# Patient Record
Sex: Male | Born: 1950
Health system: Southern US, Community
[De-identification: ages and names within clinical notes are randomized; demographics above are authoritative.]

## PROBLEM LIST (undated history)

## (undated) DIAGNOSIS — E669 Obesity, unspecified: Secondary | ICD-10-CM

## (undated) DIAGNOSIS — T7840XA Allergy, unspecified, initial encounter: Secondary | ICD-10-CM

## (undated) DIAGNOSIS — G4730 Sleep apnea, unspecified: Secondary | ICD-10-CM

## (undated) DIAGNOSIS — Z8679 Personal history of other diseases of the circulatory system: Secondary | ICD-10-CM

## (undated) DIAGNOSIS — N390 Urinary tract infection, site not specified: Secondary | ICD-10-CM

## (undated) DIAGNOSIS — M797 Fibromyalgia: Secondary | ICD-10-CM

## (undated) DIAGNOSIS — I951 Orthostatic hypotension: Secondary | ICD-10-CM

## (undated) DIAGNOSIS — R55 Syncope and collapse: Secondary | ICD-10-CM

## (undated) DIAGNOSIS — E119 Type 2 diabetes mellitus without complications: Secondary | ICD-10-CM

## (undated) DIAGNOSIS — I10 Essential (primary) hypertension: Secondary | ICD-10-CM

## (undated) DIAGNOSIS — R42 Dizziness and giddiness: Secondary | ICD-10-CM

## (undated) DIAGNOSIS — Z7901 Long term (current) use of anticoagulants: Secondary | ICD-10-CM

## (undated) DIAGNOSIS — Z86718 Personal history of other venous thrombosis and embolism: Secondary | ICD-10-CM

## (undated) DIAGNOSIS — H269 Unspecified cataract: Secondary | ICD-10-CM

## (undated) DIAGNOSIS — K8010 Calculus of gallbladder with chronic cholecystitis without obstruction: Secondary | ICD-10-CM

## (undated) DIAGNOSIS — N183 Chronic kidney disease, stage 3 unspecified: Secondary | ICD-10-CM

## (undated) DIAGNOSIS — I509 Heart failure, unspecified: Secondary | ICD-10-CM

## (undated) DIAGNOSIS — G629 Polyneuropathy, unspecified: Secondary | ICD-10-CM

## (undated) DIAGNOSIS — M069 Rheumatoid arthritis, unspecified: Secondary | ICD-10-CM

## (undated) DIAGNOSIS — Z86010 Personal history of colonic polyps: Secondary | ICD-10-CM

## (undated) DIAGNOSIS — I428 Other cardiomyopathies: Secondary | ICD-10-CM

## (undated) DIAGNOSIS — J449 Chronic obstructive pulmonary disease, unspecified: Secondary | ICD-10-CM

## (undated) DIAGNOSIS — R9431 Abnormal electrocardiogram [ECG] [EKG]: Secondary | ICD-10-CM

## (undated) DIAGNOSIS — M519 Unspecified thoracic, thoracolumbar and lumbosacral intervertebral disc disorder: Secondary | ICD-10-CM

## (undated) DIAGNOSIS — K529 Noninfective gastroenteritis and colitis, unspecified: Secondary | ICD-10-CM

## (undated) DIAGNOSIS — K8020 Calculus of gallbladder without cholecystitis without obstruction: Secondary | ICD-10-CM

## (undated) DIAGNOSIS — E860 Dehydration: Secondary | ICD-10-CM

## (undated) DIAGNOSIS — N200 Calculus of kidney: Secondary | ICD-10-CM

## (undated) HISTORY — DX: Chronic obstructive pulmonary disease, unspecified: J44.9

## (undated) HISTORY — DX: Long term (current) use of anticoagulants: Z79.01

## (undated) HISTORY — DX: Other cardiomyopathies: I42.8

## (undated) HISTORY — DX: Chronic kidney disease, stage 3 unspecified: N18.30

## (undated) HISTORY — DX: Sleep apnea, unspecified: G47.30

## (undated) HISTORY — PX: TOE AMPUTATION: SHX809

## (undated) HISTORY — DX: Unspecified thoracic, thoracolumbar and lumbosacral intervertebral disc disorder: M51.9

## (undated) HISTORY — DX: Essential (primary) hypertension: I10

## (undated) HISTORY — PX: EYE SURGERY: SHX253

## (undated) HISTORY — DX: Obesity, unspecified: E66.9

## (undated) HISTORY — DX: Chronic kidney disease, stage 3 (moderate): N18.3

## (undated) HISTORY — DX: Urinary tract infection, site not specified: N39.0

## (undated) HISTORY — DX: Personal history of other venous thrombosis and embolism: Z86.718

## (undated) HISTORY — DX: Calculus of kidney: N20.0

## (undated) HISTORY — DX: Personal history of colonic polyps: Z86.010

## (undated) HISTORY — PX: OTHER SURGICAL HISTORY: SHX169

## (undated) HISTORY — DX: Calculus of gallbladder without cholecystitis without obstruction: K80.20

## (undated) HISTORY — PX: VARICOSE VEIN SURGERY: SHX832

## (undated) HISTORY — DX: Abnormal electrocardiogram (ECG) (EKG): R94.31

## (undated) HISTORY — DX: Rheumatoid arthritis, unspecified: M06.9

## (undated) HISTORY — DX: Calculus of gallbladder with chronic cholecystitis without obstruction: K80.10

## (undated) HISTORY — DX: Dehydration: E86.0

## (undated) HISTORY — DX: Heart failure, unspecified: I50.9

## (undated) HISTORY — DX: Type 2 diabetes mellitus without complications: E11.9

## (undated) HISTORY — PX: SPINE SURGERY: SHX786

## (undated) HISTORY — PX: HERNIA REPAIR: SHX51

## (undated) HISTORY — DX: Syncope and collapse: R55

## (undated) HISTORY — DX: Polyneuropathy, unspecified: G62.9

## (undated) HISTORY — DX: Allergy, unspecified, initial encounter: T78.40XA

## (undated) HISTORY — DX: Orthostatic hypotension: I95.1

## (undated) HISTORY — DX: Dizziness and giddiness: R42

## (undated) HISTORY — DX: Noninfective gastroenteritis and colitis, unspecified: K52.9

## (undated) HISTORY — PX: BRAIN SURGERY: SHX531

## (undated) HISTORY — DX: Fibromyalgia: M79.7

## (undated) HISTORY — PX: UMBILICAL HERNIA REPAIR: SHX196

## (undated) HISTORY — DX: Unspecified cataract: H26.9

---

## 1995-12-27 HISTORY — PX: PILONIDAL CYST EXCISION: SHX744

## 2001-12-26 DIAGNOSIS — I428 Other cardiomyopathies: Secondary | ICD-10-CM

## 2001-12-26 HISTORY — DX: Other cardiomyopathies: I42.8

## 2002-10-01 ENCOUNTER — Inpatient Hospital Stay (HOSPITAL_COMMUNITY): Admission: EM | Admit: 2002-10-01 | Discharge: 2002-10-06 | Payer: Self-pay | Admitting: Cardiovascular Disease

## 2009-04-28 ENCOUNTER — Encounter: Admission: RE | Admit: 2009-04-28 | Discharge: 2009-04-28 | Payer: Self-pay | Admitting: Family Medicine

## 2010-12-13 ENCOUNTER — Encounter (HOSPITAL_COMMUNITY)
Admission: RE | Admit: 2010-12-13 | Discharge: 2011-01-12 | Payer: Self-pay | Source: Home / Self Care | Attending: Family Medicine | Admitting: Family Medicine

## 2011-01-17 ENCOUNTER — Encounter: Payer: Self-pay | Admitting: Neurological Surgery

## 2011-05-13 NOTE — Discharge Summary (Signed)
NAME:  Mitchell Parker, Mitchell Parker                           ACCOUNT NO.:  [id number]   MEDICAL RECORD NO.:  [id number]                   PATIENT TYPE:  INP   LOCATION:  4711                                 FACILITY:  [facility]   PHYSICIAN:  Joellyn Rued, P.A. [facility]              DATE OF BIRTH:  1951/01/26   DATE OF ADMISSION:  10/01/2002  DATE OF DISCHARGE:  10/06/2002                           DISCHARGE SUMMARY - REFERRING   BRIEF HISTORY:  The patient is a 60 year old white male who recently has  developed dyspnea on exertion.  He was admitted by Dr. Olena Leatherwood to [facility] on 09/30/2002 secndary to congestive heart failure.  He responded  to diuresis.  We were asked to consult in regards to congestive heart  failure.  Dr. Diona Browner saw him on 09/30/2002.  It was noted that an  echocardiogram at [facility] showed significant left ventricular  dysfunction and ventricular enlargement.  It was noted as a very technical  physical study secondary to his body habitus.  He had a heart  catheterization approximately 10 years ago at [facility] and did not  show any coronary artery disease.  He has not had any cardiac testing since  that time.   He has a medical history of obesity, hypertension, possible sleep apnea and  type II diabetes.  He was transferred to [facility] for right and left  heart catheterization.   LABORATORY DATA:  At [facility] fasting lipid showed a total cholesterol  of 161, triglycerides 137, HDL 33, LDL 101.  Sodium 140, potassium 4.0.  BUN  15, creatinine 1.1.  Glucose 181.  BMP on 10/05/2002 was 137, H&H was 16.5  and 49.2, normal indices.  Platelets 192, WBC 6.0.  PT 13.3 with an INR of  1.0 on 10/01/2002.  On 10/05/2002 INR was 0.9.  Chest x-ray at [facility]  showed cardiac enlargement, widened superior mediastinum, pulmonary venous  hypertension, pulmonary vascular congestion with mild interstitial pattern,  probable pleural effusion.  Electrocardiogram  showed normal sinus rhythm,  left atrial enlargement.   LABS FROM [facility]:  H&H of 14.6 and 43.8, normal indices.  Platelets 201, WBC 7.9.  CK and Troponin negative for myocardial infarction.   HOSPITAL COURSE:  This patient was transferred to [facility] on  _____________.  Medications were adjusted for congestive heart failure.  Diabetes coordinator also saw the patient in consultation.  Right and left  heart catheterization was performed on 10/03/2002 by Dr. Geralynn Rile.  RA  pressure was 1915 with a mean of 13, RV 46/10 with a mean of 16, PA 46/20  with a mean of 28, PCW 27 and 26 with a mean of 20, LV 153/25 with a mean of  28.  There was no aortic stenosis on pullback.  Ejection fraction was 17%,  1+ MR and global hypokinesis.  He had a 30% distal right coronary artery and  a 20% distal left anterior descending, 40% diagonal 1.  It was felt that he  had dilated cardiomyopathy and Coumadin was begun.  Case management assisted  the patient and the family with pursuing disability.  Pastoral care was also  involved.  Over the next several days medications were adjusted with  continuing diuresis.  By 10/05/2002, his breathing had improved  significantly.  BMP was repeated and was 137.  It was noted at the time of  discharge on 10/05/2002 that BMP was still pending.  After review Dr.  Diona Browner felt that he could be discharged home.   DISCHARGE DIAGNOSES:  1. Congestive heart failure secondary to dilated cardiomyopathy.  2. Nonobstructive coronary artery disease.  3. Hyperlipidemia with a decreased HDL.  4. Non-insulin dependent diabetes with hypoglycemia while in the hospital.   DISPOSITION:  The patient is discharged home.  He is asked to take Coumadin  5 mg 1 1/2 tablets q.d., coated aspirin 81 mg q.d., Coreg 12.50 mg 1/2  tablet b.i.d., Aldactone 25 mg q.d., Lasix 40 mg q.d., Zocor 10 mg q. h.s.,  Glucovance as previously, Enalapril 20 mg q.d.  He was asked to  maintain a  low salt diet, to weigh daily and record, bring all medications and weights  to the office.  He will have a PT and INR checked later this week or early  next week at our [place] office.  He will also call on Monday to arrange a 2  week follow-up appointment with Dr. Diona Browner and he was seen Dr. Olena Leatherwood in  regards to following his sugars.  Consideration should also be given to  checking the hemoglobin A1C and a TSH as an outpatient.  With the initiation  of Zocor fasting lipids and liver function tests will need to be checked in  approximately 6 weeks.  I anticipate titrating up of his Coreg and other  medications as his blood pressure tolerates.  Dr. Diona Browner is to look into  referrals at [facility] in regards to the family's questions.                                               Joellyn Rued, P.A. [facility]    EW/MEDQ  D:  10/06/2002  T:  10/08/2002  Job:  [id number]   cc:   Ross Marcus, M.D. [facility]

## 2011-05-13 NOTE — Cardiovascular Report (Signed)
NAME:  Mitchell Parker, Mitchell Parker                           ACCOUNT NO.:  [id number]   MEDICAL RECORD NO.:  [id number]                   PATIENT TYPE:  INP   LOCATION:  4711                                 FACILITY:  [facility]   PHYSICIAN:  Salvadore Farber, M.D. [facility]         DATE OF BIRTH:  1951-07-21   DATE OF PROCEDURE:  10/03/2002  DATE OF DISCHARGE:                              CARDIAC CATHETERIZATION   PROCEDURES:  Left and right heart catheterization, left ventriculogram,  coronary angiography.   INDICATIONS:  The patient is a 60 year old gentleman with recently diagnosed  congestive heart failure with profoundly reduced LV systolic function. He is  referred for right heart catheterization to assess his hemodynamic status  and coronary angiography to exclude an ischemic etiology to his heart  failure.   DIAGNOSTIC TECHNIQUE:  Informed consent was obtained. Under 2% lidocaine  local anesthesia, a 6 French sheath was placed in the right femoral artery,  an 8 French sheath in the right femoral vein using the modified Seldinger  technique.  A balloon-tipped catheter was advanced through the right heart  chambers into pulmonary capillary wedge position. Pressures were measured.  The cardiac output was measured by thermodilution. Coronary angiography was  then performed using JL4 and JR4 catheters. Finally, a pigtail catheter was  advanced into the left ventricle. Pressures were measured.  Ventriculography  was performed by power injection.   FINDINGS:  1. Hemodynamics:  RA 19/15/13, RV 46/10/16, PA 46/20/28, PCW 27/26/20, LV     153/15/28, cardiac output 6.8, cardiac index 2.6, SBR 12/12.  2. No aortic stenosis on pullback.  3. Mitral regurgitation 1+.  4. Dilated left ventricle with global hypokinesis, ejection fraction 17%.  5. Left main:  Angiographically normal.  6. LAD:  The LAD is a large vessel giving rise to two large diagonal     branches.  The first diagonal has a 40% stenosis  proximally. The distal     LAD has a 20% stenosis.  7. Circumflex:  Large vessel giving rise to two obtuse marginal branches.     It is angiographically normal.  8. RCA:  Moderate sized vessel.  There is a 30% stenosis of the mid vessel.    IMPRESSION/PLAN:  The patient has a nonischemic cardiomyopathy with severely  impaired LV systolic function.  Left ventricular pressures as well as right  atrial pressures remain markedly elevated with preserved cardiac output.  Blood pressure remains hypertensive. Will increase ACE inhibitor and  continue aggressive diuresis. In addition, we will begin systemic  anticoagulation with Coumadin.                                                 Salvadore Farber, M.D. [facility]    WED/MEDQ  D:  10/03/2002  T:  10/07/2002  Job:  [id number]   cc:   Jonelle Sidle, M.D. [facility]   Xaje Hasanaj

## 2011-10-21 ENCOUNTER — Inpatient Hospital Stay (HOSPITAL_COMMUNITY)
Admission: EM | Admit: 2011-10-21 | Discharge: 2011-10-28 | DRG: 554 | Disposition: A | Discharge status: Skilled Nursing Facility | Payer: Medicare Other | Attending: Internal Medicine | Admitting: Internal Medicine

## 2011-10-21 ENCOUNTER — Emergency Department (HOSPITAL_COMMUNITY): Payer: Medicare Other

## 2011-10-21 DIAGNOSIS — I129 Hypertensive chronic kidney disease with stage 1 through stage 4 chronic kidney disease, or unspecified chronic kidney disease: Secondary | ICD-10-CM | POA: Diagnosis present

## 2011-10-21 DIAGNOSIS — R112 Nausea with vomiting, unspecified: Secondary | ICD-10-CM | POA: Diagnosis present

## 2011-10-21 DIAGNOSIS — Z6841 Body Mass Index (BMI) 40.0 and over, adult: Secondary | ICD-10-CM

## 2011-10-21 DIAGNOSIS — E785 Hyperlipidemia, unspecified: Secondary | ICD-10-CM | POA: Diagnosis present

## 2011-10-21 DIAGNOSIS — R748 Abnormal levels of other serum enzymes: Secondary | ICD-10-CM | POA: Diagnosis present

## 2011-10-21 DIAGNOSIS — N179 Acute kidney failure, unspecified: Secondary | ICD-10-CM | POA: Diagnosis present

## 2011-10-21 DIAGNOSIS — M069 Rheumatoid arthritis, unspecified: Secondary | ICD-10-CM | POA: Diagnosis present

## 2011-10-21 DIAGNOSIS — J4489 Other specified chronic obstructive pulmonary disease: Secondary | ICD-10-CM | POA: Diagnosis present

## 2011-10-21 DIAGNOSIS — E119 Type 2 diabetes mellitus without complications: Secondary | ICD-10-CM | POA: Diagnosis present

## 2011-10-21 DIAGNOSIS — D509 Iron deficiency anemia, unspecified: Secondary | ICD-10-CM | POA: Diagnosis present

## 2011-10-21 DIAGNOSIS — G4733 Obstructive sleep apnea (adult) (pediatric): Secondary | ICD-10-CM | POA: Diagnosis present

## 2011-10-21 DIAGNOSIS — N184 Chronic kidney disease, stage 4 (severe): Secondary | ICD-10-CM | POA: Diagnosis present

## 2011-10-21 DIAGNOSIS — J449 Chronic obstructive pulmonary disease, unspecified: Secondary | ICD-10-CM | POA: Diagnosis present

## 2011-10-21 DIAGNOSIS — D72829 Elevated white blood cell count, unspecified: Secondary | ICD-10-CM | POA: Diagnosis present

## 2011-10-21 DIAGNOSIS — M130 Polyarthritis, unspecified: Principal | ICD-10-CM | POA: Diagnosis present

## 2011-10-21 DIAGNOSIS — M109 Gout, unspecified: Secondary | ICD-10-CM | POA: Diagnosis present

## 2011-10-21 DIAGNOSIS — K8020 Calculus of gallbladder without cholecystitis without obstruction: Secondary | ICD-10-CM | POA: Diagnosis present

## 2011-10-21 LAB — CBC
HCT: 28.7 % — ABNORMAL LOW (ref 39.0–52.0)
Hemoglobin: 9.8 g/dL — ABNORMAL LOW (ref 13.0–17.0)
MCH: 28.7 pg (ref 26.0–34.0)
MCHC: 34.1 g/dL (ref 30.0–36.0)
MCV: 83.9 fL (ref 78.0–100.0)
Platelets: 259 10*3/uL (ref 150–400)
RBC: 3.42 MIL/uL — ABNORMAL LOW (ref 4.22–5.81)
RDW: 13.9 % (ref 11.5–15.5)
WBC: 13.7 10*3/uL — ABNORMAL HIGH (ref 4.0–10.5)

## 2011-10-21 LAB — COMPREHENSIVE METABOLIC PANEL
ALT: 23 U/L (ref 0–53)
AST: 44 U/L — ABNORMAL HIGH (ref 0–37)
Albumin: 2.3 g/dL — ABNORMAL LOW (ref 3.5–5.2)
Alkaline Phosphatase: 80 U/L (ref 39–117)
BUN: 43 mg/dL — ABNORMAL HIGH (ref 6–23)
CO2: 24 mEq/L (ref 19–32)
Calcium: 8.6 mg/dL (ref 8.4–10.5)
Chloride: 90 mEq/L — ABNORMAL LOW (ref 96–112)
Creatinine, Ser: 1.74 mg/dL — ABNORMAL HIGH (ref 0.50–1.35)
GFR calc Af Amer: 48 mL/min — ABNORMAL LOW (ref >90)
GFR calc non Af Amer: 41 mL/min — ABNORMAL LOW (ref >90)
Glucose, Bld: 180 mg/dL — ABNORMAL HIGH (ref 70–99)
Potassium: 3.9 mEq/L (ref 3.5–5.1)
Sodium: 125 mEq/L — ABNORMAL LOW (ref 135–145)
Total Bilirubin: 0.5 mg/dL (ref 0.3–1.2)
Total Protein: 6.5 g/dL (ref 6.0–8.3)

## 2011-10-21 LAB — LIPASE, BLOOD: Lipase: 188 U/L — ABNORMAL HIGH (ref 11–59)

## 2011-10-21 LAB — DIFFERENTIAL
Basophils Absolute: 0 10*3/uL (ref 0.0–0.1)
Basophils Relative: 0 % (ref 0–1)
Eosinophils Absolute: 0.1 10*3/uL (ref 0.0–0.7)
Eosinophils Relative: 1 % (ref 0–5)
Lymphocytes Relative: 5 % — ABNORMAL LOW (ref 12–46)
Lymphs Abs: 0.7 10*3/uL (ref 0.7–4.0)
Monocytes Absolute: 1.5 10*3/uL — ABNORMAL HIGH (ref 0.1–1.0)
Monocytes Relative: 11 % (ref 3–12)
Neutro Abs: 11.4 10*3/uL — ABNORMAL HIGH (ref 1.7–7.7)
Neutrophils Relative %: 83 % — ABNORMAL HIGH (ref 43–77)

## 2011-10-21 LAB — URINE MICROSCOPIC-ADD ON

## 2011-10-21 LAB — URINALYSIS, ROUTINE W REFLEX MICROSCOPIC
Bilirubin Urine: NEGATIVE
Glucose, UA: NEGATIVE mg/dL
Ketones, ur: NEGATIVE mg/dL
Leukocytes, UA: NEGATIVE
Nitrite: NEGATIVE
Protein, ur: NEGATIVE mg/dL
Specific Gravity, Urine: 1.014 (ref 1.005–1.030)
Urobilinogen, UA: 1 mg/dL (ref 0.0–1.0)
pH: 5 (ref 5.0–8.0)

## 2011-10-21 LAB — LACTIC ACID, PLASMA: Lactic Acid, Venous: 1.3 mmol/L (ref 0.5–2.2)

## 2011-10-22 ENCOUNTER — Inpatient Hospital Stay (HOSPITAL_COMMUNITY): Payer: Medicare Other

## 2011-10-22 DIAGNOSIS — K8020 Calculus of gallbladder without cholecystitis without obstruction: Secondary | ICD-10-CM

## 2011-10-22 DIAGNOSIS — R197 Diarrhea, unspecified: Secondary | ICD-10-CM

## 2011-10-22 DIAGNOSIS — R109 Unspecified abdominal pain: Secondary | ICD-10-CM

## 2011-10-22 LAB — URINALYSIS, ROUTINE W REFLEX MICROSCOPIC
Bilirubin Urine: NEGATIVE
Glucose, UA: NEGATIVE mg/dL
Ketones, ur: NEGATIVE mg/dL
Leukocytes, UA: NEGATIVE
Nitrite: NEGATIVE
Protein, ur: NEGATIVE mg/dL
Specific Gravity, Urine: 1.013 (ref 1.005–1.030)
Urobilinogen, UA: 1 mg/dL (ref 0.0–1.0)
pH: 5 (ref 5.0–8.0)

## 2011-10-22 LAB — MAGNESIUM
Magnesium: 2.1 mg/dL (ref 1.5–2.5)
Magnesium: 1.9 mg/dL (ref 1.5–2.5)

## 2011-10-22 LAB — GLUCOSE, CAPILLARY
Glucose-Capillary: 235 mg/dL — ABNORMAL HIGH (ref 70–99)
Glucose-Capillary: 251 mg/dL — ABNORMAL HIGH (ref 70–99)
Glucose-Capillary: 229 mg/dL — ABNORMAL HIGH (ref 70–99)
Glucose-Capillary: 249 mg/dL — ABNORMAL HIGH (ref 70–99)

## 2011-10-22 LAB — BASIC METABOLIC PANEL
BUN: 46 mg/dL — ABNORMAL HIGH (ref 6–23)
CO2: 23 mEq/L (ref 19–32)
Calcium: 8.6 mg/dL (ref 8.4–10.5)
Chloride: 90 mEq/L — ABNORMAL LOW (ref 96–112)
Creatinine, Ser: 1.71 mg/dL — ABNORMAL HIGH (ref 0.50–1.35)
GFR calc Af Amer: 49 mL/min — ABNORMAL LOW (ref >90)
GFR calc non Af Amer: 42 mL/min — ABNORMAL LOW (ref >90)
Glucose, Bld: 248 mg/dL — ABNORMAL HIGH (ref 70–99)
Potassium: 3.8 mEq/L (ref 3.5–5.1)
Sodium: 125 mEq/L — ABNORMAL LOW (ref 135–145)

## 2011-10-22 LAB — HEPATIC FUNCTION PANEL
ALT: 22 U/L (ref 0–53)
AST: 39 U/L — ABNORMAL HIGH (ref 0–37)
Albumin: 2.1 g/dL — ABNORMAL LOW (ref 3.5–5.2)
Alkaline Phosphatase: 84 U/L (ref 39–117)
Bilirubin, Direct: 0.2 mg/dL (ref 0.0–0.3)
Indirect Bilirubin: 0.2 mg/dL — ABNORMAL LOW (ref 0.3–0.9)
Total Bilirubin: 0.4 mg/dL (ref 0.3–1.2)
Total Protein: 6.4 g/dL (ref 6.0–8.3)

## 2011-10-22 LAB — LIPID PANEL
Cholesterol: 117 mg/dL (ref 0–200)
HDL: 10 mg/dL — ABNORMAL LOW (ref >39)
LDL Cholesterol: 68 mg/dL (ref 0–99)
Total CHOL/HDL Ratio: 11.7 RATIO
Triglycerides: 194 mg/dL — ABNORMAL HIGH (ref <150)
VLDL: 39 mg/dL (ref 0–40)

## 2011-10-22 LAB — AMYLASE: Amylase: 108 U/L — ABNORMAL HIGH (ref 0–105)

## 2011-10-22 LAB — URINE MICROSCOPIC-ADD ON

## 2011-10-22 LAB — PRO B NATRIURETIC PEPTIDE: Pro B Natriuretic peptide (BNP): 1210 pg/mL — ABNORMAL HIGH (ref 0–125)

## 2011-10-22 LAB — CBC
HCT: 29.6 % — ABNORMAL LOW (ref 39.0–52.0)
Hemoglobin: 10 g/dL — ABNORMAL LOW (ref 13.0–17.0)
MCH: 28.3 pg (ref 26.0–34.0)
MCHC: 33.8 g/dL (ref 30.0–36.0)
MCV: 83.9 fL (ref 78.0–100.0)
Platelets: 271 10*3/uL (ref 150–400)
RBC: 3.53 MIL/uL — ABNORMAL LOW (ref 4.22–5.81)
RDW: 14.1 % (ref 11.5–15.5)
WBC: 13.3 10*3/uL — ABNORMAL HIGH (ref 4.0–10.5)

## 2011-10-22 LAB — IRON AND TIBC
Iron: 11 ug/dL — ABNORMAL LOW (ref 42–135)
Saturation Ratios: 7 % — ABNORMAL LOW (ref 20–55)
TIBC: 147 ug/dL — ABNORMAL LOW (ref 215–435)
UIBC: 136 ug/dL (ref 125–400)

## 2011-10-22 LAB — FOLATE: Folate: 8.1 ng/mL

## 2011-10-22 LAB — LIPASE, BLOOD: Lipase: 231 U/L — ABNORMAL HIGH (ref 11–59)

## 2011-10-22 LAB — SODIUM, URINE, RANDOM: Sodium, Ur: 32 mEq/L

## 2011-10-22 LAB — TSH: TSH: 1.594 u[IU]/mL (ref 0.350–4.500)

## 2011-10-22 LAB — OSMOLALITY: Osmolality: 282 mOsm/kg (ref 275–300)

## 2011-10-22 LAB — OSMOLALITY, URINE: Osmolality, Ur: 390 mOsm/kg (ref 390–1090)

## 2011-10-22 LAB — FERRITIN: Ferritin: 622 ng/mL — ABNORMAL HIGH (ref 22–322)

## 2011-10-22 LAB — URIC ACID: Uric Acid, Serum: 12.5 mg/dL — ABNORMAL HIGH (ref 4.0–7.8)

## 2011-10-22 LAB — VITAMIN B12: Vitamin B-12: 400 pg/mL (ref 211–911)

## 2011-10-22 LAB — CREATININE, URINE, RANDOM: Creatinine, Urine: 86.3 mg/dL

## 2011-10-22 NOTE — H&P (Signed)
[name], WOMAC NO.:  [id number]  MEDICAL RECORD NO.:  [id number]  LOCATION:  WLED                         FACILITY:  [facility]  PHYSICIAN:  Gery Pray, MD      DATE OF BIRTH:  1951-11-27  DATE OF ADMISSION:  10/21/2011 DATE OF DISCHARGE:                             HISTORY & PHYSICAL   PRIMARY CARE PHYSICIAN:  Dr. Loyal Buba.  CODE STATUS:  Full code.  The patient goes to team V.  CHIEF COMPLAINT:  Nausea and vomiting.  HISTORY OF PRESENT ILLNESS:  This is a 60 year old gentleman, who developed nausea, vomiting, and abdominal pain from 10 days ago.  He states that his nausea was quite severe, emesis was green in color.  No hematemesis.  He went on to develop severe diarrhea and no evidence of melena. He denied abdominal pain.  He had fevers.  No chills.  No burning on urination.  He may or may not have had a cough.  He went to the [facility] where he was admitted by his PCP.  He was admitted kept there for the past 7 days.  He received IV fluid hydration.  While he was there, he started developing aches all over in his body and every joint.  He could hardly move around.  Studies were done.  He was shown to have a cholelithiasis.  He saw surgeon.  Discussion was on the way for having his gallbladder removed; however, there developed a question of the hospital being infected with some flesh eating bacteria and the patient left AMA yesterday.  He states, however, his symptoms have remained.  He still has a fever.  He still has nausea and vomiting.  His diarrhea has improved.  He still has generalized body aches.  He came to ER.  History obtained from the patient and records from [place].  The patient is a fair historian.  Patient states that he does not have chest pains.  He states he never had CHF.  He states he has no palpitation. He states his cardiomyopathy was from a viral infection in the past.  REVIEW OF SYSTEMS:  All 10 point systems  reviewed and negative except as noted in the HPI.  PAST MEDICAL HISTORY:  Includes: 1. Chronic fluid overload. 2. Chronic kidney disease stage 4. 3. Rheumatoid arthritis. 4. COPD. 5. Hypertension. 6. Diabetes mellitus. 7. Cardiomyopathy. 8. Morbid obesity. 9. Obstructive sleep apnea.  PAST SURGICAL HISTORY:  Umbilical hernia.  MEDICATIONS: 1. Coreg 25 mg p.o. b.i.d. 2. Lasix 80 mg p.o. b.i.d. 3. Lisinopril 10 mg p.o. b.i.d. 4. Spironolactone 25 mg p.o. b.i.d. 5. Pravastatin 80 mg daily. 6. Prilosec daily. 7. Gabapentin 300 mg p.o. b.i.d. 8. Novolin N 96 units in the a.m. and 99 units in the night. 9. NovoLog 50 units after each meal. 10.Hydrocodone 7.5 q.6 hours p.r.n.  ALLERGIES:  Patient is allergic to [facility].  SOCIAL HISTORY:  Negative for tobacco, alcohol, or illicit drugs.  No home oxygen.  He uses a walker.  He lives with his wife.  FAMILY HISTORY:  Patient denies.  No diabetes or hypertension.  PHYSICAL EXAMINATION:  VITAL SIGNS:  Blood pressure 109/49, pulse 89,  respirations 18, temperature initially was 97.5, repeat rectal temperature was 101.0,  sat 99% on room air. GENERAL:  Obese, fluid overloaded-appearing male.  Alert and oriented. EYES:  Pink conjunctiva.  No jaundice.  ENT, moist oral mucosa.  Trachea midline. NECK:  Supple. LUNGS:  Clear to auscultation.  No wheeze.  No use of accessory muscles. CARDIOVASCULAR:  Regular rate and rhythm without murmurs, rigors, or gallops.  Unable to assess JVD secondary to body habitus. ABDOMEN:  Obese and soft.  No tenderness elicited.  Unable to assess any organomegaly due to the patient body habitus. NEURO:  Cranial nerves II through XII appear grossly intact. MUSCULOSKELETAL:  Strength 5/5 in all extremities.  Patient appears to have just generalized anasarca mild-to-moderate, fluid retention. SKIN:  No subcutaneous crepitation.  Patient has evidence of old rashes on the legs.  LABS:  UA is negative.  Chest  x-ray shows cardiac enlargement, no active pulmonary disease.  CT abdomen and pelvis, cholelithiasis, no specific acute process demonstrated.  Lactic acid 1.3.  Sodium 125, potassium 3.9, chloride 90, CO2 24, glucose 118, BUN 43, creatinine 1.74.  LFTs are normal with exception of AST, which is 44.  White blood count 13.7, hemoglobin 9.8, platelets 259.  Creatinine from [facility] was 2.53, sodium was 1.33.  Ultrasound shows cholelithiasis with evidence of acute cholecystitis.  Hemoglobin was 12.0.  ASSESSMENT AND PLAN: 1. Persistent gastroenteritis. 2. Fever. 3. Cholelithiasis.  Patient presents to [facility] with fevers,     present after almost a week of hospitalization at outside facility.     For this reason, patient will be admitted.  Blood cultures, urine     cultures, and stool cultures will be ordered.  I will go ahead and     order HIDA scan to evaluate the function of patient's gallbladder.     The ultrasound reports from his prior hospital notes do not show     any evidence of acute cholecystitis.  Patient's abdominal exam is     benign; however, patient reports persistent nausea and vomiting.     I will order Protonix and Phenergan p.r.n.  Clear liquid diet to be     advanced as tolerated.  Defer to a.m. team if they feel like GI     needs to be consulted.  We will start him on empiric     Rocephin , simply because patient     remains febrile after coming from different institution.  (An agrument can be made for no     antibiotics have been started as there is not a clearcut evidence     reason for patient's fever); therefore, will culture, evaluate     gallbladder, and obtain stool cultures. 4. Hyponatremia. 5. Chronic fluid overload.  Patient will be admitted.  He will be     fluid restricted to 1.5 liters per day.  We will check urine     sodium, osmolarity, and TSH.  Patient was on high dose of Lasix,     could this be accounting for patient's hyponatremia.  We will      repeat in the morning and adjust treatment based on lab findings.     We will also check a TSH. 6. Cardiomyopathy.  I am going to go ahead and get a baseline echo if     patient needs cholecystitis with his fluid overload, is prudent to     evaluate patient's obesity and evaluation of the patient's heart     rate.  Patient's fluid overload could be either related to cardiac     reasons or to renal issues. 7. Morbid obesity.  BMI greater than 40. 8. Obstructive sleep apnea.  Bariatric bed will be needed.  CPAP     machine will be ordered.  Nebulizers will be ordered p.r.n. 9. Diabetes mellitus. 10.Hypertension. 11.Dyslipidemia.  Resume patient's home medications including     lisinopril.  Patient's creatinine is improving despite being on the     lisinopril.  Continue to monitor creatinine.  We will place patient     on ADA diet.  Resume home medications and sliding scale insulin. 12.Rheumatoid arthritis.  Patient does complain of a generalized joint     pains; however, on review medications from his previous     hospitalization, there was no evidence of any medication for     rheumatoid arthritis.  Patient states that he is usually on the     medication for his rheumatoid arthritis.  As I have advised patient     to have his wife bring in the medication he is on, and this will be     restarted.  Patient is assured it could be results of not being on     his home medications, p.r.n. pain medication will be ordered.          ______________________________ Gery Pray, MD     DC/MEDQ  D:  10/22/2011  T:  10/22/2011  Job:  [id number]  Electronically Signed by Gery Pray MD on 10/22/2011 06:29:27 AM

## 2011-10-23 DIAGNOSIS — I059 Rheumatic mitral valve disease, unspecified: Secondary | ICD-10-CM

## 2011-10-23 LAB — CBC
HCT: 29.9 % — ABNORMAL LOW (ref 39.0–52.0)
Hemoglobin: 10.1 g/dL — ABNORMAL LOW (ref 13.0–17.0)
MCH: 28.7 pg (ref 26.0–34.0)
MCHC: 33.8 g/dL (ref 30.0–36.0)
MCV: 84.9 fL (ref 78.0–100.0)
Platelets: 322 10*3/uL (ref 150–400)
RBC: 3.52 MIL/uL — ABNORMAL LOW (ref 4.22–5.81)
RDW: 14.2 % (ref 11.5–15.5)
WBC: 16.4 10*3/uL — ABNORMAL HIGH (ref 4.0–10.5)

## 2011-10-23 LAB — DIFFERENTIAL
Basophils Absolute: 0 10*3/uL (ref 0.0–0.1)
Basophils Relative: 0 % (ref 0–1)
Eosinophils Absolute: 0.1 10*3/uL (ref 0.0–0.7)
Eosinophils Relative: 0 % (ref 0–5)
Lymphocytes Relative: 4 % — ABNORMAL LOW (ref 12–46)
Lymphs Abs: 0.7 10*3/uL (ref 0.7–4.0)
Monocytes Absolute: 1.5 10*3/uL — ABNORMAL HIGH (ref 0.1–1.0)
Monocytes Relative: 9 % (ref 3–12)
Neutro Abs: 14.2 10*3/uL — ABNORMAL HIGH (ref 1.7–7.7)
Neutrophils Relative %: 87 % — ABNORMAL HIGH (ref 43–77)

## 2011-10-23 LAB — URINE CULTURE
Colony Count: 6000
Colony Count: NO GROWTH
Culture  Setup Time: 201210270550
Culture  Setup Time: 201210271308
Culture: NO GROWTH
Special Requests: NEGATIVE

## 2011-10-23 LAB — COMPREHENSIVE METABOLIC PANEL
ALT: 22 U/L (ref 0–53)
AST: 32 U/L (ref 0–37)
Albumin: 2.1 g/dL — ABNORMAL LOW (ref 3.5–5.2)
Alkaline Phosphatase: 89 U/L (ref 39–117)
BUN: 43 mg/dL — ABNORMAL HIGH (ref 6–23)
CO2: 26 mEq/L (ref 19–32)
Calcium: 8.7 mg/dL (ref 8.4–10.5)
Chloride: 93 mEq/L — ABNORMAL LOW (ref 96–112)
Creatinine, Ser: 1.52 mg/dL — ABNORMAL HIGH (ref 0.50–1.35)
GFR calc Af Amer: 57 mL/min — ABNORMAL LOW (ref >90)
GFR calc non Af Amer: 49 mL/min — ABNORMAL LOW (ref >90)
Glucose, Bld: 221 mg/dL — ABNORMAL HIGH (ref 70–99)
Potassium: 3.9 mEq/L (ref 3.5–5.1)
Sodium: 127 mEq/L — ABNORMAL LOW (ref 135–145)
Total Bilirubin: 0.4 mg/dL (ref 0.3–1.2)
Total Protein: 6.4 g/dL (ref 6.0–8.3)

## 2011-10-23 LAB — GLUCOSE, CAPILLARY
Glucose-Capillary: 214 mg/dL — ABNORMAL HIGH (ref 70–99)
Glucose-Capillary: 219 mg/dL — ABNORMAL HIGH (ref 70–99)
Glucose-Capillary: 224 mg/dL — ABNORMAL HIGH (ref 70–99)
Glucose-Capillary: 229 mg/dL — ABNORMAL HIGH (ref 70–99)
Glucose-Capillary: 311 mg/dL — ABNORMAL HIGH (ref 70–99)

## 2011-10-23 LAB — MAGNESIUM: Magnesium: 2.4 mg/dL (ref 1.5–2.5)

## 2011-10-23 LAB — LIPASE, BLOOD: Lipase: 273 U/L — ABNORMAL HIGH (ref 11–59)

## 2011-10-23 LAB — RHEUMATOID FACTOR: Rhuematoid fact SerPl-aCnc: 16 IU/mL — ABNORMAL HIGH (ref <=14)

## 2011-10-24 ENCOUNTER — Inpatient Hospital Stay (HOSPITAL_COMMUNITY): Payer: Medicare Other

## 2011-10-24 LAB — DIFFERENTIAL
Basophils Absolute: 0 10*3/uL (ref 0.0–0.1)
Basophils Relative: 0 % (ref 0–1)
Eosinophils Absolute: 0.2 10*3/uL (ref 0.0–0.7)
Eosinophils Relative: 1 % (ref 0–5)
Lymphocytes Relative: 5 % — ABNORMAL LOW (ref 12–46)
Lymphs Abs: 0.9 10*3/uL (ref 0.7–4.0)
Monocytes Absolute: 1.2 10*3/uL — ABNORMAL HIGH (ref 0.1–1.0)
Monocytes Relative: 7 % (ref 3–12)
Neutro Abs: 15.1 10*3/uL — ABNORMAL HIGH (ref 1.7–7.7)
Neutrophils Relative %: 87 % — ABNORMAL HIGH (ref 43–77)

## 2011-10-24 LAB — GLUCOSE, CAPILLARY
Glucose-Capillary: 202 mg/dL — ABNORMAL HIGH (ref 70–99)
Glucose-Capillary: 211 mg/dL — ABNORMAL HIGH (ref 70–99)
Glucose-Capillary: 211 mg/dL — ABNORMAL HIGH (ref 70–99)
Glucose-Capillary: 214 mg/dL — ABNORMAL HIGH (ref 70–99)
Glucose-Capillary: 217 mg/dL — ABNORMAL HIGH (ref 70–99)
Glucose-Capillary: 224 mg/dL — ABNORMAL HIGH (ref 70–99)
Glucose-Capillary: 234 mg/dL — ABNORMAL HIGH (ref 70–99)

## 2011-10-24 LAB — BASIC METABOLIC PANEL
BUN: 38 mg/dL — ABNORMAL HIGH (ref 6–23)
CO2: 25 mEq/L (ref 19–32)
Calcium: 8.7 mg/dL (ref 8.4–10.5)
Chloride: 96 mEq/L (ref 96–112)
Creatinine, Ser: 1.38 mg/dL — ABNORMAL HIGH (ref 0.50–1.35)
GFR calc Af Amer: 64 mL/min — ABNORMAL LOW (ref >90)
GFR calc non Af Amer: 55 mL/min — ABNORMAL LOW (ref >90)
Glucose, Bld: 211 mg/dL — ABNORMAL HIGH (ref 70–99)
Potassium: 4.3 mEq/L (ref 3.5–5.1)
Sodium: 129 mEq/L — ABNORMAL LOW (ref 135–145)

## 2011-10-24 LAB — CBC
HCT: 29.4 % — ABNORMAL LOW (ref 39.0–52.0)
Hemoglobin: 10 g/dL — ABNORMAL LOW (ref 13.0–17.0)
MCH: 28.7 pg (ref 26.0–34.0)
MCHC: 34 g/dL (ref 30.0–36.0)
MCV: 84.5 fL (ref 78.0–100.0)
Platelets: 375 10*3/uL (ref 150–400)
RBC: 3.48 MIL/uL — ABNORMAL LOW (ref 4.22–5.81)
RDW: 14.4 % (ref 11.5–15.5)
WBC: 17.4 10*3/uL — ABNORMAL HIGH (ref 4.0–10.5)

## 2011-10-24 LAB — LIPASE, BLOOD: Lipase: 282 U/L — ABNORMAL HIGH (ref 11–59)

## 2011-10-24 MED ORDER — TECHNETIUM TC 99M MEBROFENIN IV KIT
5.3000 | PACK | Freq: Once | INTRAVENOUS | Status: AC | PRN
  Administered 2011-10-24: 5 via INTRAVENOUS

## 2011-10-25 LAB — GLUCOSE, CAPILLARY
Glucose-Capillary: 181 mg/dL — ABNORMAL HIGH (ref 70–99)
Glucose-Capillary: 168 mg/dL — ABNORMAL HIGH (ref 70–99)
Glucose-Capillary: 138 mg/dL — ABNORMAL HIGH (ref 70–99)
Glucose-Capillary: 198 mg/dL — ABNORMAL HIGH (ref 70–99)

## 2011-10-25 LAB — CBC
HCT: 31.1 % — ABNORMAL LOW (ref 39.0–52.0)
Hemoglobin: 10.2 g/dL — ABNORMAL LOW (ref 13.0–17.0)
MCH: 28.3 pg (ref 26.0–34.0)
MCHC: 32.8 g/dL (ref 30.0–36.0)
MCV: 86.4 fL (ref 78.0–100.0)
Platelets: 416 10*3/uL — ABNORMAL HIGH (ref 150–400)
RBC: 3.6 MIL/uL — ABNORMAL LOW (ref 4.22–5.81)
RDW: 14.5 % (ref 11.5–15.5)
WBC: 16.9 10*3/uL — ABNORMAL HIGH (ref 4.0–10.5)

## 2011-10-25 LAB — DIFFERENTIAL
Basophils Absolute: 0 10*3/uL (ref 0.0–0.1)
Basophils Relative: 0 % (ref 0–1)
Eosinophils Absolute: 0.1 10*3/uL (ref 0.0–0.7)
Eosinophils Relative: 0 % (ref 0–5)
Lymphocytes Relative: 6 % — ABNORMAL LOW (ref 12–46)
Lymphs Abs: 1 10*3/uL (ref 0.7–4.0)
Monocytes Absolute: 1.2 10*3/uL — ABNORMAL HIGH (ref 0.1–1.0)
Monocytes Relative: 7 % (ref 3–12)
Neutro Abs: 14.5 10*3/uL — ABNORMAL HIGH (ref 1.7–7.7)
Neutrophils Relative %: 86 % — ABNORMAL HIGH (ref 43–77)

## 2011-10-25 LAB — BASIC METABOLIC PANEL
BUN: 33 mg/dL — ABNORMAL HIGH (ref 6–23)
CO2: 25 mEq/L (ref 19–32)
Calcium: 8.7 mg/dL (ref 8.4–10.5)
Chloride: 98 mEq/L (ref 96–112)
Creatinine, Ser: 1.21 mg/dL (ref 0.50–1.35)
GFR calc Af Amer: 75 mL/min — ABNORMAL LOW (ref >90)
GFR calc non Af Amer: 64 mL/min — ABNORMAL LOW (ref >90)
Glucose, Bld: 211 mg/dL — ABNORMAL HIGH (ref 70–99)
Potassium: 4.6 mEq/L (ref 3.5–5.1)
Sodium: 131 mEq/L — ABNORMAL LOW (ref 135–145)

## 2011-10-26 LAB — BASIC METABOLIC PANEL
BUN: 28 mg/dL — ABNORMAL HIGH (ref 6–23)
CO2: 23 mEq/L (ref 19–32)
Calcium: 8.7 mg/dL (ref 8.4–10.5)
Chloride: 103 mEq/L (ref 96–112)
Creatinine, Ser: 1.06 mg/dL (ref 0.50–1.35)
GFR calc Af Amer: 88 mL/min — ABNORMAL LOW (ref >90)
GFR calc non Af Amer: 76 mL/min — ABNORMAL LOW (ref >90)
Glucose, Bld: 162 mg/dL — ABNORMAL HIGH (ref 70–99)
Potassium: 4.6 mEq/L (ref 3.5–5.1)
Sodium: 134 mEq/L — ABNORMAL LOW (ref 135–145)

## 2011-10-26 LAB — DIFFERENTIAL
Basophils Absolute: 0 10*3/uL (ref 0.0–0.1)
Basophils Relative: 0 % (ref 0–1)
Eosinophils Absolute: 0.1 10*3/uL (ref 0.0–0.7)
Eosinophils Relative: 0 % (ref 0–5)
Lymphocytes Relative: 5 % — ABNORMAL LOW (ref 12–46)
Lymphs Abs: 0.8 10*3/uL (ref 0.7–4.0)
Monocytes Absolute: 1.3 10*3/uL — ABNORMAL HIGH (ref 0.1–1.0)
Monocytes Relative: 8 % (ref 3–12)
Neutro Abs: 13.3 10*3/uL — ABNORMAL HIGH (ref 1.7–7.7)
Neutrophils Relative %: 86 % — ABNORMAL HIGH (ref 43–77)

## 2011-10-26 LAB — CBC
HCT: 30.7 % — ABNORMAL LOW (ref 39.0–52.0)
Hemoglobin: 9.9 g/dL — ABNORMAL LOW (ref 13.0–17.0)
MCH: 28.3 pg (ref 26.0–34.0)
MCHC: 32.2 g/dL (ref 30.0–36.0)
MCV: 87.7 fL (ref 78.0–100.0)
Platelets: 410 10*3/uL — ABNORMAL HIGH (ref 150–400)
RBC: 3.5 MIL/uL — ABNORMAL LOW (ref 4.22–5.81)
RDW: 14.8 % (ref 11.5–15.5)
WBC: 15.5 10*3/uL — ABNORMAL HIGH (ref 4.0–10.5)

## 2011-10-27 LAB — CULTURE, BLOOD (ROUTINE X 2)
Culture  Setup Time: 201210262324
Culture: NO GROWTH

## 2011-10-27 LAB — CBC
HCT: 29.7 % — ABNORMAL LOW (ref 39.0–52.0)
Hemoglobin: 9.5 g/dL — ABNORMAL LOW (ref 13.0–17.0)
MCH: 28.2 pg (ref 26.0–34.0)
MCHC: 32 g/dL (ref 30.0–36.0)
MCV: 88.1 fL (ref 78.0–100.0)
Platelets: 458 10*3/uL — ABNORMAL HIGH (ref 150–400)
RBC: 3.37 MIL/uL — ABNORMAL LOW (ref 4.22–5.81)
RDW: 14.8 % (ref 11.5–15.5)
WBC: 16.6 10*3/uL — ABNORMAL HIGH (ref 4.0–10.5)

## 2011-10-27 LAB — HEPATIC FUNCTION PANEL
ALT: 19 U/L (ref 0–53)
AST: 32 U/L (ref 0–37)
Albumin: 1.9 g/dL — ABNORMAL LOW (ref 3.5–5.2)
Alkaline Phosphatase: 102 U/L (ref 39–117)
Bilirubin, Direct: <0.1 mg/dL (ref 0.0–0.3)
Total Bilirubin: 0.2 mg/dL — ABNORMAL LOW (ref 0.3–1.2)
Total Protein: 6 g/dL (ref 6.0–8.3)

## 2011-10-27 LAB — BASIC METABOLIC PANEL
BUN: 25 mg/dL — ABNORMAL HIGH (ref 6–23)
CO2: 23 mEq/L (ref 19–32)
Calcium: 8.3 mg/dL — ABNORMAL LOW (ref 8.4–10.5)
Chloride: 101 mEq/L (ref 96–112)
Creatinine, Ser: 1.03 mg/dL (ref 0.50–1.35)
GFR calc Af Amer: >90 mL/min (ref >90)
GFR calc non Af Amer: 78 mL/min — ABNORMAL LOW (ref >90)
Glucose, Bld: 164 mg/dL — ABNORMAL HIGH (ref 70–99)
Potassium: 4.6 mEq/L (ref 3.5–5.1)
Sodium: 131 mEq/L — ABNORMAL LOW (ref 135–145)

## 2011-10-27 LAB — LIPASE, BLOOD: Lipase: 170 U/L — ABNORMAL HIGH (ref 11–59)

## 2011-10-28 LAB — CULTURE, BLOOD (ROUTINE X 2)
Culture  Setup Time: 201210270352
Culture: NO GROWTH

## 2011-10-28 LAB — CBC
HCT: 30 % — ABNORMAL LOW (ref 39.0–52.0)
Hemoglobin: 9.8 g/dL — ABNORMAL LOW (ref 13.0–17.0)
MCH: 28.7 pg (ref 26.0–34.0)
MCHC: 32.7 g/dL (ref 30.0–36.0)
MCV: 87.7 fL (ref 78.0–100.0)
Platelets: 422 10*3/uL — ABNORMAL HIGH (ref 150–400)
RBC: 3.42 MIL/uL — ABNORMAL LOW (ref 4.22–5.81)
RDW: 14.5 % (ref 11.5–15.5)
WBC: 17 10*3/uL — ABNORMAL HIGH (ref 4.0–10.5)

## 2011-10-28 LAB — BASIC METABOLIC PANEL
BUN: 26 mg/dL — ABNORMAL HIGH (ref 6–23)
CO2: 23 mEq/L (ref 19–32)
Calcium: 8.4 mg/dL (ref 8.4–10.5)
Chloride: 102 mEq/L (ref 96–112)
Creatinine, Ser: 0.93 mg/dL (ref 0.50–1.35)
GFR calc Af Amer: >90 mL/min (ref >90)
GFR calc non Af Amer: >90 mL/min (ref >90)
Glucose, Bld: 190 mg/dL — ABNORMAL HIGH (ref 70–99)
Potassium: 5.1 mEq/L (ref 3.5–5.1)
Sodium: 131 mEq/L — ABNORMAL LOW (ref 135–145)

## 2011-10-28 MED ORDER — SODIUM CHLORIDE 0.9 % IV SOLN: INTRAVENOUS | Status: DC

## 2011-10-28 MED ORDER — CARVEDILOL 25 MG PO TABS
25.0000 mg | ORAL_TABLET | Freq: Two times a day (BID) | ORAL | Status: DC
  Filled 2011-10-28 (×5): qty 1

## 2011-10-28 MED ORDER — INSULIN ASPART 100 UNIT/ML ~~LOC~~ SOLN
0.0000 [IU] | Freq: Every day | SUBCUTANEOUS | Status: DC
  Filled 2011-10-28: qty 3

## 2011-10-28 MED ORDER — ACETAMINOPHEN 325 MG PO TABS
650.0000 mg | ORAL_TABLET | ORAL | Status: DC | PRN
Start: 2011-10-28 — End: 2011-10-28

## 2011-10-28 MED ORDER — LISINOPRIL 40 MG PO TABS
40.0000 mg | ORAL_TABLET | Freq: Every day | ORAL | Status: DC
  Filled 2011-10-28 (×3): qty 1

## 2011-10-28 MED ORDER — ENOXAPARIN SODIUM 80 MG/0.8ML ~~LOC~~ SOLN
80.0000 mg | SUBCUTANEOUS | Status: DC
  Filled 2011-10-28 (×2): qty 0.8

## 2011-10-28 MED ORDER — FERROUS SULFATE 325 (65 FE) MG PO TABS
325.0000 mg | ORAL_TABLET | Freq: Two times a day (BID) | ORAL | Status: DC
  Filled 2011-10-28 (×6): qty 1

## 2011-10-28 MED ORDER — HYDROCODONE-ACETAMINOPHEN 5-325 MG PO TABS: 1.0000 | ORAL_TABLET | ORAL | Status: DC | PRN

## 2011-10-28 MED ORDER — COLCHICINE 0.6 MG PO TABS
0.6000 mg | ORAL_TABLET | Freq: Every day | ORAL | Status: DC
  Filled 2011-10-28 (×3): qty 1

## 2011-10-28 MED ORDER — GABAPENTIN 300 MG PO CAPS
300.0000 mg | ORAL_CAPSULE | Freq: Two times a day (BID) | ORAL | Status: DC
  Filled 2011-10-28 (×6): qty 1

## 2011-10-28 MED ORDER — GI COCKTAIL ~~LOC~~
30.0000 mL | Freq: Three times a day (TID) | ORAL | Status: DC | PRN
  Filled 2011-10-28: qty 30

## 2011-10-28 MED ORDER — INSULIN NPH (HUMAN) (ISOPHANE) 100 UNIT/ML ~~LOC~~ SUSP: 60.0000 [IU] | Freq: Every day | SUBCUTANEOUS | Status: DC

## 2011-10-28 MED ORDER — INSULIN ASPART 100 UNIT/ML ~~LOC~~ SOLN
0.0000 [IU] | Freq: Three times a day (TID) | SUBCUTANEOUS | Status: DC
  Filled 2011-10-28: qty 3

## 2011-10-28 MED ORDER — ONDANSETRON HCL 4 MG/2ML IJ SOLN: 4.0000 mg | Freq: Four times a day (QID) | INTRAMUSCULAR | Status: DC | PRN

## 2011-10-28 MED ORDER — BIOTENE DRY MOUTH MT LIQD
15.0000 mL | Freq: Two times a day (BID) | OROMUCOSAL | Status: DC
  Filled 2011-10-28 (×5): qty 15

## 2011-10-28 MED ORDER — PANTOPRAZOLE SODIUM 40 MG PO TBEC: 40.0000 mg | DELAYED_RELEASE_TABLET | Freq: Every day | ORAL | Status: DC

## 2011-10-28 MED ORDER — MORPHINE SULFATE 2 MG/ML IJ SOLN: 2.0000 mg | INTRAMUSCULAR | Status: DC | PRN

## 2011-10-28 MED ORDER — BISACODYL 5 MG PO TBEC: 10.0000 mg | DELAYED_RELEASE_TABLET | Freq: Every day | ORAL | Status: DC

## 2011-10-28 MED ORDER — PRAVASTATIN SODIUM 40 MG PO TABS
80.0000 mg | ORAL_TABLET | Freq: Every day | ORAL | Status: DC
  Filled 2011-10-28 (×3): qty 2

## 2011-10-28 MED ORDER — AMLODIPINE BESYLATE 10 MG PO TABS
10.0000 mg | ORAL_TABLET | Freq: Every day | ORAL | Status: DC
  Filled 2011-10-28 (×3): qty 1

## 2011-10-28 MED ORDER — INSULIN NPH (HUMAN) (ISOPHANE) 100 UNIT/ML ~~LOC~~ SUSP
60.0000 [IU] | Freq: Every day | SUBCUTANEOUS | Status: DC
Start: 2011-10-28 — End: 2011-10-28

## 2011-10-28 MED ORDER — PREDNISONE 50 MG PO TABS
60.0000 mg | ORAL_TABLET | Freq: Every day | ORAL | Status: DC
  Filled 2011-10-28 (×2): qty 1

## 2011-10-28 MED ORDER — ALBUTEROL SULFATE (5 MG/ML) 0.5% IN NEBU
2.5000 mg | INHALATION_SOLUTION | Freq: Four times a day (QID) | RESPIRATORY_TRACT | Status: DC | PRN
Start: 2011-10-28 — End: 2011-10-28
  Filled 2011-10-28: qty 20

## 2011-10-28 MED ORDER — IPRATROPIUM BROMIDE 0.02 % IN SOLN: 0.5000 mg | Freq: Four times a day (QID) | RESPIRATORY_TRACT | Status: DC | PRN

## 2011-10-28 MED ORDER — ONDANSETRON HCL 4 MG PO TABS: 4.0000 mg | ORAL_TABLET | Freq: Four times a day (QID) | ORAL | Status: DC | PRN

## 2011-10-29 NOTE — Discharge Summary (Signed)
NAMEDEVARIOUS, PAVEK NO.:  [id number]  MEDICAL RECORD NO.:  [id number]  LOCATION:  1340                         FACILITY:  [facility]  PHYSICIAN:  Debbora Presto, MD DATE OF BIRTH:  October 20, 1951  DATE OF ADMISSION:  10/21/2011 DATE OF DISCHARGE:  10/28/2011                              DISCHARGE SUMMARY   PRIMARY CARE PHYSICIAN:  Dr. Loyal Buba.  DISCHARGE MEDICATIONS: 1. Amlodipine 10 mg tablet daily. 2. Bisacodyl 5 mg tablet EC, take 10 mg tablet by mouth daily. 3. Colchicine 0.6 mg tablet daily. 4. Iron sulfate 325 mg tablet twice daily. 5. GI cocktail 30 mL every 8 hours as needed for GI discomfort. 6. Insulin sliding scale 2-5 units subcutaneously daily at bedtime per     sliding scale protocol. 7. Zofran 4 mg tablet every 6 hours as needed for nausea. 8. Prednisone take 40 mg tablet for 5 days until November 01, 2011,     continue to taper down to 20 mg tablet daily from November 7 to     November 11, continue to taper down to 10 mg tablet from November     12 to November 16 and then stop. 9. Coreg 25 mg tablet by mouth twice daily. 10.Diphenoxylate and atropine 1 tablets 4 times daily. 11.Gabapentin 300 mg capsule twice daily. 12.Hydrocodone APAP 7.5/325 twice daily by mouth. 13.Lisinopril 10 mg tablet twice daily. 14.Novolin 90 units subcutaneously twice daily. 15.Pravachol 80 mg tablet daily at bedtime. 16.Prilosec 20 mg tablet every morning. 17.Tramadol 50 mg tablet by mouth twice daily. Please stop taking furosemide 80 mg tablet until seen by primary care physician.  DISCHARGE DIAGNOSES: 1. Severe polyarthritis questionably secondary to gouty arthritis     versus rheumatoid arthritis. 2. Rheumatoid arthritis. 3. Diabetes. 4. Hypertension. 5. Anemia. 6. Hyperlipidemia. 7. Cardiomyopathy. 8. Morbid obesity. 9. Chronic obstructive pulmonary disease. 10.Obstructive sleep apnea. 11.Chronic kidney disease stage 4.  DISPOSITION AND FOLLOWUP:   The patient was discharged from the hospital in stable condition.  Please note that Lasix was held during the hospitalization due to initially low blood pressure and acute on chronic kidney injury.  The patient was advised to see primary care physician prior to initiating Lasix.  The patient will need follow up on electrolyte panel as well as CBC to ensure that hemoglobin remains at his baseline and that electrolytes are also within normal limits. Please note that colchicine was added during the hospitalization since his polyarthropathy was also contributed to gouty arthritis.  In addition, please note the above taper dose of prednisone, which the patient is to complete, but he also needs to schedule an appointment with the rheumatologist.  The phone numbers were provided for the patient.  Rheumatology office [phone].  Dr. Biagio Quint with General Surgery, phone [phone].  The patient was advised to call these numbers to schedule an appointment at his convenience.  CONSULTATION:  Dr. Ovidio Kin with Surgery.  DIAGNOSTIC TESTS:  During the hospitalization, CT of the abdomen and pelvis, cholelithiasis no specific infiltration in the right peritoneal fat.  No other acute process demonstrated.  Chest x-ray, October 26, cardiac enlargement, but no active cardiopulmonary disease.  Abdominal ultrasound, October 27, cholelithiasis  without secondary signs of cholecystitis.  Nuclear medicine hepatobiliary imaging, October 24, 2011, patent cystic duct without evidence for acute cholecystitis, delayed filling of the gallbladder following morphine sulfate consistent with chronic cholecystitis.  HISTORY OF PRESENT ILLNESS:  The patient is very pleasant 60 year old gentleman with history outlined below, who presented to [facility] with nausea, vomiting, and abdominal pain of 10 days duration.  Symptoms were slightly improved from when they first started, but the patient initially developed diarrhea,  but no melena.  He denies fevers or chills, or urinary symptoms.  He also denies chest pain, shortness of breath, or cough.  He also reported going to [facility] when he was admitted by his PCP, and was in the hospital for 7 days, and during the hospitalization, he has developed severe aches all over his body and joints.  He was not able to move around due to pain.  PHYSICAL EXAMINATION:  VITAL SIGNS:  On discharge; temperature 98.4, blood pressure 164/84, heart rate 64, oxygen 99% on 2 L. GENERAL:  Not in acute distress. CARDIOVASCULAR:  Regular rate and rhythm.  S1, S2 present.  LUNGS: Clear to auscultation bilaterally. ABDOMEN:  Soft, nontender, nondistended.  Bowel sounds present. EXTREMITIES:  No edema. NEUROLOGIC:  Nonfocal.  Sodium 131, potassium 5.1, chloride 102, bicarb 23, BUN 26, creatinine 0.93, glucose 190.  WBC 17, hemoglobin 9.8, hematocrit 30, and platelets 422.  Lipase 282 and on discharge 170.  HOSPITAL COURSE BY PROBLEM: 1. Severe polyarthritis-with elevated uric acid and rheumatoid factor.     The patient was started on prednisone and colchicine and has shown     significant improvement in pain as well as decrease in     inflammations in joints.  He does not have a primary rheumatology,     but was advised he needs to follow up for adjusting the medication     regimen.  The patient will be discharged from the hospital on taper     of prednisone.  As discussed with rheumatology on call while the     patient was in the hospital.  The patient was recommended to see     Rheumatology within 2-3 weeks of discharge. 2. Hypertension, well controlled during the hospitalization. 3. Diabetes, controlled during the hospitalization. 4. Anemia.  Hemoglobin and hematocrit remained stable during the     hospitalization.  Leukocytosis secondary to steroid and improving. 5. History of rheumatoid arthritis.  The patient will need to follow     up with rheumatologist to  initiate DMARD since he is not on any     active rheumatoid arthritis medication. 6. Acute on chronic renal failure-the patient responded well to     hydration and fluids and his creatinine during the last 4 days of     hospitalization has been normal less than 1. 7. Cholelithiasis.  This was confirmed on CT of the abdomen and pelvis     as well as HIDA scan.  Surgery evaluated the patient and     recommended outpatient followup for further care and management.     The patient was provided the number to call them at his     convenience. Over 30 minutes was spent on discharging the patient.     Debbora Presto, MD     IM/MEDQ  D:  10/28/2011  T:  10/28/2011  Job:  [id number]  cc:   Dr. Loyal Buba  Electronically Signed by Debbora Presto MD on 10/29/2011 10:43:21 PM

## 2011-10-29 NOTE — Consult Note (Signed)
NAMEDELMOS, VELAQUEZ NO.:  [id number]  MEDICAL RECORD NO.:  [id number]  LOCATION:  1341                         FACILITY:  [facility]  PHYSICIAN:  Sandria Bales. Ezzard Standing, M.D.  DATE OF BIRTH:  [birth year]  DATE OF CONSULTATION:  10/22/2011                                CONSULTATION   REASON FOR CONSULTATION:  Gallstones.  CONSULTING PHYSICIAN:  Ramiro Harvest, MD  HISTORY OF ILLNESS:  Mr. Tonne is a 60 year old white male, who is a patient of Dr. Konrad Dolores in [place], presents with an obscure story.  He says he got sick in December 2011, was hospitalized in the [facility] [place], had an abnormal gallbladder with gallstones, was treated medically and got better.    He says he did well to about 10 days ago when he had a similar bout of abdominal pain, got sick with nausea and diarrhea and was readmitted to the [facility] from October 17, 2011, to October 20, 2011 by Dr. Konrad Dolores.  During that hospitalization he  said a surgeon saw him, though he does not remember the name, and talked about doing the gallbladder surgery on him.  The patient however got better, was discharged home on October 25, however, on Friday 26th, one day later he was feeling bad enough to  be brought by ambulance to [facility] and was admitted by the hospitalist and Dr. Gery Pray.  Her impression include persisting gastroenteritis, fever, cholelithiasis, hyponatremia.  I was called because of the gallbladder disease.  PATIENT'S HISTORY:  He has no prior history of stomach ulcers, liver disease, pancreas disease, or colon disease.  He said he had a colonoscopy in May 2011 in [place], [place], 1 polyp was found.  Again, he dates his gallbladder troubled back in December 1 attack, and he thinks he has something kind of going on similar for these last 10 days.  He appeared unusual when I examined, he is having polyarthritis but he says he cannot move a single joint, he cannot bend  his arms, he cannot sit up, he cannot bend his knees.  He cannot even remove the sheets over his body.  I am not sure how this is playing a role in his whole symptomatology.  PAST MEDICAL HISTORY:  His past medical includes his admission medication, this is included: 1. Coreg 25 mg daily b.i.d. 2. Lasix 80 mg b.i.d. 3. Lisinopril 10 mg b.i.d. 4. Spironolactone 25 mg b.i.d. 5. Pravastatin 80 mg daily. 6. Prilosec daily. 7. Gabapentin 300 mg b.i.d. 8. Novolin N 96 units in the morning and 99 units at night. 9. NovoLog 50 units with each meal. 10.Hydrocodone 7.5 mg p.r.n.  ALLERGIES:  He has allergy to [facility].  REVIEW OF SYSTEMS:   NEUROLOGIC:  No seizures or loss of consciousness. CARDIAC.  He sees Dr. Adella Hare, at [facility]. From a cardiac standpoint, he had 2 caths, his last one being in 2008. He states he never had a stent, never had bypass.  He has hypertension for at least about 7 years.   PULMONARY:  He does not smoke cigarettes. He has sleep apnea since 2003 and being on CPAP since  that time. ENDOCRINE:  He has no thyroid issues, except being diabetic since 1999. Insulin dependence since 2006.   GASTROINTESTINAL:  See history of present illness.   RENAL:  He has had kidney stones, most recently in May 2012.   MUSCULOSKELETAL:  He has a lot of issues particularly with lower back and lumbar.  He says he gets shots monthly.  He lives in [place].  His wife is not at the bedside.  He has 2 children, ages 21 and 31.  He actually does work part-time at [employer].  PHYSICAL EXAMINATION:  VITAL SIGNS:  His temperature is 98.2, pulse is 88, blood pressure 133/74. GENERAL:  He is morbidly obese white male, who is just laying supine in the bed, cannot move either his upper arms or lower legs without significant discomfort.  He has no fever.  No rash. LUNGS:  His lungs are clear to auscultation. HEART:  Regular rate and rhythm. ABDOMEN:  Actually soft.   He has no tenderness, no guarding.  He is very obese which really limits the physical exam. EXTREMITIES:  He came with extremities with a lot of pain.  LABORATORY DATA:  His labs that I have, he has a cholesterol of 117. His TSH is 1.59. His last lipase dated October 27 was 231.  His total bilirubin is 0.4.  His  alk phos is 84.  His SGOT is 39.  His albumin is 2.1.  His amylase on the 27th was 108.  His proB natriuretic peptide 1210.  The white blood count is 13,300 with hemoglobin of 10, hematocrit 29, and platelet count 271,000.  His creatinine of 1.71.  CAT scan on October 26.  This shows cholelithiasis, renal parenchymal atrophy, nonspecific infiltrate in his retroperitoneal fat, but no obvious acute process.  IMPRESSION: 1. Cholelithiasis with history of cholecystitis.  In the patient's     current condition, it is really hard to evaluate his gallbladder.     I do not think he has acute cholecystitis.  A HIDA scan may help clarify the extent of his gall bladder disese.  I think the ultrasound sounds very good, maybe even a hepatobiliary scan to     see if he has a patent cystic duct. 2. Morbid obesity.  Weight is 540 or more. 3. His polyarthritis with being bed ridden.  The cause of this is unknown.  The patient can hardly move on my physical exam because of severe joint pain. 4. Hypertension. 5. Chronic coronary artery disease, seen by Dr. Adella Hare, [facility]. 6. Obstructive sleep apnea on CPAP. 7. Diabetic, insulin dependent. 8. History of kidney stones, and renal atrophy on CT scan. 9. Lower lumbar problems for which he sees [place], gets injections     nightly. 10.Chronic renal insufficiency. 11.History of pancreatitis with mild elevated amylase.   Sandria Bales. Ezzard Standing, M.D., FACS   DHN/MEDQ  D:  10/22/2011  T:  10/23/2011  Job:  [id number]  cc:   Ramiro Harvest, MD  Dr. Wyline Mood  Electronically Signed by Ovidio Kin M.D. on 10/29/2011  08:00:43 AM

## 2011-10-30 LAB — GLUCOSE, CAPILLARY
Glucose-Capillary: 166 mg/dL — ABNORMAL HIGH (ref 70–99)
Glucose-Capillary: 181 mg/dL — ABNORMAL HIGH (ref 70–99)
Glucose-Capillary: 160 mg/dL — ABNORMAL HIGH (ref 70–99)
Glucose-Capillary: 151 mg/dL — ABNORMAL HIGH (ref 70–99)
Glucose-Capillary: 172 mg/dL — ABNORMAL HIGH (ref 70–99)
Glucose-Capillary: 203 mg/dL — ABNORMAL HIGH (ref 70–99)
Glucose-Capillary: 211 mg/dL — ABNORMAL HIGH (ref 70–99)
Glucose-Capillary: 184 mg/dL — ABNORMAL HIGH (ref 70–99)
Glucose-Capillary: 263 mg/dL — ABNORMAL HIGH (ref 70–99)

## 2012-01-04 ENCOUNTER — Encounter (INDEPENDENT_AMBULATORY_CARE_PROVIDER_SITE_OTHER): Payer: Self-pay | Admitting: Surgery

## 2012-01-04 ENCOUNTER — Ambulatory Visit (INDEPENDENT_AMBULATORY_CARE_PROVIDER_SITE_OTHER): Payer: Self-pay | Admitting: Surgery

## 2012-01-04 VITALS — BP 168/82 | HR 72 | Temp 97.0°F | Resp 18 | Ht 73.0 in | Wt 360.2 lb

## 2012-01-04 DIAGNOSIS — M109 Gout, unspecified: Secondary | ICD-10-CM | POA: Insufficient documentation

## 2012-01-04 DIAGNOSIS — K8010 Calculus of gallbladder with chronic cholecystitis without obstruction: Secondary | ICD-10-CM | POA: Insufficient documentation

## 2012-01-04 DIAGNOSIS — I10 Essential (primary) hypertension: Secondary | ICD-10-CM | POA: Insufficient documentation

## 2012-01-04 DIAGNOSIS — G5700 Lesion of sciatic nerve, unspecified lower limb: Secondary | ICD-10-CM | POA: Insufficient documentation

## 2012-01-04 HISTORY — DX: Calculus of gallbladder with chronic cholecystitis without obstruction: K80.10

## 2012-01-04 NOTE — Patient Instructions (Signed)
[facility] LAPAROSCOPIC SURGERY: POST OP INSTRUCTIONS  Always review your discharge instruction sheet given to you by the facility where your surgery was performed.  1. A prescription for pain medication may be given to you upon discharge.  Take your pain medication as prescribed, if needed.  If narcotic pain medicine is not needed, then you may take acetaminophen (Tylenol) or ibuprofen (Advil) as needed. 2. Take your usually prescribed medications unless otherwise directed. 3. If you need a refill on your pain medication, please contact your pharmacy.  They will contact our office to request authorization. Prescriptions will not be filled after 5pm or on week-ends. 4. You should follow a light diet the first few days after arrival home, such as soup and crackers, etc.  Be sure to include lots of fluids daily. 5. Most patients will experience some swelling and bruising in the area of the incisions.  Ice packs will help.  Swelling and bruising can take several days to resolve.  6. It is common to experience some constipation if taking pain medication after surgery.  Increasing fluid intake and taking a stool softener (such as Colace) will usually help or prevent this problem from occurring.  A mild laxative (Milk of Magnesia or Miralax) should be taken according to package instructions if there are no bowel movements after 48 hours. 7. Unless discharge instructions indicate otherwise, you may remove your bandages 24-48 hours after surgery, and you may shower at that time.  You may have steri-strips (small skin tapes) in place directly over the incision.  These strips should be left on the skin for 7-10 days.  If your surgeon used skin glue on the incision, you may shower in 24 hours.  The glue will flake off over the next 2-3 weeks.  Any sutures or staples will be removed at the office during your follow-up visit. 8. ACTIVITIES:  You may resume regular (light) daily activities  beginning the next day-such as daily self-care, walking, climbing stairs-gradually increasing activities as tolerated.  You may have sexual intercourse when it is comfortable.  Refrain from any heavy lifting or straining until approved by your doctor. 9. You may drive when you are no longer taking prescription pain medication, you can comfortably wear a seatbelt, and you can safely maneuver your car and apply brakes. 10. You should see your doctor in the office for a follow-up appointment approximately 2-3 weeks after your surgery.  Make sure that you call for this appointment within a day or two after you arrive home to insure a convenient appointment time.  WHEN TO CALL YOUR DOCTOR: 1. Fever over 101.0 2. Inability to urinate 3. Continued bleeding from incision. 4. Increased pain, redness, or drainage from the incision. 5. Increasing abdominal pain  The clinic staff is available to answer your questions during regular business hours.  Please don't hesitate to call and ask to speak to one of the nurses for clinical concerns.  If you have a medical emergency, go to the nearest emergency room or call 911.  A surgeon from [facility] is always on call at the hospital. [phone] ? [phone] ? FAX [phone] Web site: [email or web address]  

## 2012-01-04 NOTE — Progress Notes (Signed)
Chief Complaint  Patient presents with  . Abdominal Pain    new pt - gallstones - referral by Dr. Emeline General   HISTORY: Patient is a 61 year old white male with a 3 month history of intermittent right upper cord and an epigastric abdominal pain. Patient was initially seen at an outside hospital in mid October. He underwent abdominal ultrasound showing gallstones. Patient deferred surgery at that time. The patient developed a significant recurrence of abdominal pain and presented to the [facility] long emergency department in late October 2012. Evaluation included laboratory studies showing a slightly elevated white blood cell count and an elevated lipase level. Patient underwent CT scan abdomen and pelvis showing gallstones. No evidence of infection. No evidence of pancreatitis. Patient now presents for consideration for cholecystectomy.  Patient denies any history of jaundice or acholic stools. He has had nausea and vomiting associated with abdominal pain. Patient denies hepatobiliary or pancreatic disease. There is a family history of gallbladder disease in the patient's mother.  Past Medical History  Diagnosis Date  . CHF (congestive heart failure)   . Diabetes mellitus   . Neuropathy   . Hypertension   . COPD (chronic obstructive pulmonary disease)   . Gallstones   . RA (rheumatoid arthritis)   . Obese   . Kidney stones   . Piriformis syndrome     left hip  . Lumbar disc disease     compression   . Sleep apnea   . Gout      Current Outpatient Prescriptions  Medication Sig Dispense Refill  . amLODipine (NORVASC) 2.5 MG tablet Take 2.5 mg by mouth daily.      [name] aspirin 81 MG tablet Take 160 mg by mouth daily.      . calcitRIOL (ROCALTROL) 0.25 MCG capsule Take 0.25 mcg by mouth daily.      . carvedilol (COREG) 25 MG tablet Take 25 mg by mouth 2 (two) times daily with a meal.        . furosemide (LASIX) 80 MG tablet Take 80 mg by mouth 2 (two) times daily.        [name] gabapentin  (NEURONTIN) 300 MG capsule Take 300 mg by mouth 2 (two) times daily.        [name] HYDROcodone-acetaminophen (NORCO) 7.5-325 MG per tablet Take 1 tablet by mouth 2 (two) times daily.        [name] HYDROXYCHLOROQUINE SULFATE PO Take 300 mg by mouth 2 (two) times daily.      . insulin aspart (NOVOLOG) 100 UNIT/ML injection Inject 50 Units into the skin 3 (three) times daily before meals.      . insulin NPH (HUMULIN N,NOVOLIN N) 100 UNIT/ML injection Inject 90 Units into the skin 2 (two) times daily.        [name] lisinopril (PRINIVIL,ZESTRIL) 10 MG tablet Take 10 mg by mouth 2 (two) times daily.        [name] omeprazole (PRILOSEC) 20 MG capsule Take 20 mg by mouth every morning.        . pravastatin (PRAVACHOL) 80 MG tablet Take 80 mg by mouth at bedtime.        . traMADol (ULTRAM) 50 MG tablet Take 50 mg by mouth 2 (two) times daily. Maximum dose= 8 tablets per day          Allergies  Allergen Reactions  . Ciprofloxacin Hives  . Levofloxacin (Levaquin) Hives     Family History  Problem Relation Age of Onset  . Hypertension Mother   .  Diabetes Father   . Diabetes Brother      History   Social History  . Marital Status: Married    Spouse Name: N/A    Number of Children: N/A  . Years of Education: N/A   Social History Main Topics  . Smoking status: Former Smoker    Quit date: 12/26/1994  . Smokeless tobacco: None  . Alcohol Use: No  . Drug Use: No  . Sexually Active:    Other Topics Concern  . None   Social History Narrative  . None     REVIEW OF SYSTEMS - PERTINENT POSITIVES ONLY: Denies jaundice, denies acholic stools. Positive for fever and chills with attacks.   EXAM: Filed Vitals:   01/04/12 1001  BP: 168/82  Pulse: 72  Temp: 97 F (36.1 C)  Resp: 18    HEENT: normocephalic; pupils equal and reactive; sclerae clear; dentition good; mucous membranes moist NECK:  No nodules; symmetric on extension; no palpable anterior or posterior cervical lymphadenopathy; no  supraclavicular masses; no tenderness CHEST: clear to auscultation bilaterally without rales, rhonchi, or wheezes CARDIAC: regular rate and rhythm without significant murmur; peripheral pulses are full ABDOMEN: Morbidly obese; well healed wound at umbilicus; no recurrence of hernia; no tenderness; no hepatosplenomegaly EXT:  non-tender with mild edema; no deformity NEURO: no gross focal deficits; no sign of tremor   LABORATORY RESULTS: See E-Chart for most recent results   RADIOLOGY RESULTS: See E-Chart or I-Site for most recent results   IMPRESSION: #1 symptomatic cholelithiasis, chronic cholecystitis #2 rule out choledocholithiasis #3 hypertension #4 gout #5 history of congestive heart failure #6 morbid obesity  PLAN: I discussed with the patient and his wife the indications for cholecystectomy. We discussed laparoscopic cholecystectomy. We discussed intraoperative cholangiography to rule out common bile duct stones. We discussed the possible need for ERCP. We discussed the postoperative course to be anticipated. Patient understands and wishes to proceed. Written literature is provided to the patient to review.  The risks and benefits of the procedure have been discussed at length with the patient.  The patient understands the proposed procedure, potential alternative treatments, and the course of recovery to be expected.  All of the patient's questions have been answered at this time.  The patient wishes to proceed with surgery and will schedule a date for their procedure through our office staff.  Velora Heckler, MD, FACS General & Endocrine Surgery [facility]   Visit Diagnoses: 1. Cholelithiasis with cholecystitis     Primary Care Physician: Provider Not In System

## 2012-01-11 ENCOUNTER — Encounter (HOSPITAL_COMMUNITY): Payer: Self-pay | Admitting: Pharmacy Technician

## 2012-01-16 ENCOUNTER — Emergency Department (HOSPITAL_COMMUNITY): Payer: Medicare Other

## 2012-01-16 ENCOUNTER — Encounter (HOSPITAL_COMMUNITY): Payer: Self-pay | Admitting: Family Medicine

## 2012-01-16 ENCOUNTER — Other Ambulatory Visit: Payer: Self-pay

## 2012-01-16 ENCOUNTER — Inpatient Hospital Stay (HOSPITAL_COMMUNITY)
Admission: EM | Admit: 2012-01-16 | Discharge: 2012-01-21 | DRG: 418 | Disposition: A | Discharge status: Home / Self Care | Payer: Medicare Other | Attending: Internal Medicine | Admitting: Internal Medicine

## 2012-01-16 DIAGNOSIS — J449 Chronic obstructive pulmonary disease, unspecified: Secondary | ICD-10-CM | POA: Diagnosis present

## 2012-01-16 DIAGNOSIS — K8042 Calculus of bile duct with acute cholecystitis without obstruction: Principal | ICD-10-CM | POA: Diagnosis present

## 2012-01-16 DIAGNOSIS — E1149 Type 2 diabetes mellitus with other diabetic neurological complication: Secondary | ICD-10-CM | POA: Diagnosis present

## 2012-01-16 DIAGNOSIS — K8010 Calculus of gallbladder with chronic cholecystitis without obstruction: Secondary | ICD-10-CM

## 2012-01-16 DIAGNOSIS — E119 Type 2 diabetes mellitus without complications: Secondary | ICD-10-CM | POA: Diagnosis present

## 2012-01-16 DIAGNOSIS — M109 Gout, unspecified: Secondary | ICD-10-CM | POA: Diagnosis present

## 2012-01-16 DIAGNOSIS — Z87442 Personal history of urinary calculi: Secondary | ICD-10-CM

## 2012-01-16 DIAGNOSIS — N184 Chronic kidney disease, stage 4 (severe): Secondary | ICD-10-CM | POA: Diagnosis present

## 2012-01-16 DIAGNOSIS — Z794 Long term (current) use of insulin: Secondary | ICD-10-CM

## 2012-01-16 DIAGNOSIS — I509 Heart failure, unspecified: Secondary | ICD-10-CM | POA: Diagnosis present

## 2012-01-16 DIAGNOSIS — J4489 Other specified chronic obstructive pulmonary disease: Secondary | ICD-10-CM | POA: Diagnosis present

## 2012-01-16 DIAGNOSIS — M069 Rheumatoid arthritis, unspecified: Secondary | ICD-10-CM | POA: Diagnosis present

## 2012-01-16 DIAGNOSIS — E1142 Type 2 diabetes mellitus with diabetic polyneuropathy: Secondary | ICD-10-CM | POA: Diagnosis present

## 2012-01-16 DIAGNOSIS — Z9989 Dependence on other enabling machines and devices: Secondary | ICD-10-CM | POA: Diagnosis present

## 2012-01-16 DIAGNOSIS — I10 Essential (primary) hypertension: Secondary | ICD-10-CM | POA: Diagnosis present

## 2012-01-16 DIAGNOSIS — I428 Other cardiomyopathies: Secondary | ICD-10-CM | POA: Diagnosis present

## 2012-01-16 DIAGNOSIS — R112 Nausea with vomiting, unspecified: Secondary | ICD-10-CM

## 2012-01-16 DIAGNOSIS — N179 Acute kidney failure, unspecified: Secondary | ICD-10-CM | POA: Diagnosis not present

## 2012-01-16 DIAGNOSIS — G5700 Lesion of sciatic nerve, unspecified lower limb: Secondary | ICD-10-CM

## 2012-01-16 DIAGNOSIS — N1832 Chronic kidney disease, stage 3b: Secondary | ICD-10-CM | POA: Diagnosis present

## 2012-01-16 DIAGNOSIS — R1011 Right upper quadrant pain: Secondary | ICD-10-CM

## 2012-01-16 DIAGNOSIS — G4733 Obstructive sleep apnea (adult) (pediatric): Secondary | ICD-10-CM | POA: Diagnosis present

## 2012-01-16 DIAGNOSIS — K8020 Calculus of gallbladder without cholecystitis without obstruction: Secondary | ICD-10-CM

## 2012-01-16 LAB — COMPREHENSIVE METABOLIC PANEL
ALT: 25 U/L (ref 0–53)
AST: 20 U/L (ref 0–37)
Albumin: 3.8 g/dL (ref 3.5–5.2)
Alkaline Phosphatase: 61 U/L (ref 39–117)
BUN: 21 mg/dL (ref 6–23)
CO2: 28 mEq/L (ref 19–32)
Calcium: 9.6 mg/dL (ref 8.4–10.5)
Chloride: 96 mEq/L (ref 96–112)
Creatinine, Ser: 1.33 mg/dL (ref 0.50–1.35)
GFR calc Af Amer: 67 mL/min — ABNORMAL LOW (ref >90)
GFR calc non Af Amer: 57 mL/min — ABNORMAL LOW (ref >90)
Glucose, Bld: 230 mg/dL — ABNORMAL HIGH (ref 70–99)
Potassium: 3.7 mEq/L (ref 3.5–5.1)
Sodium: 137 mEq/L (ref 135–145)
Total Bilirubin: 0.6 mg/dL (ref 0.3–1.2)
Total Protein: 7.9 g/dL (ref 6.0–8.3)

## 2012-01-16 LAB — CARDIAC PANEL(CRET KIN+CKTOT+MB+TROPI)
CK, MB: 1.3 ng/mL (ref 0.3–4.0)
Relative Index: INVALID (ref 0.0–2.5)
Total CK: 31 U/L (ref 7–232)
Troponin I: <0.3 ng/mL (ref <0.30)

## 2012-01-16 LAB — GLUCOSE, CAPILLARY: Glucose-Capillary: 200 mg/dL — ABNORMAL HIGH (ref 70–99)

## 2012-01-16 LAB — CBC
HCT: 41.5 % (ref 39.0–52.0)
Hemoglobin: 14 g/dL (ref 13.0–17.0)
MCH: 29.2 pg (ref 26.0–34.0)
MCHC: 33.7 g/dL (ref 30.0–36.0)
MCV: 86.5 fL (ref 78.0–100.0)
Platelets: 177 10*3/uL (ref 150–400)
RBC: 4.8 MIL/uL (ref 4.22–5.81)
RDW: 15 % (ref 11.5–15.5)
WBC: 11 10*3/uL — ABNORMAL HIGH (ref 4.0–10.5)

## 2012-01-16 LAB — APTT: aPTT: 26 seconds (ref 24–37)

## 2012-01-16 LAB — PROTIME-INR
INR: 0.99 (ref 0.00–1.49)
Prothrombin Time: 13.3 seconds (ref 11.6–15.2)

## 2012-01-16 LAB — LIPASE, BLOOD: Lipase: 39 U/L (ref 11–59)

## 2012-01-16 MED ORDER — ONDANSETRON HCL 4 MG/2ML IJ SOLN
4.0000 mg | Freq: Once | INTRAMUSCULAR | Status: AC
  Administered 2012-01-16: 4 mg via INTRAVENOUS

## 2012-01-16 MED ORDER — HYDROCODONE-ACETAMINOPHEN 7.5-325 MG PO TABS
1.0000 | ORAL_TABLET | Freq: Three times a day (TID) | ORAL | Status: DC | PRN
  Administered 2012-01-17 (×3): 1 via ORAL
  Filled 2012-01-16 (×3): qty 1

## 2012-01-16 MED ORDER — SODIUM CHLORIDE 0.9 % IV SOLN: Freq: Once | INTRAVENOUS | Status: DC

## 2012-01-16 MED ORDER — ONDANSETRON HCL 4 MG/2ML IJ SOLN
INTRAMUSCULAR | Status: AC
  Filled 2012-01-16: qty 2

## 2012-01-16 MED ORDER — SODIUM CHLORIDE 0.9 % IV BOLUS (SEPSIS)
500.0000 mL | INTRAVENOUS | Status: AC
  Administered 2012-01-16: 500 mL via INTRAVENOUS

## 2012-01-16 MED ORDER — INSULIN NPH (HUMAN) (ISOPHANE) 100 UNIT/ML ~~LOC~~ SUSP
65.0000 [IU] | Freq: Two times a day (BID) | SUBCUTANEOUS | Status: DC
  Filled 2012-01-16: qty 10

## 2012-01-16 MED ORDER — INSULIN ASPART 100 UNIT/ML ~~LOC~~ SOLN
0.0000 [IU] | SUBCUTANEOUS | Status: DC
  Administered 2012-01-17: 5 [IU] via SUBCUTANEOUS
  Administered 2012-01-17: 3 [IU] via SUBCUTANEOUS
  Administered 2012-01-17 – 2012-01-18 (×7): 5 [IU] via SUBCUTANEOUS
  Administered 2012-01-18: 8 [IU] via SUBCUTANEOUS
  Administered 2012-01-18: 11 [IU] via SUBCUTANEOUS
  Administered 2012-01-19: 5 [IU] via SUBCUTANEOUS
  Administered 2012-01-19: 8 [IU] via SUBCUTANEOUS
  Administered 2012-01-19: 5 [IU] via SUBCUTANEOUS
  Administered 2012-01-19 (×3): 8 [IU] via SUBCUTANEOUS
  Administered 2012-01-20 (×2): 5 [IU] via SUBCUTANEOUS
  Administered 2012-01-20 (×2): 8 [IU] via SUBCUTANEOUS
  Administered 2012-01-20 – 2012-01-21 (×3): 5 [IU] via SUBCUTANEOUS
  Administered 2012-01-21 (×3): 3 [IU] via SUBCUTANEOUS
  Filled 2012-01-16: qty 1
  Filled 2012-01-16: qty 3

## 2012-01-16 MED ORDER — HYDROXYCHLOROQUINE SULFATE 200 MG PO TABS
200.0000 mg | ORAL_TABLET | Freq: Two times a day (BID) | ORAL | Status: DC
  Administered 2012-01-16 – 2012-01-21 (×9): 200 mg via ORAL
  Filled 2012-01-16 (×13): qty 1

## 2012-01-16 MED ORDER — CALCITRIOL 0.25 MCG PO CAPS
0.2500 ug | ORAL_CAPSULE | Freq: Every day | ORAL | Status: DC
  Administered 2012-01-17 – 2012-01-21 (×4): 0.25 ug via ORAL
  Filled 2012-01-16 (×6): qty 1

## 2012-01-16 MED ORDER — ASPIRIN EC 81 MG PO TBEC
81.0000 mg | DELAYED_RELEASE_TABLET | Freq: Two times a day (BID) | ORAL | Status: DC
  Filled 2012-01-16 (×2): qty 1

## 2012-01-16 MED ORDER — PANTOPRAZOLE SODIUM 40 MG IV SOLR
40.0000 mg | Freq: Two times a day (BID) | INTRAVENOUS | Status: DC
  Administered 2012-01-16 – 2012-01-19 (×6): 40 mg via INTRAVENOUS
  Filled 2012-01-16 (×9): qty 40

## 2012-01-16 MED ORDER — SODIUM CHLORIDE 0.9 % IV SOLN: INTRAVENOUS | Status: DC

## 2012-01-16 MED ORDER — MORPHINE SULFATE 4 MG/ML IJ SOLN
INTRAMUSCULAR | Status: AC
  Administered 2012-01-16: 4 mg
  Filled 2012-01-16: qty 1

## 2012-01-16 MED ORDER — AMLODIPINE BESYLATE 2.5 MG PO TABS
2.5000 mg | ORAL_TABLET | Freq: Every day | ORAL | Status: DC
  Administered 2012-01-17 – 2012-01-21 (×4): 2.5 mg via ORAL
  Filled 2012-01-16 (×6): qty 1

## 2012-01-16 MED ORDER — IPRATROPIUM-ALBUTEROL 18-103 MCG/ACT IN AERO
2.0000 | INHALATION_SPRAY | Freq: Four times a day (QID) | RESPIRATORY_TRACT | Status: DC | PRN
  Filled 2012-01-16: qty 14.7

## 2012-01-16 MED ORDER — FUROSEMIDE 40 MG PO TABS
60.0000 mg | ORAL_TABLET | Freq: Two times a day (BID) | ORAL | Status: DC
  Administered 2012-01-17 – 2012-01-19 (×5): 60 mg via ORAL
  Filled 2012-01-16 (×8): qty 1

## 2012-01-16 MED ORDER — SODIUM CHLORIDE 0.9 % IV SOLN
1.0000 g | INTRAVENOUS | Status: DC
  Administered 2012-01-16: 19:00:00 via INTRAVENOUS
  Filled 2012-01-16 (×2): qty 1

## 2012-01-16 MED ORDER — MORPHINE SULFATE 2 MG/ML IJ SOLN
1.0000 mg | INTRAMUSCULAR | Status: DC | PRN
  Administered 2012-01-16 – 2012-01-18 (×6): 1 mg via INTRAVENOUS
  Filled 2012-01-16 (×6): qty 1

## 2012-01-16 MED ORDER — SODIUM CHLORIDE 0.9 % IV BOLUS (SEPSIS): 1000.0000 mL | Freq: Once | INTRAVENOUS | Status: DC

## 2012-01-16 MED ORDER — GABAPENTIN 300 MG PO CAPS
300.0000 mg | ORAL_CAPSULE | Freq: Two times a day (BID) | ORAL | Status: DC
  Administered 2012-01-16 – 2012-01-21 (×9): 300 mg via ORAL
  Filled 2012-01-16 (×13): qty 1

## 2012-01-16 MED ORDER — MORPHINE SULFATE 2 MG/ML IJ SOLN: 2.0000 mg | INTRAMUSCULAR | Status: DC | PRN

## 2012-01-16 MED ORDER — HEPARIN SODIUM (PORCINE) 5000 UNIT/ML IJ SOLN
5000.0000 [IU] | Freq: Three times a day (TID) | INTRAMUSCULAR | Status: DC
  Administered 2012-01-16 – 2012-01-17 (×2): 5000 [IU] via SUBCUTANEOUS
  Filled 2012-01-16 (×5): qty 1

## 2012-01-16 MED ORDER — MORPHINE SULFATE 4 MG/ML IJ SOLN
4.0000 mg | Freq: Once | INTRAMUSCULAR | Status: AC
  Administered 2012-01-16: 4 mg via INTRAVENOUS
  Filled 2012-01-16: qty 1

## 2012-01-16 MED ORDER — CARVEDILOL 25 MG PO TABS
25.0000 mg | ORAL_TABLET | Freq: Two times a day (BID) | ORAL | Status: DC
  Administered 2012-01-17 – 2012-01-21 (×9): 25 mg via ORAL
  Filled 2012-01-16 (×12): qty 1

## 2012-01-16 MED ORDER — ONDANSETRON HCL 4 MG/2ML IJ SOLN
4.0000 mg | Freq: Once | INTRAMUSCULAR | Status: AC
  Administered 2012-01-16: 4 mg via INTRAVENOUS
  Filled 2012-01-16: qty 2

## 2012-01-16 MED ORDER — ONDANSETRON HCL 4 MG/2ML IJ SOLN
4.0000 mg | Freq: Four times a day (QID) | INTRAMUSCULAR | Status: DC | PRN
  Administered 2012-01-16 – 2012-01-17 (×3): 4 mg via INTRAVENOUS
  Filled 2012-01-16 (×3): qty 2

## 2012-01-16 NOTE — ED Provider Notes (Signed)
History     CSN: [id number]  Arrival date & time 01/16/12  1309   First MD Initiated Contact with Patient 01/16/12 1418      Chief Complaint  Patient presents with  . Abdominal Pain    (Consider location/radiation/quality/duration/timing/severity/associated sxs/prior treatment) HPI Pt with known gallstones with scheduled chole scheduled for Monday p/w N/V and abdominal pain starting this AM. Pt states it feels similar to previous episodes.  Past Medical History  Diagnosis Date  . CHF (congestive heart failure)   . Diabetes mellitus   . Neuropathy   . Hypertension   . COPD (chronic obstructive pulmonary disease)   . Gallstones   . RA (rheumatoid arthritis)   . Obese   . Kidney stones   . Piriformis syndrome     left hip  . Lumbar disc disease     compression   . Sleep apnea   . Gout     Past Surgical History  Procedure Date  . Umbilical hernia repair   . Pilonidal cyst excision 1997  . Varicose vein surgery     left leg    Family History  Problem Relation Age of Onset  . Hypertension Mother   . Diabetes Father   . Diabetes Brother     History  Substance Use Topics  . Smoking status: Former Smoker    Quit date: 12/26/1994  . Smokeless tobacco: Never Used  . Alcohol Use: No      Review of Systems  Respiratory: Negative for cough, shortness of breath and wheezing.   Cardiovascular: Negative for chest pain.  Gastrointestinal: Positive for nausea, vomiting and abdominal pain. Negative for diarrhea, constipation and abdominal distention.  Skin: Negative for color change, pallor, rash and wound.  Neurological: Negative for weakness, numbness and headaches.    Allergies  Ciprofloxacin and Levofloxacin  Home Medications   No current outpatient prescriptions on file.  BP 148/85  Pulse 111  Temp(Src) 99.8 F (37.7 C) (Oral)  Resp 20  Ht 6\' 1"  (1.854 m)  Wt 345 lb 14.4 oz (156.9 kg)  BMI 45.64 kg/m2  SpO2 93%  Physical Exam  Nursing note and  vitals reviewed. Constitutional: He is oriented to person, place, and time. He appears well-developed and well-nourished. No distress.  HENT:  Head: Normocephalic and atraumatic.  Mouth/Throat: Oropharynx is clear and moist.  Eyes: EOM are normal. Pupils are equal, round, and reactive to light.  Neck: Normal range of motion. Neck supple.  Cardiovascular: Normal rate and regular rhythm.   Pulmonary/Chest: Effort normal and breath sounds normal. No respiratory distress. He has no wheezes. He has no rales.  Abdominal: Soft. Bowel sounds are normal. He exhibits mass. There is tenderness. There is no rebound and no guarding.       ttp over RUQ. No rebound or guarding  Musculoskeletal: Normal range of motion. He exhibits no edema and no tenderness.  Neurological: He is alert and oriented to person, place, and time.  Skin: Skin is warm and dry. No rash noted. No erythema.  Psychiatric: He has a normal mood and affect. His behavior is normal.    ED Course  Procedures (including critical care time)  Labs Reviewed  CBC - Abnormal; Notable for the following:    WBC 11.0 (*)    All other components within normal limits  COMPREHENSIVE METABOLIC PANEL - Abnormal; Notable for the following:    Glucose, Bld 230 (*)    GFR calc non Af Amer 57 (*)  GFR calc Af Amer 67 (*)    All other components within normal limits  CBC - Abnormal; Notable for the following:    WBC 11.4 (*)    Hemoglobin 12.7 (*)    HCT 38.0 (*)    All other components within normal limits  COMPREHENSIVE METABOLIC PANEL - Abnormal; Notable for the following:    Glucose, Bld 225 (*)    Creatinine, Ser 1.39 (*)    Albumin 3.4 (*)    GFR calc non Af Amer 54 (*)    GFR calc Af Amer 63 (*)    All other components within normal limits  GLUCOSE, CAPILLARY - Abnormal; Notable for the following:    Glucose-Capillary 200 (*)    All other components within normal limits  GLUCOSE, CAPILLARY - Abnormal; Notable for the following:     Glucose-Capillary 193 (*)    All other components within normal limits  GLUCOSE, CAPILLARY - Abnormal; Notable for the following:    Glucose-Capillary 207 (*)    All other components within normal limits  LIPASE, BLOOD  CARDIAC PANEL(CRET KIN+CKTOT+MB+TROPI)  APTT  PROTIME-INR  LIPASE, BLOOD  POCT CBG MONITORING   Dg Chest 2 View  01/16/2012  *RADIOLOGY REPORT*  Clinical Data: CHF.  COPD.  Hypertension.  Diabetic.  Ex-smoker.  CHEST - 2 VIEW  Comparison: 10/21/2011  Findings: Mildly motion degraded lateral view.  Lateral view degraded by patient arm position.  Frontal view degraded by AP portable technique and patient body habitus.  Apical lordotic patient positioning.  Moderate cardiomegaly.  Apparent superior mediastinal soft tissue fullness is likely due to technique and patient position. No pleural effusion or pneumothorax.  No congestive failure.  IMPRESSION: Cardiomegaly and hyperinflation.  No congestive failure or acute process.  Both views degraded, as detailed above.  Apparent superior mediastinal soft tissue fullness is likely due to technique/patient positioning.  Original Report Authenticated By: Consuello Bossier, M.D.   US Abdomen Complete  01/16/2012  *RADIOLOGY REPORT*  Clinical Data:  Right upper quadrant abdominal pain, evaluate for acute cholecystitis  COMPLETE ABDOMINAL ULTRASOUND  Comparison:  Abdominal ultrasound - 10/22/2011; abdominal CT - 10/21/2011  Findings:  Examination is minimally degraded secondary to patient body habitus.  Gallbladder:  There is an echogenic, mobile approximately 2.2 cm stone within the gallbladder. There are additional small gallstones as well as a minimal amount of gallbladder sludge.  No gallbladder wall thickening.  No pericholecystic fluid.  Negative sonographic Murphy's sign.  Common bile duct:  Normal in size, measuring 6.3 mm in diameter.  Liver:  There is mild diffuse heterogeneity of the hepatic echotexture.  There is an approximately 1.2 x 1.2  x 1.1 cm area of increased echogenicity within the peripheral aspect of the left lobe of the liver.  No ascites.  IVC:  Appears normal.  Pancreas:  Largely obscured secondary to overlying bowel gas.  Spleen:  Normal in size, measuring 11 cm in length.  Right Kidney:  Normal cortical thickness, echogenicity and size, measuring 13.2 cm in length.  No focal renal lesions.  No echogenic renal stones.  No urinary obstruction.  Left Kidney:  Normal cortical thickness, echogenicity and size, measuring 14.6 cm in length.  No focal renal lesions.  No echogenic renal stones.  No urinary obstruction.  Abdominal aorta:  No aneurysm identified.  IMPRESSION: 1.  Cholelithiasis without evidence of cholecystitis. 2.  Apparent 1.2 cm area of increased echogenicity within the left lobe of the liver is indeterminate but in the absence  of a known primary malignancy likely represents a small hemangioma.  A followup ultrasound is 6 months may be obtained to ensure continued stability.  If a more aggressive workup is desired, further evaluation may be obtained with an abdominal MRI.  These results will be called to the ordering clinician or representative by the Radiologist Assistant, and communication documented in the PACS Dashboard.  Original Report Authenticated By: Waynard Reeds, M.D.     1. Cholelithiases       MDM    Discussed with Dr Derrell Lolling. Will see in ED and determine disposition. Suggest [facility] consult for pre-op eval.    Discussed with [facility]. Will see in consult     Loren Racer, MD 01/17/12 [phone]

## 2012-01-16 NOTE — ED Notes (Signed)
Patient transported to X-ray 

## 2012-01-16 NOTE — ED Notes (Signed)
Pt vomited x's 1. EDP notified and orders given to administer zofran.

## 2012-01-16 NOTE — H&P (Addendum)
PCP: Dr. Loyal Buba  CARDIOLOGIST Dr. Adella Hare in [facility]  Chief Complaint:  Abdominal pain  HPI: Mr. Jutras is a 61 year old male with multiple medical problems most notable for CHF, COPD, OSA, diabetes, morbid obesity and cholelithiasis, he has had intermittent right upper quadrant pain for over a year, saw Dr. Gerrit Friends back in November 2012 and was scheduled to have an elective laparoscopic Cholecystectomy on Monday, patient has had worsening right upper quadrant pain associated with nausea and vomiting since Saturday and hence presented to the emergency room today. He had an ultrasound done in the ER which did not show evidence of acute acute cholecystitis, he was seen by Dr. Derrell Lolling with general surgery who requested [facility] admit the patient due to multiple medical problems. Patient denies chest pain or shortness of breath at this time, reports chronic dyspnea on exertion, denies chest pain with exertion.  Allergies:   Allergies  Allergen Reactions  . Ciprofloxacin Hives  . Levofloxacin (Levaquin) Hives      Past Medical History  Diagnosis Date  . CHF (congestive heart failure)   . Diabetes mellitus   . Neuropathy   . Hypertension   . COPD (chronic obstructive pulmonary disease)   . Gallstones   . RA (rheumatoid arthritis)   . Obese   . Kidney stones   . Piriformis syndrome     left hip  . Lumbar disc disease     compression   . Sleep apnea   . Gout     Past Surgical History  Procedure Date  . Umbilical hernia repair   . Pilonidal cyst excision 1997  . Varicose vein surgery     left leg    Prior to Admission medications   Medication Sig Start Date End Date Taking? Authorizing Provider  acetaminophen (TYLENOL) 500 MG tablet Take 1,000 mg by mouth every 6 (six) hours as needed. Pain   Yes Historical Provider, MD  albuterol-ipratropium (COMBIVENT) 18-103 MCG/ACT inhaler Inhale 2 puffs into the lungs every 6 (six) hours as needed. Wheezing   Yes Historical  Provider, MD  amLODipine (NORVASC) 2.5 MG tablet Take 2.5 mg by mouth daily with breakfast.    Yes Historical Provider, MD  aspirin 81 MG tablet Take 81 mg by mouth 2 (two) times daily.    Yes Historical Provider, MD  calcitRIOL (ROCALTROL) 0.25 MCG capsule Take 0.25 mcg by mouth daily with breakfast.    Yes Historical Provider, MD  carvedilol (COREG) 25 MG tablet Take 25 mg by mouth 2 (two) times daily with a meal.     Yes Historical Provider, MD  furosemide (LASIX) 80 MG tablet Take 80 mg by mouth 2 (two) times daily.     Yes Historical Provider, MD  gabapentin (NEURONTIN) 300 MG capsule Take 300 mg by mouth 2 (two) times daily.     Yes Historical Provider, MD  HYDROcodone-acetaminophen (NORCO) 7.5-325 MG per tablet Take 1 tablet by mouth every 8 (eight) hours as needed. Pain    Yes Historical Provider, MD  HYDROXYCHLOROQUINE SULFATE PO Take 300 mg by mouth 2 (two) times daily.   Yes Historical Provider, MD  insulin aspart (NOVOLOG) 100 UNIT/ML injection Inject 50 Units into the skin 3 (three) times daily before meals.   Yes Historical Provider, MD  insulin NPH (HUMULIN N,NOVOLIN N) 100 UNIT/ML injection Inject 90 Units into the skin 2 (two) times daily.     Yes Historical Provider, MD  lisinopril (PRINIVIL,ZESTRIL) 10 MG tablet Take 10 mg by mouth 2 (two)  times daily.     Yes Historical Provider, MD  omeprazole (PRILOSEC) 20 MG capsule Take 20 mg by mouth every morning.     Yes Historical Provider, MD  pravastatin (PRAVACHOL) 80 MG tablet Take 80 mg by mouth at bedtime.     Yes Historical Provider, MD  traMADol (ULTRAM) 50 MG tablet Take 50 mg by mouth every 8 (eight) hours as needed. Maximum dose= 8 tablets per day. Pain    Yes Historical Provider, MD    Social History: Married lives at home with his wife, former smoker quit in 96, denies alcohol use, ambulates independently, and is independent in all ADLs  Family History  Problem Relation Age of Onset  . Hypertension Mother   . Diabetes  Father   . Diabetes Brother     Review of Systems: Positives bolded Constitutional: Denies fever, chills, diaphoresis, appetite change and fatigue.  HEENT: Denies photophobia, eye pain, redness, hearing loss, ear pain, congestion, sore throat, rhinorrhea, sneezing, mouth sores, trouble swallowing, neck pain, neck stiffness and tinnitus.   Respiratory: Denies SOB, DOE, cough, chest tightness,  and wheezing.   Cardiovascular: Denies chest pain, palpitations and leg swelling.  Gastrointestinal: Denies nausea, vomiting, abdominal pain, diarrhea, constipation, blood in stool and abdominal distention.  Genitourinary: Denies dysuria, urgency, frequency, hematuria, flank pain and difficulty urinating.  Musculoskeletal: Denies myalgias, back pain, joint swelling, arthralgias and gait problem.  Skin: Denies pallor, rash and wound.  Neurological: Denies dizziness, seizures, syncope, weakness, light-headedness, numbness and headaches.  Hematological: Denies adenopathy. Easy bruising, personal or family bleeding history  Psychiatric/Behavioral: Denies suicidal ideation, mood changes, confusion, nervousness, sleep disturbance and agitation   Physical Exam: Blood pressure 176/61, pulse 82, temperature 99.8 F (37.7 C), temperature source Oral, resp. rate 20, height 6\' 1"  (1.854 m), weight 158.759 kg (350 lb), SpO2 99.00%. Morbidly obese male lying on the stretcher in no acute distress HEENT pupils equal reactive to light oral mucosa is dry Neck obese no JVD or organomegaly CVS S1-S2 distant heart sounds Lungs distant breath sounds Abdomen obese soft mild right upper quadrant tenderness no rigidity or rebound positive bowel sounds Extremities with trace lower extremity edema, signs of chronic venous stasis Neuro moves all extremities no localizing signs  Labs on Admission:  Results for orders placed during the hospital encounter of 01/16/12 (from the past 48 hour(s))  CBC     Status: Abnormal    Collection Time   01/16/12  1:50 PM      Component Value Range Comment   WBC 11.0 (*) 4.0 - 10.5 (K/uL)    RBC 4.80  4.22 - 5.81 (MIL/uL)    Hemoglobin 14.0  13.0 - 17.0 (g/dL)    HCT 57.8  46.9 - 62.9 (%)    MCV 86.5  78.0 - 100.0 (fL)    MCH 29.2  26.0 - 34.0 (pg)    MCHC 33.7  30.0 - 36.0 (g/dL)    RDW 52.8  41.3 - 24.4 (%)    Platelets 177  150 - 400 (K/uL)   COMPREHENSIVE METABOLIC PANEL     Status: Abnormal   Collection Time   01/16/12  1:50 PM      Component Value Range Comment   Sodium 137  135 - 145 (mEq/L)    Potassium 3.7  3.5 - 5.1 (mEq/L)    Chloride 96  96 - 112 (mEq/L)    CO2 28  19 - 32 (mEq/L)    Glucose, Bld 230 (*) 70 - 99 (mg/dL)  BUN 21  6 - 23 (mg/dL)    Creatinine, Ser 1.19  0.50 - 1.35 (mg/dL)    Calcium 9.6  8.4 - 10.5 (mg/dL)    Total Protein 7.9  6.0 - 8.3 (g/dL)    Albumin 3.8  3.5 - 5.2 (g/dL)    AST 20  0 - 37 (U/L)    ALT 25  0 - 53 (U/L)    Alkaline Phosphatase 61  39 - 117 (U/L)    Total Bilirubin 0.6  0.3 - 1.2 (mg/dL)    GFR calc non Af Amer 57 (*) >90 (mL/min)    GFR calc Af Amer 67 (*) >90 (mL/min)   LIPASE, BLOOD     Status: Normal   Collection Time   01/16/12  1:50 PM      Component Value Range Comment   Lipase 39  11 - 59 (U/L)   CARDIAC PANEL(CRET KIN+CKTOT+MB+TROPI)     Status: Normal   Collection Time   01/16/12  5:40 PM      Component Value Range Comment   Total CK 31  7 - 232 (U/L)    CK, MB 1.3  0.3 - 4.0 (ng/mL)    Troponin I <0.30  <0.30 (ng/mL)    Relative Index RELATIVE INDEX IS INVALID  0.0 - 2.5    APTT     Status: Normal   Collection Time   01/16/12  5:50 PM      Component Value Range Comment   aPTT 26  24 - 37 (seconds)   PROTIME-INR     Status: Normal   Collection Time   01/16/12  5:50 PM      Component Value Range Comment   Prothrombin Time 13.3  11.6 - 15.2 (seconds)    INR 0.99  0.00 - 1.49      Radiological Exams on Admission: Dg Chest 2 View  01/16/2012  *RADIOLOGY REPORT*  Clinical Data: CHF.  COPD.   Hypertension.  Diabetic.  Ex-smoker.  CHEST - 2 VIEW  Comparison: 10/21/2011  Findings: Mildly motion degraded lateral view.  Lateral view degraded by patient arm position.  Frontal view degraded by AP portable technique and patient body habitus.  Apical lordotic patient positioning.  Moderate cardiomegaly.  Apparent superior mediastinal soft tissue fullness is likely due to technique and patient position. No pleural effusion or pneumothorax.  No congestive failure.  IMPRESSION: Cardiomegaly and hyperinflation.  No congestive failure or acute process.  Both views degraded, as detailed above.  Apparent superior mediastinal soft tissue fullness is likely due to technique/patient positioning.  Original Report Authenticated By: Consuello Bossier, M.D.   US Abdomen Complete  01/16/2012  *RADIOLOGY REPORT*  Clinical Data:  Right upper quadrant abdominal pain, evaluate for acute cholecystitis  COMPLETE ABDOMINAL ULTRASOUND  Comparison:  Abdominal ultrasound - 10/22/2011; abdominal CT - 10/21/2011  Findings:  Examination is minimally degraded secondary to patient body habitus.  Gallbladder:  There is an echogenic, mobile approximately 2.2 cm stone within the gallbladder. There are additional small gallstones as well as a minimal amount of gallbladder sludge.  No gallbladder wall thickening.  No pericholecystic fluid.  Negative sonographic Murphy's sign.  Common bile duct:  Normal in size, measuring 6.3 mm in diameter.  Liver:  There is mild diffuse heterogeneity of the hepatic echotexture.  There is an approximately 1.2 x 1.2 x 1.1 cm area of increased echogenicity within the peripheral aspect of the left lobe of the liver.  No ascites.  IVC:  Appears  normal.  Pancreas:  Largely obscured secondary to overlying bowel gas.  Spleen:  Normal in size, measuring 11 cm in length.  Right Kidney:  Normal cortical thickness, echogenicity and size, measuring 13.2 cm in length.  No focal renal lesions.  No echogenic renal stones.  No  urinary obstruction.  Left Kidney:  Normal cortical thickness, echogenicity and size, measuring 14.6 cm in length.  No focal renal lesions.  No echogenic renal stones.  No urinary obstruction.  Abdominal aorta:  No aneurysm identified.  IMPRESSION: 1.  Cholelithiasis without evidence of cholecystitis. 2.  Apparent 1.2 cm area of increased echogenicity within the left lobe of the liver is indeterminate but in the absence of a known primary malignancy likely represents a small hemangioma.  A followup ultrasound is 6 months may be obtained to ensure continued stability.  If a more aggressive workup is desired, further evaluation may be obtained with an abdominal MRI.  These results will be called to the ordering clinician or representative by the Radiologist Assistant, and communication documented in the PACS Dashboard.  Original Report Authenticated By: Waynard Reeds, M.D.    Assessment/Plan 1. Symptomatic cholelithiasis: Plan per [facility] for laparoscopic cholecystectomy, will decrease his IV fluids, will need to be very careful with excessive narcotic use due to poor pulmonary reserve. 2. Nonischemic cardiomyopathy: EF of 54% based on last echo one year ago, he had a cardiac cath in 2003 which showed normal coronaries but his ejection fraction was less than 20%, and his last cardiac cath was at [facility] in 2008 which per patient report showed minimal nonobstructive CAD(apparently one of the vessels had 30% disease and the others were clean, per pt report)  Will request Cath Report from [facility], EKG with no acute ST-T wave changes. I will change his IV fluids to [facility]. Will continue aspirin Coreg and decrease lasix from 80mg  BID to 60 mg BID while n.p.o. His cardiac risk would be low to intermediate. 3. COPD/OSA/morbid obesity: He would be considered moderate to high risk from a pulmonary standpoint due to these underlying diseases, currently not in acute flare, use Combivent PRN Will need to be very careful  with narcotics and other sedatives that can cause respiratory depression. 4. Diabetes mellitus type 2: Decrease his basal insulin dose by 30% and NovoLog sliding scale. 5. OSA: CPAP each bedtime 6. DVT prophylaxis: Lovenox 7. code status is full   Time Spent on Admission:  Jaquel Glassburn [facility] Pager: [phone] 01/16/2012, 8:21 PM

## 2012-01-16 NOTE — ED Notes (Signed)
Pt reports he is scheduled for a cholecystectomy next Monday. States pain has increased and has had a lot of vomiting this morning. Reports taking a percocet at home at 06:00 this morning with no relief.

## 2012-01-16 NOTE — ED Notes (Signed)
Blood sugar 200.  Insulin held.  Patient has not eaten anything since Sunday and did not want to risk his blood sugar going too low.

## 2012-01-16 NOTE — Consult Note (Signed)
Reason for Consult:   Right upper quadrant pain, nausea and vomiting  Referring Physician: Dr. Ranae Palms, ED physician  Mitchell Parker is an 61 y.o. male. I was asked to see him by Dr. Ranae Palms in the emergency department.  HPI: this is a 61 year old Caucasian male with numerous medical problems including insulin-dependent diabetes, morbid obesity, congestive heart failure, hypertension, COPD, rheumatoid arthritis, and gout.  He has had intermittent problems with right upper quadrant pain over the past several months. He has gallstones on ultrasound. He was evaluated at this hospital in October 2012 at which time he was having a severe gouty attack also had abdominal pain. At that time a HIDA scan was negative. He was discharged home but was advised that he would need a cholecystectomy at some point.  On 01/04/2012 he was seen in our office by Dr. Darnell Level who has scheduled him for elective laparoscopic cholecystectomy. That surgery was scheduled to be one week from today.  He presented to the [facility] emergency room today complaining of right upper quadrant pain since yesterday morning. He says he he awoke yesterday morning with nausea and vomiting and then about 2 hours later developed RUQ pain that has persisted. He says his emesis is bilious and brown and he had multiple episodes, perhaps more than 20. He's been constipated for the past 48 hours but has been passing flatus. He says his bowel movements are normal brown color. He denies diarrhea.  He has received morphine in the emergency room a couple times he feels better now.  Gallbladder ultrasound to my shows gallstones but no evidence of inflammatory change around the gallbladder. There is a 1.2 cm focus in the left lobe of the liver that is possibly a hemangioma. Laboratory reveals WBC of 11,000, normal liver function tests, normal lipase, and glucose of 230.  Past Medical History  Diagnosis Date  . CHF (congestive heart failure)   .  Diabetes mellitus   . Neuropathy   . Hypertension   . COPD (chronic obstructive pulmonary disease)   . Gallstones   . RA (rheumatoid arthritis)   . Obese   . Kidney stones   . Piriformis syndrome     left hip  . Lumbar disc disease     compression   . Sleep apnea   . Gout     Past Surgical History  Procedure Date  . Umbilical hernia repair   . Pilonidal cyst excision 1997  . Varicose vein surgery     left leg    Family History  Problem Relation Age of Onset  . Hypertension Mother   . Diabetes Father   . Diabetes Brother     Social History:  reports that he quit smoking about 17 years ago. He does not have any smokeless tobacco history on file. He reports that he does not drink alcohol or use illicit drugs.  Allergies:  Allergies  Allergen Reactions  . Ciprofloxacin Hives  . Levofloxacin (Levaquin) Hives    Medications: I have reviewed the patient's current medications.he is on numerous medications including Norvasc, aspirin, Rocaltrol, Coreg, Lasix, Neurontin, hydrocodone, hydro-chloroquine, NovoLog insulin, NPH insulin, lisinopril, Prilosec, Pravachol, and Ultram.  Allergies include Cipro and Levaquin which cause hives.   Review of Systems:   Pertinent review of systems includes positive for right upper quadrant abdominal pain, nausea and vomiting, constipation. Negative for diarrhea. Negative for jaundice. Negative for acholic stools. Negative for fever or chills.  Results for orders placed during the hospital encounter of  01/16/12 (from the past 48 hour(s))  CBC     Status: Abnormal   Collection Time   01/16/12  1:50 PM      Component Value Range Comment   WBC 11.0 (*) 4.0 - 10.5 (K/uL)    RBC 4.80  4.22 - 5.81 (MIL/uL)    Hemoglobin 14.0  13.0 - 17.0 (g/dL)    HCT 47.8  29.5 - 62.1 (%)    MCV 86.5  78.0 - 100.0 (fL)    MCH 29.2  26.0 - 34.0 (pg)    MCHC 33.7  30.0 - 36.0 (g/dL)    RDW 30.8  65.7 - 84.6 (%)    Platelets 177  150 - 400 (K/uL)     COMPREHENSIVE METABOLIC PANEL     Status: Abnormal   Collection Time   01/16/12  1:50 PM      Component Value Range Comment   Sodium 137  135 - 145 (mEq/L)    Potassium 3.7  3.5 - 5.1 (mEq/L)    Chloride 96  96 - 112 (mEq/L)    CO2 28  19 - 32 (mEq/L)    Glucose, Bld 230 (*) 70 - 99 (mg/dL)    BUN 21  6 - 23 (mg/dL)    Creatinine, Ser 9.62  0.50 - 1.35 (mg/dL)    Calcium 9.6  8.4 - 10.5 (mg/dL)    Total Protein 7.9  6.0 - 8.3 (g/dL)    Albumin 3.8  3.5 - 5.2 (g/dL)    AST 20  0 - 37 (U/L)    ALT 25  0 - 53 (U/L)    Alkaline Phosphatase 61  39 - 117 (U/L)    Total Bilirubin 0.6  0.3 - 1.2 (mg/dL)    GFR calc non Af Amer 57 (*) >90 (mL/min)    GFR calc Af Amer 67 (*) >90 (mL/min)   LIPASE, BLOOD     Status: Normal   Collection Time   01/16/12  1:50 PM      Component Value Range Comment   Lipase 39  11 - 59 (U/L)   CARDIAC PANEL(CRET KIN+CKTOT+MB+TROPI)     Status: Normal   Collection Time   01/16/12  5:40 PM      Component Value Range Comment   Total CK 31  7 - 232 (U/L)    CK, MB 1.3  0.3 - 4.0 (ng/mL)    Troponin I <0.30  <0.30 (ng/mL)    Relative Index RELATIVE INDEX IS INVALID  0.0 - 2.5    APTT     Status: Normal   Collection Time   01/16/12  5:50 PM      Component Value Range Comment   aPTT 26  24 - 37 (seconds)   PROTIME-INR     Status: Normal   Collection Time   01/16/12  5:50 PM      Component Value Range Comment   Prothrombin Time 13.3  11.6 - 15.2 (seconds)    INR 0.99  0.00 - 1.49      US Abdomen Complete  01/16/2012  *RADIOLOGY REPORT*  Clinical Data:  Right upper quadrant abdominal pain, evaluate for acute cholecystitis  COMPLETE ABDOMINAL ULTRASOUND  Comparison:  Abdominal ultrasound - 10/22/2011; abdominal CT - 10/21/2011  Findings:  Examination is minimally degraded secondary to patient body habitus.  Gallbladder:  There is an echogenic, mobile approximately 2.2 cm stone within the gallbladder. There are additional small gallstones as well as a minimal  amount of gallbladder sludge.  No gallbladder wall thickening.  No pericholecystic fluid.  Negative sonographic Murphy's sign.  Common bile duct:  Normal in size, measuring 6.3 mm in diameter.  Liver:  There is mild diffuse heterogeneity of the hepatic echotexture.  There is an approximately 1.2 x 1.2 x 1.1 cm area of increased echogenicity within the peripheral aspect of the left lobe of the liver.  No ascites.  IVC:  Appears normal.  Pancreas:  Largely obscured secondary to overlying bowel gas.  Spleen:  Normal in size, measuring 11 cm in length.  Right Kidney:  Normal cortical thickness, echogenicity and size, measuring 13.2 cm in length.  No focal renal lesions.  No echogenic renal stones.  No urinary obstruction.  Left Kidney:  Normal cortical thickness, echogenicity and size, measuring 14.6 cm in length.  No focal renal lesions.  No echogenic renal stones.  No urinary obstruction.  Abdominal aorta:  No aneurysm identified.  IMPRESSION: 1.  Cholelithiasis without evidence of cholecystitis. 2.  Apparent 1.2 cm area of increased echogenicity within the left lobe of the liver is indeterminate but in the absence of a known primary malignancy likely represents a small hemangioma.  A followup ultrasound is 6 months may be obtained to ensure continued stability.  If a more aggressive workup is desired, further evaluation may be obtained with an abdominal MRI.  These results will be called to the ordering clinician or representative by the Radiologist Assistant, and communication documented in the PACS Dashboard.  Original Report Authenticated By: Waynard Reeds, M.D.    Physical Exam  Blood pressure 176/61, pulse 82, temperature 99.8 F (37.7 C), temperature source Oral, resp. rate 20, height 6\' 1"  (1.854 m), weight 350 lb (158.759 kg), SpO2 99.00%.  Constitutional: Awake and alert. Mild distress. Eyes: Sclerae clear. Extraocular movements intact. HEENT:  nose lips oropharynx without gross lesions. Neck:    large. No palpable mass. No obvious jugular venous distention. Trachea midline. Lungs: Clear to auscultation bilaterally. Heart: Regular rate and rhythm. No murmur. Heart sounds distant. Abdomen: Morbidly obese. Tender with mild guarding in the right upper quadrant. Non tender elsewhere.  No palpable mass. Well-healed scar at the umbilicus. No evidence of recurrent hernia. Small excoriated area above the umbilicus in the midline, not actively infected. No obvious hepatosplenomegaly. Extremities:   mild ankle edema. No deformity. Nontender Neurologic:   no gross motor or sensory deficits. No tremor.  Assessment/Plan:  Chronic cholecystitis with cholelithiasis. He is either having accelerating biliary colic or may be developing a low grade acute cholecystitis. It would be in his best interest to be admitted to the hospital, have his numerous medical problems assessed, and then hopefully proceed with cholecystectomy during this admission.  Morbid obesity  Congestive heart failure  Insulin-dependent diabetes  Hypertension  Chronic obstructive pulmonary disease  Premature arthritis  Gout  Status post umbilical herniorrhaphy with mesh  I have discussed his case with Dr. Jomarie Longs of the [facility]. She has kindly agreed to admit the patient and evaluate and manage his multiple medical problems. [facility], PA will follow along with [facility] and we'll plan to proceed with cholecystectomy during this admission.  I discussed this plan with the patient and his wife. They were in full agreement with this plan.   Angelia Mould. Derrell Lolling, M.D., [facility] General and Minimally invasive Surgery Breast and Colorectal Surgery Office:   [phone] Pager:   [phone]    01/16/2012, 6:38 PM

## 2012-01-16 NOTE — ED Notes (Signed)
Patient transported to Ultrasound 

## 2012-01-16 NOTE — ED Notes (Signed)
NPO after MN.

## 2012-01-17 ENCOUNTER — Other Ambulatory Visit (HOSPITAL_COMMUNITY): Payer: Medicare Other

## 2012-01-17 ENCOUNTER — Encounter (HOSPITAL_COMMUNITY): Payer: Self-pay | Admitting: Anesthesiology

## 2012-01-17 LAB — COMPREHENSIVE METABOLIC PANEL
ALT: 20 U/L (ref 0–53)
ALT: 17 U/L (ref 0–53)
AST: 14 U/L (ref 0–37)
AST: 12 U/L (ref 0–37)
Albumin: 3.4 g/dL — ABNORMAL LOW (ref 3.5–5.2)
Albumin: 3.1 g/dL — ABNORMAL LOW (ref 3.5–5.2)
Alkaline Phosphatase: 54 U/L (ref 39–117)
Alkaline Phosphatase: 50 U/L (ref 39–117)
BUN: 18 mg/dL (ref 6–23)
BUN: 17 mg/dL (ref 6–23)
CO2: 28 mEq/L (ref 19–32)
CO2: 29 mEq/L (ref 19–32)
Calcium: 9.2 mg/dL (ref 8.4–10.5)
Calcium: 9 mg/dL (ref 8.4–10.5)
Chloride: 98 mEq/L (ref 96–112)
Chloride: 98 mEq/L (ref 96–112)
Creatinine, Ser: 1.39 mg/dL — ABNORMAL HIGH (ref 0.50–1.35)
Creatinine, Ser: 1.49 mg/dL — ABNORMAL HIGH (ref 0.50–1.35)
GFR calc Af Amer: 63 mL/min — ABNORMAL LOW (ref >90)
GFR calc Af Amer: 58 mL/min — ABNORMAL LOW (ref >90)
GFR calc non Af Amer: 54 mL/min — ABNORMAL LOW (ref >90)
GFR calc non Af Amer: 50 mL/min — ABNORMAL LOW (ref >90)
Glucose, Bld: 225 mg/dL — ABNORMAL HIGH (ref 70–99)
Glucose, Bld: 244 mg/dL — ABNORMAL HIGH (ref 70–99)
Potassium: 3.6 mEq/L (ref 3.5–5.1)
Potassium: 3.7 mEq/L (ref 3.5–5.1)
Sodium: 137 mEq/L (ref 135–145)
Sodium: 136 mEq/L (ref 135–145)
Total Bilirubin: 0.7 mg/dL (ref 0.3–1.2)
Total Bilirubin: 0.8 mg/dL (ref 0.3–1.2)
Total Protein: 7.3 g/dL (ref 6.0–8.3)
Total Protein: 7 g/dL (ref 6.0–8.3)

## 2012-01-17 LAB — CBC
HCT: 38 % — ABNORMAL LOW (ref 39.0–52.0)
HCT: 37.3 % — ABNORMAL LOW (ref 39.0–52.0)
Hemoglobin: 12.7 g/dL — ABNORMAL LOW (ref 13.0–17.0)
Hemoglobin: 12.7 g/dL — ABNORMAL LOW (ref 13.0–17.0)
MCH: 29.3 pg (ref 26.0–34.0)
MCH: 29.9 pg (ref 26.0–34.0)
MCHC: 33.4 g/dL (ref 30.0–36.0)
MCHC: 34 g/dL (ref 30.0–36.0)
MCV: 87.6 fL (ref 78.0–100.0)
MCV: 87.8 fL (ref 78.0–100.0)
Platelets: 162 10*3/uL (ref 150–400)
Platelets: 144 10*3/uL — ABNORMAL LOW (ref 150–400)
RBC: 4.34 MIL/uL (ref 4.22–5.81)
RBC: 4.25 MIL/uL (ref 4.22–5.81)
RDW: 15.3 % (ref 11.5–15.5)
RDW: 15.4 % (ref 11.5–15.5)
WBC: 11.4 10*3/uL — ABNORMAL HIGH (ref 4.0–10.5)
WBC: 13.6 10*3/uL — ABNORMAL HIGH (ref 4.0–10.5)

## 2012-01-17 LAB — URINE MICROSCOPIC-ADD ON

## 2012-01-17 LAB — URINALYSIS, ROUTINE W REFLEX MICROSCOPIC
Bilirubin Urine: NEGATIVE
Glucose, UA: NEGATIVE mg/dL
Ketones, ur: NEGATIVE mg/dL
Nitrite: NEGATIVE
Protein, ur: NEGATIVE mg/dL
Specific Gravity, Urine: 1.015 (ref 1.005–1.030)
Urobilinogen, UA: 0.2 mg/dL (ref 0.0–1.0)
pH: 5.5 (ref 5.0–8.0)

## 2012-01-17 LAB — SURGICAL PCR SCREEN
MRSA, PCR: NEGATIVE
Staphylococcus aureus: POSITIVE — AB

## 2012-01-17 LAB — GLUCOSE, CAPILLARY
Glucose-Capillary: 193 mg/dL — ABNORMAL HIGH (ref 70–99)
Glucose-Capillary: 207 mg/dL — ABNORMAL HIGH (ref 70–99)
Glucose-Capillary: 211 mg/dL — ABNORMAL HIGH (ref 70–99)
Glucose-Capillary: 216 mg/dL — ABNORMAL HIGH (ref 70–99)
Glucose-Capillary: 219 mg/dL — ABNORMAL HIGH (ref 70–99)
Glucose-Capillary: 220 mg/dL — ABNORMAL HIGH (ref 70–99)
Glucose-Capillary: 227 mg/dL — ABNORMAL HIGH (ref 70–99)

## 2012-01-17 LAB — PROTIME-INR
INR: 1.07 (ref 0.00–1.49)
Prothrombin Time: 14.1 seconds (ref 11.6–15.2)

## 2012-01-17 LAB — LIPASE, BLOOD: Lipase: 35 U/L (ref 11–59)

## 2012-01-17 LAB — AMYLASE: Amylase: 60 U/L (ref 0–105)

## 2012-01-17 MED ORDER — COLCHICINE 0.6 MG PO TABS
1.2000 mg | ORAL_TABLET | Freq: Once | ORAL | Status: AC
  Administered 2012-01-17: 1.2 mg via ORAL
  Filled 2012-01-17: qty 2

## 2012-01-17 MED ORDER — CEFAZOLIN SODIUM-DEXTROSE 2-3 GM-% IV SOLR
2.0000 g | INTRAVENOUS | Status: AC
  Administered 2012-01-18: 2 g via INTRAVENOUS

## 2012-01-17 MED ORDER — PROMETHAZINE HCL 25 MG/ML IJ SOLN
12.5000 mg | Freq: Four times a day (QID) | INTRAMUSCULAR | Status: DC | PRN
  Administered 2012-01-17: 12.5 mg via INTRAVENOUS
  Filled 2012-01-17: qty 1

## 2012-01-17 MED ORDER — COLCHICINE 0.6 MG PO TABS
0.6000 mg | ORAL_TABLET | ORAL | Status: DC | PRN
  Filled 2012-01-17: qty 1

## 2012-01-17 MED ORDER — CHLORHEXIDINE GLUCONATE 4 % EX LIQD
1.0000 "application " | Freq: Once | CUTANEOUS | Status: DC
  Filled 2012-01-17: qty 15

## 2012-01-17 MED ORDER — SODIUM CHLORIDE 0.9 % IV SOLN
3.0000 g | Freq: Four times a day (QID) | INTRAVENOUS | Status: DC
  Administered 2012-01-17 – 2012-01-18 (×5): 3 g via INTRAVENOUS
  Filled 2012-01-17 (×10): qty 3

## 2012-01-17 MED ORDER — ONDANSETRON HCL 4 MG/2ML IJ SOLN
4.0000 mg | Freq: Once | INTRAMUSCULAR | Status: AC
  Administered 2012-01-17: 4 mg via INTRAVENOUS
  Filled 2012-01-17: qty 2

## 2012-01-17 MED ORDER — INSULIN NPH (HUMAN) (ISOPHANE) 100 UNIT/ML ~~LOC~~ SUSP
30.0000 [IU] | Freq: Once | SUBCUTANEOUS | Status: AC
  Administered 2012-01-17: 30 [IU] via SUBCUTANEOUS

## 2012-01-17 MED ORDER — INSULIN NPH (HUMAN) (ISOPHANE) 100 UNIT/ML ~~LOC~~ SUSP: 30.0000 [IU] | Freq: Two times a day (BID) | SUBCUTANEOUS | Status: DC

## 2012-01-17 MED ORDER — ASPIRIN EC 81 MG PO TBEC
81.0000 mg | DELAYED_RELEASE_TABLET | Freq: Two times a day (BID) | ORAL | Status: DC
  Administered 2012-01-17 (×2): 81 mg via ORAL
  Filled 2012-01-17 (×4): qty 1

## 2012-01-17 MED ORDER — INSULIN NPH (HUMAN) (ISOPHANE) 100 UNIT/ML ~~LOC~~ SUSP
30.0000 [IU] | Freq: Two times a day (BID) | SUBCUTANEOUS | Status: DC
  Administered 2012-01-18 – 2012-01-19 (×3): 30 [IU] via SUBCUTANEOUS

## 2012-01-17 MED ORDER — CHLORHEXIDINE GLUCONATE 4 % EX LIQD
1.0000 "application " | Freq: Once | CUTANEOUS | Status: AC
  Administered 2012-01-18: 1 via TOPICAL
  Filled 2012-01-17: qty 15

## 2012-01-17 MED ORDER — HEPARIN SODIUM (PORCINE) 5000 UNIT/ML IJ SOLN
5000.0000 [IU] | Freq: Three times a day (TID) | INTRAMUSCULAR | Status: AC
  Administered 2012-01-17 (×2): 5000 [IU] via SUBCUTANEOUS
  Filled 2012-01-17 (×6): qty 1

## 2012-01-17 NOTE — Anesthesia Preprocedure Evaluation (Deleted)
Anesthesia Evaluation  Patient identified by MRN, date of birth, ID band Patient awake    Reviewed: Allergy & Precautions, H&P , NPO status , Patient's Chart, lab work & pertinent test results, reviewed documented beta blocker date and time   Airway Mallampati: IV TM Distance: >3 FB Neck ROM: full   Comment: Potential difficult intubation Dental No notable dental hx. (+) Teeth Intact and Dental Advisory Given   Pulmonary neg pulmonary ROS, sleep apnea and Continuous Positive Airway Pressure Ventilation , COPD COPD inhaler,  Mild to mod. COPD.  Denies significant resp. Problems. clear to auscultation  Pulmonary exam normal       Cardiovascular Exercise Tolerance: Good hypertension, On Home Beta Blockers +CHF and neg cardio ROS regular Normal Had viral cardiomyopathy in 2003 with 17% EF.  He now has 54% EF.   Neuro/Psych Negative Neurological ROS  Negative Psych ROS   GI/Hepatic negative GI ROS, Neg liver ROS,   Endo/Other  Negative Endocrine ROSDiabetes mellitus-, Well Controlled, Type 2, Insulin DependentMorbid obesity  Renal/GU negative Renal ROS  Genitourinary negative   Musculoskeletal  (+) Arthritis -, Rheumatoid disorders,    Abdominal (+) obese,   Peds  Hematology negative hematology ROS (+)   Anesthesia Other Findings Platelets 144K  Reproductive/Obstetrics negative OB ROS                          Anesthesia Physical Anesthesia Plan  ASA: III  Anesthesia Plan: General   Post-op Pain Management:    Induction: Intravenous  Airway Management Planned: Oral ETT  Additional Equipment:   Intra-op Plan:   Post-operative Plan: Extubation in OR  Informed Consent: I have reviewed the patients History and Physical, chart, labs and discussed the procedure including the risks, benefits and alternatives for the proposed anesthesia with the patient or authorized representative who has  indicated his/her understanding and acceptance.   Dental Advisory Given  Plan Discussed with: CRNA and Surgeon  Anesthesia Plan Comments:         Anesthesia Quick Evaluation

## 2012-01-17 NOTE — Progress Notes (Signed)
Patient ID: Mitchell Parker, male   DOB: 1951-06-12, 61 y.o.   MRN: [id number]    Subjective: Pt c/o nausea and emesis.  Some RUQ pain.  Objective: Vital signs in last 24 hours: Temp:  [98.8 F (37.1 C)-99.8 F (37.7 C)] 99.8 F (37.7 C) (01/22 0549) Pulse Rate:  [82-111] 111  (01/22 0549) Resp:  [20] 20  (01/22 0549) BP: (148-176)/(61-85) 148/85 mmHg (01/22 0549) SpO2:  [93 %-99 %] 93 % (01/22 0549) Weight:  [345 lb 14.4 oz (156.9 kg)-350 lb (158.759 kg)] 345 lb 14.4 oz (156.9 kg) (01/21 2223) Last BM Date: 01/15/12  Intake/Output from previous day:   Intake/Output this shift:    PE: Abd: soft, tender in RUQ, +BS, obese, soft Heart: regular Lungs: CTAB  Lab Results:   Basename 01/17/12 0452 01/16/12 1350  WBC 11.4* 11.0*  HGB 12.7* 14.0  HCT 38.0* 41.5  PLT 162 177   BMET  Basename 01/17/12 0452 01/16/12 1350  NA 137 137  K 3.6 3.7  CL 98 96  CO2 28 28  GLUCOSE 225* 230*  BUN 18 21  CREATININE 1.39* 1.33  CALCIUM 9.2 9.6   PT/INR  Basename 01/16/12 1750  LABPROT 13.3  INR 0.99     Studies/Results: Dg Chest 2 View  01/16/2012  *RADIOLOGY REPORT*  Clinical Data: CHF.  COPD.  Hypertension.  Diabetic.  Ex-smoker.  CHEST - 2 VIEW  Comparison: 10/21/2011  Findings: Mildly motion degraded lateral view.  Lateral view degraded by patient arm position.  Frontal view degraded by AP portable technique and patient body habitus.  Apical lordotic patient positioning.  Moderate cardiomegaly.  Apparent superior mediastinal soft tissue fullness is likely due to technique and patient position. No pleural effusion or pneumothorax.  No congestive failure.  IMPRESSION: Cardiomegaly and hyperinflation.  No congestive failure or acute process.  Both views degraded, as detailed above.  Apparent superior mediastinal soft tissue fullness is likely due to technique/patient positioning.  Original Report Authenticated By: Consuello Bossier, M.D.   US Abdomen Complete  01/16/2012  *RADIOLOGY  REPORT*  Clinical Data:  Right upper quadrant abdominal pain, evaluate for acute cholecystitis  COMPLETE ABDOMINAL ULTRASOUND  Comparison:  Abdominal ultrasound - 10/22/2011; abdominal CT - 10/21/2011  Findings:  Examination is minimally degraded secondary to patient body habitus.  Gallbladder:  There is an echogenic, mobile approximately 2.2 cm stone within the gallbladder. There are additional small gallstones as well as a minimal amount of gallbladder sludge.  No gallbladder wall thickening.  No pericholecystic fluid.  Negative sonographic Murphy's sign.  Common bile duct:  Normal in size, measuring 6.3 mm in diameter.  Liver:  There is mild diffuse heterogeneity of the hepatic echotexture.  There is an approximately 1.2 x 1.2 x 1.1 cm area of increased echogenicity within the peripheral aspect of the left lobe of the liver.  No ascites.  IVC:  Appears normal.  Pancreas:  Largely obscured secondary to overlying bowel gas.  Spleen:  Normal in size, measuring 11 cm in length.  Right Kidney:  Normal cortical thickness, echogenicity and size, measuring 13.2 cm in length.  No focal renal lesions.  No echogenic renal stones.  No urinary obstruction.  Left Kidney:  Normal cortical thickness, echogenicity and size, measuring 14.6 cm in length.  No focal renal lesions.  No echogenic renal stones.  No urinary obstruction.  Abdominal aorta:  No aneurysm identified.  IMPRESSION: 1.  Cholelithiasis without evidence of cholecystitis. 2.  Apparent 1.2 cm area of  increased echogenicity within the left lobe of the liver is indeterminate but in the absence of a known primary malignancy likely represents a small hemangioma.  A followup ultrasound is 6 months may be obtained to ensure continued stability.  If a more aggressive workup is desired, further evaluation may be obtained with an abdominal MRI.  These results will be called to the ordering clinician or representative by the Radiologist Assistant, and communication documented  in the PACS Dashboard.  Original Report Authenticated By: Waynard Reeds, M.D.    Anti-infectives: Anti-infectives     Start     Dose/Rate Route Frequency Ordered Stop   01/16/12 2300   hydroxychloroquine (PLAQUENIL) tablet 200 mg        200 mg Oral 2 times daily 01/16/12 2220     01/16/12 1900   ertapenem (INVANZ) 1 g in sodium chloride 0.9 % 50 mL IVPB  Status:  Discontinued        1 g 100 mL/hr over 30 Minutes Intravenous Every 24 hours 01/16/12 1858 01/17/12 0741           Assessment/Plan  1. Possible acute on chronic cholecystitis with cholelithiasis 2. COPD 3. CHF  Plan: 1. Will plan for surgery tomorrow.  Would like him to be evaluated by medicine today and make sure that he is stable for surgery from cardiac and pulmonary standpoint. 2. Will make NPO p MN for OR tomorrow 3. Will hold heparin/ASA in am preop. 4. Had preop EKG and CXR 5. Cancelled HIDA scan as this is not necessary given other findings. 6. Will keep npo for today except ice chips today given that he still has nausea and emesis.   LOS: 1 day    Lakeva Hollon E 01/17/2012

## 2012-01-17 NOTE — Progress Notes (Signed)
Subjective: Pt is complaining of severe gout pain in the joints of his left arm.  He has erythema and warmth of 1 TMP joint of left hand and left elbow.    Objective: Vital signs in last 24 hours: Temp:  [98.8 F (37.1 C)-99.8 F (37.7 C)] 99.4 F (37.4 C) (01/22 1428) Pulse Rate:  [82-111] 86  (01/22 1428) Resp:  [20] 20  (01/22 1428) BP: (128-170)/(62-85) 128/81 mmHg (01/22 1428) SpO2:  [93 %-97 %] 95 % (01/22 1428) Weight:  [156.9 kg (345 lb 14.4 oz)] 156.9 kg (345 lb 14.4 oz) (01/21 2223) Weight change:  Last BM Date: 01/15/12  Intake/Output from previous day:   Intake/Output this shift: Total I/O In: -  Out: 2 [Urine:2]  General appearance: alert, cooperative, appears stated age, moderate distress and morbidly obese Head: Normocephalic, without obvious abnormality, atraumatic Eyes: conjunctivae/corneas clear. PERRL, EOM's intact. Fundi benign. Neck: no adenopathy, no carotid bruit, no JVD, supple, symmetrical, trachea midline and thyroid not enlarged, symmetric, no tenderness/mass/nodules Resp: clear to auscultation bilaterally Cardio: regular rate and rhythm, S1, S2 normal, no murmur, click, rub or gallop GI: Abdomen is morbidly obese, soft, nontender to palpation, unable to evaluate for organomegaly given pt body habitus.  No hernias palpated. Extremities: extremities normal, atraumatic, no cyanosis or edema and Homans sign is negative, no sign of DVT Pulses: 2+ and symmetric Skin: Skin color, texture, turgor normal. No rashes or lesions or except for left upper extremities.  Pt has noteable erythema and warmth to the TMP joint of the first digit of the left hand, his left elbow and left shoulder.  Lab Results:  [facility] 01/17/12 1316 01/17/12 0452  WBC 13.6* 11.4*  HGB 12.7* 12.7*  HCT 37.3* 38.0*  PLT 144* 162   BMET  Basename 01/17/12 1316 01/17/12 0452  NA 136 137  K 3.7 3.6  CL 98 98  CO2 29 28  GLUCOSE 244* 225*  BUN 17 18  CREATININE 1.49* 1.39*    CALCIUM 9.0 9.2    Studies/Results: Dg Chest 2 View  01/16/2012  *RADIOLOGY REPORT*  Clinical Data: CHF.  COPD.  Hypertension.  Diabetic.  Ex-smoker.  CHEST - 2 VIEW  Comparison: 10/21/2011  Findings: Mildly motion degraded lateral view.  Lateral view degraded by patient arm position.  Frontal view degraded by AP portable technique and patient body habitus.  Apical lordotic patient positioning.  Moderate cardiomegaly.  Apparent superior mediastinal soft tissue fullness is likely due to technique and patient position. No pleural effusion or pneumothorax.  No congestive failure.  IMPRESSION: Cardiomegaly and hyperinflation.  No congestive failure or acute process.  Both views degraded, as detailed above.  Apparent superior mediastinal soft tissue fullness is likely due to technique/patient positioning.  Original Report Authenticated By: Consuello Bossier, M.D.   US Abdomen Complete  01/16/2012  *RADIOLOGY REPORT*  Clinical Data:  Right upper quadrant abdominal pain, evaluate for acute cholecystitis  COMPLETE ABDOMINAL ULTRASOUND  Comparison:  Abdominal ultrasound - 10/22/2011; abdominal CT - 10/21/2011  Findings:  Examination is minimally degraded secondary to patient body habitus.  Gallbladder:  There is an echogenic, mobile approximately 2.2 cm stone within the gallbladder. There are additional small gallstones as well as a minimal amount of gallbladder sludge.  No gallbladder wall thickening.  No pericholecystic fluid.  Negative sonographic Murphy's sign.  Common bile duct:  Normal in size, measuring 6.3 mm in diameter.  Liver:  There is mild diffuse heterogeneity of the hepatic echotexture.  There is an approximately  1.2 x 1.2 x 1.1 cm area of increased echogenicity within the peripheral aspect of the left lobe of the liver.  No ascites.  IVC:  Appears normal.  Pancreas:  Largely obscured secondary to overlying bowel gas.  Spleen:  Normal in size, measuring 11 cm in length.  Right Kidney:  Normal cortical  thickness, echogenicity and size, measuring 13.2 cm in length.  No focal renal lesions.  No echogenic renal stones.  No urinary obstruction.  Left Kidney:  Normal cortical thickness, echogenicity and size, measuring 14.6 cm in length.  No focal renal lesions.  No echogenic renal stones.  No urinary obstruction.  Abdominal aorta:  No aneurysm identified.  IMPRESSION: 1.  Cholelithiasis without evidence of cholecystitis. 2.  Apparent 1.2 cm area of increased echogenicity within the left lobe of the liver is indeterminate but in the absence of a known primary malignancy likely represents a small hemangioma.  A followup ultrasound is 6 months may be obtained to ensure continued stability.  If a more aggressive workup is desired, further evaluation may be obtained with an abdominal MRI.  These results will be called to the ordering clinician or representative by the Radiologist Assistant, and communication documented in the PACS Dashboard.  Original Report Authenticated By: Waynard Reeds, M.D.    Medications: I have reviewed the patient's current medications.  Assessment/Plan: 1. Gouty flare.  Pt states that he uses ultram at home for pain.  I will also treat him with colchicine. 2. Cholecystitis with cholelithiasis.  For cholecystectomy in the morning per surgery.  Pt is at some increased risk given his morbid obesity, history of COPD and CMO.  He will receive perioperative beta blockers and neb treatments.  Caution is to be used with narcotics given patient's history of OSA. 3.Nonischemic CMO - continue home meds.  Post operative CK and troponins. 4. COPD - perioperative nebs, pt to use home OSA device and settings. 5. Diabetes Mellitis Type 2. Hold long acting insulin overnight before surgery.  Follow with SSI only . 6. Lovenox for DVT prophylaxis.  LOS: 1 day   Albertina Leise 01/17/2012, 2:54 PM

## 2012-01-17 NOTE — Progress Notes (Signed)
ANTIBIOTIC CONSULT NOTE - INITIAL  Pharmacy Consult for Unasyn Indication: Possible cholecystitis  Allergies  Allergen Reactions  . Ciprofloxacin Hives  . Levofloxacin (Levaquin) Hives    Patient Measurements: Height: 6\' 1"  (185.4 cm) Weight: 345 lb 14.4 oz (156.9 kg) IBW/kg (Calculated) : 79.9   Vital Signs: Temp: 99.8 F (37.7 C) (01/22 0549) Temp src: Oral (01/22 0549) BP: 148/85 mmHg (01/22 0549) Pulse Rate: 111  (01/22 0549) Intake/Output from previous day:   Intake/Output from this shift:    Labs:  Basename 01/17/12 0452 01/16/12 1350  WBC 11.4* 11.0*  HGB 12.7* 14.0  PLT 162 177  LABCREA -- --  CREATININE 1.39* 1.33   Estimated Creatinine Clearance: 90.7 ml/min (by C-G formula based on Cr of 1.39). No results found for this basename: VANCOTROUGH:2,VANCOPEAK:2,VANCORANDOM:2,GENTTROUGH:2,GENTPEAK:2,GENTRANDOM:2,TOBRATROUGH:2,TOBRAPEAK:2,TOBRARND:2,AMIKACINPEAK:2,AMIKACINTROU:2,AMIKACIN:2, in the last 72 hours   Microbiology: No results found for this or any previous visit (from the past 720 hour(s)).  Medical History: Past Medical History  Diagnosis Date  . CHF (congestive heart failure)   . Diabetes mellitus   . Neuropathy   . Hypertension   . COPD (chronic obstructive pulmonary disease)   . Gallstones   . RA (rheumatoid arthritis)   . Obese   . Kidney stones   . Piriformis syndrome     left hip  . Lumbar disc disease     compression   . Sleep apnea   . Gout     Medications:  Anti-infectives     Start     Dose/Rate Route Frequency Ordered Stop   01/16/12 2300   hydroxychloroquine (PLAQUENIL) tablet 200 mg        200 mg Oral 2 times daily 01/16/12 2220     01/16/12 1900   ertapenem (INVANZ) 1 g in sodium chloride 0.9 % 50 mL IVPB  Status:  Discontinued        1 g 100 mL/hr over 30 Minutes Intravenous Every 24 hours 01/16/12 1858 01/17/12 0741         Assessment: 61 yo male scheduled for lap cholycystectomy next week, presented to ER  with RUQ pain, N/V. Gallstones noted, no evident inflammation. WBC, temp mildly elevated  Goal of Therapy:  Unasyn dose appropriate for pt size, renal function.  Plan:  Unasyn 3gm IV q6h.  Chilton Si, Jefry Lesinski L 01/17/2012,8:34 AM

## 2012-01-17 NOTE — Progress Notes (Signed)
Per RN pt stated he did not wish to wear CPAP machine at this time. Pt is nauseated at this time. Pt also stated he would have wife bring machine from home.

## 2012-01-17 NOTE — Progress Notes (Signed)
Inpatient Diabetes Program Recommendations  AACE/ADA: New Consensus Statement on Inpatient Glycemic Control (2009)  Target Ranges:  Prepandial:   less than 140 mg/dL      Peak postprandial:   less than 180 mg/dL (1-2 hours)      Critically ill patients:  140 - 180 mg/dL   Reason for Visit: Hyperglycemia  Results for PAXTYN, BOYAR (MRN [id number]) as of 01/17/2012 12:55  Ref. Range 01/16/2012 21:24 01/17/2012 00:31 01/17/2012 04:14 01/17/2012 07:36 01/17/2012 11:31  Glucose-Capillary Latest Range: 70-99 mg/dL 409 (H) 811 (H) 914 (H) 220 (H) 219 (H)    Inpatient Diabetes Program Recommendations Correction (SSI): Increase Novolog to resistant Q 4 hours. HgbA1C: Check HgbA1C to assess glucose control prior to hospitalization  Thank you. Ailene Ards, RD, LDN, CDE Inpatient Diabetes Coordinator

## 2012-01-17 NOTE — Progress Notes (Signed)
Will need lap chole at some point in next  Day or so.

## 2012-01-18 ENCOUNTER — Encounter (HOSPITAL_COMMUNITY)
Admission: EM | Disposition: A | Discharge status: Home / Self Care | Payer: Self-pay | Source: Home / Self Care | Attending: Internal Medicine

## 2012-01-18 ENCOUNTER — Other Ambulatory Visit (INDEPENDENT_AMBULATORY_CARE_PROVIDER_SITE_OTHER): Payer: Self-pay | Admitting: Surgery

## 2012-01-18 ENCOUNTER — Inpatient Hospital Stay (HOSPITAL_COMMUNITY): Payer: Medicare Other

## 2012-01-18 ENCOUNTER — Encounter (HOSPITAL_COMMUNITY): Payer: Self-pay | Admitting: Anesthesiology

## 2012-01-18 ENCOUNTER — Encounter (HOSPITAL_COMMUNITY): Payer: Self-pay | Admitting: Surgery

## 2012-01-18 ENCOUNTER — Inpatient Hospital Stay (HOSPITAL_COMMUNITY): Payer: Medicare Other | Admitting: Anesthesiology

## 2012-01-18 HISTORY — PX: CHOLECYSTECTOMY: SHX55

## 2012-01-18 LAB — GLUCOSE, CAPILLARY
Glucose-Capillary: 192 mg/dL — ABNORMAL HIGH (ref 70–99)
Glucose-Capillary: 221 mg/dL — ABNORMAL HIGH (ref 70–99)
Glucose-Capillary: 250 mg/dL — ABNORMAL HIGH (ref 70–99)
Glucose-Capillary: 261 mg/dL — ABNORMAL HIGH (ref 70–99)
Glucose-Capillary: 290 mg/dL — ABNORMAL HIGH (ref 70–99)
Glucose-Capillary: 297 mg/dL — ABNORMAL HIGH (ref 70–99)
Glucose-Capillary: 333 mg/dL — ABNORMAL HIGH (ref 70–99)

## 2012-01-18 SURGERY — LAPAROSCOPIC CHOLECYSTECTOMY WITH INTRAOPERATIVE CHOLANGIOGRAM
Anesthesia: General | Site: Abdomen | Wound class: Clean Contaminated

## 2012-01-18 MED ORDER — MORPHINE SULFATE 2 MG/ML IJ SOLN
1.0000 mg | INTRAMUSCULAR | Status: DC | PRN
  Administered 2012-01-18: 4 mg via INTRAVENOUS
  Administered 2012-01-18: 2 mg via INTRAVENOUS
  Administered 2012-01-18 – 2012-01-19 (×6): 4 mg via INTRAVENOUS
  Filled 2012-01-18 (×6): qty 2
  Filled 2012-01-18: qty 1
  Filled 2012-01-18: qty 2

## 2012-01-18 MED ORDER — PROMETHAZINE HCL 25 MG/ML IJ SOLN: 6.2500 mg | INTRAMUSCULAR | Status: DC | PRN

## 2012-01-18 MED ORDER — SUCCINYLCHOLINE CHLORIDE 20 MG/ML IJ SOLN
INTRAMUSCULAR | Status: DC | PRN
  Administered 2012-01-18: 200 mg via INTRAVENOUS

## 2012-01-18 MED ORDER — DEXAMETHASONE SODIUM PHOSPHATE 10 MG/ML IJ SOLN
INTRAMUSCULAR | Status: DC | PRN
  Administered 2012-01-18: 10 mg via INTRAVENOUS

## 2012-01-18 MED ORDER — GLYCOPYRROLATE 0.2 MG/ML IJ SOLN
INTRAMUSCULAR | Status: DC | PRN
  Administered 2012-01-18: .5 mg via INTRAVENOUS

## 2012-01-18 MED ORDER — LABETALOL HCL 5 MG/ML IV SOLN
INTRAVENOUS | Status: DC | PRN
  Administered 2012-01-18 (×2): 5 mg via INTRAVENOUS

## 2012-01-18 MED ORDER — LACTATED RINGERS IV SOLN
INTRAVENOUS | Status: DC
  Administered 2012-01-18: 13:00:00 via INTRAVENOUS

## 2012-01-18 MED ORDER — PIPERACILLIN-TAZOBACTAM 3.375 G IVPB
3.3750 g | Freq: Three times a day (TID) | INTRAVENOUS | Status: DC
  Administered 2012-01-19 – 2012-01-20 (×5): 3.375 g via INTRAVENOUS
  Filled 2012-01-18 (×9): qty 50

## 2012-01-18 MED ORDER — VANCOMYCIN HCL IN DEXTROSE 1-5 GM/200ML-% IV SOLN: 1000.0000 mg | Freq: Two times a day (BID) | INTRAVENOUS | Status: DC

## 2012-01-18 MED ORDER — FENTANYL CITRATE 0.05 MG/ML IJ SOLN
INTRAMUSCULAR | Status: DC | PRN
  Administered 2012-01-18: 50 ug via INTRAVENOUS
  Administered 2012-01-18: 100 ug via INTRAVENOUS
  Administered 2012-01-18 (×4): 50 ug via INTRAVENOUS
  Administered 2012-01-18: 100 ug via INTRAVENOUS

## 2012-01-18 MED ORDER — SODIUM CHLORIDE 0.9 % IV SOLN
2000.0000 mg | Freq: Once | INTRAVENOUS | Status: AC
  Administered 2012-01-18: 2000 mg via INTRAVENOUS
  Filled 2012-01-18: qty 2000

## 2012-01-18 MED ORDER — POTASSIUM CHLORIDE IN NACL 20-0.9 MEQ/L-% IV SOLN
INTRAVENOUS | Status: DC
  Administered 2012-01-18 – 2012-01-20 (×2): via INTRAVENOUS
  Filled 2012-01-18 (×5): qty 1000

## 2012-01-18 MED ORDER — ASPIRIN EC 81 MG PO TBEC
81.0000 mg | DELAYED_RELEASE_TABLET | Freq: Two times a day (BID) | ORAL | Status: DC
  Administered 2012-01-18 – 2012-01-21 (×6): 81 mg via ORAL
  Filled 2012-01-18 (×10): qty 1

## 2012-01-18 MED ORDER — NEOSTIGMINE METHYLSULFATE 1 MG/ML IJ SOLN
INTRAMUSCULAR | Status: DC | PRN
  Administered 2012-01-18: 4 mg via INTRAVENOUS

## 2012-01-18 MED ORDER — PROPOFOL 10 MG/ML IV BOLUS
INTRAVENOUS | Status: DC | PRN
  Administered 2012-01-18: 200 mg via INTRAVENOUS

## 2012-01-18 MED ORDER — BUPIVACAINE-EPINEPHRINE 0.25% -1:200000 IJ SOLN
INTRAMUSCULAR | Status: DC | PRN
  Administered 2012-01-18: 25 mL

## 2012-01-18 MED ORDER — LACTATED RINGERS IR SOLN
Status: DC | PRN
  Administered 2012-01-18: 1000 mL

## 2012-01-18 MED ORDER — TISSEEL VH 10 ML EX KIT
PACK | CUTANEOUS | Status: DC | PRN
  Administered 2012-01-18: 10 mL

## 2012-01-18 MED ORDER — ROCURONIUM BROMIDE 100 MG/10ML IV SOLN
INTRAVENOUS | Status: DC | PRN
  Administered 2012-01-18: 30 mg via INTRAVENOUS
  Administered 2012-01-18 (×3): 10 mg via INTRAVENOUS
  Administered 2012-01-18: 5 mg via INTRAVENOUS
  Administered 2012-01-18: 10 mg via INTRAVENOUS

## 2012-01-18 MED ORDER — LACTATED RINGERS IV SOLN
INTRAVENOUS | Status: DC | PRN
  Administered 2012-01-18 (×2): via INTRAVENOUS

## 2012-01-18 MED ORDER — ACETAMINOPHEN 10 MG/ML IV SOLN: INTRAVENOUS | Status: DC | PRN

## 2012-01-18 MED ORDER — VANCOMYCIN HCL 1000 MG IV SOLR
1500.0000 mg | Freq: Two times a day (BID) | INTRAVENOUS | Status: DC
  Administered 2012-01-19: 1500 mg via INTRAVENOUS
  Filled 2012-01-18 (×4): qty 1500

## 2012-01-18 MED ORDER — PIPERACILLIN-TAZOBACTAM IN DEX 2-0.25 GM/50ML IV SOLN: 2.2500 g | Freq: Two times a day (BID) | INTRAVENOUS | Status: DC

## 2012-01-18 MED ORDER — ACETAMINOPHEN 10 MG/ML IV SOLN
INTRAVENOUS | Status: DC | PRN
  Administered 2012-01-18: 1000 mg via INTRAVENOUS

## 2012-01-18 MED ORDER — HYDROMORPHONE HCL PF 1 MG/ML IJ SOLN: 0.2500 mg | INTRAMUSCULAR | Status: DC | PRN

## 2012-01-18 MED ORDER — IOHEXOL 300 MG/ML  SOLN
INTRAMUSCULAR | Status: DC | PRN
  Administered 2012-01-18: 19 mL via INTRAVENOUS

## 2012-01-18 SURGICAL SUPPLY — 42 items
APPLIER CLIP ROT 10 11.4 M/L (STAPLE) ×4
CANISTER SUCTION 2500CC (MISCELLANEOUS) ×4 IMPLANT
CHLORAPREP W/TINT 26ML (MISCELLANEOUS) ×2 IMPLANT
CLIP APPLIE ROT 10 11.4 M/L (STAPLE) ×2 IMPLANT
CLOTH BEACON ORANGE TIMEOUT ST (SAFETY) ×2 IMPLANT
CONT SPEC 4OZ CLIKSEAL STRL BL (MISCELLANEOUS) ×2 IMPLANT
COVER MAYO STAND STRL (DRAPES) ×2 IMPLANT
DERMABOND ADVANCED (GAUZE/BANDAGES/DRESSINGS) ×1
DERMABOND ADVANCED .7 DNX12 (GAUZE/BANDAGES/DRESSINGS) ×1 IMPLANT
DISSECTOR BLUNT TIP ENDO 5MM (MISCELLANEOUS) ×2 IMPLANT
DRAIN CHANNEL 19F RND (DRAIN) ×2 IMPLANT
DRAPE C-ARM 42X72 X-RAY (DRAPES) ×2 IMPLANT
DRAPE LAPAROSCOPIC ABDOMINAL (DRAPES) ×2 IMPLANT
DRAPE WARM FLUID 44X44 (DRAPE) ×2 IMPLANT
DUPLOJECT EASY PREP 4ML (MISCELLANEOUS) ×2 IMPLANT
ELECT REM PT RETURN 9FT ADLT (ELECTROSURGICAL) ×2
ELECTRODE REM PT RTRN 9FT ADLT (ELECTROSURGICAL) ×1 IMPLANT
EVACUATOR SILICONE 100CC (DRAIN) ×2 IMPLANT
FILTER SMOKE EVAC LAPAROSHD (FILTER) ×2 IMPLANT
GLOVE BIOGEL PI IND STRL 7.0 (GLOVE) ×1 IMPLANT
GLOVE BIOGEL PI INDICATOR 7.0 (GLOVE) ×1
GLOVE INDICATOR 8.0 STRL GRN (GLOVE) ×4 IMPLANT
GLOVE SS BIOGEL STRL SZ 8 (GLOVE) ×1 IMPLANT
GLOVE SUPERSENSE BIOGEL SZ 8 (GLOVE) ×1
GOWN STRL NON-REIN LRG LVL3 (GOWN DISPOSABLE) ×2 IMPLANT
GOWN STRL REIN XL XLG (GOWN DISPOSABLE) ×4 IMPLANT
HEMOSTAT SNOW SURGICEL 2X4 (HEMOSTASIS) ×14 IMPLANT
HEMOSTAT SURGICEL 4X8 (HEMOSTASIS) IMPLANT
KIT BASIN OR (CUSTOM PROCEDURE TRAY) ×2 IMPLANT
POUCH SPECIMEN RETRIEVAL 10MM (ENDOMECHANICALS) ×2 IMPLANT
SCISSORS LAP 5X35 DISP (ENDOMECHANICALS) ×2 IMPLANT
SET CHOLANGIOGRAPH MIX (MISCELLANEOUS) ×2 IMPLANT
SET IRRIG TUBING LAPAROSCOPIC (IRRIGATION / IRRIGATOR) ×2 IMPLANT
SUT ETHILON 2 0 PS N (SUTURE) ×2 IMPLANT
SUT MNCRL AB 4-0 PS2 18 (SUTURE) ×2 IMPLANT
SUT VICRYL 0 UR6 27IN ABS (SUTURE) ×10 IMPLANT
TOWEL OR 17X26 10 PK STRL BLUE (TOWEL DISPOSABLE) ×2 IMPLANT
TRAY LAP CHOLE (CUSTOM PROCEDURE TRAY) ×2 IMPLANT
TROCAR BLADELESS OPT 5 75 (ENDOMECHANICALS) ×4 IMPLANT
TROCAR XCEL BLUNT TIP 100MML (ENDOMECHANICALS) ×2 IMPLANT
TROCAR XCEL NON-BLD 11X100MML (ENDOMECHANICALS) ×2 IMPLANT
TUBING INSUFFLATION 10FT LAP (TUBING) ×2 IMPLANT

## 2012-01-18 NOTE — Anesthesia Preprocedure Evaluation (Addendum)
Anesthesia Evaluation  Patient identified by MRN, date of birth, ID band Patient awake    Reviewed: Allergy & Precautions, H&P , NPO status , Patient's Chart, lab work & pertinent test results  Airway Mallampati: IV TM Distance: >3 FB Neck ROM: Full    Dental No notable dental hx.    Pulmonary sleep apnea and Continuous Positive Airway Pressure Ventilation , COPD COPD inhaler,  clear to auscultation  Pulmonary exam normal       Cardiovascular hypertension, Pt. on home beta blockers and Pt. on medications +CHF Regular Normal    Neuro/Psych  Neuromuscular disease Negative Psych ROS   GI/Hepatic negative GI ROS, Neg liver ROS,   Endo/Other  Diabetes mellitus-, Type 1, Insulin Dependent  Renal/GU negative Renal ROS  Genitourinary negative   Musculoskeletal  (+) Arthritis -,   Abdominal (+) obese,   Peds negative pediatric ROS (+)  Hematology   Anesthesia Other Findings Gout flare-up today in both knees, both feet, and left elbow and left hand.  Will discuss with Dr. Luisa Hart. Patient sees Dr. Adella Hare at [facility] for cardiology care. Last saw Dr. Adella Hare about a year ago. Echo last year had EF of 54% per patient.  No history of difficult intubation. Had an umbilical hernia repair 7 years ago.  Reproductive/Obstetrics negative OB ROS                          Anesthesia Physical Anesthesia Plan  ASA: III  Anesthesia Plan: General   Post-op Pain Management:    Induction: Intravenous  Airway Management Planned: Oral ETT  Additional Equipment:   Intra-op Plan:   Post-operative Plan: Extubation in OR  Informed Consent: I have reviewed the patients History and Physical, chart, labs and discussed the procedure including the risks, benefits and alternatives for the proposed anesthesia with the patient or authorized representative who has indicated his/her understanding and acceptance.   Dental  advisory given  Plan Discussed with: CRNA  Anesthesia Plan Comments: (EKG: Nonspecific IVCD. CXR: cardiomegaly without failure.)        Anesthesia Quick Evaluation

## 2012-01-18 NOTE — Op Note (Signed)
Laparoscopic Cholecystectomy with IOC Procedure Note  Indications: This patient presents with symptomatic gallbladder disease and will undergo laparoscopic cholecystectomy. He also has acute cholecystitis and worsening pain.The procedure has been discussed with the patient. Operative and non operative treatments have been discussed. Risks of surgery include bleeding, infection,  Common bile duct injury,  Injury to the stomach,liver, colon,small intestine, abdominal wall,  Diaphragm,  Major blood vessels,  And the need for an open procedure.  Other risks include worsening of medical problems, death,  DVT and pulmonary embolism, and cardiovascular events.   Medical options have also been discussed. The patient has been informed of long term expectations of surgery and non surgical options,  The patient agrees to proceed.    Pre-operative Diagnosis: Calculus of gallbladder with acute cholecystitis, without mention of obstruction  Post-operative Diagnosis: same  Plus possible choledocolithiasis  Surgeon: Casin Federici A.   Assistants: Barnetta Chapel PA  Anesthesia: General endotracheal anesthesia and Local anesthesia 0.25.% bupivacaine  ASA Class: 3  Procedure Details  The patient was seen again in the Holding Room. The risks, benefits, complications, treatment options, and expected outcomes were discussed with the patient. The possibilities of reaction to medication, pulmonary aspiration, perforation of viscus, bleeding, recurrent infection, finding a normal gallbladder, the need for additional procedures, failure to diagnose a condition, the possible need to convert to an open procedure, and creating a complication requiring transfusion or operation were discussed with the patient. The patient and/or family concurred with the proposed plan, giving informed consent. The site of surgery properly noted/marked. The patient was taken to Operating Room, identified as Mitchell Parker and the procedure verified  as Laparoscopic Cholecystectomy with Intraoperative Cholangiograms. A Time Out was held and the above information confirmed.  Prior to the induction of general anesthesia, antibiotic prophylaxis was administered. General endotracheal anesthesia was then administered and tolerated well. After the induction, the abdomen was prepped in the usual sterile fashion. The patient was positioned in the supine position with the left arm comfortably tucked, along with some reverse Trendelenburg.  Local anesthetic agent was injected into the skin near the umbilicus and an incision made. The midline fascia was incised and the Hasson technique was used to introduce a 10 mm port under direct vision. It was secured with a figure of eight Vicryl suture placed in the usual fashion. Pneumoperitoneum was then created with CO2 and tolerated well without any adverse changes in the patient's vital signs. Additional trocars were introduced under direct vision. All skin incisions were infiltrated with a local anesthetic agent before making the incision and placing the trocars.   The gallbladder was identified, the fundus grasped and retracted cephalad.  The omentum was densely adherent to the liver edge and gallbladder and took 1 hour to take down.  The transverse colon and duodenum were seen clearly and not injured.  Adhesions were lysed bluntly and with the electrocautery where indicated, taking care not to injure any adjacent organs or viscus. The infundibulum was grasped and retracted laterally, exposing the peritoneum overlying the triangle of Calot. This was then divided and exposed in a blunt fashion. The cystic duct was clearly identified and bluntly dissected circumferentially. The junctions of the gallbladder, cystic duct and common bile duct were clearly identified prior to the division of any linear structure and photo documented.   An incision was made in the cystic duct and the cholangiogram catheter introduced. The  catheter was secured using an endoclip. The study showed a distal obstruction and good visualization  of the distal and proximal biliary tree. The catheter was then removed.   The cystic duct was then doubly ligated with surgical clips and/or Endoloop suture on the patient side and singly clipped on the gallbladder side and divided. The cystic artery was identified, dissected free, ligated with clips and divided as well.   The gallbladder was dissected from the liver bed in retrograde fashion with the electrocautery. The gallbladder was removed. The liver bed was irrigated and inspected. Hemostasis was achieved with the electrocautery.  Significant oozing was noted  Controlled with sugicell  and tisseal  Fibrin glue to the bed and omentum. A drain was placed in the gallbladder fossa.  This was secured to the skin with 2 0 nylon.  Copious irrigation was utilized and was repeatedly aspirated until clear all particulate matter.  Pneumoperitoneum was completely reduced after viewing removal of the trocars under direct vision. The wound was thoroughly irrigated and the fascia was then closed with a figure of eight suture; the skin was then closed with 4 0 monocryl  and a sterile dressing was applied.  Instrument, sponge, and needle counts were correct at closure and at the conclusion of the case.   Findings: Cholecystitis with Cholelithiasis  Estimated Blood Loss: 200 mL         Drains: 42 F to gallbladder fossa         Total IV Fluids:         Specimens: Gallbladder           Complications: bleeding from the omentum controlled with surgicel and Tisseal         Disposition: PACU - hemodynamically stable.         Condition: stable

## 2012-01-18 NOTE — Interval H&P Note (Signed)
History and Physical Interval Note:  01/18/2012 8:25 AM  Mitchell Parker  has presented today for surgery, with the diagnosis of cholecystitis  The various methods of treatment have been discussed with the patient and family. After consideration of risks, benefits and other options for treatment, the patient has consented to  Procedure(s): LAPAROSCOPIC CHOLECYSTECTOMY WITH INTRAOPERATIVE CHOLANGIOGRAM as a surgical intervention .  The patients' history has been reviewed, patient examined, no change in status, stable for surgery.  I have reviewed the patients' chart and labs.  Questions were answered to the patient's satisfaction.     Mitchell Parker A.

## 2012-01-18 NOTE — Consult Note (Signed)
[facility] Consult Note  Referring Provider: No ref. provider found Primary Care Physician:  Provider Not In System Primary Gastroenterologist:  Dr.  Antony Contras Complaint: Possible abnormal intraoperative cholangiogram HPI: Mitchell Parker is an 61 y.o. white male.  Male with CHF COPD morbid obesity and diabetes who underwent cholecystectomy for known gallstones after he presented with worsening abdominal pain. He underwent intraoperative cholangiogram which reportedly showed a stone. The films that I reviewed showed no filling but I did not clearly see a stone nor is imaged and the radiologist report. The patient has had 2 or 3 episodes of biliary colic for which she has been hospitalized and on one occasion had an elevated lipase but on no determinations can I see that he is had an elevated liver function tests. His common bile duct has not been dilated on any imaging studies. Ms. currently fairly comfortable d morphine after his surgery. Past Medical History  Diagnosis Date  . CHF (congestive heart failure)   . Diabetes mellitus   . Neuropathy   . Hypertension   . COPD (chronic obstructive pulmonary disease)   . Gallstones   . RA (rheumatoid arthritis)   . Obese   . Kidney stones   . Piriformis syndrome     left hip  . Lumbar disc disease     compression   . Sleep apnea   . Gout     Past Surgical History  Procedure Date  . Umbilical hernia repair   . Pilonidal cyst excision 1997  . Varicose vein surgery     left leg    Medications Prior to Admission  Medication Dose Route Frequency Provider Last Rate Last Dose  . 0.9 % NaCl with KCl 20 mEq/ L  infusion   Intravenous Continuous Letha Cape, PA 75 mL/hr at 01/18/12 1642    . albuterol-ipratropium (COMBIVENT) inhaler 2 puff  2 puff Inhalation Q6H PRN Zannie Cove, MD      . amLODipine (NORVASC) tablet 2.5 mg  2.5 mg Oral Q breakfast Zannie Cove, MD   2.5 mg at 01/17/12 0811  . Ampicillin-Sulbactam (UNASYN) 3 g  in sodium chloride 0.9 % 100 mL IVPB  3 g Intravenous Q6H Ramiro Harvest, MD   3 g at 01/18/12 1644  . aspirin EC tablet 81 mg  81 mg Oral BID Letha Cape, PA      . calcitRIOL (ROCALTROL) capsule 0.25 mcg  0.25 mcg Oral Q breakfast Zannie Cove, MD   0.25 mcg at 01/17/12 2130  . carvedilol (COREG) tablet 25 mg  25 mg Oral BID WC Zannie Cove, MD   25 mg at 01/18/12 1643  . ceFAZolin (ANCEF) IVPB 2 g/50 mL premix  2 g Intravenous 60 min Pre-Op Velora Heckler, MD   2 g at 01/18/12 0900  . chlorhexidine (HIBICLENS) 4 % liquid 1 application  1 application Topical Once Letha Cape, PA   1 application at 01/18/12 0529  . colchicine tablet 0.6 mg  0.6 mg Oral Q3H PRN Ava Swayze, DO      . colchicine tablet 1.2 mg  1.2 mg Oral Once Ava Swayze, DO   1.2 mg at 01/17/12 1626  . furosemide (LASIX) tablet 60 mg  60 mg Oral BID Zannie Cove, MD   60 mg at 01/18/12 1724  . gabapentin (NEURONTIN) capsule 300 mg  300 mg Oral BID Zannie Cove, MD   300 mg at 01/17/12 2220  . heparin injection 5,000 Units  5,000 Units Subcutaneous  Q8H Ramiro Harvest, MD   5,000 Units at 01/17/12 2220  . HYDROcodone-acetaminophen (NORCO) 7.5-325 MG per tablet 1 tablet  1 tablet Oral Q8H PRN Zannie Cove, MD   1 tablet at 01/17/12 7846  . hydroxychloroquine (PLAQUENIL) tablet 200 mg  200 mg Oral BID Zannie Cove, MD   200 mg at 01/17/12 2220  . insulin aspart (novoLOG) injection 0-15 Units  0-15 Units Subcutaneous Q4H Zannie Cove, MD   11 Units at 01/18/12 1644  . insulin NPH (HUMULIN N,NOVOLIN N) injection 30 Units  30 Units Subcutaneous Once Ramiro Harvest, MD   30 Units at 01/17/12 1015  . insulin NPH (HUMULIN N,NOVOLIN N) injection 30 Units  30 Units Subcutaneous BID WC Ava Swayze, DO   30 Units at 01/18/12 1645  . lactated ringers infusion   Intravenous Continuous Delphia Grates, CRNA 100 mL/hr at 01/18/12 1245    . morphine 2 MG/ML injection 1-4 mg  1-4 mg Intravenous Q2H PRN Letha Cape, PA   4 mg  at 01/18/12 1721  . morphine 4 MG/ML injection 4 mg  4 mg Intravenous Once Loren Racer, MD   4 mg at 01/16/12 1432  . morphine 4 MG/ML injection        4 mg at 01/16/12 1700  . ondansetron (ZOFRAN) injection 4 mg  4 mg Intravenous Once Loren Racer, MD   4 mg at 01/16/12 1431  . ondansetron (ZOFRAN) injection 4 mg  4 mg Intravenous Once Loren Racer, MD   4 mg at 01/16/12 1642  . ondansetron (ZOFRAN) injection 4 mg  4 mg Intravenous Q6H PRN Ernestene Mention, MD   4 mg at 01/17/12 1629  . ondansetron (ZOFRAN) injection 4 mg  4 mg Intravenous Once Ramiro Harvest, MD   4 mg at 01/17/12 0205  . pantoprazole (PROTONIX) injection 40 mg  40 mg Intravenous Q12H Ernestene Mention, MD   40 mg at 01/17/12 2219  . promethazine (PHENERGAN) injection 12.5-25 mg  12.5-25 mg Intravenous Q6H PRN Letha Cape, PA   12.5 mg at 01/17/12 1259  . sodium chloride 0.9 % bolus 500 mL  500 mL Intravenous STAT Loren Racer, MD   500 mL at 01/16/12 1431  . DISCONTD: 0.9 %  sodium chloride infusion   Intravenous Once Loren Racer, MD      . DISCONTD: 0.9 %  sodium chloride infusion   Intravenous Continuous Ernestene Mention, MD      . DISCONTD: aspirin EC tablet 81 mg  81 mg Oral BID Zannie Cove, MD      . DISCONTD: aspirin EC tablet 81 mg  81 mg Oral BID Letha Cape, PA   81 mg at 01/17/12 2220  . DISCONTD: bupivacaine-EPINEPHrine (MARCAINE W/ EPI) 0.25 % (with pres) injection    PRN Thomas A. Cornett, MD   25 mL at 01/18/12 1147  . DISCONTD: chlorhexidine (HIBICLENS) 4 % liquid 1 application  1 application Topical Once Velora Heckler, MD      . DISCONTD: ertapenem [facility]) 1 g in sodium chloride 0.9 % 50 mL IVPB  1 g Intravenous Q24H Ernestene Mention, MD      . DISCONTD: fibrin sealant (TISSEEL) 10 ML kit    PRN Maisie Fus A. Cornett, MD   10 mL at 01/18/12 1137  . DISCONTD: heparin injection 5,000 Units  5,000 Units Subcutaneous Q8H Ernestene Mention, MD   5,000 Units at 01/17/12 [phone]  . DISCONTD:  HYDROmorphone (DILAUDID) injection 0.25-0.5 mg  0.25-0.5 mg Intravenous Q5 min PRN Azell Der, MD      . DISCONTD: insulin NPH (HUMULIN N,NOVOLIN N) injection 30 Units  30 Units Subcutaneous BID WC Ramiro Harvest, MD      . DISCONTD: insulin NPH (HUMULIN N,NOVOLIN N) injection 65 Units  65 Units Subcutaneous BID Zannie Cove, MD      . DISCONTD: iohexol (OMNIPAQUE) 300 MG/ML solution    PRN Maisie Fus A. Cornett, MD   19 mL at 01/18/12 1036  . DISCONTD: lactated ringers irrigation solution    PRN Thomas A. Cornett, MD   1,000 mL at 01/18/12 0949  . DISCONTD: morphine 2 MG/ML injection 1 mg  1 mg Intravenous Q3H PRN Zannie Cove, MD   1 mg at 01/18/12 0244  . DISCONTD: morphine 2 MG/ML injection 2 mg  2 mg Intravenous Q1H PRN Ernestene Mention, MD      . DISCONTD: promethazine (PHENERGAN) injection 12.5 mg  12.5 mg Intravenous Q6H PRN Ramiro Harvest, MD   12.5 mg at 01/17/12 0609  . DISCONTD: promethazine (PHENERGAN) injection 6.25-12.5 mg  6.25-12.5 mg Intravenous Q15 min PRN Azell Der, MD      . DISCONTD: sodium chloride 0.9 % bolus 1,000 mL  1,000 mL Intravenous Once Loren Racer, MD       Medications Prior to Admission  Medication Sig Dispense Refill  . acetaminophen (TYLENOL) 500 MG tablet Take 1,000 mg by mouth every 6 (six) hours as needed. Pain      . albuterol-ipratropium (COMBIVENT) 18-103 MCG/ACT inhaler Inhale 2 puffs into the lungs every 6 (six) hours as needed. Wheezing      . amLODipine (NORVASC) 2.5 MG tablet Take 2.5 mg by mouth daily with breakfast.       . aspirin 81 MG tablet Take 81 mg by mouth 2 (two) times daily.       . calcitRIOL (ROCALTROL) 0.25 MCG capsule Take 0.25 mcg by mouth daily with breakfast.       . carvedilol (COREG) 25 MG tablet Take 25 mg by mouth 2 (two) times daily with a meal.        . furosemide (LASIX) 80 MG tablet Take 80 mg by mouth 2 (two) times daily.        [name] gabapentin (NEURONTIN) 300 MG capsule Take 300 mg by mouth 2 (two) times  daily.        [name] HYDROcodone-acetaminophen (NORCO) 7.5-325 MG per tablet Take 1 tablet by mouth every 8 (eight) hours as needed. Pain       . HYDROXYCHLOROQUINE SULFATE PO Take 300 mg by mouth 2 (two) times daily.      . insulin aspart (NOVOLOG) 100 UNIT/ML injection Inject 50 Units into the skin 3 (three) times daily before meals.      . insulin NPH (HUMULIN N,NOVOLIN N) 100 UNIT/ML injection Inject 90 Units into the skin 2 (two) times daily.        [name] lisinopril (PRINIVIL,ZESTRIL) 10 MG tablet Take 10 mg by mouth 2 (two) times daily.        [name] omeprazole (PRILOSEC) 20 MG capsule Take 20 mg by mouth every morning.        . pravastatin (PRAVACHOL) 80 MG tablet Take 80 mg by mouth at bedtime.        . traMADol (ULTRAM) 50 MG tablet Take 50 mg by mouth every 8 (eight) hours as needed. Maximum dose= 8 tablets per day. Pain         Allergies:  Allergies  Allergen Reactions  . Ciprofloxacin Hives  . Levofloxacin (Levaquin) Hives    Family History  Problem Relation Age of Onset  . Hypertension Mother   . Diabetes Father   . Diabetes Brother     Social History:  reports that he quit smoking about 17 years ago. He has never used smokeless tobacco. He reports that he does not drink alcohol or use illicit drugs.  Negative except as above   Blood pressure 126/69, pulse 82, temperature 98.7 F (37.1 Parker), temperature source Oral, resp. rate 18, height 6\' 1"  (1.854 m), weight 156.9 kg (345 lb 14.4 oz), SpO2 94.00%. Head: Normocephalic, without obvious abnormality, atraumatic Neck: no adenopathy, no carotid bruit, no JVD, supple, symmetrical, trachea midline and thyroid not enlarged, symmetric, no tenderness/mass/nodules Resp: clear to auscultation bilaterally Cardio: regular rate and rhythm, S1, S2 normal, no murmur, click, rub or gallop GI: Abdomen distended, positive bowel sounds. Deep palpation not done after today's surgery Extremities: extremities normal, atraumatic, no cyanosis or  edema  Results for orders placed during the hospital encounter of 01/16/12 (from the past 48 hour(s))  GLUCOSE, CAPILLARY     Status: Abnormal   Collection Time   01/16/12  9:24 PM      Component Value Range Comment   Glucose-Capillary 200 (*) 70 - 99 (mg/dL)    Comment 1 Documented in Chart      Comment 2 Notify RN     GLUCOSE, CAPILLARY     Status: Abnormal   Collection Time   01/17/12 12:31 AM      Component Value Range Comment   Glucose-Capillary 193 (*) 70 - 99 (mg/dL)    Comment 1 Documented in Chart      Comment 2 Notify RN     GLUCOSE, CAPILLARY     Status: Abnormal   Collection Time   01/17/12  4:14 AM      Component Value Range Comment   Glucose-Capillary 207 (*) 70 - 99 (mg/dL)    Comment 1 Documented in Chart      Comment 2 Notify RN     CBC     Status: Abnormal   Collection Time   01/17/12  4:52 AM      Component Value Range Comment   WBC 11.4 (*) 4.0 - 10.5 (K/uL)    RBC 4.34  4.22 - 5.81 (MIL/uL)    Hemoglobin 12.7 (*) 13.0 - 17.0 (g/dL)    HCT 14.7 (*) 82.9 - 52.0 (%)    MCV 87.6  78.0 - 100.0 (fL)    MCH 29.3  26.0 - 34.0 (pg)    MCHC 33.4  30.0 - 36.0 (g/dL)    RDW 56.2  13.0 - 86.5 (%)    Platelets 162  150 - 400 (K/uL)   COMPREHENSIVE METABOLIC PANEL     Status: Abnormal   Collection Time   01/17/12  4:52 AM      Component Value Range Comment   Sodium 137  135 - 145 (mEq/L)    Potassium 3.6  3.5 - 5.1 (mEq/L)    Chloride 98  96 - 112 (mEq/L)    CO2 28  19 - 32 (mEq/L)    Glucose, Bld 225 (*) 70 - 99 (mg/dL)    BUN 18  6 - 23 (mg/dL)    Creatinine, Ser 7.84 (*) 0.50 - 1.35 (mg/dL)    Calcium 9.2  8.4 - 10.5 (mg/dL)    Total Protein 7.3  6.0 - 8.3 (g/dL)  Albumin 3.4 (*) 3.5 - 5.2 (g/dL)    AST 14  0 - 37 (U/L)    ALT 20  0 - 53 (U/L)    Alkaline Phosphatase 54  39 - 117 (U/L)    Total Bilirubin 0.7  0.3 - 1.2 (mg/dL)    GFR calc non Af Amer 54 (*) >90 (mL/min)    GFR calc Af Amer 63 (*) >90 (mL/min)   LIPASE, BLOOD     Status: Normal    Collection Time   01/17/12  4:52 AM      Component Value Range Comment   Lipase 35  11 - 59 (U/L)   GLUCOSE, CAPILLARY     Status: Abnormal   Collection Time   01/17/12  7:36 AM      Component Value Range Comment   Glucose-Capillary 220 (*) 70 - 99 (mg/dL)   GLUCOSE, CAPILLARY     Status: Abnormal   Collection Time   01/17/12 11:31 AM      Component Value Range Comment   Glucose-Capillary 219 (*) 70 - 99 (mg/dL)   SURGICAL PCR SCREEN     Status: Abnormal   Collection Time   01/17/12  1:07 PM      Component Value Range Comment   MRSA, PCR NEGATIVE  NEGATIVE     Staphylococcus aureus POSITIVE (*) NEGATIVE    AMYLASE     Status: Normal   Collection Time   01/17/12  1:16 PM      Component Value Range Comment   Amylase 60  0 - 105 (U/L)   CBC     Status: Abnormal   Collection Time   01/17/12  1:16 PM      Component Value Range Comment   WBC 13.6 (*) 4.0 - 10.5 (K/uL)    RBC 4.25  4.22 - 5.81 (MIL/uL)    Hemoglobin 12.7 (*) 13.0 - 17.0 (g/dL)    HCT 13.0 (*) 86.5 - 52.0 (%)    MCV 87.8  78.0 - 100.0 (fL)    MCH 29.9  26.0 - 34.0 (pg)    MCHC 34.0  30.0 - 36.0 (g/dL)    RDW 78.4  69.6 - 29.5 (%)    Platelets 144 (*) 150 - 400 (K/uL)   COMPREHENSIVE METABOLIC PANEL     Status: Abnormal   Collection Time   01/17/12  1:16 PM      Component Value Range Comment   Sodium 136  135 - 145 (mEq/L)    Potassium 3.7  3.5 - 5.1 (mEq/L)    Chloride 98  96 - 112 (mEq/L)    CO2 29  19 - 32 (mEq/L)    Glucose, Bld 244 (*) 70 - 99 (mg/dL)    BUN 17  6 - 23 (mg/dL)    Creatinine, Ser 2.84 (*) 0.50 - 1.35 (mg/dL)    Calcium 9.0  8.4 - 10.5 (mg/dL)    Total Protein 7.0  6.0 - 8.3 (g/dL)    Albumin 3.1 (*) 3.5 - 5.2 (g/dL)    AST 12  0 - 37 (U/L)    ALT 17  0 - 53 (U/L)    Alkaline Phosphatase 50  39 - 117 (U/L)    Total Bilirubin 0.8  0.3 - 1.2 (mg/dL)    GFR calc non Af Amer 50 (*) >90 (mL/min)    GFR calc Af Amer 58 (*) >90 (mL/min)   PROTIME-INR     Status: Normal   Collection Time  01/17/12  1:16 PM      Component Value Range Comment   Prothrombin Time 14.1  11.6 - 15.2 (seconds)    INR 1.07  0.00 - 1.49    GLUCOSE, CAPILLARY     Status: Abnormal   Collection Time   01/17/12  3:47 PM      Component Value Range Comment   Glucose-Capillary 216 (*) 70 - 99 (mg/dL)   URINALYSIS, ROUTINE W REFLEX MICROSCOPIC     Status: Abnormal   Collection Time   01/17/12  6:43 PM      Component Value Range Comment   Color, Urine YELLOW  YELLOW     APPearance CLEAR  CLEAR     Specific Gravity, Urine 1.015  1.005 - 1.030     pH 5.5  5.0 - 8.0     Glucose, UA NEGATIVE  NEGATIVE (mg/dL)    Hgb urine dipstick MODERATE (*) NEGATIVE     Bilirubin Urine NEGATIVE  NEGATIVE     Ketones, ur NEGATIVE  NEGATIVE (mg/dL)    Protein, ur NEGATIVE  NEGATIVE (mg/dL)    Urobilinogen, UA 0.2  0.0 - 1.0 (mg/dL)    Nitrite NEGATIVE  NEGATIVE     Leukocytes, UA TRACE (*) NEGATIVE    URINE MICROSCOPIC-ADD ON     Status: Abnormal   Collection Time   01/17/12  6:43 PM      Component Value Range Comment   WBC, UA 3-6  <3 (WBC/hpf)    RBC / HPF 3-6  <3 (RBC/hpf)    Casts HYALINE CASTS (*) NEGATIVE     Urine-Other MUCOUS PRESENT     GLUCOSE, CAPILLARY     Status: Abnormal   Collection Time   01/17/12  8:19 PM      Component Value Range Comment   Glucose-Capillary 227 (*) 70 - 99 (mg/dL)    Comment 1 Notify RN     GLUCOSE, CAPILLARY     Status: Abnormal   Collection Time   01/18/12 12:00 AM      Component Value Range Comment   Glucose-Capillary 211 (*) 70 - 99 (mg/dL)    Comment 1 Notify RN     GLUCOSE, CAPILLARY     Status: Abnormal   Collection Time   01/18/12  4:17 AM      Component Value Range Comment   Glucose-Capillary 221 (*) 70 - 99 (mg/dL)    Comment 1 Notify RN     GLUCOSE, CAPILLARY     Status: Abnormal   Collection Time   01/18/12  7:31 AM      Component Value Range Comment   Glucose-Capillary 192 (*) 70 - 99 (mg/dL)   GLUCOSE, CAPILLARY     Status: Abnormal   Collection Time    01/18/12 12:20 PM      Component Value Range Comment   Glucose-Capillary 261 (*) 70 - 99 (mg/dL)    Comment 1 Notify RN      Comment 2 Documented in Chart     GLUCOSE, CAPILLARY     Status: Abnormal   Collection Time   01/18/12  1:58 PM      Component Value Range Comment   Glucose-Capillary 297 (*) 70 - 99 (mg/dL)   GLUCOSE, CAPILLARY     Status: Abnormal   Collection Time   01/18/12  4:11 PM      Component Value Range Comment   Glucose-Capillary 333 (*) 70 - 99 (mg/dL)    Dg Cholangiogram Operative  01/18/2012  *  RADIOLOGY REPORT*  Clinical Data:   Cholelithiasis  INTRAOPERATIVE CHOLANGIOGRAM  Technique:  Cholangiographic images from the Parker-arm fluoroscopic device were submitted for interpretation post-operatively.  Please see the procedural report for the amount of contrast and the fluoroscopy time utilized.  Comparison:  None  Findings:  No persistent filling defects in the common duct. Intrahepatic ducts are incompletely visualized, appearing decompressed centrally. There is no contrast passage beyond the ampulla into the duodenum.  IMPRESSION  1.  Distal CBD obstruction near the ampulla suggesting retained calculus.  Original Report Authenticated By: Osa Craver, M.D.   Dg Chest Port 1 View  01/18/2012  *RADIOLOGY REPORT*  Clinical Data: Postop, ? Aspiration  PORTABLE CHEST - 1 VIEW  Comparison: 01/15/2011  Findings: Patchy bilateral lower lobe and right mid lung opacities, new.  Cardiomediastinal silhouette is unchanged.  IMPRESSION: Patchy bilateral lower lobe and right mid lung opacities, new, possibly reflecting aspiration.  Original Report Authenticated By: Charline Bills, M.D.    Assessment: Question of common bile duct stone and referred by no drainage, unclear whether calculus actually visualized Plan:  Will discuss with Dr. Mignon Pine his visual impressions of the cholangiogram. Given the overall picture, normal liver function tests and other imaging studies as well as  comorbidities would not proceed directly to ERCP at. Might consider an MRCP and expectant management if this is normal, depending on the strength of Dr. Nadara Mustard suspicion of retained common bile duct stone. Mitchell Parker 01/18/2012, 7:49 PM

## 2012-01-18 NOTE — Anesthesia Postprocedure Evaluation (Addendum)
  Anesthesia Post-op Note  Patient: Mitchell Parker  Procedure(s) Performed:  LAPAROSCOPIC CHOLECYSTECTOMY WITH INTRAOPERATIVE CHOLANGIOGRAM  Patient Location: PACU  Anesthesia Type: General  Level of Consciousness: awake and alert   Airway and Oxygen Therapy: Patient Spontanous Breathing  Post-op Pain: mild  Post-op Assessment: Post-op Vital signs reviewed, Patient's Cardiovascular Status Stable, Respiratory Function Stable, Patent Airway and No signs of Nausea or vomiting  Post-op Vital Signs: stable  Complications: No apparent anesthesia complications; clinically Mitchell Parker looks good. He denies SOB. No wheezing heard on auscultation. RR 20. His oxygen saturation is 94-95% on 3-4 liters/min. Nasal cannula.  Chest xray does show patchy opacities consistent with aspiration. Discussed with Dr. Luisa Hart and patient. Patient will be followed clinically.

## 2012-01-18 NOTE — Anesthesia Procedure Notes (Signed)
Performed by: Amillia Biffle       

## 2012-01-18 NOTE — Progress Notes (Signed)
   CARE MANAGEMENT NOTE 01/18/2012  Patient:  Mitchell Parker, Mitchell Parker   Account Number:  [id number]  Date Initiated:  01/18/2012  Documentation initiated by:  Lanier Clam  Subjective/Objective Assessment:   ADMITTED W/ABD PAIN. HX: CHOLELITHIASIS,CHF,COPD,OSA.     Action/Plan:   FROM HOME W/SPOUSE.   Anticipated DC Date:  01/24/2012   Anticipated DC Plan:  HOME/SELF CARE         Choice offered to / List presented to:             Status of service:  In process, will continue to follow Medicare Important Message given?   (If response is "NO", the following Medicare IM given date fields will be blank) Date Medicare IM given:   Date Additional Medicare IM given:    Discharge Disposition:    Per UR Regulation:  Reviewed for med. necessity/level of care/duration of stay  Comments:  01/18/12 Keene Gilkey RN,BSN NCM [phone] CCS-S/P LAP CHOLE.

## 2012-01-18 NOTE — Transfer of Care (Signed)
Immediate Anesthesia Transfer of Care Note  Patient: Mitchell Parker  Procedure(s) Performed:  LAPAROSCOPIC CHOLECYSTECTOMY WITH INTRAOPERATIVE CHOLANGIOGRAM  Patient Location: PACU  Anesthesia Type: General  Level of Consciousness: awake, alert , oriented and patient cooperative  Airway & Oxygen Therapy: Patient Spontanous Breathing and Patient connected to face mask oxygen  Post-op Assessment: Report given to PACU RN and Post -op Vital signs reviewed and stable  Post vital signs: Reviewed and stable  Complications: No apparent anesthesia complications

## 2012-01-18 NOTE — Progress Notes (Signed)
Pt wife did not bring in his home cpap for tonight. Pt said he was fine without it, didn't want our machine.  Jacqulynn Cadet RRT

## 2012-01-18 NOTE — Progress Notes (Signed)
Patient ID: Mitchell Parker, male   DOB: 1951/12/01, 61 y.o.   MRN: [id number]  Subjective: No events overnight.   Objective:  Vital signs in last 24 hours:  Filed Vitals:   01/18/12 1445 01/18/12 1518 01/18/12 1612 01/18/12 1715  BP: 124/70 122/69 116/67 126/69  Pulse: 83 86 90 82  Temp: 100.1 F (37.8 C) 99.8 F (37.7 C) 99.4 F (37.4 C) 98.7 F (37.1 C)  TempSrc: Axillary Axillary Oral Oral  Resp: 18 20 19 18   Height:      Weight:      SpO2: 94% 93% 94% 94%    Intake/Output from previous day:   Intake/Output Summary (Last 24 hours) at 01/18/12 2101 Last data filed at 01/18/12 1700  Gross per 24 hour  Intake   3080 ml  Output    725 ml  Net   2355 ml    Physical Exam: General: Alert, awake, in no acute distress. HEENT: No bruits, no goiter. Moist mucous membranes, no scleral icterus, no conjunctival pallor. Heart: Regular rate and rhythm, S1/S2 +, no murmurs, rubs, gallops. Lungs: Clear to auscultation bilaterally. No wheezing, no rhonchi, no rales.  Abdomen: Soft, nontender, nondistended, positive bowel sounds. Extremities: No clubbing or cyanosis, no pitting edema,  positive pedal pulses.  Lab Results:  Basic Metabolic Panel:    Component Value Date/Time   NA 136 01/17/2012 1316   K 3.7 01/17/2012 1316   CL 98 01/17/2012 1316   CO2 29 01/17/2012 1316   BUN 17 01/17/2012 1316   CREATININE 1.49* 01/17/2012 1316   GLUCOSE 244* 01/17/2012 1316   CALCIUM 9.0 01/17/2012 1316   CBC:    Component Value Date/Time   WBC 13.6* 01/17/2012 1316   HGB 12.7* 01/17/2012 1316   HCT 37.3* 01/17/2012 1316   PLT 144* 01/17/2012 1316   MCV 87.8 01/17/2012 1316   NEUTROABS 13.3* 10/26/2011 0530   LYMPHSABS 0.8 10/26/2011 0530   MONOABS 1.3* 10/26/2011 0530   EOSABS 0.1 10/26/2011 0530   BASOSABS 0.0 10/26/2011 0530      Lab 01/17/12 1316 01/17/12 0452 01/16/12 1350  WBC 13.6* 11.4* 11.0*  HGB 12.7* 12.7* 14.0  HCT 37.3* 38.0* 41.5  PLT 144* 162 177  MCV 87.8 87.6 86.5    MCH 29.9 29.3 29.2  MCHC 34.0 33.4 33.7  RDW 15.4 15.3 15.0  LYMPHSABS -- -- --  MONOABS -- -- --  EOSABS -- -- --  BASOSABS -- -- --  BANDABS -- -- --    Lab 01/17/12 1316 01/17/12 0452 01/16/12 1350  NA 136 137 137  K 3.7 3.6 3.7  CL 98 98 96  CO2 29 28 28   GLUCOSE 244* 225* 230*  BUN 17 18 21   CREATININE 1.49* 1.39* 1.33  CALCIUM 9.0 9.2 9.6  MG -- -- --    Lab 01/17/12 1316 01/16/12 1750  INR 1.07 0.99  PROTIME -- --   Cardiac markers:  Lab 01/16/12 1740  CKMB 1.3  TROPONINI <0.30  MYOGLOBIN --   No components found with this basename: POCBNP:3 Recent Results (from the past 240 hour(s))  SURGICAL PCR SCREEN     Status: Abnormal   Collection Time   01/17/12  1:07 PM      Component Value Range Status Comment   MRSA, PCR NEGATIVE  NEGATIVE  Final    Staphylococcus aureus POSITIVE (*) NEGATIVE  Final     Studies/Results: Dg Cholangiogram Operative 01/18/2012 .  IMPRESSION  1.  Distal CBD obstruction near  the ampulla suggesting retained calculus.    Dg Chest Port 1 View 01/18/2012    IMPRESSION: Patchy bilateral lower lobe and right mid lung opacities, new, possibly reflecting aspiration.   Medications: Scheduled Meds:   . amLODipine  2.5 mg Oral Q breakfast  . ampicillin-sulbactam (UNASYN) IV  3 g Intravenous Q6H  . aspirin EC  81 mg Oral BID  . calcitRIOL  0.25 mcg Oral Q breakfast  . carvedilol  25 mg Oral BID WC  .  ceFAZolin (ANCEF) IV  2 g Intravenous 60 min Pre-Op  . chlorhexidine  1 application Topical Once  . furosemide  60 mg Oral BID  . gabapentin  300 mg Oral BID  . heparin subcutaneous  5,000 Units Subcutaneous Q8H  . hydroxychloroquine  200 mg Oral BID  . insulin aspart  0-15 Units Subcutaneous Q4H  . insulin NPH  30 Units Subcutaneous BID WC  . pantoprazole (PROTONIX) IV  40 mg Intravenous Q12H  . DISCONTD: aspirin EC  81 mg Oral BID  . DISCONTD: chlorhexidine  1 application Topical Once   Continuous Infusions:   . 0.9 % NaCl with  KCl 20 mEq / L 75 mL/hr at 01/18/12 1642  . lactated ringers 100 mL/hr at 01/18/12 1245   PRN Meds:.albuterol-ipratropium, colchicine, HYDROcodone-acetaminophen, morphine injection, ondansetron, promethazine, DISCONTD: bupivacaine-EPINEPHrine, DISCONTD: fibrin sealant, DISCONTD: HYDROmorphone, DISCONTD: iohexol, DISCONTD: lactated ringers, DISCONTD:  morphine injection, DISCONTD: promethazine  Assessment/Plan:  Active Problems:  Cholelithiases - plan to OR today - surgery following - appreciate involvement in management of pt's complex medical problems   CHF (congestive heart failure) - clinically compensated - maintaining saturations > 92% on 2 L [place]   Diabetes mellitus - continue same medication regimen for now   EDUCATION - test results and diagnostic studies were discussed with patient  - patient verbalized the understanding - questions were answered at the bedside and contact information was provided for additional questions or concerns   LOS: 2 days   MAGICK-Ahmya Bernick 01/18/2012, 9:01 PM  [facility] Pager: [phone]

## 2012-01-18 NOTE — Preoperative (Addendum)
Beta Blockers   Reason not to administer Beta Blockers:given this am

## 2012-01-18 NOTE — Progress Notes (Signed)
ANTIBIOTIC CONSULT NOTE - INITIAL  Pharmacy Consult for Vancomycin & Zosyn Indication: rule out pneumonia  Allergies  Allergen Reactions  . Ciprofloxacin Hives  . Levofloxacin (Levaquin) Hives    Patient Measurements: Height: 6\' 1"  (185.4 cm) Weight: 345 lb 14.4 oz (156.9 kg) IBW/kg (Calculated) : 79.9    Vital Signs: Temp: 98.7 F (37.1 C) (01/23 1715) Temp src: Oral (01/23 1715) BP: 126/69 mmHg (01/23 1715) Pulse Rate: 82  (01/23 1715) Intake/Output from previous day: 01/22 0701 - 01/23 0700 In: 640 [P.O.:240; IV Piggyback:400] Out: 578 [Urine:578] Intake/Output from this shift:    Labs:  Basename 01/17/12 1316 01/17/12 0452 01/16/12 1350  WBC 13.6* 11.4* 11.0*  HGB 12.7* 12.7* 14.0  PLT 144* 162 177  LABCREA -- -- --  CREATININE 1.49* 1.39* 1.33   Estimated Creatinine Clearance: 84.6 ml/min (by C-G formula based on Cr of 1.49). No results found for this basename: VANCOTROUGH:2,VANCOPEAK:2,VANCORANDOM:2,GENTTROUGH:2,GENTPEAK:2,GENTRANDOM:2,TOBRATROUGH:2,TOBRAPEAK:2,TOBRARND:2,AMIKACINPEAK:2,AMIKACINTROU:2,AMIKACIN:2, in the last 72 hours   Microbiology: Recent Results (from the past 720 hour(s))  SURGICAL PCR SCREEN     Status: Abnormal   Collection Time   01/17/12  1:07 PM      Component Value Range Status Comment   MRSA, PCR NEGATIVE  NEGATIVE  Final    Staphylococcus aureus POSITIVE (*) NEGATIVE  Final     Medical History: Past Medical History  Diagnosis Date  . CHF (congestive heart failure)   . Diabetes mellitus   . Neuropathy   . Hypertension   . COPD (chronic obstructive pulmonary disease)   . Gallstones   . RA (rheumatoid arthritis)   . Obese   . Kidney stones   . Piriformis syndrome     left hip  . Lumbar disc disease     compression   . Sleep apnea   . Gout     Medications:  Anti-infectives     Start     Dose/Rate Route Frequency Ordered Stop   01/19/12 1000   vancomycin (VANCOCIN) 1,500 mg in sodium chloride 0.9 % 500 mL IVPB        1,500 mg 250 mL/hr over 120 Minutes Intravenous Every 12 hours 01/18/12 2204     01/18/12 2300  piperacillin-tazobactam (ZOSYN) IVPB 3.375 g       3.375 g 12.5 mL/hr over 240 Minutes Intravenous 3 times per day 01/18/12 2154     01/18/12 2300   vancomycin (VANCOCIN) 2,000 mg in sodium chloride 0.9 % 500 mL IVPB        2,000 mg 250 mL/hr over 120 Minutes Intravenous  Once 01/18/12 2204     01/18/12 2200   piperacillin-tazobactam (ZOSYN) IVPB 2.25 g  Status:  Discontinued        2.25 g 100 mL/hr over 30 Minutes Intravenous Every 12 hours 01/18/12 2122 01/18/12 2139   01/18/12 2130   vancomycin (VANCOCIN) IVPB 1000 mg/200 mL premix  Status:  Discontinued        1,000 mg 200 mL/hr over 60 Minutes Intravenous Every 12 hours 01/18/12 2122 01/18/12 2139   01/17/12 1200   ceFAZolin (ANCEF) IVPB 2 g/50 mL premix        2 g 100 mL/hr over 30 Minutes Intravenous 60 min pre-op 01/17/12 1156 01/18/12 0900   01/17/12 1000   Ampicillin-Sulbactam (UNASYN) 3 g in sodium chloride 0.9 % 100 mL IVPB  Status:  Discontinued        3 g 100 mL/hr over 60 Minutes Intravenous Every 6 hours 01/17/12 0839 01/18/12 2122  01/16/12 2300   hydroxychloroquine (PLAQUENIL) tablet 200 mg        200 mg Oral 2 times daily 01/16/12 2220     01/16/12 1900   ertapenem (INVANZ) 1 g in sodium chloride 0.9 % 50 mL IVPB  Status:  Discontinued        1 g 100 mL/hr over 30 Minutes Intravenous Every 24 hours 01/16/12 1858 01/17/12 0741         Assessment: 61 yo male s/p lap cholecystectomy 1/23. Unasyn started empirically pre-op, now discontinued. Cxray with new opacities bilateral lower and right midlung. Beginning Vanc/Zosyn: r/o HCAP vs aspiration. Clearance 85 ml/min (CG) vs 55 ml/min (normalized)  Goal of Therapy:  Vancomycin trough level 15-20 mcg/ml  Plan:  Vancomycin 2000 mg x 1 dose, then 1500 mg q12hr Zosyn 3.375 gm q8h-4 hr infusion. Measure antibiotic drug levels at steady state  Otho Bellows  PharmD Pager [phone] 01/18/2012,10:10 PM

## 2012-01-19 ENCOUNTER — Other Ambulatory Visit (HOSPITAL_COMMUNITY): Payer: Medicare Other

## 2012-01-19 LAB — COMPREHENSIVE METABOLIC PANEL
ALT: 25 U/L (ref 0–53)
AST: 39 U/L — ABNORMAL HIGH (ref 0–37)
Albumin: 2.6 g/dL — ABNORMAL LOW (ref 3.5–5.2)
Alkaline Phosphatase: 63 U/L (ref 39–117)
BUN: 33 mg/dL — ABNORMAL HIGH (ref 6–23)
CO2: 28 mEq/L (ref 19–32)
Calcium: 9.1 mg/dL (ref 8.4–10.5)
Chloride: 97 mEq/L (ref 96–112)
Creatinine, Ser: 2.11 mg/dL — ABNORMAL HIGH (ref 0.50–1.35)
GFR calc Af Amer: 38 mL/min — ABNORMAL LOW (ref >90)
GFR calc non Af Amer: 33 mL/min — ABNORMAL LOW (ref >90)
Glucose, Bld: 273 mg/dL — ABNORMAL HIGH (ref 70–99)
Potassium: 4.5 mEq/L (ref 3.5–5.1)
Sodium: 135 mEq/L (ref 135–145)
Total Bilirubin: 0.3 mg/dL (ref 0.3–1.2)
Total Protein: 7.3 g/dL (ref 6.0–8.3)

## 2012-01-19 LAB — CBC
HCT: 37.4 % — ABNORMAL LOW (ref 39.0–52.0)
Hemoglobin: 12.6 g/dL — ABNORMAL LOW (ref 13.0–17.0)
MCH: 29.3 pg (ref 26.0–34.0)
MCHC: 33.7 g/dL (ref 30.0–36.0)
MCV: 87 fL (ref 78.0–100.0)
Platelets: 169 10*3/uL (ref 150–400)
RBC: 4.3 MIL/uL (ref 4.22–5.81)
RDW: 14.8 % (ref 11.5–15.5)
WBC: 13.6 10*3/uL — ABNORMAL HIGH (ref 4.0–10.5)

## 2012-01-19 LAB — GLUCOSE, CAPILLARY
Glucose-Capillary: 250 mg/dL — ABNORMAL HIGH (ref 70–99)
Glucose-Capillary: 285 mg/dL — ABNORMAL HIGH (ref 70–99)
Glucose-Capillary: 292 mg/dL — ABNORMAL HIGH (ref 70–99)
Glucose-Capillary: 273 mg/dL — ABNORMAL HIGH (ref 70–99)
Glucose-Capillary: 254 mg/dL — ABNORMAL HIGH (ref 70–99)

## 2012-01-19 LAB — VANCOMYCIN, TROUGH: Vancomycin Tr: 22.6 ug/mL — ABNORMAL HIGH (ref 10.0–20.0)

## 2012-01-19 MED ORDER — HYDROMORPHONE HCL PF 2 MG/ML IJ SOLN
2.0000 mg | INTRAMUSCULAR | Status: DC | PRN
  Administered 2012-01-19 – 2012-01-20 (×3): 2 mg via INTRAVENOUS
  Filled 2012-01-19 (×3): qty 1

## 2012-01-19 MED ORDER — INSULIN NPH (HUMAN) (ISOPHANE) 100 UNIT/ML ~~LOC~~ SUSP
35.0000 [IU] | Freq: Two times a day (BID) | SUBCUTANEOUS | Status: DC
  Administered 2012-01-20 – 2012-01-21 (×3): 35 [IU] via SUBCUTANEOUS
  Filled 2012-01-19: qty 10

## 2012-01-19 MED FILL — Fibrin Sealant Component Kit 10 ML: CUTANEOUS | Qty: 1 | Status: AC

## 2012-01-19 MED FILL — Insulin Aspart Inj 100 Unit/ML: SUBCUTANEOUS | Qty: 3 | Status: AC

## 2012-01-19 MED FILL — Iohexol Inj 300 MG/ML: INTRAMUSCULAR | Qty: 50 | Status: AC

## 2012-01-19 NOTE — Progress Notes (Signed)
Reviewed labs and x-rays. Overall I think the likelihood of a retained common bile duct stone is relatively low and had decided to obtain an MRCP prior to considering ERCP.

## 2012-01-19 NOTE — Progress Notes (Signed)
Patient ID: Mitchell Parker, male   DOB: [birth year], 61 y.o.   MRN: [id number]  Subjective: No events overnight. Patient denies chest pain, shortness of breath. Reports abdominal pain.  Objective:  Vital signs in last 24 hours:  Filed Vitals:   01/19/12 0630 01/19/12 1011 01/19/12 1415 01/19/12 1730  BP: 149/76 151/75 145/67 154/81  Pulse: 87 76 76 73  Temp: 98.2 F (36.8 C) 98.5 F (36.9 C) 97.8 F (36.6 C) 98.1 F (36.7 C)  TempSrc: Oral Oral Oral Oral  Resp: 18 18 18 16   Height:      Weight:      SpO2: 95% 95% 97% 97%    Intake/Output from previous day:   Intake/Output Summary (Last 24 hours) at 01/19/12 1808 Last data filed at 01/19/12 1731  Gross per 24 hour  Intake 2007.5 ml  Output    975 ml  Net 1032.5 ml    Physical Exam: General: Alert, awake, oriented x3, in no acute distress. HEENT: No bruits, no goiter. Moist mucous membranes, no scleral icterus, no conjunctival pallor. Heart: Regular rate and rhythm, S1/S2 +, no murmurs, rubs, gallops. Lungs: Clear to auscultation bilaterally. No wheezing, no rhonchi, no rales.  Abdomen: Soft, tender at the site of the surgery, nondistended, positive bowel sounds. Extremities: No clubbing or cyanosis, no pitting edema,  positive pedal pulses. Neuro: Grossly nonfocal.  Lab Results:  Basic Metabolic Panel:    Component Value Date/Time   NA 135 01/19/2012 0514   K 4.5 01/19/2012 0514   CL 97 01/19/2012 0514   CO2 28 01/19/2012 0514   BUN 33* 01/19/2012 0514   CREATININE 2.11* 01/19/2012 0514   GLUCOSE 273* 01/19/2012 0514   CALCIUM 9.1 01/19/2012 0514   CBC:    Component Value Date/Time   WBC 13.6* 01/19/2012 0514   HGB 12.6* 01/19/2012 0514   HCT 37.4* 01/19/2012 0514   PLT 169 01/19/2012 0514   MCV 87.0 01/19/2012 0514   NEUTROABS 13.3* 10/26/2011 0530   LYMPHSABS 0.8 10/26/2011 0530   MONOABS 1.3* 10/26/2011 0530   EOSABS 0.1 10/26/2011 0530   BASOSABS 0.0 10/26/2011 0530      Lab 01/19/12 0514 01/17/12 1316  01/17/12 0452 01/16/12 1350  WBC 13.6* 13.6* 11.4* 11.0*  HGB 12.6* 12.7* 12.7* 14.0  HCT 37.4* 37.3* 38.0* 41.5  PLT 169 144* 162 177  MCV 87.0 87.8 87.6 86.5  MCH 29.3 29.9 29.3 29.2  MCHC 33.7 34.0 33.4 33.7  RDW 14.8 15.4 15.3 15.0  LYMPHSABS -- -- -- --  MONOABS -- -- -- --  EOSABS -- -- -- --  BASOSABS -- -- -- --  BANDABS -- -- -- --    Lab 01/19/12 0514 01/17/12 1316 01/17/12 0452 01/16/12 1350  NA 135 136 137 137  K 4.5 3.7 3.6 3.7  CL 97 98 98 96  CO2 28 29 28 28   GLUCOSE 273* 244* 225* 230*  BUN 33* 17 18 21   CREATININE 2.11* 1.49* 1.39* 1.33  CALCIUM 9.1 9.0 9.2 9.6  MG -- -- -- --    Lab 01/17/12 1316 01/16/12 1750  INR 1.07 0.99  PROTIME -- --   Cardiac markers:  Lab 01/16/12 1740  CKMB 1.3  TROPONINI <0.30  MYOGLOBIN --   No components found with this basename: POCBNP:3 Recent Results (from the past 240 hour(s))  SURGICAL PCR SCREEN     Status: Abnormal   Collection Time   01/17/12  1:07 PM      Component Value  Range Status Comment   MRSA, PCR NEGATIVE  NEGATIVE  Final    Staphylococcus aureus POSITIVE (*) NEGATIVE  Final     Studies/Results: Dg Cholangiogram Operative  01/18/2012  *RADIOLOGY REPORT*  Clinical Data:   Cholelithiasis  INTRAOPERATIVE CHOLANGIOGRAM  Technique:  Cholangiographic images from the C-arm fluoroscopic device were submitted for interpretation post-operatively.  Please see the procedural report for the amount of contrast and the fluoroscopy time utilized.  Comparison:  None  Findings:  No persistent filling defects in the common duct. Intrahepatic ducts are incompletely visualized, appearing decompressed centrally. There is no contrast passage beyond the ampulla into the duodenum.  IMPRESSION  1.  Distal CBD obstruction near the ampulla suggesting retained calculus.  Original Report Authenticated By: Osa Craver, M.D.   Dg Chest Port 1 View  01/18/2012  *RADIOLOGY REPORT*  Clinical Data: Postop, ? Aspiration  PORTABLE  CHEST - 1 VIEW  Comparison: 01/15/2011  Findings: Patchy bilateral lower lobe and right mid lung opacities, new.  Cardiomediastinal silhouette is unchanged.  IMPRESSION: Patchy bilateral lower lobe and right mid lung opacities, new, possibly reflecting aspiration.  Original Report Authenticated By: Charline Bills, M.D.    Medications: Scheduled Meds:   . amLODipine  2.5 mg Oral Q breakfast  . aspirin EC  81 mg Oral BID  . calcitRIOL  0.25 mcg Oral Q breakfast  . carvedilol  25 mg Oral BID WC  . furosemide  60 mg Oral BID  . gabapentin  300 mg Oral BID  . hydroxychloroquine  200 mg Oral BID  . insulin aspart  0-15 Units Subcutaneous Q4H  . insulin NPH  30 Units Subcutaneous BID WC  . pantoprazole (PROTONIX) IV  40 mg Intravenous Q12H  . piperacillin-tazobactam (ZOSYN)  IV  3.375 g Intravenous Q8H  . vancomycin  2,000 mg Intravenous Once  . DISCONTD: ampicillin-sulbactam (UNASYN) IV  3 g Intravenous Q6H  . DISCONTD: piperacillin-tazobactam (ZOSYN)  IV  2.25 g Intravenous Q12H  . DISCONTD: vancomycin  1,500 mg Intravenous Q12H  . DISCONTD: vancomycin  1,000 mg Intravenous Q12H   Continuous Infusions:   . 0.9 % NaCl with KCl 20 mEq / L 75 mL/hr at 01/18/12 1642  . lactated ringers 100 mL/hr at 01/18/12 1245   PRN Meds:.albuterol-ipratropium, colchicine, HYDROcodone-acetaminophen, HYDROmorphone (DILAUDID) injection, ondansetron, promethazine, DISCONTD:  morphine injection  Assessment/Plan:  Active Problems:   Cholelithiases  - s/p lap chole, pt clinically stable - surgery following  - appreciate involvement in management of pt's complex medical problems   CHF (congestive heart failure)  - clinically compensated  - maintaining saturations > 92% on 2 L [place]   Diabetes mellitus  - increase the insulin to achieve better glucose control  Acute on Chronic Renal Failure - now likely secondary to prerenal etiology secondary to lap chole - will hold of on lasix for today and provide  gentle hydration - may resume lasix once adequately hydrated - BMP in AM  ? Aspiration PNA - as noted per CXR - continue broad spectrum abx for now and narrow the coverage if pt remains clinically stable with no fever and no leukocytosis  EDUCATION  - test results and diagnostic studies were discussed with patient  - patient verbalized the understanding  - questions were answered at the bedside and contact information was provided for additional questions or concerns    LOS: 3 days   MAGICK-Khizar Fiorella 01/19/2012, 6:08 PM  [facility] Pager: [phone]

## 2012-01-19 NOTE — Progress Notes (Signed)
Patient ID: Mitchell Parker, male   DOB: [birth year], 61 y.o.   MRN: [id number] 1 Day Post-Op  Subjective: Pt c/o abdominal pain today.  + flatus.  Tolerated clears last pm.  Objective: Vital signs in last 24 hours: Temp:  [98.2 F (36.8 C)-100.1 F (37.8 C)] 98.2 F (36.8 C) (01/24 0630) Pulse Rate:  [82-95] 87  (01/24 0630) Resp:  [14-23] 18  (01/24 0630) BP: (115-149)/(56-76) 149/76 mmHg (01/24 0630) SpO2:  [93 %-100 %] 95 % (01/24 0630) Last BM Date: 01/15/12  Intake/Output from previous day: 01/23 0701 - 01/24 0700 In: 4047.5 [P.O.:1200; I.V.:2297.5; IV Piggyback:550] Out: 785 [Urine:325; Drains:160; Blood:300] Intake/Output this shift:    PE: Abd: soft, obese, +BS, tender throughout, but greatest in RUQ, which is expected, JP with serousang output Heart: regular Lungs: CTAB  Lab Results:   Basename 01/19/12 0514 01/17/12 1316  WBC 13.6* 13.6*  HGB 12.6* 12.7*  HCT 37.4* 37.3*  PLT 169 144*   BMET  Basename 01/19/12 0514 01/17/12 1316  NA 135 136  K 4.5 3.7  CL 97 98  CO2 28 29  GLUCOSE 273* 244*  BUN 33* 17  CREATININE 2.11* 1.49*  CALCIUM 9.1 9.0   PT/INR  Basename 01/17/12 1316 01/16/12 1750  LABPROT 14.1 13.3  INR 1.07 0.99     Studies/Results: Dg Cholangiogram Operative  01/18/2012  *RADIOLOGY REPORT*  Clinical Data:   Cholelithiasis  INTRAOPERATIVE CHOLANGIOGRAM  Technique:  Cholangiographic images from the C-arm fluoroscopic device were submitted for interpretation post-operatively.  Please see the procedural report for the amount of contrast and the fluoroscopy time utilized.  Comparison:  None  Findings:  No persistent filling defects in the common duct. Intrahepatic ducts are incompletely visualized, appearing decompressed centrally. There is no contrast passage beyond the ampulla into the duodenum.  IMPRESSION  1.  Distal CBD obstruction near the ampulla suggesting retained calculus.  Original Report Authenticated By: Osa Craver, M.D.     Dg Chest Port 1 View  01/18/2012  *RADIOLOGY REPORT*  Clinical Data: Postop, ? Aspiration  PORTABLE CHEST - 1 VIEW  Comparison: 01/15/2011  Findings: Patchy bilateral lower lobe and right mid lung opacities, new.  Cardiomediastinal silhouette is unchanged.  IMPRESSION: Patchy bilateral lower lobe and right mid lung opacities, new, possibly reflecting aspiration.  Original Report Authenticated By: Charline Bills, M.D.    Anti-infectives: Anti-infectives     Start     Dose/Rate Route Frequency Ordered Stop   01/19/12 1000   vancomycin (VANCOCIN) 1,500 mg in sodium chloride 0.9 % 500 mL IVPB        1,500 mg 250 mL/hr over 120 Minutes Intravenous Every 12 hours 01/18/12 2204     01/18/12 2300  piperacillin-tazobactam (ZOSYN) IVPB 3.375 g       3.375 g 12.5 mL/hr over 240 Minutes Intravenous 3 times per day 01/18/12 2154     01/18/12 2300   vancomycin (VANCOCIN) 2,000 mg in sodium chloride 0.9 % 500 mL IVPB        2,000 mg 250 mL/hr over 120 Minutes Intravenous  Once 01/18/12 2204 01/19/12 0046   01/18/12 2200   piperacillin-tazobactam (ZOSYN) IVPB 2.25 g  Status:  Discontinued        2.25 g 100 mL/hr over 30 Minutes Intravenous Every 12 hours 01/18/12 2122 01/18/12 2139   01/18/12 2130   vancomycin (VANCOCIN) IVPB 1000 mg/200 mL premix  Status:  Discontinued        1,000 mg 200 mL/hr over  60 Minutes Intravenous Every 12 hours 01/18/12 2122 01/18/12 2139   01/17/12 1200   ceFAZolin (ANCEF) IVPB 2 g/50 mL premix        2 g 100 mL/hr over 30 Minutes Intravenous 60 min pre-op 01/17/12 1156 01/18/12 0900   01/17/12 1000   Ampicillin-Sulbactam (UNASYN) 3 g in sodium chloride 0.9 % 100 mL IVPB  Status:  Discontinued        3 g 100 mL/hr over 60 Minutes Intravenous Every 6 hours 01/17/12 0839 01/18/12 2122   01/16/12 2300   hydroxychloroquine (PLAQUENIL) tablet 200 mg        200 mg Oral 2 times daily 01/16/12 2220     01/16/12 1900   ertapenem (INVANZ) 1 g in sodium chloride 0.9 % 50  mL IVPB  Status:  Discontinued        1 g 100 mL/hr over 30 Minutes Intravenous Every 24 hours 01/16/12 1858 01/17/12 0741           Assessment/Plan  1. Acute on chronic cholecystitis, s/p lap chole 2. Possible choledocholithiasis 3. OSA 4. CHF 5.COPD 6. Morbidly obese  Plan: 1. Surgically patient appears ok.  He is having quite a bit of pain, which I would expect given the difficulty of the case. 2. Will defer ERCP vs MRCP to GI 3. Cont NPO for today until seen by GI, incase any procedures are planned. 4. Cont unasyn 5. oob and mobilize today 6. If no procedures planned, his diet has can be advanced to low fat regular diet. 7. Will follow.   LOS: 3 days    Jaykob Minichiello E 01/19/2012

## 2012-01-19 NOTE — Progress Notes (Signed)
ANTIBIOTIC CONSULT NOTE - follow-up  Pharmacy Consult for Vancomycin & Zosyn Indication: HAP vs aspiration PNA  Allergies  Allergen Reactions  . Ciprofloxacin Hives  . Levofloxacin (Levaquin) Hives    Patient Measurements: Height: 6\' 1"  (185.4 cm) Weight: 345 lb 14.4 oz (156.9 kg) IBW/kg (Calculated) : 79.9    Vital Signs: Temp: 98.5 F (36.9 C) (01/24 1011) Temp src: Oral (01/24 1011) BP: 151/75 mmHg (01/24 1011) Pulse Rate: 76  (01/24 1011) Intake/Output from previous day: 01/23 0701 - 01/24 0700 In: 4047.5 [P.O.:1200; I.V.:2297.5; IV Piggyback:550] Out: 785 [Urine:325; Drains:160; Blood:300] Intake/Output from this shift:    Labs:  Basename 01/19/12 0514 01/17/12 1316 01/17/12 0452  WBC 13.6* 13.6* 11.4*  HGB 12.6* 12.7* 12.7*  PLT 169 144* 162  LABCREA -- -- --  CREATININE 2.11* 1.49* 1.39*   Estimated Creatinine Clearance: 59.8 ml/min (by C-G formula based on Cr of 2.11). No results found for this basename: VANCOTROUGH:2,VANCOPEAK:2,VANCORANDOM:2,GENTTROUGH:2,GENTPEAK:2,GENTRANDOM:2,TOBRATROUGH:2,TOBRAPEAK:2,TOBRARND:2,AMIKACINPEAK:2,AMIKACINTROU:2,AMIKACIN:2, in the last 72 hours   Microbiology: Recent Results (from the past 720 hour(s))  SURGICAL PCR SCREEN     Status: Abnormal   Collection Time   01/17/12  1:07 PM      Component Value Range Status Comment   MRSA, PCR NEGATIVE  NEGATIVE  Final    Staphylococcus aureus POSITIVE (*) NEGATIVE  Final     Medical History: Past Medical History  Diagnosis Date  . CHF (congestive heart failure)   . Diabetes mellitus   . Neuropathy   . Hypertension   . COPD (chronic obstructive pulmonary disease)   . Gallstones   . RA (rheumatoid arthritis)   . Obese   . Kidney stones   . Piriformis syndrome     left hip  . Lumbar disc disease     compression   . Sleep apnea   . Gout     Medications:  Anti-infectives     Start     Dose/Rate Route Frequency Ordered Stop   01/19/12 1000   vancomycin (VANCOCIN)  1,500 mg in sodium chloride 0.9 % 500 mL IVPB        1,500 mg 250 mL/hr over 120 Minutes Intravenous Every 12 hours 01/18/12 2204     01/18/12 2300   piperacillin-tazobactam (ZOSYN) IVPB 3.375 g        3.375 g 12.5 mL/hr over 240 Minutes Intravenous 3 times per day 01/18/12 2154     01/18/12 2300   vancomycin (VANCOCIN) 2,000 mg in sodium chloride 0.9 % 500 mL IVPB        2,000 mg 250 mL/hr over 120 Minutes Intravenous  Once 01/18/12 2204 01/19/12 0046   01/18/12 2200   piperacillin-tazobactam (ZOSYN) IVPB 2.25 g  Status:  Discontinued        2.25 g 100 mL/hr over 30 Minutes Intravenous Every 12 hours 01/18/12 2122 01/18/12 2139   01/18/12 2130   vancomycin (VANCOCIN) IVPB 1000 mg/200 mL premix  Status:  Discontinued        1,000 mg 200 mL/hr over 60 Minutes Intravenous Every 12 hours 01/18/12 2122 01/18/12 2139   01/17/12 1200   ceFAZolin (ANCEF) IVPB 2 g/50 mL premix        2 g 100 mL/hr over 30 Minutes Intravenous 60 min pre-op 01/17/12 1156 01/18/12 0900   01/17/12 1000   Ampicillin-Sulbactam (UNASYN) 3 g in sodium chloride 0.9 % 100 mL IVPB  Status:  Discontinued        3 g 100 mL/hr over 60 Minutes Intravenous Every 6  hours 01/17/12 0839 01/18/12 2122   01/16/12 2300   hydroxychloroquine (PLAQUENIL) tablet 200 mg        200 mg Oral 2 times daily 01/16/12 2220     01/16/12 1900   ertapenem (INVANZ) 1 g in sodium chloride 0.9 % 50 mL IVPB  Status:  Discontinued        1 g 100 mL/hr over 30 Minutes Intravenous Every 24 hours 01/16/12 1858 01/17/12 0741         Assessment: Day #2 Vanc/Zosyn for HAP vs aspiration PNA Significant jump in SCr 1.49 to 2.11 - Normalized CrCl on 39 ml/min/1.61m2 (was about 60) WBC unchanged Afebrile  Goal of Therapy:  Vancomycin trough level 15-20 mcg/ml  Plan:  1. Continue current Zosyn dosing 2. Due to large jump in SCr and patient has received 2 doses of vancomycin - 2g x 1 and 1500mg  x1 - will d/c vancomycin for now and check a  trough level prior to when next dose would have been tonight before deciding on subsequent doses.   Hessie Knows, PharmD, BCPS 01/19/2012 11:02 AM [phone]

## 2012-01-19 NOTE — Progress Notes (Signed)
Awaiting GI input.  Looks good.  No pulmonary issues.

## 2012-01-19 NOTE — Progress Notes (Signed)
PATIENT STATES HE CANNOT BREATH THROUGH HIS NOSE AND HE DOES NOT WANT TO USE HIS CPAP MACHINE FOR THAT REASON

## 2012-01-20 ENCOUNTER — Encounter: Payer: Self-pay | Admitting: *Deleted

## 2012-01-20 LAB — CBC
HCT: 37.1 % — ABNORMAL LOW (ref 39.0–52.0)
Hemoglobin: 12.5 g/dL — ABNORMAL LOW (ref 13.0–17.0)
MCH: 28.8 pg (ref 26.0–34.0)
MCHC: 33.7 g/dL (ref 30.0–36.0)
MCV: 85.5 fL (ref 78.0–100.0)
Platelets: 166 10*3/uL (ref 150–400)
RBC: 4.34 MIL/uL (ref 4.22–5.81)
RDW: 14.8 % (ref 11.5–15.5)
WBC: 11.2 10*3/uL — ABNORMAL HIGH (ref 4.0–10.5)

## 2012-01-20 LAB — COMPREHENSIVE METABOLIC PANEL
ALT: 28 U/L (ref 0–53)
AST: 44 U/L — ABNORMAL HIGH (ref 0–37)
Albumin: 2.4 g/dL — ABNORMAL LOW (ref 3.5–5.2)
Alkaline Phosphatase: 73 U/L (ref 39–117)
BUN: 44 mg/dL — ABNORMAL HIGH (ref 6–23)
CO2: 22 mEq/L (ref 19–32)
Calcium: 8.6 mg/dL (ref 8.4–10.5)
Chloride: 96 mEq/L (ref 96–112)
Creatinine, Ser: 1.91 mg/dL — ABNORMAL HIGH (ref 0.50–1.35)
GFR calc Af Amer: 43 mL/min — ABNORMAL LOW (ref >90)
GFR calc non Af Amer: 37 mL/min — ABNORMAL LOW (ref >90)
Glucose, Bld: 264 mg/dL — ABNORMAL HIGH (ref 70–99)
Potassium: 4.7 mEq/L (ref 3.5–5.1)
Sodium: 134 mEq/L — ABNORMAL LOW (ref 135–145)
Total Bilirubin: 0.3 mg/dL (ref 0.3–1.2)
Total Protein: 6.6 g/dL (ref 6.0–8.3)

## 2012-01-20 LAB — GLUCOSE, CAPILLARY
Glucose-Capillary: 201 mg/dL — ABNORMAL HIGH (ref 70–99)
Glucose-Capillary: 214 mg/dL — ABNORMAL HIGH (ref 70–99)
Glucose-Capillary: 233 mg/dL — ABNORMAL HIGH (ref 70–99)
Glucose-Capillary: 247 mg/dL — ABNORMAL HIGH (ref 70–99)
Glucose-Capillary: 259 mg/dL — ABNORMAL HIGH (ref 70–99)
Glucose-Capillary: 279 mg/dL — ABNORMAL HIGH (ref 70–99)

## 2012-01-20 MED ORDER — AZITHROMYCIN 250 MG PO TABS
250.0000 mg | ORAL_TABLET | Freq: Every day | ORAL | Status: DC
  Administered 2012-01-20 – 2012-01-21 (×2): 250 mg via ORAL
  Filled 2012-01-20 (×3): qty 1

## 2012-01-20 MED ORDER — VANCOMYCIN HCL 1000 MG IV SOLR
1750.0000 mg | INTRAVENOUS | Status: DC
  Administered 2012-01-20: 1750 mg via INTRAVENOUS
  Filled 2012-01-20 (×2): qty 1750

## 2012-01-20 MED ORDER — OXYCODONE-ACETAMINOPHEN 5-325 MG PO TABS
1.0000 | ORAL_TABLET | ORAL | Status: DC | PRN
  Administered 2012-01-20 – 2012-01-21 (×4): 2 via ORAL
  Filled 2012-01-20 (×4): qty 2

## 2012-01-20 MED ORDER — LIP MEDEX EX OINT
TOPICAL_OINTMENT | CUTANEOUS | Status: AC
  Administered 2012-01-20: 20:00:00
  Filled 2012-01-20: qty 7

## 2012-01-20 MED ORDER — FUROSEMIDE 40 MG PO TABS
40.0000 mg | ORAL_TABLET | Freq: Two times a day (BID) | ORAL | Status: DC
  Administered 2012-01-20 – 2012-01-21 (×2): 40 mg via ORAL
  Filled 2012-01-20 (×5): qty 1

## 2012-01-20 MED ORDER — POLYETHYLENE GLYCOL 3350 17 G PO PACK
17.0000 g | PACK | Freq: Every day | ORAL | Status: DC
  Administered 2012-01-20 – 2012-01-21 (×2): 17 g via ORAL
  Filled 2012-01-20 (×3): qty 1

## 2012-01-20 MED ORDER — PANTOPRAZOLE SODIUM 40 MG PO TBEC
40.0000 mg | DELAYED_RELEASE_TABLET | Freq: Two times a day (BID) | ORAL | Status: DC
  Administered 2012-01-20 – 2012-01-21 (×3): 40 mg via ORAL
  Filled 2012-01-20 (×5): qty 1

## 2012-01-20 NOTE — Progress Notes (Signed)
Surgically stable.  GI to sort out COMMON DUCT STONE.  lOOKS GOOD.

## 2012-01-20 NOTE — Progress Notes (Signed)
ANTIBIOTIC CONSULT NOTE - FOLLOW UP  Pharmacy Consult for Vancomycin Indication: pneumonia  Allergies  Allergen Reactions  . Ciprofloxacin Hives  . Levofloxacin (Levaquin) Hives    Patient Measurements: Height: 6\' 1"  (185.4 cm) Weight: 345 lb 14.4 oz (156.9 kg) IBW/kg (Calculated) : 79.9  Adjusted Body Weight:   Vital Signs: Temp: 98.3 F (36.8 C) (01/25 0215) Temp src: Oral (01/25 0215) BP: 124/58 mmHg (01/25 0215) Pulse Rate: 72  (01/25 0215) Intake/Output from previous day: 01/24 0701 - 01/25 0700 In: 400 [P.O.:400] Out: 1460 [Urine:1350; Drains:110] Intake/Output from this shift: Total I/O In: -  Out: 820 [Urine:750; Drains:70]  Labs:  [facility] 01/19/12 0514 01/17/12 1316 01/17/12 0452  WBC 13.6* 13.6* 11.4*  HGB 12.6* 12.7* 12.7*  PLT 169 144* 162  LABCREA -- -- --  CREATININE 2.11* 1.49* 1.39*   Estimated Creatinine Clearance: 59.8 ml/min (by C-G formula based on Cr of 2.11).  Basename 01/19/12 2155  VANCOTROUGH 22.6*  VANCOPEAK --  Drue Dun --  GENTTROUGH --  GENTPEAK --  GENTRANDOM --  TOBRATROUGH --  TOBRAPEAK --  TOBRARND --  AMIKACINPEAK --  AMIKACINTROU --  AMIKACIN --     Microbiology: Recent Results (from the past 720 hour(s))  SURGICAL PCR SCREEN     Status: Abnormal   Collection Time   01/17/12  1:07 PM      Component Value Range Status Comment   MRSA, PCR NEGATIVE  NEGATIVE  Final    Staphylococcus aureus POSITIVE (*) NEGATIVE  Final     Anti-infectives     Start     Dose/Rate Route Frequency Ordered Stop   01/20/12 0600   vancomycin (VANCOCIN) 1,750 mg in sodium chloride 0.9 % 500 mL IVPB        1,750 mg 250 mL/hr over 120 Minutes Intravenous Every 24 hours 01/20/12 0428     01/19/12 1000   vancomycin (VANCOCIN) 1,500 mg in sodium chloride 0.9 % 500 mL IVPB  Status:  Discontinued        1,500 mg 250 mL/hr over 120 Minutes Intravenous Every 12 hours 01/18/12 2204 01/19/12 1106   01/18/12 2300  piperacillin-tazobactam  (ZOSYN) IVPB 3.375 g       3.375 g 12.5 mL/hr over 240 Minutes Intravenous 3 times per day 01/18/12 2154     01/18/12 2300   vancomycin (VANCOCIN) 2,000 mg in sodium chloride 0.9 % 500 mL IVPB        2,000 mg 250 mL/hr over 120 Minutes Intravenous  Once 01/18/12 2204 01/19/12 0046   01/18/12 2200   piperacillin-tazobactam (ZOSYN) IVPB 2.25 g  Status:  Discontinued        2.25 g 100 mL/hr over 30 Minutes Intravenous Every 12 hours 01/18/12 2122 01/18/12 2139   01/18/12 2130   vancomycin (VANCOCIN) IVPB 1000 mg/200 mL premix  Status:  Discontinued        1,000 mg 200 mL/hr over 60 Minutes Intravenous Every 12 hours 01/18/12 2122 01/18/12 2139   01/17/12 1200   ceFAZolin (ANCEF) IVPB 2 g/50 mL premix        2 g 100 mL/hr over 30 Minutes Intravenous 60 min pre-op 01/17/12 1156 01/18/12 0900   01/17/12 1000   Ampicillin-Sulbactam (UNASYN) 3 g in sodium chloride 0.9 % 100 mL IVPB  Status:  Discontinued        3 g 100 mL/hr over 60 Minutes Intravenous Every 6 hours 01/17/12 0839 01/18/12 2122   01/16/12 2300   hydroxychloroquine (PLAQUENIL) tablet 200 mg  200 mg Oral 2 times daily 01/16/12 2220     01/16/12 1900   ertapenem (INVANZ) 1 g in sodium chloride 0.9 % 50 mL IVPB  Status:  Discontinued        1 g 100 mL/hr over 30 Minutes Intravenous Every 24 hours 01/16/12 1858 01/17/12 0741          Assessment: Patient with high level even after two doses.  Also increase in SCr.    Goal of Therapy:  Vancomycin trough level 15-20 mcg/ml  Plan:  Will change to 1750mg  iv q24h  Aleene Davidson Crowford 01/20/2012,4:28 AM

## 2012-01-20 NOTE — Progress Notes (Signed)
Patient ID: Mitchell Parker, male   DOB: 04/16/1951, 61 y.o.   MRN: [id number] 2 Days Post-Op  Subjective: Pt feels the best he's "felt in years"  Minimal pain.    Objective: Vital signs in last 24 hours: Temp:  [97.8 F (36.6 C)-98.7 F (37.1 C)] 98.7 F (37.1 C) (01/25 0515) Pulse Rate:  [70-76] 70  (01/25 0515) Resp:  [16-20] 20  (01/25 0515) BP: (124-154)/(58-81) 145/63 mmHg (01/25 0515) SpO2:  [95 %-100 %] 100 % (01/25 0515) Last BM Date: 01/15/12  Intake/Output from previous day: 01/24 0701 - 01/25 0700 In: 400 [P.O.:400] Out: 2440 [Urine:2300; Drains:140] Intake/Output this shift:    PE: Abd: soft, minimally tender, +BS, obese, JP with serosang output.  Lab Results:   [facility] 01/20/12 0527 01/19/12 0514  WBC 11.2* 13.6*  HGB 12.5* 12.6*  HCT 37.1* 37.4*  PLT 166 169   BMET  Basename 01/20/12 0527 01/19/12 0514  NA 134* 135  K 4.7 4.5  CL 96 97  CO2 22 28  GLUCOSE 264* 273*  BUN 44* 33*  CREATININE 1.91* 2.11*  CALCIUM 8.6 9.1   PT/INR  Basename 01/17/12 1316  LABPROT 14.1  INR 1.07     Studies/Results: Dg Cholangiogram Operative  01/18/2012  *RADIOLOGY REPORT*  Clinical Data:   Cholelithiasis  INTRAOPERATIVE CHOLANGIOGRAM  Technique:  Cholangiographic images from the C-arm fluoroscopic device were submitted for interpretation post-operatively.  Please see the procedural report for the amount of contrast and the fluoroscopy time utilized.  Comparison:  None  Findings:  No persistent filling defects in the common duct. Intrahepatic ducts are incompletely visualized, appearing decompressed centrally. There is no contrast passage beyond the ampulla into the duodenum.  IMPRESSION  1.  Distal CBD obstruction near the ampulla suggesting retained calculus.  Original Report Authenticated By: Osa Craver, M.D.   Dg Chest Port 1 View  01/18/2012  *RADIOLOGY REPORT*  Clinical Data: Postop, ? Aspiration  PORTABLE CHEST - 1 VIEW  Comparison: 01/15/2011   Findings: Patchy bilateral lower lobe and right mid lung opacities, new.  Cardiomediastinal silhouette is unchanged.  IMPRESSION: Patchy bilateral lower lobe and right mid lung opacities, new, possibly reflecting aspiration.  Original Report Authenticated By: Charline Bills, M.D.    Anti-infectives: Anti-infectives     Start     Dose/Rate Route Frequency Ordered Stop   01/20/12 0600   vancomycin (VANCOCIN) 1,750 mg in sodium chloride 0.9 % 500 mL IVPB        1,750 mg 250 mL/hr over 120 Minutes Intravenous Every 24 hours 01/20/12 0428     01/19/12 1000   vancomycin (VANCOCIN) 1,500 mg in sodium chloride 0.9 % 500 mL IVPB  Status:  Discontinued        1,500 mg 250 mL/hr over 120 Minutes Intravenous Every 12 hours 01/18/12 2204 01/19/12 1106   01/18/12 2300  piperacillin-tazobactam (ZOSYN) IVPB 3.375 g       3.375 g 12.5 mL/hr over 240 Minutes Intravenous 3 times per day 01/18/12 2154     01/18/12 2300   vancomycin (VANCOCIN) 2,000 mg in sodium chloride 0.9 % 500 mL IVPB        2,000 mg 250 mL/hr over 120 Minutes Intravenous  Once 01/18/12 2204 01/19/12 0046   01/18/12 2200   piperacillin-tazobactam (ZOSYN) IVPB 2.25 g  Status:  Discontinued        2.25 g 100 mL/hr over 30 Minutes Intravenous Every 12 hours 01/18/12 2122 01/18/12 2139   01/18/12 2130  vancomycin (VANCOCIN) IVPB 1000 mg/200 mL premix  Status:  Discontinued        1,000 mg 200 mL/hr over 60 Minutes Intravenous Every 12 hours 01/18/12 2122 01/18/12 2139   01/17/12 1200   ceFAZolin (ANCEF) IVPB 2 g/50 mL premix        2 g 100 mL/hr over 30 Minutes Intravenous 60 min pre-op 01/17/12 1156 01/18/12 0900   01/17/12 1000   Ampicillin-Sulbactam (UNASYN) 3 g in sodium chloride 0.9 % 100 mL IVPB  Status:  Discontinued        3 g 100 mL/hr over 60 Minutes Intravenous Every 6 hours 01/17/12 0839 01/18/12 2122   01/16/12 2300   hydroxychloroquine (PLAQUENIL) tablet 200 mg        200 mg Oral 2 times daily 01/16/12 2220      01/16/12 1900   ertapenem (INVANZ) 1 g in sodium chloride 0.9 % 50 mL IVPB  Status:  Discontinued        1 g 100 mL/hr over 30 Minutes Intravenous Every 24 hours 01/16/12 1858 01/17/12 0741           Assessment/Plan  1. S/p lap chole 2. Probable choledocholithiasis  Plan: 1. Hgb stable, may resume blood thinners, ASA 2. ERCP per GI, IOC shows distal CBD obstruction likely secondary to retained stone.  LFTs are normal.  Further plan per GI 3. Patient is surgically stable, will remove JP drain prior to d/c home.  If pt goes home today please let us know so we can give an order for d/c of JP drain.   LOS: 4 days    Tyrease Vandeberg E 01/20/2012

## 2012-01-20 NOTE — Progress Notes (Signed)
Patient ID: Mitchell Parker, male   DOB: 12-16-1951, 61 y.o.   MRN: [id number]  Subjective: No events overnight. Patient denies chest pain, shortness of breath, abdominal pain.  Objective:  Vital signs in last 24 hours:  Filed Vitals:   01/19/12 2051 01/20/12 0215 01/20/12 0515 01/20/12 1012  BP: 138/77 124/58 145/63 152/65  Pulse: 71 72 70 69  Temp: 98.3 F (36.8 C) 98.3 F (36.8 C) 98.7 F (37.1 C) 98.2 F (36.8 C)  TempSrc: Oral Oral Oral Oral  Resp: 16 20 20 18   Height:      Weight:      SpO2: 97% 100% 100% 96%    Intake/Output from previous day:   Intake/Output Summary (Last 24 hours) at 01/20/12 1155 Last data filed at 01/20/12 0900  Gross per 24 hour  Intake    640 ml  Output   3140 ml  Net  -2500 ml    Physical Exam: General: Alert, awake, oriented x3, in no acute distress. HEENT: No bruits, no goiter. Moist mucous membranes, no scleral icterus, no conjunctival pallor. Heart: Regular rate and rhythm, S1/S2 +, no murmurs, rubs, gallops. Lungs: Clear to auscultation bilaterally. No wheezing, no rhonchi, no rales.  Abdomen: Soft, nontender, nondistended, positive bowel sounds. Extremities: No clubbing or cyanosis, no pitting edema,  positive pedal pulses. Neuro: Grossly nonfocal.  Lab Results:  Basic Metabolic Panel:    Component Value Date/Time   NA 134* 01/20/2012 0527   K 4.7 01/20/2012 0527   CL 96 01/20/2012 0527   CO2 22 01/20/2012 0527   BUN 44* 01/20/2012 0527   CREATININE 1.91* 01/20/2012 0527   GLUCOSE 264* 01/20/2012 0527   CALCIUM 8.6 01/20/2012 0527   CBC:    Component Value Date/Time   WBC 11.2* 01/20/2012 0527   HGB 12.5* 01/20/2012 0527   HCT 37.1* 01/20/2012 0527   PLT 166 01/20/2012 0527   MCV 85.5 01/20/2012 0527   NEUTROABS 13.3* 10/26/2011 0530   LYMPHSABS 0.8 10/26/2011 0530   MONOABS 1.3* 10/26/2011 0530   EOSABS 0.1 10/26/2011 0530   BASOSABS 0.0 10/26/2011 0530      Lab 01/20/12 0527 01/19/12 0514 01/17/12 1316 01/17/12 0452  01/16/12 1350  WBC 11.2* 13.6* 13.6* 11.4* 11.0*  HGB 12.5* 12.6* 12.7* 12.7* 14.0  HCT 37.1* 37.4* 37.3* 38.0* 41.5  PLT 166 169 144* 162 177  MCV 85.5 87.0 87.8 87.6 86.5  MCH 28.8 29.3 29.9 29.3 29.2  MCHC 33.7 33.7 34.0 33.4 33.7  RDW 14.8 14.8 15.4 15.3 15.0  LYMPHSABS -- -- -- -- --  MONOABS -- -- -- -- --  EOSABS -- -- -- -- --  BASOSABS -- -- -- -- --  BANDABS -- -- -- -- --    Lab 01/20/12 0527 01/19/12 0514 01/17/12 1316 01/17/12 0452 01/16/12 1350  NA 134* 135 136 137 137  K 4.7 4.5 3.7 3.6 3.7  CL 96 97 98 98 96  CO2 22 28 29 28 28   GLUCOSE 264* 273* 244* 225* 230*  BUN 44* 33* 17 18 21   CREATININE 1.91* 2.11* 1.49* 1.39* 1.33  CALCIUM 8.6 9.1 9.0 9.2 9.6  MG -- -- -- -- --    Lab 01/17/12 1316 01/16/12 1750  INR 1.07 0.99  PROTIME -- --   Cardiac markers:  Lab 01/16/12 1740  CKMB 1.3  TROPONINI <0.30  MYOGLOBIN --   No components found with this basename: POCBNP:3 Recent Results (from the past 240 hour(s))  SURGICAL PCR SCREEN  Status: Abnormal   Collection Time   01/17/12  1:07 PM      Component Value Range Status Comment   MRSA, PCR NEGATIVE  NEGATIVE  Final    Staphylococcus aureus POSITIVE (*) NEGATIVE  Final     Studies/Results: Dg Chest Port 1 View  01/18/2012  *RADIOLOGY REPORT*  Clinical Data: Postop, ? Aspiration  PORTABLE CHEST - 1 VIEW  Comparison: 01/15/2011  Findings: Patchy bilateral lower lobe and right mid lung opacities, new.  Cardiomediastinal silhouette is unchanged.  IMPRESSION: Patchy bilateral lower lobe and right mid lung opacities, new, possibly reflecting aspiration.  Original Report Authenticated By: Charline Bills, M.D.    Medications: Scheduled Meds:   . amLODipine  2.5 mg Oral Q breakfast  . aspirin EC  81 mg Oral BID  . calcitRIOL  0.25 mcg Oral Q breakfast  . carvedilol  25 mg Oral BID WC  . furosemide  60 mg Oral BID  . gabapentin  300 mg Oral BID  . hydroxychloroquine  200 mg Oral BID  . insulin aspart   0-15 Units Subcutaneous Q4H  . insulin NPH  35 Units Subcutaneous BID WC  . pantoprazole  40 mg Oral BID AC  . piperacillin-tazobactam (ZOSYN)  IV  3.375 g Intravenous Q8H  . vancomycin  1,750 mg Intravenous Q24H  . DISCONTD: insulin NPH  30 Units Subcutaneous BID WC  . DISCONTD: pantoprazole (PROTONIX) IV  40 mg Intravenous Q12H   Continuous Infusions:   . 0.9 % NaCl with KCl 20 mEq / L 75 mL/hr at 01/20/12 0422  . lactated ringers 100 mL/hr at 01/18/12 1245   PRN Meds:.albuterol-ipratropium, colchicine, HYDROmorphone (DILAUDID) injection, ondansetron, oxyCODONE-acetaminophen, promethazine, DISCONTD: HYDROcodone-acetaminophen  Assessment/Plan:  Active Problems:  Cholelithiases - s/p lap chole - clinically improving - surgery and GI following - appreciate involvement in pt's care   CHF (congestive heart failure) - d/c IVF and resume home dose lasix - BMP in AM   Diabetes mellitus - controlled well, monitor CBG per floor protocol   EDUCATION - test results and diagnostic studies were discussed with patient  - anticipate d/c in AM - patient verbalized the understanding - questions were answered at the bedside and contact information was provided for additional questions or concerns   LOS: 4 days   Mitchell Parker 01/20/2012, 11:55 AM  [facility] Pager: [phone]

## 2012-01-20 NOTE — Progress Notes (Signed)
The patient is receiving Protonix by the intravenous route.  Based on criteria approved by the Pharmacy and Therapeutics Committee and the Medical Executive Committee, the medication is being converted to the equivalent oral dose form.  These criteria include: -No Active GI bleeding -Able to tolerate diet of full liquids (or better) or tube feeding OR able to tolerate other medications by the oral or enteral route   If you have any questions about this conversion, please contact the Pharmacy Department (ext [phone]).  Thank you.  Hessie Knows, PharmD, BCPS 01/20/2012 9:06 AM [phone]

## 2012-01-20 NOTE — Progress Notes (Signed)
[facility] Progress Note  Subjective: No abdominal pain. He feels good.  Objective: Vital signs in last 24 hours: Temp:  [97.8 F (36.6 C)-98.7 F (37.1 C)] 98.2 F (36.8 C) (01/25 1012) Pulse Rate:  [69-76] 69  (01/25 1012) Resp:  [16-20] 18  (01/25 1012) BP: (124-154)/(58-81) 152/65 mmHg (01/25 1012) SpO2:  [96 %-100 %] 96 % (01/25 1012) Weight change:    PE:No distress. Abdomen soft and non tender  Lab Results: Results for orders placed during the hospital encounter of 01/16/12 (from the past 24 hour(s))  GLUCOSE, CAPILLARY     Status: Abnormal   Collection Time   01/19/12  4:01 PM      Component Value Range   Glucose-Capillary 273 (*) 70 - 99 (mg/dL)  GLUCOSE, CAPILLARY     Status: Abnormal   Collection Time   01/19/12  8:50 PM      Component Value Range   Glucose-Capillary 254 (*) 70 - 99 (mg/dL)  VANCOMYCIN, TROUGH     Status: Abnormal   Collection Time   01/19/12  9:55 PM      Component Value Range   Vancomycin Tr 22.6 (*) 10.0 - 20.0 (ug/mL)  GLUCOSE, CAPILLARY     Status: Abnormal   Collection Time   01/20/12 12:01 AM      Component Value Range   Glucose-Capillary 259 (*) 70 - 99 (mg/dL)   Comment 1 Notify RN    GLUCOSE, CAPILLARY     Status: Abnormal   Collection Time   01/20/12  4:19 AM      Component Value Range   Glucose-Capillary 247 (*) 70 - 99 (mg/dL)   Comment 1 Notify RN    CBC     Status: Abnormal   Collection Time   01/20/12  5:27 AM      Component Value Range   WBC 11.2 (*) 4.0 - 10.5 (K/uL)   RBC 4.34  4.22 - 5.81 (MIL/uL)   Hemoglobin 12.5 (*) 13.0 - 17.0 (g/dL)   HCT 45.4 (*) 09.8 - 52.0 (%)   MCV 85.5  78.0 - 100.0 (fL)   MCH 28.8  26.0 - 34.0 (pg)   MCHC 33.7  30.0 - 36.0 (g/dL)   RDW 11.9  14.7 - 82.9 (%)   Platelets 166  150 - 400 (K/uL)  COMPREHENSIVE METABOLIC PANEL     Status: Abnormal   Collection Time   01/20/12  5:27 AM      Component Value Range   Sodium 134 (*) 135 - 145 (mEq/L)   Potassium 4.7  3.5 - 5.1  (mEq/L)   Chloride 96  96 - 112 (mEq/L)   CO2 22  19 - 32 (mEq/L)   Glucose, Bld 264 (*) 70 - 99 (mg/dL)   BUN 44 (*) 6 - 23 (mg/dL)   Creatinine, Ser 5.62 (*) 0.50 - 1.35 (mg/dL)   Calcium 8.6  8.4 - 13.0 (mg/dL)   Total Protein 6.6  6.0 - 8.3 (g/dL)   Albumin 2.4 (*) 3.5 - 5.2 (g/dL)   AST 44 (*) 0 - 37 (U/L)   ALT 28  0 - 53 (U/L)   Alkaline Phosphatase 73  39 - 117 (U/L)   Total Bilirubin 0.3  0.3 - 1.2 (mg/dL)   GFR calc non Af Amer 37 (*) >90 (mL/min)   GFR calc Af Amer 43 (*) >90 (mL/min)  GLUCOSE, CAPILLARY     Status: Abnormal   Collection Time   01/20/12  7:34 AM  Component Value Range   Glucose-Capillary 279 (*) 70 - 99 (mg/dL)    Studies/Results: @RISRSLT24 @    Assessment: Doing well.   Plan: I discussed with Dr Madilyn Fireman who saw and evaluated him. In view of no pain and no significant elevation of LFTs it is not felt that he has an obtructing stone in the CBD. From GI standpoint he can be followed up as outpatient with Dr Madilyn Fireman in a few weeks. Further workup with MRCP can be done as an outpatient if felt necessary.   Uma Jerde F 01/20/2012, 11:20 AM

## 2012-01-21 LAB — GLUCOSE, CAPILLARY
Glucose-Capillary: 162 mg/dL — ABNORMAL HIGH (ref 70–99)
Glucose-Capillary: 180 mg/dL — ABNORMAL HIGH (ref 70–99)
Glucose-Capillary: 182 mg/dL — ABNORMAL HIGH (ref 70–99)
Glucose-Capillary: 182 mg/dL — ABNORMAL HIGH (ref 70–99)
Glucose-Capillary: 201 mg/dL — ABNORMAL HIGH (ref 70–99)

## 2012-01-21 MED ORDER — AZITHROMYCIN 250 MG PO TABS: ORAL_TABLET | ORAL | Status: DC

## 2012-01-21 MED ORDER — HYDROCODONE-ACETAMINOPHEN 7.5-325 MG PO TABS: 1.0000 | ORAL_TABLET | Freq: Three times a day (TID) | ORAL | Status: DC | PRN

## 2012-01-21 MED ORDER — PANTOPRAZOLE SODIUM 40 MG PO TBEC: 40.0000 mg | DELAYED_RELEASE_TABLET | Freq: Two times a day (BID) | ORAL | Status: DC

## 2012-01-21 MED ORDER — COLCHICINE 0.6 MG PO TABS: 0.6000 mg | ORAL_TABLET | ORAL | Status: DC | PRN

## 2012-01-21 MED ORDER — AZITHROMYCIN 250 MG PO TABS: ORAL_TABLET | ORAL | Status: AC

## 2012-01-21 MED ORDER — OXYCODONE-ACETAMINOPHEN 5-325 MG PO TABS: 1.0000 | ORAL_TABLET | Freq: Four times a day (QID) | ORAL | Status: AC | PRN

## 2012-01-21 NOTE — Progress Notes (Signed)
At this time, he is taken to his vehicle per w/c without incident.  His wife is driving them home.

## 2012-01-21 NOTE — Discharge Summary (Signed)
Patient ID: ALTON BOUKNIGHT MRN: [id number] DOB/AGE: 1951-07-27 61 y.o.  Admit date: 01/16/2012 Discharge date: 01/21/2012  Primary Care Physician:  Provider Not In System  Discharge Diagnoses:    Present on Admission:  .Cholelithiases .Obesity, morbid .CHF (congestive heart failure) .COPD (chronic obstructive pulmonary disease) .OSA on CPAP .Diabetes mellitus  Active Problems:  Cholelithiases  Obesity, morbid  CHF (congestive heart failure)  COPD (chronic obstructive pulmonary disease)  OSA on CPAP  Diabetes mellitus   Medication List  As of 01/21/2012  8:56 AM   STOP taking these medications         acetaminophen 500 MG tablet         TAKE these medications         albuterol-ipratropium 18-103 MCG/ACT inhaler   Commonly known as: COMBIVENT   Inhale 2 puffs into the lungs every 6 (six) hours as needed. Wheezing      amLODipine 2.5 MG tablet   Commonly known as: NORVASC   Take 2.5 mg by mouth daily with breakfast.      aspirin 81 MG tablet   Take 81 mg by mouth 2 (two) times daily.      azithromycin 250 MG tablet   Commonly known as: ZITHROMAX   Take for 6 days      calcitRIOL 0.25 MCG capsule   Commonly known as: ROCALTROL   Take 0.25 mcg by mouth daily with breakfast.      carvedilol 25 MG tablet   Commonly known as: COREG   Take 25 mg by mouth 2 (two) times daily with a meal.      furosemide 80 MG tablet   Commonly known as: LASIX   Take 80 mg by mouth 2 (two) times daily.      gabapentin 300 MG capsule   Commonly known as: NEURONTIN   Take 300 mg by mouth 2 (two) times daily.      HYDROcodone-acetaminophen 7.5-325 MG per tablet   Commonly known as: NORCO   Take 1 tablet by mouth every 8 (eight) hours as needed. Pain      HYDROXYCHLOROQUINE SULFATE PO   Take 300 mg by mouth 2 (two) times daily.      insulin aspart 100 UNIT/ML injection   Commonly known as: novoLOG   Inject 50 Units into the skin 3 (three) times daily before meals.     insulin NPH 100 UNIT/ML injection   Commonly known as: HUMULIN N,NOVOLIN N   Inject 90 Units into the skin 2 (two) times daily.      lisinopril 10 MG tablet   Commonly known as: PRINIVIL,ZESTRIL   Take 10 mg by mouth 2 (two) times daily.      omeprazole 20 MG capsule   Commonly known as: PRILOSEC   Take 20 mg by mouth every morning.      pantoprazole 40 MG tablet   Commonly known as: PROTONIX   Take 1 tablet (40 mg total) by mouth 2 (two) times daily before a meal.      pravastatin 80 MG tablet   Commonly known as: PRAVACHOL   Take 80 mg by mouth at bedtime.      traMADol 50 MG tablet   Commonly known as: ULTRAM   Take 50 mg by mouth every 8 (eight) hours as needed. Maximum dose= 8 tablets per day. Pain              Disposition and Follow-up: Pt will need to follow up with surgery in 2-3  weeks after discharge and with GI if the abdominal pain recurrs for questionable MRCP which was not done during the hospitalization.  Consults:  GI - ? MRCP, Surgery - laparoscopic cholecystectomy  Significant Diagnostic Studies:   Dg Chest 2 View 01/16/2012  IMPRESSION: Cardiomegaly and hyperinflation.  No congestive failure or acute process.  Both views degraded, as detailed above.  Apparent superior mediastinal soft tissue fullness is likely due to technique/patient positioning.    US Abdomen Complete 01/16/2012  IMPRESSION: 1.  Cholelithiasis without evidence of cholecystitis. 2.  Apparent 1.2 cm area of increased echogenicity within the left lobe of the liver is indeterminate but in the absence of a known primary malignancy likely represents a small hemangioma.  A followup ultrasound is 6 months may be obtained to ensure continued stability.  If a more aggressive workup is desired, further evaluation may be obtained with an abdominal MRI.  These results will be called to the ordering clinician or representative by the Radiologist Assistant, and communication documented in the PACS  Dashboard.     Brief H and P:  Mr. Mitchell Parker is a 61 year old male with multiple medical problems most notable for CHF, COPD, OSA, diabetes, morbid obesity and cholelithiasis. He has had intermittent right upper quadrant pain for over a year and has seen Dr. Gerrit Friends back in November 2012 and was scheduled to have an elective laparoscopic cholecystectomy on Monday. Patient has had worsening right upper quadrant pain associated with nausea and vomiting that started one week prior to admission and has presented to the emergency room. He had an ultrasound done in the ER which did not show evidence of acute acute cholecystitis, he was seen by Dr. Derrell Lolling with general surgery who requested triad hospitalists admit the patient due to multiple medical problems.  Physical Exam on Discharge:  Filed Vitals:   01/20/12 1430 01/20/12 2240 01/21/12 0208 01/21/12 0633  BP: 118/52 139/75 137/74 159/77  Pulse: 64 71 71 69  Temp: 98.4 F (36.9 C) 97.6 F (36.4 C) 98.7 F (37.1 C) 97.6 F (36.4 C)  TempSrc: Oral Oral Oral Oral  Resp: 20 20 18 20   Height:      Weight:      SpO2: 96% 92% 93% 95%     Intake/Output Summary (Last 24 hours) at 01/21/12 0856 Last data filed at 01/21/12 0400  Gross per 24 hour  Intake 3088.75 ml  Output   1910 ml  Net 1178.75 ml    General: Alert, awake, oriented x3, in no acute distress. HEENT: No bruits, no goiter. Heart: Regular rate and rhythm, without murmurs, rubs, gallops. Lungs: Clear to auscultation bilaterally. Abdomen: Soft, nontender, nondistended, positive bowel sounds. Extremities: No clubbing cyanosis or edema with positive pedal pulses. Neuro: Grossly intact, nonfocal.  CBC:    Component Value Date/Time   WBC 11.2* 01/20/2012 0527   HGB 12.5* 01/20/2012 0527   HCT 37.1* 01/20/2012 0527   PLT 166 01/20/2012 0527   MCV 85.5 01/20/2012 0527   NEUTROABS 13.3* 10/26/2011 0530   LYMPHSABS 0.8 10/26/2011 0530   MONOABS 1.3* 10/26/2011 0530   EOSABS 0.1  10/26/2011 0530   BASOSABS 0.0 10/26/2011 0530    Basic Metabolic Panel:    Component Value Date/Time   NA 134* 01/20/2012 0527   K 4.7 01/20/2012 0527   CL 96 01/20/2012 0527   CO2 22 01/20/2012 0527   BUN 44* 01/20/2012 0527   CREATININE 1.91* 01/20/2012 0527   GLUCOSE 264* 01/20/2012 0527   CALCIUM 8.6 01/20/2012  4098    Hospital Course:  Active Problems:  Cholelithiases - s/p laparoscopic cholecystectomy - pt has tolerated surgery well and will need an outpatient follow up - pt was also advised to follow up with Gi and please note that the decision was made to defer MRCP for later time and only if indicated in case of recurrent pain   CHF (congestive heart failure) - this was stable during the hospitalization   COPD (chronic obstructive pulmonary disease) - well controlled during the hospitalization   OSA on CPAP - CPAP was provided during the stay   Diabetes mellitus - well controlled during the hospitalization   Disposition - plan of care and diagnosis, diagnostic studies and test results were discussed with pt and family at bedside - pt and  family verbalized understanding  Time spent on Discharge: Over 30 minutes  Signed: Debbora Presto 01/21/2012, 8:56 AM   [phone]

## 2012-01-21 NOTE — Progress Notes (Signed)
General Surgery Note  LOS: 5 days  POD# 3 GI - Dr. Greggory Brandy, PCP - Dr. Konrad Dolores of [facility].  Assessment/Plan:  1.  LAPAROSCOPIC CHOLECYSTECTOMY WITH INTRAOPERATIVE CHOLANGIOGRAM by Dr. Warren Lacy on 01/18/2012.   JP drain removed today.  Doing well from our standpoint. Agree with discharge today.   2.  Obesity, morbid.  3.  CHF (congestive heart failure)  4.  COPD (chronic obstructive pulmonary disease)     5.  OSA on CPAP   6.  Diabetes mellitus.  7.  History of poly arthritis    Subjective:  Feels much better.  Ready to go home.  To follow up with Dr. Luisa Hart in 2 to 3 weeks. Objective:   Filed Vitals:   01/21/12 0633  BP: 159/77  Pulse: 69  Temp: 97.6 F (36.4 C)  Resp: 20     Intake/Output from previous day:  01/25 0701 - 01/26 0700 In: 3088.8 [P.O.:480; I.V.:2608.8] Out: 1910 [Urine:1725; Drains:185]  Intake/Output this shift:      Physical Exam:   General: Morbidly morbidly obese WM who is alert and oriented.  He moves very slowly because of size and joint pain.   HEENT: Normal. Pupils equal. Good dentition. .   Lungs: Clear.   Abdomen: Obese.  BS present.   Wound: Clear.  Some scabs about someof the incisions.   Neurologic:  Grossly intact to motor and sensory function.   Psychiatric: Has normal mood and affect. Behavior is normal   Lab Results:    Basename 01/20/12 0527 01/19/12 0514  WBC 11.2* 13.6*  HGB 12.5* 12.6*  HCT 37.1* 37.4*  PLT 166 169    BMET   Basename 01/20/12 0527 01/19/12 0514  NA 134* 135  K 4.7 4.5  CL 96 97  CO2 22 28  GLUCOSE 264* 273*  BUN 44* 33*  CREATININE 1.91* 2.11*  CALCIUM 8.6 9.1    PT/INR  No results found for this basename: LABPROT:2,INR:2 in the last 72 hours  ABG  No results found for this basename: PHART:2,PCO2:2,PO2:2,HCO3:2 in the last 72 hours   Studies/Results:  No results found.   Anti-infectives:   Anti-infectives     Start     Dose/Rate Route Frequency Ordered Stop   01/21/12 0000   azithromycin  (ZITHROMAX) 250 MG tablet           01/21/12 0855 01/26/12 2359   01/20/12 1300   azithromycin (ZITHROMAX) tablet 250 mg        250 mg Oral Daily 01/20/12 1158     01/20/12 0600   vancomycin (VANCOCIN) 1,750 mg in sodium chloride 0.9 % 500 mL IVPB  Status:  Discontinued        1,750 mg 250 mL/hr over 120 Minutes Intravenous Every 24 hours 01/20/12 0428 01/20/12 1158   01/19/12 1000   vancomycin (VANCOCIN) 1,500 mg in sodium chloride 0.9 % 500 mL IVPB  Status:  Discontinued        1,500 mg 250 mL/hr over 120 Minutes Intravenous Every 12 hours 01/18/12 2204 01/19/12 1106   01/18/12 2300   piperacillin-tazobactam (ZOSYN) IVPB 3.375 g  Status:  Discontinued        3.375 g 12.5 mL/hr over 240 Minutes Intravenous 3 times per day 01/18/12 2154 01/20/12 1158   01/18/12 2300   vancomycin (VANCOCIN) 2,000 mg in sodium chloride 0.9 % 500 mL IVPB        2,000 mg 250 mL/hr over 120 Minutes Intravenous  Once 01/18/12 2204 01/19/12  1610   01/18/12 2200   piperacillin-tazobactam (ZOSYN) IVPB 2.25 g  Status:  Discontinued        2.25 g 100 mL/hr over 30 Minutes Intravenous Every 12 hours 01/18/12 2122 01/18/12 2139   01/18/12 2130   vancomycin (VANCOCIN) IVPB 1000 mg/200 mL premix  Status:  Discontinued        1,000 mg 200 mL/hr over 60 Minutes Intravenous Every 12 hours 01/18/12 2122 01/18/12 2139   01/17/12 1200   ceFAZolin (ANCEF) IVPB 2 g/50 mL premix        2 g 100 mL/hr over 30 Minutes Intravenous 60 min pre-op 01/17/12 1156 01/18/12 0900   01/17/12 1000   Ampicillin-Sulbactam (UNASYN) 3 g in sodium chloride 0.9 % 100 mL IVPB  Status:  Discontinued        3 g 100 mL/hr over 60 Minutes Intravenous Every 6 hours 01/17/12 0839 01/18/12 2122   01/16/12 2300  hydroxychloroquine (PLAQUENIL) tablet 200 mg       200 mg Oral 2 times daily 01/16/12 2220     01/16/12 1900   ertapenem (INVANZ) 1 g in sodium chloride 0.9 % 50 mL IVPB  Status:  Discontinued        1 g 100 mL/hr over 30 Minutes  Intravenous Every 24 hours 01/16/12 1858 01/17/12 0741          Ovidio Kin, MD, FACS Pager: [phone],   [facility] Office: [phone] 01/21/2012

## 2012-01-23 ENCOUNTER — Encounter (HOSPITAL_COMMUNITY): Admission: RE | Payer: Self-pay | Source: Ambulatory Visit

## 2012-01-23 ENCOUNTER — Ambulatory Visit (HOSPITAL_COMMUNITY): Admission: RE | Admit: 2012-01-23 | Payer: Medicare Other | Source: Ambulatory Visit | Admitting: Surgery

## 2012-01-23 SURGERY — LAPAROSCOPIC CHOLECYSTECTOMY WITH INTRAOPERATIVE CHOLANGIOGRAM
Anesthesia: General

## 2012-01-31 ENCOUNTER — Encounter (INDEPENDENT_AMBULATORY_CARE_PROVIDER_SITE_OTHER): Payer: Medicare Other

## 2012-02-08 ENCOUNTER — Ambulatory Visit (INDEPENDENT_AMBULATORY_CARE_PROVIDER_SITE_OTHER): Payer: Medicare Other | Admitting: Surgery

## 2012-02-08 ENCOUNTER — Encounter (INDEPENDENT_AMBULATORY_CARE_PROVIDER_SITE_OTHER): Payer: Self-pay | Admitting: Surgery

## 2012-02-08 VITALS — BP 182/84 | HR 80 | Temp 98.6°F | Resp 26 | Ht 73.0 in | Wt 347.6 lb

## 2012-02-08 DIAGNOSIS — K8010 Calculus of gallbladder with chronic cholecystitis without obstruction: Secondary | ICD-10-CM

## 2012-02-08 NOTE — Patient Instructions (Signed)
  COCOA BUTTER & VITAMIN E CREAM  ([employer] or other brand)  Apply cocoa butter/vitamin E cream to your incision 2 - 3 times daily.  Massage cream into incision for one minute with each application.  Use sunscreen (50 SPF or higher) for first 6 months after surgery if area is exposed to sun.  You may substitute Mederma or other scar reducing creams as desired.   

## 2012-02-08 NOTE — Progress Notes (Signed)
Visit Diagnoses: 1. Cholelithiasis with cholecystitis     HISTORY: The patient returns for postoperative appointment having undergone laparoscopic cholecystectomy with intraoperative cholangiography by my partner. Patient had originally been seen by me in early January. However due to the onset of acute symptoms he required urgent surgery which was performed by my partner. Postoperatively the patient has done very well.  EXAM: Abdomen is soft obese nontender. Surgical wounds are healing without complication. No sign of hernia. Right upper quadrant is soft nontender without mass.  IMPRESSION: Status post laparoscopic cholecystectomy with intraoperative cholangiography for chronic cholecystitis and cholelithiasis  PLAN: Patient will begin applying topical creams to his incisions. He will be careful with lifting for the next 2 weeks. He will then return to full activity. I am giving him a return to work slip dated for February 18.  Patient will return to see [place] in this office as needed.  Velora Heckler, MD, FACS General & Endocrine Surgery [facility]

## 2012-04-17 ENCOUNTER — Encounter (HOSPITAL_COMMUNITY): Payer: Self-pay

## 2012-04-17 ENCOUNTER — Inpatient Hospital Stay (HOSPITAL_COMMUNITY)
Admission: EM | Admit: 2012-04-17 | Discharge: 2012-04-24 | DRG: 176 | Disposition: A | Discharge status: Home / Self Care | Payer: Medicare Other | Attending: Internal Medicine | Admitting: Internal Medicine

## 2012-04-17 ENCOUNTER — Emergency Department (HOSPITAL_COMMUNITY): Payer: Medicare Other

## 2012-04-17 DIAGNOSIS — D72829 Elevated white blood cell count, unspecified: Secondary | ICD-10-CM | POA: Diagnosis present

## 2012-04-17 DIAGNOSIS — K529 Noninfective gastroenteritis and colitis, unspecified: Secondary | ICD-10-CM

## 2012-04-17 DIAGNOSIS — N184 Chronic kidney disease, stage 4 (severe): Secondary | ICD-10-CM | POA: Diagnosis present

## 2012-04-17 DIAGNOSIS — N1832 Chronic kidney disease, stage 3b: Secondary | ICD-10-CM | POA: Diagnosis present

## 2012-04-17 DIAGNOSIS — E1122 Type 2 diabetes mellitus with diabetic chronic kidney disease: Secondary | ICD-10-CM | POA: Diagnosis present

## 2012-04-17 DIAGNOSIS — I82409 Acute embolism and thrombosis of unspecified deep veins of unspecified lower extremity: Secondary | ICD-10-CM | POA: Diagnosis present

## 2012-04-17 DIAGNOSIS — R197 Diarrhea, unspecified: Secondary | ICD-10-CM | POA: Diagnosis present

## 2012-04-17 DIAGNOSIS — E1149 Type 2 diabetes mellitus with other diabetic neurological complication: Secondary | ICD-10-CM | POA: Diagnosis present

## 2012-04-17 DIAGNOSIS — J4489 Other specified chronic obstructive pulmonary disease: Secondary | ICD-10-CM | POA: Diagnosis present

## 2012-04-17 DIAGNOSIS — I2699 Other pulmonary embolism without acute cor pulmonale: Principal | ICD-10-CM | POA: Diagnosis present

## 2012-04-17 DIAGNOSIS — I509 Heart failure, unspecified: Secondary | ICD-10-CM

## 2012-04-17 DIAGNOSIS — Z9989 Dependence on other enabling machines and devices: Secondary | ICD-10-CM

## 2012-04-17 DIAGNOSIS — R7989 Other specified abnormal findings of blood chemistry: Secondary | ICD-10-CM | POA: Diagnosis present

## 2012-04-17 DIAGNOSIS — R9431 Abnormal electrocardiogram [ECG] [EKG]: Secondary | ICD-10-CM | POA: Diagnosis present

## 2012-04-17 DIAGNOSIS — I129 Hypertensive chronic kidney disease with stage 1 through stage 4 chronic kidney disease, or unspecified chronic kidney disease: Secondary | ICD-10-CM | POA: Diagnosis present

## 2012-04-17 DIAGNOSIS — M069 Rheumatoid arthritis, unspecified: Secondary | ICD-10-CM | POA: Diagnosis present

## 2012-04-17 DIAGNOSIS — I5032 Chronic diastolic (congestive) heart failure: Secondary | ICD-10-CM | POA: Diagnosis present

## 2012-04-17 DIAGNOSIS — I10 Essential (primary) hypertension: Secondary | ICD-10-CM

## 2012-04-17 DIAGNOSIS — N390 Urinary tract infection, site not specified: Secondary | ICD-10-CM | POA: Diagnosis present

## 2012-04-17 DIAGNOSIS — G4733 Obstructive sleep apnea (adult) (pediatric): Secondary | ICD-10-CM

## 2012-04-17 DIAGNOSIS — E860 Dehydration: Secondary | ICD-10-CM | POA: Diagnosis present

## 2012-04-17 DIAGNOSIS — N189 Chronic kidney disease, unspecified: Secondary | ICD-10-CM | POA: Diagnosis present

## 2012-04-17 DIAGNOSIS — K8010 Calculus of gallbladder with chronic cholecystitis without obstruction: Secondary | ICD-10-CM

## 2012-04-17 DIAGNOSIS — J449 Chronic obstructive pulmonary disease, unspecified: Secondary | ICD-10-CM | POA: Diagnosis present

## 2012-04-17 DIAGNOSIS — R778 Other specified abnormalities of plasma proteins: Secondary | ICD-10-CM | POA: Diagnosis present

## 2012-04-17 DIAGNOSIS — E1142 Type 2 diabetes mellitus with diabetic polyneuropathy: Secondary | ICD-10-CM | POA: Diagnosis present

## 2012-04-17 DIAGNOSIS — R112 Nausea with vomiting, unspecified: Secondary | ICD-10-CM | POA: Diagnosis present

## 2012-04-17 DIAGNOSIS — A498 Other bacterial infections of unspecified site: Secondary | ICD-10-CM | POA: Diagnosis present

## 2012-04-17 DIAGNOSIS — E119 Type 2 diabetes mellitus without complications: Secondary | ICD-10-CM | POA: Diagnosis present

## 2012-04-17 DIAGNOSIS — R799 Abnormal finding of blood chemistry, unspecified: Secondary | ICD-10-CM | POA: Diagnosis present

## 2012-04-17 DIAGNOSIS — N183 Chronic kidney disease, stage 3 unspecified: Secondary | ICD-10-CM | POA: Diagnosis present

## 2012-04-17 DIAGNOSIS — M109 Gout, unspecified: Secondary | ICD-10-CM | POA: Diagnosis present

## 2012-04-17 DIAGNOSIS — G5700 Lesion of sciatic nerve, unspecified lower limb: Secondary | ICD-10-CM

## 2012-04-17 DIAGNOSIS — N179 Acute kidney failure, unspecified: Secondary | ICD-10-CM | POA: Diagnosis present

## 2012-04-17 DIAGNOSIS — Z794 Long term (current) use of insulin: Secondary | ICD-10-CM

## 2012-04-17 DIAGNOSIS — K3184 Gastroparesis: Secondary | ICD-10-CM | POA: Diagnosis present

## 2012-04-17 DIAGNOSIS — R509 Fever, unspecified: Secondary | ICD-10-CM | POA: Diagnosis present

## 2012-04-17 DIAGNOSIS — R55 Syncope and collapse: Secondary | ICD-10-CM | POA: Diagnosis present

## 2012-04-17 DIAGNOSIS — K8020 Calculus of gallbladder without cholecystitis without obstruction: Secondary | ICD-10-CM

## 2012-04-17 HISTORY — DX: Noninfective gastroenteritis and colitis, unspecified: K52.9

## 2012-04-17 LAB — PROTIME-INR
INR: 1.06 (ref 0.00–1.49)
Prothrombin Time: 14 seconds (ref 11.6–15.2)

## 2012-04-17 LAB — COMPREHENSIVE METABOLIC PANEL
ALT: 20 U/L (ref 0–53)
AST: 34 U/L (ref 0–37)
Albumin: 3.6 g/dL (ref 3.5–5.2)
Alkaline Phosphatase: 60 U/L (ref 39–117)
BUN: 20 mg/dL (ref 6–23)
CO2: 25 mEq/L (ref 19–32)
Calcium: 8.7 mg/dL (ref 8.4–10.5)
Chloride: 99 mEq/L (ref 96–112)
Creatinine, Ser: 1.71 mg/dL — ABNORMAL HIGH (ref 0.50–1.35)
GFR calc Af Amer: 49 mL/min — ABNORMAL LOW (ref >90)
GFR calc non Af Amer: 42 mL/min — ABNORMAL LOW (ref >90)
Glucose, Bld: 246 mg/dL — ABNORMAL HIGH (ref 70–99)
Potassium: 3.8 mEq/L (ref 3.5–5.1)
Sodium: 139 mEq/L (ref 135–145)
Total Bilirubin: 0.6 mg/dL (ref 0.3–1.2)
Total Protein: 7.3 g/dL (ref 6.0–8.3)

## 2012-04-17 LAB — DIFFERENTIAL
Basophils Absolute: 0 10*3/uL (ref 0.0–0.1)
Basophils Relative: 1 % (ref 0–1)
Eosinophils Absolute: 0.1 10*3/uL (ref 0.0–0.7)
Eosinophils Relative: 1 % (ref 0–5)
Lymphocytes Relative: 16 % (ref 12–46)
Lymphs Abs: 1.2 10*3/uL (ref 0.7–4.0)
Monocytes Absolute: 0.8 10*3/uL (ref 0.1–1.0)
Monocytes Relative: 10 % (ref 3–12)
Neutro Abs: 5.5 10*3/uL (ref 1.7–7.7)
Neutrophils Relative %: 72 % (ref 43–77)

## 2012-04-17 LAB — CBC
HCT: 45 % (ref 39.0–52.0)
Hemoglobin: 15.3 g/dL (ref 13.0–17.0)
MCH: 28.4 pg (ref 26.0–34.0)
MCHC: 34 g/dL (ref 30.0–36.0)
MCV: 83.6 fL (ref 78.0–100.0)
Platelets: 183 10*3/uL (ref 150–400)
RBC: 5.38 MIL/uL (ref 4.22–5.81)
RDW: 15.1 % (ref 11.5–15.5)
WBC: 7.7 10*3/uL (ref 4.0–10.5)

## 2012-04-17 LAB — CARDIAC PANEL(CRET KIN+CKTOT+MB+TROPI)
CK, MB: 3 ng/mL (ref 0.3–4.0)
Relative Index: 2.3 (ref 0.0–2.5)
Total CK: 130 U/L (ref 7–232)
Troponin I: 0.33 ng/mL (ref <0.30)

## 2012-04-17 LAB — GLUCOSE, CAPILLARY
Glucose-Capillary: 324 mg/dL — ABNORMAL HIGH (ref 70–99)
Glucose-Capillary: 228 mg/dL — ABNORMAL HIGH (ref 70–99)

## 2012-04-17 LAB — APTT: aPTT: 32 seconds (ref 24–37)

## 2012-04-17 LAB — PRO B NATRIURETIC PEPTIDE: Pro B Natriuretic peptide (BNP): 11286 pg/mL — ABNORMAL HIGH (ref 0–125)

## 2012-04-17 LAB — LIPASE, BLOOD: Lipase: 15 U/L (ref 11–59)

## 2012-04-17 MED ORDER — DOCUSATE SODIUM 100 MG PO CAPS
100.0000 mg | ORAL_CAPSULE | Freq: Two times a day (BID) | ORAL | Status: DC
  Filled 2012-04-17 (×3): qty 1

## 2012-04-17 MED ORDER — SODIUM CHLORIDE 0.9 % IV BOLUS (SEPSIS)
1000.0000 mL | Freq: Once | INTRAVENOUS | Status: AC
  Administered 2012-04-17: 1000 mL via INTRAVENOUS

## 2012-04-17 MED ORDER — ONDANSETRON HCL 4 MG/2ML IJ SOLN
4.0000 mg | Freq: Four times a day (QID) | INTRAMUSCULAR | Status: DC | PRN
  Administered 2012-04-20 – 2012-04-22 (×6): 4 mg via INTRAVENOUS
  Filled 2012-04-17 (×7): qty 2

## 2012-04-17 MED ORDER — HYDROXYCHLOROQUINE SULFATE 200 MG PO TABS
200.0000 mg | ORAL_TABLET | Freq: Every day | ORAL | Status: DC
  Administered 2012-04-18 – 2012-04-24 (×7): 200 mg via ORAL
  Filled 2012-04-17 (×7): qty 1

## 2012-04-17 MED ORDER — SENNA 8.6 MG PO TABS
1.0000 | ORAL_TABLET | Freq: Two times a day (BID) | ORAL | Status: DC
  Administered 2012-04-18 – 2012-04-23 (×6): 8.6 mg via ORAL
  Filled 2012-04-17 (×8): qty 1

## 2012-04-17 MED ORDER — SODIUM CHLORIDE 0.9 % IV SOLN
INTRAVENOUS | Status: DC
  Administered 2012-04-18: 01:00:00 via INTRAVENOUS

## 2012-04-17 MED ORDER — SIMVASTATIN 40 MG PO TABS
40.0000 mg | ORAL_TABLET | Freq: Every day | ORAL | Status: DC
  Filled 2012-04-17: qty 1

## 2012-04-17 MED ORDER — ACETAMINOPHEN 325 MG PO TABS
650.0000 mg | ORAL_TABLET | Freq: Four times a day (QID) | ORAL | Status: DC | PRN
  Administered 2012-04-18 (×3): 650 mg via ORAL
  Filled 2012-04-17 (×3): qty 2

## 2012-04-17 MED ORDER — HEPARIN SODIUM (PORCINE) 5000 UNIT/ML IJ SOLN: 5000.0000 [IU] | Freq: Three times a day (TID) | INTRAMUSCULAR | Status: DC

## 2012-04-17 MED ORDER — IPRATROPIUM-ALBUTEROL 18-103 MCG/ACT IN AERO
2.0000 | INHALATION_SPRAY | Freq: Four times a day (QID) | RESPIRATORY_TRACT | Status: DC | PRN
  Filled 2012-04-17: qty 14.7

## 2012-04-17 MED ORDER — CALCITRIOL 0.25 MCG PO CAPS
0.2500 ug | ORAL_CAPSULE | Freq: Every day | ORAL | Status: DC
  Administered 2012-04-18 – 2012-04-24 (×7): 0.25 ug via ORAL
  Filled 2012-04-17 (×8): qty 1

## 2012-04-17 MED ORDER — LISINOPRIL 10 MG PO TABS
10.0000 mg | ORAL_TABLET | Freq: Two times a day (BID) | ORAL | Status: DC
  Administered 2012-04-18 – 2012-04-19 (×4): 10 mg via ORAL
  Filled 2012-04-17 (×5): qty 1

## 2012-04-17 MED ORDER — SODIUM CHLORIDE 0.9 % IJ SOLN
3.0000 mL | Freq: Two times a day (BID) | INTRAMUSCULAR | Status: DC
  Administered 2012-04-18 – 2012-04-24 (×7): 3 mL via INTRAVENOUS

## 2012-04-17 MED ORDER — ONDANSETRON HCL 4 MG PO TABS: 4.0000 mg | ORAL_TABLET | Freq: Four times a day (QID) | ORAL | Status: DC | PRN

## 2012-04-17 MED ORDER — HEPARIN (PORCINE) IN NACL 100-0.45 UNIT/ML-% IJ SOLN
1000.0000 [IU]/h | INTRAMUSCULAR | Status: DC
  Administered 2012-04-17: 1000 [IU]/h via INTRAVENOUS
  Filled 2012-04-17: qty 250

## 2012-04-17 MED ORDER — GABAPENTIN 300 MG PO CAPS
300.0000 mg | ORAL_CAPSULE | Freq: Two times a day (BID) | ORAL | Status: DC
  Administered 2012-04-18 – 2012-04-24 (×13): 300 mg via ORAL
  Filled 2012-04-17 (×15): qty 1

## 2012-04-17 MED ORDER — ONDANSETRON HCL 4 MG/2ML IJ SOLN
4.0000 mg | Freq: Four times a day (QID) | INTRAMUSCULAR | Status: DC | PRN
  Administered 2012-04-17 – 2012-04-20 (×2): 4 mg via INTRAVENOUS
  Filled 2012-04-17 (×2): qty 2

## 2012-04-17 MED ORDER — HEPARIN BOLUS VIA INFUSION
4000.0000 [IU] | Freq: Once | INTRAVENOUS | Status: AC
  Administered 2012-04-17: 4000 [IU] via INTRAVENOUS

## 2012-04-17 MED ORDER — AMLODIPINE BESY-BENAZEPRIL HCL 2.5-10 MG PO CAPS: 1.0000 | ORAL_CAPSULE | Freq: Every day | ORAL | Status: DC

## 2012-04-17 MED ORDER — MORPHINE SULFATE 2 MG/ML IJ SOLN
2.0000 mg | INTRAMUSCULAR | Status: DC | PRN
  Administered 2012-04-19 (×2): 2 mg via INTRAVENOUS
  Filled 2012-04-17 (×2): qty 1

## 2012-04-17 MED ORDER — INSULIN NPH (HUMAN) (ISOPHANE) 100 UNIT/ML ~~LOC~~ SUSP
90.0000 [IU] | Freq: Two times a day (BID) | SUBCUTANEOUS | Status: DC
  Administered 2012-04-18 – 2012-04-19 (×4): 90 [IU] via SUBCUTANEOUS
  Filled 2012-04-17: qty 10

## 2012-04-17 MED ORDER — ACETAMINOPHEN 650 MG RE SUPP: 650.0000 mg | Freq: Four times a day (QID) | RECTAL | Status: DC | PRN

## 2012-04-17 MED ORDER — ASPIRIN EC 325 MG PO TBEC
325.0000 mg | DELAYED_RELEASE_TABLET | Freq: Every day | ORAL | Status: DC
  Administered 2012-04-18 – 2012-04-24 (×7): 325 mg via ORAL
  Filled 2012-04-17 (×7): qty 1

## 2012-04-17 MED ORDER — CARVEDILOL 25 MG PO TABS
25.0000 mg | ORAL_TABLET | Freq: Two times a day (BID) | ORAL | Status: DC
  Administered 2012-04-18 – 2012-04-20 (×5): 25 mg via ORAL
  Filled 2012-04-17 (×7): qty 1

## 2012-04-17 NOTE — ED Provider Notes (Signed)
History     CSN: [id number]  Arrival date & time 04/17/12  1736   First MD Initiated Contact with Patient 04/17/12 2020      Chief Complaint  Patient presents with  . Dizziness  . Near Syncope  . Emesis    (Consider location/radiation/quality/duration/timing/severity/associated sxs/prior treatment) Patient is a 61 y.o. male presenting with vomiting. The history is provided by the patient.  Emesis   He states that he has been having nausea, vomiting, and diarrhea ever since he had gallbladder surgery in January of this year. His primary care doctor recommended that he take Imodium for the diarrhea but it has not helped. Symptoms have been constant with 3-5 bowel movements a day and intermittent vomiting of foul smelling material. Yesterday, he started feeling weak and he got dizzy while going up a flight of steps. He stopped until the dizziness passed and then continued going up the steps but then had a syncopal episode when he reached the top of the steps. He denied palpitations. He denies chest pain, heaviness, tightness, or pressure. He denies diaphoresis. He has noted exertional dyspnea which is getting worse. He denies paroxysmal nocturnal dyspnea and denies orthopnea. He denies abdominal pain. Of note, his symptoms of diarrhea and nausea and vomiting are what he had prior to his cholecystectomy. Nothing makes the symptoms better, nothing makes his symptoms worse except for a dyspnea with exertion.  Past Medical History  Diagnosis Date  . CHF (congestive heart failure)   . Diabetes mellitus   . Neuropathy   . Hypertension   . COPD (chronic obstructive pulmonary disease)   . Gallstones   . RA (rheumatoid arthritis)   . Obese   . Kidney stones   . Piriformis syndrome     left hip  . Lumbar disc disease     compression   . Sleep apnea   . Gout     Past Surgical History  Procedure Date  . Umbilical hernia repair   . Pilonidal cyst excision 1997  . Varicose vein surgery       left leg  . Cholecystectomy 01/18/2012    Procedure: LAPAROSCOPIC CHOLECYSTECTOMY WITH INTRAOPERATIVE CHOLANGIOGRAM;  Surgeon: Clovis Pu. Cornett, MD;  Location: [facility];  Service: General;  Laterality: N/A;    Family History  Problem Relation Age of Onset  . Hypertension Mother   . Diabetes Father   . Diabetes Brother     History  Substance Use Topics  . Smoking status: Former Smoker    Quit date: 12/26/1994  . Smokeless tobacco: Never Used  . Alcohol Use: No      Review of Systems  Gastrointestinal: Positive for vomiting.  All other systems reviewed and are negative.    Allergies  Ciprofloxacin and Levofloxacin  Home Medications   Current Outpatient Rx  Name Route Sig Dispense Refill  . IPRATROPIUM-ALBUTEROL 18-103 MCG/ACT IN AERO Inhalation Inhale 2 puffs into the lungs every 6 (six) hours as needed. Wheezing    . AMLODIPINE BESY-BENAZEPRIL HCL 2.5-10 MG PO CAPS Oral Take 1 capsule by mouth daily.    . ASPIRIN 81 MG PO TABS Oral Take 81 mg by mouth 2 (two) times daily.     [name] CALCITRIOL 0.25 MCG PO CAPS Oral Take 0.25 mcg by mouth daily with breakfast.     . CARVEDILOL 25 MG PO TABS Oral Take 25 mg by mouth 2 (two) times daily with a meal.      . FUROSEMIDE 80 MG PO TABS Oral  Take 80 mg by mouth 2 (two) times daily.      [name] GABAPENTIN 300 MG PO CAPS Oral Take 300 mg by mouth 2 (two) times daily.      [name] HYDROCODONE-ACETAMINOPHEN 7.5-325 MG PO TABS Oral Take 1 tablet by mouth every 8 (eight) hours as needed for pain. Pain 65 tablet 0  . HYDROXYCHLOROQUINE SULFATE PO Oral Take 300 mg by mouth 2 (two) times daily.    . INSULIN ASPART 100 UNIT/ML Jerome SOLN Subcutaneous Inject 50 Units into the skin 3 (three) times daily before meals.    . INSULIN ISOPHANE HUMAN 100 UNIT/ML  SUSP Subcutaneous Inject 90 Units into the skin 2 (two) times daily.      [name] LISINOPRIL 10 MG PO TABS Oral Take 10 mg by mouth 2 (two) times daily. Take 2 tablets in the morning and 1 tablet in the  evening    . OMEPRAZOLE 20 MG PO CPDR Oral Take 20 mg by mouth every morning.      [name] PRAVASTATIN SODIUM 80 MG PO TABS Oral Take 80 mg by mouth at bedtime.      . TRAMADOL HCL 50 MG PO TABS Oral Take 50 mg by mouth every 8 (eight) hours as needed. Maximum dose= 8 tablets per day. Pain       BP 117/71  Pulse 95  Temp(Src) 98.8 F (37.1 C) (Oral)  Resp 19  Wt 342 lb 2 oz (155.187 kg)  SpO2 92%  Physical Exam  Nursing note and vitals reviewed.  61 year old male who is morbidly obese but in no acute distress. Vital signs are normal. Oxygen saturation is 92% which is normal. Head is normocephalic and atraumatic. PERRLA, EOMI without scleral icterus. Oropharynx shows slightly dry mucous membranes. Neck is nontender and supple without adenopathy or JVD. Back is nontender but there is trace presacral edema. Lungs are clear without rales, wheezes, rhonchi. Heart has regular rate and rhythm without murmur. Abdomen is obese, soft, flat, nontender without masses or hepatosplenomegaly. Extremities have 1+ edema, moderate to severe venous stasis changes, full range of motion present. Skin is warm and dry without other rash. Neurologic: Mental status is normal, cranial nerves are intact, there are no focal motor or sensory deficits.  ED Course  Procedures (including critical care time)  Results for orders placed during the hospital encounter of 04/17/12  GLUCOSE, CAPILLARY      Component Value Range   Glucose-Capillary 324 (*) 70 - 99 (mg/dL)  GLUCOSE, CAPILLARY      Component Value Range   Glucose-Capillary 228 (*) 70 - 99 (mg/dL)   Comment 1 Documented in Chart     Comment 2 Notify RN    CBC      Component Value Range   WBC 7.7  4.0 - 10.5 (K/uL)   RBC 5.38  4.22 - 5.81 (MIL/uL)   Hemoglobin 15.3  13.0 - 17.0 (g/dL)   HCT 16.1  09.6 - 04.5 (%)   MCV 83.6  78.0 - 100.0 (fL)   MCH 28.4  26.0 - 34.0 (pg)   MCHC 34.0  30.0 - 36.0 (g/dL)   RDW 40.9  81.1 - 91.4 (%)   Platelets 183  150 - 400  (K/uL)  DIFFERENTIAL      Component Value Range   Neutrophils Relative 72  43 - 77 (%)   Neutro Abs 5.5  1.7 - 7.7 (K/uL)   Lymphocytes Relative 16  12 - 46 (%)   Lymphs Abs 1.2  0.7 -  4.0 (K/uL)   Monocytes Relative 10  3 - 12 (%)   Monocytes Absolute 0.8  0.1 - 1.0 (K/uL)   Eosinophils Relative 1  0 - 5 (%)   Eosinophils Absolute 0.1  0.0 - 0.7 (K/uL)   Basophils Relative 1  0 - 1 (%)   Basophils Absolute 0.0  0.0 - 0.1 (K/uL)  COMPREHENSIVE METABOLIC PANEL      Component Value Range   Sodium 139  135 - 145 (mEq/L)   Potassium 3.8  3.5 - 5.1 (mEq/L)   Chloride 99  96 - 112 (mEq/L)   CO2 25  19 - 32 (mEq/L)   Glucose, Bld 246 (*) 70 - 99 (mg/dL)   BUN 20  6 - 23 (mg/dL)   Creatinine, Ser 1.47 (*) 0.50 - 1.35 (mg/dL)   Calcium 8.7  8.4 - 82.9 (mg/dL)   Total Protein 7.3  6.0 - 8.3 (g/dL)   Albumin 3.6  3.5 - 5.2 (g/dL)   AST 34  0 - 37 (U/L)   ALT 20  0 - 53 (U/L)   Alkaline Phosphatase 60  39 - 117 (U/L)   Total Bilirubin 0.6  0.3 - 1.2 (mg/dL)   GFR calc non Af Amer 42 (*) >90 (mL/min)   GFR calc Af Amer 49 (*) >90 (mL/min)  LIPASE, BLOOD      Component Value Range   Lipase 15  11 - 59 (U/L)  CARDIAC PANEL(CRET KIN+CKTOT+MB+TROPI)      Component Value Range   Total CK 130  7 - 232 (U/L)   CK, MB 3.0  0.3 - 4.0 (ng/mL)   Troponin I 0.33 (*) <0.30 (ng/mL)   Relative Index 2.3  0.0 - 2.5   PRO B NATRIURETIC PEPTIDE      Component Value Range   Pro B Natriuretic peptide (BNP) 11286.0 (*) 0 - 125 (pg/mL)   Dg Chest Portable 1 View  04/17/2012  *RADIOLOGY REPORT*  Clinical Data: Dizziness.  Near-syncope.  Short of breath. Vomiting.  PORTABLE CHEST - 1 VIEW  Comparison: 01/18/2012  Findings: Cardiomegaly stable.  Both lungs are clear.  No evidence of pleural effusion.  No mass or lymphadenopathy identified.  IMPRESSION: Stable cardiomegaly.  No active lung disease.  Original Report Authenticated By: Danae Orleans, M.D.      Date: 04/17/2012  Rate: 97  Rhythm: normal  sinus rhythm  QRS Axis: normal  Intervals: normal  ST/T Wave abnormalities: normal  Conduction Disutrbances:Incomplete right bundle-branch block  Narrative Interpretation: Incomplete right bundle branch block. When compared with ECG of 01/16/2012, no significant changes are seen.  Old EKG Reviewed: unchanged  Workup is significant for markedly elevated BNP and minimally elevated troponin with normal CPK. Because of elevated cardiac markers, he is started on heparin. At this point, I feel his main issue is actually congestive heart failure. He is not provided any stool for evaluation for possible C. difficile testing. Case is discussed with Dr. Kaylyn Layer of triad hospitalists who agrees to admit the patient.  1. Syncope   2. CHF (congestive heart failure)   3. Elevated troponin   4. Diarrhea       MDM  Syncope which may be due to dehydration. He is also at risk for pulmonary embolus and given recent surgery and underlying congestive heart failure. Laboratory evaluation will be done including complete metabolic panel, cardiac markers, and beta natruretic protein, and d-dimer.        Dione Booze, MD 04/17/12 2158

## 2012-04-17 NOTE — Progress Notes (Signed)
ANTICOAGULATION CONSULT NOTE - Initial Consult  Pharmacy Consult for Heparin Indication: ACS/STEMI  Allergies  Allergen Reactions  . Ciprofloxacin Hives  . Levofloxacin (Levaquin) Hives    Patient Measurements: Height: 6' 0.83" (185 cm) Weight: 342 lb 2 oz (155.187 kg) IBW/kg (Calculated) : 79.52  Heparin Dosing Weight: 116.1 kg Vital Signs: Temp: 99.4 F (37.4 C) (04/23 2043) Temp src: Oral (04/23 1745) BP: 115/66 mmHg (04/23 2059) Pulse Rate: 105  (04/23 2059)  Labs:  Basename 04/17/12 2015  HGB 15.3  HCT 45.0  PLT 183  APTT --  LABPROT --  INR --  HEPARINUNFRC --  CREATININE 1.71*  CKTOTAL 130  CKMB 3.0  TROPONINI 0.33*   Estimated Creatinine Clearance: 73.1 ml/min (by C-G formula based on Cr of 1.71).  Medical History: Past Medical History  Diagnosis Date  . CHF (congestive heart failure)   . Diabetes mellitus   . Neuropathy   . Hypertension   . COPD (chronic obstructive pulmonary disease)   . Gallstones   . RA (rheumatoid arthritis)   . Obese   . Kidney stones   . Piriformis syndrome     left hip  . Lumbar disc disease     compression   . Sleep apnea   . Gout     Medications:  Scheduled:    . heparin  4,000 Units Intravenous Once   Infusions:    . heparin      Assessment:  61 year old male presents with complaint of light headedness, nausea/vomiting, near syncopal episode for several days.  Elevated troponin  IV heparin to begin for ACS/STEMI Goal of Therapy:  Heparin level 0.3-0.7 units/ml   Plan:   Obtain baseline PTT, Protime/INR, CBC prior to starting IV heparin  Heparin 4000 unit IV bolus x 1  Heparin drip @ 1000 units/hr  Check heparin level 6 hours after heparin started  Check daily heparin level & CBC  Tamekia Rotter, Joselyn Glassman, PharmD 04/17/2012,9:51 PM

## 2012-04-17 NOTE — ED Notes (Signed)
Pt in from home with c/o light headiness dizziness near syncope episode for several days also states has had n/v denies pain

## 2012-04-18 ENCOUNTER — Encounter (HOSPITAL_COMMUNITY): Payer: Self-pay

## 2012-04-18 ENCOUNTER — Inpatient Hospital Stay (HOSPITAL_COMMUNITY): Payer: Medicare Other

## 2012-04-18 LAB — BASIC METABOLIC PANEL
BUN: 19 mg/dL (ref 6–23)
CO2: 28 mEq/L (ref 19–32)
Calcium: 8.4 mg/dL (ref 8.4–10.5)
Chloride: 100 mEq/L (ref 96–112)
Creatinine, Ser: 1.76 mg/dL — ABNORMAL HIGH (ref 0.50–1.35)
GFR calc Af Amer: 47 mL/min — ABNORMAL LOW (ref >90)
GFR calc non Af Amer: 41 mL/min — ABNORMAL LOW (ref >90)
Glucose, Bld: 192 mg/dL — ABNORMAL HIGH (ref 70–99)
Potassium: 3.5 mEq/L (ref 3.5–5.1)
Sodium: 139 mEq/L (ref 135–145)

## 2012-04-18 LAB — HEMOGLOBIN A1C
Hgb A1c MFr Bld: 7.9 % — ABNORMAL HIGH (ref <5.7)
Mean Plasma Glucose: 180 mg/dL — ABNORMAL HIGH (ref <117)

## 2012-04-18 LAB — CBC
HCT: 41.5 % (ref 39.0–52.0)
Hemoglobin: 13.8 g/dL (ref 13.0–17.0)
MCH: 28.1 pg (ref 26.0–34.0)
MCHC: 33.3 g/dL (ref 30.0–36.0)
MCV: 84.5 fL (ref 78.0–100.0)
Platelets: 177 10*3/uL (ref 150–400)
RBC: 4.91 MIL/uL (ref 4.22–5.81)
RDW: 15.2 % (ref 11.5–15.5)
WBC: 6.7 10*3/uL (ref 4.0–10.5)

## 2012-04-18 LAB — CARDIAC PANEL(CRET KIN+CKTOT+MB+TROPI)
CK, MB: 1.6 ng/mL (ref 0.3–4.0)
CK, MB: 2.2 ng/mL (ref 0.3–4.0)
CK, MB: 2.6 ng/mL (ref 0.3–4.0)
Relative Index: 2.3 (ref 0.0–2.5)
Relative Index: INVALID (ref 0.0–2.5)
Relative Index: INVALID (ref 0.0–2.5)
Total CK: 111 U/L (ref 7–232)
Total CK: 84 U/L (ref 7–232)
Total CK: 97 U/L (ref 7–232)
Troponin I: 0.37 ng/mL (ref <0.30)
Troponin I: <0.3 ng/mL (ref <0.30)
Troponin I: <0.3 ng/mL (ref <0.30)

## 2012-04-18 LAB — GLUCOSE, CAPILLARY
Glucose-Capillary: 179 mg/dL — ABNORMAL HIGH (ref 70–99)
Glucose-Capillary: 158 mg/dL — ABNORMAL HIGH (ref 70–99)
Glucose-Capillary: 144 mg/dL — ABNORMAL HIGH (ref 70–99)

## 2012-04-18 LAB — HEPARIN LEVEL (UNFRACTIONATED): Heparin Unfractionated: <0.1 IU/mL — ABNORMAL LOW (ref 0.30–0.70)

## 2012-04-18 MED ORDER — WARFARIN - PHARMACIST DOSING INPATIENT: Freq: Every day | Status: DC

## 2012-04-18 MED ORDER — BENAZEPRIL HCL 10 MG PO TABS
10.0000 mg | ORAL_TABLET | Freq: Every day | ORAL | Status: DC
  Administered 2012-04-18 – 2012-04-19 (×2): 10 mg via ORAL
  Filled 2012-04-18 (×2): qty 1

## 2012-04-18 MED ORDER — WARFARIN SODIUM 10 MG PO TABS
10.0000 mg | ORAL_TABLET | Freq: Once | ORAL | Status: AC
  Administered 2012-04-18: 10 mg via ORAL
  Filled 2012-04-18: qty 1

## 2012-04-18 MED ORDER — TECHNETIUM TO 99M ALBUMIN AGGREGATED: 3.3000 | Freq: Once | INTRAVENOUS | Status: AC | PRN

## 2012-04-18 MED ORDER — HEPARIN BOLUS VIA INFUSION
3500.0000 [IU] | Freq: Once | INTRAVENOUS | Status: AC
  Administered 2012-04-18: 3500 [IU] via INTRAVENOUS
  Filled 2012-04-18: qty 3500

## 2012-04-18 MED ORDER — AMLODIPINE BESYLATE 2.5 MG PO TABS
2.5000 mg | ORAL_TABLET | Freq: Every day | ORAL | Status: DC
  Administered 2012-04-18 – 2012-04-20 (×3): 2.5 mg via ORAL
  Filled 2012-04-18 (×3): qty 1

## 2012-04-18 MED ORDER — HEPARIN (PORCINE) IN NACL 100-0.45 UNIT/ML-% IJ SOLN
1400.0000 [IU]/h | INTRAMUSCULAR | Status: DC
  Administered 2012-04-18: 1400 [IU]/h via INTRAVENOUS
  Filled 2012-04-18 (×2): qty 250

## 2012-04-18 MED ORDER — XENON XE 133 GAS: 10.0000 | GAS_FOR_INHALATION | Freq: Once | RESPIRATORY_TRACT | Status: AC | PRN

## 2012-04-18 MED ORDER — ENOXAPARIN SODIUM 150 MG/ML ~~LOC~~ SOLN
150.0000 mg | Freq: Two times a day (BID) | SUBCUTANEOUS | Status: DC
  Administered 2012-04-18 – 2012-04-19 (×3): 150 mg via SUBCUTANEOUS
  Filled 2012-04-18 (×4): qty 1

## 2012-04-18 MED ORDER — WARFARIN VIDEO
Freq: Once | Status: AC
  Administered 2012-04-19: 12:00:00

## 2012-04-18 MED ORDER — SODIUM CHLORIDE 0.9 % IV SOLN: INTRAVENOUS | Status: AC

## 2012-04-18 MED ORDER — ATORVASTATIN CALCIUM 20 MG PO TABS
20.0000 mg | ORAL_TABLET | Freq: Every day | ORAL | Status: DC
  Administered 2012-04-18 – 2012-04-23 (×6): 20 mg via ORAL
  Filled 2012-04-18 (×7): qty 1

## 2012-04-18 MED ORDER — METOCLOPRAMIDE HCL 5 MG PO TABS
5.0000 mg | ORAL_TABLET | Freq: Three times a day (TID) | ORAL | Status: DC
  Administered 2012-04-18 – 2012-04-24 (×21): 5 mg via ORAL
  Filled 2012-04-18 (×27): qty 1

## 2012-04-18 MED ORDER — PATIENT'S GUIDE TO USING COUMADIN BOOK
Freq: Once | Status: AC
  Administered 2012-04-18: 18:00:00
  Filled 2012-04-18: qty 1

## 2012-04-18 MED ORDER — INSULIN ASPART 100 UNIT/ML ~~LOC~~ SOLN
25.0000 [IU] | Freq: Three times a day (TID) | SUBCUTANEOUS | Status: DC
  Administered 2012-04-18: 25 [IU] via SUBCUTANEOUS
  Administered 2012-04-18 – 2012-04-19 (×2): 30 [IU] via SUBCUTANEOUS
  Administered 2012-04-21 – 2012-04-22 (×2): 35 [IU] via SUBCUTANEOUS
  Administered 2012-04-22: 30 [IU] via SUBCUTANEOUS
  Administered 2012-04-22: 35 [IU] via SUBCUTANEOUS
  Administered 2012-04-23: 30 [IU] via SUBCUTANEOUS
  Administered 2012-04-23: 40 [IU] via SUBCUTANEOUS
  Administered 2012-04-24: 50 [IU] via SUBCUTANEOUS
  Administered 2012-04-24: 40 [IU] via SUBCUTANEOUS

## 2012-04-18 NOTE — Progress Notes (Signed)
ANTICOAGULATION CONSULT NOTE - Initial Consult  Pharmacy Consult for Lovenox and Coumadin Indication:  Acute pulmonary embolus  Allergies  Allergen Reactions  . Ciprofloxacin Hives  . Levofloxacin (Levaquin) Hives    Patient Measurements: Height: 6' 0.83" (185 cm) Weight: 345 lb 14.4 oz (156.9 kg) (bed scale) IBW/kg (Calculated) : 79.52   Vital Signs: Temp: 99.5 F (37.5 C) (04/24 1601) Temp src: Oral (04/24 1601) BP: 102/65 mmHg (04/24 1601) Pulse Rate: 81  (04/24 1601)  Labs: 04/17/12  PT 14.0  INR 1.06 04/18/12  Hgb 13.8  Hct 41.5  Platelets 177  BUN 19  SCr 1.76         Estimated Creatinine Clearance: 71.5 ml/min (by C-G formula based on Cr of 1.76).  Assessment: Asked to assist with Lovenox and Coumadin therapy for 62 year-old WM with acute pulmonary embolus and chronic renal insufficiency.  (Mr. Dagostino previously received IV Heparin for suspected ACS.)  Will order the usual Coumadin dose according to patient parameters, and a slightly reduced Lovenox dose with a Heparin level after the third dose to evaluate renal clearance of the drug.  Goals of Therapy:   INR in range 2-3  Heparin level 0.6-1.2 units/ml drawn 4 hours after Lovenox dose (peak level)   Plan:   Lovenox 150 mg SQ every 12 hours (for 1mg /kg per dose)  Heparin level 4 hours after 3rd Lovenox dose  Coumadin 10 mg as initial dose  PT/INR daily  Coumadin education  [employer] R.Ph. 04/18/2012,5:08 PM

## 2012-04-18 NOTE — Progress Notes (Addendum)
Patient seen and examined. Agree with Ms. Maia Plan note.  1. Syncope/SOB/minimally elevated Trop: Important to consider VTE. Will get VQ scan considering elevated creatinine. ECHO pending. Unlikely to be NSTEMI. Continue heparin till VTE is ruled out.  2. Chronic Nausea/Vomiting/Diarrhea: Unclear etiology. Never tested for gastroparesis. Needs GI evaluation at some point. Can consider trial of reglan.  Other issues as outlined in note.  Mitchell Parker 2:30 PM  V/Q scan reported as high probability for PE. Discussed with patient. Will initiate Lovenox and Warfarin.  Mitchell Parker 4:20 PM

## 2012-04-18 NOTE — Progress Notes (Addendum)
Two attempts were made to complete lower extremity venous duplex @ 2:45 PM and 3:50 PM 04/18/2012. Patient in VQ scan.  Exam will be completed in the AM 04/19/2012   [place], [place], RVS 4:30 PM 04/18/2012

## 2012-04-18 NOTE — Progress Notes (Signed)
Subjective: Sitting up in chair. Reports "feeling better but still short of breath with walking". NAD  Objective: Vital signs Filed Vitals:   04/18/12 0505 04/18/12 0930 04/18/12 0932 04/18/12 0933  BP: 107/68 98/63 122/68 141/75  Pulse: 92     Temp: 99.5 F (37.5 C)     TempSrc: Oral     Resp: 21     Height:      Weight: 156.9 kg (345 lb 14.4 oz)     SpO2: 92%      Weight change:  Last BM Date: 04/17/12  Intake/Output from previous day:   Total I/O In: 360 [P.O.:360] Out: -    Physical Exam: General: Alert, awake, oriented x3, in no acute distress. Obese HEENT: No bruits, no goiter. PERRL mucus membranes mouth slightly dry/pink Heart: Regular rate and rhythm, without murmurs, rubs, gallops. Lungs: Clear to auscultation bilaterally. Abdomen:Obese, slightly firm,  nontender, nondistended, positive bowel sounds. Extremities: No clubbing cyanosis or edema with positive pedal pulses. Chronic venous stasis changes LE. No pitting edema Neuro: Grossly intact, nonfocal. Cranial nerve II-XII intact.     Lab Results: Basic Metabolic Panel:  Basename 04/18/12 0510 04/17/12 2015  NA 139 139  K 3.5 3.8  CL 100 99  CO2 28 25  GLUCOSE 192* 246*  BUN 19 20  CREATININE 1.76* 1.71*  CALCIUM 8.4 8.7  MG -- --  PHOS -- --   Liver Function Tests:  [facility] 04/17/12 2015  AST 34  ALT 20  ALKPHOS 60  BILITOT 0.6  PROT 7.3  ALBUMIN 3.6    Basename 04/17/12 2015  LIPASE 15  AMYLASE --   No results found for this basename: AMMONIA:2 in the last 72 hours CBC:  Basename 04/18/12 0510 04/17/12 2015  WBC 6.7 7.7  NEUTROABS -- 5.5  HGB 13.8 15.3  HCT 41.5 45.0  MCV 84.5 83.6  PLT 177 183   Cardiac Enzymes:  Basename 04/18/12 0645 04/18/12 0010 04/17/12 2015  CKTOTAL 97 111 130  CKMB 2.2 2.6 3.0  CKMBINDEX -- -- --  TROPONINI <0.30 0.37* 0.33*   BNP:  Basename 04/17/12 2015  PROBNP 11286.0*   D-Dimer: No results found for this basename: DDIMER:2 in the  last 72 hours CBG:  Basename 04/18/12 0748 04/18/12 0508 04/17/12 1846 04/17/12 1811  GLUCAP 158* 179* 228* 324*   Hemoglobin A1C: No results found for this basename: HGBA1C in the last 72 hours Fasting Lipid Panel: No results found for this basename: CHOL,HDL,LDLCALC,TRIG,CHOLHDL,LDLDIRECT in the last 72 hours Thyroid Function Tests: No results found for this basename: TSH,T4TOTAL,FREET4,T3FREE,THYROIDAB in the last 72 hours Anemia Panel: No results found for this basename: VITAMINB12,FOLATE,FERRITIN,TIBC,IRON,RETICCTPCT in the last 72 hours Coagulation:  Basename 04/17/12 2015  LABPROT 14.0  INR 1.06   Urine Drug Screen: Drugs of Abuse  No results found for this basename: labopia, cocainscrnur, labbenz, amphetmu, thcu, labbarb    Alcohol Level: No results found for this basename: ETH:2 in the last 72 hours Urinalysis: No results found for this basename: COLORURINE:2,APPERANCEUR:2,LABSPEC:2,PHURINE:2,GLUCOSEU:2,HGBUR:2,BILIRUBINUR:2,KETONESUR:2,PROTEINUR:2,UROBILINOGEN:2,NITRITE:2,LEUKOCYTESUR:2 in the last 72 hours Misc. Labs:  No results found for this or any previous visit (from the past 240 hour(s)).  Studies/Results: Dg Chest Portable 1 View  04/17/2012  *RADIOLOGY REPORT*  Clinical Data: Dizziness.  Near-syncope.  Short of breath. Vomiting.  PORTABLE CHEST - 1 VIEW  Comparison: 01/18/2012  Findings: Cardiomegaly stable.  Both lungs are clear.  No evidence of pleural effusion.  No mass or lymphadenopathy identified.  IMPRESSION: Stable cardiomegaly.  No active lung  disease.  Original Report Authenticated By: Danae Orleans, M.D.    Medications: Scheduled Meds:   . amLODipine  2.5 mg Oral Daily  . aspirin EC  325 mg Oral Daily  . atorvastatin  20 mg Oral q1800  . benazepril  10 mg Oral Daily  . calcitRIOL  0.25 mcg Oral Q breakfast  . carvedilol  25 mg Oral BID WC  . docusate sodium  100 mg Oral BID  . gabapentin  300 mg Oral BID  . heparin  3,500 Units Intravenous  Once  . heparin  4,000 Units Intravenous Once  . hydroxychloroquine  200 mg Oral Daily  . insulin aspart  25-50 Units Subcutaneous TID WC  . insulin NPH  90 Units Subcutaneous BID  . lisinopril  10 mg Oral BID  . senna  1 tablet Oral BID  . sodium chloride  1,000 mL Intravenous Once  . sodium chloride  3 mL Intravenous Q12H  . DISCONTD: amlodipine-benazepril  1 capsule Oral Daily  . DISCONTD: heparin  5,000 Units Subcutaneous Q8H  . DISCONTD: simvastatin  40 mg Oral q1800   Continuous Infusions:   . sodium chloride 125 mL/hr at 04/18/12 0110  . heparin 1,400 Units/hr (04/18/12 0743)  . DISCONTD: heparin 1,000 Units/hr (04/17/12 2203)   PRN Meds:.acetaminophen, acetaminophen, albuterol-ipratropium, morphine, ondansetron (ZOFRAN) IV, ondansetron (ZOFRAN) IV, ondansetron  Assessment/Plan:  Principal Problem:  *Syncope and collapse Active Problems:  Diabetes mellitus  Dehydration  Chronic diarrhea  Nausea and vomiting  Elevated troponin  Abnormal ECG  Chronic renal insufficiency 1. Minimally positive Troponin, negative CK/MB, and abnormal ECG with newly inverted anterior T waves, acute elevated BNP on admission. Of note, his CXR does not show edema, and he remains somewhat hypovolemic by exam and by history. CE trending downward. Likely a demand/strain type minimally ischemic  Myocardium. Possibly from strain of dehydration, and not an acute MI from thrombus/plaque rupture. Repeat EKG pending as is Echo. Will discontinue heparin, continue ASA 325 and home lotrel, lisinopril, coreg, statin   2. Syncope: Likely dehydration and possibly a bit vagal. Cardiac event considered as above, but doesn't seem as likely. IVF's for some reason not running at ordered rate of 125 but at kvo. Pt not orthostatic today but was yesterday. BP somewhat on soft side. Will provide fluids at 125 for 8 hours, provide parameters for antihypertensives, hold home lasix.  3. Dehydration, chronic nausea, vomiting,  and diarrhea: His GI issues are chronic and as discussed may not necessarily need inpatient w/u, but as also recommend, I would refer to GI on OP basis. No N/V or diarrhea currently.   4. Acute vs chronic renal insufficiency: Baseline Cr a little hard to determine but it  seems he was 0.9-1.0 in 10/2011 but since 12/2011 he has not been less then 1.3-1.4 and as  high as 2.11. So, current Cr 1.7 is hard for me to say if acute or chronic.  Continue home rocaltrol . Provide IV fluids. monitor 5. DM and current hyperglycemia: Continue NPH and SSI. Will check A1c.   6. Chronic CHF (diastolic): See #1 and # and #3. Echo 10/12 yields EF 60-65%. Will hold lasix given above problem. Strict I&O's daily weights. Seems a little dry currently therefore will  Hydrate gently and monitor closely. Echo pending. Remains SOB with exertion.          LOS: 1 day   [facility] M 04/18/2012, 11:24 AM

## 2012-04-18 NOTE — Progress Notes (Signed)
   CARE MANAGEMENT NOTE 04/18/2012  Patient:  CLEVEN, JANSMA   Account Number:  [id number]  Date Initiated:  04/18/2012  Documentation initiated by:  Lanier Clam  Subjective/Objective Assessment:   ADMITTED W/SYNCOPE     Action/Plan:   FROM HOME ALONE   Anticipated DC Date:  04/21/2012   Anticipated DC Plan:  HOME/SELF CARE         Choice offered to / List presented to:             Status of service:  In process, will continue to follow Medicare Important Message given?   (If response is "NO", the following Medicare IM given date fields will be blank) Date Medicare IM given:   Date Additional Medicare IM given:    Discharge Disposition:    Per UR Regulation:  Reviewed for med. necessity/level of care/duration of stay  If discussed at Long Length of Stay Meetings, dates discussed:    Comments:  04/18/12 [facility] RN,BSN NCM [phone]

## 2012-04-18 NOTE — Evaluation (Signed)
Physical Therapy Evaluation Patient Details Name: Mitchell Parker. MRN: [id number] DOB: 12-Jul-1951 Today's Date: 04/18/2012 Time: 0900-0920 PT Time Calculation (min): 20 min  PT Assessment / Plan / Recommendation Clinical Impression  Pt presents with diagnosis of syncope and collapse. Pt not requiring much physical assistance but demonstrates poor activity tolerance and significant SOB with activity. Pt will benefit from skilled PT to maximize independence and activity tolerance in preparation for d/c     PT Assessment  Patient needs continued PT services    Follow Up Recommendations  Home health PT    Equipment Recommendations  None recommended by PT    Frequency Min 3X/week    Precautions / Restrictions Precautions Precautions: Fall Restrictions Weight Bearing Restrictions: No   Pertinent Vitals/Pain BP: supine-98/65, sitting-106/70, standing-102/69. After walking-141/62.      Mobility  Bed Mobility Bed Mobility: Supine to Sit Supine to Sit: 4: Min assist;HOB elevated;With rails Details for Bed Mobility Assistance: Assist for trunk to upright. Increased time.  Transfers Transfers: Sit to Stand;Stand to Sit Sit to Stand: 4: Min guard Stand to Sit: 4: Min guard Details for Transfer Assistance: VCs safety.  Ambulation/Gait Ambulation/Gait Assistance: 1: +2 Total assist Ambulation/Gait: Patient Percentage: 90 Ambulation Distance (Feet): 30 Feet Assistive device: Rolling walker Ambulation/Gait Assistance Details: VCs safety. +2 safety, equipment, recliner follow. Fatigues very easily. Dyspnea 4/4 with short distance ambulation. Pt c/o slight lightheadedness.  Gait Pattern: Step-through pattern    Exercises     PT Goals Acute Rehab PT Goals PT Goal Formulation: With patient Time For Goal Achievement: 05/02/12 Potential to Achieve Goals: Good Pt will go Supine/Side to Sit: with modified independence PT Goal: Supine/Side to Sit - Progress: Goal set today Pt will go  Sit to Supine/Side: with modified independence PT Goal: Sit to Supine/Side - Progress: Goal set today Pt will go Sit to Stand: with modified independence PT Goal: Sit to Stand - Progress: Goal set today Pt will Ambulate: 51 - 150 feet;with modified independence;with least restrictive assistive device PT Goal: Ambulate - Progress: Goal set today Pt will Go Up / Down Stairs: 6-9 stairs;with modified independence;with rail(s) PT Goal: Up/Down Stairs - Progress: Goal set today  Visit Information  Last PT Received On: 04/18/12 Assistance Needed: +2    Subjective Data  Subjective: "They told me not to get up without help" Patient Stated Goal: Get better and get back home   Prior Functioning  Home Living Lives With: Spouse Available Help at Discharge: Family Type of Home: Mobile home Home Access: Stairs to enter Entrance Stairs-Number of Steps: 7 Entrance Stairs-Rails: Right;Left Home Layout: One level Home Adaptive Equipment: Walker - rolling Prior Function Level of Independence: Independent Able to Take Stairs?: Yes Driving: Yes Communication Communication: No difficulties    Cognition  Overall Cognitive Status: Appears within functional limits for tasks assessed/performed Arousal/Alertness: Awake/alert Orientation Level: Appears intact for tasks assessed Behavior During Session: [facility] for tasks performed    Extremity/Trunk Assessment Right Lower Extremity Assessment RLE ROM/Strength/Tone: Within functional levels RLE Coordination: WFL - gross/fine motor Left Lower Extremity Assessment LLE ROM/Strength/Tone: Within functional levels LLE Coordination: WFL - gross/fine motor   Balance    End of Session PT - End of Session Equipment Utilized During Treatment: Gait belt Activity Tolerance: Patient limited by fatigue Patient left: in chair;with call bell/phone within reach;with family/visitor present Nurse Communication: Mobility status   Rebeca Alert [facility] 04/18/2012,  10:00 AM [phone]

## 2012-04-18 NOTE — Progress Notes (Signed)
ANTICOAGULATION CONSULT NOTE - Follow up Consult  Pharmacy Consult for Heparin Indication: ACS/STEMI  Allergies  Allergen Reactions  . Ciprofloxacin Hives  . Levofloxacin (Levaquin) Hives    Patient Measurements: Height: 6' 0.83" (185 cm) Weight: 345 lb 14.4 oz (156.9 kg) (bed scale) IBW/kg (Calculated) : 79.52  Heparin Dosing Weight: 116.1 kg  Vital Signs: Temp: 99.5 F (37.5 C) (04/24 0505) Temp src: Oral (04/24 0505) BP: 107/68 mmHg (04/24 0505) Pulse Rate: 92  (04/24 0505)  Labs:  Mitchell Parker 04/18/12 0645 04/18/12 0510 04/18/12 0010 04/17/12 2015  HGB -- 13.8 -- 15.3  HCT -- 41.5 -- 45.0  PLT -- 177 -- 183  APTT -- -- -- 32  LABPROT -- -- -- 14.0  INR -- -- -- 1.06  HEPARINUNFRC <0.10* -- -- --  CREATININE -- 1.76* -- 1.71*  CKTOTAL PENDING -- 111 130  CKMB 2.2 -- 2.6 3.0  TROPONINI <0.30 -- 0.37* 0.33*   Estimated Creatinine Clearance: 71.5 ml/min (by C-G formula based on Cr of 1.76).  Medical History: Past Medical History  Diagnosis Date  . CHF (congestive heart failure)     H/o nonischemic CMP with EF 17% in 2003, however echo 09/2011 with EF 60-65%. Not ischemic by cath, no MI, stents, etc  . Diabetes mellitus     On insulin  . Neuropathy   . Hypertension   . COPD (chronic obstructive pulmonary disease)   . Gallstones   . RA (rheumatoid arthritis)   . Obese   . Kidney stones   . Piriformis syndrome     left hip  . Lumbar disc disease     compression   . Sleep apnea   . Gout     Medications:  Scheduled:     . amLODipine  2.5 mg Oral Daily  . aspirin EC  325 mg Oral Daily  . benazepril  10 mg Oral Daily  . calcitRIOL  0.25 mcg Oral Q breakfast  . carvedilol  25 mg Oral BID WC  . docusate sodium  100 mg Oral BID  . gabapentin  300 mg Oral BID  . heparin  4,000 Units Intravenous Once  . hydroxychloroquine  200 mg Oral Daily  . insulin aspart  25-50 Units Subcutaneous TID WC  . insulin NPH  90 Units Subcutaneous BID  . lisinopril  10 mg  Oral BID  . senna  1 tablet Oral BID  . simvastatin  40 mg Oral q1800  . sodium chloride  1,000 mL Intravenous Once  . sodium chloride  3 mL Intravenous Q12H  . DISCONTD: amlodipine-benazepril  1 capsule Oral Daily  . DISCONTD: heparin  5,000 Units Subcutaneous Q8H   Infusions:     . sodium chloride 125 mL/hr at 04/18/12 0110  . heparin 1,000 Units/hr (04/17/12 2203)    Assessment:  61 year old male presents with complaint of light headedness, nausea/vomiting, near syncopal episode.  IV heparin to started for ACS/STEMI  Elevated troponin:  0.33, 0.37  CBC is stable  First heparin level is low   Goal of Therapy:  Heparin level 0.3-0.7 units/ml   Plan:   Heparin 3500 unit IV bolus x 1  Increase Heparin drip to 1400 units/hr  Check heparin level 6 hours after heparin started  Check daily heparin level & CBC   Lynann Beaver PharmD, BCPS Pager [phone] 04/18/2012 7:22 AM

## 2012-04-18 NOTE — H&P (Addendum)
PCP:  HASANAJ,XAJE A, MD, MD  Confirmed with pt  CARDIOLOGIST Dr. Adella Hare in [facility] Pt also sees a Rheumatologist   Chief Complaint:  Syncope, diarrhea, nausea and vomiting  HPI: 60yoM with numerous medical problems including non-ischemic CHF but now with recovered EF  60-65%, COPD, OSA/CPAP, morbid obesity, DM/insulin presents with 41yr h/o diarrhea, nausea,  vomiting, dehydration, and sycnopal episode and found to have minimally elevated Trop,  acutely elevated BNP, and abnormal ECG.   Pt is great historian, states he's had 1 year of diarrhea, described as foul-smelling,  yellow, liquidy but also with some solid, up to 3-4 times per day. It will come on for 3-4  days then go away for 3-4 days. Also with chronic nausea and vomiting 1-2 times per day.  He was last admitted to [facility] 12/2011 with a one year h/o cholelithiasis, had scheduled an  elective CCY but pain worsened so he came in acutely and was admitted, had lap chole, and  was advised to f/u with GI, decision made to defer MRCP and only if recurrent pain. He  states he's had persistent symptoms since this surgery.   Yesterday he was mowing the lawn, finished and started having nausea, diarrhea. Went to  pick his wife downtown [place], and when got back to home, when getting out the truck, he  had sudden onset dizziness described more like presyncope, and acute SOB. He tried to walk  it off, but when he got the top of the stairs, he dropped his keys, so bent over to pick  them up. Next thing, he was waking up on the porch with his wife putting a cool rag on his  head. He knew where he was immediately. No associated chest pain. He feels very  dehydrated.   In the ED, Tmax was 99.4, orthostatics moderately positive from 129/69 p95 lying to 139/77  p103 sitting, to 115/66 p105 standing. Labs with normal chem panel except renal 20/1.71  which is within his baseline, and hyperglycemia 200-300, normal LFT's, minimally positive    Trop at 0.33 but negative CK and MB, BNP >11,000 which is way higher than before, normal  CBC. CXR with cardiomegaly that is stable and no active lung disease.   Pt denies any chest pain at all, neither now nor chronically. Cath 2003 reportedly with EF  20%, thought non-ischemic, then last cath 2008 reportedly normal per pt, not in our  system. He denies any stents, MI's, CABG. Echo in [facility] 09/2011 shows EF 60-65% with grade  1 diastolic dysfxn, some ventricular flattening and some interventricular dependence on  respirations (see report below).   Past Medical History  Diagnosis Date  . CHF (congestive heart failure)     H/o nonischemic CMP with EF 17% in 2003, however echo 09/2011 with EF 60-65%. Not ischemic by cath, no MI, stents, etc  . Diabetes mellitus     On insulin  . Neuropathy   . Hypertension   . COPD (chronic obstructive pulmonary disease)   . Gallstones   . RA (rheumatoid arthritis)   . Obese   . Kidney stones   . Piriformis syndrome     left hip  . Lumbar disc disease     compression   . Sleep apnea   . Gout     Past Surgical History  Procedure Date  . Umbilical hernia repair   . Pilonidal cyst excision 1997  . Varicose vein surgery     left leg  . Cholecystectomy 01/18/2012  Procedure: LAPAROSCOPIC CHOLECYSTECTOMY WITH INTRAOPERATIVE CHOLANGIOGRAM;  Surgeon: Clovis Pu. Cornett, MD;  Location: [facility];  Service: General;  Laterality: N/A;    Medications:  HOME MEDS: Reconciled by name with pt  Prior to Admission medications   Medication Sig Start Date End Date Taking? Authorizing Provider  albuterol-ipratropium (COMBIVENT) 18-103 MCG/ACT inhaler Inhale 2 puffs into the lungs every 6 (six) hours as needed. Wheezing   Yes Historical Provider, MD  amlodipine-benazepril (LOTREL) 2.5-10 MG per capsule Take 1 capsule by mouth daily.   Yes Historical Provider, MD  aspirin 81 MG tablet Take 81 mg by mouth 2 (two) times daily.    Yes Historical Provider, MD   calcitRIOL (ROCALTROL) 0.25 MCG capsule Take 0.25 mcg by mouth daily with breakfast.    Yes Historical Provider, MD  carvedilol (COREG) 25 MG tablet Take 25 mg by mouth 2 (two) times daily with a meal.     Yes Historical Provider, MD  furosemide (LASIX) 80 MG tablet Take 80 mg by mouth 2 (two) times daily.     Yes Historical Provider, MD  gabapentin (NEURONTIN) 300 MG capsule Take 300 mg by mouth 2 (two) times daily.     Yes Historical Provider, MD  HYDROcodone-acetaminophen (NORCO) 7.5-325 MG per tablet Take 1 tablet by mouth every 8 (eight) hours as needed for pain. Pain 01/21/12  Yes Dorothea Ogle, MD  HYDROXYCHLOROQUINE SULFATE PO Take 300 mg by mouth 2 (two) times daily.   Yes Historical Provider, MD  insulin aspart (NOVOLOG) 100 UNIT/ML injection Inject 50 Units into the skin 3 (three) times daily before meals.   Yes Historical Provider, MD  insulin NPH (HUMULIN N,NOVOLIN N) 100 UNIT/ML injection Inject 90 Units into the skin 2 (two) times daily.     Yes Historical Provider, MD  lisinopril (PRINIVIL,ZESTRIL) 10 MG tablet Take 10 mg by mouth 2 (two) times daily. Take 2 tablets in the morning and 1 tablet in the evening   Yes Historical Provider, MD  omeprazole (PRILOSEC) 20 MG capsule Take 20 mg by mouth every morning.     Yes Historical Provider, MD  pravastatin (PRAVACHOL) 80 MG tablet Take 80 mg by mouth at bedtime.     Yes Historical Provider, MD  traMADol (ULTRAM) 50 MG tablet Take 50 mg by mouth every 8 (eight) hours as needed. Maximum dose= 8 tablets per day. Pain    Yes Historical Provider, MD    Allergies:  Allergies  Allergen Reactions  . Ciprofloxacin Hives  . Levofloxacin (Levaquin) Hives    Social History:   reports that he quit smoking about 17 years ago. He has never used smokeless tobacco. He reports that he does not drink alcohol or use illicit drugs. Lives at home with wife, and has two adult children. Still ambulatory, no cane or walker.   Family History: Family  History  Problem Relation Age of Onset  . Hypertension Mother   . Diabetes Father   . Diabetes Brother     Physical Exam: Filed Vitals:   04/17/12 2043 04/17/12 2055 04/17/12 2057 04/17/12 2059  BP:  129/69 139/77 115/66  Pulse:  95 103 105  Temp: 99.4 F (37.4 C)     TempSrc:      Resp:      Height:    6' 0.83" (1.85 m)  Weight:      SpO2:       Blood pressure 115/66, pulse 105, temperature 99.4 F (37.4 C), temperature source Oral, resp. rate 19, height  6' 0.84" (1.85 m), weight 155.187 kg (342 lb 2 oz), SpO2 92.00%.  Gen: Very obese M in ED stretcher, mostly calm and pleasant through interview however at  the end gets very nauseous and distressed from nausea. He is grossly dry appearing. Not  toxic, nice, able to relate history well, good historian. Breathing comfortably HEENT: Pupils round, reactive EOMI, sclera clear and normal, wearing glasses. Mouth,  tongue, and lips look frankly parched and tongue has blotchy mottled appearance Lungs: CTAB no w/c/r, good air movement, no crackles, overall good exam Heart: regular, S1/2 quite hard to hear, but no obvious adventitious heard sounds Abd: Very obese and tight, but not peritoneal or rigid, no facial grimacing to plapation Extrem: Increased bulkiness and normal tone, warm hands and feet, radials pretty hard to  palpate but can eventually find them. There are chronic hyperpigmented venous stasis  changes in his BLE's with almost developing ulcers, but no soft pitting edema, it's more  hard induration Neuro: Alert, attentive conversant, CN 2-12 itnact without deficits, moves extremities on  his own, grossly nonfocal.   Labs & Imaging Results for orders placed during the hospital encounter of 04/17/12 (from the past 48 hour(s))  GLUCOSE, CAPILLARY     Status: Abnormal   Collection Time   04/17/12  6:11 PM      Component Value Range Comment   Glucose-Capillary 324 (*) 70 - 99 (mg/dL)   GLUCOSE, CAPILLARY     Status: Abnormal    Collection Time   04/17/12  6:46 PM      Component Value Range Comment   Glucose-Capillary 228 (*) 70 - 99 (mg/dL)    Comment 1 Documented in Chart      Comment 2 Notify RN     CBC     Status: Normal   Collection Time   04/17/12  8:15 PM      Component Value Range Comment   WBC 7.7  4.0 - 10.5 (K/uL)    RBC 5.38  4.22 - 5.81 (MIL/uL)    Hemoglobin 15.3  13.0 - 17.0 (g/dL)    HCT 16.1  09.6 - 04.5 (%)    MCV 83.6  78.0 - 100.0 (fL)    MCH 28.4  26.0 - 34.0 (pg)    MCHC 34.0  30.0 - 36.0 (g/dL)    RDW 40.9  81.1 - 91.4 (%)    Platelets 183  150 - 400 (K/uL)   DIFFERENTIAL     Status: Normal   Collection Time   04/17/12  8:15 PM      Component Value Range Comment   Neutrophils Relative 72  43 - 77 (%)    Neutro Abs 5.5  1.7 - 7.7 (K/uL)    Lymphocytes Relative 16  12 - 46 (%)    Lymphs Abs 1.2  0.7 - 4.0 (K/uL)    Monocytes Relative 10  3 - 12 (%)    Monocytes Absolute 0.8  0.1 - 1.0 (K/uL)    Eosinophils Relative 1  0 - 5 (%)    Eosinophils Absolute 0.1  0.0 - 0.7 (K/uL)    Basophils Relative 1  0 - 1 (%)    Basophils Absolute 0.0  0.0 - 0.1 (K/uL)   COMPREHENSIVE METABOLIC PANEL     Status: Abnormal   Collection Time   04/17/12  8:15 PM      Component Value Range Comment   Sodium 139  135 - 145 (mEq/L)    Potassium 3.8  3.5 -  5.1 (mEq/L)    Chloride 99  96 - 112 (mEq/L)    CO2 25  19 - 32 (mEq/L)    Glucose, Bld 246 (*) 70 - 99 (mg/dL)    BUN 20  6 - 23 (mg/dL)    Creatinine, Ser 1.61 (*) 0.50 - 1.35 (mg/dL)    Calcium 8.7  8.4 - 10.5 (mg/dL)    Total Protein 7.3  6.0 - 8.3 (g/dL)    Albumin 3.6  3.5 - 5.2 (g/dL)    AST 34  0 - 37 (U/L) NO VISIBLE HEMOLYSIS   ALT 20  0 - 53 (U/L)    Alkaline Phosphatase 60  39 - 117 (U/L)    Total Bilirubin 0.6  0.3 - 1.2 (mg/dL)    GFR calc non Af Amer 42 (*) >90 (mL/min)    GFR calc Af Amer 49 (*) >90 (mL/min)   LIPASE, BLOOD     Status: Normal   Collection Time   04/17/12  8:15 PM      Component Value Range Comment   Lipase 15   11 - 59 (U/L)   CARDIAC PANEL(CRET KIN+CKTOT+MB+TROPI)     Status: Abnormal   Collection Time   04/17/12  8:15 PM      Component Value Range Comment   Total CK 130  7 - 232 (U/L)    CK, MB 3.0  0.3 - 4.0 (ng/mL)    Troponin I 0.33 (*) <0.30 (ng/mL)    Relative Index 2.3  0.0 - 2.5    PRO B NATRIURETIC PEPTIDE     Status: Abnormal   Collection Time   04/17/12  8:15 PM      Component Value Range Comment   Pro B Natriuretic peptide (BNP) 11286.0 (*) 0 - 125 (pg/mL)   APTT     Status: Normal   Collection Time   04/17/12  8:15 PM      Component Value Range Comment   aPTT 32  24 - 37 (seconds)   PROTIME-INR     Status: Normal   Collection Time   04/17/12  8:15 PM      Component Value Range Comment   Prothrombin Time 14.0  11.6 - 15.2 (seconds)    INR 1.06  0.00 - 1.49     Dg Chest Portable 1 View  04/17/2012  *RADIOLOGY REPORT*  Clinical Data: Dizziness.  Near-syncope.  Short of breath. Vomiting.  PORTABLE CHEST - 1 VIEW  Comparison: 01/18/2012  Findings: Cardiomegaly stable.  Both lungs are clear.  No evidence of pleural effusion.  No mass or lymphadenopathy identified.  IMPRESSION: Stable cardiomegaly.  No active lung disease.  Original Report Authenticated By: Danae Orleans, M.D.    12/2010: Study Conclusions - Left ventricle: The cavity size was normal. Wall thickness   was increased in a pattern of mild LVH. Systolic function   was normal. The estimated ejection fraction was in the   range of 60% to 65%. Doppler parameters are consistent   with abnormal left ventricular relaxation (grade 1   diastolic dysfunction). - Ventricular septum: The contour showed diastolic   flattening and systolic flattening. Also intermittent   septal bounce and suggestion of some interventricular   dependance by respiratory change in mitral and tricuspid   inflow - question if related to lung disease or   constriction. - Aortic valve: Trileaflet; mildly calcified leaflets. - Mitral valve: Mild  regurgitation. - Tricuspid valve: Mild regurgitation. - Pulmonary arteries: PA peak pressure: 48mm Hg (  S). - Pericardium, extracardiac: A trivial pericardial effusion   was identified.   ECG: NSR 97 bpm, RAD, normal P and PR, no Q waves, narrow QRS, no ST deviations,  flattish/invertedish T wave in III, aVF, inverted T waves in V1-4. Compared to prior in  12/2011, the inferior T waves are possibly a bit more flat/inverted, but the anterior T  waves are frankly inverted.    Impression Present on Admission:  .Syncope and collapse .Dehydration .Chronic diarrhea .Nausea and vomiting .Elevated troponin .Abnormal ECG .Chronic renal insufficiency .Diabetes mellitus  60yoM with numerous medical problems including non-ischemic CHF but now with recovered EF  60-65%, COPD, OSA/CPAP, morbid obesity, DM/insulin presents with 30yr h/o diarrhea, nausea,  vomiting, dehydration, and sycnopal episode and found to have minimally elevated Trop,  acutely elevated BNP, and abnormal ECG.   1. Minimally positive Troponin, negative CK/MB, and abnormal ECG with newly inverted  anterior T waves, acute elevated BNP: Of note, his CXR does not show edema, and he is  frankly hypovolemic by exam and by history. He is not having any present angina nor is  this a chronic problem. Overall I suspect this is a demand/strain type minimally ischemic  myocardium possibly from strain of dehydration, and not an acute MI from thrombus/plaque  rupture. Therefore, while I don't feel the heparin drip that's started by ED is wholly  needed at this point, not unreasonable to continue given unclear which way his enzymes  will trend. But if stable, rules out, would stop. Have spoken to cardiology who recommend  echo in am, trend enzymes, call in am if needed.   - Echo, trend cardiac enzymes, ECG, continue heparin for now, ASA 325 - Continue home lotrel, lisinopril, coreg, statin  2. Syncope: I suspect this is dehydration and  possibly a bit vagal. Cardiac event  considered as above, but doesn't seem as likely, esp given symptoms were >24 hrs ago and  enzymes basically unimpressive.  - IVF's, daily orthostatics. Although a bit positive in ED, not floridly hypotense and  will continue antiHTN meds since they are likely more needed   3. Dehydration, chronic nausea, vomiting, and diarrhea: His GI issues are chronic and as  discussed may not necessarily need inpatient w/u, but as also recommend, I would refer to  GI for his >1 yr of diarrhea, for testing for fecal fat, fecal leukocytes, colon/EGD, etc.  But as I told them, we will address cardiac and dehydration and this could maybe be  deferred.  - Holding lasix   4. Acute vs chronic renal insufficiency: Baseline Cr a little hard to determine but it  seems he was 0.9-1.0 in 10/2011 but since 12/2011 he has not been less then 1.3-1.4 and as  high as 2.11. So, current Cr 1.7 is hard for me to say if acute or chronic.  - Continue home rocaltrol  5. DM and current hyperglycemia: Continue NPH as is, but will write for more aggressive  SSI, he says he does do SSI and takes a maximum of 50u with meals, but can't give me anything more precise  6. Continue home plaquenil, gabapentin  SubQ heparin Telemetry, team 6 Full code, discussed with pt and wife  Other plans as per orders.    Lihanna Biever 04/18/2012, 12:04 AM

## 2012-04-19 DIAGNOSIS — I517 Cardiomegaly: Secondary | ICD-10-CM

## 2012-04-19 DIAGNOSIS — I82409 Acute embolism and thrombosis of unspecified deep veins of unspecified lower extremity: Secondary | ICD-10-CM

## 2012-04-19 DIAGNOSIS — I2699 Other pulmonary embolism without acute cor pulmonale: Secondary | ICD-10-CM | POA: Diagnosis present

## 2012-04-19 DIAGNOSIS — Z86718 Personal history of other venous thrombosis and embolism: Secondary | ICD-10-CM

## 2012-04-19 DIAGNOSIS — M7989 Other specified soft tissue disorders: Secondary | ICD-10-CM

## 2012-04-19 HISTORY — DX: Personal history of other venous thrombosis and embolism: Z86.718

## 2012-04-19 HISTORY — DX: Acute embolism and thrombosis of unspecified deep veins of unspecified lower extremity: I82.409

## 2012-04-19 LAB — GLUCOSE, CAPILLARY
Glucose-Capillary: 66 mg/dL — ABNORMAL LOW (ref 70–99)
Glucose-Capillary: 92 mg/dL (ref 70–99)
Glucose-Capillary: 94 mg/dL (ref 70–99)
Glucose-Capillary: 65 mg/dL — ABNORMAL LOW (ref 70–99)
Glucose-Capillary: 74 mg/dL (ref 70–99)
Glucose-Capillary: 154 mg/dL — ABNORMAL HIGH (ref 70–99)
Glucose-Capillary: 93 mg/dL (ref 70–99)
Glucose-Capillary: 90 mg/dL (ref 70–99)

## 2012-04-19 LAB — CBC
HCT: 39.2 % (ref 39.0–52.0)
Hemoglobin: 12.7 g/dL — ABNORMAL LOW (ref 13.0–17.0)
MCH: 27.6 pg (ref 26.0–34.0)
MCHC: 32.4 g/dL (ref 30.0–36.0)
MCV: 85.2 fL (ref 78.0–100.0)
Platelets: 160 10*3/uL (ref 150–400)
RBC: 4.6 MIL/uL (ref 4.22–5.81)
RDW: 15.6 % — ABNORMAL HIGH (ref 11.5–15.5)
WBC: 6.5 10*3/uL (ref 4.0–10.5)

## 2012-04-19 LAB — PROTIME-INR
INR: 0.96 (ref 0.00–1.49)
Prothrombin Time: 13 seconds (ref 11.6–15.2)

## 2012-04-19 LAB — URINALYSIS, ROUTINE W REFLEX MICROSCOPIC
Glucose, UA: NEGATIVE mg/dL
Nitrite: NEGATIVE
Protein, ur: NEGATIVE mg/dL
Specific Gravity, Urine: 1.022 (ref 1.005–1.030)
Urobilinogen, UA: 1 mg/dL (ref 0.0–1.0)
pH: 6 (ref 5.0–8.0)

## 2012-04-19 LAB — BASIC METABOLIC PANEL
BUN: 25 mg/dL — ABNORMAL HIGH (ref 6–23)
CO2: 29 mEq/L (ref 19–32)
Calcium: 7.9 mg/dL — ABNORMAL LOW (ref 8.4–10.5)
Chloride: 101 mEq/L (ref 96–112)
Creatinine, Ser: 2.23 mg/dL — ABNORMAL HIGH (ref 0.50–1.35)
GFR calc Af Amer: 36 mL/min — ABNORMAL LOW (ref >90)
GFR calc non Af Amer: 31 mL/min — ABNORMAL LOW (ref >90)
Glucose, Bld: 74 mg/dL (ref 70–99)
Potassium: 3.5 mEq/L (ref 3.5–5.1)
Sodium: 137 mEq/L (ref 135–145)

## 2012-04-19 LAB — HEPARIN ANTI-XA: Heparin LMW: 1.36 IU/mL

## 2012-04-19 LAB — URINE MICROSCOPIC-ADD ON

## 2012-04-19 LAB — URIC ACID: Uric Acid, Serum: 12.6 mg/dL — ABNORMAL HIGH (ref 4.0–7.8)

## 2012-04-19 MED ORDER — PREDNISONE 20 MG PO TABS
40.0000 mg | ORAL_TABLET | Freq: Two times a day (BID) | ORAL | Status: DC
  Administered 2012-04-19 – 2012-04-23 (×8): 40 mg via ORAL
  Filled 2012-04-19 (×10): qty 2

## 2012-04-19 MED ORDER — SODIUM CHLORIDE 0.9 % IV BOLUS (SEPSIS)
500.0000 mL | Freq: Once | INTRAVENOUS | Status: AC
  Administered 2012-04-19: 500 mL via INTRAVENOUS

## 2012-04-19 MED ORDER — ENOXAPARIN SODIUM 150 MG/ML ~~LOC~~ SOLN
150.0000 mg | Freq: Two times a day (BID) | SUBCUTANEOUS | Status: DC
Start: 2012-04-20 — End: 2012-04-20
  Filled 2012-04-19: qty 1

## 2012-04-19 MED ORDER — CEFUROXIME AXETIL 250 MG/5ML PO SUSR
250.0000 mg | Freq: Two times a day (BID) | ORAL | Status: DC
  Filled 2012-04-19 (×2): qty 5

## 2012-04-19 MED ORDER — DEXTROSE 5 % IV SOLN
1.0000 g | Freq: Once | INTRAVENOUS | Status: AC
  Administered 2012-04-19: 1 g via INTRAVENOUS
  Filled 2012-04-19: qty 10

## 2012-04-19 MED ORDER — COLCHICINE 0.6 MG PO TABS
0.6000 mg | ORAL_TABLET | Freq: Every day | ORAL | Status: DC
  Administered 2012-04-19 – 2012-04-24 (×6): 0.6 mg via ORAL
  Filled 2012-04-19 (×6): qty 1

## 2012-04-19 MED ORDER — WARFARIN SODIUM 10 MG PO TABS
10.0000 mg | ORAL_TABLET | Freq: Once | ORAL | Status: AC
  Administered 2012-04-19: 10 mg via ORAL
  Filled 2012-04-19: qty 1

## 2012-04-19 MED ORDER — COLCHICINE 0.6 MG PO TABS
0.6000 mg | ORAL_TABLET | ORAL | Status: AC
  Administered 2012-04-19 (×2): 0.6 mg via ORAL
  Filled 2012-04-19 (×3): qty 1

## 2012-04-19 MED ORDER — HYDROMORPHONE HCL PF 1 MG/ML IJ SOLN
1.0000 mg | INTRAMUSCULAR | Status: DC | PRN
  Administered 2012-04-19 – 2012-04-20 (×7): 1 mg via INTRAVENOUS
  Filled 2012-04-19 (×7): qty 1

## 2012-04-19 NOTE — Progress Notes (Addendum)
Subjective: Reports pain in left elbow "my gout always starts in the elbow". Still some short of breath. No chest pain.  Objective: Vital signs Filed Vitals:   04/19/12 0532 04/19/12 0533 04/19/12 0534 04/19/12 1325  BP: 109/70 106/64 125/75 126/37  Pulse: 72 83 86 61  Temp:    100.2 F (37.9 C)  TempSrc:    Oral  Resp:    20  Height:      Weight:      SpO2: 92% 95% 95% 95%   Weight change: 4.313 kg (9 lb 8.1 oz) Last BM Date: 04/18/12  Intake/Output from previous day: 04/24 0701 - 04/25 0700 In: 1325 [P.O.:1320; I.V.:3; IV Piggyback:2] Out: -  Total I/O In: 1100 [P.O.:600; IV Piggyback:500] Out: 200 [Urine:200]   Physical Exam: General: Alert, awake, oriented x3, somewhat uncomfortable. HEENT: No bruits, no goiter. Mucus membrane mouth moist/pink. perrl Heart: Regular rate and rhythm, without murmurs, rubs, gallops. Lungs: Normal effort . Breath sounds clear to auscultation bilaterally. Abdomen: Soft, nontender, nondistended, positive bowel sounds. Extremities: No clubbing cyanosis  with positive pedal pulses. Chronic venous stasis changes. Trace to 1+ UE edema Left Elbow shows redness, warmth, decreased ROM. Neuro: Grossly intact, nonfocal. Speech clear   Lab Results: Basic Metabolic Panel:  Basename 04/19/12 0523 04/18/12 0510  NA 137 139  K 3.5 3.5  CL 101 100  CO2 29 28  GLUCOSE 74 192*  BUN 25* 19  CREATININE 2.23* 1.76*  CALCIUM 7.9* 8.4  MG -- --  PHOS -- --   Liver Function Tests:  [facility] 04/17/12 2015  AST 34  ALT 20  ALKPHOS 60  BILITOT 0.6  PROT 7.3  ALBUMIN 3.6    Basename 04/17/12 2015  LIPASE 15  AMYLASE --   No results found for this basename: AMMONIA:2 in the last 72 hours CBC:  Basename 04/19/12 0523 04/18/12 0510 04/17/12 2015  WBC 6.5 6.7 --  NEUTROABS -- -- 5.5  HGB 12.7* 13.8 --  HCT 39.2 41.5 --  MCV 85.2 84.5 --  PLT 160 177 --   Cardiac Enzymes:  Basename 04/18/12 1614 04/18/12 0645 04/18/12 0010  CKTOTAL  84 97 111  CKMB 1.6 2.2 2.6  CKMBINDEX -- -- --  TROPONINI <0.30 <0.30 0.37*   BNP:  Basename 04/17/12 2015  PROBNP 11286.0*   D-Dimer: No results found for this basename: DDIMER:2 in the last 72 hours CBG:  Basename 04/19/12 0818 04/19/12 0751 04/18/12 2222 04/18/12 1731 04/18/12 1729 04/18/12 1236  GLUCAP 74 65* 94 92 66* 144*   Hemoglobin A1C:  Basename 04/18/12 0510  HGBA1C 7.9*   Coagulation:  Basename 04/19/12 0523 04/17/12 2015  LABPROT 13.0 14.0  INR 0.96 1.06    Studies/Results: Nm Pulmonary Per & Vent  04/18/2012  *RADIOLOGY REPORT*  Clinical Data:  61 year old male with shortness of breath.  NUCLEAR MEDICINE VENTILATION - PERFUSION LUNG SCAN  Technique:  Wash-in, equilibrium, and wash-out phase ventilation images were obtained using Xe-133 gas.  Perfusion images were obtained in multiple projections after intravenous injection of Tc- 87m MAA.  Radiopharmaceuticals:  3.3 mCi Xe-133 gas and 10.0 mCi Tc-53m MAA.  Comparison:  Portable chest radiograph 04/17/2012 at 2047 hours.  Findings: Comparison chest radiograph demonstrates cardiomegaly and mild vascular congestion.  Ventilation imaging demonstrates no ventilation defect.  Perfusion imaging demonstrates significant lack of perfusion at the right lung base.  Additional small moderate sized wedge shaped peripheral perfusion defects, more numerous in the right upper lobe but one or two small  lesions also present in the left upper lobe.  IMPRESSION: High probability of acute pulmonary embolus.  Critical Value/emergent results were called by telephone at the time of interpretation on 04/18/2012  at 1615 hours  to  Dr. Osvaldo Shipper, who verbally acknowledged these results.  Original Report Authenticated By: Harley Hallmark, M.D.   Dg Chest Portable 1 View  04/17/2012  *RADIOLOGY REPORT*  Clinical Data: Dizziness.  Near-syncope.  Short of breath. Vomiting.  PORTABLE CHEST - 1 VIEW  Comparison: 01/18/2012  Findings:  Cardiomegaly stable.  Both lungs are clear.  No evidence of pleural effusion.  No mass or lymphadenopathy identified.  IMPRESSION: Stable cardiomegaly.  No active lung disease.  Original Report Authenticated By: Danae Orleans, M.D.   2D ECHOCARDIOGRAM Study Conclusions  - Left ventricle: Wall thickness was increased in a pattern of mild LVH. Systolic function was normal. The estimated ejection fraction was in the range of 60% to 65%. - Pericardium, extracardiac: A trivial pericardial effusion was identified.   Medications: Scheduled Meds:    . amLODipine  2.5 mg Oral Daily  . aspirin EC  325 mg Oral Daily  . atorvastatin  20 mg Oral q1800  . calcitRIOL  0.25 mcg Oral Q breakfast  . carvedilol  25 mg Oral BID WC  . colchicine  0.6 mg Oral Daily  . colchicine  0.6 mg Oral Q1H  . enoxaparin (LOVENOX) injection  150 mg Subcutaneous Q12H  . gabapentin  300 mg Oral BID  . hydroxychloroquine  200 mg Oral Daily  . insulin aspart  25-50 Units Subcutaneous TID WC  . insulin NPH  90 Units Subcutaneous BID  . metoCLOPramide  5 mg Oral TID AC & HS  . patient's guide to using coumadin book   Does not apply Once  . senna  1 tablet Oral BID  . sodium chloride  500 mL Intravenous Once  . sodium chloride  3 mL Intravenous Q12H  . warfarin  10 mg Oral Once  . warfarin  10 mg Oral ONCE-1800  . warfarin   Does not apply Once  . Warfarin - Pharmacist Dosing Inpatient   Does not apply q1800  . DISCONTD: benazepril  10 mg Oral Daily  . DISCONTD: lisinopril  10 mg Oral BID   Continuous Infusions:    . sodium chloride 100 mL/hr at 04/18/12 1200   PRN Meds:.acetaminophen, acetaminophen, albuterol-ipratropium, HYDROmorphone (DILAUDID) injection, ondansetron (ZOFRAN) IV, ondansetron (ZOFRAN) IV, ondansetron, technetium albumin aggregated, xenon xe 133, DISCONTD: morphine  Assessment/Plan:  Principal Problem:  *Pulmonary embolism Active Problems:  Diabetes mellitus  Syncope and collapse   Dehydration  Chronic diarrhea  Nausea and vomiting  Elevated troponin  Abnormal ECG  Chronic renal insufficiency  DVT (deep venous thrombosis)   1. Syncope/SOB/minimally elevated Trop: VQ scan was high probability for PE. Venous dopplers show b/l DVT as well. This is the explanation for patient's presentation.   2. Acute PE and b/l DVT's: Likely due to surgery recently in January. Now on Lovenox and Warfarin. Check RA sats.  3. Acute on chronic renal insufficiency:  Worsening creatinine. Urine output with darkk urine. Place strict I&O's, hold nephrotoxins, urinalysis. Will give 500cc bolus IV NS followed by gentle hydration as this is most likely pre-renal. Recheck renal function in AM.   4. Chronic nausea, vomiting, and diarrhea: His GI issues are chronic and as discussed may not necessarily need inpatient w/u, but would refer to GI on OP basis. No N/V or diarrhea currently.  5. DM2: Continue NPH and SSI.  A1c 7.9. One episode of hypoglycemia this am. Since will start steroids for acute gout, will not adjust insulin dose for now.  6. Chronic CHF (diastolic): Normal systolic function.  7. Acute Gout. Hx of same. Will resume colchicine. Reports morphine not effective. Will change to dilaudid for pain control uric acid level 12.6. Will initiate steroids as unable to give NSAIDS.  8. Dispo: Home when medically stable.      LOS: 2 days   Paradise Vensel 04/19/2012, 3:34 PM

## 2012-04-19 NOTE — Progress Notes (Signed)
Late Entry  Pt was admitted to the 4th floor on 04/18/12 shortly after midnight. He had continuous fluid orders for normal saline at 149mL/hr. Due to history of CHF, I was concerned regarding the fluid rate. Patient also had edema in his BLE and was dyspneic with any exertion. After speaking with another nurse, I paged the NP on call with my concern.  She agreed to put in an order to decrease the fluids. I went ahead and reduced the rate to [facility] and notified day shift RN of the change I had made. I recommended to day shift RN that she double check with the rounding physician what they would like the fluids at. This morning I learned that an order was never put in last night to reduce the rate. On the night of 04/18/12, I paged the NP on call who I spoke with last night and let her know about this and she said she would take a look at the chart and information.  Angelena Form, RN

## 2012-04-19 NOTE — Progress Notes (Signed)
  Echocardiogram 2D Echocardiogram has been performed.  Laural Benes, Colonel Krauser A 04/19/2012, 10:06 AM

## 2012-04-19 NOTE — Progress Notes (Signed)
ANTICOAGULATION CONSULT NOTE - Follow Up Consult  Pharmacy Consult for Lovenox and Warfarin Indication: pulmonary embolus  Allergies  Allergen Reactions  . Ciprofloxacin Hives  . Levofloxacin (Levaquin) Hives    Patient Measurements: Height: 6' 0.83" (185 cm) Weight: 351 lb 10.1 oz (159.5 kg) (standup scale) IBW/kg (Calculated) : 79.52   Vital Signs: Temp: 98.8 F (37.1 C) (04/25 0450) Temp src: Oral (04/25 0450) BP: 125/75 mmHg (04/25 0534) Pulse Rate: 86  (04/25 0534)  Labs:  Mitchell Parker 04/19/12 0523 04/18/12 1614 04/18/12 0645 04/18/12 0510 04/18/12 0010 04/17/12 2015  HGB 12.7* -- -- 13.8 -- --  HCT 39.2 -- -- 41.5 -- 45.0  PLT 160 -- -- 177 -- 183  APTT -- -- -- -- -- 32  LABPROT 13.0 -- -- -- -- 14.0  INR 0.96 -- -- -- -- 1.06  HEPARINUNFRC -- -- <0.10* -- -- --  CREATININE 2.23* -- -- 1.76* -- 1.71*  CKTOTAL -- 84 97 -- 111 --  CKMB -- 1.6 2.2 -- 2.6 --  TROPONINI -- <0.30 <0.30 -- 0.37* --   Estimated Creatinine Clearance: 56.9 ml/min (by C-G formula based on Cr of 2.23).   Medications:  Scheduled:    . amLODipine  2.5 mg Oral Daily  . aspirin EC  325 mg Oral Daily  . atorvastatin  20 mg Oral q1800  . benazepril  10 mg Oral Daily  . calcitRIOL  0.25 mcg Oral Q breakfast  . carvedilol  25 mg Oral BID WC  . enoxaparin (LOVENOX) injection  150 mg Subcutaneous Q12H  . gabapentin  300 mg Oral BID  . hydroxychloroquine  200 mg Oral Daily  . insulin aspart  25-50 Units Subcutaneous TID WC  . insulin NPH  90 Units Subcutaneous BID  . lisinopril  10 mg Oral BID  . metoCLOPramide  5 mg Oral TID AC & HS  . patient's guide to using coumadin book   Does not apply Once  . senna  1 tablet Oral BID  . sodium chloride  3 mL Intravenous Q12H  . warfarin  10 mg Oral Once  . warfarin   Does not apply Once  . Warfarin - Pharmacist Dosing Inpatient   Does not apply q1800  . DISCONTD: docusate sodium  100 mg Oral BID  . DISCONTD: simvastatin  40 mg Oral q1800     Assessment:  61 yo M with new PE and chronic renal insufficiency.  Originally started on IV heparin (r/o ACS) from 4/23 at 2200 - 4/24 at 1200  VQ scan: High probability of acute pulmonary embolus  LE venous Duplex pending  SCr elevated to 2.2 today with acute on chronic renal failure, CrCl (n) ~ 37  INR subtherapeutic as expected after only one dose  Goal of Therapy:  INR 2-3 Anti-Xa level (LMWH) 0.6-1 units/ml   Plan:   Continue Lovenox 150mg  SQ q12h  LMWH peak level to monitor Lovenox dosing  Warfarin 10mg  PO today at 1800  Daily INR, CBC  Warfarin education   Lynann Beaver PharmD, BCPS Pager [phone] 04/19/2012 7:51 AM

## 2012-04-19 NOTE — Progress Notes (Signed)
VASCULAR LAB PRELIMINARY  PRELIMINARY  PRELIMINARY  PRELIMINARY  Bilateral lower extremity venous duplex completed.    Preliminary report:  Right:  DVT noted in the proximal femoral vein extending through the common femoral vein .  Evidence of superficial thrombosis noted in the proximal greater saphenous vein at the confluence with the common femoral.  No Baker's cyst. Left: DVT noted in the common femoral vein.  No evidence of superficial thrombosis.  No Baker's cyst.  Demian Maisel D, RVS 04/19/2012, 8:59 AM

## 2012-04-20 LAB — CBC
HCT: 41.1 % (ref 39.0–52.0)
Hemoglobin: 13.5 g/dL (ref 13.0–17.0)
MCH: 28.1 pg (ref 26.0–34.0)
MCHC: 32.8 g/dL (ref 30.0–36.0)
MCV: 85.6 fL (ref 78.0–100.0)
Platelets: 181 10*3/uL (ref 150–400)
RBC: 4.8 MIL/uL (ref 4.22–5.81)
RDW: 15.7 % — ABNORMAL HIGH (ref 11.5–15.5)
WBC: 14.7 10*3/uL — ABNORMAL HIGH (ref 4.0–10.5)

## 2012-04-20 LAB — PROTIME-INR
INR: 1.23 (ref 0.00–1.49)
Prothrombin Time: 15.8 seconds — ABNORMAL HIGH (ref 11.6–15.2)

## 2012-04-20 LAB — BASIC METABOLIC PANEL
BUN: 31 mg/dL — ABNORMAL HIGH (ref 6–23)
CO2: 26 mEq/L (ref 19–32)
Calcium: 8.1 mg/dL — ABNORMAL LOW (ref 8.4–10.5)
Chloride: 98 mEq/L (ref 96–112)
Creatinine, Ser: 3.05 mg/dL — ABNORMAL HIGH (ref 0.50–1.35)
GFR calc Af Amer: 24 mL/min — ABNORMAL LOW (ref >90)
GFR calc non Af Amer: 21 mL/min — ABNORMAL LOW (ref >90)
Glucose, Bld: 81 mg/dL (ref 70–99)
Potassium: 4.2 mEq/L (ref 3.5–5.1)
Sodium: 136 mEq/L (ref 135–145)

## 2012-04-20 LAB — GLUCOSE, CAPILLARY
Glucose-Capillary: 81 mg/dL (ref 70–99)
Glucose-Capillary: 110 mg/dL — ABNORMAL HIGH (ref 70–99)
Glucose-Capillary: 120 mg/dL — ABNORMAL HIGH (ref 70–99)
Glucose-Capillary: 127 mg/dL — ABNORMAL HIGH (ref 70–99)

## 2012-04-20 MED ORDER — CEFUROXIME AXETIL 250 MG PO TABS
250.0000 mg | ORAL_TABLET | Freq: Two times a day (BID) | ORAL | Status: DC
  Administered 2012-04-20 – 2012-04-24 (×9): 250 mg via ORAL
  Filled 2012-04-20 (×11): qty 1

## 2012-04-20 MED ORDER — ENOXAPARIN SODIUM 150 MG/ML ~~LOC~~ SOLN
150.0000 mg | SUBCUTANEOUS | Status: DC
  Administered 2012-04-20 – 2012-04-24 (×5): 150 mg via SUBCUTANEOUS
  Filled 2012-04-20 (×5): qty 1

## 2012-04-20 MED ORDER — ENOXAPARIN SODIUM 120 MG/0.8ML ~~LOC~~ SOLN
120.0000 mg | Freq: Two times a day (BID) | SUBCUTANEOUS | Status: DC
  Filled 2012-04-20 (×3): qty 0.8

## 2012-04-20 MED ORDER — PROMETHAZINE HCL 25 MG/ML IJ SOLN
12.5000 mg | Freq: Four times a day (QID) | INTRAMUSCULAR | Status: DC | PRN
  Administered 2012-04-20 – 2012-04-21 (×3): 12.5 mg via INTRAVENOUS
  Filled 2012-04-20 (×3): qty 1

## 2012-04-20 MED ORDER — SODIUM CHLORIDE 0.45 % IV SOLN
INTRAVENOUS | Status: DC
  Administered 2012-04-20 (×2): via INTRAVENOUS
  Administered 2012-04-23: 20 mL/h via INTRAVENOUS

## 2012-04-20 MED ORDER — WARFARIN SODIUM 10 MG PO TABS
10.0000 mg | ORAL_TABLET | Freq: Once | ORAL | Status: AC
  Administered 2012-04-20: 10 mg via ORAL
  Filled 2012-04-20: qty 1

## 2012-04-20 MED ORDER — CARVEDILOL 12.5 MG PO TABS
12.5000 mg | ORAL_TABLET | Freq: Two times a day (BID) | ORAL | Status: DC
  Administered 2012-04-21 – 2012-04-24 (×7): 12.5 mg via ORAL
  Filled 2012-04-20 (×10): qty 1

## 2012-04-20 MED ORDER — FUROSEMIDE 10 MG/ML IJ SOLN
80.0000 mg | Freq: Once | INTRAMUSCULAR | Status: AC
  Administered 2012-04-21: 80 mg via INTRAVENOUS
  Filled 2012-04-20: qty 8

## 2012-04-20 MED ORDER — HYDROMORPHONE HCL PF 1 MG/ML IJ SOLN
1.0000 mg | INTRAMUSCULAR | Status: DC | PRN
  Administered 2012-04-20 – 2012-04-23 (×12): 1 mg via INTRAVENOUS
  Filled 2012-04-20 (×13): qty 1

## 2012-04-20 NOTE — Progress Notes (Signed)
Subjective:  Lying in bed. Reports no improvement in elbow pain. Reports nausea/vomiting early this am. NAD  Objective: Vital signs Filed Vitals:   04/19/12 1325 04/19/12 2125 04/20/12 0200 04/20/12 0300  BP: 126/37 127/73  115/62  Pulse: 61 105  102  Temp: 100.2 F (37.9 C) 101.4 F (38.6 C) 97.5 F (36.4 C) 100 F (37.8 C)  TempSrc: Oral Oral Oral Oral  Resp: 20 20  22   Height:      Weight:    164.1 kg (361 lb 12.4 oz)  SpO2: 95% 99%     Weight change: 4.6 kg (10 lb 2.3 oz) Last BM Date: 04/18/12  Intake/Output from previous day: 04/25 0701 - 04/26 0700 In: 1340 [P.O.:840; IV Piggyback:500] Out: 575 [Urine:575]     Physical Exam: General: Alert, awake, oriented x3, in no acute distress. HEENT: No bruits, no goiter. PERRL. Mucus membrane mouth slightly dry Heart: Regular rate and rhythm, without murmurs, rubs, gallops. Lungs:Normal effort. Breath sounds  Clear to auscultation bilaterally. No wheeze. Does use abdominal accessory muscles but reports this is baseline Abdomen: Obese,Soft, nontender, nondistended, positive bowel sounds but very sluggish.  Extremities: No clubbing cyanosis  with positive pedal pulses. Trace -1+ UE edema. Chronic venous stasis changes LE Neuro: Grossly intact, nonfocal. Cranial nerve II-XII intact    Lab Results: Basic Metabolic Panel:  Basename 04/20/12 0450 04/19/12 0523  NA 136 137  K 4.2 3.5  CL 98 101  CO2 26 29  GLUCOSE 81 74  BUN 31* 25*  CREATININE 3.05* 2.23*  CALCIUM 8.1* 7.9*  MG -- --  PHOS -- --   Liver Function Tests:  [facility] 04/17/12 2015  AST 34  ALT 20  ALKPHOS 60  BILITOT 0.6  PROT 7.3  ALBUMIN 3.6    Basename 04/17/12 2015  LIPASE 15  AMYLASE --   No results found for this basename: AMMONIA:2 in the last 72 hours CBC:  Basename 04/20/12 0450 04/19/12 0523 04/17/12 2015  WBC 14.7* 6.5 --  NEUTROABS -- -- 5.5  HGB 13.5 12.7* --  HCT 41.1 39.2 --  MCV 85.6 85.2 --  PLT 181 160 --   Cardiac  Enzymes:  Basename 04/18/12 1614 04/18/12 0645 04/18/12 0010  CKTOTAL 84 97 111  CKMB 1.6 2.2 2.6  CKMBINDEX -- -- --  TROPONINI <0.30 <0.30 0.37*   BNP:  Basename 04/17/12 2015  PROBNP 11286.0*   D-Dimer: No results found for this basename: DDIMER:2 in the last 72 hours CBG:  Basename 04/20/12 0727 04/19/12 2121 04/19/12 1729 04/19/12 1149 04/19/12 0818 04/19/12 0751  GLUCAP 81 90 93 154* 74 65*   Hemoglobin A1C:  Basename 04/18/12 0510  HGBA1C 7.9*   Fasting Lipid Panel: No results found for this basename: CHOL,HDL,LDLCALC,TRIG,CHOLHDL,LDLDIRECT in the last 72 hours Thyroid Function Tests: No results found for this basename: TSH,T4TOTAL,FREET4,T3FREE,THYROIDAB in the last 72 hours Anemia Panel: No results found for this basename: VITAMINB12,FOLATE,FERRITIN,TIBC,IRON,RETICCTPCT in the last 72 hours Coagulation:  Basename 04/20/12 0450 04/19/12 0523  LABPROT 15.8* 13.0  INR 1.23 0.96   Urine Drug Screen: Drugs of Abuse  No results found for this basename: labopia, cocainscrnur, labbenz, amphetmu, thcu, labbarb    Alcohol Level: No results found for this basename: ETH:2 in the last 72 hours Urinalysis:  Basename 04/19/12 1757  COLORURINE AMBER*  LABSPEC 1.022  PHURINE 6.0  GLUCOSEU NEGATIVE  HGBUR SMALL*  BILIRUBINUR SMALL*  KETONESUR TRACE*  PROTEINUR NEGATIVE  UROBILINOGEN 1.0  NITRITE NEGATIVE  LEUKOCYTESUR LARGE*  Misc. Labs:  No results found for this or any previous visit (from the past 240 hour(s)).  Studies/Results: Nm Pulmonary Per & Vent  04/18/2012  *RADIOLOGY REPORT*  Clinical Data:  61 year old male with shortness of breath.  NUCLEAR MEDICINE VENTILATION - PERFUSION LUNG SCAN  Technique:  Wash-in, equilibrium, and wash-out phase ventilation images were obtained using Xe-133 gas.  Perfusion images were obtained in multiple projections after intravenous injection of Tc- 39m MAA.  Radiopharmaceuticals:  3.3 mCi Xe-133 gas and 10.0 mCi Tc-71m  MAA.  Comparison:  Portable chest radiograph 04/17/2012 at 2047 hours.  Findings: Comparison chest radiograph demonstrates cardiomegaly and mild vascular congestion.  Ventilation imaging demonstrates no ventilation defect.  Perfusion imaging demonstrates significant lack of perfusion at the right lung base.  Additional small moderate sized wedge shaped peripheral perfusion defects, more numerous in the right upper lobe but one or two small lesions also present in the left upper lobe.  IMPRESSION: High probability of acute pulmonary embolus.  Critical Value/emergent results were called by telephone at the time of interpretation on 04/18/2012  at 1615 hours  to  Dr. Osvaldo Shipper, who verbally acknowledged these results.  Original Report Authenticated By: Harley Hallmark, M.D.    Medications: Scheduled Meds:   . amLODipine  2.5 mg Oral Daily  . aspirin EC  325 mg Oral Daily  . atorvastatin  20 mg Oral q1800  . calcitRIOL  0.25 mcg Oral Q breakfast  . carvedilol  25 mg Oral BID WC  . cefTRIAXone (ROCEPHIN)  IV  1 g Intravenous Once  . cefUROXime  250 mg Oral BID WC  . colchicine  0.6 mg Oral Daily  . colchicine  0.6 mg Oral Q1H  . enoxaparin (LOVENOX) injection  150 mg Subcutaneous Q24H  . gabapentin  300 mg Oral BID  . hydroxychloroquine  200 mg Oral Daily  . insulin aspart  25-50 Units Subcutaneous TID WC  . insulin NPH  90 Units Subcutaneous BID  . metoCLOPramide  5 mg Oral TID AC & HS  . predniSONE  40 mg Oral BID WC  . senna  1 tablet Oral BID  . sodium chloride  500 mL Intravenous Once  . sodium chloride  3 mL Intravenous Q12H  . warfarin  10 mg Oral ONCE-1800  . warfarin  10 mg Oral ONCE-1800  . warfarin   Does not apply Once  . Warfarin - Pharmacist Dosing Inpatient   Does not apply q1800  . DISCONTD: benazepril  10 mg Oral Daily  . DISCONTD: cefUROXime  250 mg Oral Q12H  . DISCONTD: enoxaparin (LOVENOX) injection  120 mg Subcutaneous Q12H  . DISCONTD: enoxaparin (LOVENOX)  injection  150 mg Subcutaneous Q12H  . DISCONTD: enoxaparin (LOVENOX) injection  150 mg Subcutaneous Q12H  . DISCONTD: lisinopril  10 mg Oral BID   Continuous Infusions:   . sodium chloride     PRN Meds:.acetaminophen, acetaminophen, albuterol-ipratropium, HYDROmorphone (DILAUDID) injection, ondansetron (ZOFRAN) IV, ondansetron (ZOFRAN) IV, ondansetron, DISCONTD: morphine  Assessment/Plan:  Principal Problem:  *Pulmonary embolism Active Problems:  Diabetes mellitus  Syncope and collapse  Dehydration  Chronic diarrhea  Nausea and vomiting  Elevated troponin  Abnormal ECG  Chronic renal insufficiency  DVT (deep venous thrombosis) 1. Syncope/SOB/minimally elevated Trop: VQ scan was high probability for PE. Venous dopplers show b/l DVT as well. This is the explanation for patient's presentation.   2. Acute PE and b/l DVT's: Likely due to surgery recently in January. Now on Lovenox and Warfarin.  RA sats pending.   3. Acute on chronic renal insufficiency: Worsening creatinine. Urine output for last 24 hours. Continue Strict I&O's, hold nephrotoxins. Will start 1/2 NS at 61ml/hr  as this is most likely pre-renal also concern for uric acid nephropathy.  Request renal consult. Monitor closely  4. UTI: per urinalysis. Likely related to #3. Will request culture and give rocephin waiting for results.   5. Leukocytosis/fever most likely related to #4. Will obtain blood cultures. Rocephin day #2. VSS. Monitor closely    6. Chronic nausea, vomiting, and diarrhea: His GI issues are chronic and as discussed may not necessarily need inpatient w/u, but would refer to GI on OP basis. One episode N/V this am. Provide antiemetics. Monitor. If worsens consider GI consult   7. DM2: Continue NPH and SSI. A1c 7.9. One episode of hypoglycemia this am. Since will start steroids for acute gout, will not adjust insulin dose for now. CBG range 81-93.   8. Chronic CHF (diastolic): Normal systolic  function.   9. Acute Gout. Hx of same. Not much improvement. Has had only one dose steroids. Continue  Colchicine and dilaudid for pain control. continue steroids as unable to give NSAIDS.  8. Dispo: Home when medically stable.         LOS: 3 days   [facility] M 04/20/2012, 9:03 AM

## 2012-04-20 NOTE — Progress Notes (Signed)
PT/OT/ST Cancellation Note  ___Treatment cancelled today due to medical issues with patient which prohibited therapy  ___ Treatment cancelled today due to patient receiving procedure or test   _x__ Treatment cancelled today due to patient's refusal to participate. "Gout in elbow. Can't do it today." Pt requests PT check back another day. Thanks.   ___ Treatment cancelled today due to   Signature:  Rebeca Alert, PT [phone]

## 2012-04-20 NOTE — Progress Notes (Signed)
Patient seen and examined. Agree with above note. Fever could be any of the following: UTI, Gout, DVT.  Continue antibiotics. Use of steroid for gout in a patient with fever is being done cautiously weighing risks and benefit. Continue for now. Await Nephrology input. Will continue to monitor.  Raffael Bugarin 12:32 PM

## 2012-04-20 NOTE — Progress Notes (Signed)
ANTICOAGULATION CONSULT NOTE - Follow Up Consult  Pharmacy Consult for lovenox Indication: pulmonary embolus  Allergies  Allergen Reactions  . Ciprofloxacin Hives  . Levofloxacin (Levaquin) Hives    Patient Measurements: Height: 6' 0.83" (185 cm) Weight: 351 lb 10.1 oz (159.5 kg) (standup scale) IBW/kg (Calculated) : 79.52  Heparin Dosing Weight:   Vital Signs: Temp: 101.4 F (38.6 C) (04/25 2125) Temp src: Oral (04/25 2125) BP: 127/73 mmHg (04/25 2125) Pulse Rate: 105  (04/25 2125)  Labs:  Mitchell Parker 04/19/12 0523 04/18/12 1614 04/18/12 0645 04/18/12 0510 04/18/12 0010 04/17/12 2015  HGB 12.7* -- -- 13.8 -- --  HCT 39.2 -- -- 41.5 -- 45.0  PLT 160 -- -- 177 -- 183  APTT -- -- -- -- -- 32  LABPROT 13.0 -- -- -- -- 14.0  INR 0.96 -- -- -- -- 1.06  HEPARINUNFRC -- -- <0.10* -- -- --  CREATININE 2.23* -- -- 1.76* -- 1.71*  CKTOTAL -- 84 97 -- 111 --  CKMB -- 1.6 2.2 -- 2.6 --  TROPONINI -- <0.30 <0.30 -- 0.37* --   Estimated Creatinine Clearance: 56.9 ml/min (by C-G formula based on Cr of 2.23).   Medications:  Scheduled:    . amLODipine  2.5 mg Oral Daily  . aspirin EC  325 mg Oral Daily  . atorvastatin  20 mg Oral q1800  . calcitRIOL  0.25 mcg Oral Q breakfast  . carvedilol  25 mg Oral BID WC  . cefTRIAXone (ROCEPHIN)  IV  1 g Intravenous Once  . cefUROXime  250 mg Oral Q12H  . colchicine  0.6 mg Oral Daily  . colchicine  0.6 mg Oral Q1H  . enoxaparin (LOVENOX) injection  120 mg Subcutaneous Q12H  . gabapentin  300 mg Oral BID  . hydroxychloroquine  200 mg Oral Daily  . insulin aspart  25-50 Units Subcutaneous TID WC  . insulin NPH  90 Units Subcutaneous BID  . metoCLOPramide  5 mg Oral TID AC & HS  . predniSONE  40 mg Oral BID WC  . senna  1 tablet Oral BID  . sodium chloride  500 mL Intravenous Once  . sodium chloride  3 mL Intravenous Q12H  . warfarin  10 mg Oral ONCE-1800  . warfarin   Does not apply Once  . Warfarin - Pharmacist Dosing Inpatient    Does not apply q1800  . DISCONTD: benazepril  10 mg Oral Daily  . DISCONTD: enoxaparin (LOVENOX) injection  150 mg Subcutaneous Q12H  . DISCONTD: enoxaparin (LOVENOX) injection  150 mg Subcutaneous Q12H  . DISCONTD: lisinopril  10 mg Oral BID    Assessment: Patient on lovenox for PE.  4hour LMWH level drawn at 5hours prior to last dose was high at 1.36 on 150mg  sq q12hr.    Goal of Therapy:  Anti-Xa level (LMWH) 0.6-1 units/ml    Plan:  Decrease lovenox to 120mg  (20% reduction) sq q12hr with next dose at 0800 Recheck level as needed  Darlina Guys, Jacquenette Shone Crowford 04/20/2012,12:58 AM

## 2012-04-20 NOTE — Progress Notes (Signed)
ANTICOAGULATION CONSULT NOTE - Follow Up Consult  Pharmacy Consult for Lovenox and Warfarin Indication: pulmonary embolus  Allergies  Allergen Reactions  . Ciprofloxacin Hives  . Levofloxacin (Levaquin) Hives    Patient Measurements: Height: 6' 0.83" (185 cm) Weight: 361 lb 12.4 oz (164.1 kg) IBW/kg (Calculated) : 79.52   Vital Signs: Temp: 100 F (37.8 C) (04/26 0300) Temp src: Oral (04/26 0300) BP: 115/62 mmHg (04/26 0300) Pulse Rate: 102  (04/26 0300)  Labs:  Alvira Philips 04/20/12 0450 04/19/12 0523 04/18/12 1614 04/18/12 0645 04/18/12 0510 04/18/12 0010 04/17/12 2015  HGB 13.5 12.7* -- -- -- -- --  HCT 41.1 39.2 -- -- 41.5 -- --  PLT 181 160 -- -- 177 -- --  APTT -- -- -- -- -- -- 32  LABPROT 15.8* 13.0 -- -- -- -- 14.0  INR 1.23 0.96 -- -- -- -- 1.06  HEPARINUNFRC -- -- -- <0.10* -- -- --  CREATININE 3.05* 2.23* -- -- 1.76* -- --  CKTOTAL -- -- 84 97 -- 111 --  CKMB -- -- 1.6 2.2 -- 2.6 --  TROPONINI -- -- <0.30 <0.30 -- 0.37* --   Estimated Creatinine Clearance: 42.3 ml/min (by C-G formula based on Cr of 3.05).   Medications:  Scheduled:     . amLODipine  2.5 mg Oral Daily  . aspirin EC  325 mg Oral Daily  . atorvastatin  20 mg Oral q1800  . calcitRIOL  0.25 mcg Oral Q breakfast  . carvedilol  25 mg Oral BID WC  . cefTRIAXone (ROCEPHIN)  IV  1 g Intravenous Once  . cefUROXime  250 mg Oral BID WC  . colchicine  0.6 mg Oral Daily  . colchicine  0.6 mg Oral Q1H  . enoxaparin (LOVENOX) injection  150 mg Subcutaneous Q24H  . gabapentin  300 mg Oral BID  . hydroxychloroquine  200 mg Oral Daily  . insulin aspart  25-50 Units Subcutaneous TID WC  . insulin NPH  90 Units Subcutaneous BID  . metoCLOPramide  5 mg Oral TID AC & HS  . predniSONE  40 mg Oral BID WC  . senna  1 tablet Oral BID  . sodium chloride  500 mL Intravenous Once  . sodium chloride  3 mL Intravenous Q12H  . warfarin  10 mg Oral ONCE-1800  . warfarin   Does not apply Once  . Warfarin -  Pharmacist Dosing Inpatient   Does not apply q1800  . DISCONTD: benazepril  10 mg Oral Daily  . DISCONTD: cefUROXime  250 mg Oral Q12H  . DISCONTD: enoxaparin (LOVENOX) injection  120 mg Subcutaneous Q12H  . DISCONTD: enoxaparin (LOVENOX) injection  150 mg Subcutaneous Q12H  . DISCONTD: enoxaparin (LOVENOX) injection  150 mg Subcutaneous Q12H  . DISCONTD: lisinopril  10 mg Oral BID    Assessment:  61 yo M with new PE and chronic renal insufficiency.  Originally started on IV heparin (r/o ACS) from 4/23 at 2200 - 4/24 at 1200  VQ scan: High probability of acute pulmonary embolus  LE venous Duplex pending  SCr elevated to 3.1 today with acute on chronic renal failure, CrCl (n) ~ 27  INR subtherapeutic but increasing with warfarin initiation  Anti-Xa level was elevated, likely due to full dose lovenox with acute renal failure  Goal of Therapy:  INR 2-3 Anti-Xa level (LMWH) 0.6-1 units/ml   Plan:   Reduce Lovenox 150mg  SQ to q24h  LMWH peak level to monitor Lovenox dosing  Warfarin 10mg  PO today  at 1800  Daily INR, CBC  Warfarin education   Lynann Beaver PharmD, BCPS Pager [phone] 04/20/2012 8:21 AM

## 2012-04-20 NOTE — Consult Note (Signed)
Mitchell Parker Sr. 04/20/2012 Paulino Cork D Requesting Physician:  Dr. Rito Ehrlich  Reason for Consult:  Acute and chronic renal failure HPI: The patient is a 61 y.o. year-old with morbid obesity, HTN over 10 yrs, DM 2 with neuropathy, COPD, OSA, hx NICM (recovered EF) and chronic LE edema.  He has CKD with baseline creat 1.5-2.0.  He was admitted on 4/24 for a syncopal episode that happened after he was mowing the lawn. Work up showed mildly elevated troponin, high BNP which was new, CM on cxr without edema. Creat was 1.71 on admission. He was treated with IV heparin and ASA and was continued on home BP meds of amlodipine, benazepril and coreg. V/Q scan was done which was + for PE, LE dopplers showed bilat DVT.  UOP dropped, so ACEI was stopped after first two days in hospital. BP dropped to low 100's at the lowest. Creatinine rose to 2.23 yesterday and 3.05 today. UOP is poor.   Renal US showed normal sized kidneys, no hydro. Old CT form Oct 2012 showed bilateral cortical renal atrophy, which is moderate in severity. UA here is negative for protein,3-6 RBC's,  +many WBC's and bacteria.   Creatinine, Ser  Date/Time Value Range Status  04/20/2012  4:50 AM 3.05* 0.50-1.35 (mg/dL) Final  0/98/1191  4:78 AM 2.23* 0.50-1.35 (mg/dL) Final  2/95/6213  0:86 AM 1.76* 0.50-1.35 (mg/dL) Final  5/78/4696  2:95 PM 1.71* 0.50-1.35 (mg/dL) Final  2/84/1324  4:01 AM 1.91* 0.50-1.35 (mg/dL) Final  0/27/2536  6:44 AM 2.11* 0.50-1.35 (mg/dL) Final  0/34/7425  9:56 PM 1.49* 0.50-1.35 (mg/dL) Final  3/87/5643  3:29 AM 1.39* 0.50-1.35 (mg/dL) Final  05/13/8415  6:06 PM 1.33  0.50-1.35 (mg/dL) Final  30/12/6008  9:32 AM 0.93  0.50-1.35 (mg/dL) Final  35/04/7321  0:25 AM 1.03  0.50-1.35 (mg/dL) Final  42/70/6237  6:28 AM 1.06  0.50-1.35 (mg/dL) Final  31/51/7616  0:73 AM 1.21  0.50-1.35 (mg/dL) Final  71/05/2693  8:54 AM 1.38* 0.50-1.35 (mg/dL) Final  62/70/3500  9:38 AM 1.52* 0.50-1.35 (mg/dL) Final  18/29/9371  6:96  AM 1.71* 0.50-1.35 (mg/dL) Final  78/93/8101  7:51 PM 1.74* 0.50-1.35 (mg/dL) Final    Past Medical History:  Past Medical History  Diagnosis Date  . CHF (congestive heart failure)     H/o nonischemic CMP with EF 17% in 2003, however echo 09/2011 with EF 60-65%. Not ischemic by cath, no MI, stents, etc  . Diabetes mellitus     On insulin  . Neuropathy   . Hypertension   . COPD (chronic obstructive pulmonary disease)   . Gallstones   . RA (rheumatoid arthritis)   . Obese   . Kidney stones   . Piriformis syndrome     left hip  . Lumbar disc disease     compression   . Sleep apnea   . Gout     Past Surgical History:  Past Surgical History  Procedure Date  . Umbilical hernia repair   . Pilonidal cyst excision 1997  . Varicose vein surgery     left leg  . Cholecystectomy 01/18/2012    Procedure: LAPAROSCOPIC CHOLECYSTECTOMY WITH INTRAOPERATIVE CHOLANGIOGRAM;  Surgeon: Clovis Pu. Cornett, MD;  Location: [facility];  Service: General;  Laterality: N/A;    Family History:  Family History  Problem Relation Age of Onset  . Hypertension Mother   . Diabetes Father   . Diabetes Brother    Social History:  reports that he quit smoking about 17 years ago. He has never  used smokeless tobacco. He reports that he does not drink alcohol or use illicit drugs.  Allergies:  Allergies  Allergen Reactions  . Ciprofloxacin Hives  . Levofloxacin (Levaquin) Hives    Home medications: Prior to Admission medications   Medication Sig Start Date End Date Taking? Authorizing Provider  albuterol-ipratropium (COMBIVENT) 18-103 MCG/ACT inhaler Inhale 2 puffs into the lungs every 6 (six) hours as needed. Wheezing   Yes Historical Provider, MD  amlodipine-benazepril (LOTREL) 2.5-10 MG per capsule Take 1 capsule by mouth daily.   Yes Historical Provider, MD  aspirin 81 MG tablet Take 81 mg by mouth 2 (two) times daily.    Yes Historical Provider, MD  calcitRIOL (ROCALTROL) 0.25 MCG capsule Take 0.25  mcg by mouth daily with breakfast.    Yes Historical Provider, MD  carvedilol (COREG) 25 MG tablet Take 25 mg by mouth 2 (two) times daily with a meal.     Yes Historical Provider, MD  furosemide (LASIX) 80 MG tablet Take 80 mg by mouth 2 (two) times daily.     Yes Historical Provider, MD  gabapentin (NEURONTIN) 300 MG capsule Take 300 mg by mouth 2 (two) times daily.     Yes Historical Provider, MD  HYDROcodone-acetaminophen (NORCO) 7.5-325 MG per tablet Take 1 tablet by mouth every 8 (eight) hours as needed for pain. Pain 01/21/12  Yes Dorothea Ogle, MD  HYDROXYCHLOROQUINE SULFATE PO Take 300 mg by mouth 2 (two) times daily.   Yes Historical Provider, MD  insulin aspart (NOVOLOG) 100 UNIT/ML injection Inject 50 Units into the skin 3 (three) times daily before meals.   Yes Historical Provider, MD  insulin NPH (HUMULIN N,NOVOLIN N) 100 UNIT/ML injection Inject 90 Units into the skin 2 (two) times daily.     Yes Historical Provider, MD  lisinopril (PRINIVIL,ZESTRIL) 10 MG tablet Take 10 mg by mouth 2 (two) times daily. Take 2 tablets in the morning and 1 tablet in the evening   Yes Historical Provider, MD  omeprazole (PRILOSEC) 20 MG capsule Take 20 mg by mouth every morning.     Yes Historical Provider, MD  pravastatin (PRAVACHOL) 80 MG tablet Take 80 mg by mouth at bedtime.     Yes Historical Provider, MD  traMADol (ULTRAM) 50 MG tablet Take 50 mg by mouth every 8 (eight) hours as needed. Maximum dose= 8 tablets per day. Pain    Yes Historical Provider, MD    Inpatient medications:    . aspirin EC  325 mg Oral Daily  . atorvastatin  20 mg Oral q1800  . calcitRIOL  0.25 mcg Oral Q breakfast  . carvedilol  12.5 mg Oral BID WC  . cefUROXime  250 mg Oral BID WC  . colchicine  0.6 mg Oral Daily  . enoxaparin (LOVENOX) injection  150 mg Subcutaneous Q24H  . furosemide  80 mg Intravenous Once  . gabapentin  300 mg Oral BID  . hydroxychloroquine  200 mg Oral Daily  . insulin aspart  25-50 Units  Subcutaneous TID WC  . insulin NPH  90 Units Subcutaneous BID  . metoCLOPramide  5 mg Oral TID AC & HS  . predniSONE  40 mg Oral BID WC  . senna  1 tablet Oral BID  . sodium chloride  3 mL Intravenous Q12H  . warfarin  10 mg Oral ONCE-1800  . Warfarin - Pharmacist Dosing Inpatient   Does not apply q1800  . DISCONTD: amLODipine  2.5 mg Oral Daily  . DISCONTD: carvedilol  25 mg Oral BID WC  . DISCONTD: cefUROXime  250 mg Oral Q12H  . DISCONTD: enoxaparin (LOVENOX) injection  120 mg Subcutaneous Q12H  . DISCONTD: enoxaparin (LOVENOX) injection  150 mg Subcutaneous Q12H    Review of Systems Gen:  Denies headache, fever, chills, sweats.  No weight loss. HEENT:  No visual change, sore throat, difficulty swallowing. Resp:  No difficulty breathing, DOE.  No cough or hemoptysis. Cardiac:  No chest pain, orthopnea, PND.  Denies edema. GI:   Denies abdominal pain.   No nausea, vomiting, diarrhea.  No constipation. GU:  Denies difficulty or change in voiding.  No change in urine color.     MS:  + pain throughout arms and elbows, says it is a gout flare up.    Derm:  Denies skin rash or itching.  No chronic skin conditions. +chronic LE edema Neuro:   Denies focal weakness, memory problems, hx stroke or TIA.   Psych:  Denies symptoms of depression of anxiety.  No hallucination.    Physical Exam:  Blood pressure 117/52, pulse 64, temperature 99.7 F (37.6 C), temperature source Oral, resp. rate 20, height 6' 0.84" (1.85 m), weight 164.1 kg (361 lb 12.4 oz), SpO2 96.00%.  Gen: obese WM, swollen arms and face Skin: no rash, cyanosis Neck: no JVD, bruits or LAN Chest: CTA to the bases, no rales or wheezing Heart: regular, no rub or gallop Abdomen: soft, markedly obese, dec'd BS, no ascites or HSM Ext: brawny skin changes bilat 1-2+ diffuse pitting edema of thighs and arms primarily, 1-2+ scrotal edema Neuro: alert, Ox3, no focal deficit Heme/Lymph: no bruising or LAN Weight - 155 > 164 in  hospital  Labs: Basic Metabolic Panel:  Lab 04/20/12 1610 04/19/12 0523 04/18/12 0510 04/17/12 2015  NA 136 137 139 139  K 4.2 3.5 3.5 3.8  CL 98 101 100 99  CO2 26 29 28 25   GLUCOSE 81 74 192* 246*  BUN 31* 25* 19 20  CREATININE 3.05* 2.23* 1.76* 1.71*  ALB -- -- -- --  CALCIUM 8.1* 7.9* 8.4 8.7  PHOS -- -- -- --   Liver Function Tests:  Lab 04/17/12 2015  AST 34  ALT 20  ALKPHOS 60  BILITOT 0.6  PROT 7.3  ALBUMIN 3.6    Lab 04/17/12 2015  LIPASE 15  AMYLASE --   No results found for this basename: AMMONIA:3 in the last 168 hours CBC:  Lab 04/20/12 0450 04/19/12 0523 04/18/12 0510 04/17/12 2015  WBC 14.7* 6.5 6.7 7.7  NEUTROABS -- -- -- 5.5  HGB 13.5 12.7* 13.8 15.3  HCT 41.1 39.2 41.5 45.0  MCV 85.6 85.2 84.5 83.6  PLT 181 160 177 183   PT/INR: @labrcntip (inr:5) Cardiac Enzymes:  Lab 04/18/12 1614 04/18/12 0645 04/18/12 0010 04/17/12 2015  CKTOTAL 84 97 111 130  CKMB 1.6 2.2 2.6 3.0  CKMBINDEX -- -- -- --  TROPONINI <0.30 <0.30 0.37* 0.33*   CBG:  Lab 04/20/12 2130 04/20/12 1647 04/20/12 1212 04/20/12 0727 04/19/12 2121  GLUCAP 127* 120* 110* 81 90    Iron Studies: No results found for this basename: IRON:30,TIBC:30,TRANSFERRIN:30,FERRITIN:30 in the last 168 hours  Xrays/Other Studies: No results found.  Assessment/Recommendations 1. AKI, unclear etiology. Suspect hypoperfusion due to ACEI effect in setting of acute PE and transient relative hypotension.  No IV dye or NSAID's given.  +vol overload may also be contributing, vis-a-vis diastolic CHF. Will stop IVF"s, give one dose of IV lasix 80 mg, decrease BP  meds, let BP rise into systolic 140's.  2. CKD III, baseline creat 1.5-2.0, likely due to HTN longstanding. Has DM 2 but no retinopathy hx and no significant proteinuria.  3. Acute PE with syncope 4. Volume excess 5. Joint pains, felt to be gout flare, on steroids po 6. DM 2 7. HTN 8. OBesity 9. Hx diast CHF  Thanks for calling, we will  follow.   Vinson Moselle  MD [facility] [phone] pgr    [phone] cell 04/20/2012, 11:12 PM

## 2012-04-21 LAB — GLUCOSE, CAPILLARY
Glucose-Capillary: 197 mg/dL — ABNORMAL HIGH (ref 70–99)
Glucose-Capillary: 202 mg/dL — ABNORMAL HIGH (ref 70–99)
Glucose-Capillary: 201 mg/dL — ABNORMAL HIGH (ref 70–99)
Glucose-Capillary: 207 mg/dL — ABNORMAL HIGH (ref 70–99)

## 2012-04-21 LAB — HEPATIC FUNCTION PANEL
ALT: 11 U/L (ref 0–53)
AST: 18 U/L (ref 0–37)
Albumin: 2.7 g/dL — ABNORMAL LOW (ref 3.5–5.2)
Alkaline Phosphatase: 59 U/L (ref 39–117)
Bilirubin, Direct: 0.2 mg/dL (ref 0.0–0.3)
Indirect Bilirubin: 0.2 mg/dL — ABNORMAL LOW (ref 0.3–0.9)
Total Bilirubin: 0.4 mg/dL (ref 0.3–1.2)
Total Protein: 6.8 g/dL (ref 6.0–8.3)

## 2012-04-21 LAB — CBC
HCT: 40.5 % (ref 39.0–52.0)
Hemoglobin: 13.5 g/dL (ref 13.0–17.0)
MCH: 28.2 pg (ref 26.0–34.0)
MCHC: 33.3 g/dL (ref 30.0–36.0)
MCV: 84.6 fL (ref 78.0–100.0)
Platelets: 176 10*3/uL (ref 150–400)
RBC: 4.79 MIL/uL (ref 4.22–5.81)
RDW: 15.5 % (ref 11.5–15.5)
WBC: 12.8 10*3/uL — ABNORMAL HIGH (ref 4.0–10.5)

## 2012-04-21 LAB — BASIC METABOLIC PANEL
BUN: 49 mg/dL — ABNORMAL HIGH (ref 6–23)
CO2: 25 mEq/L (ref 19–32)
Calcium: 7.9 mg/dL — ABNORMAL LOW (ref 8.4–10.5)
Chloride: 93 mEq/L — ABNORMAL LOW (ref 96–112)
Creatinine, Ser: 2.84 mg/dL — ABNORMAL HIGH (ref 0.50–1.35)
GFR calc Af Amer: 27 mL/min — ABNORMAL LOW (ref >90)
GFR calc non Af Amer: 23 mL/min — ABNORMAL LOW (ref >90)
Glucose, Bld: 180 mg/dL — ABNORMAL HIGH (ref 70–99)
Potassium: 4.5 mEq/L (ref 3.5–5.1)
Sodium: 134 mEq/L — ABNORMAL LOW (ref 135–145)

## 2012-04-21 LAB — CREATININE, URINE, RANDOM: Creatinine, Urine: 282.5 mg/dL

## 2012-04-21 LAB — PROTIME-INR
INR: 1.46 (ref 0.00–1.49)
Prothrombin Time: 18 seconds — ABNORMAL HIGH (ref 11.6–15.2)

## 2012-04-21 LAB — SODIUM, URINE, RANDOM: Sodium, Ur: 52 mEq/L

## 2012-04-21 LAB — URIC ACID, RANDOM URINE: Uric Acid, Urine: 26.5 mg/dL

## 2012-04-21 MED ORDER — PANTOPRAZOLE SODIUM 40 MG PO TBEC
40.0000 mg | DELAYED_RELEASE_TABLET | Freq: Every day | ORAL | Status: DC
  Administered 2012-04-21 – 2012-04-24 (×4): 40 mg via ORAL
  Filled 2012-04-21 (×4): qty 1

## 2012-04-21 MED ORDER — INSULIN NPH (HUMAN) (ISOPHANE) 100 UNIT/ML ~~LOC~~ SUSP
45.0000 [IU] | Freq: Two times a day (BID) | SUBCUTANEOUS | Status: DC
  Administered 2012-04-21 (×2): 45 [IU] via SUBCUTANEOUS
  Filled 2012-04-21: qty 10

## 2012-04-21 MED ORDER — WARFARIN SODIUM 7.5 MG PO TABS
7.5000 mg | ORAL_TABLET | Freq: Once | ORAL | Status: AC
  Administered 2012-04-21: 7.5 mg via ORAL
  Filled 2012-04-21: qty 1

## 2012-04-21 MED ORDER — FUROSEMIDE 10 MG/ML IJ SOLN
80.0000 mg | Freq: Two times a day (BID) | INTRAMUSCULAR | Status: DC
  Administered 2012-04-21 – 2012-04-23 (×4): 80 mg via INTRAVENOUS
  Filled 2012-04-21 (×6): qty 8

## 2012-04-21 NOTE — Progress Notes (Signed)
ANTICOAGULATION CONSULT NOTE - Follow Up Consult  Pharmacy Consult for Lovenox and Warfarin Indication: pulmonary embolus/bilateral DVT  Allergies  Allergen Reactions  . Ciprofloxacin Hives  . Levofloxacin (Levaquin) Hives    Patient Measurements: Height: 6' 0.83" (185 cm) Weight: 359 lb 5.6 oz (163 kg) IBW/kg (Calculated) : 79.52   Vital Signs: Temp: 97.8 F (36.6 C) (04/27 1308) Temp src: Oral (04/27 1308) BP: 112/77 mmHg (04/27 1308) Pulse Rate: 90  (04/27 1308)  Labs:  Basename 04/21/12 0700 04/20/12 0450 04/19/12 0523 04/18/12 1614  HGB 13.5 13.5 -- --  HCT 40.5 41.1 39.2 --  PLT 176 181 160 --  APTT -- -- -- --  LABPROT 18.0* 15.8* 13.0 --  INR 1.46 1.23 0.96 --  HEPARINUNFRC -- -- -- --  CREATININE 2.84* 3.05* 2.23* --  CKTOTAL -- -- -- 84  CKMB -- -- -- 1.6  TROPONINI -- -- -- <0.30   Estimated Creatinine Clearance: 45.3 ml/min (by C-G formula based on Cr of 2.84).   Medications:  Scheduled:     . aspirin EC  325 mg Oral Daily  . atorvastatin  20 mg Oral q1800  . calcitRIOL  0.25 mcg Oral Q breakfast  . carvedilol  12.5 mg Oral BID WC  . cefUROXime  250 mg Oral BID WC  . colchicine  0.6 mg Oral Daily  . enoxaparin (LOVENOX) injection  150 mg Subcutaneous Q24H  . furosemide  80 mg Intravenous Once  . gabapentin  300 mg Oral BID  . hydroxychloroquine  200 mg Oral Daily  . insulin aspart  25-50 Units Subcutaneous TID WC  . insulin NPH  45 Units Subcutaneous BID  . metoCLOPramide  5 mg Oral TID AC & HS  . pantoprazole  40 mg Oral Q1200  . predniSONE  40 mg Oral BID WC  . senna  1 tablet Oral BID  . sodium chloride  3 mL Intravenous Q12H  . warfarin  10 mg Oral ONCE-1800  . Warfarin - Pharmacist Dosing Inpatient   Does not apply q1800  . DISCONTD: amLODipine  2.5 mg Oral Daily  . DISCONTD: carvedilol  25 mg Oral BID WC  . DISCONTD: insulin NPH  90 Units Subcutaneous BID    Assessment:  61 yo M with PE and bilateral DVT's on Day 4/5  lovenox/coumadin overalp.   INR trending toward goal after 3 doses of 10 mg.  Pt. experiencing significant N/V with no PO intake today.  Will give smaller dose tonight in anticipation of INR continuing to trend up.   Chronic renal insufficiency with current Scr improved CrCl 45 ml/min  Goal of Therapy:  INR 2-3 Anti-Xa level (LMWH) 0.6-1 units/ml   Plan: 1.) Coumadin 7.5 mg po x 1 tonight at 1800 2.) Continue lovenox 150 mg Q24 hr and monitor Scr, check LMWH as needed. 3.) Continue lovenox/coumadin overlap for at least 5 days and until INR therapeutic x 24 hours.  4.) Follow daily PT/INR  [name], Loma Messing PharmD 1:53 PM 04/21/2012

## 2012-04-21 NOTE — Progress Notes (Addendum)
PCP: HASANAJ,XAJE A, MD, MD  Brief HPI:  Mitchell Parker with numerous medical problems including non-ischemic CHF but now with recovered EF 60-65%, COPD, OSA/CPAP, morbid obesity, DM/insulin presents with 7yr h/o diarrhea, nausea, vomiting, dehydration, and sycnopal episode and found to have minimally elevated Trop, acutely elevated BNP, and abnormal ECG. Pt stated he's had 1 year of diarrhea, described as foul-smelling, yellow, liquidy but also with some solid, up to 3-4 times per day. It will come on for 3-4 days then go away for 3-4 days. Also with chronic nausea and vomiting 1-2 times per day. He was last admitted to [facility] 12/2011 with a one year h/o cholelithiasis, had scheduled an elective CCY but pain worsened so he came in acutely and was admitted, had lap chole, and was advised to f/u with GI, decision made to defer MRCP and only if recurrent pain. He states he's had persistent symptoms since this surgery. Day before admission he was mowing the lawn, finished and started having nausea, diarrhea. Went to pick his wife downtown [place], and when got back to home, when getting out the truck, he had sudden onset dizziness described more like presyncope, and acute SOB. He tried to walk it off, but when he got the top of the stairs, he dropped his keys, so bent over to pick them up. Next thing, he was waking up on the porch with his wife putting a cool rag on his head. He knew where he was immediately. No associated chest pain. He felt very dehydrated.   Past medical history:  Past Medical History   Diagnosis  Date   .  CHF (congestive heart failure)      H/o nonischemic CMP with EF 17% in 2003, however echo 09/2011 with EF 60-65%. Not ischemic by cath, no MI, stents, etc   .  Diabetes mellitus      On insulin   .  Neuropathy    .  Hypertension    .  COPD (chronic obstructive pulmonary disease)    .  Gallstones    .  RA (rheumatoid arthritis)    .  Obese    .  Kidney stones    .  Piriformis syndrome       left hip   .  Lumbar disc disease      compression   .  Sleep apnea    .  Gout      Consultants: Nephrology, [facility]  Procedures: None  Subjective: Patient feels nauseus. Unable to eat or drink anything. Elbow and shoulder still hurting though better. No diarrhea.  Objective: Vital signs in last 24 hours: Temp:  [98.9 F (37.2 C)-99.7 F (37.6 C)] 98.9 F (37.2 C) (04/27 0615) Pulse Rate:  [64-92] 92  (04/27 0615) Resp:  [20] 20  (04/27 0615) BP: (104-135)/(52-79) 135/79 mmHg (04/27 0615) SpO2:  [78 %-96 %] 93 % (04/27 0615) Weight:  [163 kg (359 lb 5.6 oz)] 163 kg (359 lb 5.6 oz) (04/27 0615) Weight change: -1.1 kg (-2 lb 6.8 oz) Last BM Date: 04/18/12  Intake/Output from previous day: 04/26 0701 - 04/27 0700 In: 820 [P.O.:480; I.V.:340] Out: 625 [Urine:625] Intake/Output this shift:    General appearance: alert, cooperative, no distress and morbidly obese Head: Normocephalic, without obvious abnormality, atraumatic Resp: clear to auscultation bilaterally Cardio: regular rate and rhythm, S1, S2 normal, no murmur, click, rub or gallop GI: soft, vague tenderness in upper abdomen. bowel sounds normal; no masses,  no organomegaly Extremities: edema 2+ Neurologic:  Grossly normal  Lab Results:  Basename 04/21/12 0700 04/20/12 0450  WBC 12.8* 14.7*  HGB 13.5 13.5  HCT 40.5 41.1  PLT 176 181   BMET  Basename 04/21/12 0700 04/20/12 0450  NA 134* 136  K 4.5 4.2  CL 93* 98  CO2 25 26  GLUCOSE 180* 81  BUN 49* 31*  CREATININE 2.84* 3.05*  CALCIUM 7.9* 8.1*  ALT -- --    Studies/Results: No results found.  Medications:  Scheduled:   . aspirin EC  325 mg Oral Daily  . atorvastatin  20 mg Oral q1800  . calcitRIOL  0.25 mcg Oral Q breakfast  . carvedilol  12.5 mg Oral BID WC  . cefUROXime  250 mg Oral BID WC  . colchicine  0.6 mg Oral Daily  . enoxaparin (LOVENOX) injection  150 mg Subcutaneous Q24H  . furosemide  80 mg Intravenous Once  .  gabapentin  300 mg Oral BID  . hydroxychloroquine  200 mg Oral Daily  . insulin aspart  25-50 Units Subcutaneous TID WC  . insulin NPH  45 Units Subcutaneous BID  . metoCLOPramide  5 mg Oral TID AC & HS  . predniSONE  40 mg Oral BID WC  . senna  1 tablet Oral BID  . sodium chloride  3 mL Intravenous Q12H  . warfarin  10 mg Oral ONCE-1800  . Warfarin - Pharmacist Dosing Inpatient   Does not apply q1800  . DISCONTD: amLODipine  2.5 mg Oral Daily  . DISCONTD: carvedilol  25 mg Oral BID WC  . DISCONTD: insulin NPH  90 Units Subcutaneous BID    Assessment/Plan:  Principal Problem:  *Pulmonary embolism Active Problems:  Diabetes mellitus  Syncope and collapse  Dehydration  Chronic diarrhea  Nausea and vomiting  Elevated troponin  Abnormal ECG  Chronic renal insufficiency  DVT (deep venous thrombosis)    Syncope/SOB/minimally elevated Trop VQ scan was high probability for PE. Venous dopplers show b/l DVT as well. This is the explanation for patient's presentation.   Acute PE and b/l DVT's Likely due to surgery recently in January. Now on Lovenox and Warfarin. RA sats pending.   Acute on chronic renal insufficiency Creatinine better today. Appreciate Nephrology input. Continue Strict I&O's, hold nephrotoxins. Received Lasix. Monitor closely   UTI E coli. Sensitivities pending. On Ceftin.   Acute Gout Flare of Left Elbow and shoulder Some improvement. Continue steroids. Continue Colchicine and dilaudid for pain control. Unable to give NSAIDS due to renal insufficiency.  Leukocytosis/fever Fever improved. Most likely related to UTI, but could also be from Gout and DVT's. Blood cultures done. Monitor closely   Chronic nausea, vomiting, and diarrhea His nausea persists so much so that he is unable to eat or drink. Etiology remains unclear. Differential include choledocholithiasis considering his recent history, gastroparesis. Will check LFT's though these were normal on 4/23.  Will initiate PPI though he doesn't really mention heartburn. Consider GI consult sooner than later especially due to anticoagulation issues if a procedure is planned. Discussed with Dr. Dulce Sellar.  DM2 Due to poor PO intake, will decrease dose of NPH. Patient also on steroids and will likely require close monitoring and adjusting of doses.    Chronic CHF (diastolic) Normal systolic function. Stable.  Disposition: Home when medically stable.     LOS: 4 days   [facility]  [facility] Pager [phone] 04/21/2012, 11:24 AM

## 2012-04-21 NOTE — Progress Notes (Signed)
Pt unable to void, bladder scanned at 0045- 245cc in bladder, inserted foley and gave 80mg  IV lasix per MD orders. UOP this morning 475cc for my 12 hour shift. Mishawn Didion, Ok Edwards RN

## 2012-04-21 NOTE — Progress Notes (Signed)
Subjective: Feels better, " i put out a lot of urine last night".  He says 1-2 urinals full today, but only 600 cc recorded. Foley placed for high residual on bladder scan last night.   Objective Vital signs in last 24 hours: Filed Vitals:   04/20/12 2130 04/20/12 2150 04/21/12 0615 04/21/12 1308  BP: 104/73  135/79 112/77  Pulse: 91  92 90  Temp: 99.1 F (37.3 C)  98.9 F (37.2 C) 97.8 F (36.6 C)  TempSrc: Oral  Oral Oral  Resp: 20  20 22   Height:      Weight:   163 kg (359 lb 5.6 oz)   SpO2: 78% 90% 93% 91%   Weight change: -1.1 kg (-2 lb 6.8 oz)  Intake/Output Summary (Last 24 hours) at 04/21/12 2104 Last data filed at 04/21/12 1808  Gross per 24 hour  Intake    680 ml  Output   1075 ml  Net   -395 ml   Labs: Basic Metabolic Panel:  Lab 04/21/12 1610 04/20/12 0450 04/19/12 0523 04/18/12 0510 04/17/12 2015  NA 134* 136 137 139 139  K 4.5 4.2 3.5 3.5 3.8  CL 93* 98 101 100 99  CO2 25 26 29 28 25   GLUCOSE 180* 81 74 192* 246*  BUN 49* 31* 25* 19 20  CREATININE 2.84* 3.05* 2.23* 1.76* 1.71*  ALB -- -- -- -- --  CALCIUM 7.9* 8.1* 7.9* 8.4 8.7  PHOS -- -- -- -- --   Liver Function Tests:  Lab 04/21/12 0700 04/17/12 2015  AST 18 34  ALT 11 20  ALKPHOS 59 60  BILITOT 0.4 0.6  PROT 6.8 7.3  ALBUMIN 2.7* 3.6    Lab 04/17/12 2015  LIPASE 15  AMYLASE --   No results found for this basename: AMMONIA:3 in the last 168 hours CBC:  Lab 04/21/12 0700 04/20/12 0450 04/19/12 0523 04/18/12 0510 04/17/12 2015  WBC 12.8* 14.7* 6.5 6.7 --  NEUTROABS -- -- -- -- 5.5  HGB 13.5 13.5 12.7* 13.8 --  HCT 40.5 41.1 39.2 41.5 --  MCV 84.6 85.6 85.2 84.5 --  PLT 176 181 160 177 --   PT/INR: @labrcntip (inr:5) Cardiac Enzymes:  Lab 04/18/12 1614 04/18/12 0645 04/18/12 0010 04/17/12 2015  CKTOTAL 84 97 111 130  CKMB 1.6 2.2 2.6 3.0  CKMBINDEX -- -- -- --  TROPONINI <0.30 <0.30 0.37* 0.33*   CBG:  Lab 04/21/12 1626 04/21/12 1141 04/21/12 0732 04/20/12 2130 04/20/12  1647  GLUCAP 201* 202* 197* 127* 120*    Iron Studies: No results found for this basename: IRON:30,TIBC:30,TRANSFERRIN:30,FERRITIN:30 in the last 168 hours Studies/Results: No results found. Medications:    . sodium chloride 10 mL/hr at 04/21/12 0030      . aspirin EC  325 mg Oral Daily  . atorvastatin  20 mg Oral q1800  . calcitRIOL  0.25 mcg Oral Q breakfast  . carvedilol  12.5 mg Oral BID WC  . cefUROXime  250 mg Oral BID WC  . colchicine  0.6 mg Oral Daily  . enoxaparin (LOVENOX) injection  150 mg Subcutaneous Q24H  . furosemide  80 mg Intravenous Once  . gabapentin  300 mg Oral BID  . hydroxychloroquine  200 mg Oral Daily  . insulin aspart  25-50 Units Subcutaneous TID WC  . insulin NPH  45 Units Subcutaneous BID  . metoCLOPramide  5 mg Oral TID AC & HS  . pantoprazole  40 mg Oral Q1200  . predniSONE  40  mg Oral BID WC  . senna  1 tablet Oral BID  . sodium chloride  3 mL Intravenous Q12H  . warfarin  7.5 mg Oral ONCE-1800  . Warfarin - Pharmacist Dosing Inpatient   Does not apply q1800  . DISCONTD: insulin NPH  90 Units Subcutaneous BID    I  have reviewed scheduled and prn medications.  Physical Exam:  Blood pressure 112/77, pulse 90, temperature 97.8 F (36.6 C), temperature source Oral, resp. rate 22, height 6' 0.84" (1.85 m), weight 163 kg (359 lb 5.6 oz), SpO2 91.00%.  Gen: obese WM, looks better, much less swelling in face and arms Skin: no rash, cyanosis  Neck: no JVD, bruits or LAN  Chest: CTA to the bases, no rales or wheezing  Heart: regular, no rub or gallop  Abdomen: soft, markedly obese, dec'd BS, no ascites or HSM  Ext: brawny skin changes bilat LE > UE edema, improving Neuro: alert, Ox3, no focal deficit  Heme/Lymph: no bruising or LAN  Weight - 155 > 164, down to 163 today.   Assessment/Recommendations  1. AKI, unclear etiology. Improving. Hypoperfusion due to ACEI effect in setting of acute PE, relative hypotension and diastolic HF, OR,  bladder outlet obstruction another possibility - had 245cc on post void bladder scan last night- foley placed and UOP improving (also rec'd IV lasix 80 mg x 1).  Will renew order for IV lasix 80 bid, still vol overloaded, keep foley in. 2. CKD III, baseline creat 1.5-2.0, likely due to HTN longstanding. Has DM 2 but no retinopathy hx and no significant proteinuria.  3. Acute PE with syncope 4. Volume excess 5. Joint pains, felt to be gout flare, on steroids po 6. DM 2 7. HTN 8. OBesity 9. Hx diast CHF Vinson Moselle  MD [facility] [phone] pgr    [phone] cell 04/21/2012, 9:04 PM

## 2012-04-21 NOTE — Consult Note (Signed)
[facility] Consultation Note  Referring Provider: Dr. Osvaldo Shipper [facility]) Primary Care Physician:  Toma Deiters, MD, MD Primary Gastroenterologist:  Dr.  Jaquita Rector for Consultation:  Nausea, vomiting  HPI: Mitchell Parker. is a 61 y.o. male admitted for acute shortness of breath and syncope.  Was found to have pulmonary embolism on V/Q study and has been started on anticoagulation.  We were asked to see patient re: nausea and vomiting.  For the past one year, patient has had intermittent nausea and vomiting.  Will have weeks where he will feel ok, followed by recurrent nausea with multiple episodes of retching, which can extend to period of several days at a time.  Has some early satiety.  Has lost about 20 lbs over the course of his symptoms.  Has history of gallstones, and underwent cholecystectomy, which did not improve his nausea and vomiting symptoms.  No blood in stool.  Has intermittent diarrhea.  No prior endoscopy.  Colonoscopy done a couple years ago, elsewhere, showed one polyp per patient.   Past Medical History  Diagnosis Date  . CHF (congestive heart failure)     H/o nonischemic CMP with EF 17% in 2003, however echo 09/2011 with EF 60-65%. Not ischemic by cath, no MI, stents, etc  . Diabetes mellitus     On insulin  . Neuropathy   . Hypertension   . COPD (chronic obstructive pulmonary disease)   . Gallstones   . RA (rheumatoid arthritis)   . Obese   . Kidney stones   . Piriformis syndrome     left hip  . Lumbar disc disease     compression   . Sleep apnea   . Gout     Past Surgical History  Procedure Date  . Umbilical hernia repair   . Pilonidal cyst excision 1997  . Varicose vein surgery     left leg  . Cholecystectomy 01/18/2012    Procedure: LAPAROSCOPIC CHOLECYSTECTOMY WITH INTRAOPERATIVE CHOLANGIOGRAM;  Surgeon: Clovis Pu. Cornett, MD;  Location: [facility];  Service: General;  Laterality: N/A;    Prior to Admission medications   Medication Sig  Start Date End Date Taking? Authorizing Provider  albuterol-ipratropium (COMBIVENT) 18-103 MCG/ACT inhaler Inhale 2 puffs into the lungs every 6 (six) hours as needed. Wheezing   Yes Historical Provider, MD  amlodipine-benazepril (LOTREL) 2.5-10 MG per capsule Take 1 capsule by mouth daily.   Yes Historical Provider, MD  aspirin 81 MG tablet Take 81 mg by mouth 2 (two) times daily.    Yes Historical Provider, MD  calcitRIOL (ROCALTROL) 0.25 MCG capsule Take 0.25 mcg by mouth daily with breakfast.    Yes Historical Provider, MD  carvedilol (COREG) 25 MG tablet Take 25 mg by mouth 2 (two) times daily with a meal.     Yes Historical Provider, MD  furosemide (LASIX) 80 MG tablet Take 80 mg by mouth 2 (two) times daily.     Yes Historical Provider, MD  gabapentin (NEURONTIN) 300 MG capsule Take 300 mg by mouth 2 (two) times daily.     Yes Historical Provider, MD  HYDROcodone-acetaminophen (NORCO) 7.5-325 MG per tablet Take 1 tablet by mouth every 8 (eight) hours as needed for pain. Pain 01/21/12  Yes Dorothea Ogle, MD  HYDROXYCHLOROQUINE SULFATE PO Take 300 mg by mouth 2 (two) times daily.   Yes Historical Provider, MD  insulin aspart (NOVOLOG) 100 UNIT/ML injection Inject 50 Units into the skin 3 (three) times daily before meals.   Yes  Historical Provider, MD  insulin NPH (HUMULIN N,NOVOLIN N) 100 UNIT/ML injection Inject 90 Units into the skin 2 (two) times daily.     Yes Historical Provider, MD  lisinopril (PRINIVIL,ZESTRIL) 10 MG tablet Take 10 mg by mouth 2 (two) times daily. Take 2 tablets in the morning and 1 tablet in the evening   Yes Historical Provider, MD  omeprazole (PRILOSEC) 20 MG capsule Take 20 mg by mouth every morning.     Yes Historical Provider, MD  pravastatin (PRAVACHOL) 80 MG tablet Take 80 mg by mouth at bedtime.     Yes Historical Provider, MD  traMADol (ULTRAM) 50 MG tablet Take 50 mg by mouth every 8 (eight) hours as needed. Maximum dose= 8 tablets per day. Pain    Yes  Historical Provider, MD    Current Facility-Administered Medications  Medication Dose Route Frequency Provider Last Rate Last Dose  . 0.45 % sodium chloride infusion   Intravenous Continuous Maree Krabbe, MD 10 mL/hr at 04/21/12 0030    . acetaminophen (TYLENOL) tablet 650 mg  650 mg Oral Q6H PRN Devonne Doughty, MD   650 mg at 04/18/12 2014   Or  . acetaminophen (TYLENOL) suppository 650 mg  650 mg Rectal Q6H PRN Devonne Doughty, MD      . albuterol-ipratropium (COMBIVENT) inhaler 2 puff  2 puff Inhalation Q6H PRN Devonne Doughty, MD      . aspirin EC tablet 325 mg  325 mg Oral Daily Devonne Doughty, MD   325 mg at 04/21/12 1028  . atorvastatin (LIPITOR) tablet 20 mg  20 mg Oral q1800 Christine E Shade, PHARMD   20 mg at 04/20/12 1718  . calcitRIOL (ROCALTROL) capsule 0.25 mcg  0.25 mcg Oral Q breakfast Devonne Doughty, MD   0.25 mcg at 04/21/12 1027  . carvedilol (COREG) tablet 12.5 mg  12.5 mg Oral BID WC Maree Krabbe, MD   12.5 mg at 04/21/12 1028  . cefUROXime (CEFTIN) tablet 250 mg  250 mg Oral BID WC Osvaldo Shipper, MD   250 mg at 04/21/12 1028  . colchicine tablet 0.6 mg  0.6 mg Oral Daily Lesle Chris Black, NP   0.6 mg at 04/21/12 1027  . enoxaparin (LOVENOX) injection 150 mg  150 mg Subcutaneous Q24H Christine E Shade, PHARMD   150 mg at 04/20/12 1718  . furosemide (LASIX) injection 80 mg  80 mg Intravenous Once Maree Krabbe, MD   80 mg at 04/21/12 0042  . gabapentin (NEURONTIN) capsule 300 mg  300 mg Oral BID Devonne Doughty, MD   300 mg at 04/21/12 1028  . HYDROmorphone (DILAUDID) injection 1 mg  1 mg Intravenous Q3H PRN Gwenyth Bender, NP   1 mg at 04/21/12 1203  . hydroxychloroquine (PLAQUENIL) tablet 200 mg  200 mg Oral Daily Devonne Doughty, MD   200 mg at 04/21/12 1028  . insulin aspart (novoLOG) injection 25-50 Units  25-50 Units Subcutaneous TID WC Devonne Doughty, MD   30 Units at 04/19/12 1205  . insulin NPH (HUMULIN N,NOVOLIN N) injection 45 Units  45 Units Subcutaneous BID Osvaldo Shipper,  MD   45 Units at 04/21/12 1026  . metoCLOPramide (REGLAN) tablet 5 mg  5 mg Oral TID AC & HS Osvaldo Shipper, MD   5 mg at 04/21/12 1203  . ondansetron (ZOFRAN) tablet 4 mg  4 mg Oral Q6H PRN Devonne Doughty, MD       Or  .  ondansetron (ZOFRAN) injection 4 mg  4 mg Intravenous Q6H PRN Devonne Doughty, MD   4 mg at 04/21/12 1203  . pantoprazole (PROTONIX) EC tablet 40 mg  40 mg Oral Q1200 Osvaldo Shipper, MD   40 mg at 04/21/12 1203  . predniSONE (DELTASONE) tablet 40 mg  40 mg Oral BID WC Osvaldo Shipper, MD   40 mg at 04/21/12 1027  . promethazine (PHENERGAN) injection 12.5 mg  12.5 mg Intravenous Q6H PRN Gwenyth Bender, NP   12.5 mg at 04/21/12 0550  . senna (SENOKOT) tablet 8.6 mg  1 tablet Oral BID Devonne Doughty, MD   8.6 mg at 04/20/12 1029  . sodium chloride 0.9 % injection 3 mL  3 mL Intravenous Q12H Devonne Doughty, MD   3 mL at 04/21/12 1026  . warfarin (COUMADIN) tablet 10 mg  10 mg Oral ONCE-1800 Christine E Shade, PHARMD   10 mg at 04/20/12 1718  . Warfarin - Pharmacist Dosing Inpatient   Does not apply [address] [facility] [place], [place]      . DISCONTD: amLODipine (NORVASC) tablet 2.5 mg  2.5 mg Oral Daily Devonne Doughty, MD   2.5 mg at 04/20/12 1020  . DISCONTD: carvedilol (COREG) tablet 25 mg  25 mg Oral BID WC Devonne Doughty, MD   25 mg at 04/20/12 1016  . DISCONTD: HYDROmorphone (DILAUDID) injection 1 mg  1 mg Intravenous Q2H PRN Gwenyth Bender, NP   1 mg at 04/20/12 1439  . DISCONTD: insulin NPH (HUMULIN N,NOVOLIN N) injection 90 Units  90 Units Subcutaneous BID Devonne Doughty, MD   90 Units at 04/19/12 2134    Allergies as of 04/17/2012 - Review Complete 04/17/2012  Allergen Reaction Noted  . Ciprofloxacin Hives 10/26/2011  . Levofloxacin (levaquin) Hives 10/26/2011    Family History  Problem Relation Age of Onset  . Hypertension Mother   . Diabetes Father   . Diabetes Brother     History   Social History  . Marital Status: Married    Spouse Name: N/A    Number of Children: N/A  .  Years of Education: N/A   Occupational History  . Not on file.   Social History Main Topics  . Smoking status: Former Smoker    Quit date: 12/26/1994  . Smokeless tobacco: Never Used  . Alcohol Use: No  . Drug Use: No  . Sexually Active:    Other Topics Concern  . Not on file   Social History Narrative   Lives at home with wife, and has two adult children. Still ambulatory, no cane or walker.     Review of Systems: Positive for syncope, orthopnea, PND, peripheral edema, anorexia, fatigue, weakness, malaise, involuntary weight loss,  joint pain, swelling, dyspnea, cough, sputum, wheezing, DM, dizziness Gen: Denies any fever, chills, rigors, night sweats, , and sleep disorder CV: Denies chest pain, angina, palpitations, , and claudication. Resp: Denies , coughing up blood. GI: Described in detail in HPI.    GU : Denies urinary burning, blood in urine, urinary frequency, urinary hesitancy, nocturnal urination, and urinary incontinence. MS: Denies.  Denies muscle weakness, cramps, atrophy.  Derm: Denies rash, itching, oral ulcerations, hives, unhealing ulcers.  Psych: Denies depression, anxiety, memory loss, suicidal ideation, hallucinations,  and confusion. Heme: Denies bruising, bleeding, and enlarged lymph nodes. Neuro:  Denies any headaches, , paresthesias. Endo:  Denies any problems with , thyroid, adrenal function.  Physical Exam: Vital signs in last 24 hours:  Temp:  [98.9 F (37.2 C)-99.7 F (37.6 C)] 98.9 F (37.2 C) (04/27 0615) Pulse Rate:  [64-92] 92  (04/27 0615) Resp:  [20] 20  (04/27 0615) BP: (104-135)/(52-79) 135/79 mmHg (04/27 0615) SpO2:  [78 %-96 %] 93 % (04/27 0615) Weight:  [163 kg (359 lb 5.6 oz)] 163 kg (359 lb 5.6 oz) (04/27 0615) Last BM Date: 04/18/12 General:   Obese, older appearing than stated age Head:  Normocephalic and atraumatic. Eyes:  Sclera clear, no icterus.   Conjunctiva pink. Ears:  Normal auditory acuity. Nose:  No deformity,  discharge,  or lesions. Mouth:  No deformity or lesions.  Oropharynx pink & moist. Neck:  Thick Lungs:  Diffusely diminished.   No wheezes, crackles, or rhonchi. No acute distress. Heart:  Regular rate and rhythm; no murmurs, clicks, rubs,  or gallops. Abdomen:  Obese, protuberant, decreased but present bowel sounds, No masses, hepatosplenomegaly or hernias noted. No peritonitis   Msk:  Gouty discomfort Pulses:  Normal pulses noted. Extremities:  Edema bilaterally Neurologic:  Diffusely weak, but non-focal without lateralizing sings Skin:  Intact without significant lesions or rashes. Psych:  Alert and cooperative. Normal mood and affect.  Lab Results:  Basename 04/21/12 0700 04/20/12 0450 04/19/12 0523  WBC 12.8* 14.7* 6.5  HGB 13.5 13.5 12.7*  HCT 40.5 41.1 39.2  PLT 176 181 160   BMET  Basename 04/21/12 0700 04/20/12 0450 04/19/12 0523  NA 134* 136 137  K 4.5 4.2 3.5  CL 93* 98 101  CO2 25 26 29   GLUCOSE 180* 81 74  BUN 49* 31* 25*  CREATININE 2.84* 3.05* 2.23*  CALCIUM 7.9* 8.1* 7.9*   LFT No results found for this basename: PROT,ALBUMIN,AST,ALT,ALKPHOS,BILITOT,BILIDIR,IBILI in the last 72 hours PT/INR  Basename 04/21/12 0700 04/20/12 0450  LABPROT 18.0* 15.8*  INR 1.46 1.23   Impression:  1.  Nausea + vomiting.  Likely multifactorial (chronic medications, gastroparesis, GERD). 2.  Early satiety.  Raises specter for gastroparesis. 3.  Acute pulmonary embolism.  Plan:  1.  Supportive care with antiemetics as needed. 2.  Agree with PPI. 3.  Gastric Emptying study Monday. 4.  Risks of endoscopy far outweigh benefits at this time, but could consider at some point in the future. 5.  Will follow.  Thank you for the consult.   LOS: 4 days   Eufemio Strahm M  04/21/2012, 12:15 PM

## 2012-04-22 LAB — GLUCOSE, CAPILLARY
Glucose-Capillary: 250 mg/dL — ABNORMAL HIGH (ref 70–99)
Glucose-Capillary: 222 mg/dL — ABNORMAL HIGH (ref 70–99)
Glucose-Capillary: 178 mg/dL — ABNORMAL HIGH (ref 70–99)
Glucose-Capillary: 132 mg/dL — ABNORMAL HIGH (ref 70–99)

## 2012-04-22 LAB — HEPARIN ANTI-XA: Heparin LMW: 0.98 IU/mL

## 2012-04-22 LAB — COMPREHENSIVE METABOLIC PANEL
ALT: 9 U/L (ref 0–53)
AST: 17 U/L (ref 0–37)
Albumin: 2.7 g/dL — ABNORMAL LOW (ref 3.5–5.2)
Alkaline Phosphatase: 55 U/L (ref 39–117)
BUN: 59 mg/dL — ABNORMAL HIGH (ref 6–23)
CO2: 25 mEq/L (ref 19–32)
Calcium: 8 mg/dL — ABNORMAL LOW (ref 8.4–10.5)
Chloride: 95 mEq/L — ABNORMAL LOW (ref 96–112)
Creatinine, Ser: 2.24 mg/dL — ABNORMAL HIGH (ref 0.50–1.35)
GFR calc Af Amer: 35 mL/min — ABNORMAL LOW (ref >90)
GFR calc non Af Amer: 31 mL/min — ABNORMAL LOW (ref >90)
Glucose, Bld: 210 mg/dL — ABNORMAL HIGH (ref 70–99)
Potassium: 4.8 mEq/L (ref 3.5–5.1)
Sodium: 131 mEq/L — ABNORMAL LOW (ref 135–145)
Total Bilirubin: 0.3 mg/dL (ref 0.3–1.2)
Total Protein: 6.8 g/dL (ref 6.0–8.3)

## 2012-04-22 LAB — CBC
HCT: 38.7 % — ABNORMAL LOW (ref 39.0–52.0)
Hemoglobin: 12.8 g/dL — ABNORMAL LOW (ref 13.0–17.0)
MCH: 27.8 pg (ref 26.0–34.0)
MCHC: 33.1 g/dL (ref 30.0–36.0)
MCV: 83.9 fL (ref 78.0–100.0)
Platelets: 209 10*3/uL (ref 150–400)
RBC: 4.61 MIL/uL (ref 4.22–5.81)
RDW: 15.1 % (ref 11.5–15.5)
WBC: 11.2 10*3/uL — ABNORMAL HIGH (ref 4.0–10.5)

## 2012-04-22 LAB — PROTIME-INR
INR: 1.4 (ref 0.00–1.49)
Prothrombin Time: 17.4 seconds — ABNORMAL HIGH (ref 11.6–15.2)

## 2012-04-22 MED ORDER — INSULIN NPH (HUMAN) (ISOPHANE) 100 UNIT/ML ~~LOC~~ SUSP
55.0000 [IU] | Freq: Two times a day (BID) | SUBCUTANEOUS | Status: DC
  Administered 2012-04-22: 30 [IU] via SUBCUTANEOUS
  Administered 2012-04-22 – 2012-04-24 (×4): 55 [IU] via SUBCUTANEOUS

## 2012-04-22 MED ORDER — WARFARIN SODIUM 2.5 MG PO TABS
12.5000 mg | ORAL_TABLET | Freq: Once | ORAL | Status: AC
  Administered 2012-04-22: 12.5 mg via ORAL
  Filled 2012-04-22: qty 1

## 2012-04-22 NOTE — Progress Notes (Signed)
Subjective: Feeling better, got OOB this morning. UOP much better , 1800 so far today.  Objective Vital signs in last 24 hours: Filed Vitals:   04/21/12 1308 04/21/12 2107 04/22/12 0650 04/22/12 1452  BP: 112/77 146/75 126/74 135/76  Pulse: 90 86 84 74  Temp: 97.8 F (36.6 C) 99.3 F (37.4 C) 98.2 F (36.8 C) 98.5 F (36.9 C)  TempSrc: Oral Oral Oral Oral  Resp: 22 20 20 20   Height:      Weight:   159.1 kg (350 lb 12 oz)   SpO2: 91% 96% 98% 95%   Weight change: -3.9 kg (-8 lb 9.6 oz)  Intake/Output Summary (Last 24 hours) at 04/22/12 1547 Last data filed at 04/22/12 1500  Gross per 24 hour  Intake   1050 ml  Output   2450 ml  Net  -1400 ml   Labs: Basic Metabolic Panel:  Lab 04/22/12 1610 04/21/12 0700 04/20/12 0450 04/19/12 0523 04/18/12 0510 04/17/12 2015  NA 131* 134* 136 137 139 139  K 4.8 4.5 4.2 3.5 3.5 3.8  CL 95* 93* 98 101 100 99  CO2 25 25 26 29 28 25   GLUCOSE 210* 180* 81 74 192* 246*  BUN 59* 49* 31* 25* 19 20  CREATININE 2.24* 2.84* 3.05* 2.23* 1.76* 1.71*  ALB -- -- -- -- -- --  CALCIUM 8.0* 7.9* 8.1* 7.9* 8.4 8.7  PHOS -- -- -- -- -- --   Liver Function Tests:  Lab 04/22/12 0525 04/21/12 0700 04/17/12 2015  AST 17 18 34  ALT 9 11 20   ALKPHOS 55 59 60  BILITOT 0.3 0.4 0.6  PROT 6.8 6.8 7.3  ALBUMIN 2.7* 2.7* 3.6    Lab 04/17/12 2015  LIPASE 15  AMYLASE --   No results found for this basename: AMMONIA:3 in the last 168 hours CBC:  Lab 04/22/12 0525 04/21/12 0700 04/20/12 0450 04/19/12 0523 04/17/12 2015  WBC 11.2* 12.8* 14.7* 6.5 --  NEUTROABS -- -- -- -- 5.5  HGB 12.8* 13.5 13.5 12.7* --  HCT 38.7* 40.5 41.1 39.2 --  MCV 83.9 84.6 85.6 85.2 --  PLT 209 176 181 160 --   PT/INR: @labrcntip (inr:5) Cardiac Enzymes:  Lab 04/18/12 1614 04/18/12 0645 04/18/12 0010 04/17/12 2015  CKTOTAL 84 97 111 130  CKMB 1.6 2.2 2.6 3.0  CKMBINDEX -- -- -- --  TROPONINI <0.30 <0.30 0.37* 0.33*   CBG:  Lab 04/22/12 1147 04/22/12 0728 04/21/12  2209 04/21/12 1626 04/21/12 1141  GLUCAP 222* 250* 207* 201* 202*    Iron Studies: No results found for this basename: IRON:30,TIBC:30,TRANSFERRIN:30,FERRITIN:30 in the last 168 hours Studies/Results: No results found. Medications:    . sodium chloride 20 mL/hr at 04/22/12 1500      . aspirin EC  325 mg Oral Daily  . atorvastatin  20 mg Oral q1800  . calcitRIOL  0.25 mcg Oral Q breakfast  . carvedilol  12.5 mg Oral BID WC  . cefUROXime  250 mg Oral BID WC  . colchicine  0.6 mg Oral Daily  . enoxaparin (LOVENOX) injection  150 mg Subcutaneous Q24H  . furosemide  80 mg Intravenous BID  . gabapentin  300 mg Oral BID  . hydroxychloroquine  200 mg Oral Daily  . insulin aspart  25-50 Units Subcutaneous TID WC  . insulin NPH  55 Units Subcutaneous BID  . metoCLOPramide  5 mg Oral TID AC & HS  . pantoprazole  40 mg Oral Q1200  . predniSONE  40  mg Oral BID WC  . senna  1 tablet Oral BID  . sodium chloride  3 mL Intravenous Q12H  . warfarin  12.5 mg Oral ONCE-1800  . warfarin  7.5 mg Oral ONCE-1800  . Warfarin - Pharmacist Dosing Inpatient   Does not apply q1800  . DISCONTD: insulin NPH  45 Units Subcutaneous BID    I  have reviewed scheduled and prn medications.  Physical Exam:  Blood pressure 135/76, pulse 74, temperature 98.5 F (36.9 C), temperature source Oral, resp. rate 20, height 6' 0.84" (1.85 m), weight 159.1 kg (350 lb 12 oz), SpO2 95.00%.  Gen: obese WM, looks better, much less swelling in face and arms Skin: no rash, cyanosis  Neck: no JVD, bruits or LAN  Chest: CTA to the bases, no rales or wheezing  Heart: regular, no rub or gallop  Abdomen: soft, markedly obese, dec'd BS, no ascites or HSM  Ext: brawny skin changes bilat LE > UE edema, improving Neuro: alert, Ox3, no focal deficit  Heme/Lymph: no bruising or LAN  Weight - 155 > 164, down to 163 today.   Assessment/Recommendations  1. AKI due to hypoperfusion from combination ACEI effect, acute PE, relative  hypotension and diastolic dysfunction. Also bladder outlet obstruction may have played a role. Renal function back to baseline and pt diuresing the extra volume. No further suggestions. Will sign off.  Please call as needed. I discussed with Dr. Rito Ehrlich, primary MD.  2. CKD III, baseline creat 1.5-2.0, likely due to HTN longstanding. Has DM 2 but no retinopathy hx and no significant proteinuria.  3. Acute PE with syncope 4. Volume excess 5. Joint pains, felt to be gout flare, on steroids po 6. DM 2 7. HTN 8. OBesity 9. Hx diast CHF Vinson Moselle  MD [facility] [phone] pgr    [phone] cell 04/22/2012, 3:47 PM

## 2012-04-22 NOTE — Progress Notes (Addendum)
ANTICOAGULATION CONSULT NOTE - Follow Up Consult  Pharmacy Consult for Lovenox and Warfarin Indication: pulmonary embolus/bilateral DVT  Allergies  Allergen Reactions  . Ciprofloxacin Hives  . Levofloxacin (Levaquin) Hives    Patient Measurements: Height: 6' 0.83" (185 cm) Weight: 350 lb 12 oz (159.1 kg) IBW/kg (Calculated) : 79.52   Vital Signs: Temp: 98.2 F (36.8 C) (04/28 0650) Temp src: Oral (04/28 0650) BP: 126/74 mmHg (04/28 0650) Pulse Rate: 84  (04/28 0650)  Labs:  Basename 04/22/12 0525 04/21/12 0700 04/20/12 0450  HGB 12.8* 13.5 --  HCT 38.7* 40.5 41.1  PLT 209 176 181  APTT -- -- --  LABPROT 17.4* 18.0* 15.8*  INR 1.40 1.46 1.23  HEPARINUNFRC -- -- --  CREATININE 2.24* 2.84* 3.05*  CKTOTAL -- -- --  CKMB -- -- --  TROPONINI -- -- --   Estimated Creatinine Clearance: 56.6 ml/min (by C-G formula based on Cr of 2.24).   Medications:  Scheduled:     . aspirin EC  325 mg Oral Daily  . atorvastatin  20 mg Oral q1800  . calcitRIOL  0.25 mcg Oral Q breakfast  . carvedilol  12.5 mg Oral BID WC  . cefUROXime  250 mg Oral BID WC  . colchicine  0.6 mg Oral Daily  . enoxaparin (LOVENOX) injection  150 mg Subcutaneous Q24H  . furosemide  80 mg Intravenous BID  . gabapentin  300 mg Oral BID  . hydroxychloroquine  200 mg Oral Daily  . insulin aspart  25-50 Units Subcutaneous TID WC  . insulin NPH  45 Units Subcutaneous BID  . metoCLOPramide  5 mg Oral TID AC & HS  . pantoprazole  40 mg Oral Q1200  . predniSONE  40 mg Oral BID WC  . senna  1 tablet Oral BID  . sodium chloride  3 mL Intravenous Q12H  . warfarin  7.5 mg Oral ONCE-1800  . Warfarin - Pharmacist Dosing Inpatient   Does not apply q1800  . DISCONTD: insulin NPH  Mitchell Units Subcutaneous BID    Assessment:  61 yo obese Parker with PE and bilateral DVT's on Day 5/5 lovenox/coumadin overalp.   INR not at goal and slight decrease from yesterday, will increase dose tonight.     Chronic renal  insufficiency with current Scr improved with Normalized CrCl remains ~ 36 ml/hr  LMWH previously elevated with q12h lovenox dosing secondary to acute on chronic renal failure and now on q24h hour dosing.  Will obtain a level at steady state today given some improvement in renal function.   Goal of Therapy:  INR 2-3 Anti-Xa level (LMWH) 0.6-1.2 units/ml   Plan: 1.) Coumadin 12.5 mg po x 1 tonight at 1800 2.) Continue lovenox 150 mg Q24 hr, Check LMWH level today at 2200 (4 hours after 1800 dose given) 3.) Continue lovenox/coumadin overlap for at least 5 days and until INR therapeutic x 24 hours.  4.) Follow daily PT/INR  [place]ger[place], Loma Messing PharmD 7:30 AM 04/22/2012   ADDENDUM:   LMWH level = 0.98 which was drawn at 22:23 (Goal 0.6-1.2).  Lovenox 150mg  dose charted as given @ 17:06.  True peak level likely slightly higher, as this level was drawn approx 5.5 hr after dose given  Continue Lovenox 150mg  sq q24h  Follow renal function.  Terrilee Files, PharmD 04/23/12 @ 00:08

## 2012-04-22 NOTE — Progress Notes (Signed)
PCP: HASANAJ,XAJE A, MD, MD  Brief HPI:  [id number] with numerous medical problems including non-ischemic CHF but now with recovered EF 60-65%, COPD, OSA/CPAP, morbid obesity, DM/insulin presents with 57yr h/o diarrhea, nausea, vomiting, dehydration, and sycnopal episode and found to have minimally elevated Trop, acutely elevated BNP, and abnormal ECG. Pt stated he's had 1 year of diarrhea, described as foul-smelling, yellow, liquidy but also with some solid, up to 3-4 times per day. It will come on for 3-4 days then go away for 3-4 days. Also with chronic nausea and vomiting 1-2 times per day. He was last admitted to [facility] 12/2011 with a one year h/o cholelithiasis, had scheduled an elective CCY but pain worsened so he came in acutely and was admitted, had lap chole, and was advised to f/u with GI, decision made to defer MRCP and only if recurrent pain. He states he's had persistent symptoms since this surgery. Day before admission he was mowing the lawn, finished and started having nausea, diarrhea. Went to pick his wife downtown [place], and when got back to home, when getting out the truck, he had sudden onset dizziness described more like presyncope, and acute SOB. He tried to walk it off, but when he got the top of the stairs, he dropped his keys, so bent over to pick them up. Next thing, he was waking up on the porch with his wife putting a cool rag on his head. He knew where he was immediately. No associated chest pain. He felt very dehydrated.   Past medical history:  Past Medical History   Diagnosis  Date   .  CHF (congestive heart failure)      H/o nonischemic CMP with EF 17% in 2003, however echo 09/2011 with EF 60-65%. Not ischemic by cath, no MI, stents, etc   .  Diabetes mellitus      On insulin   .  Neuropathy    .  Hypertension    .  COPD (chronic obstructive pulmonary disease)    .  Gallstones    .  RA (rheumatoid arthritis)    .  Obese    .  Kidney stones    .  Piriformis syndrome       left hip   .  Lumbar disc disease      compression   .  Sleep apnea    .  Gout      Consultants: Nephrology, [facility]  Procedures: None  Subjective: Patient feels better this morning. Trying to eat breakfast. Denies abdominal pain. Passing urine. Left elbow and shoulder feels much better. No BM in last 3 days.   Objective: Vital signs in last 24 hours: Temp:  [97.8 F (36.6 C)-99.3 F (37.4 C)] 98.2 F (36.8 C) (04/28 0650) Pulse Rate:  [84-90] 84  (04/28 0650) Resp:  [20-22] 20  (04/28 0650) BP: (112-146)/(74-77) 126/74 mmHg (04/28 0650) SpO2:  [91 %-98 %] 98 % (04/28 0650) Weight:  [159.1 kg (350 lb 12 oz)] 159.1 kg (350 lb 12 oz) (04/28 0650) Weight change: -3.9 kg (-8 lb 9.6 oz) Last BM Date: 04/20/12  Intake/Output from previous day: 04/27 0701 - 04/28 0700 In: 672 [P.O.:480; I.V.:180; IV Piggyback:12] Out: 1800 [Urine:1800] Intake/Output this shift:    General appearance: alert, cooperative, no distress and morbidly obese Head: Normocephalic, without obvious abnormality, atraumatic Resp: clear to auscultation bilaterally Cardio: regular rate and rhythm, S1, S2 normal, no murmur, click, rub or gallop GI: soft, non tender. bowel sounds normal;  no masses,  no organomegaly Extremities: edema 1+ Neurologic: Grossly normal Left Elbow: Not warm, much better ROM  Lab Results:  Basename 04/22/12 0525 04/21/12 0700  WBC 11.2* 12.8*  HGB 12.8* 13.5  HCT 38.7* 40.5  PLT 209 176   BMET  Basename 04/22/12 0525 04/21/12 0700  NA 131* 134*  K 4.8 4.5  CL 95* 93*  CO2 25 25  GLUCOSE 210* 180*  BUN 59* 49*  CREATININE 2.24* 2.84*  CALCIUM 8.0* 7.9*  ALT 9 11    Studies/Results: No results found.  Medications:  Scheduled:    . aspirin EC  325 mg Oral Daily  . atorvastatin  20 mg Oral q1800  . calcitRIOL  0.25 mcg Oral Q breakfast  . carvedilol  12.5 mg Oral BID WC  . cefUROXime  250 mg Oral BID WC  . colchicine  0.6 mg Oral Daily  . enoxaparin  (LOVENOX) injection  150 mg Subcutaneous Q24H  . furosemide  80 mg Intravenous BID  . gabapentin  300 mg Oral BID  . hydroxychloroquine  200 mg Oral Daily  . insulin aspart  25-50 Units Subcutaneous TID WC  . insulin NPH  45 Units Subcutaneous BID  . metoCLOPramide  5 mg Oral TID AC & HS  . pantoprazole  40 mg Oral Q1200  . predniSONE  40 mg Oral BID WC  . senna  1 tablet Oral BID  . sodium chloride  3 mL Intravenous Q12H  . warfarin  7.5 mg Oral ONCE-1800  . Warfarin - Pharmacist Dosing Inpatient   Does not apply q1800  . DISCONTD: insulin NPH  90 Units Subcutaneous BID    Assessment/Plan:  Principal Problem:  *Pulmonary embolism Active Problems:  Diabetes mellitus  Syncope and collapse  Dehydration  Chronic diarrhea  Nausea and vomiting  Elevated troponin  Abnormal ECG  Chronic renal insufficiency  DVT (deep venous thrombosis)    Syncope/SOB/minimally elevated Trop VQ scan was high probability for PE. Venous dopplers show b/l DVT as well. This is the explanation for patient's presentation.   Acute PE and b/l DVT's Likely due to surgery recently in January. Now on Lovenox and Warfarin. Check RA sats.   Acute on chronic renal insufficiency Creatinine continues to improve. On Lasix. Appreciate Nephrology input. Continue Strict I&O's, hold nephrotoxins.    UTI E coli. Sensitivities pending. On Ceftin.   Acute Gout Flare of Left Elbow and shoulder Much better. Able to move elbow and shoulder without difficulty. Continue steroids. Continue Colchicine. Dilaudid for pain control. Unable to give NSAIDS due to renal insufficiency.  Leukocytosis/fever Fever resolved. Most likely related to UTI, but could also be from Gout and DVT's. Blood cultures done. Monitor closely   Chronic nausea, vomiting, and diarrhea Some better today. Symptoms wax and wane. Appreciate GI input. Plan is for GE study on Monday. His nausea persists so much so that he is unable to eat or drink.  Etiology remains unclear. Differential include choledocholithiasis though unlikely considering normal LFT's. Continue PPI though he doesn't really mention heartburn.  DM2 Due to poor PO intake, had decreased dose of NPH yesterday. Will titrate up today as CBG's running high. Patient also on steroids and will likely require close monitoring and adjusting of doses.    Chronic CHF (diastolic) Normal systolic function. Evidence for fluid overload. Possibly from diastolic dysfunction. On Lasix per nephrology.   Disposition: Home when medically stable.     LOS: 5 days   [facility]  [facility] Pager [phone]  04/22/2012, 7:25 AM

## 2012-04-23 ENCOUNTER — Inpatient Hospital Stay (HOSPITAL_COMMUNITY): Payer: Medicare Other

## 2012-04-23 LAB — BASIC METABOLIC PANEL
BUN: 61 mg/dL — ABNORMAL HIGH (ref 6–23)
CO2: 27 mEq/L (ref 19–32)
Calcium: 8.1 mg/dL — ABNORMAL LOW (ref 8.4–10.5)
Chloride: 97 mEq/L (ref 96–112)
Creatinine, Ser: 1.83 mg/dL — ABNORMAL HIGH (ref 0.50–1.35)
GFR calc Af Amer: 45 mL/min — ABNORMAL LOW (ref >90)
GFR calc non Af Amer: 39 mL/min — ABNORMAL LOW (ref >90)
Glucose, Bld: 136 mg/dL — ABNORMAL HIGH (ref 70–99)
Potassium: 4.3 mEq/L (ref 3.5–5.1)
Sodium: 135 mEq/L (ref 135–145)

## 2012-04-23 LAB — URINE CULTURE
Colony Count: >=100000
Culture  Setup Time: 201304260632

## 2012-04-23 LAB — PROTIME-INR
INR: 1.58 — ABNORMAL HIGH (ref 0.00–1.49)
Prothrombin Time: 19.2 seconds — ABNORMAL HIGH (ref 11.6–15.2)

## 2012-04-23 LAB — GLUCOSE, CAPILLARY
Glucose-Capillary: 185 mg/dL — ABNORMAL HIGH (ref 70–99)
Glucose-Capillary: 262 mg/dL — ABNORMAL HIGH (ref 70–99)
Glucose-Capillary: 186 mg/dL — ABNORMAL HIGH (ref 70–99)
Glucose-Capillary: 273 mg/dL — ABNORMAL HIGH (ref 70–99)

## 2012-04-23 MED ORDER — PREDNISONE 20 MG PO TABS
40.0000 mg | ORAL_TABLET | Freq: Every day | ORAL | Status: DC
  Administered 2012-04-24: 40 mg via ORAL
  Filled 2012-04-23 (×2): qty 2

## 2012-04-23 MED ORDER — HYDROCODONE-ACETAMINOPHEN 5-325 MG PO TABS
1.0000 | ORAL_TABLET | Freq: Four times a day (QID) | ORAL | Status: DC | PRN
  Administered 2012-04-23 (×2): 2 via ORAL
  Filled 2012-04-23 (×2): qty 2

## 2012-04-23 MED ORDER — FUROSEMIDE 80 MG PO TABS
80.0000 mg | ORAL_TABLET | Freq: Two times a day (BID) | ORAL | Status: DC
  Administered 2012-04-23 – 2012-04-24 (×2): 80 mg via ORAL
  Filled 2012-04-23 (×4): qty 1

## 2012-04-23 MED ORDER — TECHNETIUM TC 99M SULFUR COLLOID
2.0000 | Freq: Once | INTRAVENOUS | Status: AC | PRN
  Administered 2012-04-23: 2 via INTRAVENOUS

## 2012-04-23 MED ORDER — WARFARIN SODIUM 2.5 MG PO TABS
12.5000 mg | ORAL_TABLET | Freq: Once | ORAL | Status: AC
  Administered 2012-04-23: 12.5 mg via ORAL
  Filled 2012-04-23: qty 1

## 2012-04-23 NOTE — Progress Notes (Signed)
There is some delay on gastric emptying study with 41% retention at 2 hours. This may signify a mild gastroparesis and may be a source of his nausea, although the nausea could be multifactorial. It does appear that he is now on metoclopramide. He does not recall taking this medication previously at home however.  He mentions that he has diarrhea also along with the nausea. He was hoping that in the past when he had his gallbladder removed there would get rid of the nausea and the diarrhea but it did not.  Abdomen is soft nontender  Impression: Chronic nausea, there could be a component of mild gastroparesis as a source of this problem.                       Diarrhea which appears to be chronic and of uncertain etiology.  Plan: Continue metoclopramide and see if this helps symptomatically.           Look into whether he has had any prior evaluation for diarrhea. I think he has been evaluated in the past by Dr. Laural Benes with [facility] and I will check into that with the office records.

## 2012-04-23 NOTE — Progress Notes (Signed)
Subjective: "I feel better today." Up in chair watching TV. NAD. No events during night. Reports eating "a good supper last night". Denies abd pain/nausea  Objective: Vital signs Filed Vitals:   04/22/12 0650 04/22/12 1452 04/22/12 2212 04/23/12 0637  BP: 126/74 135/76 148/72 136/76  Pulse: 84 74 74 72  Temp: 98.2 F (36.8 C) 98.5 F (36.9 C) 99.2 F (37.3 C) 98.2 F (36.8 C)  TempSrc: Oral Oral Oral Oral  Resp: 20 20 20 20   Height:      Weight: 159.1 kg (350 lb 12 oz)   158.3 kg (348 lb 15.8 oz)  SpO2: 98% 95% 97% 100%   Weight change: -0.8 kg (-1 lb 12.2 oz) Last BM Date: 04/18/12  Intake/Output from previous day: 04/28 0701 - 04/29 0700 In: 1458 [P.O.:960; I.V.:480; IV Piggyback:18] Out: 2975 [Urine:2975]     Physical Exam: General: Alert, awake, oriented x3, in no acute distress. Up in chair HEENT: No bruits, no goiter. Mucus membranes moist/pink Heart: Regular rate and rhythm, without murmurs, rubs, gallops. Lungs:Normal effort. Breath sounds clear to auscultation bilaterally. No wheeze, rhonchi Abdomen:  Morbidly obese, Soft, nontender, nondistended, positive bowel sounds. Extremities: No clubbing cyanosis with positive pedal pulses. 1+ LEE. Left elbow without erythema/edema. Improved ROM Neuro: Grossly intact, nonfocal.    Lab Results: Basic Metabolic Panel:  Basename 04/23/12 0440 04/22/12 0525  NA 135 131*  K 4.3 4.8  CL 97 95*  CO2 27 25  GLUCOSE 136* 210*  BUN 61* 59*  CREATININE 1.83* 2.24*  CALCIUM 8.1* 8.0*  MG -- --  PHOS -- --   Liver Function Tests:  Basename 04/22/12 0525 04/21/12 0700  AST 17 18  ALT 9 11  ALKPHOS 55 59  BILITOT 0.3 0.4  PROT 6.8 6.8  ALBUMIN 2.7* 2.7*   No results found for this basename: LIPASE:2,AMYLASE:2 in the last 72 hours No results found for this basename: AMMONIA:2 in the last 72 hours CBC:  Basename 04/22/12 0525 04/21/12 0700  WBC 11.2* 12.8*  NEUTROABS -- --  HGB 12.8* 13.5  HCT 38.7* 40.5  MCV  83.9 84.6  PLT 209 176   Cardiac Enzymes: No results found for this basename: CKTOTAL:3,CKMB:3,CKMBINDEX:3,TROPONINI:3 in the last 72 hours BNP: No results found for this basename: PROBNP:3 in the last 72 hours D-Dimer: No results found for this basename: DDIMER:2 in the last 72 hours CBG:  Basename 04/22/12 2154 04/22/12 1614 04/22/12 1147 04/22/12 0728 04/21/12 2209 04/21/12 1626  GLUCAP 132* 178* 222* 250* 207* 201*   Hemoglobin A1C: No results found for this basename: HGBA1C in the last 72 hours Fasting Lipid Panel: No results found for this basename: CHOL,HDL,LDLCALC,TRIG,CHOLHDL,LDLDIRECT in the last 72 hours Thyroid Function Tests: No results found for this basename: TSH,T4TOTAL,FREET4,T3FREE,THYROIDAB in the last 72 hours Anemia Panel: No results found for this basename: VITAMINB12,FOLATE,FERRITIN,TIBC,IRON,RETICCTPCT in the last 72 hours Coagulation:  Basename 04/23/12 0440 04/22/12 0525  LABPROT 19.2* 17.4*  INR 1.58* 1.40   Urine Drug Screen: Drugs of Abuse  No results found for this basename: labopia, cocainscrnur, labbenz, amphetmu, thcu, labbarb    Alcohol Level: No results found for this basename: ETH:2 in the last 72 hours Urinalysis: No results found for this basename: COLORURINE:2,APPERANCEUR:2,LABSPEC:2,PHURINE:2,GLUCOSEU:2,HGBUR:2,BILIRUBINUR:2,KETONESUR:2,PROTEINUR:2,UROBILINOGEN:2,NITRITE:2,LEUKOCYTESUR:2 in the last 72 hours Misc. Labs:  Recent Results (from the past 240 hour(s))  URINE CULTURE     Status: Normal (Preliminary result)   Collection Time   04/19/12 10:58 PM      Component Value Range Status Comment  Specimen Description URINE, CLEAN CATCH   Final    Special Requests NONE   Final    Culture  Setup Time [id number]   Final    Colony Count >=100,000 COLONIES/ML   Final    Culture ESCHERICHIA COLI   Final    Report Status PENDING   Incomplete   CULTURE, BLOOD (ROUTINE X 2)     Status: Normal (Preliminary result)   Collection Time    04/20/12  8:50 AM      Component Value Range Status Comment   Specimen Description BLOOD RIGHT ANTECUBITAL   Final    Special Requests BOTTLES DRAWN AEROBIC AND ANAEROBIC 5CC   Final    Culture  Setup Time [id number]   Final    Culture     Final    Value:        BLOOD CULTURE RECEIVED NO GROWTH TO DATE CULTURE WILL BE HELD FOR 5 DAYS BEFORE ISSUING A FINAL NEGATIVE REPORT   Report Status PENDING   Incomplete   CULTURE, BLOOD (ROUTINE X 2)     Status: Normal (Preliminary result)   Collection Time   04/20/12  9:00 AM      Component Value Range Status Comment   Specimen Description BLOOD RIGHT ARM   Final    Special Requests BOTTLES DRAWN AEROBIC AND ANAEROBIC 5CC   Final    Culture  Setup Time [id number]   Final    Culture     Final    Value:        BLOOD CULTURE RECEIVED NO GROWTH TO DATE CULTURE WILL BE HELD FOR 5 DAYS BEFORE ISSUING A FINAL NEGATIVE REPORT   Report Status PENDING   Incomplete     Studies/Results: No results found.  Medications: Scheduled Meds:   . aspirin EC  325 mg Oral Daily  . atorvastatin  20 mg Oral q1800  . calcitRIOL  0.25 mcg Oral Q breakfast  . carvedilol  12.5 mg Oral BID WC  . cefUROXime  250 mg Oral BID WC  . colchicine  0.6 mg Oral Daily  . enoxaparin (LOVENOX) injection  150 mg Subcutaneous Q24H  . furosemide  80 mg Intravenous BID  . gabapentin  300 mg Oral BID  . hydroxychloroquine  200 mg Oral Daily  . insulin aspart  25-50 Units Subcutaneous TID WC  . insulin NPH  55 Units Subcutaneous BID  . metoCLOPramide  5 mg Oral TID AC & HS  . pantoprazole  40 mg Oral Q1200  . predniSONE  40 mg Oral BID WC  . senna  1 tablet Oral BID  . sodium chloride  3 mL Intravenous Q12H  . warfarin  12.5 mg Oral ONCE-1800  . Warfarin - Pharmacist Dosing Inpatient   Does not apply q1800   Continuous Infusions:   . sodium chloride 20 mL/hr (04/23/12 0609)   PRN Meds:.acetaminophen, acetaminophen, albuterol-ipratropium, HYDROmorphone (DILAUDID) injection,  ondansetron (ZOFRAN) IV, ondansetron, promethazine  Assessment/Plan:  Principal Problem:  *Pulmonary embolism Active Problems:  Diabetes mellitus  Syncope and collapse  Dehydration  Chronic diarrhea  Nausea and vomiting  Elevated troponin  Abnormal ECG  Chronic renal insufficiency  DVT (deep venous thrombosis)  Syncope/SOB/minimally elevated Trop  VQ scan was high probability for PE. Venous dopplers show b/l DVT as well. This is the explanation for patient's presentation. No further episodes. Sats 95% on RA.   Acute PE and b/l DVT's  Likely due to surgery recently in January. Now on Lovenox and Warfarin. Sats  95% on RA.   Acute on chronic renal insufficiency  Creatinine continues to improve. On Lasix. Appreciate Nephrology input. Continue Strict I&O's, hold nephrotoxins. Will discontinue foley. Consider transitioning lasix to po.  UTI  E coli. Sensitivities pending. On Ceftin day #5  Acute Gout Flare of Left Elbow and shoulder  Continues to improve. Able to move elbow and shoulder without difficulty. Continue steroids. Continue Colchicine. Dilaudid for pain control. Unable to give NSAIDS due to renal insufficiency.  Leukocytosis/fever  Fever resolved. Most likely related to UTI, but could also be from Gout and DVT's. Blood cultures done. Monitor closely  Chronic nausea, vomiting, and diarrhea  Much improved  today. Symptoms wax and wane. Appreciate GI input.  GE study pending. He tolerated full dinner last evening without n/v. Etiology remains unclear. Differential include choledocholithiasis though unlikely considering normal LFT's. Continue PPI though he doesn't really mention heartburn.  DM2  CBG range 132-222. Patient also on steroids. Will monitor closely and adjust insulin as needed. Currently on 55 units NPH bid. Home dose 90 units bid. Continue SSI as well.   Chronic CHF (diastolic)  Normal systolic function. Evidence for fluid overload. Possibly from diastolic dysfunction.  On Lasix per nephrology. Volume status - for hospitalization. Continue IV lasix one more day then consider transition to po.   HTN  SBP range 126-148. On coreg. Home lotrel and lisinopril remain on hold secondary to #2.  Dispo: Home when medically stable, hopefully 24-48 hours.      LOS: 6 days   [facility] M 04/23/2012, 7:53 AM

## 2012-04-23 NOTE — Progress Notes (Signed)
Patient seen and examined. Agree with above note. Patient feels better. No nausea. Tolerating diet. Elbow is better. Walking with some assistance within room. No diarrhea. Overall much improved. PT/OT as he may need HH Change to oral lasix. Decrease steroids. Possibly home in AM  [facility] 11:48 AM

## 2012-04-23 NOTE — Progress Notes (Signed)
   CARE MANAGEMENT NOTE 04/23/2012  Patient:  GOERGE, MOHR   Account Number:  [id number]  Date Initiated:  04/18/2012  Documentation initiated by:  Lanier Clam  Subjective/Objective Assessment:   ADMITTED W/SYNCOPE     Action/Plan:   FROM HOME ALONE   Anticipated DC Date:  04/24/2012   Anticipated DC Plan:  HOME/SELF CARE         Choice offered to / List presented to:  C-1 Patient           Status of service:  In process, will continue to follow Medicare Important Message given?   (If response is "NO", the following Medicare IM given date fields will be blank) Date Medicare IM given:   Date Additional Medicare IM given:    Discharge Disposition:    Per UR Regulation:  Reviewed for med. necessity/level of care/duration of stay  If discussed at Long Length of Stay Meetings, dates discussed:    Comments:  04/23/12 Mileigh Tilley RN,BSN NCM [phone] PROVIDED W/HHC AGENCY LIST AS RESOURCE.NOTED PT/OT EVAL.IF D/C HOME W/LOVENOX-HAS COVERAGE W/MINIMAL CO-PAY.  04/18/12 Mohanad Carsten RN,BSN NCM [phone]

## 2012-04-23 NOTE — Progress Notes (Signed)
ANTICOAGULATION CONSULT NOTE - Follow Up Consult  Pharmacy Consult for Lovenox and Warfarin Indication: pulmonary embolus/bilateral DVT  Allergies  Allergen Reactions  . Ciprofloxacin Hives  . Levofloxacin (Levaquin) Hives    Patient Measurements: Height: 6' 0.83" (185 cm) Weight: 348 lb 15.8 oz (158.3 kg) IBW/kg (Calculated) : 79.52   Vital Signs: Temp: 98.2 F (36.8 C) (04/29 0637) Temp src: Oral (04/29 0637) BP: 136/76 mmHg (04/29 0637) Pulse Rate: 72  (04/29 0637)  Labs:  Basename 04/23/12 0440 04/22/12 0525 04/21/12 0700  HGB -- 12.8* 13.5  HCT -- 38.7* 40.5  PLT -- 209 176  APTT -- -- --  LABPROT 19.2* 17.4* 18.0*  INR 1.58* 1.40 1.46  HEPARINUNFRC -- -- --  CREATININE 1.83* 2.24* 2.84*  CKTOTAL -- -- --  CKMB -- -- --  TROPONINI -- -- --   Estimated Creatinine Clearance: 69.1 ml/min (by C-G formula based on Cr of 1.83).   Medications:  Scheduled:     . aspirin EC  325 mg Oral Daily  . atorvastatin  20 mg Oral q1800  . calcitRIOL  0.25 mcg Oral Q breakfast  . carvedilol  12.5 mg Oral BID WC  . cefUROXime  250 mg Oral BID WC  . colchicine  0.6 mg Oral Daily  . enoxaparin (LOVENOX) injection  150 mg Subcutaneous Q24H  . furosemide  80 mg Intravenous BID  . gabapentin  300 mg Oral BID  . hydroxychloroquine  200 mg Oral Daily  . insulin aspart  25-50 Units Subcutaneous TID WC  . insulin NPH  55 Units Subcutaneous BID  . metoCLOPramide  5 mg Oral TID AC & HS  . pantoprazole  40 mg Oral Q1200  . predniSONE  40 mg Oral BID WC  . senna  1 tablet Oral BID  . sodium chloride  3 mL Intravenous Q12H  . warfarin  12.5 mg Oral ONCE-1800  . Warfarin - Pharmacist Dosing Inpatient   Does not apply q1800    Assessment:  61 yo obese M with PE and bilateral DVT's on Day 5/5 lovenox/coumadin overalp.   INR subtherapeutic but now rising after doses so far as inpatient of 10mg , 10, 10, 7.5 and 12.5  Chronic renal insufficiency with current Scr continuing to  improve - N CrCl now 45 ml/min/1.41m2  LMWH previously elevated with q12h lovenox dosing secondary to acute on chronic renal failure and now on q24h hour dosing - Level was therapeutic last night and dose was not needed to be adjusted.   Goal of Therapy:  INR 2-3 Anti-Xa level (LMWH) 0.6-1.2 units/ml   Plan: 1.) Repeat 12.5mg  warfarin tonight 2.) Continue lovenox 150 mg Q24 hr for now. If SCr continues to improve and INR does not reach goal levels in the next couple of days will be inclined to recheck a LMWH level again because they very likely will start to clear the drug quicker with improving renal function. 3.) Now plan per guidelines is to continue lovenox/coumadin overlap until INR therapeutic x 24 hours.  4.) Follow daily PT/INR   Hessie Knows, PharmD, BCPS Pager [phone] 04/23/2012 9:14 AM

## 2012-04-24 LAB — BASIC METABOLIC PANEL
BUN: 60 mg/dL — ABNORMAL HIGH (ref 6–23)
CO2: 26 mEq/L (ref 19–32)
Calcium: 8 mg/dL — ABNORMAL LOW (ref 8.4–10.5)
Chloride: 98 mEq/L (ref 96–112)
Creatinine, Ser: 1.64 mg/dL — ABNORMAL HIGH (ref 0.50–1.35)
GFR calc Af Amer: 52 mL/min — ABNORMAL LOW (ref >90)
GFR calc non Af Amer: 45 mL/min — ABNORMAL LOW (ref >90)
Glucose, Bld: 280 mg/dL — ABNORMAL HIGH (ref 70–99)
Potassium: 4.4 mEq/L (ref 3.5–5.1)
Sodium: 133 mEq/L — ABNORMAL LOW (ref 135–145)

## 2012-04-24 LAB — GLUCOSE, CAPILLARY
Glucose-Capillary: 288 mg/dL — ABNORMAL HIGH (ref 70–99)
Glucose-Capillary: 355 mg/dL — ABNORMAL HIGH (ref 70–99)

## 2012-04-24 LAB — PROTIME-INR
INR: 2.34 — ABNORMAL HIGH (ref 0.00–1.49)
Prothrombin Time: 26 seconds — ABNORMAL HIGH (ref 11.6–15.2)

## 2012-04-24 MED ORDER — WARFARIN SODIUM 7.5 MG PO TABS: 7.5000 mg | ORAL_TABLET | Freq: Every day | ORAL | Status: DC

## 2012-04-24 MED ORDER — ENOXAPARIN SODIUM 150 MG/ML ~~LOC~~ SOLN: 150.0000 mg | SUBCUTANEOUS | Status: DC

## 2012-04-24 MED ORDER — COLCHICINE 0.6 MG PO TABS: 0.6000 mg | ORAL_TABLET | Freq: Every day | ORAL | Status: DC

## 2012-04-24 MED ORDER — CEFUROXIME AXETIL 250 MG PO TABS: 250.0000 mg | ORAL_TABLET | Freq: Two times a day (BID) | ORAL | Status: AC

## 2012-04-24 MED ORDER — ATORVASTATIN CALCIUM 20 MG PO TABS: 20.0000 mg | ORAL_TABLET | Freq: Every day | ORAL | Status: DC

## 2012-04-24 MED ORDER — CARVEDILOL 12.5 MG PO TABS: 12.5000 mg | ORAL_TABLET | Freq: Two times a day (BID) | ORAL | Status: DC

## 2012-04-24 MED ORDER — PANTOPRAZOLE SODIUM 40 MG PO TBEC: 40.0000 mg | DELAYED_RELEASE_TABLET | Freq: Every day | ORAL | Status: DC

## 2012-04-24 MED ORDER — INSULIN NPH (HUMAN) (ISOPHANE) 100 UNIT/ML ~~LOC~~ SUSP: 55.0000 [IU] | Freq: Two times a day (BID) | SUBCUTANEOUS | Status: DC

## 2012-04-24 MED ORDER — PREDNISONE 10 MG PO TABS: ORAL_TABLET | ORAL | Status: DC

## 2012-04-24 MED ORDER — METOCLOPRAMIDE HCL 5 MG PO TABS: 5.0000 mg | ORAL_TABLET | Freq: Three times a day (TID) | ORAL | Status: DC

## 2012-04-24 MED ORDER — ENOXAPARIN (LOVENOX) PATIENT EDUCATION KIT
PACK | Freq: Once | Status: AC
  Administered 2012-04-24: 09:00:00
  Filled 2012-04-24: qty 1

## 2012-04-24 MED ORDER — AMLODIPINE BESYLATE 5 MG PO TABS: 5.0000 mg | ORAL_TABLET | Freq: Every day | ORAL | Status: DC

## 2012-04-24 NOTE — Discharge Summary (Signed)
Physician Discharge Summary  Patient ID: Mitchell Parker. MRN: [id number] DOB/AGE: Sep 16, 1951 61 y.o.  Admit date: 04/17/2012 Discharge date: 04/24/2012  Primary Care Physician:  Toma Deiters, MD, MD   Discharge Diagnoses:    Principal Problem:  *Pulmonary embolism Active Problems:  Diabetes mellitus  Syncope and collapse  Dehydration  Chronic diarrhea  Nausea and vomiting  Elevated troponin  Abnormal ECG  Chronic renal insufficiency  DVT (deep venous thrombosis)   PLEASE NOTE THAT PATIENT WAS INADVERTENTLY DISCHARGED ON AMLODIPINE/BENAZEPRIL. WE CALLED THE PATIENT AND RECTIFIED THIS ERROR. HE WAS ALSO CALLED IN A PRESCRIPTION FOR AMLODIPINE.  Medication List  As of 04/24/2012 11:38 AM   STOP taking these medications         lisinopril 10 MG tablet      omeprazole 20 MG capsule         TAKE these medications         albuterol-ipratropium 18-103 MCG/ACT inhaler   Commonly known as: COMBIVENT   Inhale 2 puffs into the lungs every 6 (six) hours as needed. Wheezing      amlodipine-benazepril 2.5-10 MG per capsule   Commonly known as: LOTREL   Take 1 capsule by mouth daily.      aspirin 81 MG tablet   Take 81 mg by mouth 2 (two) times daily.      atorvastatin 20 MG tablet   Commonly known as: LIPITOR   Take 1 tablet (20 mg total) by mouth daily at 6 PM.      calcitRIOL 0.25 MCG capsule   Commonly known as: ROCALTROL   Take 0.25 mcg by mouth daily with breakfast.      carvedilol 12.5 MG tablet   Commonly known as: COREG   Take 1 tablet (12.5 mg total) by mouth 2 (two) times daily with a meal.      cefUROXime 250 MG tablet   Commonly known as: CEFTIN   Take 1 tablet (250 mg total) by mouth 2 (two) times daily with a meal. Take 1 tablet (250 mg total) by mouth 2 (two) times daily with a meal. Give with food. Last dose 04/28/12      colchicine 0.6 MG tablet   Take 1 tablet (0.6 mg total) by mouth daily.      enoxaparin 150 MG/ML injection   Commonly known  as: LOVENOX   Inject 1 mL (150 mg total) into the skin daily.      furosemide 80 MG tablet   Commonly known as: LASIX   Take 80 mg by mouth 2 (two) times daily.      gabapentin 300 MG capsule   Commonly known as: NEURONTIN   Take 300 mg by mouth 2 (two) times daily.      HYDROcodone-acetaminophen 7.5-325 MG per tablet   Commonly known as: NORCO   Take 1 tablet by mouth every 8 (eight) hours as needed for pain. Pain      HYDROXYCHLOROQUINE SULFATE PO   Take 300 mg by mouth 2 (two) times daily.      insulin aspart 100 UNIT/ML injection   Commonly known as: novoLOG   Inject 50 Units into the skin 3 (three) times daily before meals.      insulin NPH 100 UNIT/ML injection   Commonly known as: HUMULIN N,NOVOLIN N   Inject 55 Units into the skin 2 (two) times daily.      metoCLOPramide 5 MG tablet   Commonly known as: REGLAN   Take 1 tablet (5  mg total) by mouth 4 (four) times daily -  before meals and at bedtime.      pantoprazole 40 MG tablet   Commonly known as: PROTONIX   Take 1 tablet (40 mg total) by mouth daily at 12 noon.      pravastatin 80 MG tablet   Commonly known as: PRAVACHOL   Take 80 mg by mouth at bedtime.      predniSONE 10 MG tablet   Commonly known as: DELTASONE   Take 4 tabs 5/1, take 3 tabs 5/2 and 5/3, take 2 tabs 5/4 and 5/5 take 1 tab 5/6 and 5/7 then stop.      traMADol 50 MG tablet   Commonly known as: ULTRAM   Take 50 mg by mouth every 8 (eight) hours as needed. Maximum dose= 8 tablets per day. Pain        warfarin 7.5 MG tablet   Commonly known as: COUMADIN   Take 1 tablet (7.5 mg total) by mouth daily.             Disposition and Follow-up: pt medically stable and ready for discharge to home. Will follow up with PCP 1 week. Will follow up with GI as well 1-2 weeks  Consults:  GI and nephrology  Physical Exam:  General: Alert, awake, oriented x3, in no acute distress. Up in chair  HEENT: No bruits, no goiter. Mucus membranes  moist/pink  Heart: Regular rate and rhythm, without murmurs, rubs, gallops.  Lungs:Normal effort. Breath sounds clear to auscultation bilaterally. No wheeze, rhonchi  Abdomen: Morbidly obese, Soft, nontender, nondistended, positive bowel sounds.  Extremities: No clubbing cyanosis with positive pedal pulses. 1+ LEE. Left elbow without erythema/edema. Improved ROM  Neuro: Grossly intact, nonfocal.    Significant Diagnostic Studies:    Labs Reviewed  GLUCOSE, CAPILLARY - Abnormal; Notable for the following:    Glucose-Capillary 324 (*)    All other components within normal limits  GLUCOSE, CAPILLARY - Abnormal; Notable for the following:    Glucose-Capillary 228 (*)    All other components within normal limits  COMPREHENSIVE METABOLIC PANEL - Abnormal; Notable for the following:    Glucose, Bld 246 (*)    Creatinine, Ser 1.71 (*)    GFR calc non Af Amer 42 (*)    GFR calc Af Amer 49 (*)    All other components within normal limits  CARDIAC PANEL(CRET KIN+CKTOT+MB+TROPI) - Abnormal; Notable for the following:    Troponin I 0.33 (*)    All other components within normal limits  PRO B NATRIURETIC PEPTIDE - Abnormal; Notable for the following:    Pro B Natriuretic peptide (BNP) 11286.0 (*)    All other components within normal limits  CARDIAC PANEL(CRET KIN+CKTOT+MB+TROPI) - Abnormal; Notable for the following:    Troponin I 0.37 (*)    All other components within normal limits  BASIC METABOLIC PANEL - Abnormal; Notable for the following:    Glucose, Bld 192 (*)    Creatinine, Ser 1.76 (*)    GFR calc non Af Amer 41 (*)    GFR calc Af Amer 47 (*)    All other components within normal limits  HEPARIN LEVEL (UNFRACTIONATED) - Abnormal; Notable for the following:    Heparin Unfractionated <0.10 (*)    All other components within normal limits  GLUCOSE, CAPILLARY - Abnormal; Notable for the following:    Glucose-Capillary 179 (*)    All other components within normal limits    GLUCOSE, CAPILLARY - Abnormal;  Notable for the following:    Glucose-Capillary 158 (*)    All other components within normal limits  HEMOGLOBIN A1C - Abnormal; Notable for the following:    Hemoglobin A1C 7.9 (*)    Mean Plasma Glucose 180 (*)    All other components within normal limits  GLUCOSE, CAPILLARY - Abnormal; Notable for the following:    Glucose-Capillary 144 (*)    All other components within normal limits  CBC - Abnormal; Notable for the following:    Hemoglobin 12.7 (*)    RDW 15.6 (*)    All other components within normal limits  BASIC METABOLIC PANEL - Abnormal; Notable for the following:    BUN 25 (*)    Creatinine, Ser 2.23 (*)    Calcium 7.9 (*)    GFR calc non Af Amer 31 (*)    GFR calc Af Amer 36 (*)    All other components within normal limits  GLUCOSE, CAPILLARY - Abnormal; Notable for the following:    Glucose-Capillary 66 (*)    All other components within normal limits  GLUCOSE, CAPILLARY - Abnormal; Notable for the following:    Glucose-Capillary 65 (*)    All other components within normal limits  URIC ACID - Abnormal; Notable for the following:    Uric Acid, Serum 12.6 (*)    All other components within normal limits  URINALYSIS, ROUTINE W REFLEX MICROSCOPIC - Abnormal; Notable for the following:    Color, Urine AMBER (*) BIOCHEMICALS MAY BE AFFECTED BY COLOR   APPearance CLOUDY (*)    Hgb urine dipstick SMALL (*)    Bilirubin Urine SMALL (*)    Ketones, ur TRACE (*)    Leukocytes, UA LARGE (*)    All other components within normal limits  CBC - Abnormal; Notable for the following:    WBC 14.7 (*)    RDW 15.7 (*)    All other components within normal limits  PROTIME-INR - Abnormal; Notable for the following:    Prothrombin Time 15.8 (*)    All other components within normal limits  BASIC METABOLIC PANEL - Abnormal; Notable for the following:    BUN 31 (*)    Creatinine, Ser 3.05 (*)    Calcium 8.1 (*)    GFR calc non Af Amer 21 (*)     GFR calc Af Amer 24 (*)    All other components within normal limits  GLUCOSE, CAPILLARY - Abnormal; Notable for the following:    Glucose-Capillary 154 (*)    All other components within normal limits  URINE MICROSCOPIC-ADD ON - Abnormal; Notable for the following:    Bacteria, UA MANY (*)    All other components within normal limits  GLUCOSE, CAPILLARY - Abnormal; Notable for the following:    Glucose-Capillary 110 (*)    All other components within normal limits  GLUCOSE, CAPILLARY - Abnormal; Notable for the following:    Glucose-Capillary 120 (*)    All other components within normal limits  CBC - Abnormal; Notable for the following:    WBC 12.8 (*)    All other components within normal limits  PROTIME-INR - Abnormal; Notable for the following:    Prothrombin Time 18.0 (*)    All other components within normal limits  BASIC METABOLIC PANEL - Abnormal; Notable for the following:    Sodium 134 (*)    Chloride 93 (*)    Glucose, Bld 180 (*)    BUN 49 (*)    Creatinine, Ser  2.84 (*)    Calcium 7.9 (*)    GFR calc non Af Amer 23 (*)    GFR calc Af Amer 27 (*)    All other components within normal limits  GLUCOSE, CAPILLARY - Abnormal; Notable for the following:    Glucose-Capillary 127 (*)    All other components within normal limits  GLUCOSE, CAPILLARY - Abnormal; Notable for the following:    Glucose-Capillary 197 (*)    All other components within normal limits  HEPATIC FUNCTION PANEL - Abnormal; Notable for the following:    Albumin 2.7 (*)    Indirect Bilirubin 0.2 (*)    All other components within normal limits  GLUCOSE, CAPILLARY - Abnormal; Notable for the following:    Glucose-Capillary 202 (*)    All other components within normal limits  GLUCOSE, CAPILLARY - Abnormal; Notable for the following:    Glucose-Capillary 201 (*)    All other components within normal limits  PROTIME-INR - Abnormal; Notable for the following:    Prothrombin Time 17.4 (*)    All  other components within normal limits  COMPREHENSIVE METABOLIC PANEL - Abnormal; Notable for the following:    Sodium 131 (*)    Chloride 95 (*)    Glucose, Bld 210 (*)    BUN 59 (*)    Creatinine, Ser 2.24 (*)    Calcium 8.0 (*)    Albumin 2.7 (*)    GFR calc non Af Amer 31 (*)    GFR calc Af Amer 35 (*)    All other components within normal limits  CBC - Abnormal; Notable for the following:    WBC 11.2 (*)    Hemoglobin 12.8 (*)    HCT 38.7 (*)    All other components within normal limits  GLUCOSE, CAPILLARY - Abnormal; Notable for the following:    Glucose-Capillary 207 (*)    All other components within normal limits  GLUCOSE, CAPILLARY - Abnormal; Notable for the following:    Glucose-Capillary 250 (*)    All other components within normal limits  GLUCOSE, CAPILLARY - Abnormal; Notable for the following:    Glucose-Capillary 222 (*)    All other components within normal limits  GLUCOSE, CAPILLARY - Abnormal; Notable for the following:    Glucose-Capillary 178 (*)    All other components within normal limits  PROTIME-INR - Abnormal; Notable for the following:    Prothrombin Time 19.2 (*)    INR 1.58 (*)    All other components within normal limits  BASIC METABOLIC PANEL - Abnormal; Notable for the following:    Glucose, Bld 136 (*)    BUN 61 (*)    Creatinine, Ser 1.83 (*)    Calcium 8.1 (*)    GFR calc non Af Amer 39 (*)    GFR calc Af Amer 45 (*)    All other components within normal limits  GLUCOSE, CAPILLARY - Abnormal; Notable for the following:    Glucose-Capillary 132 (*)    All other components within normal limits  GLUCOSE, CAPILLARY - Abnormal; Notable for the following:    Glucose-Capillary 185 (*)    All other components within normal limits  PROTIME-INR - Abnormal; Notable for the following:    Prothrombin Time 26.0 (*)    INR 2.34 (*)    All other components within normal limits  BASIC METABOLIC PANEL - Abnormal; Notable for the following:     Sodium 133 (*)    Glucose, Bld 280 (*)  BUN 60 (*)    Creatinine, Ser 1.64 (*)    Calcium 8.0 (*)    GFR calc non Af Amer 45 (*)    GFR calc Af Amer 52 (*)    All other components within normal limits  GLUCOSE, CAPILLARY - Abnormal; Notable for the following:    Glucose-Capillary 262 (*)    All other components within normal limits  GLUCOSE, CAPILLARY - Abnormal; Notable for the following:    Glucose-Capillary 186 (*)    All other components within normal limits  GLUCOSE, CAPILLARY - Abnormal; Notable for the following:    Glucose-Capillary 273 (*)    All other components within normal limits  GLUCOSE, CAPILLARY - Abnormal; Notable for the following:    Glucose-Capillary 288 (*)    All other components within normal limits  GLUCOSE, CAPILLARY - Abnormal; Notable for the following:    Glucose-Capillary 355 (*)    All other components within normal limits  CBC  DIFFERENTIAL  LIPASE, BLOOD  APTT  PROTIME-INR  CARDIAC PANEL(CRET KIN+CKTOT+MB+TROPI)  CBC  CARDIAC PANEL(CRET KIN+CKTOT+MB+TROPI)  PROTIME-INR  LOW MOLECULAR WGT HEPARIN (FRACTIONATED)  GLUCOSE, CAPILLARY  GLUCOSE, CAPILLARY  GLUCOSE, CAPILLARY  GLUCOSE, CAPILLARY  GLUCOSE, CAPILLARY  URINE CULTURE  GLUCOSE, CAPILLARY  CULTURE, BLOOD (ROUTINE X 2)  CULTURE, BLOOD (ROUTINE X 2)  URIC ACID, RANDOM URINE  SODIUM, URINE, RANDOM  CREATININE, URINE, RANDOM  LOW MOLECULAR WGT HEPARIN (FRACTIONATED)        Nm Gastric Emptying  04/23/2012  *RADIOLOGY REPORT*  Clinical Data:  Chronic nausea and vomiting.  Diabetes.  NUCLEAR MEDICINE GASTRIC EMPTYING SCAN  Technique:  After oral ingestion of radiolabeled meal, sequential abdominal images were obtained for 120 minutes.  Residual percentage of activity remaining within the stomach was calculated at 60 and 120 minutes.  Radiopharmaceutical:  1.9 mCi Tc-60m sulfur colloid.  Comparison:  None.  Findings: At 60 minutes 81% of the ingested activity remained in the stomach.   At 120 minutes 41% of activity ingested by the patient remained within the stomach.  This is abnormal.  Normal retention of ingested activity with this technique is less than 30% remaining within the stomach at 120 minutes.  IMPRESSION: At 120 minutes 41% of ingested activity remained within the stomach.This is abnormal.  Normal retention of ingested activity with this technique is less than 30% remaining within the stomach at 120 minutes.  Original Report Authenticated By: Crawford Givens, M.D.   Nm Pulmonary Per & Vent  04/18/2012  *RADIOLOGY REPORT*  Clinical Data:  61 year old male with shortness of breath.  NUCLEAR MEDICINE VENTILATION - PERFUSION LUNG SCAN  Technique:  Wash-in, equilibrium, and wash-out phase ventilation images were obtained using Xe-133 gas.  Perfusion images were obtained in multiple projections after intravenous injection of Tc- 84m MAA.  Radiopharmaceuticals:  3.3 mCi Xe-133 gas and 10.0 mCi Tc-56m MAA.  Comparison:  Portable chest radiograph 04/17/2012 at 2047 hours.  Findings: Comparison chest radiograph demonstrates cardiomegaly and mild vascular congestion.  Ventilation imaging demonstrates no ventilation defect.  Perfusion imaging demonstrates significant lack of perfusion at the right lung base.  Additional small moderate sized wedge shaped peripheral perfusion defects, more numerous in the right upper lobe but one or two small lesions also present in the left upper lobe.  IMPRESSION: High probability of acute pulmonary embolus.  Critical Value/emergent results were called by telephone at the time of interpretation on 04/18/2012  at 1615 hours  to  Dr. Osvaldo Shipper, who verbally acknowledged these results.  Original Report Authenticated By: Harley Hallmark, M.D.   Dg Chest Portable 1 View  04/17/2012  *RADIOLOGY REPORT*  Clinical Data: Dizziness.  Near-syncope.  Short of breath. Vomiting.  PORTABLE CHEST - 1 VIEW  Comparison: 01/18/2012  Findings: Cardiomegaly stable.  Both lungs  are clear.  No evidence of pleural effusion.  No mass or lymphadenopathy identified.  IMPRESSION: Stable cardiomegaly.  No active lung disease.  Original Report Authenticated By: Danae Orleans, M.D.       Brief H and P: 89EAV with numerous medical problems including non-ischemic CHF but now with recovered EF 60-65%, COPD, OSA/CPAP, morbid obesity, DM/insulin presents with 57yr h/o diarrhea, nausea, vomiting, dehydration, and sycnopal episode and found to have minimally elevated Trop, acutely elevated BNP, and abnormal ECG. Pt stated he's had 1 year of diarrhea, described as foul-smelling, yellow, liquidy but also with some solid, up to 3-4 times per day. It will come on for 3-4 days then go away for 3-4 days. Also with chronic nausea and vomiting 1-2 times per day. He was last admitted to [facility] 12/2011 with a one year h/o cholelithiasis, had scheduled an elective CCY but pain worsened so he came in acutely and was admitted, had lap chole, and was advised to f/u with GI, decision made to defer MRCP and only if recurrent pain. He states he's had persistent symptoms since this surgery. Day before admission he was mowing the lawn, finished and started having nausea, diarrhea. Went to pick his wife downtown [place], and when got back to home, when getting out the truck, he had sudden onset dizziness described more like presyncope, and acute SOB. He tried to walk it off, but when he got the top of the stairs, he dropped his keys, so bent over to pick them up. Next thing, he was waking up on the porch with his wife putting a cool rag on his head. He knew where he was immediately. No associated chest pain. He felt very dehydrated.    Hospital Course:   Principal Problem:  *Pulmonary embolism Active Problems:  Diabetes mellitus  Syncope and collapse  Dehydration  Chronic diarrhea  Nausea and vomiting  Elevated troponin  Abnormal ECG  Chronic renal insufficiency  DVT (deep venous  thrombosis)   Syncope/SOB/minimally elevated Trop   Pt admitted to tele. Started on Heparin drip. CE as above, chest xray as above. Echo on 4/25 yields 60% EF with mild LVH.  VQ scan was high probability for PE. Venous dopplers show b/l DVT as well. Heparin transitioned to Lovenox and warfarin. This is the explanation for patient's presentation.  Pt had no further episodes. Sats 95% on RA. On day of discharge INR 2.34. Will continue warfarin and 1 more day lovenox. HH to see 04/25/12 for INR draw/warfarin dosing and confirm that lovenox can be discontinued.   Acute PE and b/l DVT's  Likely due to surgery recently in January. See #1. Will follow up with PCP 1 week. Continue warfarin. HH INR checks.  Sats 95% on RA.   Acute on chronic renal insufficiency   On admissionCreatinine 1.7. It worsened over 2 days and got to 3/05 4/26 at which time pt seen by renal. He was diuresed with IV lasix, ACE held. He gradually improved and at discharge creatinine 1.6 which is baseline. At discharge will resume home lasix dose.    UTI  E coli. On Ceftin. Will be discharge with 5 more days to complete 10 day course.    Acute Gout  Flare of Left Elbow and shoulder   Started colchecine and steroids. No NSAIDS due to #3. At discharge continues to improve. Able to move elbow and shoulder without difficulty.  Will discharge in quick taper of steroids. Continue Colchicine.    Leukocytosis/fever  Fever resolved. Most likely related to UTI, but could also be from Gout and DVT's. Blood cultures neg.   Chronic nausea, vomiting, and diarrhea  Much improved at discharge. Symptoms wax and wane. Appreciate GI input. GE study as above. . Etiology remains unclear. Differential include choledocholithiasis though unlikely considering normal LFT's. Continue PPI though he doesn't really mention heartburn. Will continue reglan. At discharge tolerating diet. Will follow up with Dr. Barrett [facility]  DM2  Initially hypoglycemic. Home  insulin dose decreased to 55 units vs 90 units. Also used SSi. Fair control. CBG range 186-300. On steroids as well. Will discharge on 55 units.Expect better control as steroids tape. Will follow up with PCP 1 week eval for control and adjust dose as needed. Will continue with CBG checks    Chronic CHF (diastolic)  Normal systolic function. Evidence for fluid overload. Possibly from diastolic dysfunction. Echo with EF 60% mild LVH. Diuresed with IV lasix initially. Then transitioned to home dose po. Volume status at discharge neg 2.4L.    HTN   Initially BP somewhat soft. Given IV fluids. Antihypertensives held. Coreg resumed at lower dose. At discharge pt continues amlodipine and coreg. Llisinopril remain on hold secondary to #2.    Time spent on Discharge: 45 min   Signed: Gwenyth Bender 04/24/2012, 11:38 AM   Patient was seen and examined. Agree with above note with changes. Patient stable for discharge. All his questions were answered. He was appreciative of care.  Branson Kranz 3:08 PM

## 2012-04-24 NOTE — Progress Notes (Signed)
ANTICOAGULATION CONSULT NOTE - Follow Up Consult  Pharmacy Consult for Lovenox and Warfarin Indication: pulmonary embolus/bilateral DVT  Allergies  Allergen Reactions  . Ciprofloxacin Hives  . Levofloxacin (Levofloxacin) Hives    Patient Measurements: Height: 6' 0.83" (185 cm) Weight: 350 lb 15.6 oz (159.2 kg) (Standup scale) IBW/kg (Calculated) : 79.52   Vital Signs: Temp: 97.5 F (36.4 C) (04/30 0505) Temp src: Oral (04/30 0505) BP: 165/83 mmHg (04/30 0505) Pulse Rate: 73  (04/30 0505)  Labs:  Basename 04/24/12 0440 04/23/12 0440 04/22/12 0525  HGB -- -- 12.8*  HCT -- -- 38.7*  PLT -- -- 209  APTT -- -- --  LABPROT 26.0* 19.2* 17.4*  INR 2.34* 1.58* 1.40  HEPARINUNFRC -- -- --  CREATININE 1.64* 1.83* 2.24*  CKTOTAL -- -- --  CKMB -- -- --  TROPONINI -- -- --   Estimated Creatinine Clearance: 77.4 ml/min (by C-G formula based on Cr of 1.64).   Medications:  Scheduled:     . aspirin EC  325 mg Oral Daily  . atorvastatin  20 mg Oral q1800  . calcitRIOL  0.25 mcg Oral Q breakfast  . carvedilol  12.5 mg Oral BID WC  . cefUROXime  250 mg Oral BID WC  . colchicine  0.6 mg Oral Daily  . enoxaparin (LOVENOX) injection  150 mg Subcutaneous Q24H  . furosemide  80 mg Oral BID  . gabapentin  300 mg Oral BID  . hydroxychloroquine  200 mg Oral Daily  . insulin aspart  25-50 Units Subcutaneous TID WC  . insulin NPH  55 Units Subcutaneous BID  . metoCLOPramide  5 mg Oral TID AC & HS  . pantoprazole  40 mg Oral Q1200  . predniSONE  40 mg Oral Q breakfast  . senna  1 tablet Oral BID  . sodium chloride  3 mL Intravenous Q12H  . warfarin  12.5 mg Oral ONCE-1800  . Warfarin - Pharmacist Dosing Inpatient   Does not apply q1800  . DISCONTD: furosemide  80 mg Intravenous BID  . DISCONTD: predniSONE  40 mg Oral BID WC    Assessment:  61 yo obese M with PE and bilateral DVT's on Day 7 lovenox/coumadin overalp.   INR therapeutic after doses so far as inpatient of 10mg ,  10, 10, 7.5, 12.5 and 12.5  Chronic renal insufficiency with current Scr continuing to improve - N CrCl now 49 ml/min/1.60m2  LMWH previously elevated with q12h lovenox dosing secondary to acute on chronic renal failure and now on q24h hour dosing - Level was therapeutic 4/28 and dose was not needed to be adjusted.   Goal of Therapy:  INR 2-3 Anti-Xa level (LMWH) 0.6-1.2 units/ml   Plan: 1.) Plan to discharge home - Recommend warfarin 7.5mg  daily to begin tonight 2.) Continue lovenox 150 mg Q24 hr for today and tomorrow and D/C Lovenox after tomorrow's dose if INR > 2 again as expected 3.) Can give today's dose of Lovenox prior to discharge from hospital 3.) Recommend recheck of INR tomorrow to confirm Lovenox able to be discontinued after tomorrow's dose   Hessie Knows, PharmD, BCPS Pager [phone] 04/24/2012 9:00 AM

## 2012-04-24 NOTE — Progress Notes (Signed)
04/23/12 1121  PT Visit Information  Last PT Received On 04/23/12  Reason Eval/Treat Not Completed Patient refused ("I just got back from a test & I'm tired.  I'm aggraveted.")   Completed by Kingsley Callander , PT (late entry)

## 2012-04-24 NOTE — Progress Notes (Signed)
Physical Therapy Note  Spoke briefly with pt who reports mobilizing earlier today with OT without difficulty. Pt denies any further need for PT services (acute or home health). Wife present and confirms this as well. Pt discharging home today. Thanks. Rebeca Alert, PT  [phone]

## 2012-04-24 NOTE — Evaluation (Signed)
Occupational Therapy Evaluation Patient Details Name: Mitchell Parker. MRN: [id number] DOB: 1951/03/19 Today's Date: 04/24/2012 Time: 9147-8295 OT Time Calculation (min): 25 min  OT Assessment / Plan / Recommendation Clinical Impression  Pt presents to OT at close to baseline. Wife will assist as needed with ADL activity (she did this before this hospitalization)    OT Assessment  Patient does not need any further OT services    Follow Up Recommendations  No OT follow up    Equipment Recommendations  None recommended by OT;Other (comment) (pt has DME and AE)          Pertinent Vitals/Pain Discomfort in hands due to gout    ADL  Grooming: Simulated Where Assessed - Grooming: Standing at sink Upper Body Bathing: Simulated;Set up Where Assessed - Upper Body Bathing: Sitting, chair Lower Body Bathing: Simulated;Minimal assistance;Other (comment) (wife assists (this is baseline) pt has AE) Where Assessed - Lower Body Bathing: Sit to stand from chair Upper Body Dressing: Set up;Simulated Where Assessed - Upper Body Dressing: Sitting, chair;Supported Lower Body Dressing: Minimal assistance;Simulated;Other (comment) (wife will asssit) Where Assessed - Lower Body Dressing: Sit to stand from chair Toilet Transfer: Supervision/safety Toilet Transfer Method: Proofreader: Comfort height toilet Toileting - Clothing Manipulation: Performed;Supervision/safety Where Assessed - Glass blower/designer Manipulation: Standing Toileting - Hygiene: Simulated;Supervision/safety Where Assessed - Toileting Hygiene: Standing Tub/Shower Transfer: Simulated;Supervision/safety Tub/Shower Transfer Method: Ambulating Ambulation Related to ADLs: Sats 94 percent on room air. After walk, pts sats 88, but recovered to 92 in 1 minute with deep breathing ADL Comments: Wife will assist as needed.     Subjective Data  Subjective: I have dealt with COPD for a long time. 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[employer] Communication Communication: No difficulties    Cognition  Overall Cognitive Status: Appears within functional limits for tasks assessed/performed Arousal/Alertness: Awake/alert Orientation Level: Appears intact for tasks assessed Behavior During Session: [facility] for tasks performed       Mobility Transfers Transfers: Sit to Stand;Stand to Sit Sit to Stand: 5: Supervision;With upper extremity assist Stand to Sit: 5: Supervision;With upper extremity assist         End of Session OT - End of Session Activity Tolerance: Patient tolerated treatment well Patient left: in chair   Tura Roller, [place]ung 04/24/2012, 12:18 PM

## 2012-04-24 NOTE — Discharge Instructions (Addendum)
Near-Syncope Near-syncope is sudden weakness, dizziness, or feeling like you might pass out (faint). This can happen when getting up or while standing for a long time. It can be caused by a drop in blood pressure. It is common in people taking medicine for blood pressure. Fainting can happen when the blood pressure or pulse is too low. HOME CARE  If you feel like you are going to pass out:   Lie down right away.   Breathe deeply and steadily.   Move only when the feeling has gone away. Most of the time, this feeling lasts only a few minutes. You may feel tired for several hours.   Drink enough fluids to keep your pee (urine) clear or pale yellow.   If you are taking blood pressure or heart medicine, stand up slowly.  GET HELP RIGHT AWAY IF:   You have a severe headache.   Unusual pain develops in the chest, belly (abdomen), or back.   You have bleeding from the mouth or butt (rectum), or you have Mitchell Parker or tarry poop (stool).   You feel your heart beat differently than normal, or you have a very fast pulse.   You pass out, or you twitch and shake when you pass out.   You pass out when sitting or lying down.   You feel confused.   You have trouble walking.   You are weak.   You have vision problems.  MAKE SURE YOU:   Understand these instructions.   Will watch your condition.   Will get help right away if you are not doing well or get worse.  Document Released: 05/30/2008 Document Revised: 12/01/2011 Document Reviewed: 01/28/2011 [facility] Patient Information 2012 [place], [place].Syncope Syncope (fainting) is a sudden, short loss of consciousness. People normally fall to the ground when they faint. Recovery is often fast. HOME CARE  Do not drive or use machines. Wait until your doctor says it is safe to do so.   If you have diabetes, check your blood sugar. If it is low (below 70), you need to drink or eat something sweet. If over 300, call your doctor.   If you have  a blood pressure machine at home, take your blood pressure. If the top number is below 100 or above 170, call your doctor.   Lie down until you feel normal.   Drink extra fluids (water, juice, soup).  GET HELP RIGHT AWAY IF:   You pass out (faint) when sitting or lying down. Do not drive. Call your local emergency services (911 in [place] if no one is there to help you.   There is chest pain.   You feel sick to your stomach (nauseous) or keep throwing up (vomiting).   You have very bad belly (abdominal) pain.   You feel your heartbeat is fast or not normal.   You lose feeling in some part of the body.   You cannot move your arms or legs.   You cannot talk well and get confused.   You feel weak or cannot see well.   You get sweaty and feel lightheaded.  MAKE SURE YOU:   Understand these instructions.   Will watch your condition.   Will get help right away if you are not doing well or get worse.  Document Released: 05/30/2008 Document Revised: 12/01/2011 Document Reviewed: 05/30/2008 [facility] Patient Information 2012 [place], [place].

## 2012-04-24 NOTE — Progress Notes (Signed)
   CARE MANAGEMENT NOTE 04/24/2012  Patient:  PIER, BOSHER   Account Number:  [id number]  Date Initiated:  04/18/2012  Documentation initiated by:  Lanier Clam  Subjective/Objective Assessment:   ADMITTED W/SYNCOPE     Action/Plan:   FROM HOME ALONE   Anticipated DC Date:  04/24/2012   Anticipated DC Plan:  HOME W HOME HEALTH SERVICES         Choice offered to / List presented to:  C-1 Patient        HH arranged  HH-1 RN      [facility] agency  [facility]   Status of service:  Completed, signed off Medicare Important Message given?   (If response is "NO", the following Medicare IM given date fields will be blank) Date Medicare IM given:   Date Additional Medicare IM given:    Discharge Disposition:  HOME W HOME HEALTH SERVICES  Per UR Regulation:  Reviewed for med. necessity/level of care/duration of stay  If discussed at Long Length of Stay Meetings, dates discussed:    Comments:  04/24/12 Steph Cheadle RN,BSN NCM [phone] [facility] CHOSEN FOR HHRN-PT/INR CHECKS,DZ MGMNT.  04/23/12 Tailer Volkert RN,BSN NCM [phone] PROVIDED W/[facility]HC AGENCY LIST AS RESOURCE.NOTED PT/OT EVAL.IF D/C HOME W/LOVENOX-HAS COVERAGE W/MINIMAL CO-PAY.  04/18/12 Martavius Lusty RN,BSN NCM [phone]

## 2012-04-24 NOTE — Progress Notes (Signed)
   CARE MANAGEMENT NOTE 04/24/2012  Patient:  Mitchell Parker, Mitchell Parker   Account Number:  [id number]  Date Initiated:  04/18/2012  Documentation initiated by:  Lanier Clam  Subjective/Objective Assessment:   ADMITTED W/SYNCOPE     Action/Plan:   FROM HOME ALONE   Anticipated DC Date:  04/24/2012   Anticipated DC Plan:  HOME W HOME HEALTH SERVICES         Choice offered to / List presented to:  C-1 Patient        HH arranged  HH-1 RN      [facility] agency  [facility]   Status of service:  Completed, signed off Medicare Important Message given?   (If response is "NO", the following Medicare IM given date fields will be blank) Date Medicare IM given:   Date Additional Medicare IM given:    Discharge Disposition:  HOME W HOME HEALTH SERVICES  Per UR Regulation:  Reviewed for med. necessity/level of care/duration of stay  If discussed at Long Length of Stay Meetings, dates discussed:    Comments:  04/24/12 Anny Sayler RN,BSN NCM [phone] UPDATE:PT RECOMMENDED HH,BUT PATIENT FEELS IT IS NOT NECESSARY,HE SAYS HE IS GETTING UP,& MOVING AROUND ON HIS OWN.WITNESSED BY PT-JANINE,& HIS SPOUSE.MD UPDATED. [facility] CHOSEN FOR HHRN-PT/INR CHECKS,DZ MGMNT.  04/23/12 Dary Dilauro RN,BSN NCM [phone] PROVIDED W/HHC AGENCY LIST AS RESOURCE.NOTED PT/OT EVAL.IF D/C HOME W/LOVENOX-HAS COVERAGE W/MINIMAL CO-PAY.  04/18/12 Javanni Maring RN,BSN NCM [phone]

## 2012-04-26 LAB — CULTURE, BLOOD (ROUTINE X 2)
Culture  Setup Time: 201304261111
Culture  Setup Time: 201304261111
Culture: NO GROWTH
Culture: NO GROWTH

## 2012-05-22 ENCOUNTER — Inpatient Hospital Stay (HOSPITAL_COMMUNITY)
Admission: EM | Admit: 2012-05-22 | Discharge: 2012-05-27 | DRG: 554 | Disposition: A | Discharge status: Home / Self Care | Payer: Medicare Other | Attending: Internal Medicine | Admitting: Internal Medicine

## 2012-05-22 ENCOUNTER — Ambulatory Visit: Payer: Medicare Other | Admitting: Physical Therapy

## 2012-05-22 ENCOUNTER — Emergency Department (HOSPITAL_COMMUNITY): Payer: Medicare Other

## 2012-05-22 ENCOUNTER — Encounter (HOSPITAL_COMMUNITY): Payer: Self-pay | Admitting: *Deleted

## 2012-05-22 DIAGNOSIS — N184 Chronic kidney disease, stage 4 (severe): Secondary | ICD-10-CM | POA: Diagnosis present

## 2012-05-22 DIAGNOSIS — E871 Hypo-osmolality and hyponatremia: Secondary | ICD-10-CM | POA: Diagnosis present

## 2012-05-22 DIAGNOSIS — I011 Acute rheumatic endocarditis: Secondary | ICD-10-CM | POA: Diagnosis present

## 2012-05-22 DIAGNOSIS — R778 Other specified abnormalities of plasma proteins: Secondary | ICD-10-CM

## 2012-05-22 DIAGNOSIS — Z6841 Body Mass Index (BMI) 40.0 and over, adult: Secondary | ICD-10-CM

## 2012-05-22 DIAGNOSIS — M069 Rheumatoid arthritis, unspecified: Secondary | ICD-10-CM | POA: Diagnosis present

## 2012-05-22 DIAGNOSIS — K3184 Gastroparesis: Secondary | ICD-10-CM | POA: Diagnosis present

## 2012-05-22 DIAGNOSIS — K8020 Calculus of gallbladder without cholecystitis without obstruction: Secondary | ICD-10-CM

## 2012-05-22 DIAGNOSIS — I2699 Other pulmonary embolism without acute cor pulmonale: Secondary | ICD-10-CM | POA: Diagnosis present

## 2012-05-22 DIAGNOSIS — G5700 Lesion of sciatic nerve, unspecified lower limb: Secondary | ICD-10-CM

## 2012-05-22 DIAGNOSIS — N182 Chronic kidney disease, stage 2 (mild): Secondary | ICD-10-CM | POA: Diagnosis present

## 2012-05-22 DIAGNOSIS — I82409 Acute embolism and thrombosis of unspecified deep veins of unspecified lower extremity: Secondary | ICD-10-CM | POA: Diagnosis present

## 2012-05-22 DIAGNOSIS — E119 Type 2 diabetes mellitus without complications: Secondary | ICD-10-CM | POA: Diagnosis present

## 2012-05-22 DIAGNOSIS — R9431 Abnormal electrocardiogram [ECG] [EKG]: Secondary | ICD-10-CM

## 2012-05-22 DIAGNOSIS — M2550 Pain in unspecified joint: Secondary | ICD-10-CM

## 2012-05-22 DIAGNOSIS — R1110 Vomiting, unspecified: Secondary | ICD-10-CM | POA: Diagnosis present

## 2012-05-22 DIAGNOSIS — R55 Syncope and collapse: Secondary | ICD-10-CM

## 2012-05-22 DIAGNOSIS — I509 Heart failure, unspecified: Secondary | ICD-10-CM

## 2012-05-22 DIAGNOSIS — Z7901 Long term (current) use of anticoagulants: Secondary | ICD-10-CM

## 2012-05-22 DIAGNOSIS — J9610 Chronic respiratory failure, unspecified whether with hypoxia or hypercapnia: Secondary | ICD-10-CM | POA: Diagnosis present

## 2012-05-22 DIAGNOSIS — M109 Gout, unspecified: Principal | ICD-10-CM | POA: Diagnosis present

## 2012-05-22 DIAGNOSIS — I2782 Chronic pulmonary embolism: Secondary | ICD-10-CM | POA: Diagnosis present

## 2012-05-22 DIAGNOSIS — I129 Hypertensive chronic kidney disease with stage 1 through stage 4 chronic kidney disease, or unspecified chronic kidney disease: Secondary | ICD-10-CM | POA: Diagnosis present

## 2012-05-22 DIAGNOSIS — E1149 Type 2 diabetes mellitus with other diabetic neurological complication: Secondary | ICD-10-CM | POA: Diagnosis present

## 2012-05-22 DIAGNOSIS — E860 Dehydration: Secondary | ICD-10-CM

## 2012-05-22 DIAGNOSIS — G4733 Obstructive sleep apnea (adult) (pediatric): Secondary | ICD-10-CM | POA: Diagnosis present

## 2012-05-22 DIAGNOSIS — J449 Chronic obstructive pulmonary disease, unspecified: Secondary | ICD-10-CM

## 2012-05-22 DIAGNOSIS — K529 Noninfective gastroenteritis and colitis, unspecified: Secondary | ICD-10-CM

## 2012-05-22 DIAGNOSIS — I10 Essential (primary) hypertension: Secondary | ICD-10-CM | POA: Diagnosis present

## 2012-05-22 DIAGNOSIS — N189 Chronic kidney disease, unspecified: Secondary | ICD-10-CM

## 2012-05-22 DIAGNOSIS — Z9989 Dependence on other enabling machines and devices: Secondary | ICD-10-CM

## 2012-05-22 DIAGNOSIS — K8010 Calculus of gallbladder with chronic cholecystitis without obstruction: Secondary | ICD-10-CM

## 2012-05-22 DIAGNOSIS — N390 Urinary tract infection, site not specified: Secondary | ICD-10-CM | POA: Diagnosis present

## 2012-05-22 DIAGNOSIS — N1832 Chronic kidney disease, stage 3b: Secondary | ICD-10-CM | POA: Diagnosis present

## 2012-05-22 DIAGNOSIS — E876 Hypokalemia: Secondary | ICD-10-CM | POA: Diagnosis present

## 2012-05-22 LAB — COMPREHENSIVE METABOLIC PANEL
ALT: 15 U/L (ref 0–53)
AST: 19 U/L (ref 0–37)
Albumin: 3 g/dL — ABNORMAL LOW (ref 3.5–5.2)
Alkaline Phosphatase: 62 U/L (ref 39–117)
BUN: 14 mg/dL (ref 6–23)
CO2: 30 mEq/L (ref 19–32)
Calcium: 8.5 mg/dL (ref 8.4–10.5)
Chloride: 89 mEq/L — ABNORMAL LOW (ref 96–112)
Creatinine, Ser: 1.33 mg/dL (ref 0.50–1.35)
GFR calc Af Amer: 67 mL/min — ABNORMAL LOW (ref >90)
GFR calc non Af Amer: 57 mL/min — ABNORMAL LOW (ref >90)
Glucose, Bld: 243 mg/dL — ABNORMAL HIGH (ref 70–99)
Potassium: 2.8 mEq/L — ABNORMAL LOW (ref 3.5–5.1)
Sodium: 132 mEq/L — ABNORMAL LOW (ref 135–145)
Total Bilirubin: 1 mg/dL (ref 0.3–1.2)
Total Protein: 7 g/dL (ref 6.0–8.3)

## 2012-05-22 LAB — URINE MICROSCOPIC-ADD ON

## 2012-05-22 LAB — URINALYSIS, ROUTINE W REFLEX MICROSCOPIC
Bilirubin Urine: NEGATIVE
Glucose, UA: NEGATIVE mg/dL
Ketones, ur: NEGATIVE mg/dL
Leukocytes, UA: NEGATIVE
Nitrite: NEGATIVE
Protein, ur: 30 mg/dL — AB
Specific Gravity, Urine: 1.022 (ref 1.005–1.030)
Urobilinogen, UA: 1 mg/dL (ref 0.0–1.0)
pH: 5.5 (ref 5.0–8.0)

## 2012-05-22 LAB — TROPONIN I: Troponin I: <0.3 ng/mL (ref <0.30)

## 2012-05-22 LAB — CBC
HCT: 41.2 % (ref 39.0–52.0)
Hemoglobin: 13.8 g/dL (ref 13.0–17.0)
MCH: 27.4 pg (ref 26.0–34.0)
MCHC: 33.5 g/dL (ref 30.0–36.0)
MCV: 81.7 fL (ref 78.0–100.0)
Platelets: 229 10*3/uL (ref 150–400)
RBC: 5.04 MIL/uL (ref 4.22–5.81)
RDW: 14.9 % (ref 11.5–15.5)
WBC: 11 10*3/uL — ABNORMAL HIGH (ref 4.0–10.5)

## 2012-05-22 LAB — LIPASE, BLOOD: Lipase: 11 U/L (ref 11–59)

## 2012-05-22 LAB — URIC ACID: Uric Acid, Serum: 12.8 mg/dL — ABNORMAL HIGH (ref 4.0–7.8)

## 2012-05-22 MED ORDER — POTASSIUM CHLORIDE 10 MEQ/100ML IV SOLN
10.0000 meq | Freq: Once | INTRAVENOUS | Status: AC
  Administered 2012-05-22: 10 meq via INTRAVENOUS
  Filled 2012-05-22: qty 100

## 2012-05-22 MED ORDER — DEXTROSE 5 % IV SOLN
1.0000 g | INTRAVENOUS | Status: DC
  Administered 2012-05-23: 1 g via INTRAVENOUS
  Filled 2012-05-22: qty 10

## 2012-05-22 MED ORDER — POTASSIUM CHLORIDE CRYS ER 20 MEQ PO TBCR
40.0000 meq | EXTENDED_RELEASE_TABLET | Freq: Once | ORAL | Status: AC
  Administered 2012-05-22: 40 meq via ORAL
  Filled 2012-05-22: qty 2

## 2012-05-22 MED ORDER — MORPHINE SULFATE 4 MG/ML IJ SOLN
4.0000 mg | INTRAMUSCULAR | Status: AC | PRN
  Administered 2012-05-22 – 2012-05-23 (×3): 4 mg via INTRAVENOUS
  Filled 2012-05-22 (×3): qty 1

## 2012-05-22 NOTE — ED Notes (Signed)
ZOX:[id number]<VW> Expected date:05/22/12<BR> Expected time:<BR> Means of arrival:<BR> Comments:<BR> EMS 6 RK - gout/n/v

## 2012-05-22 NOTE — ED Notes (Signed)
Pt c/o n/v starting Friday night, then gout pain began Saturday. C/o pain in all extremities except L arm.

## 2012-05-22 NOTE — ED Notes (Signed)
Pt. Was gown ,put on a monitor 

## 2012-05-22 NOTE — ED Provider Notes (Signed)
History     CSN: [id number] Arrival date & time 05/22/12  1742 First MD Initiated Contact with Patient 05/22/12 1930      Chief Complaint  Patient presents with  . Gout  . Nausea  . Emesis   HPI Pt started having pain in his hands and his knees this weekend.  This feels similar to prior gout attacks.  This evening pt was having trouble getting up and walking so his wife brought him in for evaluation.  Pt has had some trouble with nausea and vomiting on Saturday and Sunday too but not yesterday or today.  No fevers, or cough.  No abdominal pain or chest pain.  No dysuria. He has noticed some redness in his right hand and arm he attributes to his gout.   Past Medical History  Diagnosis Date  . CHF (congestive heart failure)     H/o nonischemic CMP with EF 17% in 2003, however echo 09/2011 with EF 60-65%. Not ischemic by cath, no MI, stents, etc  . Diabetes mellitus     On insulin  . Neuropathy   . Hypertension   . COPD (chronic obstructive pulmonary disease)   . Gallstones   . RA (rheumatoid arthritis)   . Obese   . Kidney stones   . Piriformis syndrome     left hip  . Lumbar disc disease     compression   . Sleep apnea   . Gout   . Pulmonary emboli   . DVT (deep venous thrombosis)     Past Surgical History  Procedure Date  . Umbilical hernia repair   . Pilonidal cyst excision 1997  . Varicose vein surgery     left leg  . Cholecystectomy 01/18/2012    Procedure: LAPAROSCOPIC CHOLECYSTECTOMY WITH INTRAOPERATIVE CHOLANGIOGRAM;  Surgeon: Clovis Pu. Cornett, MD;  Location: [facility];  Service: General;  Laterality: N/A;    Family History  Problem Relation Age of Onset  . Hypertension Mother   . Diabetes Father   . Diabetes Brother     History  Substance Use Topics  . Smoking status: Former Smoker    Quit date: 12/26/1994  . Smokeless tobacco: Never Used  . Alcohol Use: No      Review of Systems  All other systems reviewed and are negative.    Allergies    Ciprofloxacin and Levofloxacin  Home Medications   Current Outpatient Rx  Name Route Sig Dispense Refill  . IPRATROPIUM-ALBUTEROL 18-103 MCG/ACT IN AERO Inhalation Inhale 2 puffs into the lungs every 6 (six) hours as needed. Wheezing    . AMLODIPINE BESYLATE 5 MG PO TABS Oral Take 1 tablet (5 mg total) by mouth daily.    . ASPIRIN 81 MG PO TABS Oral Take 81 mg by mouth 2 (two) times daily.     . ATORVASTATIN CALCIUM 20 MG PO TABS Oral Take 1 tablet (20 mg total) by mouth daily at 6 PM. 30 tablet 0  . CALCITRIOL 0.25 MCG PO CAPS Oral Take 0.25 mcg by mouth daily with breakfast.     . CARVEDILOL 12.5 MG PO TABS Oral Take 1 tablet (12.5 mg total) by mouth 2 (two) times daily with a meal. 30 tablet 0  . COLCHICINE 0.6 MG PO TABS Oral Take 1 tablet (0.6 mg total) by mouth daily. 30 tablet 0  . ENOXAPARIN SODIUM 150 MG/ML [place] SOLN Subcutaneous Inject 1 mL (150 mg total) into the skin daily. 1 Syringe 0  . FUROSEMIDE 80 MG PO  TABS Oral Take 80 mg by mouth 2 (two) times daily.      [name] GABAPENTIN 300 MG PO CAPS Oral Take 300 mg by mouth 2 (two) times daily.      [name] HYDROCODONE-ACETAMINOPHEN 7.5-325 MG PO TABS Oral Take 1 tablet by mouth every 8 (eight) hours as needed for pain. Pain 65 tablet 0  . HYDROXYCHLOROQUINE SULFATE PO Oral Take 300 mg by mouth 2 (two) times daily.    . INSULIN ASPART 100 UNIT/ML [place] SOLN Subcutaneous Inject 50 Units into the skin 3 (three) times daily before meals.    . INSULIN ISOPHANE HUMAN 100 UNIT/ML [place] SUSP Subcutaneous Inject 55 Units into the skin 2 (two) times daily. 1 vial 0  . METOCLOPRAMIDE HCL 5 MG PO TABS Oral Take 1 tablet (5 mg total) by mouth 4 (four) times daily -  before meals and at bedtime. 30 tablet 0  . PANTOPRAZOLE SODIUM 40 MG PO TBEC Oral Take 1 tablet (40 mg total) by mouth daily at 12 noon. 30 tablet 0  . PRAVASTATIN SODIUM 80 MG PO TABS Oral Take 80 mg by mouth at bedtime.      [name] PREDNISONE 10 MG PO TABS  Take 4 tabs 5/1, take 3 tabs 5/2 and 5/3, take  2 tabs 5/4 and 5/5 take 1 tab 5/6 and 5/7 then stop. 16 tablet 0  . TRAMADOL HCL 50 MG PO TABS Oral Take 50 mg by mouth every 8 (eight) hours as needed. Maximum dose= 8 tablets per day. Pain     . WARFARIN SODIUM 7.5 MG PO TABS Oral Take 1 tablet (7.5 mg total) by mouth daily. 3 tablet 0    BP 132/55  Pulse 86  Temp(Src) 100.5 F (38.1 C) (Oral)  Resp 18  SpO2 93%  Physical Exam  Nursing note and vitals reviewed. Constitutional: No distress.       Obese  HENT:  Head: Normocephalic and atraumatic.  Right Ear: External ear normal.  Left Ear: External ear normal.  Eyes: Conjunctivae are normal. Right eye exhibits no discharge. Left eye exhibits no discharge. No scleral icterus.  Neck: Neck supple. No tracheal deviation present.  Cardiovascular: Normal rate, regular rhythm and intact distal pulses.   Pulmonary/Chest: Effort normal and breath sounds normal. No stridor. No respiratory distress. He has no wheezes. He has no rales.  Abdominal: Soft. Bowel sounds are normal. He exhibits no distension. There is no tenderness. There is no rebound and no guarding.  Musculoskeletal: He exhibits edema and tenderness.       Diffusely tender in extremities, primarily right arm, and bilateral lower extremities, edema bilateral lower extremities, mild erythema hand and forearm  Neurological: He is alert. He has normal strength. No sensory deficit. Cranial nerve deficit:  no gross defecits noted. He exhibits normal muscle tone. He displays no seizure activity. Coordination normal.  Skin: Skin is warm and dry. No rash noted.  Psychiatric: He has a normal mood and affect.    ED Course  Procedures (including critical care time)  Medications  oxyCODONE-acetaminophen (PERCOCET) 7.5-500 MG per tablet (not administered)  metoCLOPramide (REGLAN) 5 MG tablet (not administered)  insulin NPH (HUMULIN N,NOVOLIN N) 100 UNIT/ML injection (not administered)  warfarin (COUMADIN) 2.5 MG tablet (not administered)   warfarin (COUMADIN) 5 MG tablet (not administered)  morphine 4 MG/ML injection 4 mg (4 mg Intravenous Given 05/22/12 2301)   Rate: 86  Rhythm: normal sinus rhythm  QRS Axis: normal  Intervals: normal  ST/T Wave abnormalities:  normal  Conduction Disutrbances:none  Narrative Interpretation: SINUS RHYTHM ~ normal P axis, V-rate 50- 99 NONSPECIFIC INTRAVENTRICULAR CONDUCTION DELAY ~ QRSd >149mS, not LBBB/RBBB  Old EKG Reviewed: none available   Labs Reviewed  CBC - Abnormal; Notable for the following:    WBC 11.0 (*)    All other components within normal limits  COMPREHENSIVE METABOLIC PANEL - Abnormal; Notable for the following:    Sodium 132 (*)    Potassium 2.8 (*)    Chloride 89 (*)    Glucose, Bld 243 (*)    Albumin 3.0 (*)    GFR calc non Af Amer 57 (*)    GFR calc Af Amer 67 (*)    All other components within normal limits  URIC ACID - Abnormal; Notable for the following:    Uric Acid, Serum 12.8 (*)    All other components within normal limits  URINALYSIS, ROUTINE W REFLEX MICROSCOPIC - Abnormal; Notable for the following:    APPearance CLOUDY (*)    Hgb urine dipstick LARGE (*)    Protein, ur 30 (*)    All other components within normal limits  URINE MICROSCOPIC-ADD ON - Abnormal; Notable for the following:    Bacteria, UA MANY (*)    All other components within normal limits  LIPASE, BLOOD  TROPONIN I   Dg Chest 2 View  05/22/2012  *RADIOLOGY REPORT*  Clinical Data: The nausea, vomiting, and weakness.  CHEST - 2 VIEW  Comparison: 04/17/2012  Findings: Shallow inspiration.  Mild cardiac enlargement with normal pulmonary vascularity.  Suggestion of mild linear atelectasis or infiltration in the lung bases, likely related to shallow inspiration.  No blunting of costophrenic angles.  No pneumothorax.  Mediastinal contents are prominent, likely due to technique.  Stable appearance since previous study.  IMPRESSION: Cardiac enlargement with linear atelectasis or infiltration  in the lung bases.  Shallow inspiration.  No significant interval change.  Original Report Authenticated By: Marlon Pel, M.D.     MDM  The patient is complaining diffuse myalgias. I suspect the symptoms are related to his gout or RA. I doubt septic arthritis or cellulitis based on his exam and findings. Patient also is feeling weak he is noted to be significantly hypokalemic. Patient does have a low-grade temperature and it could be related to his gout although His urinalysis does show many bacteria in this possibly could have an early urinary tract infection as well. We'll consult the hospitalist service regarding admission for continued treatment.        Celene Kras, MD 05/23/12 0900

## 2012-05-23 ENCOUNTER — Encounter (HOSPITAL_COMMUNITY): Payer: Self-pay | Admitting: Internal Medicine

## 2012-05-23 DIAGNOSIS — R112 Nausea with vomiting, unspecified: Secondary | ICD-10-CM

## 2012-05-23 DIAGNOSIS — IMO0001 Reserved for inherently not codable concepts without codable children: Secondary | ICD-10-CM

## 2012-05-23 DIAGNOSIS — N390 Urinary tract infection, site not specified: Secondary | ICD-10-CM | POA: Diagnosis present

## 2012-05-23 DIAGNOSIS — E876 Hypokalemia: Secondary | ICD-10-CM | POA: Diagnosis present

## 2012-05-23 DIAGNOSIS — E1165 Type 2 diabetes mellitus with hyperglycemia: Secondary | ICD-10-CM

## 2012-05-23 DIAGNOSIS — I011 Acute rheumatic endocarditis: Secondary | ICD-10-CM | POA: Diagnosis present

## 2012-05-23 DIAGNOSIS — M109 Gout, unspecified: Secondary | ICD-10-CM

## 2012-05-23 DIAGNOSIS — R1110 Vomiting, unspecified: Secondary | ICD-10-CM | POA: Diagnosis present

## 2012-05-23 LAB — COMPREHENSIVE METABOLIC PANEL
ALT: 12 U/L (ref 0–53)
AST: 16 U/L (ref 0–37)
Albumin: 2.8 g/dL — ABNORMAL LOW (ref 3.5–5.2)
Alkaline Phosphatase: 61 U/L (ref 39–117)
BUN: 16 mg/dL (ref 6–23)
CO2: 29 mEq/L (ref 19–32)
Calcium: 8.2 mg/dL — ABNORMAL LOW (ref 8.4–10.5)
Chloride: 90 mEq/L — ABNORMAL LOW (ref 96–112)
Creatinine, Ser: 1.31 mg/dL (ref 0.50–1.35)
GFR calc Af Amer: 68 mL/min — ABNORMAL LOW (ref >90)
GFR calc non Af Amer: 58 mL/min — ABNORMAL LOW (ref >90)
Glucose, Bld: 237 mg/dL — ABNORMAL HIGH (ref 70–99)
Potassium: 3.8 mEq/L (ref 3.5–5.1)
Sodium: 131 mEq/L — ABNORMAL LOW (ref 135–145)
Total Bilirubin: 1 mg/dL (ref 0.3–1.2)
Total Protein: 6.7 g/dL (ref 6.0–8.3)

## 2012-05-23 LAB — TSH: TSH: 1.757 u[IU]/mL (ref 0.350–4.500)

## 2012-05-23 LAB — PROTIME-INR
INR: 3.38 — ABNORMAL HIGH (ref 0.00–1.49)
Prothrombin Time: 34.7 seconds — ABNORMAL HIGH (ref 11.6–15.2)

## 2012-05-23 LAB — CBC
HCT: 39.5 % (ref 39.0–52.0)
Hemoglobin: 13.4 g/dL (ref 13.0–17.0)
MCH: 27.7 pg (ref 26.0–34.0)
MCHC: 33.9 g/dL (ref 30.0–36.0)
MCV: 81.8 fL (ref 78.0–100.0)
Platelets: 190 10*3/uL (ref 150–400)
RBC: 4.83 MIL/uL (ref 4.22–5.81)
RDW: 14.9 % (ref 11.5–15.5)
WBC: 11.9 10*3/uL — ABNORMAL HIGH (ref 4.0–10.5)

## 2012-05-23 LAB — GLUCOSE, CAPILLARY
Glucose-Capillary: 241 mg/dL — ABNORMAL HIGH (ref 70–99)
Glucose-Capillary: 251 mg/dL — ABNORMAL HIGH (ref 70–99)
Glucose-Capillary: 287 mg/dL — ABNORMAL HIGH (ref 70–99)
Glucose-Capillary: 309 mg/dL — ABNORMAL HIGH (ref 70–99)

## 2012-05-23 LAB — HEMOGLOBIN A1C
Hgb A1c MFr Bld: 7.3 % — ABNORMAL HIGH (ref <5.7)
Mean Plasma Glucose: 163 mg/dL — ABNORMAL HIGH (ref <117)

## 2012-05-23 LAB — MAGNESIUM: Magnesium: 1.7 mg/dL (ref 1.5–2.5)

## 2012-05-23 LAB — APTT: aPTT: 68 seconds — ABNORMAL HIGH (ref 24–37)

## 2012-05-23 MED ORDER — POTASSIUM CHLORIDE IN NACL 20-0.9 MEQ/L-% IV SOLN
INTRAVENOUS | Status: DC
  Administered 2012-05-23: 04:00:00 via INTRAVENOUS
  Filled 2012-05-23 (×4): qty 1000

## 2012-05-23 MED ORDER — METHYLPREDNISOLONE SODIUM SUCC 125 MG IJ SOLR
60.0000 mg | Freq: Once | INTRAMUSCULAR | Status: AC
  Administered 2012-05-23: 60 mg via INTRAVENOUS
  Filled 2012-05-23: qty 2

## 2012-05-23 MED ORDER — FUROSEMIDE 10 MG/ML IJ SOLN
40.0000 mg | Freq: Every day | INTRAMUSCULAR | Status: DC
  Administered 2012-05-23 – 2012-05-25 (×3): 40 mg via INTRAVENOUS
  Filled 2012-05-23 (×3): qty 4

## 2012-05-23 MED ORDER — INSULIN ASPART 100 UNIT/ML ~~LOC~~ SOLN
0.0000 [IU] | SUBCUTANEOUS | Status: DC
  Administered 2012-05-23: 11 [IU] via SUBCUTANEOUS
  Administered 2012-05-23 (×2): 15 [IU] via SUBCUTANEOUS
  Administered 2012-05-23: 7 [IU] via SUBCUTANEOUS
  Administered 2012-05-23 – 2012-05-24 (×3): 11 [IU] via SUBCUTANEOUS
  Administered 2012-05-24 (×2): 15 [IU] via SUBCUTANEOUS
  Administered 2012-05-24 (×2): 11 [IU] via SUBCUTANEOUS
  Administered 2012-05-25: 7 [IU] via SUBCUTANEOUS
  Administered 2012-05-25: 11 [IU] via SUBCUTANEOUS
  Administered 2012-05-25: 7 [IU] via SUBCUTANEOUS
  Administered 2012-05-25 (×2): 11 [IU] via SUBCUTANEOUS
  Administered 2012-05-25: 7 [IU] via SUBCUTANEOUS
  Administered 2012-05-26: 4 [IU] via SUBCUTANEOUS
  Administered 2012-05-26: 3 [IU] via SUBCUTANEOUS
  Administered 2012-05-26: 7 [IU] via SUBCUTANEOUS
  Administered 2012-05-27 (×2): 4 [IU] via SUBCUTANEOUS
  Administered 2012-05-27: 7 [IU] via SUBCUTANEOUS

## 2012-05-23 MED ORDER — ONDANSETRON HCL 4 MG/2ML IJ SOLN
INTRAMUSCULAR | Status: AC
  Filled 2012-05-23: qty 4

## 2012-05-23 MED ORDER — HYDROMORPHONE HCL PF 1 MG/ML IJ SOLN
1.0000 mg | INTRAMUSCULAR | Status: DC | PRN
  Administered 2012-05-23: 0.5 mg via INTRAVENOUS
  Administered 2012-05-23 (×3): 2 mg via INTRAVENOUS
  Administered 2012-05-24 (×3): 1 mg via INTRAVENOUS
  Administered 2012-05-24: 2 mg via INTRAVENOUS
  Administered 2012-05-25 (×4): 1 mg via INTRAVENOUS
  Filled 2012-05-23: qty 2
  Filled 2012-05-23 (×2): qty 1
  Filled 2012-05-23: qty 2
  Filled 2012-05-23 (×2): qty 1
  Filled 2012-05-23: qty 2
  Filled 2012-05-23 (×4): qty 1
  Filled 2012-05-23: qty 2

## 2012-05-23 MED ORDER — INSULIN ASPART 100 UNIT/ML ~~LOC~~ SOLN: 50.0000 [IU] | Freq: Three times a day (TID) | SUBCUTANEOUS | Status: DC

## 2012-05-23 MED ORDER — ACETAMINOPHEN 650 MG RE SUPP: 650.0000 mg | Freq: Four times a day (QID) | RECTAL | Status: DC | PRN

## 2012-05-23 MED ORDER — ONDANSETRON 8 MG/NS 50 ML IVPB
8.0000 mg | Freq: Once | INTRAVENOUS | Status: AC
  Administered 2012-05-23: 8 mg via INTRAVENOUS
  Filled 2012-05-23: qty 8

## 2012-05-23 MED ORDER — INSULIN NPH (HUMAN) (ISOPHANE) 100 UNIT/ML ~~LOC~~ SUSP
90.0000 [IU] | Freq: Two times a day (BID) | SUBCUTANEOUS | Status: DC
  Administered 2012-05-23 – 2012-05-27 (×8): 90 [IU] via SUBCUTANEOUS
  Filled 2012-05-23: qty 10

## 2012-05-23 MED ORDER — IPRATROPIUM-ALBUTEROL 20-100 MCG/ACT IN AERS
2.0000 | INHALATION_SPRAY | Freq: Four times a day (QID) | RESPIRATORY_TRACT | Status: DC | PRN
  Filled 2012-05-23: qty 4

## 2012-05-23 MED ORDER — ACETAMINOPHEN 325 MG PO TABS: 650.0000 mg | ORAL_TABLET | Freq: Four times a day (QID) | ORAL | Status: DC | PRN

## 2012-05-23 MED ORDER — POTASSIUM CHLORIDE 10 MEQ/100ML IV SOLN
10.0000 meq | INTRAVENOUS | Status: AC
  Administered 2012-05-23 (×2): 10 meq via INTRAVENOUS
  Filled 2012-05-23 (×2): qty 100

## 2012-05-23 MED ORDER — ASPIRIN 81 MG PO TABS: 81.0000 mg | ORAL_TABLET | Freq: Two times a day (BID) | ORAL | Status: DC

## 2012-05-23 MED ORDER — DEXTROSE 5 % IV SOLN
1.0000 g | INTRAVENOUS | Status: DC
  Administered 2012-05-23 – 2012-05-25 (×3): 1 g via INTRAVENOUS
  Filled 2012-05-23 (×4): qty 10

## 2012-05-23 MED ORDER — ONDANSETRON HCL 4 MG PO TABS: 4.0000 mg | ORAL_TABLET | ORAL | Status: DC | PRN

## 2012-05-23 MED ORDER — COLCHICINE 0.6 MG PO TABS
0.6000 mg | ORAL_TABLET | Freq: Two times a day (BID) | ORAL | Status: DC
  Administered 2012-05-23 – 2012-05-27 (×9): 0.6 mg via ORAL
  Filled 2012-05-23 (×10): qty 1

## 2012-05-23 MED ORDER — METOCLOPRAMIDE HCL 5 MG PO TABS
5.0000 mg | ORAL_TABLET | Freq: Two times a day (BID) | ORAL | Status: DC
  Administered 2012-05-23 – 2012-05-25 (×4): 5 mg via ORAL
  Filled 2012-05-23 (×5): qty 1

## 2012-05-23 MED ORDER — ONDANSETRON HCL 4 MG/2ML IJ SOLN
4.0000 mg | INTRAMUSCULAR | Status: DC | PRN
  Administered 2012-05-24: 4 mg via INTRAVENOUS
  Filled 2012-05-23: qty 2

## 2012-05-23 MED ORDER — IPRATROPIUM-ALBUTEROL 18-103 MCG/ACT IN AERO
2.0000 | INHALATION_SPRAY | Freq: Four times a day (QID) | RESPIRATORY_TRACT | Status: DC | PRN
  Filled 2012-05-23: qty 14.7

## 2012-05-23 MED ORDER — BIOTENE DRY MOUTH MT LIQD
15.0000 mL | Freq: Two times a day (BID) | OROMUCOSAL | Status: DC
  Administered 2012-05-23 – 2012-05-27 (×9): 15 mL via OROMUCOSAL

## 2012-05-23 MED ORDER — METHYLPREDNISOLONE SODIUM SUCC 40 MG IJ SOLR
40.0000 mg | Freq: Four times a day (QID) | INTRAMUSCULAR | Status: DC
  Administered 2012-05-23 (×2): 40 mg via INTRAVENOUS
  Filled 2012-05-23 (×2): qty 1

## 2012-05-23 MED ORDER — HYDROMORPHONE HCL PF 1 MG/ML IJ SOLN
0.5000 mg | INTRAMUSCULAR | Status: DC | PRN
  Administered 2012-05-23 (×4): 0.5 mg via INTRAVENOUS
  Filled 2012-05-23 (×4): qty 1

## 2012-05-23 MED ORDER — FLEET ENEMA 7-19 GM/118ML RE ENEM: 1.0000 | ENEMA | Freq: Once | RECTAL | Status: AC | PRN

## 2012-05-23 MED ORDER — METHYLPREDNISOLONE SODIUM SUCC 40 MG IJ SOLR
40.0000 mg | Freq: Two times a day (BID) | INTRAMUSCULAR | Status: DC
  Administered 2012-05-24 (×2): 40 mg via INTRAVENOUS
  Filled 2012-05-23 (×3): qty 1

## 2012-05-23 MED ORDER — ASPIRIN 81 MG PO CHEW
81.0000 mg | CHEWABLE_TABLET | Freq: Two times a day (BID) | ORAL | Status: DC
  Administered 2012-05-23 – 2012-05-27 (×9): 81 mg via ORAL
  Filled 2012-05-23 (×10): qty 1

## 2012-05-23 NOTE — Progress Notes (Signed)
Patient ID: Mitchell Cuffe., male   DOB: September 09, 1951, 61 y.o.   MRN: [id number]  Subjective: No events overnight. Patient denies chest pain, shortness of breath, abdominal pain. Pt still reports pain in the toes and says it is from gout.  Objective:  Vital signs in last 24 hours:  Filed Vitals:   05/22/12 2047 05/23/12 0437 05/23/12 1007 05/23/12 1409  BP: 152/58 151/76 150/78 130/73  Pulse: 85 89 90 84  Temp:  100 F (37.8 C) 99.8 F (37.7 C) 99.5 F (37.5 C)  TempSrc:  Oral Oral Oral  Resp: 18 22 20 20   Height:  6\' 1"  (1.854 m)    Weight:  154.1 kg (339 lb 11.7 oz)    SpO2: 95% 94% 96% 95%    Intake/Output from previous day:  Intake/Output Summary (Last 24 hours) at 05/23/12 1441 Last data filed at 05/23/12 1413  Gross per 24 hour  Intake 528.33 ml  Output   1000 ml  Net -471.67 ml    Physical Exam: General: Alert, awake, oriented x3, in no acute distress. Morbidly obese HEENT: No bruits, no goiter. Moist mucous membranes, no scleral icterus, no conjunctival pallor. Heart: Regular rate and rhythm, S1/S2 +, no murmurs, rubs, gallops. Lungs: Clear to auscultation bilaterally with decreased breath sounds at bases. No wheezing, no rhonchi, no rales.  Abdomen: Soft, nontender, nondistended, positive bowel sounds. Extremities: No clubbing or cyanosis, bilateral +1 pitting edema,  positive pedal pulses, chronic venous stasis changes Neuro: Grossly nonfocal.  Lab Results:  Lab 05/23/12 0530 05/22/12 2127  WBC 11.9* 11.0*  HGB 13.4 13.8  HCT 39.5 41.2  PLT 190 229    Lab 05/23/12 0530 05/22/12 2127  NA 131* 132*  K 3.8 2.8*  CL 90* 89*  CO2 29 30  GLUCOSE 237* 243*  BUN 16 14  CREATININE 1.31 1.33  CALCIUM 8.2* 8.5  MG 1.7 --    Lab 05/23/12 0530  INR 3.38*  PROTIME --   Cardiac markers:  Lab 05/22/12 2129  CKMB --  TROPONINI <0.30  MYOGLOBIN --   Studies/Results:  Dg Chest 2 View 05/22/2012   IMPRESSION:  Cardiac enlargement with linear  atelectasis or infiltration in the lung bases.   Shallow inspiration.  No significant interval change.    Medications: Scheduled Meds:   . antiseptic oral rinse  15 mL Mouth Rinse BID  . cefTRIAXone  IV  1 g Intravenous Q24H  . insulin aspart  0-20 Units Subcutaneous Q4H  . methylPREDNISolone   40 mg Intravenous Q6H  . ondansetron  IV  8 mg Intravenous Once  . potassium chloride  10 mEq Intravenous Once  . potassium chloride  10 mEq Intravenous Q1 Hr x 2  . potassium chloride  40 mEq Oral Once   Continuous Infusions:   . 0.9 % NaCl with KCl 20 mEq / L 100 mL/hr at 05/23/12 0407   PRN Meds:.acetaminophen, acetaminophen, HYDROmorphone (DILAUDID) injection, morphine injection, ondansetron (ZOFRAN) IV, ondansetron, sodium phosphate, DISCONTD:  HYDROmorphone (DILAUDID) injection  Assessment/Plan:  Active Problems:  Gout, uncontrolled and recurrent - mild clinical improvement - will decrease the dose of solumedrol and plan to transition to PO - will also initiate Colchicine BID and will follow up on daily BMP - will consider Allopurinol upon doscharge   Hypertension, uncontrolled and with complications - will reinitiate Lasix and will d/c IVF - hold of on Coreg and continue to monitor vitals per floor protocol - readjust the regimen as indicated  Obesity, morbid (more than 100 lbs over ideal weight or BMI > 40) - nutrition consultation provided by myself   Chronic respiratory failure secondary to OSA, on CPAP - will continue to monitor oxygen saturations as per floor protocol   Diabetes mellitus, uncontrolled and with complications GASTROPARESIS - pt is currently NPO and will advance the diet to clear liquids - will start insulin as per home medication regimen - monitor CBG as per floor prtocol   Pulmonary embolism - clinically stable - will continue coumadin per pharmacy for dosing - appreciate input   DVT (deep venous thrombosis) - will also continue Coumadin    UTI  (lower urinary tract infection), not catheter related - continue Rocephin - follow up on Urine culture   Hypokalemia - supplemented - within normal limits this AM   Vomiting - secondary to gastroparesis - will advance the diet to clear liquids - continue supportive care with antiemetics prn  Chronic kidney disease stage II - appears to be stable and at pt's baseline  Hyponatremia - secondary to volume status - stable and at pt's baseline    EDUCATION - test results and diagnostic studies were discussed with patient and pt's family who was present at the bedside - patient and family have verbalized the understanding - questions were answered at the bedside and contact information was provided for additional questions or concerns   LOS: 1 day   MAGICK-Adianna Darwin 05/23/2012, 2:41 PM  [facility] Pager: [phone]

## 2012-05-23 NOTE — Progress Notes (Signed)
PT Cancellation Note  Treatment cancelled today due to medical issues with patient which prohibited therapy. Pt stated his gout pain is 8/10 at present lying in bed, he's had pain meds and cannot tolerate movement due to increased pain. Will re-attempt tomorrow. RN aware.  Ralene Bathe Kistler 05/23/2012, 11:57 AM [phone]

## 2012-05-23 NOTE — H&P (Signed)
PCP:   Toma Deiters, MD, MD   Rheumatologist and endocrinologist: [facility] in [place], [place]  Chief Complaint:  Pains all over since yesterday  HPI: Mitchell Parker. is an 61 y.o. male.   Multiple medical problems including morbid obesity, gout, rheumatoid arthritis, diabetes, hypertension, obstructive sleep apnea on CPAP, persistent recurrent vomiting episodes of unclear etiology, gastroparesis, status post cholecystectomy.  Patient started to her family about her persistent vomiting over the weekend Saturday and Sunday vomited over 20 times, copious amounts of green material. Eased up yesterday but then he started having acute pain and swelling in the left knee, which has spread now to almost all his joints including both knees both ankles the joints in his right upper extremity. His left upper extremity has been spared,  Having no muscular pain and no fevers, runny nose nor cough; Denies diarrhea.  He's lost about 40 pounds over the past few months Usually ambulates without assistance of a device  Rewiew of Systems:  The patient denies anorexia, fever, , vision loss, decreased hearing, hoarseness, chest pain, syncope, dyspnea on exertion, balance deficits, hemoptysis, abdominal pain, melena, hematochezia, severe indigestion/heartburn, hematuria, incontinence, genital sores, muscle weakness, suspicious skin lesions, transient blindness, difficulty walking, depression, unusual weight change, abnormal bleeding, enlarged lymph nodes, angioedema, and breast masses.    Past Medical History  Diagnosis Date  . CHF (congestive heart failure)     H/o nonischemic CMP with EF 17% in 2003, however echo 09/2011 with EF 60-65%. Not ischemic by cath, no MI, stents, etc  . Diabetes mellitus     On insulin  . Neuropathy   . Hypertension   . COPD (chronic obstructive pulmonary disease)   . Gallstones   . RA (rheumatoid arthritis)   . Obese   . Kidney stones   . Piriformis syndrome       left hip  . Lumbar disc disease     compression   . Sleep apnea   . Gout   . Pulmonary emboli   . DVT (deep venous thrombosis)     Past Surgical History  Procedure Date  . Umbilical hernia repair   . Pilonidal cyst excision 1997  . Varicose vein surgery     left leg  . Cholecystectomy 01/18/2012    Procedure: LAPAROSCOPIC CHOLECYSTECTOMY WITH INTRAOPERATIVE CHOLANGIOGRAM;  Surgeon: Clovis Pu. Cornett, MD;  Location: [facility];  Service: General;  Laterality: N/A;    Medications:  HOME MEDS: Prior to Admission medications   Medication Sig Start Date End Date Taking? Authorizing Provider  albuterol-ipratropium (COMBIVENT) 18-103 MCG/ACT inhaler Inhale 2 puffs into the lungs every 6 (six) hours as needed. Wheezing   Yes Historical Provider, MD  aspirin 81 MG tablet Take 81 mg by mouth 2 (two) times daily.    Yes Historical Provider, MD  atorvastatin (LIPITOR) 20 MG tablet Take 1 tablet (20 mg total) by mouth daily at 6 PM. 04/24/12 04/24/13 Yes Gwenyth Bender, NP  calcitRIOL (ROCALTROL) 0.25 MCG capsule Take 0.25 mcg by mouth daily with breakfast.    Yes Historical Provider, MD  carvedilol (COREG) 12.5 MG tablet Take 1 tablet (12.5 mg total) by mouth 2 (two) times daily with a meal. 04/24/12 04/24/13 Yes Lesle Chris Black, NP  colchicine 0.6 MG tablet Take 1 tablet (0.6 mg total) by mouth daily. 04/24/12 04/24/13 Yes Lesle Chris Black, NP  furosemide (LASIX) 80 MG tablet Take 80 mg by mouth 2 (two) times daily.     Yes Historical  Provider, MD  gabapentin (NEURONTIN) 300 MG capsule Take 300 mg by mouth 2 (two) times daily.     Yes Historical Provider, MD  HYDROXYCHLOROQUINE SULFATE PO Take 300 mg by mouth 2 (two) times daily.   Yes Historical Provider, MD  insulin aspart (NOVOLOG) 100 UNIT/ML injection Inject 50 Units into the skin 3 (three) times daily before meals.   Yes Historical Provider, MD  insulin NPH (HUMULIN N,NOVOLIN N) 100 UNIT/ML injection Inject 90 Units into the skin 2 (two) times daily  before a meal.   Yes Historical Provider, MD  metoCLOPramide (REGLAN) 5 MG tablet Take 5 mg by mouth 2 (two) times daily.   Yes Historical Provider, MD  oxyCODONE-acetaminophen (PERCOCET) 7.5-500 MG per tablet Take 1 tablet by mouth every 6 (six) hours as needed. For pain.   Yes Historical Provider, MD  pravastatin (PRAVACHOL) 80 MG tablet Take 80 mg by mouth at bedtime.     Yes Historical Provider, MD  traMADol (ULTRAM) 50 MG tablet Take 50 mg by mouth 2 (two) times daily.    Yes Historical Provider, MD  warfarin (COUMADIN) 2.5 MG tablet Take 2.5 mg by mouth daily. Taken with 5mg  tab to equal 7.5mg  daily.   Yes Historical Provider, MD  warfarin (COUMADIN) 5 MG tablet Take 5 mg by mouth daily. Taken with 2.5mg  daily to equal 7.5mg .   Yes Historical Provider, MD     Allergies:  Allergies  Allergen Reactions  . Ciprofloxacin Hives  . Levofloxacin (Levofloxacin) Hives    Social History:   reports that he quit smoking about 17 years ago. He has never used smokeless tobacco. He reports that he does not drink alcohol or use illicit drugs.  Family History: Family History  Problem Relation Age of Onset  . Hypertension Mother   . Diabetes Father   . Diabetes Brother      Physical Exam: Filed Vitals:   05/22/12 1753 05/22/12 1851 05/22/12 2022 05/22/12 2047  BP: 132/55  155/65 152/58  Pulse: 86  84 85  Temp: 100.7 F (38.2 C) 100.5 F (38.1 C) 100.2 F (37.9 C)   TempSrc: Oral  Oral   Resp: 18  18 18   SpO2: 93%  96% 95%   Blood pressure 152/58, pulse 85, temperature 100.2 F (37.9 C), temperature source Oral, resp. rate 18, SpO2 95.00%.  GEN:  Morbidly obese Caucasian gentleman reclining in the stretcher in painful distress; cooperative with exam PSYCH:  alert and oriented x4;  affect is appropriate. HEENT: Mucous membranes pink and anicteric; PERRLA; EOM intact; very thick neck no cervical lymphadenopathy nor thyromegaly or carotid bruit; no JVD; Breasts:: Not examined CHEST  WALL: No tenderness CHEST: Normal respiration, clear to auscultation bilaterally HEART: Regular rate and rhythm; no murmurs rubs or gallops BACK: ; no CVA tenderness ABDOMEN: Obese, soft non-tender; no masses, no organomegaly, normal abdominal bowel sounds; ; no intertriginous candida. Rectal Exam: Not done EXTREMITIES: Tenderness and warmth of both knees especially; but also ankles; 1+edema; no ulcerations. Genitalia: not examined PULSES: 2+ and symmetric SKIN: Normal hydration no rash or ulceration CNS: Cranial nerves 2-12 grossly intact no focal lateralizing neurologic deficit   Labs & Imaging Results for orders placed during the hospital encounter of 05/22/12 (from the past 48 hour(s))  CBC     Status: Abnormal   Collection Time   05/22/12  9:27 PM      Component Value Range Comment   WBC 11.0 (*) 4.0 - 10.5 (K/uL)    RBC 5.04  4.22 - 5.81 (MIL/uL)    Hemoglobin 13.8  13.0 - 17.0 (g/dL)    HCT 16.1  09.6 - 04.5 (%)    MCV 81.7  78.0 - 100.0 (fL)    MCH 27.4  26.0 - 34.0 (pg)    MCHC 33.5  30.0 - 36.0 (g/dL)    RDW 40.9  81.1 - 91.4 (%)    Platelets 229  150 - 400 (K/uL)   COMPREHENSIVE METABOLIC PANEL     Status: Abnormal   Collection Time   05/22/12  9:27 PM      Component Value Range Comment   Sodium 132 (*) 135 - 145 (mEq/L)    Potassium 2.8 (*) 3.5 - 5.1 (mEq/L)    Chloride 89 (*) 96 - 112 (mEq/L)    CO2 30  19 - 32 (mEq/L)    Glucose, Bld 243 (*) 70 - 99 (mg/dL)    BUN 14  6 - 23 (mg/dL)    Creatinine, Ser 7.82  0.50 - 1.35 (mg/dL)    Calcium 8.5  8.4 - 10.5 (mg/dL)    Total Protein 7.0  6.0 - 8.3 (g/dL)    Albumin 3.0 (*) 3.5 - 5.2 (g/dL)    AST 19  0 - 37 (U/L)    ALT 15  0 - 53 (U/L)    Alkaline Phosphatase 62  39 - 117 (U/L)    Total Bilirubin 1.0  0.3 - 1.2 (mg/dL)    GFR calc non Af Amer 57 (*) >90 (mL/min)    GFR calc Af Amer 67 (*) >90 (mL/min)   LIPASE, BLOOD     Status: Normal   Collection Time   05/22/12  9:27 PM      Component Value Range Comment    Lipase 11  11 - 59 (U/L)   URIC ACID     Status: Abnormal   Collection Time   05/22/12  9:27 PM      Component Value Range Comment   Uric Acid, Serum 12.8 (*) 4.0 - 7.8 (mg/dL)   TROPONIN I     Status: Normal   Collection Time   05/22/12  9:29 PM      Component Value Range Comment   Troponin I <0.30  <0.30 (ng/mL)   URINALYSIS, ROUTINE W REFLEX MICROSCOPIC     Status: Abnormal   Collection Time   05/22/12  9:33 PM      Component Value Range Comment   Color, Urine YELLOW  YELLOW     APPearance CLOUDY (*) CLEAR     Specific Gravity, Urine 1.022  1.005 - 1.030     pH 5.5  5.0 - 8.0     Glucose, UA NEGATIVE  NEGATIVE (mg/dL)    Hgb urine dipstick LARGE (*) NEGATIVE     Bilirubin Urine NEGATIVE  NEGATIVE     Ketones, ur NEGATIVE  NEGATIVE (mg/dL)    Protein, ur 30 (*) NEGATIVE (mg/dL)    Urobilinogen, UA 1.0  0.0 - 1.0 (mg/dL)    Nitrite NEGATIVE  NEGATIVE     Leukocytes, UA NEGATIVE  NEGATIVE    URINE MICROSCOPIC-ADD ON     Status: Abnormal   Collection Time   05/22/12  9:33 PM      Component Value Range Comment   Squamous Epithelial / LPF RARE  RARE     WBC, UA 0-2  <3 (WBC/hpf)    RBC / HPF 7-10  <3 (RBC/hpf)    Bacteria, UA MANY (*) RARE  Urine-Other MUCOUS PRESENT      Dg Chest 2 View  05/22/2012  *RADIOLOGY REPORT*  Clinical Data: The nausea, vomiting, and weakness.  CHEST - 2 VIEW  Comparison: 04/17/2012  Findings: Shallow inspiration.  Mild cardiac enlargement with normal pulmonary vascularity.  Suggestion of mild linear atelectasis or infiltration in the lung bases, likely related to shallow inspiration.  No blunting of costophrenic angles.  No pneumothorax.  Mediastinal contents are prominent, likely due to technique.  Stable appearance since previous study.  IMPRESSION: Cardiac enlargement with linear atelectasis or infiltration in the lung bases.  Shallow inspiration.  No significant interval change.  Original Report Authenticated By: Marlon Pel, M.D.       Assessment Present on Admission:  .UTI (lower urinary tract infection) .Hypokalemia .Vomiting .Gout .Hypertension .Obesity, morbid (more than 100 lbs over ideal weight or BMI > 40) .Diabetes mellitus .OSA on CPAP .Pulmonary embolism .DVT (deep venous thrombosis) .Rheumatoid arthrtitis   PLAN: Admit this gentleman for  pain control; will give steroid as of his vomiting and history of renal insufficiency; Should be able to switch to oral steroids and a couple of days We'll keep him n.p.o. and given high-dose SSI.  Other plans as per orders.  Josephine Rudnick 05/23/2012, 2:12 AM

## 2012-05-23 NOTE — ED Notes (Signed)
Attempted to call report. Floor RN unable to accept report.  

## 2012-05-23 NOTE — Progress Notes (Signed)
   CARE MANAGEMENT NOTE 05/23/2012  Patient:  Mitchell Parker, Mitchell Parker   Account Number:  [id number]  Date Initiated:  05/23/2012  Documentation initiated by:  Jiles Crocker  Subjective/Objective Assessment:   ADMITTED WITH UTI, VOMITING     Action/Plan:   PCP: Toma Deiters, MD; LIVES AT HOME WITH SPOUSE; RECEIVING HHC WITH [facility]   Anticipated DC Date:  05/30/2012   Anticipated DC Plan:  HOME W HOME HEALTH SERVICES      DC Planning Services  CM consult            Status of service:  In process, will continue to follow Medicare Important Message given?  NA - LOS <3 / Initial given by admissions (If response is "NO", the following Medicare IM given date fields will be blank)  Per UR Regulation:  Reviewed for med. necessity/level of care/duration of stay  Comments:  05/23/2012- B Terryann Verbeek RN, BSN, MHA

## 2012-05-23 NOTE — Progress Notes (Addendum)
ANTICOAGULATION CONSULT NOTE - Initial Consult  Pharmacy Consult for Warfarin Indication: History of DVT/PE  Allergies  Allergen Reactions  . Ciprofloxacin Hives  . Levofloxacin (Levofloxacin) Hives    Patient Measurements: Height: 6\' 1"  (185.4 cm) Weight: 339 lb 11.7 oz (154.1 kg) IBW/kg (Calculated) : 79.9    Vital Signs: Temp: 100 F (37.8 C) (05/29 0437) Temp src: Oral (05/29 0437) BP: 151/76 mmHg (05/29 0437) Pulse Rate: 89  (05/29 0437)  Labs:  Basename 05/23/12 0530 05/22/12 2129 05/22/12 2127  HGB 13.4 -- 13.8  HCT 39.5 -- 41.2  PLT 190 -- 229  APTT 68* -- --  LABPROT 34.7* -- --  INR 3.38* -- --  HEPARINUNFRC -- -- --  CREATININE 1.31 -- 1.33  CKTOTAL -- -- --  CKMB -- -- --  TROPONINI -- <0.30 --    Estimated Creatinine Clearance: 95.3 ml/min (by C-G formula based on Cr of 1.31).   Medical History: Past Medical History  Diagnosis Date  . CHF (congestive heart failure)     H/o nonischemic CMP with EF 17% in 2003, however echo 09/2011 with EF 60-65%. Not ischemic by cath, no MI, stents, etc  . Diabetes mellitus     On insulin  . Neuropathy   . Hypertension   . COPD (chronic obstructive pulmonary disease)   . Gallstones   . RA (rheumatoid arthritis)   . Obese   . Kidney stones   . Piriformis syndrome     left hip  . Lumbar disc disease     compression   . Sleep apnea   . Gout   . Pulmonary emboli   . DVT (deep venous thrombosis)     Medications:  Scheduled:    . antiseptic oral rinse  15 mL Mouth Rinse BID  . cefTRIAXone (ROCEPHIN)  IV  1 g Intravenous Q24H  . insulin aspart  0-20 Units Subcutaneous Q4H  . methylPREDNISolone (SOLU-MEDROL) injection  60 mg Intravenous Once   Followed by  . methylPREDNISolone (SOLU-MEDROL) injection  40 mg Intravenous Q6H  . ondansetron      . ondansetron (ZOFRAN) IV  8 mg Intravenous Once  . potassium chloride  10 mEq Intravenous Once  . potassium chloride  10 mEq Intravenous Q1 Hr x 2  . potassium  chloride  40 mEq Oral Once  . DISCONTD: cefTRIAXone (ROCEPHIN)  IV  1 g Intravenous Q24H   Infusions:    . 0.9 % NaCl with KCl 20 mEq / L 100 mL/hr at 05/23/12 0407    Assessment: 61 yo male admitted with gout pain, on chronic coumadin for history of DVT/PE.  Goal of Therapy:  INR 2-3    Plan:   Daily PT/INR.  Education.  Susanne Greenhouse R 05/23/2012,6:31 AM   ADDENDUM: Admission INR supratherapeutic. Home warfarin dose reported as 7.5mg  daily - last taken 5/27.  1) No warfarin tonight. 2) Plan to resume warfarin dosing once INR <3.  Darrol Angel, PharmD Pager: [phone] 05/23/2012 7:31 AM

## 2012-05-24 DIAGNOSIS — E1165 Type 2 diabetes mellitus with hyperglycemia: Secondary | ICD-10-CM

## 2012-05-24 DIAGNOSIS — IMO0001 Reserved for inherently not codable concepts without codable children: Secondary | ICD-10-CM

## 2012-05-24 DIAGNOSIS — M109 Gout, unspecified: Secondary | ICD-10-CM

## 2012-05-24 DIAGNOSIS — R112 Nausea with vomiting, unspecified: Secondary | ICD-10-CM

## 2012-05-24 DIAGNOSIS — N390 Urinary tract infection, site not specified: Secondary | ICD-10-CM

## 2012-05-24 LAB — CBC
HCT: 38.7 % — ABNORMAL LOW (ref 39.0–52.0)
Hemoglobin: 13.1 g/dL (ref 13.0–17.0)
MCH: 27 pg (ref 26.0–34.0)
MCHC: 33.9 g/dL (ref 30.0–36.0)
MCV: 79.6 fL (ref 78.0–100.0)
Platelets: 240 10*3/uL (ref 150–400)
RBC: 4.86 MIL/uL (ref 4.22–5.81)
RDW: 14 % (ref 11.5–15.5)
WBC: 9 10*3/uL (ref 4.0–10.5)

## 2012-05-24 LAB — GLUCOSE, CAPILLARY
Glucose-Capillary: 262 mg/dL — ABNORMAL HIGH (ref 70–99)
Glucose-Capillary: 279 mg/dL — ABNORMAL HIGH (ref 70–99)
Glucose-Capillary: 290 mg/dL — ABNORMAL HIGH (ref 70–99)
Glucose-Capillary: 299 mg/dL — ABNORMAL HIGH (ref 70–99)
Glucose-Capillary: 318 mg/dL — ABNORMAL HIGH (ref 70–99)
Glucose-Capillary: 334 mg/dL — ABNORMAL HIGH (ref 70–99)
Glucose-Capillary: 339 mg/dL — ABNORMAL HIGH (ref 70–99)

## 2012-05-24 LAB — BASIC METABOLIC PANEL
BUN: 20 mg/dL (ref 6–23)
CO2: 29 mEq/L (ref 19–32)
Calcium: 8.6 mg/dL (ref 8.4–10.5)
Chloride: 91 mEq/L — ABNORMAL LOW (ref 96–112)
Creatinine, Ser: 1.15 mg/dL (ref 0.50–1.35)
GFR calc Af Amer: 79 mL/min — ABNORMAL LOW (ref >90)
GFR calc non Af Amer: 68 mL/min — ABNORMAL LOW (ref >90)
Glucose, Bld: 278 mg/dL — ABNORMAL HIGH (ref 70–99)
Potassium: 3.7 mEq/L (ref 3.5–5.1)
Sodium: 131 mEq/L — ABNORMAL LOW (ref 135–145)

## 2012-05-24 LAB — URINE CULTURE
Colony Count: NO GROWTH
Culture  Setup Time: 201305290402
Culture: NO GROWTH

## 2012-05-24 LAB — PROTIME-INR
INR: 3.44 — ABNORMAL HIGH (ref 0.00–1.49)
Prothrombin Time: 35.2 seconds — ABNORMAL HIGH (ref 11.6–15.2)

## 2012-05-24 MED ORDER — CARVEDILOL 12.5 MG PO TABS
12.5000 mg | ORAL_TABLET | Freq: Two times a day (BID) | ORAL | Status: DC
  Administered 2012-05-24 – 2012-05-26 (×4): 12.5 mg via ORAL
  Filled 2012-05-24 (×7): qty 1

## 2012-05-24 MED ORDER — PREDNISONE 50 MG PO TABS
50.0000 mg | ORAL_TABLET | Freq: Every day | ORAL | Status: DC
  Administered 2012-05-25: 50 mg via ORAL
  Filled 2012-05-24 (×3): qty 1

## 2012-05-24 NOTE — Progress Notes (Signed)
Patient complained of feeling like his bladder was full but being unable to void.  Bladder scan was performed which showed greater than 700 cc.  RN stepped out to call MD but patient then called out to report that he had begun to spontaneously void on his own.  750 cc emptied from condom cath.  Will continue to monitor patient. Mitchell Parker, Mitchell Parker

## 2012-05-24 NOTE — Progress Notes (Signed)
PT Cancellation Note  Evaluation cancelled today due to pt reports just getting back to bed from ambulating to bathroom and exhausted .  Requests PT to check back tomorrow.  Orbin Mayeux,KATHrine E 05/24/2012, 2:47 PM Pager: [phone]

## 2012-05-24 NOTE — Progress Notes (Signed)
Patient ID: Mitchell Dicenzo., male   DOB: 1951/01/22, 61 y.o.   MRN: [id number]  Subjective: No events overnight. Patient denies chest pain, shortness of breath, abdominal pain. Pt reports feeling better.  Objective:  Vital signs in last 24 hours:  Filed Vitals:   05/23/12 2218 05/24/12 0454 05/24/12 0943 05/24/12 1430  BP: 146/78 168/80 155/80 173/85  Pulse: 75 70 68 65  Temp: 98.7 F (37.1 C) 98.4 F (36.9 C) 97.9 F (36.6 C) 98.2 F (36.8 C)  TempSrc: Oral Oral Oral Oral  Resp: 19 18 18 18   Height:      Weight:      SpO2: 96% 95% 97% 97%    Intake/Output from previous day:   Intake/Output Summary (Last 24 hours) at 05/24/12 1725 Last data filed at 05/24/12 1429  Gross per 24 hour  Intake    600 ml  Output   1300 ml  Net   -700 ml    Physical Exam: General: Alert, awake, oriented x3, in no acute distress.Morbidly obese HEENT: No bruits, no goiter. Moist mucous membranes, no scleral icterus, no conjunctival pallor. Heart: Regular rate and rhythm, S1/S2 +, no murmurs, rubs, gallops. Lungs: Clear to auscultation bilaterally, decreased sounds at bases. No wheezing, no rhonchi, no rales.  Abdomen: Soft, nontender, nondistended, positive bowel sounds. Extremities: No clubbing or cyanosis, no pitting edema,  positive pedal pulses. Neuro: Grossly nonfocal.  Lab Results:  Lab 05/24/12 0420 05/23/12 0530 05/22/12 2127  WBC 9.0 11.9* 11.0*  HGB 13.1 13.4 13.8  HCT 38.7* 39.5 41.2  PLT 240 190 229    Lab 05/24/12 0420 05/23/12 0530 05/22/12 2127  NA 131* 131* 132*  K 3.7 3.8 2.8*  CL 91* 90* 89*  CO2 29 29 30   GLUCOSE 278* 237* 243*  BUN 20 16 14   CREATININE 1.15 1.31 1.33  CALCIUM 8.6 8.2* 8.5  MG -- 1.7 --    Lab 05/24/12 0420 05/23/12 0530  INR 3.44* 3.38*  PROTIME -- --   Cardiac markers:  Lab 05/22/12 2129  CKMB --  TROPONINI <0.30  MYOGLOBIN --   Studies/Results:  Dg Chest 2 View 05/22/2012    IMPRESSION:  Cardiac enlargement with linear  atelectasis or infiltration in the lung bases.  Shallow inspiration.  No significant interval change.    Medications: Scheduled Meds:   . antiseptic oral rinse  15 mL Mouth Rinse BID  . aspirin  81 mg Oral BID  . carvedilol  12.5 mg Oral BID WC  . cefTRIAXone (ROCEPHIN)  IV  1 g Intravenous Q24H  . colchicine  0.6 mg Oral BID  . furosemide  40 mg Intravenous Daily  . insulin aspart  0-20 Units Subcutaneous Q4H  . insulin NPH  90 Units Subcutaneous BID AC  . methylPREDNISolone (SOLU-MEDROL) injection  40 mg Intravenous Q12H  . metoCLOPramide  5 mg Oral BID   Continuous Infusions:  PRN Meds:.acetaminophen, acetaminophen, HYDROmorphone (DILAUDID) injection, Ipratropium-Albuterol, ondansetron (ZOFRAN) IV, ondansetron, sodium phosphate  Assessment/Plan:  Active Problems:  Gout, uncontrolled and recurrent  - mild clinical improvement  - will decrease the dose of solumedrol and plan to transition to PO  - will also initiate Colchicine BID and will follow up on daily BMP  - will consider Allopurinol upon doscharge   Hypertension, uncontrolled and with complications  - will reinitiate Lasix and will d/c IVF  - initiate Coreg and continue to monitor vitals per floor protocol  - readjust the regimen as indicated   Obesity,  morbid (more than 100 lbs over ideal weight or BMI > 40)  - nutrition consultation provided by myself   Chronic respiratory failure secondary to OSA, on CPAP  - will continue to monitor oxygen saturations as per floor protocol   Diabetes mellitus, uncontrolled and with complications GASTROPARESIS  - pt is currently NPO and will advance the diet to clear liquids  - will start insulin as per home medication regimen  - monitor CBG as per floor prtocol   Pulmonary embolism  - clinically stable  - will continue coumadin per pharmacy for dosing  - appreciate input   DVT (deep venous thrombosis)  - will also continue Coumadin   UTI (lower urinary tract infection),  not catheter related  - continue Rocephin day #2 - follow up on Urine culture   Hypokalemia  - supplemented  - within normal limits this AM   Vomiting  - secondary to gastroparesis  - will advance the diet to clear liquids  - continue supportive care with antiemetics prn   Chronic kidney disease stage II  - appears to be stable and at pt's baseline   Hyponatremia  - secondary to volume status  - stable and at pt's baseline   EDUCATION  - test results and diagnostic studies were discussed with patient - patient verbalized the understanding  - questions were answered at the bedside and contact information was provided for additional questions or concerns    LOS: 2 days   MAGICK-Shivansh Hardaway 05/24/2012, 5:25 PM  [facility] Pager: [phone]

## 2012-05-24 NOTE — Progress Notes (Signed)
ANTICOAGULATION CONSULT NOTE - Follow Up Consult  Pharmacy Consult for Warfarin Indication: Hx DVT/PE  Allergies  Allergen Reactions  . Ciprofloxacin Hives  . Levofloxacin (Levofloxacin) Hives    Patient Measurements: Height: 6\' 1"  (185.4 cm) Weight: 339 lb 11.7 oz (154.1 kg) IBW/kg (Calculated) : 79.9   Vital Signs: Temp: 97.9 F (36.6 C) (05/30 0943) Temp src: Oral (05/30 0943) BP: 155/80 mmHg (05/30 0943) Pulse Rate: 68  (05/30 0943)  Labs:  Basename 05/24/12 0420 05/23/12 0530 05/22/12 2129 05/22/12 2127  HGB 13.1 13.4 -- --  HCT 38.7* 39.5 -- 41.2  PLT 240 190 -- 229  APTT -- 68* -- --  LABPROT 35.2* 34.7* -- --  INR 3.44* 3.38* -- --  HEPARINUNFRC -- -- -- --  CREATININE 1.15 1.31 -- 1.33  CKTOTAL -- -- -- --  CKMB -- -- -- --  TROPONINI -- -- <0.30 --    Estimated Creatinine Clearance: 108.5 ml/min (by C-G formula based on Cr of 1.15).   Medications:  Scheduled:    . antiseptic oral rinse  15 mL Mouth Rinse BID  . aspirin  81 mg Oral BID  . cefTRIAXone (ROCEPHIN)  IV  1 g Intravenous Q24H  . colchicine  0.6 mg Oral BID  . furosemide  40 mg Intravenous Daily  . insulin aspart  0-20 Units Subcutaneous Q4H  . insulin NPH  90 Units Subcutaneous BID AC  . methylPREDNISolone (SOLU-MEDROL) injection  40 mg Intravenous Q12H  . metoCLOPramide  5 mg Oral BID  . DISCONTD: aspirin  81 mg Oral BID  . DISCONTD: insulin aspart  50 Units Subcutaneous TID AC  . DISCONTD: methylPREDNISolone (SOLU-MEDROL) injection  40 mg Intravenous Q6H    Assessment: 61 yo M admitted 5/29 with gout pain. On chronic warfarin for hx of DVT/PE. Home warfarin dose reported as 7.5mg  daily - last taken 5/27. INR remains supratherapeutic today. No bleeding reported in chart notes.  Goal of Therapy:  INR 2-3   Plan:  1) No warfarin tonight due to INR >3 2) Plan to resume warfarin dosing once INR <3.  Darrol Angel, PharmD Pager: [phone] 05/24/2012,1:55 PM

## 2012-05-24 NOTE — Progress Notes (Signed)
OT Cancellation Note  Treatment cancelled today due to patient's refusal to participate due to pain in feet. Will re attempt as pain under control with gout.  Alba Cory 05/24/2012, 10:12 AM

## 2012-05-25 DIAGNOSIS — R112 Nausea with vomiting, unspecified: Secondary | ICD-10-CM

## 2012-05-25 DIAGNOSIS — IMO0001 Reserved for inherently not codable concepts without codable children: Secondary | ICD-10-CM

## 2012-05-25 DIAGNOSIS — E1165 Type 2 diabetes mellitus with hyperglycemia: Secondary | ICD-10-CM

## 2012-05-25 DIAGNOSIS — N390 Urinary tract infection, site not specified: Secondary | ICD-10-CM

## 2012-05-25 DIAGNOSIS — M109 Gout, unspecified: Secondary | ICD-10-CM

## 2012-05-25 LAB — BASIC METABOLIC PANEL
BUN: 29 mg/dL — ABNORMAL HIGH (ref 6–23)
CO2: 26 mEq/L (ref 19–32)
Calcium: 8.2 mg/dL — ABNORMAL LOW (ref 8.4–10.5)
Chloride: 92 mEq/L — ABNORMAL LOW (ref 96–112)
Creatinine, Ser: 1.11 mg/dL (ref 0.50–1.35)
GFR calc Af Amer: 83 mL/min — ABNORMAL LOW (ref >90)
GFR calc non Af Amer: 71 mL/min — ABNORMAL LOW (ref >90)
Glucose, Bld: 250 mg/dL — ABNORMAL HIGH (ref 70–99)
Potassium: 3.3 mEq/L — ABNORMAL LOW (ref 3.5–5.1)
Sodium: 129 mEq/L — ABNORMAL LOW (ref 135–145)

## 2012-05-25 LAB — GLUCOSE, CAPILLARY
Glucose-Capillary: 174 mg/dL — ABNORMAL HIGH (ref 70–99)
Glucose-Capillary: 219 mg/dL — ABNORMAL HIGH (ref 70–99)
Glucose-Capillary: 237 mg/dL — ABNORMAL HIGH (ref 70–99)
Glucose-Capillary: 245 mg/dL — ABNORMAL HIGH (ref 70–99)
Glucose-Capillary: 257 mg/dL — ABNORMAL HIGH (ref 70–99)
Glucose-Capillary: 266 mg/dL — ABNORMAL HIGH (ref 70–99)
Glucose-Capillary: 268 mg/dL — ABNORMAL HIGH (ref 70–99)

## 2012-05-25 LAB — CBC
HCT: 38 % — ABNORMAL LOW (ref 39.0–52.0)
Hemoglobin: 13 g/dL (ref 13.0–17.0)
MCH: 27.1 pg (ref 26.0–34.0)
MCHC: 34.2 g/dL (ref 30.0–36.0)
MCV: 79.2 fL (ref 78.0–100.0)
Platelets: 271 10*3/uL (ref 150–400)
RBC: 4.8 MIL/uL (ref 4.22–5.81)
RDW: 13.9 % (ref 11.5–15.5)
WBC: 8.8 10*3/uL (ref 4.0–10.5)

## 2012-05-25 LAB — PROTIME-INR
INR: 3.96 — ABNORMAL HIGH (ref 0.00–1.49)
Prothrombin Time: 39.3 seconds — ABNORMAL HIGH (ref 11.6–15.2)

## 2012-05-25 LAB — URIC ACID: Uric Acid, Serum: 11.4 mg/dL — ABNORMAL HIGH (ref 4.0–7.8)

## 2012-05-25 MED ORDER — METOCLOPRAMIDE HCL 5 MG PO TABS
5.0000 mg | ORAL_TABLET | Freq: Four times a day (QID) | ORAL | Status: DC
  Administered 2012-05-25 – 2012-05-27 (×8): 5 mg via ORAL
  Filled 2012-05-25 (×12): qty 1

## 2012-05-25 MED ORDER — LOPERAMIDE HCL 2 MG PO CAPS
2.0000 mg | ORAL_CAPSULE | Freq: Two times a day (BID) | ORAL | Status: DC | PRN
Start: 2012-05-25 — End: 2012-05-27
  Administered 2012-05-25 (×2): 2 mg via ORAL
  Filled 2012-05-25 (×2): qty 1

## 2012-05-25 MED ORDER — POTASSIUM CHLORIDE CRYS ER 20 MEQ PO TBCR
20.0000 meq | EXTENDED_RELEASE_TABLET | Freq: Two times a day (BID) | ORAL | Status: DC
  Administered 2012-05-25 – 2012-05-26 (×3): 20 meq via ORAL
  Filled 2012-05-25 (×4): qty 1

## 2012-05-25 MED ORDER — FUROSEMIDE 80 MG PO TABS
80.0000 mg | ORAL_TABLET | Freq: Two times a day (BID) | ORAL | Status: DC
  Administered 2012-05-25 – 2012-05-27 (×4): 80 mg via ORAL
  Filled 2012-05-25 (×6): qty 1

## 2012-05-25 MED ORDER — INSULIN ASPART 100 UNIT/ML ~~LOC~~ SOLN
50.0000 [IU] | Freq: Three times a day (TID) | SUBCUTANEOUS | Status: DC
  Administered 2012-05-25: 50 [IU] via SUBCUTANEOUS

## 2012-05-25 NOTE — Progress Notes (Signed)
Inpatient Diabetes Program Recommendations  AACE/ADA: New Consensus Statement on Inpatient Glycemic Control (2009)  Target Ranges:  Prepandial:   less than 140 mg/dL      Peak postprandial:   less than 180 mg/dL (1-2 hours)      Critically ill patients:  140 - 180 mg/dL   Reason for Visit: Hyperglycemia  61 y.o. male. Multiple medical problems including morbid obesity, gout, rheumatoid arthritis, diabetes, hypertension, obstructive sleep apnea on CPAP, persistent recurrent vomiting episodes of unclear etiology, gastroparesis, status post cholecystectomy. He's lost about 40 pounds over the past few months.  [facility] manages diabetes and pt sees them monthly for insulin adjustments.  Checks blood sugars 3 times per day and they usuallly run 100 - 150 mg/dL.     Results for Mitchell Parker, Mitchell Parker (MRN [id number]) as of 05/25/2012 12:44  Ref. Range 05/24/2012 07:49 05/24/2012 12:08 05/24/2012 15:59 05/24/2012 19:58 05/25/2012 00:13 05/25/2012 04:11 05/25/2012 07:41 05/25/2012 11:35  Glucose-Capillary Latest Range: 70-99 mg/dL 409 (H) 811 (H) 914 (H) 334 (H) 268 (H) 237 (H) 219 (H) 245 (H)  Results for Mitchell Parker, Mitchell SR. (MRN [id number]) as of 05/25/2012 12:44  Ref. Range 05/22/2012 21:27  Hemoglobin A1C Latest Range: <5.7 % 7.3 (H)     Inpatient Diabetes Program Recommendations Correction (SSI): Change correction to resistant tidwc and hs instead of Q4. Insulin - Meal Coverage: Add Novolog 10 units tidwc and titrate if CBGs continue > 180 mg/dL  [id number]: 4.6% on 9/62/9528  Note: Please add Novolog 10 units tidwc for meal coverage insulin.  Will need daily titration.  Thank you.

## 2012-05-25 NOTE — Evaluation (Signed)
Physical Therapy Evaluation Patient Details Name: Mitchell SCHNECK Sr. MRN: [id number] DOB: Feb 11, 1951 Today's Date: 05/25/2012 Time: 7829-5621 PT Time Calculation (min): 13 min  PT Assessment / Plan / Recommendation Clinical Impression  61 yo male admitted with UTI, gout flare. Pt mobilizing very well. No physical assist needed. Pt reports ambulating to and from bathroom with supervision only from nursing. 1x eval. No PT follow-up during stay or at discharge. Will sign off.     PT Assessment  Patent does not need any further PT services    Follow Up Recommendations  No PT follow up    Barriers to Discharge        lEquipment Recommendations  None recommended by PT    Recommendations for Other Services     Frequency      Precautions / Restrictions Precautions Precautions: None Restrictions Weight Bearing Restrictions: No   Pertinent Vitals/Pain R ankle pain (gout)      Mobility  Bed Mobility Bed Mobility: Supine to Sit Supine to Sit: HOB elevated;6: Modified independent (Device/Increase time) Transfers Transfers: Sit to Stand;Stand to Sit Sit to Stand: 6: Modified independent (Device/Increase time);From bed Stand to Sit: 6: Modified independent (Device/Increase time);To chair/3-in-1 Ambulation/Gait Ambulation/Gait Assistance: 6: Modified independent (Device/Increase time) Ambulation Distance (Feet): 500 Feet Assistive device: Rolling walker Ambulation/Gait Assistance Details: Good gait speed.  Gait Pattern: Step-through pattern    Exercises     PT Diagnosis:    PT Problem List:   PT Treatment Interventions:     PT Goals    Visit Information  Last PT Received On: 05/25/12 Assistance Needed: +1    Subjective Data  Subjective: "I'm ready to go today" Patient Stated Goal: Home   Prior Functioning  Home Living Lives With: Spouse Available Help at Discharge: Family Type of Home: House Home Access: Stairs to enter Secretary/administrator of Steps:  3 Entrance Stairs-Rails: Right Home Layout: One level Home Adaptive Equipment: Walker - rolling Prior Function Level of Independence: Independent with assistive device(s) Able to Take Stairs?: Yes Communication Communication: No difficulties    Cognition  Overall Cognitive Status: Appears within functional limits for tasks assessed/performed Arousal/Alertness: Awake/alert Orientation Level: Appears intact for tasks assessed Behavior During Session: [facility] for tasks performed    Extremity/Trunk Assessment Right Lower Extremity Assessment RLE ROM/Strength/Tone: WFL for tasks assessed RLE Coordination: WFL - gross motor Left Lower Extremity Assessment LLE ROM/Strength/Tone: WFL for tasks assessed LLE Coordination: WFL - gross motor Trunk Assessment Trunk Assessment: Normal   Balance    End of Session PT - End of Session Activity Tolerance: Patient tolerated treatment well Patient left: in chair;with call bell/phone within reach   Rebeca Alert [facility] 05/25/2012, 10:33 AM [phone]

## 2012-05-25 NOTE — Progress Notes (Signed)
ANTICOAGULATION CONSULT NOTE - Follow Up Consult  Pharmacy Consult for Warfarin Indication: Hx DVT/PE  Allergies  Allergen Reactions  . Ciprofloxacin Hives  . Levofloxacin (Levofloxacin) Hives    Patient Measurements: Height: 6\' 1"  (185.4 cm) Weight: 337 lb 15.4 oz (153.3 kg) IBW/kg (Calculated) : 79.9   Vital Signs: Temp: 97.8 F (36.6 C) (05/31 1037) Temp src: Oral (05/31 1037) BP: 152/81 mmHg (05/31 1037) Pulse Rate: 73  (05/31 1037)  Labs:  Basename 05/25/12 0413 05/24/12 0420 05/23/12 0530 05/22/12 2129  HGB 13.0 13.1 -- --  HCT 38.0* 38.7* 39.5 --  PLT 271 240 190 --  APTT -- -- 68* --  LABPROT 39.3* 35.2* 34.7* --  INR 3.96* 3.44* 3.38* --  HEPARINUNFRC -- -- -- --  CREATININE 1.11 1.15 1.31 --  CKTOTAL -- -- -- --  CKMB -- -- -- --  TROPONINI -- -- -- <0.30    Estimated Creatinine Clearance: 112.1 ml/min (by C-G formula based on Cr of 1.11).   Medications:  Scheduled:     . antiseptic oral rinse  15 mL Mouth Rinse BID  . aspirin  81 mg Oral BID  . carvedilol  12.5 mg Oral BID WC  . cefTRIAXone (ROCEPHIN)  IV  1 g Intravenous Q24H  . colchicine  0.6 mg Oral BID  . furosemide  40 mg Intravenous Daily  . insulin aspart  0-20 Units Subcutaneous Q4H  . insulin NPH  90 Units Subcutaneous BID AC  . metoCLOPramide  5 mg Oral BID  . predniSONE  50 mg Oral Q breakfast  . DISCONTD: methylPREDNISolone (SOLU-MEDROL) injection  40 mg Intravenous Q12H    Assessment: 61 yo M admitted 5/29 with gout pain. On chronic warfarin for hx of DVT/PE. Home warfarin dose reported as 7.5mg  daily - last taken 5/27. INR remains supratherapeutic today and continues to rise, despite being given no warfarin in hospital. No bleeding reported in chart notes.  Goal of Therapy:  INR 2-3   Plan:  1) No warfarin tonight due to INR >3 2) Plan to resume warfarin dosing once INR <3.  Darrol Angel, PharmD Pager: [phone] 05/25/2012,1:07 PM

## 2012-05-25 NOTE — Progress Notes (Signed)
OT Note:  Pt screened for OT.  Noted he was mod I with PT.  He has a walk in shower and feels he will be fine as he was hospitalized several months ago with same problems.  [place], [place] [phone] 05/25/2012

## 2012-05-25 NOTE — Progress Notes (Signed)
Patient ID: Mitchell Claud., male   DOB: Nov 17, 1951, 61 y.o.   MRN: [id number]  Subjective: No events overnight. Patient denies chest pain, shortness of breath, abdominal pain. Had bowel movement and reports ambulating.  Objective:  Vital signs in last 24 hours:  Filed Vitals:   05/25/12 0137 05/25/12 0418 05/25/12 1037 05/25/12 1314  BP: 163/75 173/81 152/81 167/76  Pulse: 64 68 73 68  Temp: 98.2 F (36.8 C) 98.2 F (36.8 C) 97.8 F (36.6 C) 98 F (36.7 C)  TempSrc: Oral Oral Oral Oral  Resp: 20 20 18 18   Height:      Weight:  153.3 kg (337 lb 15.4 oz)    SpO2: 97% 95% 98% 98%    Intake/Output from previous day:   Intake/Output Summary (Last 24 hours) at 05/25/12 1355 Last data filed at 05/25/12 1300  Gross per 24 hour  Intake    888 ml  Output   1090 ml  Net   -202 ml    Physical Exam: General: Alert, awake, oriented x3, in no acute distress. HEENT: No bruits, no goiter. Moist mucous membranes, no scleral icterus, no conjunctival pallor. Heart: Regular rate and rhythm, S1/S2 +, no murmurs, rubs, gallops. Lungs: Clear to auscultation bilaterally but decreased breath sounds at bases. No wheezing, no rhonchi, no rales.  Abdomen: Soft, nontender, nondistended, positive bowel sounds. Extremities: No clubbing or cyanosis, bilateral lower extremity pitting edema with chronic venous stasis changes,  positive pedal pulses. Neuro: Grossly nonfocal.  Lab Results:  Lab 05/25/12 0413 05/24/12 0420 05/23/12 0530 05/22/12 2127  WBC 8.8 9.0 11.9* 11.0*  HGB 13.0 13.1 13.4 13.8  HCT 38.0* 38.7* 39.5 41.2  PLT 271 240 190 229    Lab 05/25/12 0413 05/24/12 0420 05/23/12 0530 05/22/12 2127  NA 129* 131* 131* 132*  K 3.3* 3.7 3.8 2.8*  CL 92* 91* 90* 89*  CO2 26 29 29 30   GLUCOSE 250* 278* 237* 243*  BUN 29* 20 16 14   CREATININE 1.11 1.15 1.31 1.33  CALCIUM 8.2* 8.6 8.2* 8.5  MG -- -- 1.7 --    Lab 05/25/12 0413 05/24/12 0420 05/23/12 0530  INR 3.96* 3.44* 3.38*    PROTIME -- -- --   Recent Results (from the past 240 hour(s))  URINE CULTURE     Status: Normal   Collection Time   05/22/12  9:33 PM      Component Value Range Status Comment   Specimen Description URINE, RANDOM   Final    Special Requests NONE   Final    Culture  Setup Time [id number]   Final    Colony Count NO GROWTH   Final    Culture NO GROWTH   Final    Report Status 05/24/2012 FINAL   Final   CULTURE, BLOOD (ROUTINE X 2)     Status: Normal (Preliminary result)   Collection Time   05/23/12  1:19 AM      Component Value Range Status Comment   Specimen Description BLOOD L HAND   Final    Special Requests Normal BOTTLES DRAWN AEROBIC ONLY 2CC   Final    Culture  Setup Time [id number]   Final    Culture     Final    Value:        BLOOD CULTURE RECEIVED NO GROWTH TO DATE CULTURE WILL BE HELD FOR 5 DAYS BEFORE ISSUING A FINAL NEGATIVE REPORT   Report Status PENDING   Incomplete   CULTURE, BLOOD (  ROUTINE X 2)     Status: Normal (Preliminary result)   Collection Time   05/23/12  1:20 AM      Component Value Range Status Comment   Specimen Description BLOOD L FOREARM   Final    Special Requests Normal BOTTLES DRAWN AEROBIC AND ANAEROBIC 3CC   Final    Culture  Setup Time [id number]   Final    Culture     Final    Value:        BLOOD CULTURE RECEIVED NO GROWTH TO DATE CULTURE WILL BE HELD FOR 5 DAYS BEFORE ISSUING A FINAL NEGATIVE REPORT   Report Status PENDING   Incomplete     Studies/Results: No results found.  Medications: Scheduled Meds:   . antiseptic oral rinse  15 mL Mouth Rinse BID  . aspirin  81 mg Oral BID  . carvedilol  12.5 mg Oral BID WC  . cefTRIAXone (ROCEPHIN)  IV  1 g Intravenous Q24H  . colchicine  0.6 mg Oral BID  . furosemide  40 mg Intravenous Daily  . insulin aspart  0-20 Units Subcutaneous Q4H  . insulin NPH  90 Units Subcutaneous BID AC  . metoCLOPramide  5 mg Oral BID  . predniSONE  50 mg Oral Q breakfast  . DISCONTD: methylPREDNISolone  (SOLU-MEDROL) injection  40 mg Intravenous Q12H   Continuous Infusions:  PRN Meds:.acetaminophen, acetaminophen, HYDROmorphone (DILAUDID) injection, Ipratropium-Albuterol, ondansetron (ZOFRAN) IV, ondansetron  Assessment/Plan:  Active Problems:  Gout, uncontrolled and recurrent  - mild clinical improvement  - will decrease the dose of solumedrol and plan to transition to PO  - will also initiate Colchicine BID and will follow up on daily BMP  - will consider Allopurinol upon doscharge   Hypertension, uncontrolled and with complications  - will reinitiate Lasix and will d/c IVF  - initiate Coreg and continue to monitor vitals per floor protocol  - readjust the regimen as indicated   Obesity, morbid (more than 100 lbs over ideal weight or BMI > 40)  - nutrition consultation provided by myself   Chronic respiratory failure secondary to OSA, on CPAP  - will continue to monitor oxygen saturations as per floor protocol   Diabetes mellitus, uncontrolled and with complications GASTROPARESIS  - pt is currently NPO and will advance the diet to clear liquids  - will start insulin as per home medication regimen  - monitor CBG as per floor prtocol   Pulmonary embolism  - clinically stable  - will continue coumadin per pharmacy for dosing  - appreciate input   DVT (deep venous thrombosis)  - will also continue Coumadin   UTI (lower urinary tract infection), not catheter related  - continue Rocephin day #3 - follow up on Urine culture   Hypokalemia  - supplement as indicated - within normal limits this AM   Vomiting  - secondary to gastroparesis  - will advance the diet to regular  - continue supportive care with antiemetics prn   Chronic kidney disease stage II  - appears to be stable and at pt's baseline   Hyponatremia  - secondary to volume status  - stable and at pt's baseline   EDUCATION  - test results and diagnostic studies were discussed with patient  - patient  verbalized the understanding  - questions were answered at the bedside and contact information was provided for additional questions or concerns    LOS: 3 days   MAGICK-Ambrie Carte 05/25/2012, 1:55 PM  [facility] Pager: [phone]

## 2012-05-26 DIAGNOSIS — IMO0001 Reserved for inherently not codable concepts without codable children: Secondary | ICD-10-CM

## 2012-05-26 DIAGNOSIS — E1165 Type 2 diabetes mellitus with hyperglycemia: Secondary | ICD-10-CM

## 2012-05-26 DIAGNOSIS — R112 Nausea with vomiting, unspecified: Secondary | ICD-10-CM

## 2012-05-26 DIAGNOSIS — M109 Gout, unspecified: Secondary | ICD-10-CM

## 2012-05-26 DIAGNOSIS — N390 Urinary tract infection, site not specified: Secondary | ICD-10-CM

## 2012-05-26 LAB — PROTIME-INR
INR: 2.9 — ABNORMAL HIGH (ref 0.00–1.49)
Prothrombin Time: 30.8 seconds — ABNORMAL HIGH (ref 11.6–15.2)

## 2012-05-26 LAB — GLUCOSE, CAPILLARY
Glucose-Capillary: 140 mg/dL — ABNORMAL HIGH (ref 70–99)
Glucose-Capillary: 230 mg/dL — ABNORMAL HIGH (ref 70–99)
Glucose-Capillary: 231 mg/dL — ABNORMAL HIGH (ref 70–99)
Glucose-Capillary: 73 mg/dL (ref 70–99)
Glucose-Capillary: 85 mg/dL (ref 70–99)
Glucose-Capillary: 96 mg/dL (ref 70–99)

## 2012-05-26 LAB — CBC
HCT: 40.5 % (ref 39.0–52.0)
Hemoglobin: 13.9 g/dL (ref 13.0–17.0)
MCH: 27 pg (ref 26.0–34.0)
MCHC: 34.3 g/dL (ref 30.0–36.0)
MCV: 78.8 fL (ref 78.0–100.0)
Platelets: 323 10*3/uL (ref 150–400)
RBC: 5.14 MIL/uL (ref 4.22–5.81)
RDW: 14 % (ref 11.5–15.5)
WBC: 7.9 10*3/uL (ref 4.0–10.5)

## 2012-05-26 LAB — BASIC METABOLIC PANEL
BUN: 30 mg/dL — ABNORMAL HIGH (ref 6–23)
CO2: 29 mEq/L (ref 19–32)
Calcium: 8.5 mg/dL (ref 8.4–10.5)
Chloride: 95 mEq/L — ABNORMAL LOW (ref 96–112)
Creatinine, Ser: 1.13 mg/dL (ref 0.50–1.35)
GFR calc Af Amer: 81 mL/min — ABNORMAL LOW (ref >90)
GFR calc non Af Amer: 70 mL/min — ABNORMAL LOW (ref >90)
Glucose, Bld: 77 mg/dL (ref 70–99)
Potassium: 3 mEq/L — ABNORMAL LOW (ref 3.5–5.1)
Sodium: 135 mEq/L (ref 135–145)

## 2012-05-26 MED ORDER — METHYLPREDNISOLONE SODIUM SUCC 40 MG IJ SOLR
40.0000 mg | Freq: Two times a day (BID) | INTRAMUSCULAR | Status: DC
  Administered 2012-05-26: 40 mg via INTRAVENOUS
  Filled 2012-05-26 (×3): qty 1

## 2012-05-26 MED ORDER — OXYCODONE-ACETAMINOPHEN 5-325 MG PO TABS: 1.0000 | ORAL_TABLET | ORAL | Status: DC | PRN

## 2012-05-26 MED ORDER — POTASSIUM CHLORIDE CRYS ER 20 MEQ PO TBCR
40.0000 meq | EXTENDED_RELEASE_TABLET | Freq: Two times a day (BID) | ORAL | Status: DC
  Filled 2012-05-26: qty 2

## 2012-05-26 MED ORDER — METHYLPREDNISOLONE SODIUM SUCC 125 MG IJ SOLR
125.0000 mg | INTRAMUSCULAR | Status: AC
  Administered 2012-05-26: 125 mg via INTRAVENOUS
  Filled 2012-05-26: qty 2

## 2012-05-26 MED ORDER — BENZOCAINE 10 % MT GEL
Freq: Four times a day (QID) | OROMUCOSAL | Status: DC | PRN
  Filled 2012-05-26: qty 9.4

## 2012-05-26 MED ORDER — CARVEDILOL 25 MG PO TABS
25.0000 mg | ORAL_TABLET | Freq: Two times a day (BID) | ORAL | Status: DC
  Administered 2012-05-26 – 2012-05-27 (×2): 25 mg via ORAL
  Filled 2012-05-26 (×4): qty 1

## 2012-05-26 MED ORDER — WARFARIN SODIUM 2.5 MG PO TABS
2.5000 mg | ORAL_TABLET | Freq: Once | ORAL | Status: AC
  Administered 2012-05-26: 2.5 mg via ORAL
  Filled 2012-05-26: qty 1

## 2012-05-26 MED ORDER — OXYCODONE HCL 5 MG PO TABS
2.5000 mg | ORAL_TABLET | ORAL | Status: DC | PRN
  Administered 2012-05-26: 2.5 mg via ORAL
  Filled 2012-05-26: qty 1

## 2012-05-26 MED ORDER — OXYCODONE-ACETAMINOPHEN 5-325 MG PO TABS
1.0000 | ORAL_TABLET | ORAL | Status: DC | PRN
  Administered 2012-05-26: 1 via ORAL
  Filled 2012-05-26: qty 1

## 2012-05-26 MED ORDER — WARFARIN - PHARMACIST DOSING INPATIENT: Freq: Every day | Status: DC

## 2012-05-26 MED ORDER — POTASSIUM CHLORIDE CRYS ER 20 MEQ PO TBCR
40.0000 meq | EXTENDED_RELEASE_TABLET | Freq: Two times a day (BID) | ORAL | Status: DC
  Administered 2012-05-26 – 2012-05-27 (×3): 40 meq via ORAL
  Filled 2012-05-26 (×4): qty 2

## 2012-05-26 MED ORDER — OXYCODONE-ACETAMINOPHEN 5-325 MG PO TABS: 1.5000 | ORAL_TABLET | ORAL | Status: DC | PRN

## 2012-05-26 MED ORDER — HYDRALAZINE HCL 20 MG/ML IJ SOLN
10.0000 mg | Freq: Once | INTRAMUSCULAR | Status: AC
  Administered 2012-05-26: 10 mg via INTRAVENOUS
  Filled 2012-05-26: qty 1

## 2012-05-26 NOTE — Progress Notes (Signed)
Patient ID: Mitchell Parker., male   DOB: Apr 06, 1951, 61 y.o.   MRN: [id number]  Subjective: No events overnight. Patient denies chest pain, shortness of breath, abdominal pain. Had bowel movement and reports ambulating.  Objective:  Vital signs in last 24 hours:  Filed Vitals:   05/26/12 0223 05/26/12 0512 05/26/12 0934 05/26/12 1350  BP: 174/90 179/91 166/80 151/83  Pulse: 57 55 69 65  Temp: 97.8 F (36.6 C) 98.2 F (36.8 C)  98.2 F (36.8 C)  TempSrc: Oral Oral  Oral  Resp: 20 18 20 18   Height:      Weight:  154.722 kg (341 lb 1.6 oz)    SpO2: 99% 98%  97%    Intake/Output from previous day:   Intake/Output Summary (Last 24 hours) at 05/26/12 1427 Last data filed at 05/26/12 1300  Gross per 24 hour  Intake    650 ml  Output   1250 ml  Net   -600 ml    Physical Exam: General: Alert, awake, oriented x3, in no acute distress. Morbidly obesse HEENT: No bruits, no goiter. Moist mucous membranes, no scleral icterus, no conjunctival pallor. Heart: Regular rate and rhythm, S1/S2 +, no murmurs, rubs, gallops. Lungs: Clear to auscultation bilaterally but decreased breath sounds at bases. No wheezing, no rhonchi, no rales.  Abdomen: Soft, nontender, nondistended, positive bowel sounds. Extremities: No clubbing or cyanosis, bilateral +1 pitting edema,  positive pedal pulses. Chronic venous stasis changes Neuro: Grossly nonfocal.  Lab Results:  Lab 05/26/12 0725 05/25/12 0413 05/24/12 0420 05/23/12 0530 05/22/12 2127  WBC 7.9 8.8 9.0 11.9* 11.0*  HGB 13.9 13.0 13.1 13.4 13.8  HCT 40.5 38.0* 38.7* 39.5 41.2  PLT 323 271 240 190 229    Lab 05/26/12 0725 05/25/12 0413 05/24/12 0420 05/23/12 0530 05/22/12 2127  NA 135 129* 131* 131* 132*  K 3.0* 3.3* 3.7 3.8 2.8*  CL 95* 92* 91* 90* 89*  CO2 29 26 29 29 30   GLUCOSE 77 250* 278* 237* 243*  BUN 30* 29* 20 16 14   CREATININE 1.13 1.11 1.15 1.31 1.33  CALCIUM 8.5 8.2* 8.6 8.2* 8.5    Lab 05/26/12 0725 05/25/12 0413  05/24/12 0420 05/23/12 0530  INR 2.90* 3.96* 3.44* 3.38*  PROTIME -- -- -- --   Studies/results: No results found.  Medications: Scheduled Meds:   . antiseptic oral rinse  15 mL Mouth Rinse BID  . aspirin  81 mg Oral BID  . carvedilol  12.5 mg Oral BID WC  . cefTRIAXone (ROCEPHIN)  IV  1 g Intravenous Q24H  . colchicine  0.6 mg Oral BID  . furosemide  80 mg Oral BID  . hydrALAZINE  10 mg Intravenous Once  . insulin aspart  0-20 Units Subcutaneous Q4H  . insulin NPH  90 Units Subcutaneous BID AC  . methylPREDNISolone (SOLU-MEDROL) injection  125 mg Intravenous NOW  . metoCLOPramide  5 mg Oral Q6H  . potassium chloride  20 mEq Oral BID  . predniSONE  50 mg Oral Q breakfast  . warfarin  2.5 mg Oral ONCE-1800  . Warfarin - Pharmacist Dosing Inpatient   Does not apply q1800  . DISCONTD: insulin aspart  50 Units Subcutaneous TID AC   Continuous Infusions:  PRN Meds:.acetaminophen, acetaminophen, HYDROmorphone (DILAUDID) injection, Ipratropium-Albuterol, loperamide, ondansetron (ZOFRAN) IV, ondansetron  Assessment/Plan:  Active Problems:  Gout, uncontrolled and recurrent  - mild clinical improvement  - will continue Solumedrol for now and plan to transition to PO - will  also continue Colchicine BID and will follow up on daily BMP  - will consider Allopurinol upon doscharge   Hypertension, uncontrolled and with complications  - will reinitiate Lasix and will d/c IVF  - initiate Coreg and continue to monitor vitals per floor protocol  - readjust the regimen as indicated   Obesity, morbid (more than 100 lbs over ideal weight or BMI > 40)  - nutrition consultation provided by myself   Chronic respiratory failure secondary to OSA, on CPAP  - will continue to monitor oxygen saturations as per floor protocol   Diabetes mellitus, uncontrolled and with complications GASTROPARESIS  - pt is currently NPO and will advance the diet to clear liquids  - will start insulin as per home  medication regimen  - monitor CBG as per floor protocol   Pulmonary embolism  - clinically stable  - will continue coumadin per pharmacy for dosing  - appreciate input   DVT (deep venous thrombosis)  - will also continue Coumadin   UTI (lower urinary tract infection), not catheter related  - continue Rocephin day #4  - Urine culture no growth - will d/c antibiotic  Hypokalemia  - supplement as indicated  - BMP in AM  Vomiting  - secondary to gastroparesis  - will advance the diet to regular  - continue supportive care with antiemetics prn   Chronic kidney disease stage II  - appears to be stable and at pt's baseline   Hyponatremia  - secondary to volume status  - stable and at pt's baseline  - within normal limits this AM  EDUCATION  - test results and diagnostic studies were discussed with patient  - patient verbalized the understanding  - questions were answered at the bedside and contact information was provided for additional questions or concerns     LOS: 4 days   MAGICK-Sherley Mckenney 05/26/2012, 2:27 PM  [facility] Pager: [phone]

## 2012-05-26 NOTE — Progress Notes (Signed)
Patient complaining of a sore in the corner of his upper lip on the right side.

## 2012-05-26 NOTE — Progress Notes (Signed)
ANTICOAGULATION CONSULT NOTE - Follow Up Consult  Pharmacy Consult for Warfarin Indication: Hx DVT/PE  Allergies  Allergen Reactions  . Ciprofloxacin Hives  . Levofloxacin (Levofloxacin) Hives    Patient Measurements: Height: 6\' 1"  (185.4 cm) Weight: 341 lb 1.6 oz (154.722 kg) IBW/kg (Calculated) : 79.9   Vital Signs: Temp: 98.2 F (36.8 C) (06/01 0512) Temp src: Oral (06/01 0512) BP: 179/91 mmHg (06/01 0512) Pulse Rate: 55  (06/01 0512)  Labs:  Basename 05/26/12 0725 05/25/12 0413 05/24/12 0420  HGB 13.9 13.0 --  HCT 40.5 38.0* 38.7*  PLT 323 271 240  APTT -- -- --  LABPROT 30.8* 39.3* 35.2*  INR 2.90* 3.96* 3.44*  HEPARINUNFRC -- -- --  CREATININE 1.13 1.11 1.15  CKTOTAL -- -- --  CKMB -- -- --  TROPONINI -- -- --    Estimated Creatinine Clearance: 110.7 ml/min (by C-G formula based on Cr of 1.13).   Medications:  Scheduled:     . antiseptic oral rinse  15 mL Mouth Rinse BID  . aspirin  81 mg Oral BID  . carvedilol  12.5 mg Oral BID WC  . cefTRIAXone (ROCEPHIN)  IV  1 g Intravenous Q24H  . colchicine  0.6 mg Oral BID  . furosemide  80 mg Oral BID  . hydrALAZINE  10 mg Intravenous Once  . insulin aspart  0-20 Units Subcutaneous Q4H  . insulin aspart  50 Units Subcutaneous TID AC  . insulin NPH  90 Units Subcutaneous BID AC  . metoCLOPramide  5 mg Oral Q6H  . potassium chloride  20 mEq Oral BID  . predniSONE  50 mg Oral Q breakfast  . warfarin  2.5 mg Oral ONCE-1800  . Warfarin - Pharmacist Dosing Inpatient   Does not apply q1800  . DISCONTD: furosemide  40 mg Intravenous Daily  . DISCONTD: metoCLOPramide  5 mg Oral BID    Assessment: 61 yo M admitted 5/29 with gout pain. On chronic warfarin for hx of DVT/PE. Home warfarin dose reported as 7.5mg  daily - last taken 5/27.  INR reduced today, regular diet, no emesis noted.  Resume Warfarin today.  Goal of Therapy:  INR 2-3   Plan:  1) Warfarin 2.5mg  today 2) continue daily protime  Otho Bellows , PharmD Pager: [phone] 05/26/2012,8:50 AM

## 2012-05-27 LAB — GLUCOSE, CAPILLARY
Glucose-Capillary: 165 mg/dL — ABNORMAL HIGH (ref 70–99)
Glucose-Capillary: 153 mg/dL — ABNORMAL HIGH (ref 70–99)

## 2012-05-27 LAB — CBC
HCT: 42.1 % (ref 39.0–52.0)
Hemoglobin: 14.4 g/dL (ref 13.0–17.0)
MCH: 27.2 pg (ref 26.0–34.0)
MCHC: 34.2 g/dL (ref 30.0–36.0)
MCV: 79.4 fL (ref 78.0–100.0)
Platelets: 279 10*3/uL (ref 150–400)
RBC: 5.3 MIL/uL (ref 4.22–5.81)
RDW: 14.1 % (ref 11.5–15.5)
WBC: 8.3 10*3/uL (ref 4.0–10.5)

## 2012-05-27 LAB — BASIC METABOLIC PANEL
BUN: 27 mg/dL — ABNORMAL HIGH (ref 6–23)
CO2: 24 mEq/L (ref 19–32)
Calcium: 8.3 mg/dL — ABNORMAL LOW (ref 8.4–10.5)
Chloride: 99 mEq/L (ref 96–112)
Creatinine, Ser: 1.02 mg/dL (ref 0.50–1.35)
GFR calc Af Amer: >90 mL/min (ref >90)
GFR calc non Af Amer: 79 mL/min — ABNORMAL LOW (ref >90)
Glucose, Bld: 136 mg/dL — ABNORMAL HIGH (ref 70–99)
Potassium: 3.6 mEq/L (ref 3.5–5.1)
Sodium: 135 mEq/L (ref 135–145)

## 2012-05-27 LAB — PROTIME-INR
INR: 1.92 — ABNORMAL HIGH (ref 0.00–1.49)
Prothrombin Time: 22.3 seconds — ABNORMAL HIGH (ref 11.6–15.2)

## 2012-05-27 MED ORDER — METOCLOPRAMIDE HCL 5 MG PO TABS: 5.0000 mg | ORAL_TABLET | Freq: Four times a day (QID) | ORAL | Status: DC

## 2012-05-27 MED ORDER — HYDRALAZINE HCL 20 MG/ML IJ SOLN
10.0000 mg | Freq: Once | INTRAMUSCULAR | Status: AC
  Administered 2012-05-27: 10 mg via INTRAVENOUS
  Filled 2012-05-27: qty 1

## 2012-05-27 MED ORDER — OXYCODONE-ACETAMINOPHEN 7.5-500 MG PO TABS: 1.0000 | ORAL_TABLET | Freq: Four times a day (QID) | ORAL | Status: DC | PRN

## 2012-05-27 MED ORDER — PREDNISONE 50 MG PO TABS: ORAL_TABLET | ORAL | Status: AC

## 2012-05-27 MED ORDER — WARFARIN SODIUM 5 MG PO TABS
5.0000 mg | ORAL_TABLET | Freq: Once | ORAL | Status: DC
  Filled 2012-05-27: qty 1

## 2012-05-27 NOTE — Discharge Instructions (Signed)
Gout Gout is an inflammatory condition (arthritis) caused by a buildup of uric acid crystals in the joints. Uric acid is a chemical that is normally present in the blood. Under some circumstances, uric acid can form into crystals in your joints. This causes joint redness, soreness, and swelling (inflammation). Repeat attacks are common. Over time, uric acid crystals can form into masses (tophi) near a joint, causing disfigurement. Gout is treatable and often preventable. CAUSES  The disease begins with elevated levels of uric acid in the blood. Uric acid is produced by your body when it breaks down a naturally found substance called purines. This also happens when you eat certain foods such as meats and fish. Causes of an elevated uric acid level include:  Being passed down from parent to child (heredity).   Diseases that cause increased uric acid production (obesity, psoriasis, some cancers).   Excessive alcohol use.   Diet, especially diets rich in meat and seafood.   Medicines, including certain cancer-fighting drugs (chemotherapy), diuretics, and aspirin.   Chronic kidney disease. The kidneys are no longer able to remove uric acid well.   Problems with metabolism.  Conditions strongly associated with gout include:  Obesity.   High blood pressure.   High cholesterol.   Diabetes.  Not everyone with elevated uric acid levels gets gout. It is not understood why some people get gout and others do not. Surgery, joint injury, and eating too much of certain foods are some of the factors that can lead to gout. SYMPTOMS   An attack of gout comes on quickly. It causes intense pain with redness, swelling, and warmth in a joint.   Fever can occur.   Often, only one joint is involved. Certain joints are more commonly involved:   Base of the big toe.   Knee.   Ankle.   Wrist.   Finger.  Without treatment, an attack usually goes away in a few days to weeks. Between attacks, you  usually will not have symptoms, which is different from many other forms of arthritis. DIAGNOSIS  Your caregiver will suspect gout based on your symptoms and exam. Removal of fluid from the joint (arthrocentesis) is done to check for uric acid crystals. Your caregiver will give you a medicine that numbs the area (local anesthetic) and use a needle to remove joint fluid for exam. Gout is confirmed when uric acid crystals are seen in joint fluid, using a special microscope. Sometimes, blood, urine, and X-ray tests are also used. TREATMENT  There are 2 phases to gout treatment: treating the sudden onset (acute) attack and preventing attacks (prophylaxis). Treatment of an Acute Attack  Medicines are used. These include anti-inflammatory medicines or steroid medicines.   An injection of steroid medicine into the affected joint is sometimes necessary.   The painful joint is rested. Movement can worsen the arthritis.   You may use warm or cold treatments on painful joints, depending which works best for you.   Discuss the use of coffee, vitamin C, or cherries with your caregiver. These may be helpful treatment options.  Treatment to Prevent Attacks After the acute attack subsides, your caregiver may advise prophylactic medicine. These medicines either help your kidneys eliminate uric acid from your body or decrease your uric acid production. You may need to stay on these medicines for a very long time. The early phase of treatment with prophylactic medicine can be associated with an increase in acute gout attacks. For this reason, during the first few months   of treatment, your caregiver may also advise you to take medicines usually used for acute gout treatment. Be sure you understand your caregiver's directions. You should also discuss dietary treatment with your caregiver. Certain foods such as meats and fish can increase uric acid levels. Other foods such as dairy can decrease levels. Your caregiver  can give you a list of foods to avoid. HOME CARE INSTRUCTIONS   Do not take aspirin to relieve pain. This raises uric acid levels.   Only take over-the-counter or prescription medicines for pain, discomfort, or fever as directed by your caregiver.   Rest the joint as much as possible. When in bed, keep sheets and blankets off painful areas.   Keep the affected joint raised (elevated).   Use crutches if the painful joint is in your leg.   Drink enough water and fluids to keep your urine clear or pale yellow. This helps your body get rid of uric acid. Do not drink alcoholic beverages. They slow the passage of uric acid.   Follow your caregiver's dietary instructions. Pay careful attention to the amount of protein you eat. Your daily diet should emphasize fruits, vegetables, whole grains, and fat-free or low-fat milk products.   Maintain a healthy body weight.  SEEK MEDICAL CARE IF:   You have an oral temperature above 102 F (38.9 C).   You develop diarrhea, vomiting, or any side effects from medicines.   You do not feel better in 24 hours, or you are getting worse.  SEEK IMMEDIATE MEDICAL CARE IF:   Your joint becomes suddenly more tender and you have:   Chills.   An oral temperature above 102 F (38.9 C), not controlled by medicine.  MAKE SURE YOU:   Understand these instructions.   Will watch your condition.   Will get help right away if you are not doing well or get worse.  Document Released: 12/09/2000 Document Revised: 12/01/2011 Document Reviewed: 03/22/2010 [employer] Patient Information 2012 [employer]. 

## 2012-05-27 NOTE — Progress Notes (Addendum)
Patient is eating breakfast in bed.  Denies pain.  BP is elevated this am.  Night nurse gave patient Hydralazine.

## 2012-05-27 NOTE — Progress Notes (Signed)
ANTICOAGULATION CONSULT NOTE - Follow Up Consult  Pharmacy Consult for Warfarin Indication: Hx DVT/PE  Allergies  Allergen Reactions  . Ciprofloxacin Hives  . Levofloxacin (Levofloxacin) Hives   Patient Measurements: Height: 6\' 1"  (185.4 cm) Weight: 342 lb 4.8 oz (155.266 kg) IBW/kg (Calculated) : 79.9   Vital Signs: Temp: 98.2 F (36.8 C) (06/02 0510) Temp src: Oral (06/02 0510) BP: 169/81 mmHg (06/02 0631) Pulse Rate: 59  (06/02 0510)  Labs:  Alvira Philips 05/27/12 0848 05/27/12 0538 05/26/12 0725 05/25/12 0413  HGB -- 14.4 13.9 --  HCT -- 42.1 40.5 38.0*  PLT -- 279 323 271  APTT -- -- -- --  LABPROT 22.3* -- 30.8* 39.3*  INR 1.92* -- 2.90* 3.96*  HEPARINUNFRC -- -- -- --  CREATININE -- 1.02 1.13 1.11  CKTOTAL -- -- -- --  CKMB -- -- -- --  TROPONINI -- -- -- --    Estimated Creatinine Clearance: 122.9 ml/min (by C-G formula based on Cr of 1.02).  Medications:  Scheduled:     . antiseptic oral rinse  15 mL Mouth Rinse BID  . aspirin  81 mg Oral BID  . carvedilol  25 mg Oral BID WC  . colchicine  0.6 mg Oral BID  . furosemide  80 mg Oral BID  . hydrALAZINE  10 mg Intravenous Once  . insulin aspart  0-20 Units Subcutaneous Q4H  . insulin NPH  90 Units Subcutaneous BID AC  . methylPREDNISolone (SOLU-MEDROL) injection  125 mg Intravenous NOW  . methylPREDNISolone (SOLU-MEDROL) injection  40 mg Intravenous Q12H  . metoCLOPramide  5 mg Oral Q6H  . potassium chloride  40 mEq Oral BID  . warfarin  2.5 mg Oral ONCE-1800  . Warfarin - Pharmacist Dosing Inpatient   Does not apply q1800  . DISCONTD: carvedilol  12.5 mg Oral BID WC  . DISCONTD: cefTRIAXone (ROCEPHIN)  IV  1 g Intravenous Q24H  . DISCONTD: insulin aspart  50 Units Subcutaneous TID AC  . DISCONTD: potassium chloride  20 mEq Oral BID  . DISCONTD: potassium chloride  40 mEq Oral BID  . DISCONTD: predniSONE  50 mg Oral Q breakfast    Assessment: 61 yo M admitted 5/29 with gout pain. On chronic warfarin  for hx of DVT/PE. Home warfarin dose reported as 7.5mg  daily - last taken 5/27.  INR reduced today, regular diet, no emesis noted.  Resumed Warfarin today 6/1   Goal of Therapy:  INR 2-3   Plan:  1) Warfarin 5mg  today 2) continue daily protime  Otho Bellows , PharmD Pager: [phone] 05/27/2012,9:47 AM

## 2012-05-27 NOTE — Progress Notes (Signed)
Pt B/P-196/96, HR-59 MD notified new orders placed will continue to monitor patient. Roosvelt Maser, RN

## 2012-05-27 NOTE — Discharge Summary (Signed)
Patient ID: Mitchell Rod Sr. MRN: [id number] DOB/AGE: 06/19/1951 61 y.o.  Admit date: 05/22/2012 Discharge date: 05/27/2012  Primary Care Physician:  Toma Deiters, MD, MD  Discharge Diagnoses:  Gout flare, UTI  Present on Admission:  .UTI (lower urinary tract infection) .Hypokalemia .Vomiting .Gout .Hypertension .Obesity, morbid (more than 100 lbs over ideal weight or BMI > 40) .Diabetes mellitus .OSA on CPAP .Pulmonary embolism .DVT (deep venous thrombosis) .Rheumatoid aortitis  Active Problems:  Gout  Hypertension  Obesity, morbid (more than 100 lbs over ideal weight or BMI > 40)  OSA on CPAP  Diabetes mellitus  Pulmonary embolism  DVT (deep venous thrombosis)  UTI (lower urinary tract infection)  Hypokalemia  Vomiting  Rheumatoid aortitis   Medication List  As of 05/27/2012  9:38 AM   TAKE these medications         albuterol-ipratropium 18-103 MCG/ACT inhaler   Commonly known as: COMBIVENT   Inhale 2 puffs into the lungs every 6 (six) hours as needed. Wheezing      aspirin 81 MG tablet   Take 81 mg by mouth 2 (two) times daily.      atorvastatin 20 MG tablet   Commonly known as: LIPITOR   Take 1 tablet (20 mg total) by mouth daily at 6 PM.      calcitRIOL 0.25 MCG capsule   Commonly known as: ROCALTROL   Take 0.25 mcg by mouth daily with breakfast.      carvedilol 12.5 MG tablet   Commonly known as: COREG   Take 1 tablet (12.5 mg total) by mouth 2 (two) times daily with a meal.      colchicine 0.6 MG tablet   Take 1 tablet (0.6 mg total) by mouth daily.      furosemide 80 MG tablet   Commonly known as: LASIX   Take 80 mg by mouth 2 (two) times daily.      gabapentin 300 MG capsule   Commonly known as: NEURONTIN   Take 300 mg by mouth 2 (two) times daily.      HYDROXYCHLOROQUINE SULFATE PO   Take 300 mg by mouth 2 (two) times daily.      insulin aspart 100 UNIT/ML injection   Commonly known as: novoLOG   Inject 50 Units into the skin 3  (three) times daily before meals.      insulin NPH 100 UNIT/ML injection   Commonly known as: HUMULIN N,NOVOLIN N   Inject 90 Units into the skin 2 (two) times daily before a meal.      metoCLOPramide 5 MG tablet   Commonly known as: REGLAN   Take 1 tablet (5 mg total) by mouth 4 (four) times daily.      oxyCODONE-acetaminophen 7.5-500 MG per tablet   Commonly known as: PERCOCET   Take 1 tablet by mouth every 6 (six) hours as needed. For pain.      pravastatin 80 MG tablet   Commonly known as: PRAVACHOL   Take 80 mg by mouth at bedtime.      predniSONE 50 MG tablet   Commonly known as: DELTASONE   Take 50 mg tablet once a day and continue to taper down by 10 mg daily until complete. Call [phone] for questions      traMADol 50 MG tablet   Commonly known as: ULTRAM   Take 50 mg by mouth 2 (two) times daily.      warfarin 2.5 MG tablet   Commonly known as: COUMADIN  Take 2.5 mg by mouth daily. Taken with 5mg  tab to equal 7.5mg  daily.      warfarin 5 MG tablet   Commonly known as: COUMADIN   Take 5 mg by mouth daily. Taken with 2.5mg  daily to equal 7.5mg .            Disposition and Follow-up: Will need to follow up with PCP in 1-2 weeks post discharge. Please note that patient was discharged on prednisone taper. He has responded well to Solu-Medrol and we tapered it down to prednisone. In addition given significantly elevated uric acid level recommendation was to ask primary care physician about initiating allopurinol.  Consults:  none  Significant Diagnostic Studies:   Dg Chest 2 View 05/22/2012    IMPRESSION:  Cardiac enlargement with linear atelectasis or infiltration in the lung bases.   Shallow inspiration.  No significant interval change.     Brief H and P: Pt is 61 yo male with multiple and complex medical history including uncontrolled diabetes with complications of gastroparesis and neuropathy, s/p cholecystectomy 01/2012 who now presented to [facility] ED with main  concern of swelling in left and right foot great toe but R>L and that has been spreading to involve his knees and wrists. He reports viral gastroenteritis 1-2 weeks prior to admission with nausea, vomiting and poor oral intake, dysuria and urinary urgency and frequency, fevers, chills. He has been started on steroids in ED and has responded well to the therapy.  Physical Exam on Discharge:  Filed Vitals:   05/26/12 1900 05/26/12 2038 05/27/12 0510 05/27/12 0631  BP: 160/84 171/75 196/96 169/81  Pulse: 71 66 59   Temp: 98.3 F (36.8 C) 98.6 F (37 C) 98.2 F (36.8 C)   TempSrc: Oral Oral Oral   Resp: 19 18 18    Height:      Weight:   155.266 kg (342 lb 4.8 oz)   SpO2: 98% 98% 98%     Intake/Output Summary (Last 24 hours) at 05/27/12 0938 Last data filed at 05/27/12 0745  Gross per 24 hour  Intake    720 ml  Output    625 ml  Net     95 ml    General: Alert, awake, oriented x3, in no acute distress. Morbidly obese HEENT: No bruits, no goiter. Heart: Regular rate and rhythm, without murmurs, rubs, gallops. Lungs: Clear to auscultation bilaterally. Abdomen: Soft, nontender, nondistended, positive bowel sounds. Extremities: No clubbing cyanosis, + 2 bilateral pitting dema unchanged from admission and per pt stable, with positive pedal pulses. Chronic venous stasis changes,  Neuro: Grossly intact, nonfocal.  Lab Results:  Lab 05/27/12 0538 05/26/12 0725 05/25/12 0413 05/24/12 0420 05/23/12 0530  WBC 8.3 7.9 8.8 9.0 11.9*  HGB 14.4 13.9 13.0 13.1 13.4  HCT 42.1 40.5 38.0* 38.7* 39.5  PLT 279 323 271 240 190   Lab 05/27/12 0538 05/26/12 0725 05/25/12 0413 05/24/12 0420 05/23/12 0530  NA 135 135 129* 131* 131*  K 3.6 3.0* 3.3* 3.7 3.8  CL 99 95* 92* 91* 90*  CO2 24 29 26 29 29   GLUCOSE 136* 77 250* 278* 237*  BUN 27* 30* 29* 20 16  CREATININE 1.02 1.13 1.11 1.15 1.31  CALCIUM 8.3* 8.5 8.2* 8.6 8.2*   Lab 05/27/12 0848 05/26/12 0725 05/25/12 0413 05/24/12 0420 05/23/12 0530   INR 1.92* 2.90* 3.96* 3.44* 3.38*  PROTIME -- -- -- -- --    Hospital Course:   Active Problems:  Gout, uncontrolled and recurrent  -  pt and myself agreed on clinical improvement and no joint swelling noted on day of discharge - will continue Prednisone with taper upon discharge and pt verbalized understanding of tapering - will also continue Colchicine upon discharge  - I have recommended discussing with PCP Allopurinol upon discharge  - pt will also have to have uric acid checked in 1-2 weeks post discharge  Hypertension, uncontrolled and with complications  - will reinitiated home dose Lasix along with Corge  Obesity, morbid (more than 100 lbs over ideal weight or BMI > 40)  - nutrition consultation provided by myself   Chronic respiratory failure secondary to OSA, on CPAP  - we continued to monitor oxygen saturations as per floor protocol  - pt was able to ambulate alone with no difficulty  Diabetes mellitus, uncontrolled and with complications GASTROPARESIS  - pt was started on NPO and we have advance the diet to heart healthy which pt tolerated well - we started insulin as per home medication regimen   Pulmonary embolism  - clinically stable  - will continue coumadin   DVT (deep venous thrombosis)  - will also continue Coumadin   UTI (lower urinary tract infection), not catheter related  - continue Rocephin day #4  - Urine culture no growth  - will d/c antibiotic   Hypokalemia  - supplemented - Mg was within normal limits, 1.7  Vomiting  - secondary to gastroparesis  - we have dvanced the diet to regular and pt tolerated well - continued supportive care with antiemetics prn   Chronic kidney disease stage II  - appears to be stable and at pt's baseline   Hyponatremia  - secondary to volume status  - stable and at pt's baseline  - within normal limits this AM   EDUCATION  - test results and diagnostic studies were discussed with patient  - patient  verbalized the understanding  - questions were answered at the bedside and contact information was provided for additional questions or concerns   Time spent on Discharge: Over 30 minutes  Signed: Debbora Presto 05/27/2012, 9:38 AM  [facility], pager #: [phone] Main office number: [phone]

## 2012-05-29 LAB — CULTURE, BLOOD (ROUTINE X 2)
Culture  Setup Time: 201305290354
Culture  Setup Time: 201305290354
Culture: NO GROWTH
Culture: NO GROWTH
Special Requests: NORMAL
Special Requests: NORMAL

## 2012-10-08 ENCOUNTER — Observation Stay (HOSPITAL_COMMUNITY)
Admission: EM | Admit: 2012-10-08 | Discharge: 2012-10-09 | Disposition: A | Discharge status: Home / Self Care | Payer: Medicare Other | Attending: Internal Medicine | Admitting: Internal Medicine

## 2012-10-08 ENCOUNTER — Emergency Department (HOSPITAL_COMMUNITY): Payer: Medicare Other

## 2012-10-08 ENCOUNTER — Encounter (HOSPITAL_COMMUNITY): Payer: Self-pay | Admitting: *Deleted

## 2012-10-08 DIAGNOSIS — I509 Heart failure, unspecified: Secondary | ICD-10-CM | POA: Diagnosis present

## 2012-10-08 DIAGNOSIS — N184 Chronic kidney disease, stage 4 (severe): Secondary | ICD-10-CM | POA: Diagnosis present

## 2012-10-08 DIAGNOSIS — I82409 Acute embolism and thrombosis of unspecified deep veins of unspecified lower extremity: Secondary | ICD-10-CM

## 2012-10-08 DIAGNOSIS — Z23 Encounter for immunization: Secondary | ICD-10-CM | POA: Insufficient documentation

## 2012-10-08 DIAGNOSIS — J449 Chronic obstructive pulmonary disease, unspecified: Secondary | ICD-10-CM

## 2012-10-08 DIAGNOSIS — N189 Chronic kidney disease, unspecified: Secondary | ICD-10-CM

## 2012-10-08 DIAGNOSIS — G4733 Obstructive sleep apnea (adult) (pediatric): Secondary | ICD-10-CM

## 2012-10-08 DIAGNOSIS — N390 Urinary tract infection, site not specified: Secondary | ICD-10-CM

## 2012-10-08 DIAGNOSIS — I10 Essential (primary) hypertension: Secondary | ICD-10-CM | POA: Diagnosis present

## 2012-10-08 DIAGNOSIS — I5032 Chronic diastolic (congestive) heart failure: Principal | ICD-10-CM | POA: Insufficient documentation

## 2012-10-08 DIAGNOSIS — K8010 Calculus of gallbladder with chronic cholecystitis without obstruction: Secondary | ICD-10-CM

## 2012-10-08 DIAGNOSIS — M064 Inflammatory polyarthropathy: Secondary | ICD-10-CM

## 2012-10-08 DIAGNOSIS — R42 Dizziness and giddiness: Secondary | ICD-10-CM | POA: Diagnosis present

## 2012-10-08 DIAGNOSIS — Z9989 Dependence on other enabling machines and devices: Secondary | ICD-10-CM

## 2012-10-08 DIAGNOSIS — I2699 Other pulmonary embolism without acute cor pulmonale: Secondary | ICD-10-CM | POA: Diagnosis present

## 2012-10-08 DIAGNOSIS — R1110 Vomiting, unspecified: Secondary | ICD-10-CM

## 2012-10-08 DIAGNOSIS — I011 Acute rheumatic endocarditis: Secondary | ICD-10-CM

## 2012-10-08 DIAGNOSIS — K529 Noninfective gastroenteritis and colitis, unspecified: Secondary | ICD-10-CM

## 2012-10-08 DIAGNOSIS — Z7901 Long term (current) use of anticoagulants: Secondary | ICD-10-CM | POA: Insufficient documentation

## 2012-10-08 DIAGNOSIS — R55 Syncope and collapse: Secondary | ICD-10-CM | POA: Insufficient documentation

## 2012-10-08 DIAGNOSIS — E785 Hyperlipidemia, unspecified: Secondary | ICD-10-CM | POA: Insufficient documentation

## 2012-10-08 DIAGNOSIS — R9431 Abnormal electrocardiogram [ECG] [EKG]: Secondary | ICD-10-CM

## 2012-10-08 DIAGNOSIS — E860 Dehydration: Secondary | ICD-10-CM

## 2012-10-08 DIAGNOSIS — E876 Hypokalemia: Secondary | ICD-10-CM

## 2012-10-08 DIAGNOSIS — R778 Other specified abnormalities of plasma proteins: Secondary | ICD-10-CM

## 2012-10-08 DIAGNOSIS — N1832 Chronic kidney disease, stage 3b: Secondary | ICD-10-CM | POA: Diagnosis present

## 2012-10-08 DIAGNOSIS — G5700 Lesion of sciatic nerve, unspecified lower limb: Secondary | ICD-10-CM

## 2012-10-08 DIAGNOSIS — E119 Type 2 diabetes mellitus without complications: Secondary | ICD-10-CM | POA: Insufficient documentation

## 2012-10-08 DIAGNOSIS — Z794 Long term (current) use of insulin: Secondary | ICD-10-CM | POA: Insufficient documentation

## 2012-10-08 DIAGNOSIS — K8020 Calculus of gallbladder without cholecystitis without obstruction: Secondary | ICD-10-CM

## 2012-10-08 LAB — URINALYSIS, ROUTINE W REFLEX MICROSCOPIC
Bilirubin Urine: NEGATIVE
Glucose, UA: 100 mg/dL — AB
Ketones, ur: NEGATIVE mg/dL
Leukocytes, UA: NEGATIVE
Nitrite: NEGATIVE
Protein, ur: 30 mg/dL — AB
Specific Gravity, Urine: 1.014 (ref 1.005–1.030)
Urobilinogen, UA: 0.2 mg/dL (ref 0.0–1.0)
pH: 8 (ref 5.0–8.0)

## 2012-10-08 LAB — CBC WITH DIFFERENTIAL/PLATELET
Basophils Absolute: 0 10*3/uL (ref 0.0–0.1)
Basophils Relative: 1 % (ref 0–1)
Eosinophils Absolute: 0.1 10*3/uL (ref 0.0–0.7)
Eosinophils Relative: 2 % (ref 0–5)
HCT: 41.9 % (ref 39.0–52.0)
Hemoglobin: 14.5 g/dL (ref 13.0–17.0)
Lymphocytes Relative: 14 % (ref 12–46)
Lymphs Abs: 0.7 10*3/uL (ref 0.7–4.0)
MCH: 27.6 pg (ref 26.0–34.0)
MCHC: 34.6 g/dL (ref 30.0–36.0)
MCV: 79.8 fL (ref 78.0–100.0)
Monocytes Absolute: 0.5 10*3/uL (ref 0.1–1.0)
Monocytes Relative: 10 % (ref 3–12)
Neutro Abs: 3.6 10*3/uL (ref 1.7–7.7)
Neutrophils Relative %: 73 % (ref 43–77)
Platelets: 128 10*3/uL — ABNORMAL LOW (ref 150–400)
RBC: 5.25 MIL/uL (ref 4.22–5.81)
RDW: 14.3 % (ref 11.5–15.5)
WBC: 5 10*3/uL (ref 4.0–10.5)

## 2012-10-08 LAB — BASIC METABOLIC PANEL
BUN: 14 mg/dL (ref 6–23)
CO2: 27 mEq/L (ref 19–32)
Calcium: 9 mg/dL (ref 8.4–10.5)
Chloride: 98 mEq/L (ref 96–112)
Creatinine, Ser: 1.13 mg/dL (ref 0.50–1.35)
GFR calc Af Amer: 80 mL/min — ABNORMAL LOW (ref >90)
GFR calc non Af Amer: 69 mL/min — ABNORMAL LOW (ref >90)
Glucose, Bld: 198 mg/dL — ABNORMAL HIGH (ref 70–99)
Potassium: 3.9 mEq/L (ref 3.5–5.1)
Sodium: 135 mEq/L (ref 135–145)

## 2012-10-08 LAB — GLUCOSE, CAPILLARY
Glucose-Capillary: 208 mg/dL — ABNORMAL HIGH (ref 70–99)
Glucose-Capillary: 245 mg/dL — ABNORMAL HIGH (ref 70–99)

## 2012-10-08 LAB — URINE MICROSCOPIC-ADD ON

## 2012-10-08 LAB — PROTIME-INR
INR: 1.75 — ABNORMAL HIGH (ref 0.00–1.49)
Prothrombin Time: 19.8 seconds — ABNORMAL HIGH (ref 11.6–15.2)

## 2012-10-08 LAB — TROPONIN I
Troponin I: <0.3 ng/mL (ref <0.30)
Troponin I: <0.3 ng/mL (ref <0.30)
Troponin I: <0.3 ng/mL (ref <0.30)

## 2012-10-08 MED ORDER — ATORVASTATIN CALCIUM 20 MG PO TABS
20.0000 mg | ORAL_TABLET | Freq: Every day | ORAL | Status: DC
  Administered 2012-10-08: 20 mg via ORAL
  Filled 2012-10-08 (×2): qty 1

## 2012-10-08 MED ORDER — METOCLOPRAMIDE HCL 5 MG PO TABS
5.0000 mg | ORAL_TABLET | Freq: Four times a day (QID) | ORAL | Status: DC
  Administered 2012-10-08 – 2012-10-09 (×3): 5 mg via ORAL
  Filled 2012-10-08 (×6): qty 1

## 2012-10-08 MED ORDER — TRAMADOL HCL 50 MG PO TABS
50.0000 mg | ORAL_TABLET | Freq: Two times a day (BID) | ORAL | Status: DC
  Administered 2012-10-08: 50 mg via ORAL
  Filled 2012-10-08: qty 1

## 2012-10-08 MED ORDER — WARFARIN - PHARMACIST DOSING INPATIENT: Freq: Every day | Status: DC

## 2012-10-08 MED ORDER — HYDROCODONE-ACETAMINOPHEN 5-325 MG PO TABS
1.0000 | ORAL_TABLET | Freq: Four times a day (QID) | ORAL | Status: DC | PRN
  Administered 2012-10-08 – 2012-10-09 (×3): 1 via ORAL
  Filled 2012-10-08 (×3): qty 1

## 2012-10-08 MED ORDER — INFLUENZA VIRUS VACC SPLIT PF IM SUSP
0.5000 mL | INTRAMUSCULAR | Status: AC
  Administered 2012-10-09: 0.5 mL via INTRAMUSCULAR
  Filled 2012-10-08: qty 0.5

## 2012-10-08 MED ORDER — GABAPENTIN 300 MG PO CAPS
300.0000 mg | ORAL_CAPSULE | Freq: Two times a day (BID) | ORAL | Status: DC
  Administered 2012-10-08 – 2012-10-09 (×2): 300 mg via ORAL
  Filled 2012-10-08 (×3): qty 1

## 2012-10-08 MED ORDER — INSULIN ASPART 100 UNIT/ML ~~LOC~~ SOLN
40.0000 [IU] | Freq: Three times a day (TID) | SUBCUTANEOUS | Status: DC
  Administered 2012-10-08: 30 [IU] via SUBCUTANEOUS
  Administered 2012-10-09: 40 [IU] via SUBCUTANEOUS
  Administered 2012-10-09: 25 [IU] via SUBCUTANEOUS

## 2012-10-08 MED ORDER — CARVEDILOL 12.5 MG PO TABS
12.5000 mg | ORAL_TABLET | Freq: Two times a day (BID) | ORAL | Status: DC
  Administered 2012-10-08 – 2012-10-09 (×2): 12.5 mg via ORAL
  Filled 2012-10-08 (×4): qty 1

## 2012-10-08 MED ORDER — WARFARIN SODIUM 10 MG PO TABS
10.0000 mg | ORAL_TABLET | Freq: Once | ORAL | Status: AC
  Administered 2012-10-08: 10 mg via ORAL
  Filled 2012-10-08: qty 1

## 2012-10-08 MED ORDER — ASPIRIN EC 81 MG PO TBEC
81.0000 mg | DELAYED_RELEASE_TABLET | Freq: Two times a day (BID) | ORAL | Status: DC
  Administered 2012-10-08 – 2012-10-09 (×2): 81 mg via ORAL
  Filled 2012-10-08 (×3): qty 1

## 2012-10-08 MED ORDER — SODIUM CHLORIDE 0.9 % IJ SOLN
3.0000 mL | Freq: Two times a day (BID) | INTRAMUSCULAR | Status: DC
  Administered 2012-10-08 – 2012-10-09 (×2): 3 mL via INTRAVENOUS

## 2012-10-08 MED ORDER — SIMVASTATIN 10 MG PO TABS: 10.0000 mg | ORAL_TABLET | Freq: Every day | ORAL | Status: DC

## 2012-10-08 MED ORDER — ALLOPURINOL 300 MG PO TABS
300.0000 mg | ORAL_TABLET | Freq: Every morning | ORAL | Status: DC
  Administered 2012-10-09: 300 mg via ORAL
  Filled 2012-10-08: qty 1

## 2012-10-08 MED ORDER — NIACIN 500 MG PO TABS
500.0000 mg | ORAL_TABLET | Freq: Every evening | ORAL | Status: DC
  Administered 2012-10-08: 500 mg via ORAL
  Filled 2012-10-08 (×2): qty 1

## 2012-10-08 MED ORDER — CALCITRIOL 0.25 MCG PO CAPS
0.2500 ug | ORAL_CAPSULE | Freq: Every day | ORAL | Status: DC
Start: 2012-10-09 — End: 2012-10-09
  Administered 2012-10-09: 0.25 ug via ORAL
  Filled 2012-10-08 (×2): qty 1

## 2012-10-08 MED ORDER — HYDROXYCHLOROQUINE SULFATE 200 MG PO TABS
200.0000 mg | ORAL_TABLET | Freq: Two times a day (BID) | ORAL | Status: DC
  Administered 2012-10-08 – 2012-10-09 (×2): 200 mg via ORAL
  Filled 2012-10-08 (×3): qty 1

## 2012-10-08 MED ORDER — AMLODIPINE BESYLATE 10 MG PO TABS
10.0000 mg | ORAL_TABLET | Freq: Every day | ORAL | Status: DC
  Administered 2012-10-08 – 2012-10-09 (×2): 10 mg via ORAL
  Filled 2012-10-08 (×2): qty 1

## 2012-10-08 MED ORDER — FUROSEMIDE 80 MG PO TABS
80.0000 mg | ORAL_TABLET | Freq: Two times a day (BID) | ORAL | Status: DC
  Administered 2012-10-08 – 2012-10-09 (×2): 80 mg via ORAL
  Filled 2012-10-08 (×4): qty 1

## 2012-10-08 MED ORDER — COLCHICINE 0.6 MG PO TABS
0.6000 mg | ORAL_TABLET | Freq: Every day | ORAL | Status: DC
  Administered 2012-10-08 – 2012-10-09 (×2): 0.6 mg via ORAL
  Filled 2012-10-08 (×2): qty 1

## 2012-10-08 MED ORDER — ASPIRIN 81 MG PO TABS: 81.0000 mg | ORAL_TABLET | Freq: Two times a day (BID) | ORAL | Status: DC

## 2012-10-08 MED ORDER — ACETAMINOPHEN 325 MG PO TABS: 650.0000 mg | ORAL_TABLET | Freq: Four times a day (QID) | ORAL | Status: DC | PRN

## 2012-10-08 MED ORDER — INSULIN NPH (HUMAN) (ISOPHANE) 100 UNIT/ML ~~LOC~~ SUSP
80.0000 [IU] | Freq: Two times a day (BID) | SUBCUTANEOUS | Status: DC
  Administered 2012-10-08: 80 [IU] via SUBCUTANEOUS
  Administered 2012-10-09: 40 [IU] via SUBCUTANEOUS
  Filled 2012-10-08: qty 10

## 2012-10-08 MED ORDER — IPRATROPIUM-ALBUTEROL 18-103 MCG/ACT IN AERO
2.0000 | INHALATION_SPRAY | Freq: Four times a day (QID) | RESPIRATORY_TRACT | Status: DC | PRN
  Filled 2012-10-08: qty 14.7

## 2012-10-08 MED ORDER — ACETAMINOPHEN 650 MG RE SUPP: 650.0000 mg | Freq: Four times a day (QID) | RECTAL | Status: DC | PRN

## 2012-10-08 NOTE — H&P (Addendum)
[facility]          History and Physical    PCP:  Toma Deiters, MD  Cardiologist: Dr.Applegate at [facility]  Chief Complaint:  Dizziness  HPI: Mitchell Parker is a 61/M with h/o Diastolic CHF, prior h/o NICM with EF recovered to 60-65%, DM, H/o PE on coumadin was in his usual state of health, woke this morning felt fine, subsequently was sitting on his porch and suddenly felt dizzy/lightheaded and was sweating profusely, he became concerned and his wife called EMS He denies any CP/SoB/Orthopnea/PND/Palpitations EMS found his BP to be significantly elevated 250/90 at home, not hypoglycemic, he was brought to ER, then his symptoms resolved and BP improved to 150-160 systolic range without any medications. Workup in the ER is unremarkable, [facility] were consulted for observation for arrhythmias  Allergies:   Allergies  Allergen Reactions  . Ciprofloxacin Hives  . Levofloxacin (Levofloxacin) Hives      Past Medical History  Diagnosis Date  . CHF (congestive heart failure)     H/o nonischemic CMP with EF 17% in 2003, however echo 09/2011 with EF 60-65%. Not ischemic by cath, no MI, stents, etc  . Diabetes mellitus     On insulin  . Neuropathy   . Hypertension   . COPD (chronic obstructive pulmonary disease)   . Gallstones   . RA (rheumatoid arthritis)   . Obese   . Kidney stones   . Piriformis syndrome     left hip  . Lumbar disc disease     compression   . Sleep apnea   . Gout   . Pulmonary emboli   . DVT (deep venous thrombosis)     Past Surgical History  Procedure Date  . Umbilical hernia repair   . Pilonidal cyst excision 1997  . Varicose vein surgery     left leg  . Cholecystectomy 01/18/2012    Procedure: LAPAROSCOPIC CHOLECYSTECTOMY WITH INTRAOPERATIVE CHOLANGIOGRAM;  Surgeon: Clovis Pu. Cornett, MD;  Location: [facility];  Service: General;  Laterality: N/A;    Prior to Admission medications   Medication Sig Start Date End Date Taking?  Authorizing Provider  albuterol-ipratropium (COMBIVENT) 18-103 MCG/ACT inhaler Inhale 2 puffs into the lungs every 6 (six) hours as needed. Wheezing   Yes Historical Provider, MD  allopurinol (ZYLOPRIM) 300 MG tablet Take 300 mg by mouth every morning.   Yes Historical Provider, MD  aspirin 81 MG tablet Take 81 mg by mouth 2 (two) times daily.    Yes Historical Provider, MD  atorvastatin (LIPITOR) 20 MG tablet Take 1 tablet (20 mg total) by mouth daily at 6 PM. 04/24/12 04/24/13 Yes Gwenyth Bender, NP  calcitRIOL (ROCALTROL) 0.25 MCG capsule Take 0.25 mcg by mouth daily with breakfast.    Yes Historical Provider, MD  carvedilol (COREG) 12.5 MG tablet Take 1 tablet (12.5 mg total) by mouth 2 (two) times daily with a meal. 04/24/12 04/24/13 Yes Lesle Chris Black, NP  colchicine 0.6 MG tablet Take 1 tablet (0.6 mg total) by mouth daily. 04/24/12 04/24/13 Yes Lesle Chris Black, NP  furosemide (LASIX) 80 MG tablet Take 80 mg by mouth 2 (two) times daily.     Yes Historical Provider, MD  gabapentin (NEURONTIN) 300 MG capsule Take 300 mg by mouth 2 (two) times daily.     Yes Historical Provider, MD  HYDROcodone-acetaminophen (LORTAB) 7.5-500 MG per tablet Take 1 tablet by mouth daily as needed. Hip pain   Yes Historical Provider, MD  HYDROXYCHLOROQUINE SULFATE PO Take  300 mg by mouth 2 (two) times daily.   Yes Historical Provider, MD  insulin aspart (NOVOLOG) 100 UNIT/ML injection Inject 50 Units into the skin 3 (three) times daily before meals.   Yes Historical Provider, MD  insulin NPH (HUMULIN N,NOVOLIN N) 100 UNIT/ML injection Inject 90 Units into the skin 2 (two) times daily.    Yes Historical Provider, MD  metoCLOPramide (REGLAN) 5 MG tablet Take 1 tablet (5 mg total) by mouth 4 (four) times daily. 05/27/12  Yes Dorothea Ogle, MD  niacin 500 MG tablet Take 500 mg by mouth every evening.   Yes Historical Provider, MD  pravastatin (PRAVACHOL) 80 MG tablet Take 80 mg by mouth at bedtime.     Yes Historical Provider, MD    traMADol (ULTRAM) 50 MG tablet Take 50 mg by mouth 2 (two) times daily.    Yes Historical Provider, MD  triamcinolone cream (KENALOG) 0.1 % Apply 1 application topically daily. To legs   Yes Historical Provider, MD  warfarin (COUMADIN) 2.5 MG tablet Take 2.5 mg by mouth daily. Taken with 5mg  tab to equal 7.5mg  daily.   Yes Historical Provider, MD  warfarin (COUMADIN) 5 MG tablet Take 5 mg by mouth daily. Taken with 2.5mg  daily to equal 7.5mg .   Yes Historical Provider, MD    Social History:  reports that he quit smoking about 17 years ago. He has never used smokeless tobacco. He reports that he does not drink alcohol or use illicit drugs.  Family History  Problem Relation Age of Onset  . Hypertension Mother   . Diabetes Father   . Diabetes Brother     Review of Systems:  Constitutional: Denies fever, chills, diaphoresis, appetite change and fatigue.  HEENT: Denies photophobia, eye pain, redness, hearing loss, ear pain, congestion, sore throat, rhinorrhea, sneezing, mouth sores, trouble swallowing, neck pain, neck stiffness and tinnitus.   Respiratory: Denies SOB, DOE, cough, chest tightness,  and wheezing.   Cardiovascular: Denies chest pain, palpitations and leg swelling.  Gastrointestinal: Denies nausea, vomiting, abdominal pain, diarrhea, constipation, blood in stool and abdominal distention.  Genitourinary: Denies dysuria, urgency, frequency, hematuria, flank pain and difficulty urinating.  Musculoskeletal: Denies myalgias, back pain, joint swelling, arthralgias and gait problem.  Skin: Denies pallor, rash and wound.  Neurological: Denies dizziness, seizures, syncope, weakness, light-headedness, numbness and headaches.  Hematological: Denies adenopathy. Easy bruising, personal or family bleeding history  Psychiatric/Behavioral: Denies suicidal ideation, mood changes, confusion, nervousness, sleep disturbance and agitation   Physical Exam: Blood pressure 153/72, pulse 58,  temperature 98.5 F (36.9 C), temperature source Oral, resp. rate 17, SpO2 100.00%. Gen: AAOx3 HEENT: PERRLA, EOMI, neck obese, no JVD CVS: S1S2/RRR, no m/r/g Lungs: Clear bilaterally Abd: soft, obese, NT, BS present Ext: 1-2plus edema with chronic venous stasis and multiple scabs Neuro: moves all extremities, no localising signs Psychiatric appropriate mood and affect   Labs on Admission:  Results for orders placed during the hospital encounter of 10/08/12 (from the past 48 hour(s))  CBC WITH DIFFERENTIAL     Status: Abnormal   Collection Time   10/08/12 10:33 AM      Component Value Range Comment   WBC 5.0  4.0 - 10.5 K/uL    RBC 5.25  4.22 - 5.81 MIL/uL    Hemoglobin 14.5  13.0 - 17.0 g/dL    HCT 16.1  09.6 - 04.5 %    MCV 79.8  78.0 - 100.0 fL    MCH 27.6  26.0 - 34.0 pg  MCHC 34.6  30.0 - 36.0 g/dL    RDW 16.1  09.6 - 04.5 %    Platelets 128 (*) 150 - 400 K/uL    Neutrophils Relative 73  43 - 77 %    Neutro Abs 3.6  1.7 - 7.7 K/uL    Lymphocytes Relative 14  12 - 46 %    Lymphs Abs 0.7  0.7 - 4.0 K/uL    Monocytes Relative 10  3 - 12 %    Monocytes Absolute 0.5  0.1 - 1.0 K/uL    Eosinophils Relative 2  0 - 5 %    Eosinophils Absolute 0.1  0.0 - 0.7 K/uL    Basophils Relative 1  0 - 1 %    Basophils Absolute 0.0  0.0 - 0.1 K/uL   BASIC METABOLIC PANEL     Status: Abnormal   Collection Time   10/08/12 10:33 AM      Component Value Range Comment   Sodium 135  135 - 145 mEq/L    Potassium 3.9  3.5 - 5.1 mEq/L    Chloride 98  96 - 112 mEq/L    CO2 27  19 - 32 mEq/L    Glucose, Bld 198 (*) 70 - 99 mg/dL    BUN 14  6 - 23 mg/dL    Creatinine, Ser 4.09  0.50 - 1.35 mg/dL    Calcium 9.0  8.4 - 81.1 mg/dL    GFR calc non Af Amer 69 (*) >90 mL/min    GFR calc Af Amer 80 (*) >90 mL/min   TROPONIN I     Status: Normal   Collection Time   10/08/12 10:33 AM      Component Value Range Comment   Troponin I <0.30  <0.30 ng/mL   PROTIME-INR     Status: Abnormal    Collection Time   10/08/12 10:33 AM      Component Value Range Comment   Prothrombin Time 19.8 (*) 11.6 - 15.2 seconds    INR 1.75 (*) 0.00 - 1.49   URINALYSIS, ROUTINE W REFLEX MICROSCOPIC     Status: Abnormal   Collection Time   10/08/12 10:53 AM      Component Value Range Comment   Color, Urine YELLOW  YELLOW    APPearance CLEAR  CLEAR    Specific Gravity, Urine 1.014  1.005 - 1.030    pH 8.0  5.0 - 8.0    Glucose, UA 100 (*) NEGATIVE mg/dL    Hgb urine dipstick SMALL (*) NEGATIVE    Bilirubin Urine NEGATIVE  NEGATIVE    Ketones, ur NEGATIVE  NEGATIVE mg/dL    Protein, ur 30 (*) NEGATIVE mg/dL    Urobilinogen, UA 0.2  0.0 - 1.0 mg/dL    Nitrite NEGATIVE  NEGATIVE    Leukocytes, UA NEGATIVE  NEGATIVE   URINE MICROSCOPIC-ADD ON     Status: Normal   Collection Time   10/08/12 10:53 AM      Component Value Range Comment   Squamous Epithelial / LPF RARE  RARE    WBC, UA 0-2  <3 WBC/hpf    RBC / HPF 3-6  <3 RBC/hpf    Bacteria, UA RARE  RARE     Radiological Exams on Admission: Dg Chest 2 View  10/08/2012  *RADIOLOGY REPORT*  Clinical Data: Hypertension, dizziness, lightheaded, sweating, history CHF, COPD, smoking, diabetes, sleep apnea, pulmonary embolism  CHEST - 2 VIEW  Comparison: 07/10/2012  Findings: Enlargement of cardiac silhouette.  Tortuous aorta. Pulmonary vascular congestion. Lungs clear. No pleural effusion or pneumothorax. Bones appear demineralized.  IMPRESSION: Enlargement of cardiac silhouette with pulmonary vascular congestion. No acute abnormalities.   Original Report Authenticated By: Lollie Marrow, M.D.    EKG: NSR, p waves noted, 1 premature beat, no acute ST-T changes   Assessment/Plan  1. Presyncope: With accelerated Hypertension Unclear if triggered by arrhythmias Admit for observation, monitor on Tele Check orthostatics Cycle cardiac enzymes Last echo 4/13 with normal EF of 60%  2. Chronic Diastolic CHF: continue home dose of PO lasix, clinically  compensated  3. DM: continue lantus, novolog with meals at slightly lower dose  4. H/o PE : continue coumadin  5. HTN: BP improved back to usual range, continue coreg/lasix  6. OSA: continue CPAP  DVt proph: on anticoagulation  FULL CODE  DIspo: home tomorrow if stable  Time Spent on Admission:  Teresia Myint [facility] Pager: [phone] 10/08/2012, 3:14 PM

## 2012-10-08 NOTE — ED Notes (Signed)
Patient states slight increase in dependent edema over past day, patient with history of chf.

## 2012-10-08 NOTE — ED Notes (Signed)
Patient states this am he was sitting on porch and suddenly became lightheaded and sweaty.  Patient denies chest pain or pressure, patient states symptoms resolved at this time

## 2012-10-08 NOTE — Progress Notes (Signed)
ANTICOAGULATION CONSULT NOTE - Initial Consult  Pharmacy Consult for coumadin Indication: on coumadin PTA for hx PE and DVT  Allergies  Allergen Reactions  . Ciprofloxacin Hives  . Levofloxacin (Levofloxacin) Hives    Patient Measurements:   TBW 155.3 kg on 05/27/12  Vital Signs: Temp: 98.5 F (36.9 C) (10/14 1019) Temp src: Oral (10/14 1019) BP: 153/72 mmHg (10/14 1501) Pulse Rate: 58  (10/14 1501)  Labs:  Basename 10/08/12 1033  HGB 14.5  HCT 41.9  PLT 128*  APTT --  LABPROT 19.8*  INR 1.75*  HEPARINUNFRC --  CREATININE 1.13  CKTOTAL --  CKMB --  TROPONINI <0.30    The CrCl is unknown because both a height and weight (above a minimum accepted value) are required for this calculation.   Medical History: Past Medical History  Diagnosis Date  . CHF (congestive heart failure)     H/o nonischemic CMP with EF 17% in 2003, however echo 09/2011 with EF 60-65%. Not ischemic by cath, no MI, stents, etc  . Diabetes mellitus     On insulin  . Neuropathy   . Hypertension   . COPD (chronic obstructive pulmonary disease)   . Gallstones   . RA (rheumatoid arthritis)   . Obese   . Kidney stones   . Piriformis syndrome     left hip  . Lumbar disc disease     compression   . Sleep apnea   . Gout   . Pulmonary emboli   . DVT (deep venous thrombosis)     Medications:  Home coumadin dose = 2.5 mg tab + 5 mg tab = 7.5 mg daily,  Last dose taken 10/07/12.  Assessment: 61 yo man admitted with dizziness. PMH sign for diastolic CHF, NICM, EF 60-65%. He is on coumadin for a hx of PE and DVT.  His home coumadin dose = 2.5 mg tab + 5 mg tab = 7.5 mg daily,  Last dose taken 10/07/12.  INR today is 1.75. This is below 2-3 goal.  His PLT is low at 128.  H/H WNL.  No bleeding reported.  Goal of Therapy:  INR 2-3    Plan:   1. Coumadin 10 mg po x 1 dose today 2. Daily INR 3. Pt admitted to observe for arrhythmias.  If stable will be sent home tomorrow. Herby Abraham,  Pharm.D. [phone] 10/08/2012 3:39 PM

## 2012-10-08 NOTE — Progress Notes (Signed)
Called to room by patient. Pt stated "I just started sweating." Denies dizziness, palpitations, chest pain, shortness of breath prior or during event.  BP elevated at 178/90 otherwise VSS. CBG: 242. No arrhythmias seen on the monitor. Dr. Jomarie Longs paged and made aware. New orders received. Will continue to monitor.

## 2012-10-09 DIAGNOSIS — R9431 Abnormal electrocardiogram [ECG] [EKG]: Secondary | ICD-10-CM

## 2012-10-09 DIAGNOSIS — N189 Chronic kidney disease, unspecified: Secondary | ICD-10-CM

## 2012-10-09 DIAGNOSIS — J449 Chronic obstructive pulmonary disease, unspecified: Secondary | ICD-10-CM

## 2012-10-09 LAB — CBC
HCT: 42.2 % (ref 39.0–52.0)
Hemoglobin: 14.4 g/dL (ref 13.0–17.0)
MCH: 27.4 pg (ref 26.0–34.0)
MCHC: 34.1 g/dL (ref 30.0–36.0)
MCV: 80.2 fL (ref 78.0–100.0)
Platelets: 134 10*3/uL — ABNORMAL LOW (ref 150–400)
RBC: 5.26 MIL/uL (ref 4.22–5.81)
RDW: 14.5 % (ref 11.5–15.5)
WBC: 4.5 10*3/uL (ref 4.0–10.5)

## 2012-10-09 LAB — COMPREHENSIVE METABOLIC PANEL
ALT: 31 U/L (ref 0–53)
AST: 41 U/L — ABNORMAL HIGH (ref 0–37)
Albumin: 3.6 g/dL (ref 3.5–5.2)
Alkaline Phosphatase: 67 U/L (ref 39–117)
BUN: 14 mg/dL (ref 6–23)
CO2: 27 mEq/L (ref 19–32)
Calcium: 8.8 mg/dL (ref 8.4–10.5)
Chloride: 99 mEq/L (ref 96–112)
Creatinine, Ser: 1.07 mg/dL (ref 0.50–1.35)
GFR calc Af Amer: 86 mL/min — ABNORMAL LOW (ref >90)
GFR calc non Af Amer: 74 mL/min — ABNORMAL LOW (ref >90)
Glucose, Bld: 130 mg/dL — ABNORMAL HIGH (ref 70–99)
Potassium: 4.2 mEq/L (ref 3.5–5.1)
Sodium: 136 mEq/L (ref 135–145)
Total Bilirubin: 0.6 mg/dL (ref 0.3–1.2)
Total Protein: 7 g/dL (ref 6.0–8.3)

## 2012-10-09 LAB — GLUCOSE, CAPILLARY
Glucose-Capillary: 167 mg/dL — ABNORMAL HIGH (ref 70–99)
Glucose-Capillary: 137 mg/dL — ABNORMAL HIGH (ref 70–99)
Glucose-Capillary: 148 mg/dL — ABNORMAL HIGH (ref 70–99)

## 2012-10-09 LAB — PROTIME-INR
INR: 1.58 — ABNORMAL HIGH (ref 0.00–1.49)
Prothrombin Time: 18.4 seconds — ABNORMAL HIGH (ref 11.6–15.2)

## 2012-10-09 MED ORDER — LISINOPRIL 10 MG PO TABS: 10.0000 mg | ORAL_TABLET | Freq: Every day | ORAL | Status: DC

## 2012-10-09 MED ORDER — TRAMADOL HCL 50 MG PO TABS
100.0000 mg | ORAL_TABLET | Freq: Two times a day (BID) | ORAL | Status: DC
  Administered 2012-10-09: 100 mg via ORAL
  Filled 2012-10-09: qty 2

## 2012-10-09 MED ORDER — ASPIRIN 81 MG PO TABS: 81.0000 mg | ORAL_TABLET | Freq: Every day | ORAL | Status: DC

## 2012-10-09 MED ORDER — WARFARIN SODIUM 10 MG PO TABS
10.0000 mg | ORAL_TABLET | Freq: Once | ORAL | Status: DC
  Filled 2012-10-09: qty 1

## 2012-10-09 NOTE — Discharge Summary (Signed)
Physician Discharge Summary  Mitchell Parker:[id number] DOB: [birth year] DOA: 10/08/2012  PCP: Toma Deiters, MD  Admit date: 10/08/2012 Discharge date: 10/09/2012  Recommendations for Outpatient Follow-up:  1. Follow up with PCP within one week for follow up of episodic high blood pressure and sweating.  PCP to call [facility] labs to obtain results of serum metanephrines.  Consider outpatient renal artery [place] if episodes continue.   2. Please take warfarin 10mg  at night for the next 3 nights and then go back to your previous dose.  Have your INR checked next week.   3. Please follow up with cardiologist within one week. 4. Stop niacin because no proven mortality benefit in setting of statin 5. Start lisinopril for blood pressure and diabetes 6. Decrease aspirin to one baby aspirin while on warfarin.    Discharge Diagnoses:  Active Problems:  CHF (congestive heart failure)  Diabetes mellitus  Pulmonary embolism  Dizziness  HTN (hypertension), malignant   Discharge Condition: stable, improved  Diet recommendation: healthy heart, diabetic diet  Wt Readings from Last 3 Encounters:  10/08/12 152.3 kg (335 lb 12.2 oz)  05/27/12 155.266 kg (342 lb 4.8 oz)  04/24/12 159.2 kg (350 lb 15.6 oz)    History of present illness:   Mr.Ehrich is a 61/M with h/o diastolic CHF, prior h/o NICM with EF recovered to 60-65%, DM, H/o PE on coumadin was in his usual state of health, woke this morning felt fine, subsequently was sitting on his porch and suddenly felt dizzy/lightheaded and was sweating profusely, he became concerned and his wife called EMS  He denies any CP/SoB/Orthopnea/PND/Palpitations  EMS found his BP to be significantly elevated 250/90 at home, not hypoglycemic, he was brought to ER, then his symptoms resolved and BP improved to 150-160 systolic range without any medications.  Workup in the ER is unremarkable, [facility] were consulted for observation for  arrhythmias   Hospital Course:   1. Presyncope:  DDx included arrhythmias, dysautonomia possibly secondary to DM, endocrinopathy.  ECG demonstrated sinus arrhythmia.  Troponins were negative x 3.  Echo from 4/13 demonstrated preserved EF of 60%.  Orthostatics were negative.  Telemetry demonstrated mild intermittent sinus bradycardia.  Blood pressure trended down slowly with continuation of home medications and addition of lisinopril.  A serum metanephrines test was sent and is pending at time of discharge.  Consider renal artery stenosis if episode recurs and consider renal artery imaging as an outpatient.    2. Chronic Diastolic CHF:  He continued his home dose of PO lasix, clinically compensated  3. DM: Continued lantus, novolog with meals at slightly lower dose but he may resume home dose as outpatient. 4. H/o PE : continue coumadin. INR was mildly subtherapeutic at 1.58 at time of discharge so patient advised to take 10mg  of warfarin x 3 nights, then go back to previous dose and have INR checked in 1 week. 5. HTN/HLD:  BP improved back to usual range, continued coreg/lasix and add lisinopril.  Stop niacin because no proven mortality benefit in setting of statin. 6. OSA: Continued CPAP     Procedures:  None  Consultations:  None  Discharge Exam: Filed Vitals:   10/09/12 1017  BP: 146/76  Pulse:   Temp:   Resp:    Filed Vitals:   10/09/12 0603 10/09/12 0745 10/09/12 0746 10/09/12 1017  BP: 168/80 164/72  146/76  Pulse:   65   Temp:      TempSrc:  Resp:      Height:      Weight:      SpO2:        General: Obese CM, no acute distress, sitting in chair HEENT:  Anicteric, MMM Cardiovascular:  RRR, no murmurs, rubs, or gallops Respiratory: CTAB Ext:  2+ radial and right pedal pulses, unable to palpate left pedal pulse.  Trace LEE Skin:  Chronic hyperpigmentation and some scabs and skin breakdown of shins  Discharge Instructions      Discharge Orders    Future  Orders Please Complete By Expires   Diet - low sodium heart healthy      Increase activity slowly      Discharge instructions      Comments:   You were hospitalized with an episode of sweating and severe high blood pressure.  You did not have a heart attack.  You had slow heart beat on telemetry to the 50s occasionally with some sinus arrhythmia.  You had an ultrasound of your heart a few months ago that showed your heart function was normal.  You had a blood test for pheochromocytoma which is pending at the time of discharge.  You should start a low dose of lisinopril for blood pressure and because you have diabetes.  Please stop niacin because there is no proven mortality benefit to this medication if you are already taking a statin medication such as pravastatin. Decrease your aspirin to once a day because you are on warfarin.   Call MD for:      Comments:   Call 911 if you have chest pain, shortness, another episode, slurred speech, numbness or weakness of an arm or leg, facial droop, or confusion   Call MD for:  temperature >100.4      Call MD for:  persistant nausea and vomiting      Call MD for:  severe uncontrolled pain      Call MD for:  difficulty breathing, headache or visual disturbances      Call MD for:  hives      Call MD for:  persistant dizziness or light-headedness      Call MD for:  extreme fatigue      (HEART FAILURE PATIENTS) Call MD:  Anytime you have any of the following symptoms: 1) 3 pound weight gain in 24 hours or 5 pounds in 1 week 2) shortness of breath, with or without a dry hacking cough 3) swelling in the hands, feet or stomach 4) if you have to sleep on extra pillows at night in order to breathe.          Medication List     As of 10/09/2012 12:57 PM    STOP taking these medications         atorvastatin 20 MG tablet   Commonly known as: LIPITOR      niacin 500 MG tablet      TAKE these medications         albuterol-ipratropium 18-103 MCG/ACT inhaler    Commonly known as: COMBIVENT   Inhale 2 puffs into the lungs every 6 (six) hours as needed. Wheezing      allopurinol 300 MG tablet   Commonly known as: ZYLOPRIM   Take 300 mg by mouth every morning.      aspirin 81 MG tablet   Take 1 tablet (81 mg total) by mouth daily.      calcitRIOL 0.25 MCG capsule   Commonly known as: ROCALTROL   Take 0.25  mcg by mouth daily with breakfast.      carvedilol 12.5 MG tablet   Commonly known as: COREG   Take 1 tablet (12.5 mg total) by mouth 2 (two) times daily with a meal.      colchicine 0.6 MG tablet   Take 1 tablet (0.6 mg total) by mouth daily.      furosemide 80 MG tablet   Commonly known as: LASIX   Take 80 mg by mouth 2 (two) times daily.      gabapentin 300 MG capsule   Commonly known as: NEURONTIN   Take 300 mg by mouth 2 (two) times daily.      HYDROcodone-acetaminophen 7.5-500 MG per tablet   Commonly known as: LORTAB   Take 1 tablet by mouth daily as needed. Hip pain      HYDROXYCHLOROQUINE SULFATE PO   Take 300 mg by mouth 2 (two) times daily.      insulin aspart 100 UNIT/ML injection   Commonly known as: novoLOG   Inject 50 Units into the skin 3 (three) times daily before meals.      insulin NPH 100 UNIT/ML injection   Commonly known as: HUMULIN N,NOVOLIN N   Inject 90 Units into the skin 2 (two) times daily.      lisinopril 10 MG tablet   Commonly known as: PRINIVIL,ZESTRIL   Take 1 tablet (10 mg total) by mouth daily.      metoCLOPramide 5 MG tablet   Commonly known as: REGLAN   Take 1 tablet (5 mg total) by mouth 4 (four) times daily.      pravastatin 80 MG tablet   Commonly known as: PRAVACHOL   Take 80 mg by mouth at bedtime.      traMADol 50 MG tablet   Commonly known as: ULTRAM   Take 50 mg by mouth 2 (two) times daily.      triamcinolone cream 0.1 %   Commonly known as: KENALOG   Apply 1 application topically daily. To legs      warfarin 2.5 MG tablet   Commonly known as: COUMADIN   Take  2.5 mg by mouth daily. Taken with 5mg  tab to equal 7.5mg  daily.      warfarin 5 MG tablet   Commonly known as: COUMADIN   Take 5 mg by mouth daily. Taken with 2.5mg  daily to equal 7.5mg .        Follow-up Information    Follow up with HASANAJ,XAJE A, MD. Schedule an appointment as soon as possible for a visit in 1 week.      Follow up with APPLEGATE,ROBERT, MD. Schedule an appointment as soon as possible for a visit in 1 week.          The results of significant diagnostics from this hospitalization (including imaging, microbiology, ancillary and laboratory) are listed below for reference.    Significant Diagnostic Studies: Dg Chest 2 View  10/08/2012  *RADIOLOGY REPORT*  Clinical Data: Hypertension, dizziness, lightheaded, sweating, history CHF, COPD, smoking, diabetes, sleep apnea, pulmonary embolism  CHEST - 2 VIEW  Comparison: 07/10/2012  Findings: Enlargement of cardiac silhouette. Tortuous aorta. Pulmonary vascular congestion. Lungs clear. No pleural effusion or pneumothorax. Bones appear demineralized.  IMPRESSION: Enlargement of cardiac silhouette with pulmonary vascular congestion. No acute abnormalities.   Original Report Authenticated By: Lollie Marrow, M.D.     Microbiology: No results found for this or any previous visit (from the past 240 hour(s)).   Labs: Basic Metabolic Panel:  Lab  10/09/12 0553 10/08/12 1033  NA 136 135  K 4.2 3.9  CL 99 98  CO2 27 27  GLUCOSE 130* 198*  BUN 14 14  CREATININE 1.07 1.13  CALCIUM 8.8 9.0  MG -- --  PHOS -- --   Liver Function Tests:  Lab 10/09/12 0553  AST 41*  ALT 31  ALKPHOS 67  BILITOT 0.6  PROT 7.0  ALBUMIN 3.6   No results found for this basename: LIPASE:5,AMYLASE:5 in the last 168 hours No results found for this basename: AMMONIA:5 in the last 168 hours CBC:  Lab 10/09/12 0553 10/08/12 1033  WBC 4.5 5.0  NEUTROABS -- 3.6  HGB 14.4 14.5  HCT 42.2 41.9  MCV 80.2 79.8  PLT 134* 128*   Cardiac  Enzymes:  Lab 10/08/12 2139 10/08/12 1758 10/08/12 1033  CKTOTAL -- -- --  CKMB -- -- --  CKMBINDEX -- -- --  TROPONINI <0.30 <0.30 <0.30   BNP: BNP (last 3 results)  Basename 04/17/12 2015 10/22/11 0416  PROBNP 11286.0* 1210.0*   CBG:  Lab 10/09/12 1122 10/09/12 0734 10/08/12 2020 10/08/12 1728 10/08/12 1650  GLUCAP 148* 137* 167* 245* 208*    Time coordinating discharge: 45 minutes  Signed:  Perrin Gens  [facility] 10/09/2012, 12:57 PM

## 2012-10-09 NOTE — Care Management Note (Unsigned)
    Page 1 of 1   10/09/2012     11:38:17 AM   CARE MANAGEMENT NOTE 10/09/2012  Patient:  Mitchell Parker, Mitchell Parker   Account Number:  [id number]  Date Initiated:  10/09/2012  Documentation initiated by:  GRAVES-BIGELOW,Benjie Ricketson  Subjective/Objective Assessment:   Pt admitted with dizziness. Pt is from home with wife. Pt has PCP on file.     Action/Plan:   CM will continue to monitor for any disposition needs.   Anticipated DC Date:  10/09/2012   Anticipated DC Plan:  HOME/SELF CARE      DC Planning Services  CM consult      Choice offered to / List presented to:             Status of service:  In process, will continue to follow Medicare Important Message given?   (If response is "NO", the following Medicare IM given date fields will be blank) Date Medicare IM given:   Date Additional Medicare IM given:    Discharge Disposition:    Per UR Regulation:  Reviewed for med. necessity/level of care/duration of stay  If discussed at Long Length of Stay Meetings, dates discussed:    Comments:

## 2012-10-09 NOTE — Progress Notes (Signed)
Patient refused to wear cpap tonight. Patient is going to bring home unit in to use. RT will continue to monitor.

## 2012-10-09 NOTE — Progress Notes (Signed)
UR Completed Rheda Kassab Graves-Bigelow, RN,BSN [phone]  

## 2012-10-09 NOTE — Progress Notes (Signed)
ANTICOAGULATION CONSULT NOTE - Follow Up Consult  Pharmacy Consult for Coumadin Indication: PE history  Allergies  Allergen Reactions  . Ciprofloxacin Hives  . Levofloxacin (Levofloxacin) Hives   Labs:  [facility] 10/09/12 0553 10/08/12 2139 10/08/12 1758 10/08/12 1033  HGB 14.4 -- -- 14.5  HCT 42.2 -- -- 41.9  PLT 134* -- -- 128*  APTT -- -- -- --  LABPROT 18.4* -- -- 19.8*  INR 1.58* -- -- 1.75*  HEPARINUNFRC -- -- -- --  CREATININE 1.07 -- -- 1.13  CKTOTAL -- -- -- --  CKMB -- -- -- --  TROPONINI -- <0.30 <0.30 <0.30    Estimated Creatinine Clearance: 114.5 ml/min (by C-G formula based on Cr of 1.07).  Assessment: On Coumadin PTA for PE No bleeding noted  Goal of Therapy:  INR 2-3 Monitor platelets by anticoagulation protocol: Yes   Plan:  1) Coumadin 10 mg po x 1 dose 2) Recommend increasing Coumadin to 10 mg to MWF, 7.5 mg other days  Thank you. Okey Regal, PharmD [phone]  10/09/2012,9:07 AM

## 2012-10-09 NOTE — Progress Notes (Signed)
DC orders received.  Patient stable with no S/S of distress.  Medication and discharge information reviewed with patient and patient's wife.  Patient DC home. Phillips, Liana Camerer Marie  

## 2012-10-14 LAB — METANEPHRINES, PLASMA
Metanephrine, Free: <25 pg/mL (ref <=57)
Normetanephrine, Free: 58 pg/mL (ref <=148)
Total Metanephrines-Plasma: 58 pg/mL (ref <=205)

## 2012-10-31 NOTE — ED Provider Notes (Signed)
History     CSN: [id number]  Arrival date & time 10/08/12  1006   First MD Initiated Contact with Patient 10/08/12 1014      Chief Complaint  Patient presents with  . Dizziness    (Consider location/radiation/quality/duration/timing/severity/associated sxs/prior treatment) HPI The patient presents to the ER with sudden onset of diaphoresis and lightheadedness with near-syncope.  Patient states that he was sitting on his front porch when he began to sweat becoming dizzy and felt like he was going to pass out.  Patient denies chest pain, shortness of breath, nausea, vomiting, diarrhea, abdominal pain, back pain, visual changes, headache, or syncope.  Patient states that he did not take anything prior to arrival for symptoms.  Patient stated this lasted for about 15-30 minutes and seemed to pass.  Patient does have an extensive cardiac history Past Medical History  Diagnosis Date  . CHF (congestive heart failure)     H/o nonischemic CMP with EF 17% in 2003, however echo 09/2011 with EF 60-65%. Not ischemic by cath, no MI, stents, etc  . Diabetes mellitus     On insulin  . Neuropathy   . Hypertension   . COPD (chronic obstructive pulmonary disease)   . Gallstones   . RA (rheumatoid arthritis)   . Obese   . Kidney stones   . Piriformis syndrome     left hip  . Lumbar disc disease     compression   . Sleep apnea   . Gout   . Pulmonary emboli   . DVT (deep venous thrombosis)     Past Surgical History  Procedure Date  . Umbilical hernia repair   . Pilonidal cyst excision 1997  . Varicose vein surgery     left leg  . Cholecystectomy 01/18/2012    Procedure: LAPAROSCOPIC CHOLECYSTECTOMY WITH INTRAOPERATIVE CHOLANGIOGRAM;  Surgeon: Clovis Pu. Cornett, MD;  Location: [facility];  Service: General;  Laterality: N/A;    Family History  Problem Relation Age of Onset  . Hypertension Mother   . Diabetes Father   . Diabetes Brother     History  Substance Use Topics  . Smoking  status: Former Smoker    Quit date: 12/26/1994  . Smokeless tobacco: Never Used  . Alcohol Use: No      Review of Systems All other systems negative except as documented in the HPI. All pertinent positives and negatives as reviewed in the HPI.  Allergies  Ciprofloxacin and Levofloxacin  Home Medications   Current Outpatient Rx  Name  Route  Sig  Dispense  Refill  . IPRATROPIUM-ALBUTEROL 18-103 MCG/ACT IN AERO   Inhalation   Inhale 2 puffs into the lungs every 6 (six) hours as needed. Wheezing         . ALLOPURINOL 300 MG PO TABS   Oral   Take 300 mg by mouth every morning.         [name] CALCITRIOL 0.25 MCG PO CAPS   Oral   Take 0.25 mcg by mouth daily with breakfast.          . CARVEDILOL 12.5 MG PO TABS   Oral   Take 1 tablet (12.5 mg total) by mouth 2 (two) times daily with a meal.   30 tablet   0   . COLCHICINE 0.6 MG PO TABS   Oral   Take 1 tablet (0.6 mg total) by mouth daily.   30 tablet   0   . FUROSEMIDE 80 MG PO TABS   Oral  Take 80 mg by mouth 2 (two) times daily.           [name] GABAPENTIN 300 MG PO CAPS   Oral   Take 300 mg by mouth 2 (two) times daily.           [name] HYDROCODONE-ACETAMINOPHEN 7.5-500 MG PO TABS   Oral   Take 1 tablet by mouth daily as needed. Hip pain         . HYDROXYCHLOROQUINE SULFATE PO   Oral   Take 300 mg by mouth 2 (two) times daily.         . INSULIN ASPART 100 UNIT/ML [place] SOLN   Subcutaneous   Inject 50 Units into the skin 3 (three) times daily before meals.         . INSULIN ISOPHANE HUMAN 100 UNIT/ML [place] SUSP   Subcutaneous   Inject 90 Units into the skin 2 (two) times daily.          [name] METOCLOPRAMIDE HCL 5 MG PO TABS   Oral   Take 1 tablet (5 mg total) by mouth 4 (four) times daily.   65 tablet   0   . PRAVASTATIN SODIUM 80 MG PO TABS   Oral   Take 80 mg by mouth at bedtime.           . TRAMADOL HCL 50 MG PO TABS   Oral   Take 50 mg by mouth 2 (two) times daily.          . TRIAMCINOLONE  ACETONIDE 0.1 % EX CREA   Topical   Apply 1 application topically daily. To legs         . WARFARIN SODIUM 2.5 MG PO TABS   Oral   Take 2.5 mg by mouth daily. Taken with 5mg  tab to equal 7.5mg  daily.         . WARFARIN SODIUM 5 MG PO TABS   Oral   Take 5 mg by mouth daily. Taken with 2.5mg  daily to equal 7.5mg .         . ASPIRIN 81 MG PO TABS   Oral   Take 1 tablet (81 mg total) by mouth daily.   90 tablet   3   . LISINOPRIL 10 MG PO TABS   Oral   Take 1 tablet (10 mg total) by mouth daily.   30 tablet   3     BP 169/94  Pulse 72  Temp 98 F (36.7 C) (Oral)  Resp 18  Ht 6\' 1"  (1.854 m)  Wt 335 lb 12.2 oz (152.3 kg)  BMI 44.30 kg/m2  SpO2 97%  Physical Exam  Nursing note and vitals reviewed. Constitutional: He is oriented to person, place, and time. He appears well-developed and well-nourished.  HENT:  Head: Normocephalic and atraumatic.  Mouth/Throat: Oropharynx is clear and moist.  Eyes: EOM are normal. Pupils are equal, round, and reactive to light.  Neck: Normal range of motion. Neck supple. No JVD present.  Cardiovascular: Normal rate and regular rhythm.  Exam reveals no gallop and no friction rub.   No murmur heard. Pulmonary/Chest: Effort normal and breath sounds normal.  Abdominal: Soft. Bowel sounds are normal. He exhibits no distension. There is no tenderness. There is no guarding.  Neurological: He is alert and oriented to person, place, and time. He exhibits normal muscle tone. Coordination normal.  Skin: Skin is warm and dry. No rash noted.    ED Course  Procedures (including critical care time)  Labs Reviewed  CBC WITH DIFFERENTIAL - Abnormal; Notable for the following:    Platelets 128 (*)     All other components within normal limits  BASIC METABOLIC PANEL - Abnormal; Notable for the following:    Glucose, Bld 198 (*)     GFR calc non Af Amer 69 (*)     GFR calc Af Amer 80 (*)     All other components within normal limits    URINALYSIS, ROUTINE W REFLEX MICROSCOPIC - Abnormal; Notable for the following:    Glucose, UA 100 (*)     Hgb urine dipstick SMALL (*)     Protein, ur 30 (*)     All other components within normal limits  PROTIME-INR - Abnormal; Notable for the following:    Prothrombin Time 19.8 (*)     INR 1.75 (*)     All other components within normal limits  GLUCOSE, CAPILLARY - Abnormal; Notable for the following:    Glucose-Capillary 208 (*)     All other components within normal limits  CBC - Abnormal; Notable for the following:    Platelets 134 (*)     All other components within normal limits  COMPREHENSIVE METABOLIC PANEL - Abnormal; Notable for the following:    Glucose, Bld 130 (*)     AST 41 (*)     GFR calc non Af Amer 74 (*)     GFR calc Af Amer 86 (*)     All other components within normal limits  PROTIME-INR - Abnormal; Notable for the following:    Prothrombin Time 18.4 (*)     INR 1.58 (*)     All other components within normal limits  GLUCOSE, CAPILLARY - Abnormal; Notable for the following:    Glucose-Capillary 245 (*)     All other components within normal limits  GLUCOSE, CAPILLARY - Abnormal; Notable for the following:    Glucose-Capillary 167 (*)     All other components within normal limits  GLUCOSE, CAPILLARY - Abnormal; Notable for the following:    Glucose-Capillary 137 (*)     All other components within normal limits  GLUCOSE, CAPILLARY - Abnormal; Notable for the following:    Glucose-Capillary 148 (*)     All other components within normal limits  TROPONIN I  URINE MICROSCOPIC-ADD ON  TROPONIN I  TROPONIN I  METANEPHRINES, PLASMA  LAB REPORT - SCANNED   Patient be admitted to the hospital for possible arrhythmia that caused his symptoms.  Patient has been rechecked x3.  Patient has been stable during his entire visit here in the emergency department without any symptoms.  1. Near syncope   2. CHF (congestive heart failure)   3. Diabetes mellitus    4. OSA on CPAP   5. Pulmonary embolism   6. Rheumatoid aortitis   7. Dizziness   8. HTN (hypertension), malignant   9. Piriformis syndrome   10. Cholelithiasis with cholecystitis   11. Hypertension   12. Obesity, morbid (more than 100 lbs over ideal weight or BMI > 40)   13. Cholelithiases   14. Obesity, morbid   15. COPD (chronic obstructive pulmonary disease)   16. Syncope and collapse   17. Dehydration   18. Chronic diarrhea   19. Elevated troponin   20. Abnormal ECG   21. Chronic renal insufficiency   22. DVT (deep venous thrombosis)   23. UTI (lower urinary tract infection)   24. Hypokalemia   25. Vomiting  MDM   Date: 10/31/2012  Rate: 59  Rhythm: atrial fibrillation  QRS Axis: normal  Intervals: normal  ST/T Wave abnormalities: normal  Conduction Disutrbances:none  Narrative Interpretation:   Old EKG Reviewed: unchanged   MDM Reviewed: nursing note and vitals Reviewed previous: labs and ECG Interpretation: labs, ECG and x-ray Consults: admitting MD           Carlyle Dolly, PA-C 10/31/12 [phone]

## 2012-10-31 NOTE — ED Provider Notes (Signed)
Medical screening examination/treatment/procedure(s) were performed by non-physician practitioner and as supervising physician I was immediately available for consultation/collaboration.   Shelda Jakes, MD 10/31/12 [phone]

## 2012-11-20 ENCOUNTER — Other Ambulatory Visit (HOSPITAL_COMMUNITY): Payer: Self-pay | Admitting: Cardiovascular Disease

## 2012-11-20 DIAGNOSIS — R0989 Other specified symptoms and signs involving the circulatory and respiratory systems: Secondary | ICD-10-CM

## 2012-11-20 DIAGNOSIS — I82409 Acute embolism and thrombosis of unspecified deep veins of unspecified lower extremity: Secondary | ICD-10-CM

## 2012-11-20 DIAGNOSIS — I509 Heart failure, unspecified: Secondary | ICD-10-CM

## 2012-12-03 ENCOUNTER — Ambulatory Visit (HOSPITAL_COMMUNITY)
Admission: RE | Admit: 2012-12-03 | Discharge: 2012-12-03 | Disposition: A | Discharge status: Home / Self Care | Payer: Medicare Other | Source: Ambulatory Visit | Attending: Cardiovascular Disease | Admitting: Cardiovascular Disease

## 2012-12-03 DIAGNOSIS — I82409 Acute embolism and thrombosis of unspecified deep veins of unspecified lower extremity: Secondary | ICD-10-CM

## 2012-12-03 NOTE — Progress Notes (Signed)
BLE venous duplex completed. Notnamed Croucher D  

## 2012-12-03 NOTE — Progress Notes (Signed)
BLE venous duplex completed. No evidence of acute DVT noted. Chauncy Lean

## 2012-12-11 ENCOUNTER — Ambulatory Visit (HOSPITAL_COMMUNITY)
Admission: RE | Admit: 2012-12-11 | Discharge: 2012-12-11 | Disposition: A | Discharge status: Home / Self Care | Payer: Medicare Other | Source: Ambulatory Visit | Attending: Cardiovascular Disease | Admitting: Cardiovascular Disease

## 2012-12-11 DIAGNOSIS — I739 Peripheral vascular disease, unspecified: Secondary | ICD-10-CM | POA: Insufficient documentation

## 2012-12-11 DIAGNOSIS — I10 Essential (primary) hypertension: Secondary | ICD-10-CM | POA: Insufficient documentation

## 2012-12-11 DIAGNOSIS — F17200 Nicotine dependence, unspecified, uncomplicated: Secondary | ICD-10-CM | POA: Insufficient documentation

## 2012-12-11 DIAGNOSIS — E669 Obesity, unspecified: Secondary | ICD-10-CM | POA: Insufficient documentation

## 2012-12-11 DIAGNOSIS — R0989 Other specified symptoms and signs involving the circulatory and respiratory systems: Secondary | ICD-10-CM

## 2012-12-11 DIAGNOSIS — I509 Heart failure, unspecified: Secondary | ICD-10-CM

## 2012-12-11 DIAGNOSIS — I4510 Unspecified right bundle-branch block: Secondary | ICD-10-CM | POA: Insufficient documentation

## 2012-12-11 MED ORDER — AMINOPHYLLINE 25 MG/ML IV SOLN
75.0000 mg | Freq: Once | INTRAVENOUS | Status: AC
  Administered 2012-12-11: 75 mg via INTRAVENOUS

## 2012-12-11 MED ORDER — TECHNETIUM TC 99M SESTAMIBI GENERIC - CARDIOLITE
30.8000 | Freq: Once | INTRAVENOUS | Status: AC | PRN
  Administered 2012-12-11: 30.8 via INTRAVENOUS

## 2012-12-11 MED ORDER — REGADENOSON 0.4 MG/5ML IV SOLN
0.4000 mg | Freq: Once | INTRAVENOUS | Status: AC
  Administered 2012-12-11: 0.4 mg via INTRAVENOUS

## 2012-12-11 MED ORDER — TECHNETIUM TC 99M SESTAMIBI GENERIC - CARDIOLITE
10.9000 | Freq: Once | INTRAVENOUS | Status: AC | PRN
  Administered 2012-12-11: 10.9 via INTRAVENOUS

## 2012-12-11 NOTE — Procedures (Addendum)
[facility]ARD[facility]OVASCULAR IMAG[facility] [address] [place] [address] [phone]  Cardiology Nuclear Med Study  Mitchell Parker. is a 61 y.o. male     MRN : [id number]     DOB: October 10, 1951  Procedure Date: 12/11/2012  Nuclear Med Background Indication for Stress Test:  Evaluation for Ischemia, History:  No prior cardiac history Cardiac Risk Factors: Hypertension, Obesity and Smoker, IRBBB, PVD  Symptoms:  DOE   Nuclear Pre-Procedure Caffeine/Decaff Intake:  None NPO After: 7:00am   IV Site: R Hand  IV 0.9% NS with Angio Cath:  22g  Chest Size (in):  54+ IV Started by: Bonnita Levan, RN  Height: 6\' 1"  (1.854 m)  Cup Size: n/a  BMI:  Body mass index is 44.99 kg/(m^2). Weight:  341 lb (154.677 kg)   Tech Comments:  N/A    Nuclear Med Study 1 or 2 day study: 1 day  Stress Test Type:  Lexiscan  Order Authorizing Provider:  Susa Griffins, MD   Resting Radionuclide: Technetium 22m Sestamibi  Resting Radionuclide Dose: 10.9 mCi   Stress Radionuclide:  Technetium 75m Sestamibi  Stress Radionuclide Dose: 30.8 mCi           Stress Protocol Rest HR: 57 Stress HR: 68  Rest BP: 136/73 Stress BP: 141/64  Exercise Time (min): n/a METS: n/a          Dose of Adenosine (mg):  n/a Dose of Lexiscan: 0.4 mg  Dose of Atropine (mg): n/a Dose of Dobutamine: n/a mcg/kg/min (at max HR)  Stress Test Technologist: Ernestene Mention, CCT Nuclear Technologist: Gonzella Lex, CNMT   Rest Procedure:  Myocardial perfusion imaging was performed at rest 45 minutes following the intravenous administration of Technetium 11m Sestamibi. Stress Procedure:  The patient received IV Lexiscan 0.4 mg over 15-seconds.  Technetium 1m Sestamibi injected at 30-seconds.  Due to patient experiencing shortness of breath during the infusion, he was given 75 mg of aminophylline through his IV. There were no significant changes with Lexiscan.  Quantitative spect images were obtained  after a 45 minute delay.  Transient Ischemic Dilatation (Normal <1.22):  0.99 Lung/Heart Ratio (Normal <0.45):  0.38 QGS EDV:  201 ml QGS ESV:  117 ml LV Ejection Fraction: 42%  Signed by      Rest ECG: NSR - Normal EKG  Stress ECG: No significant change from baseline ECG  QPS Raw Data Images:  Normal; no motion artifact; normal heart/lung ratio. Stress Images:  Normal homogeneous uptake with very mild apical thinning. Rest Images:  Normal homogeneous uptake with mild infero-apical thinning Subtraction (SDS):  No evidence of ischemia.  Impression Exercise Capacity:  Lexiscan with no exercise. BP Response:  Normal blood pressure response. Clinical Symptoms:  No significant symptoms noted. ECG Impression:  No significant ST segment change suggestive of ischemia. Comparison with Prior Nuclear Study: No previous nuclear study performed  Overall Impression:  Low risk stress nuclear study without evidence for ischemia.  LV Wall Motion:  Mild global LV dysfunction with EF 42%.   Lennette Bihari, MD  12/11/2012 6:28 PM

## 2012-12-11 NOTE — Progress Notes (Signed)
Carotid duplex completed 

## 2013-03-04 ENCOUNTER — Telehealth: Payer: Self-pay | Admitting: *Deleted

## 2013-03-04 NOTE — Telephone Encounter (Signed)
Noted Rx request sent to incorrect office, faxed to correct office manually addressed MD Osvaldo Shipper

## 2013-04-18 ENCOUNTER — Encounter: Payer: Self-pay | Admitting: Cardiovascular Disease

## 2013-10-30 ENCOUNTER — Ambulatory Visit: Payer: Medicare Other | Admitting: Cardiology

## 2015-07-16 ENCOUNTER — Encounter: Payer: Self-pay | Admitting: *Deleted

## 2015-08-21 ENCOUNTER — Encounter: Payer: Self-pay | Admitting: Cardiovascular Disease

## 2015-08-26 ENCOUNTER — Encounter: Payer: Self-pay | Admitting: Cardiovascular Disease

## 2016-03-08 DIAGNOSIS — I10 Essential (primary) hypertension: Secondary | ICD-10-CM | POA: Diagnosis not present

## 2016-03-08 DIAGNOSIS — Z0000 Encounter for general adult medical examination without abnormal findings: Secondary | ICD-10-CM | POA: Diagnosis not present

## 2016-03-08 DIAGNOSIS — H60392 Other infective otitis externa, left ear: Secondary | ICD-10-CM | POA: Diagnosis not present

## 2016-03-08 DIAGNOSIS — E1149 Type 2 diabetes mellitus with other diabetic neurological complication: Secondary | ICD-10-CM | POA: Diagnosis not present

## 2016-04-05 DIAGNOSIS — H60333 Swimmer's ear, bilateral: Secondary | ICD-10-CM | POA: Diagnosis not present

## 2016-04-11 DIAGNOSIS — T7840XA Allergy, unspecified, initial encounter: Secondary | ICD-10-CM | POA: Diagnosis not present

## 2016-06-14 DIAGNOSIS — E1142 Type 2 diabetes mellitus with diabetic polyneuropathy: Secondary | ICD-10-CM | POA: Diagnosis not present

## 2016-06-14 DIAGNOSIS — N182 Chronic kidney disease, stage 2 (mild): Secondary | ICD-10-CM | POA: Diagnosis not present

## 2016-06-14 DIAGNOSIS — I10 Essential (primary) hypertension: Secondary | ICD-10-CM | POA: Diagnosis not present

## 2016-06-14 DIAGNOSIS — Z125 Encounter for screening for malignant neoplasm of prostate: Secondary | ICD-10-CM | POA: Diagnosis not present

## 2016-06-14 DIAGNOSIS — M0589 Other rheumatoid arthritis with rheumatoid factor of multiple sites: Secondary | ICD-10-CM | POA: Diagnosis not present

## 2016-06-14 DIAGNOSIS — Z0000 Encounter for general adult medical examination without abnormal findings: Secondary | ICD-10-CM | POA: Diagnosis not present

## 2016-06-14 DIAGNOSIS — I872 Venous insufficiency (chronic) (peripheral): Secondary | ICD-10-CM | POA: Diagnosis not present

## 2016-06-14 DIAGNOSIS — Z1389 Encounter for screening for other disorder: Secondary | ICD-10-CM | POA: Diagnosis not present

## 2016-07-13 DIAGNOSIS — Z1329 Encounter for screening for other suspected endocrine disorder: Secondary | ICD-10-CM | POA: Diagnosis not present

## 2016-07-28 DIAGNOSIS — N182 Chronic kidney disease, stage 2 (mild): Secondary | ICD-10-CM | POA: Diagnosis not present

## 2016-07-28 DIAGNOSIS — M1049 Other secondary gout, multiple sites: Secondary | ICD-10-CM | POA: Diagnosis not present

## 2016-07-28 DIAGNOSIS — I10 Essential (primary) hypertension: Secondary | ICD-10-CM | POA: Diagnosis not present

## 2016-07-28 DIAGNOSIS — E1142 Type 2 diabetes mellitus with diabetic polyneuropathy: Secondary | ICD-10-CM | POA: Diagnosis not present

## 2016-07-28 DIAGNOSIS — M0589 Other rheumatoid arthritis with rheumatoid factor of multiple sites: Secondary | ICD-10-CM | POA: Diagnosis not present

## 2016-08-08 DIAGNOSIS — L570 Actinic keratosis: Secondary | ICD-10-CM | POA: Diagnosis not present

## 2016-08-08 DIAGNOSIS — L988 Other specified disorders of the skin and subcutaneous tissue: Secondary | ICD-10-CM | POA: Diagnosis not present

## 2016-08-26 DIAGNOSIS — E1142 Type 2 diabetes mellitus with diabetic polyneuropathy: Secondary | ICD-10-CM | POA: Diagnosis not present

## 2016-08-26 DIAGNOSIS — M0589 Other rheumatoid arthritis with rheumatoid factor of multiple sites: Secondary | ICD-10-CM | POA: Diagnosis not present

## 2016-08-26 DIAGNOSIS — I10 Essential (primary) hypertension: Secondary | ICD-10-CM | POA: Diagnosis not present

## 2016-08-26 DIAGNOSIS — M1049 Other secondary gout, multiple sites: Secondary | ICD-10-CM | POA: Diagnosis not present

## 2016-08-26 DIAGNOSIS — N182 Chronic kidney disease, stage 2 (mild): Secondary | ICD-10-CM | POA: Diagnosis not present

## 2016-10-03 DIAGNOSIS — E119 Type 2 diabetes mellitus without complications: Secondary | ICD-10-CM | POA: Diagnosis not present

## 2016-10-03 DIAGNOSIS — M1009 Idiopathic gout, multiple sites: Secondary | ICD-10-CM | POA: Diagnosis not present

## 2016-10-06 DIAGNOSIS — E113213 Type 2 diabetes mellitus with mild nonproliferative diabetic retinopathy with macular edema, bilateral: Secondary | ICD-10-CM | POA: Diagnosis not present

## 2016-10-06 DIAGNOSIS — Z961 Presence of intraocular lens: Secondary | ICD-10-CM | POA: Diagnosis not present

## 2016-10-06 DIAGNOSIS — H35371 Puckering of macula, right eye: Secondary | ICD-10-CM | POA: Diagnosis not present

## 2016-10-06 DIAGNOSIS — H26493 Other secondary cataract, bilateral: Secondary | ICD-10-CM | POA: Diagnosis not present

## 2016-10-10 DIAGNOSIS — H35373 Puckering of macula, bilateral: Secondary | ICD-10-CM | POA: Diagnosis not present

## 2016-10-10 DIAGNOSIS — H43812 Vitreous degeneration, left eye: Secondary | ICD-10-CM | POA: Diagnosis not present

## 2016-10-10 DIAGNOSIS — H26493 Other secondary cataract, bilateral: Secondary | ICD-10-CM | POA: Diagnosis not present

## 2016-10-10 DIAGNOSIS — H4321 Crystalline deposits in vitreous body, right eye: Secondary | ICD-10-CM | POA: Diagnosis not present

## 2016-10-24 DIAGNOSIS — E1142 Type 2 diabetes mellitus with diabetic polyneuropathy: Secondary | ICD-10-CM | POA: Diagnosis not present

## 2016-10-24 DIAGNOSIS — I10 Essential (primary) hypertension: Secondary | ICD-10-CM | POA: Diagnosis not present

## 2016-10-24 DIAGNOSIS — N182 Chronic kidney disease, stage 2 (mild): Secondary | ICD-10-CM | POA: Diagnosis not present

## 2016-10-24 DIAGNOSIS — M0589 Other rheumatoid arthritis with rheumatoid factor of multiple sites: Secondary | ICD-10-CM | POA: Diagnosis not present

## 2016-10-24 DIAGNOSIS — M1049 Other secondary gout, multiple sites: Secondary | ICD-10-CM | POA: Diagnosis not present

## 2016-11-15 DIAGNOSIS — N182 Chronic kidney disease, stage 2 (mild): Secondary | ICD-10-CM | POA: Diagnosis not present

## 2016-11-15 DIAGNOSIS — E1142 Type 2 diabetes mellitus with diabetic polyneuropathy: Secondary | ICD-10-CM | POA: Diagnosis not present

## 2016-11-15 DIAGNOSIS — M0589 Other rheumatoid arthritis with rheumatoid factor of multiple sites: Secondary | ICD-10-CM | POA: Diagnosis not present

## 2016-11-15 DIAGNOSIS — M1049 Other secondary gout, multiple sites: Secondary | ICD-10-CM | POA: Diagnosis not present

## 2016-11-15 DIAGNOSIS — I10 Essential (primary) hypertension: Secondary | ICD-10-CM | POA: Diagnosis not present

## 2016-12-01 DIAGNOSIS — H26493 Other secondary cataract, bilateral: Secondary | ICD-10-CM | POA: Diagnosis not present

## 2016-12-01 DIAGNOSIS — Z961 Presence of intraocular lens: Secondary | ICD-10-CM | POA: Diagnosis not present

## 2016-12-08 DIAGNOSIS — I10 Essential (primary) hypertension: Secondary | ICD-10-CM | POA: Diagnosis not present

## 2016-12-08 DIAGNOSIS — E1142 Type 2 diabetes mellitus with diabetic polyneuropathy: Secondary | ICD-10-CM | POA: Diagnosis not present

## 2016-12-08 DIAGNOSIS — M1049 Other secondary gout, multiple sites: Secondary | ICD-10-CM | POA: Diagnosis not present

## 2016-12-08 DIAGNOSIS — N182 Chronic kidney disease, stage 2 (mild): Secondary | ICD-10-CM | POA: Diagnosis not present

## 2016-12-08 DIAGNOSIS — M0589 Other rheumatoid arthritis with rheumatoid factor of multiple sites: Secondary | ICD-10-CM | POA: Diagnosis not present

## 2016-12-14 DIAGNOSIS — H26492 Other secondary cataract, left eye: Secondary | ICD-10-CM | POA: Diagnosis not present

## 2016-12-28 DIAGNOSIS — H26491 Other secondary cataract, right eye: Secondary | ICD-10-CM | POA: Diagnosis not present

## 2017-01-05 DIAGNOSIS — E1142 Type 2 diabetes mellitus with diabetic polyneuropathy: Secondary | ICD-10-CM | POA: Diagnosis not present

## 2017-01-05 DIAGNOSIS — M0589 Other rheumatoid arthritis with rheumatoid factor of multiple sites: Secondary | ICD-10-CM | POA: Diagnosis not present

## 2017-01-05 DIAGNOSIS — I10 Essential (primary) hypertension: Secondary | ICD-10-CM | POA: Diagnosis not present

## 2017-01-05 DIAGNOSIS — N182 Chronic kidney disease, stage 2 (mild): Secondary | ICD-10-CM | POA: Diagnosis not present

## 2017-01-05 DIAGNOSIS — M1049 Other secondary gout, multiple sites: Secondary | ICD-10-CM | POA: Diagnosis not present

## 2017-01-09 DIAGNOSIS — E1121 Type 2 diabetes mellitus with diabetic nephropathy: Secondary | ICD-10-CM | POA: Diagnosis not present

## 2017-01-09 DIAGNOSIS — I82491 Acute embolism and thrombosis of other specified deep vein of right lower extremity: Secondary | ICD-10-CM | POA: Diagnosis not present

## 2017-01-09 DIAGNOSIS — Z79899 Other long term (current) drug therapy: Secondary | ICD-10-CM | POA: Diagnosis not present

## 2017-01-09 DIAGNOSIS — M1009 Idiopathic gout, multiple sites: Secondary | ICD-10-CM | POA: Diagnosis not present

## 2017-01-19 DIAGNOSIS — R0600 Dyspnea, unspecified: Secondary | ICD-10-CM | POA: Diagnosis not present

## 2017-01-19 DIAGNOSIS — Z79899 Other long term (current) drug therapy: Secondary | ICD-10-CM | POA: Diagnosis not present

## 2017-01-19 DIAGNOSIS — M25559 Pain in unspecified hip: Secondary | ICD-10-CM | POA: Diagnosis not present

## 2017-01-19 DIAGNOSIS — Z9181 History of falling: Secondary | ICD-10-CM | POA: Diagnosis not present

## 2017-01-19 DIAGNOSIS — R002 Palpitations: Secondary | ICD-10-CM | POA: Diagnosis not present

## 2017-01-19 DIAGNOSIS — Z8249 Family history of ischemic heart disease and other diseases of the circulatory system: Secondary | ICD-10-CM | POA: Diagnosis not present

## 2017-01-19 DIAGNOSIS — E1122 Type 2 diabetes mellitus with diabetic chronic kidney disease: Secondary | ICD-10-CM | POA: Diagnosis not present

## 2017-01-19 DIAGNOSIS — I5020 Unspecified systolic (congestive) heart failure: Secondary | ICD-10-CM | POA: Diagnosis not present

## 2017-01-19 DIAGNOSIS — I130 Hypertensive heart and chronic kidney disease with heart failure and stage 1 through stage 4 chronic kidney disease, or unspecified chronic kidney disease: Secondary | ICD-10-CM | POA: Diagnosis not present

## 2017-01-19 DIAGNOSIS — Z8679 Personal history of other diseases of the circulatory system: Secondary | ICD-10-CM | POA: Diagnosis not present

## 2017-01-19 DIAGNOSIS — I10 Essential (primary) hypertension: Secondary | ICD-10-CM | POA: Diagnosis not present

## 2017-01-19 DIAGNOSIS — R0609 Other forms of dyspnea: Secondary | ICD-10-CM | POA: Diagnosis not present

## 2017-01-19 DIAGNOSIS — Z87891 Personal history of nicotine dependence: Secondary | ICD-10-CM | POA: Diagnosis not present

## 2017-02-01 DIAGNOSIS — M0589 Other rheumatoid arthritis with rheumatoid factor of multiple sites: Secondary | ICD-10-CM | POA: Diagnosis not present

## 2017-02-01 DIAGNOSIS — I10 Essential (primary) hypertension: Secondary | ICD-10-CM | POA: Diagnosis not present

## 2017-02-01 DIAGNOSIS — E1121 Type 2 diabetes mellitus with diabetic nephropathy: Secondary | ICD-10-CM | POA: Diagnosis not present

## 2017-02-01 DIAGNOSIS — I517 Cardiomegaly: Secondary | ICD-10-CM | POA: Diagnosis not present

## 2017-02-01 DIAGNOSIS — I5189 Other ill-defined heart diseases: Secondary | ICD-10-CM | POA: Diagnosis not present

## 2017-02-01 DIAGNOSIS — M1009 Idiopathic gout, multiple sites: Secondary | ICD-10-CM | POA: Diagnosis not present

## 2017-02-02 DIAGNOSIS — R002 Palpitations: Secondary | ICD-10-CM | POA: Diagnosis not present

## 2017-02-13 ENCOUNTER — Other Ambulatory Visit (HOSPITAL_COMMUNITY): Payer: Self-pay | Admitting: Family Medicine

## 2017-02-13 DIAGNOSIS — N200 Calculus of kidney: Secondary | ICD-10-CM

## 2017-02-15 ENCOUNTER — Ambulatory Visit (HOSPITAL_COMMUNITY): Payer: Medicare Other

## 2017-02-15 ENCOUNTER — Ambulatory Visit (HOSPITAL_COMMUNITY)
Admission: RE | Admit: 2017-02-15 | Discharge: 2017-02-15 | Disposition: A | Discharge status: Home / Self Care | Payer: Medicare Other | Source: Ambulatory Visit | Attending: Family Medicine | Admitting: Family Medicine

## 2017-02-15 DIAGNOSIS — N200 Calculus of kidney: Secondary | ICD-10-CM | POA: Diagnosis not present

## 2017-02-15 DIAGNOSIS — Z9049 Acquired absence of other specified parts of digestive tract: Secondary | ICD-10-CM | POA: Insufficient documentation

## 2017-02-15 DIAGNOSIS — M47896 Other spondylosis, lumbar region: Secondary | ICD-10-CM | POA: Diagnosis not present

## 2017-02-15 DIAGNOSIS — R109 Unspecified abdominal pain: Secondary | ICD-10-CM | POA: Diagnosis not present

## 2017-02-15 DIAGNOSIS — N261 Atrophy of kidney (terminal): Secondary | ICD-10-CM | POA: Insufficient documentation

## 2017-03-01 DIAGNOSIS — M0589 Other rheumatoid arthritis with rheumatoid factor of multiple sites: Secondary | ICD-10-CM | POA: Diagnosis not present

## 2017-03-01 DIAGNOSIS — E1121 Type 2 diabetes mellitus with diabetic nephropathy: Secondary | ICD-10-CM | POA: Diagnosis not present

## 2017-03-01 DIAGNOSIS — M1009 Idiopathic gout, multiple sites: Secondary | ICD-10-CM | POA: Diagnosis not present

## 2017-03-01 DIAGNOSIS — I10 Essential (primary) hypertension: Secondary | ICD-10-CM | POA: Diagnosis not present

## 2017-03-16 ENCOUNTER — Telehealth: Payer: Self-pay

## 2017-03-16 NOTE — Telephone Encounter (Signed)
Pt was referred from [facility] for colonoscopy. He is on coumadin and I have scheduled him an appt on 04/10/2017 at 10:30 Am with Tana Coast, PA.  I have called and told Phyllis Ginger @ [phone].  She will fax his authorization number.

## 2017-03-20 NOTE — Telephone Encounter (Signed)
Authorization number is [id number].  Darl Pikes will scan in also.

## 2017-04-05 DIAGNOSIS — M0589 Other rheumatoid arthritis with rheumatoid factor of multiple sites: Secondary | ICD-10-CM | POA: Diagnosis not present

## 2017-04-05 DIAGNOSIS — E1121 Type 2 diabetes mellitus with diabetic nephropathy: Secondary | ICD-10-CM | POA: Diagnosis not present

## 2017-04-05 DIAGNOSIS — I10 Essential (primary) hypertension: Secondary | ICD-10-CM | POA: Diagnosis not present

## 2017-04-05 DIAGNOSIS — M1009 Idiopathic gout, multiple sites: Secondary | ICD-10-CM | POA: Diagnosis not present

## 2017-04-06 DIAGNOSIS — H5203 Hypermetropia, bilateral: Secondary | ICD-10-CM | POA: Diagnosis not present

## 2017-04-06 DIAGNOSIS — H35371 Puckering of macula, right eye: Secondary | ICD-10-CM | POA: Diagnosis not present

## 2017-04-06 DIAGNOSIS — H524 Presbyopia: Secondary | ICD-10-CM | POA: Diagnosis not present

## 2017-04-06 DIAGNOSIS — H52221 Regular astigmatism, right eye: Secondary | ICD-10-CM | POA: Diagnosis not present

## 2017-04-10 ENCOUNTER — Encounter: Payer: Self-pay | Admitting: Gastroenterology

## 2017-04-10 ENCOUNTER — Ambulatory Visit (INDEPENDENT_AMBULATORY_CARE_PROVIDER_SITE_OTHER): Payer: Non-veteran care | Admitting: Gastroenterology

## 2017-04-10 DIAGNOSIS — Z860100 Personal history of colon polyps, unspecified: Secondary | ICD-10-CM | POA: Insufficient documentation

## 2017-04-10 DIAGNOSIS — Z7901 Long term (current) use of anticoagulants: Secondary | ICD-10-CM

## 2017-04-10 HISTORY — DX: Personal history of colon polyps, unspecified: Z86.0100

## 2017-04-10 HISTORY — DX: Personal history of colonic polyps: Z86.010

## 2017-04-10 NOTE — Patient Instructions (Signed)
1. We will contact you to schedule the colonoscopy once we have information from Dr. Olena Leatherwood regarding your coumadin.

## 2017-04-10 NOTE — Assessment & Plan Note (Signed)
66 year old gentleman with history of precancerous colon polyps, last colonoscopy reported to be in 2011 and was due for repeat in 2016 who presents to consider colonoscopy. He is on chronic Coumadin for history of DVT/PE remotely. Coumadin is managed by Dr. Olena Leatherwood. Patient has had somewhat looser bowels since the past 3 years. Call bladder removed in 2013. He has episodes of vomiting lasting for 2-3 days at a time about 3 times per year and was diagnosed with mild gastroparesis on prior GES several years back. He has been on chronic Reglan ever since. No noted side effects to date.  We will discuss holding Coumadin with Dr. Olena Leatherwood vs lovenox bridge. Once we have this information we will pursue colonoscopy.

## 2017-04-10 NOTE — Progress Notes (Addendum)
REVIEWED. PT TOO HIGH RISK FOR ANESTHESIA AT [facility]. REFER TO [facility] FOR SCREENING TCS.  Primary Care Physician:  Toma Deiters, MD  Primary Gastroenterologist:    Chief Complaint  Patient presents with  . Colonoscopy    HPI:  Mitchell Parker. is a 66 y.o. male here For screening colonoscopy. Referral came through the [facility].  Patient has previously seen Dr. Madilyn Fireman in the remote past for gastroparesis. Has been on Reglan 4 times daily and about 3 times per year has flare where he has vomiting for 2-3 days. During these times he doubles up on his Reglan but states it doesn't really help. Has to run its course. Abdominal soreness from heaving but no other symptoms. No hematemesis.  He has a bowel movement every other day, [place] 6. For the past couple of years stools have been more loose. Has gallbladder out a few years back. No melena rectal bleeding. 13 pound intentional weight loss in the past 3 weeks on weight watchers. No heartburn. No longer on PPI. He has sleep apnea and uses CPAP.  CT abdomen pelvis without contrast 02/16/2017 with stable ventral hernia repair, status post cholecystectomy, stable bilateral renal atrophy.  Last colonoscopy 2011, was due in 2016 for h/o colon polyps. No prior EGD.   Current Outpatient Prescriptions  Medication Sig Dispense Refill  . albuterol-ipratropium (COMBIVENT) 18-103 MCG/ACT inhaler Inhale 2 puffs into the lungs every 6 (six) hours as needed. Wheezing    . allopurinol (ZYLOPRIM) 300 MG tablet Take 300 mg by mouth every morning.    [name] aspirin 81 MG tablet Take 1 tablet (81 mg total) by mouth daily. 90 tablet 3  . atorvastatin (LIPITOR) 80 MG tablet Take 80 mg by mouth.    . calcitRIOL (ROCALTROL) 0.25 MCG capsule Take 0.25 mcg by mouth daily with breakfast.     . carvedilol (COREG) 12.5 MG tablet Take 1 tablet (12.5 mg total) by mouth 2 (two) times daily with a meal. 30 tablet 0  . Colchicine 0.6 MG CAPS Take 0.6 mg by mouth daily.     . DULoxetine HCl (CYMBALTA PO) Take by mouth.    . furosemide (LASIX) 80 MG tablet Take 80 mg by mouth 2 (two) times daily.      [name] gabapentin (NEURONTIN) 300 MG capsule Take 300 mg by mouth 2 (two) times daily.      [name] HYDROXYCHLOROQUINE SULFATE PO Take 300 mg by mouth 2 (two) times daily.    . insulin aspart (NOVOLOG) 100 UNIT/ML injection Inject 50 Units into the skin 3 (three) times daily before meals.    . insulin NPH (HUMULIN N,NOVOLIN N) 100 UNIT/ML injection Inject 90 Units into the skin 2 (two) times daily.     [name] lisinopril (PRINIVIL,ZESTRIL) 10 MG tablet Take 1 tablet (10 mg total) by mouth daily. 30 tablet 3  . metoCLOPramide (REGLAN) 5 MG tablet Take 1 tablet (5 mg total) by mouth 4 (four) times daily. 65 tablet 0  . Multiple Vitamin (MULTIVITAMIN) capsule Take 1 capsule by mouth daily.    [name] warfarin (COUMADIN) 2.5 MG tablet Take 2.5 mg by mouth daily. Taken with 5mg  tab to equal 7.5mg  daily.    warfarin (COUMADIN) 5 MG tablet Take 5 mg by mouth daily. Taken with 2.5mg  daily to equal 7.5mg .     No current facility-administered medications for this visit.     Allergies as of 04/10/2017 - Review Complete 04/10/2017  Allergen Reaction Noted  .  Ciprofloxacin Hives 10/26/2011  . Levofloxacin [levofloxacin] Hives 10/26/2011    Past Medical History:  Diagnosis Date  . CHF (congestive heart failure) (HCC)    H/o nonischemic CMP with EF 17% in 2003, however echo 09/2011 with EF 60-65%. Not ischemic by cath, no MI, stents, etc  . CKD (chronic kidney disease) stage 3, GFR 30-59 ml/min   . COPD (chronic obstructive pulmonary disease) (HCC)   . Diabetes mellitus    On insulin  . DVT (deep venous thrombosis) (HCC)   . Fatigue   . Gallstones   . Gout   . Hypertension   . Kidney stones   . Lumbar disc disease    compression   . Neuropathy   . Obese   . Piriformis syndrome    left hip  . Pulmonary emboli (HCC)   . RA (rheumatoid arthritis) (HCC)   . Sleep apnea     Past  Surgical History:  Procedure Laterality Date  . cardiology nuclear med study  2013   eval for ischemia  . CHOLECYSTECTOMY  01/18/2012   Procedure: LAPAROSCOPIC CHOLECYSTECTOMY WITH INTRAOPERATIVE CHOLANGIOGRAM;  Surgeon: Maisie Fus A. Cornett, MD;  Location: [facility];  Service: General;  Laterality: N/A;  . PILONIDAL CYST EXCISION  1997  . UMBILICAL HERNIA REPAIR    . VARICOSE VEIN SURGERY     left leg    Family History  Problem Relation Age of Onset  . Hypertension Mother   . Diabetes Father   . Diabetes Brother   . Colon cancer Neg Hx     Social History   Social History  . Marital status: Married    Spouse name: N/A  . Number of children: N/A  . Years of education: N/A   Occupational History  . Not on file.   Social History Main Topics  . Smoking status: Former Smoker    Quit date: 12/26/1994  . Smokeless tobacco: Never Used  . Alcohol use No  . Drug use: No  . Sexual activity: Not on file   Other Topics Concern  . Not on file   Social History Narrative   Lives at home with wife, and has two adult children. Still ambulatory, no cane or walker.       ROS:  General: Negative for anorexia, unintentional weight loss, fever, chills, fatigue, weakness. Eyes: Negative for vision changes.  ENT: Negative for hoarseness, difficulty swallowing , nasal congestion. CV: Negative for chest pain, angina, palpitations, dyspnea on exertion, +peripheral edema.  Respiratory: Negative for dyspnea at rest, dyspnea on exertion, cough, sputum, wheezing.  GI: See history of present illness. GU:  Negative for dysuria, hematuria, urinary incontinence, urinary frequency, nocturnal urination.  MS: Negative for low back pain. +hip pain with ambulation (chronic) Derm: Negative for rash or itching.  Neuro: Negative for weakness, abnormal sensation, seizure, frequent headaches, memory loss, confusion.  Psych: Negative for anxiety, depression, suicidal ideation, hallucinations.  Endo: Negative for  unusual weight change.  Heme: Negative for bruising or bleeding. Allergy: Negative for rash or hives.    Physical Examination:  BP 139/72   Pulse 63   Temp 97.9 F (36.6 C) (Oral)   Ht 6' (1.829 m)   Wt (!) 369 lb 12.8 oz (167.7 kg)   BMI 50.15 kg/m    General: Well-nourished, well-developed morbidly obese, in no acute distress.  Head: Normocephalic, atraumatic.   Eyes: Conjunctiva pink, no icterus. Mouth: Oropharyngeal mucosa moist and pink , no lesions erythema or exudate. Neck: Supple without thyromegaly, masses,  or lymphadenopathy.  Lungs: Clear to auscultation bilaterally.  Heart: Regular rate and rhythm, no murmurs rubs or gallops.  Abdomen: Bowel sounds are normal, nontender, nondistended, no hepatosplenomegaly or masses, no abdominal bruits or    hernia , no rebound or guarding.  Exam limited due to body habitus.  Rectal: not performed Extremities: chronic venous status changes, 1+lower extremity edema. No clubbing or deformities.  Neuro: Alert and oriented x 4 , grossly normal neurologically.  Skin: Warm and dry, no rash or jaundice.   Psych: Alert and cooperative, normal mood and affect.  Labs:Labs done 01/2017 at outside hospital Amylase 75, lipase 143 both normal, hemoglobin A1c 5.8, white blood cell count 4850, hemoglobin 13.6, hematocrit 42.7, platelets 164,000 BUN 16, creatinine 1.33, hepatitis C antibody negative  Imaging Studies: No results found.

## 2017-04-10 NOTE — Progress Notes (Signed)
CC'D TO PCP °

## 2017-04-11 ENCOUNTER — Telehealth: Payer: Self-pay

## 2017-04-11 NOTE — Telephone Encounter (Signed)
I spoke to pt and told him that Dr. Darrick Penna felt he was too high risk for anesthesia at [facility]. He said he just had gall bladder surgery recently at [facility] and had no problems. He was sent here by the [facility] and he does not feel they will pay for him to go elsewhere. Please advise!

## 2017-04-12 NOTE — Telephone Encounter (Signed)
PLEASE CALL PT. I ALREADY DISCUSSED WITH ANESTHESIA AT [facility]. HE IS TOO HIGH RISK. HE CAN BEEN SEEN BY [facility] SO THEY CAN ASSESS HIM IN THE OFFICE IF HE WANTS TO GO TO [place].

## 2017-04-12 NOTE — Telephone Encounter (Signed)
As noted, Dr. Darrick Penna already discussed with [facility] anesthesia. Agree with recommendations provided.

## 2017-04-12 NOTE — Telephone Encounter (Signed)
I will discuss further with RMR/SLF. Await further input.

## 2017-04-12 NOTE — Telephone Encounter (Signed)
Routing to [place] to f/u with [facility]

## 2017-04-13 ENCOUNTER — Telehealth: Payer: Self-pay | Admitting: Internal Medicine

## 2017-04-13 NOTE — Telephone Encounter (Signed)
Referral received for patient. Patient was just seen 4.16.18 at [facility], but Dr.Fields feels that pt needs to be seen here at [facility]. Patient is being referred for colon. DOD for 4.19.18 Dr.Perry

## 2017-04-13 NOTE — Telephone Encounter (Signed)
I tried to contact [facility],however I was unable to reach them

## 2017-04-13 NOTE — Telephone Encounter (Signed)
[facility] called to see why we could not do [facility]. I told them what SLF said. He then asked for [facility]'s number and I gave it to him.

## 2017-04-13 NOTE — Telephone Encounter (Signed)
It appears that the [place] is reaching out to [facility] to refer the patient.

## 2017-04-13 NOTE — Telephone Encounter (Signed)
I tried to call the patient, no answer,lmom 

## 2017-04-14 NOTE — Telephone Encounter (Signed)
Noted  

## 2017-04-18 DIAGNOSIS — Z1389 Encounter for screening for other disorder: Secondary | ICD-10-CM | POA: Diagnosis not present

## 2017-04-18 DIAGNOSIS — M1009 Idiopathic gout, multiple sites: Secondary | ICD-10-CM | POA: Diagnosis not present

## 2017-04-18 DIAGNOSIS — Z0000 Encounter for general adult medical examination without abnormal findings: Secondary | ICD-10-CM | POA: Diagnosis not present

## 2017-04-18 DIAGNOSIS — E1121 Type 2 diabetes mellitus with diabetic nephropathy: Secondary | ICD-10-CM | POA: Diagnosis not present

## 2017-04-18 DIAGNOSIS — I82491 Acute embolism and thrombosis of other specified deep vein of right lower extremity: Secondary | ICD-10-CM | POA: Diagnosis not present

## 2017-04-21 NOTE — Telephone Encounter (Signed)
Dr.Perry reviewed records and declined to accept patient. States he cannot accommodate.

## 2017-04-26 DIAGNOSIS — E1142 Type 2 diabetes mellitus with diabetic polyneuropathy: Secondary | ICD-10-CM | POA: Diagnosis not present

## 2017-04-26 DIAGNOSIS — N182 Chronic kidney disease, stage 2 (mild): Secondary | ICD-10-CM | POA: Diagnosis not present

## 2017-04-26 DIAGNOSIS — M0589 Other rheumatoid arthritis with rheumatoid factor of multiple sites: Secondary | ICD-10-CM | POA: Diagnosis not present

## 2017-04-26 DIAGNOSIS — I10 Essential (primary) hypertension: Secondary | ICD-10-CM | POA: Diagnosis not present

## 2017-05-26 HISTORY — PX: COLONOSCOPY: SHX174

## 2017-06-05 DIAGNOSIS — G4733 Obstructive sleep apnea (adult) (pediatric): Secondary | ICD-10-CM | POA: Diagnosis not present

## 2017-06-12 DIAGNOSIS — E1142 Type 2 diabetes mellitus with diabetic polyneuropathy: Secondary | ICD-10-CM | POA: Diagnosis not present

## 2017-06-12 DIAGNOSIS — N182 Chronic kidney disease, stage 2 (mild): Secondary | ICD-10-CM | POA: Diagnosis not present

## 2017-06-12 DIAGNOSIS — I10 Essential (primary) hypertension: Secondary | ICD-10-CM | POA: Diagnosis not present

## 2017-06-12 DIAGNOSIS — M0589 Other rheumatoid arthritis with rheumatoid factor of multiple sites: Secondary | ICD-10-CM | POA: Diagnosis not present

## 2017-07-19 DIAGNOSIS — N182 Chronic kidney disease, stage 2 (mild): Secondary | ICD-10-CM | POA: Diagnosis not present

## 2017-07-19 DIAGNOSIS — E1142 Type 2 diabetes mellitus with diabetic polyneuropathy: Secondary | ICD-10-CM | POA: Diagnosis not present

## 2017-07-19 DIAGNOSIS — I10 Essential (primary) hypertension: Secondary | ICD-10-CM | POA: Diagnosis not present

## 2017-07-19 DIAGNOSIS — M0589 Other rheumatoid arthritis with rheumatoid factor of multiple sites: Secondary | ICD-10-CM | POA: Diagnosis not present

## 2017-07-25 DIAGNOSIS — I82491 Acute embolism and thrombosis of other specified deep vein of right lower extremity: Secondary | ICD-10-CM | POA: Diagnosis not present

## 2017-07-25 DIAGNOSIS — M1009 Idiopathic gout, multiple sites: Secondary | ICD-10-CM | POA: Diagnosis not present

## 2017-07-25 DIAGNOSIS — L570 Actinic keratosis: Secondary | ICD-10-CM | POA: Diagnosis not present

## 2017-07-25 DIAGNOSIS — Z7901 Long term (current) use of anticoagulants: Secondary | ICD-10-CM | POA: Diagnosis not present

## 2017-07-25 DIAGNOSIS — I10 Essential (primary) hypertension: Secondary | ICD-10-CM | POA: Diagnosis not present

## 2017-07-25 DIAGNOSIS — E1121 Type 2 diabetes mellitus with diabetic nephropathy: Secondary | ICD-10-CM | POA: Diagnosis not present

## 2017-07-25 DIAGNOSIS — N182 Chronic kidney disease, stage 2 (mild): Secondary | ICD-10-CM | POA: Diagnosis not present

## 2017-08-05 DIAGNOSIS — S90821A Blister (nonthermal), right foot, initial encounter: Secondary | ICD-10-CM | POA: Diagnosis not present

## 2017-08-05 DIAGNOSIS — S81811A Laceration without foreign body, right lower leg, initial encounter: Secondary | ICD-10-CM | POA: Diagnosis not present

## 2017-08-05 DIAGNOSIS — J449 Chronic obstructive pulmonary disease, unspecified: Secondary | ICD-10-CM | POA: Diagnosis not present

## 2017-08-05 DIAGNOSIS — M79674 Pain in right toe(s): Secondary | ICD-10-CM | POA: Diagnosis not present

## 2017-08-05 DIAGNOSIS — I509 Heart failure, unspecified: Secondary | ICD-10-CM | POA: Diagnosis not present

## 2017-08-05 DIAGNOSIS — Z7982 Long term (current) use of aspirin: Secondary | ICD-10-CM | POA: Diagnosis not present

## 2017-08-05 DIAGNOSIS — Z794 Long term (current) use of insulin: Secondary | ICD-10-CM | POA: Diagnosis not present

## 2017-08-05 DIAGNOSIS — E119 Type 2 diabetes mellitus without complications: Secondary | ICD-10-CM | POA: Diagnosis not present

## 2017-08-05 DIAGNOSIS — I110 Hypertensive heart disease with heart failure: Secondary | ICD-10-CM | POA: Diagnosis not present

## 2017-08-05 DIAGNOSIS — M109 Gout, unspecified: Secondary | ICD-10-CM | POA: Diagnosis not present

## 2017-08-05 DIAGNOSIS — M009 Pyogenic arthritis, unspecified: Secondary | ICD-10-CM | POA: Diagnosis not present

## 2017-08-05 DIAGNOSIS — Z79899 Other long term (current) drug therapy: Secondary | ICD-10-CM | POA: Diagnosis not present

## 2017-08-05 DIAGNOSIS — M7989 Other specified soft tissue disorders: Secondary | ICD-10-CM | POA: Diagnosis not present

## 2017-08-05 DIAGNOSIS — L03031 Cellulitis of right toe: Secondary | ICD-10-CM | POA: Diagnosis not present

## 2017-08-08 DIAGNOSIS — L03031 Cellulitis of right toe: Secondary | ICD-10-CM | POA: Diagnosis not present

## 2017-08-09 DIAGNOSIS — N182 Chronic kidney disease, stage 2 (mild): Secondary | ICD-10-CM | POA: Diagnosis not present

## 2017-08-09 DIAGNOSIS — M1049 Other secondary gout, multiple sites: Secondary | ICD-10-CM | POA: Diagnosis not present

## 2017-08-09 DIAGNOSIS — E1142 Type 2 diabetes mellitus with diabetic polyneuropathy: Secondary | ICD-10-CM | POA: Diagnosis not present

## 2017-08-09 DIAGNOSIS — I10 Essential (primary) hypertension: Secondary | ICD-10-CM | POA: Diagnosis not present

## 2017-08-09 DIAGNOSIS — M0589 Other rheumatoid arthritis with rheumatoid factor of multiple sites: Secondary | ICD-10-CM | POA: Diagnosis not present

## 2017-08-17 DIAGNOSIS — E1122 Type 2 diabetes mellitus with diabetic chronic kidney disease: Secondary | ICD-10-CM | POA: Diagnosis not present

## 2017-08-17 DIAGNOSIS — Z7901 Long term (current) use of anticoagulants: Secondary | ICD-10-CM | POA: Diagnosis not present

## 2017-08-17 DIAGNOSIS — N184 Chronic kidney disease, stage 4 (severe): Secondary | ICD-10-CM | POA: Diagnosis not present

## 2017-08-17 DIAGNOSIS — R9431 Abnormal electrocardiogram [ECG] [EKG]: Secondary | ICD-10-CM | POA: Diagnosis not present

## 2017-08-17 DIAGNOSIS — Z87891 Personal history of nicotine dependence: Secondary | ICD-10-CM | POA: Diagnosis not present

## 2017-08-17 DIAGNOSIS — M25559 Pain in unspecified hip: Secondary | ICD-10-CM | POA: Diagnosis not present

## 2017-08-17 DIAGNOSIS — I130 Hypertensive heart and chronic kidney disease with heart failure and stage 1 through stage 4 chronic kidney disease, or unspecified chronic kidney disease: Secondary | ICD-10-CM | POA: Diagnosis not present

## 2017-08-17 DIAGNOSIS — Z7982 Long term (current) use of aspirin: Secondary | ICD-10-CM | POA: Diagnosis not present

## 2017-08-17 DIAGNOSIS — I5032 Chronic diastolic (congestive) heart failure: Secondary | ICD-10-CM | POA: Insufficient documentation

## 2017-08-17 DIAGNOSIS — E785 Hyperlipidemia, unspecified: Secondary | ICD-10-CM | POA: Diagnosis not present

## 2017-08-17 DIAGNOSIS — Z881 Allergy status to other antibiotic agents status: Secondary | ICD-10-CM | POA: Diagnosis not present

## 2017-08-17 DIAGNOSIS — I5030 Unspecified diastolic (congestive) heart failure: Secondary | ICD-10-CM | POA: Diagnosis not present

## 2017-08-17 DIAGNOSIS — I4510 Unspecified right bundle-branch block: Secondary | ICD-10-CM | POA: Diagnosis not present

## 2017-08-17 DIAGNOSIS — E7800 Pure hypercholesterolemia, unspecified: Secondary | ICD-10-CM | POA: Diagnosis not present

## 2017-08-17 DIAGNOSIS — Z86718 Personal history of other venous thrombosis and embolism: Secondary | ICD-10-CM | POA: Diagnosis not present

## 2017-08-17 DIAGNOSIS — Z79899 Other long term (current) drug therapy: Secondary | ICD-10-CM | POA: Diagnosis not present

## 2017-08-17 DIAGNOSIS — I5042 Chronic combined systolic (congestive) and diastolic (congestive) heart failure: Secondary | ICD-10-CM | POA: Diagnosis not present

## 2017-08-17 DIAGNOSIS — Z794 Long term (current) use of insulin: Secondary | ICD-10-CM | POA: Diagnosis not present

## 2017-08-17 DIAGNOSIS — E119 Type 2 diabetes mellitus without complications: Secondary | ICD-10-CM | POA: Diagnosis not present

## 2017-08-23 DIAGNOSIS — Z7901 Long term (current) use of anticoagulants: Secondary | ICD-10-CM | POA: Diagnosis not present

## 2017-09-11 DIAGNOSIS — E1142 Type 2 diabetes mellitus with diabetic polyneuropathy: Secondary | ICD-10-CM | POA: Diagnosis not present

## 2017-09-11 DIAGNOSIS — I10 Essential (primary) hypertension: Secondary | ICD-10-CM | POA: Diagnosis not present

## 2017-09-11 DIAGNOSIS — M1049 Other secondary gout, multiple sites: Secondary | ICD-10-CM | POA: Diagnosis not present

## 2017-09-11 DIAGNOSIS — M0589 Other rheumatoid arthritis with rheumatoid factor of multiple sites: Secondary | ICD-10-CM | POA: Diagnosis not present

## 2017-09-11 DIAGNOSIS — N182 Chronic kidney disease, stage 2 (mild): Secondary | ICD-10-CM | POA: Diagnosis not present

## 2017-10-23 DIAGNOSIS — I10 Essential (primary) hypertension: Secondary | ICD-10-CM | POA: Diagnosis not present

## 2017-10-23 DIAGNOSIS — E1121 Type 2 diabetes mellitus with diabetic nephropathy: Secondary | ICD-10-CM | POA: Diagnosis not present

## 2017-10-30 DIAGNOSIS — I82491 Acute embolism and thrombosis of other specified deep vein of right lower extremity: Secondary | ICD-10-CM | POA: Diagnosis not present

## 2017-10-30 DIAGNOSIS — E1121 Type 2 diabetes mellitus with diabetic nephropathy: Secondary | ICD-10-CM | POA: Diagnosis not present

## 2017-10-30 DIAGNOSIS — N401 Enlarged prostate with lower urinary tract symptoms: Secondary | ICD-10-CM | POA: Diagnosis not present

## 2017-10-30 DIAGNOSIS — I10 Essential (primary) hypertension: Secondary | ICD-10-CM | POA: Diagnosis not present

## 2017-10-31 DIAGNOSIS — I10 Essential (primary) hypertension: Secondary | ICD-10-CM | POA: Diagnosis not present

## 2017-10-31 DIAGNOSIS — N182 Chronic kidney disease, stage 2 (mild): Secondary | ICD-10-CM | POA: Diagnosis not present

## 2017-10-31 DIAGNOSIS — E1121 Type 2 diabetes mellitus with diabetic nephropathy: Secondary | ICD-10-CM | POA: Diagnosis not present

## 2017-11-30 DIAGNOSIS — M1049 Other secondary gout, multiple sites: Secondary | ICD-10-CM | POA: Diagnosis not present

## 2017-11-30 DIAGNOSIS — E1121 Type 2 diabetes mellitus with diabetic nephropathy: Secondary | ICD-10-CM | POA: Diagnosis not present

## 2017-11-30 DIAGNOSIS — I10 Essential (primary) hypertension: Secondary | ICD-10-CM | POA: Diagnosis not present

## 2017-11-30 DIAGNOSIS — N401 Enlarged prostate with lower urinary tract symptoms: Secondary | ICD-10-CM | POA: Diagnosis not present

## 2018-01-11 HISTORY — PX: BARIATRIC SURGERY: SHX1103

## 2018-02-04 DIAGNOSIS — G43A0 Cyclical vomiting, not intractable: Secondary | ICD-10-CM | POA: Diagnosis not present

## 2018-02-04 DIAGNOSIS — E860 Dehydration: Secondary | ICD-10-CM | POA: Diagnosis not present

## 2018-02-14 DIAGNOSIS — K2970 Gastritis, unspecified, without bleeding: Secondary | ICD-10-CM | POA: Diagnosis not present

## 2018-02-14 DIAGNOSIS — R112 Nausea with vomiting, unspecified: Secondary | ICD-10-CM | POA: Diagnosis not present

## 2018-03-21 DIAGNOSIS — I10 Essential (primary) hypertension: Secondary | ICD-10-CM | POA: Diagnosis not present

## 2018-03-21 DIAGNOSIS — N401 Enlarged prostate with lower urinary tract symptoms: Secondary | ICD-10-CM | POA: Diagnosis not present

## 2018-03-21 DIAGNOSIS — M1049 Other secondary gout, multiple sites: Secondary | ICD-10-CM | POA: Diagnosis not present

## 2018-03-21 DIAGNOSIS — E1121 Type 2 diabetes mellitus with diabetic nephropathy: Secondary | ICD-10-CM | POA: Diagnosis not present

## 2018-03-27 ENCOUNTER — Telehealth: Payer: Self-pay | Admitting: Cardiology

## 2018-03-27 NOTE — Telephone Encounter (Signed)
Mitchell Parker called stating that yesterday his heart beat started going fast off and on. States that he became light headed also.   Patient was seeing Dr. Severiano Gilbert at [facility] but wants to switch back to [facility]..States that it is happening again today.  Please call [phone].

## 2018-03-27 NOTE — Telephone Encounter (Signed)
Pt says since last night heart rhythm has been "abnormal" denies CP/SOB/swelling/palpitations. BP 123/82 - doesn't know what HR has been running - hasn't been seen since 2002. Pt will need new patient appt - will forward to schedulers for 1st available NP appt

## 2018-04-09 DIAGNOSIS — Z6841 Body Mass Index (BMI) 40.0 and over, adult: Secondary | ICD-10-CM | POA: Diagnosis not present

## 2018-04-09 DIAGNOSIS — E1121 Type 2 diabetes mellitus with diabetic nephropathy: Secondary | ICD-10-CM | POA: Diagnosis not present

## 2018-04-09 DIAGNOSIS — N401 Enlarged prostate with lower urinary tract symptoms: Secondary | ICD-10-CM | POA: Diagnosis not present

## 2018-04-09 DIAGNOSIS — I10 Essential (primary) hypertension: Secondary | ICD-10-CM | POA: Diagnosis not present

## 2018-04-09 DIAGNOSIS — I82491 Acute embolism and thrombosis of other specified deep vein of right lower extremity: Secondary | ICD-10-CM | POA: Diagnosis not present

## 2018-04-29 ENCOUNTER — Encounter: Payer: Self-pay | Admitting: Cardiology

## 2018-04-29 NOTE — Progress Notes (Signed)
Cardiology Office Note  Date: 04/30/2018   ID: Mitchell Parker., DOB 01/13/1951, MRN [id number]  PCP: Mitchell Deiters, MD  Consulting Cardiologist: Mitchell Dell, MD   Chief Complaint  Patient presents with  . Palpitations    History of Present Illness: Mitchell Parker. is a 67 y.o. male referred for cardiology consultation by Dr. Olena Leatherwood for the evaluation of palpitations. He was followed remotely by this practice and more recently at [facility] (Dr. Adella Hare - August 2018). I reviewed extensive records and updated the chart.  He is also followed to the [facility] hospital system.  He is here today with his wife.  He states that he underwent bariatric surgery back in January, has lost 66 pounds.  Since February he has been having episodic palpitations, describes a forceful slow heartbeat, usually when he is still at nighttime with a "rush" up into his neck.  He has no associated chest pain or shortness of breath.  He has noticed this during the daytime but much less frequently.  Unrelated specifically to these palpitations he also feels somewhat lightheaded when he stands up quickly or changes position.  Dr. Olena Leatherwood has cut back on his antihypertensive medications.  Echocardiogram from February 2018 at [facility] showed LVEF 55%.  He has not had a follow-up study since that time.  Current cardiac regimen includes Coreg, Lasix, and lisinopril.  I personally reviewed his ECG today which shows sinus bradycardia with IVCD, low voltage, nonspecific T wave changes.  Past Medical History:  Diagnosis Date  . CKD (chronic kidney disease) stage 3, GFR 30-59 ml/min (HCC)   . COPD (chronic obstructive pulmonary disease) (HCC)   . Essential hypertension   . Gallstones   . Gout   . History of DVT (deep vein thrombosis)   . Kidney stones   . Lumbar disc disease   . Neuropathy   . Nonischemic cardiomyopathy (HCC)    Minor coronary atherosclerosis at cardiac catheterization 2003, LVEF initially 20% with  normalization  . Obesity   . RA (rheumatoid arthritis) (HCC)   . Sleep apnea   . Type 2 diabetes mellitus (HCC)     Past Surgical History:  Procedure Laterality Date  . CHOLECYSTECTOMY  01/18/2012   Procedure: LAPAROSCOPIC CHOLECYSTECTOMY WITH INTRAOPERATIVE CHOLANGIOGRAM;  Surgeon: Maisie Fus A. Cornett, MD;  Location: [facility];  Service: General;  Laterality: N/A;  . PILONIDAL CYST EXCISION  1997  . UMBILICAL HERNIA REPAIR    . VARICOSE VEIN SURGERY     left leg    Current Outpatient Medications  Medication Sig Dispense Refill  . albuterol-ipratropium (COMBIVENT) 18-103 MCG/ACT inhaler Inhale 2 puffs into the lungs every 6 (six) hours as needed. Wheezing    . allopurinol (ZYLOPRIM) 300 MG tablet Take 300 mg by mouth every morning.    [name] aspirin 81 MG tablet Take 1 tablet (81 mg total) by mouth daily. 90 tablet 3  . atorvastatin (LIPITOR) 80 MG tablet Take 80 mg by mouth.    . calcitRIOL (ROCALTROL) 0.25 MCG capsule Take 0.25 mcg by mouth daily with breakfast.     . carvedilol (COREG) 6.25 MG tablet Take 6.25 mg by mouth 2 (two) times daily with a meal.    . Colchicine 0.6 MG CAPS Take 0.6 mg by mouth daily.    . DULoxetine HCl (CYMBALTA PO) Take by mouth.    . furosemide (LASIX) 40 MG tablet Take 40 mg by mouth 2 (two) times daily.    [name] gabapentin (NEURONTIN) 300  MG capsule Take 300 mg by mouth 2 (two) times daily.      [name] HYDROXYCHLOROQUINE SULFATE PO Take 300 mg by mouth 2 (two) times daily.    . insulin aspart (NOVOLOG) 100 UNIT/ML injection Inject 50 Units into the skin 3 (three) times daily before meals.    . insulin NPH (HUMULIN N,NOVOLIN N) 100 UNIT/ML injection Inject 90 Units into the skin 2 (two) times daily.     [name] lisinopril (PRINIVIL,ZESTRIL) 5 MG tablet Take 5 mg by mouth daily.    . metoCLOPramide (REGLAN) 5 MG tablet Take 1 tablet (5 mg total) by mouth 4 (four) times daily. 65 tablet 0  . Multiple Vitamin (MULTIVITAMIN) capsule Take 1 capsule by mouth daily.    [name] warfarin  (COUMADIN) 2.5 MG tablet Take 2.5 mg by mouth daily. Taken with 5mg  tab to equal 7.5mg  daily.    [name] warfarin (COUMADIN) 5 MG tablet Take 5 mg by mouth daily. Taken with 2.5mg  daily to equal 7.5mg .     No current facility-administered medications for this visit.    Allergies:  Ciprofloxacin and Levofloxacin [levofloxacin]   Social History: The patient  reports that he quit smoking about 23 years ago. His smoking use included cigarettes. He has never used smokeless tobacco. He reports that he does not drink alcohol or use drugs.   Family History: The patient's family history includes Diabetes in his brother and father; Heart disease in his father; Hypertension in his father and mother; Stroke in his mother.   ROS:  Please see the history of present illness. Otherwise, complete review of systems is positive for chronic venous stasis.  All other systems are reviewed and negative.   Physical Exam: VS:  BP 122/64   Pulse (!) 56   Ht 6' (1.829 m)   Wt 297 lb (134.7 kg)   SpO2 96%   BMI 40.28 kg/m , BMI Body mass index is 40.28 kg/m.  Wt Readings from Last 3 Encounters:  04/30/18 297 lb (134.7 kg)  04/10/17 (!) 369 lb 12.8 oz (167.7 kg)  12/11/12 (!) 341 lb (154.7 kg)    General: Obese male, appears comfortable at rest. HEENT: Conjunctiva and lids normal, oropharynx clear. Neck: Supple, no elevated JVP or carotid bruits, no thyromegaly. Lungs: Clear to auscultation, nonlabored breathing at rest. Cardiac: Regular rate and rhythm, no S3 or significant systolic murmur, no pericardial rub. Abdomen: Soft, nontender, bowel sounds present. Extremities: Chronic appearing venous stasis below the knees with 1-2+ edema, distal pulses 1-2+. Skin: Warm and dry. Musculoskeletal: No kyphosis. Neuropsychiatric: Alert and oriented x3, affect grossly appropriate.  ECG: I personally reviewed the tracing from 01/16/2013 which showed normal sinus rhythm.  Recent Labwork:  February 2018: Hgb A1c 5.8, Hgb  13.6, platelets 164, potassium 4.1, BUN 16, creatinine 1.33, cholesterol 109, TG 213, HDL 30, LDL 36     Other Studies Reviewed Today:  Echocardiogram 02/01/2017: LEFT VENTRICLE The left ventricular size is normal. There is mild concentric left  ventricular hypertrophy. Left ventricular systolic function is normal. LV  ejection fraction = 55%. Left ventricular filling pattern is impaired. No  segmental wall motion abnormalities seen in the left ventricle.  RIGHT VENTRICLE The right ventricle is not well visualized.  LEFT ATRIUM The left atrium is mildly dilated.  RIGHT ATRIUM Right atrium not well visualized. - AORTIC VALVE The aortic valve is trileaflet. The aortic valve opens well. There is aortic  valve sclerosis. There is no aortic regurgitation. - MITRAL VALVE The mitral valve  leaflets appear normal. There is no mitral regurgitation  noted. - TRICUSPID VALVE The tricuspid valve is not well visualized. No tricuspid regurgitation. There  was insufficient TR detected to calculate RV systolic pressure. - PULMONIC VALVE The pulmonic valve is not well visualized. - ARTERIES The aortic root is normal size. - VENOUS Pulmonary venous flow pattern not well visualized. The IVC is normal in size  with an inspiratory collapse of greater then 50%, suggesting normal right  atrial pressure. - EFFUSION There is no pericardial effusion.  Lexiscan Myoview 12/11/2012: Impression  Exercise Capacity:  Lexiscan with no exercise. BP Response:  Normal blood pressure response. Clinical Symptoms:  No significant symptoms noted. ECG Impression:  No significant ST segment change suggestive of ischemia. Comparison with Prior Nuclear Study: No previous nuclear study performed  Overall Impression:  Low risk stress nuclear study without evidence for ischemia.  LV Wall Motion:  Mild global LV dysfunction with EF 42%.  Assessment and Plan:  1.  Palpitations as outlined above, possibly  PVCs.  Plan is to obtain a 48-hour Holter monitor for further investigation, no changes in medications as yet.  2.  History of nonischemic cardiomyopathy with normalization of LVEF on medical therapy.  Last echocardiogram was in February 2018, a follow-up study will be obtained.  3.  Essential hypertension, blood pressure is well controlled today.  Antihypertensives have had to be cut back, likely in association with significant weight loss following bariatric surgery.  He might even need a lower dose of Lasix.  4.  Morbid obesity status post bariatric surgery in January.  Current medicines were reviewed with the patient today.   Orders Placed This Encounter  Procedures  . Holter monitor - 48 hour  . EKG 12-Lead  . ECHOCARDIOGRAM COMPLETE    Disposition: Call with test results.  Signed, Jonelle Sidle, MD, [facility] 04/30/2018 11:01 AM    [facility] at [facility] [address] [place], [place], [place] [address] Phone: [phone]; Fax: [phone]

## 2018-04-30 ENCOUNTER — Telehealth: Payer: Self-pay | Admitting: Cardiology

## 2018-04-30 ENCOUNTER — Encounter

## 2018-04-30 ENCOUNTER — Ambulatory Visit: Payer: Medicare HMO | Admitting: Cardiology

## 2018-04-30 ENCOUNTER — Encounter: Payer: Self-pay | Admitting: Cardiology

## 2018-04-30 VITALS — BP 122/64 | HR 56 | Ht 72.0 in | Wt 297.0 lb

## 2018-04-30 DIAGNOSIS — I10 Essential (primary) hypertension: Secondary | ICD-10-CM | POA: Diagnosis not present

## 2018-04-30 DIAGNOSIS — I428 Other cardiomyopathies: Secondary | ICD-10-CM

## 2018-04-30 DIAGNOSIS — E1121 Type 2 diabetes mellitus with diabetic nephropathy: Secondary | ICD-10-CM | POA: Diagnosis not present

## 2018-04-30 DIAGNOSIS — R002 Palpitations: Secondary | ICD-10-CM

## 2018-04-30 DIAGNOSIS — N182 Chronic kidney disease, stage 2 (mild): Secondary | ICD-10-CM | POA: Diagnosis not present

## 2018-04-30 NOTE — Patient Instructions (Signed)
Medication Instructions:  Your physician recommends that you continue on your current medications as directed. Please refer to the Current Medication list given to you today.  Labwork: NONE  Testing/Procedures: Your physician has requested that you have an echocardiogram. Echocardiography is a painless test that uses sound waves to create images of your heart. It provides your doctor with information about the size and shape of your heart and how well your heart's chambers and valves are working. This procedure takes approximately one hour. There are no restrictions for this procedure.  Your physician has recommended that you wear a holter monitor FOR 48 HOURS. Holter monitors are medical devices that record the heart's electrical activity. Doctors most often use these monitors to diagnose arrhythmias. Arrhythmias are problems with the speed or rhythm of the heartbeat. The monitor is a small, portable device. You can wear one while you do your normal daily activities. This is usually used to diagnose what is causing palpitations/syncope (passing out).   Follow-Up: Your physician recommends that you schedule a follow-up appointment PENDING TEST RESULTS   Any Other Special Instructions Will Be Listed Below (If Applicable).  If you need a refill on your cardiac medications before your next appointment, please call your pharmacy.

## 2018-04-30 NOTE — Telephone Encounter (Signed)
Echo scheduled at [facility] May 07, 2018

## 2018-05-03 ENCOUNTER — Encounter (INDEPENDENT_AMBULATORY_CARE_PROVIDER_SITE_OTHER): Payer: Medicare HMO

## 2018-05-03 DIAGNOSIS — R002 Palpitations: Secondary | ICD-10-CM | POA: Diagnosis not present

## 2018-05-07 ENCOUNTER — Ambulatory Visit (HOSPITAL_COMMUNITY)
Admission: RE | Admit: 2018-05-07 | Discharge: 2018-05-07 | Disposition: A | Discharge status: Home / Self Care | Payer: Medicare HMO | Source: Ambulatory Visit | Attending: Cardiology | Admitting: Cardiology

## 2018-05-07 DIAGNOSIS — E1122 Type 2 diabetes mellitus with diabetic chronic kidney disease: Secondary | ICD-10-CM | POA: Insufficient documentation

## 2018-05-07 DIAGNOSIS — Z6841 Body Mass Index (BMI) 40.0 and over, adult: Secondary | ICD-10-CM | POA: Insufficient documentation

## 2018-05-07 DIAGNOSIS — J449 Chronic obstructive pulmonary disease, unspecified: Secondary | ICD-10-CM | POA: Diagnosis not present

## 2018-05-07 DIAGNOSIS — I1310 Hypertensive heart and chronic kidney disease without heart failure, with stage 1 through stage 4 chronic kidney disease, or unspecified chronic kidney disease: Secondary | ICD-10-CM | POA: Insufficient documentation

## 2018-05-07 DIAGNOSIS — N189 Chronic kidney disease, unspecified: Secondary | ICD-10-CM | POA: Diagnosis not present

## 2018-05-07 DIAGNOSIS — I428 Other cardiomyopathies: Secondary | ICD-10-CM | POA: Diagnosis not present

## 2018-05-07 NOTE — Progress Notes (Signed)
Echocardiogram 2D Echocardiogram has been performed.  Mitchell Parker 05/07/2018, 2:27 PM

## 2018-05-08 ENCOUNTER — Telehealth: Payer: Self-pay

## 2018-05-08 NOTE — Telephone Encounter (Signed)
Patient notified. Routed to PCP 

## 2018-05-08 NOTE — Telephone Encounter (Signed)
Patient notified. Routed to PCP. Patient would like to know if he needs to follow up?

## 2018-05-08 NOTE — Telephone Encounter (Signed)
-----   Message from Norva Pavlov, LPN sent at 2/95/1884 11:14 AM EDT -----   ----- Message ----- From: Jonelle Sidle, MD Sent: 05/08/2018   9:52 AM To: Eustace Moore, LPN  Results reviewed.  Sinus rhythm is present throughout.  He does have rare to occasional PACs and PVCs which he is likely feeling as palpitations.  Brief runs of atrial tachycardia also noted which he may be feeling.  Given average heart rate of only 52 bpm, will continue with current dose of Coreg for now.  Importantly, there were no sustained arrhythmias or pauses. A copy of this test should be forwarded to Toma Deiters, MD.

## 2018-05-08 NOTE — Telephone Encounter (Signed)
If he is planning to establish with our practice instead of [facility], we should see him back in 6 months.

## 2018-05-08 NOTE — Telephone Encounter (Signed)
-----   Message from Eustace Moore, LPN sent at 4/74/2595  3:22 PM EDT -----   ----- Message ----- From: Jonelle Sidle, MD Sent: 05/07/2018   3:11 PM To: Eustace Moore, LPN  Results reviewed.  LVEF low normal range at 50 to 55%.  Continue with medical therapy and follow-up on monitor results. A copy of this test should be forwarded to Toma Deiters, MD.

## 2018-05-08 NOTE — Telephone Encounter (Signed)
Patient notified and verbalized understanding. 

## 2018-06-04 DIAGNOSIS — E1121 Type 2 diabetes mellitus with diabetic nephropathy: Secondary | ICD-10-CM | POA: Diagnosis not present

## 2018-06-04 DIAGNOSIS — I10 Essential (primary) hypertension: Secondary | ICD-10-CM | POA: Diagnosis not present

## 2018-06-04 DIAGNOSIS — N182 Chronic kidney disease, stage 2 (mild): Secondary | ICD-10-CM | POA: Diagnosis not present

## 2018-07-18 DIAGNOSIS — Z6835 Body mass index (BMI) 35.0-35.9, adult: Secondary | ICD-10-CM | POA: Diagnosis not present

## 2018-07-18 DIAGNOSIS — E1121 Type 2 diabetes mellitus with diabetic nephropathy: Secondary | ICD-10-CM | POA: Diagnosis not present

## 2018-07-18 DIAGNOSIS — J440 Chronic obstructive pulmonary disease with acute lower respiratory infection: Secondary | ICD-10-CM | POA: Diagnosis not present

## 2018-07-18 DIAGNOSIS — I82491 Acute embolism and thrombosis of other specified deep vein of right lower extremity: Secondary | ICD-10-CM | POA: Diagnosis not present

## 2018-07-18 DIAGNOSIS — Z6841 Body Mass Index (BMI) 40.0 and over, adult: Secondary | ICD-10-CM | POA: Diagnosis not present

## 2018-07-18 DIAGNOSIS — I10 Essential (primary) hypertension: Secondary | ICD-10-CM | POA: Diagnosis not present

## 2018-07-18 DIAGNOSIS — J441 Chronic obstructive pulmonary disease with (acute) exacerbation: Secondary | ICD-10-CM | POA: Diagnosis not present

## 2018-07-18 DIAGNOSIS — N401 Enlarged prostate with lower urinary tract symptoms: Secondary | ICD-10-CM | POA: Diagnosis not present

## 2018-08-24 DIAGNOSIS — I10 Essential (primary) hypertension: Secondary | ICD-10-CM | POA: Diagnosis not present

## 2018-08-24 DIAGNOSIS — N401 Enlarged prostate with lower urinary tract symptoms: Secondary | ICD-10-CM | POA: Diagnosis not present

## 2018-08-24 DIAGNOSIS — E1121 Type 2 diabetes mellitus with diabetic nephropathy: Secondary | ICD-10-CM | POA: Diagnosis not present

## 2018-09-07 ENCOUNTER — Emergency Department (HOSPITAL_COMMUNITY): Payer: Medicare HMO

## 2018-09-07 ENCOUNTER — Other Ambulatory Visit: Payer: Self-pay

## 2018-09-07 ENCOUNTER — Inpatient Hospital Stay (HOSPITAL_COMMUNITY)
Admission: EM | Admit: 2018-09-07 | Discharge: 2018-09-10 | DRG: 083 | Disposition: A | Discharge status: Home Health | Payer: Medicare HMO | Attending: Internal Medicine | Admitting: Internal Medicine

## 2018-09-07 ENCOUNTER — Encounter (HOSPITAL_COMMUNITY): Payer: Self-pay | Admitting: Emergency Medicine

## 2018-09-07 DIAGNOSIS — R402252 Coma scale, best verbal response, oriented, at arrival to emergency department: Secondary | ICD-10-CM | POA: Diagnosis present

## 2018-09-07 DIAGNOSIS — I619 Nontraumatic intracerebral hemorrhage, unspecified: Secondary | ICD-10-CM | POA: Diagnosis not present

## 2018-09-07 DIAGNOSIS — D696 Thrombocytopenia, unspecified: Secondary | ICD-10-CM | POA: Diagnosis not present

## 2018-09-07 DIAGNOSIS — Y92002 Bathroom of unspecified non-institutional (private) residence single-family (private) house as the place of occurrence of the external cause: Secondary | ICD-10-CM

## 2018-09-07 DIAGNOSIS — R55 Syncope and collapse: Secondary | ICD-10-CM

## 2018-09-07 DIAGNOSIS — S065XAA Traumatic subdural hemorrhage with loss of consciousness status unknown, initial encounter: Secondary | ICD-10-CM

## 2018-09-07 DIAGNOSIS — I951 Orthostatic hypotension: Secondary | ICD-10-CM

## 2018-09-07 DIAGNOSIS — Z79899 Other long term (current) drug therapy: Secondary | ICD-10-CM | POA: Diagnosis not present

## 2018-09-07 DIAGNOSIS — I428 Other cardiomyopathies: Secondary | ICD-10-CM | POA: Diagnosis present

## 2018-09-07 DIAGNOSIS — I609 Nontraumatic subarachnoid hemorrhage, unspecified: Secondary | ICD-10-CM | POA: Diagnosis not present

## 2018-09-07 DIAGNOSIS — Z91041 Radiographic dye allergy status: Secondary | ICD-10-CM

## 2018-09-07 DIAGNOSIS — S065X9A Traumatic subdural hemorrhage with loss of consciousness of unspecified duration, initial encounter: Secondary | ICD-10-CM | POA: Diagnosis not present

## 2018-09-07 DIAGNOSIS — E1122 Type 2 diabetes mellitus with diabetic chronic kidney disease: Secondary | ICD-10-CM | POA: Diagnosis present

## 2018-09-07 DIAGNOSIS — Z66 Do not resuscitate: Secondary | ICD-10-CM | POA: Diagnosis present

## 2018-09-07 DIAGNOSIS — E785 Hyperlipidemia, unspecified: Secondary | ICD-10-CM | POA: Diagnosis not present

## 2018-09-07 DIAGNOSIS — Z86711 Personal history of pulmonary embolism: Secondary | ICD-10-CM | POA: Diagnosis not present

## 2018-09-07 DIAGNOSIS — W182XXA Fall in (into) shower or empty bathtub, initial encounter: Secondary | ICD-10-CM | POA: Diagnosis present

## 2018-09-07 DIAGNOSIS — Z87891 Personal history of nicotine dependence: Secondary | ICD-10-CM | POA: Diagnosis not present

## 2018-09-07 DIAGNOSIS — M069 Rheumatoid arthritis, unspecified: Secondary | ICD-10-CM | POA: Diagnosis not present

## 2018-09-07 DIAGNOSIS — E1140 Type 2 diabetes mellitus with diabetic neuropathy, unspecified: Secondary | ICD-10-CM | POA: Diagnosis present

## 2018-09-07 DIAGNOSIS — G4733 Obstructive sleep apnea (adult) (pediatric): Secondary | ICD-10-CM | POA: Diagnosis present

## 2018-09-07 DIAGNOSIS — R402362 Coma scale, best motor response, obeys commands, at arrival to emergency department: Secondary | ICD-10-CM | POA: Diagnosis not present

## 2018-09-07 DIAGNOSIS — Z9884 Bariatric surgery status: Secondary | ICD-10-CM

## 2018-09-07 DIAGNOSIS — N183 Chronic kidney disease, stage 3 (moderate): Secondary | ICD-10-CM | POA: Diagnosis present

## 2018-09-07 DIAGNOSIS — Z86718 Personal history of other venous thrombosis and embolism: Secondary | ICD-10-CM

## 2018-09-07 DIAGNOSIS — I10 Essential (primary) hypertension: Secondary | ICD-10-CM | POA: Diagnosis not present

## 2018-09-07 DIAGNOSIS — I129 Hypertensive chronic kidney disease with stage 1 through stage 4 chronic kidney disease, or unspecified chronic kidney disease: Secondary | ICD-10-CM | POA: Diagnosis not present

## 2018-09-07 DIAGNOSIS — S199XXA Unspecified injury of neck, initial encounter: Secondary | ICD-10-CM | POA: Diagnosis not present

## 2018-09-07 DIAGNOSIS — Z7901 Long term (current) use of anticoagulants: Secondary | ICD-10-CM | POA: Diagnosis not present

## 2018-09-07 DIAGNOSIS — Z881 Allergy status to other antibiotic agents status: Secondary | ICD-10-CM

## 2018-09-07 DIAGNOSIS — J449 Chronic obstructive pulmonary disease, unspecified: Secondary | ICD-10-CM | POA: Diagnosis present

## 2018-09-07 DIAGNOSIS — Z7982 Long term (current) use of aspirin: Secondary | ICD-10-CM | POA: Diagnosis not present

## 2018-09-07 DIAGNOSIS — M25552 Pain in left hip: Secondary | ICD-10-CM | POA: Diagnosis not present

## 2018-09-07 DIAGNOSIS — R402142 Coma scale, eyes open, spontaneous, at arrival to emergency department: Secondary | ICD-10-CM | POA: Diagnosis present

## 2018-09-07 DIAGNOSIS — Z794 Long term (current) use of insulin: Secondary | ICD-10-CM

## 2018-09-07 DIAGNOSIS — S066X9A Traumatic subarachnoid hemorrhage with loss of consciousness of unspecified duration, initial encounter: Secondary | ICD-10-CM | POA: Diagnosis not present

## 2018-09-07 DIAGNOSIS — G901 Familial dysautonomia [Riley-Day]: Secondary | ICD-10-CM | POA: Diagnosis present

## 2018-09-07 DIAGNOSIS — S066X0A Traumatic subarachnoid hemorrhage without loss of consciousness, initial encounter: Secondary | ICD-10-CM | POA: Diagnosis not present

## 2018-09-07 HISTORY — DX: Nontraumatic subarachnoid hemorrhage, unspecified: I60.9

## 2018-09-07 HISTORY — DX: Syncope and collapse: R55

## 2018-09-07 LAB — BASIC METABOLIC PANEL
Anion gap: 7 (ref 5–15)
BUN: 26 mg/dL — ABNORMAL HIGH (ref 8–23)
CO2: 33 mmol/L — ABNORMAL HIGH (ref 22–32)
Calcium: 8.8 mg/dL — ABNORMAL LOW (ref 8.9–10.3)
Chloride: 102 mmol/L (ref 98–111)
Creatinine, Ser: 1.13 mg/dL (ref 0.61–1.24)
GFR calc Af Amer: >60 mL/min (ref >60)
GFR calc non Af Amer: >60 mL/min (ref >60)
Glucose, Bld: 118 mg/dL — ABNORMAL HIGH (ref 70–99)
Potassium: 3.6 mmol/L (ref 3.5–5.1)
Sodium: 142 mmol/L (ref 135–145)

## 2018-09-07 LAB — HEMOGLOBIN A1C
Hgb A1c MFr Bld: 6 % — ABNORMAL HIGH (ref 4.8–5.6)
Mean Plasma Glucose: 125.5 mg/dL

## 2018-09-07 LAB — PROTIME-INR
INR: 2.11
INR: 1.26
INR: 1.23
Prothrombin Time: 23.5 seconds — ABNORMAL HIGH (ref 11.4–15.2)
Prothrombin Time: 15.7 seconds — ABNORMAL HIGH (ref 11.4–15.2)
Prothrombin Time: 15.4 seconds — ABNORMAL HIGH (ref 11.4–15.2)

## 2018-09-07 LAB — CBC WITH DIFFERENTIAL/PLATELET
Basophils Absolute: 0 10*3/uL (ref 0.0–0.1)
Basophils Relative: 0 %
Eosinophils Absolute: 0.1 10*3/uL (ref 0.0–0.7)
Eosinophils Relative: 2 %
HCT: 37.4 % — ABNORMAL LOW (ref 39.0–52.0)
Hemoglobin: 12.7 g/dL — ABNORMAL LOW (ref 13.0–17.0)
Lymphocytes Relative: 27 %
Lymphs Abs: 1.2 10*3/uL (ref 0.7–4.0)
MCH: 31.6 pg (ref 26.0–34.0)
MCHC: 34 g/dL (ref 30.0–36.0)
MCV: 93 fL (ref 78.0–100.0)
Monocytes Absolute: 0.4 10*3/uL (ref 0.1–1.0)
Monocytes Relative: 9 %
Neutro Abs: 2.8 10*3/uL (ref 1.7–7.7)
Neutrophils Relative %: 62 %
Platelets: 90 10*3/uL — ABNORMAL LOW (ref 150–400)
RBC: 4.02 MIL/uL — ABNORMAL LOW (ref 4.22–5.81)
RDW: 14.6 % (ref 11.5–15.5)
WBC: 4.6 10*3/uL (ref 4.0–10.5)

## 2018-09-07 LAB — GLUCOSE, CAPILLARY
Glucose-Capillary: 134 mg/dL — ABNORMAL HIGH (ref 70–99)
Glucose-Capillary: 109 mg/dL — ABNORMAL HIGH (ref 70–99)

## 2018-09-07 LAB — VITAMIN B12: Vitamin B-12: 2235 pg/mL — ABNORMAL HIGH (ref 180–914)

## 2018-09-07 LAB — TROPONIN I: Troponin I: <0.03 ng/mL (ref <0.03)

## 2018-09-07 LAB — CBG MONITORING, ED: Glucose-Capillary: 115 mg/dL — ABNORMAL HIGH (ref 70–99)

## 2018-09-07 LAB — FOLATE: Folate: 28.9 ng/mL (ref >5.9)

## 2018-09-07 MED ORDER — IPRATROPIUM-ALBUTEROL 0.5-2.5 (3) MG/3ML IN SOLN: 3.0000 mL | Freq: Four times a day (QID) | RESPIRATORY_TRACT | Status: DC | PRN

## 2018-09-07 MED ORDER — VITAMIN K1 10 MG/ML IJ SOLN
10.0000 mg | Freq: Once | INTRAMUSCULAR | Status: DC
  Filled 2018-09-07: qty 1

## 2018-09-07 MED ORDER — IPRATROPIUM-ALBUTEROL 18-103 MCG/ACT IN AERO: 2.0000 | INHALATION_SPRAY | Freq: Four times a day (QID) | RESPIRATORY_TRACT | Status: DC | PRN

## 2018-09-07 MED ORDER — CARVEDILOL 3.125 MG PO TABS
6.2500 mg | ORAL_TABLET | Freq: Two times a day (BID) | ORAL | Status: DC
  Administered 2018-09-07 – 2018-09-08 (×3): 6.25 mg via ORAL
  Filled 2018-09-07 (×4): qty 2

## 2018-09-07 MED ORDER — GABAPENTIN 300 MG PO CAPS
300.0000 mg | ORAL_CAPSULE | Freq: Two times a day (BID) | ORAL | Status: DC
  Administered 2018-09-07 – 2018-09-10 (×7): 300 mg via ORAL
  Filled 2018-09-07 (×7): qty 1

## 2018-09-07 MED ORDER — HYDROXYCHLOROQUINE SULFATE 200 MG PO TABS
300.0000 mg | ORAL_TABLET | Freq: Two times a day (BID) | ORAL | Status: DC
  Administered 2018-09-07 – 2018-09-10 (×6): 300 mg via ORAL
  Filled 2018-09-07 (×6): qty 2

## 2018-09-07 MED ORDER — METOCLOPRAMIDE HCL 5 MG/ML IJ SOLN
10.0000 mg | Freq: Once | INTRAMUSCULAR | Status: AC
  Administered 2018-09-07: 10 mg via INTRAVENOUS
  Filled 2018-09-07: qty 2

## 2018-09-07 MED ORDER — VITAMIN K1 10 MG/ML IJ SOLN
10.0000 mg | INTRAVENOUS | Status: AC
  Administered 2018-09-07: 10 mg via INTRAVENOUS
  Filled 2018-09-07: qty 1

## 2018-09-07 MED ORDER — DULOXETINE HCL 30 MG PO CPEP
30.0000 mg | ORAL_CAPSULE | Freq: Every day | ORAL | Status: DC
  Administered 2018-09-07 – 2018-09-10 (×4): 30 mg via ORAL
  Filled 2018-09-07 (×4): qty 1

## 2018-09-07 MED ORDER — IOPAMIDOL (ISOVUE-370) INJECTION 76%
80.0000 mL | Freq: Once | INTRAVENOUS | Status: AC | PRN
  Administered 2018-09-07: 13:00:00 via INTRAVENOUS

## 2018-09-07 MED ORDER — LISINOPRIL 5 MG PO TABS
5.0000 mg | ORAL_TABLET | Freq: Every day | ORAL | Status: DC
  Administered 2018-09-08: 5 mg via ORAL
  Filled 2018-09-07 (×3): qty 1

## 2018-09-07 MED ORDER — DIPHENHYDRAMINE HCL 50 MG/ML IJ SOLN
25.0000 mg | Freq: Once | INTRAMUSCULAR | Status: AC
  Administered 2018-09-07: 25 mg via INTRAVENOUS
  Filled 2018-09-07: qty 1

## 2018-09-07 MED ORDER — CALCIUM CARBONATE 1500 (600 CA) MG PO TABS
600.0000 mg | ORAL_TABLET | Freq: Two times a day (BID) | ORAL | Status: DC
  Administered 2018-09-08 – 2018-09-09 (×3): 1500 mg via ORAL
  Filled 2018-09-07 (×10): qty 1

## 2018-09-07 MED ORDER — ACETAMINOPHEN 650 MG RE SUPP: 650.0000 mg | Freq: Four times a day (QID) | RECTAL | Status: DC | PRN

## 2018-09-07 MED ORDER — ONDANSETRON HCL 4 MG PO TABS: 4.0000 mg | ORAL_TABLET | Freq: Four times a day (QID) | ORAL | Status: DC | PRN

## 2018-09-07 MED ORDER — ONDANSETRON HCL 4 MG/2ML IJ SOLN
4.0000 mg | Freq: Once | INTRAMUSCULAR | Status: AC
  Administered 2018-09-07: 4 mg via INTRAVENOUS
  Filled 2018-09-07: qty 2

## 2018-09-07 MED ORDER — SODIUM CHLORIDE 0.9 % IV BOLUS
500.0000 mL | Freq: Once | INTRAVENOUS | Status: AC
  Administered 2018-09-07: 500 mL via INTRAVENOUS

## 2018-09-07 MED ORDER — INSULIN ASPART 100 UNIT/ML ~~LOC~~ SOLN
0.0000 [IU] | Freq: Three times a day (TID) | SUBCUTANEOUS | Status: DC
  Administered 2018-09-07 – 2018-09-08 (×2): 1 [IU] via SUBCUTANEOUS

## 2018-09-07 MED ORDER — LISINOPRIL 5 MG PO TABS
5.0000 mg | ORAL_TABLET | Freq: Once | ORAL | Status: AC
  Administered 2018-09-07: 5 mg via ORAL
  Filled 2018-09-07: qty 1

## 2018-09-07 MED ORDER — CALCITRIOL 0.25 MCG PO CAPS
0.2500 ug | ORAL_CAPSULE | Freq: Every day | ORAL | Status: DC
  Administered 2018-09-08 – 2018-09-10 (×3): 0.25 ug via ORAL
  Filled 2018-09-07 (×3): qty 1

## 2018-09-07 MED ORDER — POTASSIUM CHLORIDE IN NACL 20-0.9 MEQ/L-% IV SOLN
INTRAVENOUS | Status: AC
  Administered 2018-09-07 – 2018-09-08 (×2): via INTRAVENOUS

## 2018-09-07 MED ORDER — ACETAMINOPHEN 500 MG PO TABS
1000.0000 mg | ORAL_TABLET | Freq: Once | ORAL | Status: AC
  Administered 2018-09-07: 1000 mg via ORAL
  Filled 2018-09-07: qty 2

## 2018-09-07 MED ORDER — MULTIVITAMINS PO CAPS: 1.0000 | ORAL_CAPSULE | Freq: Every day | ORAL | Status: DC

## 2018-09-07 MED ORDER — ATORVASTATIN CALCIUM 40 MG PO TABS
80.0000 mg | ORAL_TABLET | Freq: Every day | ORAL | Status: DC
  Administered 2018-09-07 – 2018-09-09 (×3): 80 mg via ORAL
  Filled 2018-09-07 (×3): qty 2

## 2018-09-07 MED ORDER — ADULT MULTIVITAMIN W/MINERALS CH
1.0000 | ORAL_TABLET | Freq: Every day | ORAL | Status: DC
  Administered 2018-09-07 – 2018-09-10 (×4): 1 via ORAL
  Filled 2018-09-07 (×4): qty 1

## 2018-09-07 MED ORDER — ACETAMINOPHEN 325 MG PO TABS
650.0000 mg | ORAL_TABLET | Freq: Four times a day (QID) | ORAL | Status: DC | PRN
  Administered 2018-09-07 – 2018-09-09 (×5): 650 mg via ORAL
  Filled 2018-09-07 (×6): qty 2

## 2018-09-07 MED ORDER — ALLOPURINOL 300 MG PO TABS
300.0000 mg | ORAL_TABLET | Freq: Every morning | ORAL | Status: DC
  Administered 2018-09-07 – 2018-09-10 (×4): 300 mg via ORAL
  Filled 2018-09-07 (×4): qty 1

## 2018-09-07 MED ORDER — ONDANSETRON HCL 4 MG/2ML IJ SOLN: 4.0000 mg | Freq: Four times a day (QID) | INTRAMUSCULAR | Status: DC | PRN

## 2018-09-07 MED ORDER — PANTOPRAZOLE SODIUM 40 MG PO TBEC
40.0000 mg | DELAYED_RELEASE_TABLET | Freq: Every day | ORAL | Status: DC
  Administered 2018-09-07 – 2018-09-10 (×4): 40 mg via ORAL
  Filled 2018-09-07 (×4): qty 1

## 2018-09-07 MED ORDER — TAMSULOSIN HCL 0.4 MG PO CAPS
0.4000 mg | ORAL_CAPSULE | Freq: Every day | ORAL | Status: DC
  Administered 2018-09-08 – 2018-09-09 (×2): 0.4 mg via ORAL
  Filled 2018-09-07 (×2): qty 1

## 2018-09-07 MED ORDER — PROTHROMBIN COMPLEX CONC HUMAN 500 UNITS IV KIT
1545.0000 [IU] | PACK | Freq: Once | Status: AC
  Administered 2018-09-07: 1545 [IU] via INTRAVENOUS
  Filled 2018-09-07: qty 1545

## 2018-09-07 MED ORDER — FUROSEMIDE 80 MG PO TABS
80.0000 mg | ORAL_TABLET | Freq: Two times a day (BID) | ORAL | Status: DC
  Administered 2018-09-07 – 2018-09-08 (×3): 80 mg via ORAL
  Filled 2018-09-07 (×4): qty 1

## 2018-09-07 NOTE — H&P (Signed)
History and Physical  Mitchell Parker URK:[id number] DOB: [birth year] DOA: 09/07/2018   PCP: Toma Deiters, MD   Patient coming from: Home  Chief Complaint: syncope  HPI:  Mitchell Parker. is a 67 y.o. male with medical history of COPD, nonischemic cardia myopathy, rheumatoid arthritis, OSA, diet-controlled diabetes mellitus, hypertension, PE/DVT presented after a syncopal episode.  The patient was getting up to go to the bathroom in the morning of 09/07/2018.  The patient went to blow his nose, and the next thing he remembered was waking up on the floor.  The patient's wife heard the patient fall, and came to check on the patient.  She stated that it was a matter of 1 minute from the time of the fall until she arrived.  The patient was awake and alert and conversant when she saw him.  They came to the emergency department by private vehicle.  The patient fell and hit the back of his head against the walk-in shower.  He denies any other injuries.  He had some pain in his back where he hit his head.  The patient had denied any prodromal symptoms including chest discomfort, shortness of breath, palpitations, aura.  He had denied any recent dyspnea on exertion or decreased exercise tolerance.  The patient has noted some dizziness after his gastric bypass in January 2019.  His carvedilol was decreased to 625 mg twice daily with improvement of his dizziness symptoms.  He denied any nausea, vomiting, bowel or bladder incontinence. In the emergency department, the patient was afebrile hemodynamically stable saturating 100% room air.  BMP, CBC were essentially unremarkable except for platelets of 90,000.  The patient was given vitamin K and Kcentra.  The case was discussed with neurosurgery, Dr. Maurice Small, who felt pt could be managed at [facility]  Assessment/Plan: Subarachnoid hemorrhage -Secondary to syncopal episode resulting in a fall and hitting his head -Patient presently has no focal neurologic  deficit -Case was discussed  neurosurgery, Dr. Maurice Small, who felt pt could be managed at [facility] -Monitor clinically over the next 24 hours  Syncope -Appears to have been vasovagal -Cardiology evaluation -05/07/2018 echo EF 50-55%, diffuse HK, trivial MR -Check orthostatics -Monitor on telemetry -May 2019 Holter monitor--sinus rhythm with occasional PACs and PVCs  Essential hypertension -Continue carvedilol and lisinopril  Diabetes mellitus type 2 with neuropathy -Continue duloxetine and gabapentin -The patient has lost >105 lbs since gastric bypass -no longer on insulin, now diet controlled  Rheumatoid arthritis -Continue Plaquenil  Hyperlipidemia -Continue statin  Thrombocytopenia -check B12 -check folate -02/15/17 CT abd did not suggest cirrhosis or splenomegaly -monitor      Past Medical History:  Diagnosis Date  . CKD (chronic kidney disease) stage 3, GFR 30-59 ml/min (HCC)   . COPD (chronic obstructive pulmonary disease) (HCC)   . Essential hypertension   . Gallstones   . Gout   . History of DVT (deep vein thrombosis)   . Kidney stones   . Lumbar disc disease   . Neuropathy   . Nonischemic cardiomyopathy (HCC)    Minor coronary atherosclerosis at cardiac catheterization 2003, LVEF initially 20% with normalization  . Obesity   . RA (rheumatoid arthritis) (HCC)   . Sleep apnea   . Type 2 diabetes mellitus (HCC)    Past Surgical History:  Procedure Laterality Date  . CHOLECYSTECTOMY  01/18/2012   Procedure: LAPAROSCOPIC CHOLECYSTECTOMY WITH INTRAOPERATIVE CHOLANGIOGRAM;  Surgeon: Maisie Fus A. Cornett, MD;  Location: [facility];  Service:  General;  Laterality: N/A;  . PILONIDAL CYST EXCISION  1997  . UMBILICAL HERNIA REPAIR    . VARICOSE VEIN SURGERY     left leg   Social History:  reports that he quit smoking about 23 years ago. His smoking use included cigarettes. He has never used smokeless tobacco. He reports that he does not drink alcohol or use  drugs.   Family History  Problem Relation Age of Onset  . Hypertension Mother   . Stroke Mother   . Diabetes Father   . Hypertension Father   . Heart disease Father   . Diabetes Brother   . Colon cancer Neg Hx      Allergies  Allergen Reactions  . Ciprofloxacin Hives  . Definity [Perflutren Lipid Microsphere] Nausea And Vomiting    During ECHO with Definity injection pt became nauseated and vomited   . Levofloxacin [Levofloxacin] Hives     Prior to Admission medications   Medication Sig Start Date End Date Taking? Authorizing Provider  albuterol-ipratropium (COMBIVENT) 18-103 MCG/ACT inhaler Inhale 2 puffs into the lungs every 6 (six) hours as needed. Wheezing   Yes [provider]  allopurinol (ZYLOPRIM) 300 MG tablet Take 300 mg by mouth every morning.   Yes [provider]  aspirin 81 MG tablet Take 1 tablet (81 mg total) by mouth daily. 10/09/12  Yes Short, Thea Silversmith, MD  atorvastatin (LIPITOR) 80 MG tablet Take 80 mg by mouth.   Yes [provider]  calcitRIOL (ROCALTROL) 0.25 MCG capsule Take 0.25 mcg by mouth daily with breakfast.    Yes [provider]  calcium carbonate (OSCAL) 1500 (600 Ca) MG TABS tablet Take 600 mg of elemental calcium by mouth 2 (two) times daily with a meal.   Yes [provider]  carvedilol (COREG) 6.25 MG tablet Take 6.25 mg by mouth 2 (two) times daily with a meal.   Yes [provider]  Colchicine 0.6 MG CAPS Take 0.6 mg by mouth daily.   Yes [provider]  Cyanocobalamin (VITAMIN B 12 PO) Take by mouth.   Yes [provider]  DULoxetine HCl (CYMBALTA PO) Take 30 mg by mouth.    Yes [provider]  furosemide (LASIX) 40 MG tablet Take 80 mg by mouth 2 (two) times daily.    Yes [provider]  gabapentin (NEURONTIN) 300 MG capsule Take 300 mg by mouth 2 (two) times daily.     Yes [provider]  HYDROXYCHLOROQUINE SULFATE PO Take 300 mg by  mouth 2 (two) times daily.   Yes [provider]  lisinopril (PRINIVIL,ZESTRIL) 5 MG tablet Take 5 mg by mouth daily.   Yes [provider]  metoCLOPramide (REGLAN) 5 MG tablet Take 1 tablet (5 mg total) by mouth 4 (four) times daily. 05/27/12  Yes Dorothea Ogle, MD  Multiple Vitamin (MULTIVITAMIN) capsule Take 1 capsule by mouth daily.   Yes [provider]  tamsulosin (FLOMAX) 0.4 MG CAPS capsule Take 0.4 mg by mouth.   Yes [provider]  warfarin (COUMADIN) 2.5 MG tablet Take 2.5 mg by mouth daily. Taken with 5mg  tab to equal 7.5mg  daily.   Yes [provider]  warfarin (COUMADIN) 5 MG tablet Take 5 mg by mouth daily. Taken with 2.5mg  daily to equal 7.5mg .   Yes [provider]  insulin aspart (NOVOLOG) 100 UNIT/ML injection Inject 50 Units into the skin 3 (three) times daily before meals.    [provider]  insulin  NPH (HUMULIN N,NOVOLIN N) 100 UNIT/ML injection Inject 90 Units into the skin 2 (two) times daily.     [provider]  pantoprazole (PROTONIX) 40 MG tablet Take 40 mg by mouth daily. 07/05/18   [provider]    Review of Systems:  Constitutional:  No weight loss, night sweats, Fevers, chills, fatigue.  Head&Eyes: No headache.  No vision loss.  No eye pain or scotoma ENT:  No Difficulty swallowing,Tooth/dental problems,Sore throat,  No ear ache, post nasal drip,  Cardio-vascular:  No chest pain, Orthopnea, PND, swelling in lower extremities,  dizziness, palpitations  GI:  No  abdominal pain, nausea, vomiting, diarrhea, loss of appetite, hematochezia, melena, heartburn, indigestion, Resp:  No shortness of breath with exertion or at rest. No cough. No coughing up of blood .No wheezing.No chest wall deformity  Skin:  no rash or lesions.  GU:  no dysuria, change in color of urine, no urgency or frequency. No flank pain.  Musculoskeletal:  No joint pain or swelling. No decreased range of  motion. No back pain.  Psych:  No change in mood or affect. No depression or anxiety. Neurologic: No headache, no dysesthesia, no focal weakness, no vision loss. No syncope  Physical Exam: Vitals:   09/07/18 0930 09/07/18 1100 09/07/18 1130 09/07/18 1200  BP: (!) 169/56 (!) 195/73 (!) 208/70 (!) 194/61  Pulse: (!) 49 (!) 47 (!) 47 (!) 47  Resp: 18 10 13 13   Temp:      TempSrc:      SpO2: 95% 100% 100% 100%  Weight:      Height:       General:  A&O x 3, NAD, nontoxic, pleasant/cooperative Head/Eye: No conjunctival hemorrhage, no icterus, [place]/AT, No nystagmus ENT:  No icterus,  No thrush, good dentition, no pharyngeal exudate Neck:  No masses, no lymphadenpathy, no bruits CV:  RRR, no rub, no gallop, no S3 Lung:  CTAB, good air movement, no wheeze, no rhonchi Abdomen: soft/NT, +BS, nondistended, no peritoneal signs Ext: No cyanosis, No rashes, No petechiae, No lymphangitis, No edema Neuro: CNII-XII intact, strength 4/5 in bilateral upper and lower extremities, no dysmetria  Labs on Admission:  Basic Metabolic Panel: Recent Labs  Lab 09/07/18 0859  NA 142  K 3.6  CL 102  CO2 33*  GLUCOSE 118*  BUN 26*  CREATININE 1.13  CALCIUM 8.8*   Liver Function Tests: No results for input(s): AST, ALT, ALKPHOS, BILITOT, PROT, ALBUMIN in the last 168 hours. No results for input(s): LIPASE, AMYLASE in the last 168 hours. No results for input(s): AMMONIA in the last 168 hours. CBC: Recent Labs  Lab 09/07/18 0859  WBC 4.6  NEUTROABS 2.8  HGB 12.7*  HCT 37.4*  MCV 93.0  PLT 90*   Coagulation Profile: Recent Labs  Lab 09/07/18 0859  INR 2.11   Cardiac Enzymes: Recent Labs  Lab 09/07/18 0859  TROPONINI <0.03   BNP: Invalid input(s): POCBNP CBG: Recent Labs  Lab 09/07/18 0818  GLUCAP 115*   Urine analysis:    Component Value Date/Time   COLORURINE YELLOW 10/08/2012 1053   APPEARANCEUR CLEAR 10/08/2012 1053   LABSPEC 1.014 10/08/2012 1053   PHURINE 8.0  10/08/2012 1053   GLUCOSEU 100 (A) 10/08/2012 1053   HGBUR SMALL (A) 10/08/2012 1053   BILIRUBINUR NEGATIVE 10/08/2012 1053   KETONESUR NEGATIVE 10/08/2012 1053   PROTEINUR 30 (A) 10/08/2012 1053   UROBILINOGEN 0.2 10/08/2012 1053   NITRITE NEGATIVE 10/08/2012 1053   LEUKOCYTESUR NEGATIVE 10/08/2012 1053  Sepsis Labs: @LABRCNTIP (procalcitonin:4,lacticidven:4) )No results found for this or any previous visit (from the past 240 hour(s)).   Radiological Exams on Admission: Ct Head Wo Contrast  Result Date: 09/07/2018 CLINICAL DATA:  Fall, hit back of head. EXAM: CT HEAD WITHOUT CONTRAST CT CERVICAL SPINE WITHOUT CONTRAST TECHNIQUE: Multidetector CT imaging of the head and cervical spine was performed following the standard protocol without intravenous contrast. Multiplanar CT image reconstructions of the cervical spine were also generated. COMPARISON:  07/12/2012 FINDINGS: CT HEAD FINDINGS Brain: Small amount of subarachnoid blood noted in the high left frontal sulci and possibly anterior right frontal sulci. No intraparenchymal hemorrhage. No mass effect or midline shift. No hydrocephalus. Vascular: No hyperdense vessel or unexpected calcification. Skull: No acute calvarial abnormality. Sinuses/Orbits: Visualized paranasal sinuses and mastoids clear. Orbital soft tissues unremarkable. Other: None CT CERVICAL SPINE FINDINGS Alignment: Normal Skull base and vertebrae: No acute fracture. No primary bone lesion or focal pathologic process. Soft tissues and spinal canal: No prevertebral fluid or swelling. No visible canal hematoma. Disc levels:  Maintained Upper chest: Negative Other: No acute findings IMPRESSION: Small amount of subarachnoid hemorrhage in the left frontal and possible anterior right frontal regions. No intraparenchymal hemorrhage, hydrocephalus or midline shift. No acute bony abnormality in the cervical spine. These results were called by telephone at the time of interpretation on  09/07/2018 at 9:38 am to Dr. Juleen China , who verbally acknowledged these results. Electronically Signed   By: Charlett Nose M.D.   On: 09/07/2018 09:39   Ct Cervical Spine Wo Contrast  Result Date: 09/07/2018 CLINICAL DATA:  Fall, hit back of head. EXAM: CT HEAD WITHOUT CONTRAST CT CERVICAL SPINE WITHOUT CONTRAST TECHNIQUE: Multidetector CT imaging of the head and cervical spine was performed following the standard protocol without intravenous contrast. Multiplanar CT image reconstructions of the cervical spine were also generated. COMPARISON:  07/12/2012 FINDINGS: CT HEAD FINDINGS Brain: Small amount of subarachnoid blood noted in the high left frontal sulci and possibly anterior right frontal sulci. No intraparenchymal hemorrhage. No mass effect or midline shift. No hydrocephalus. Vascular: No hyperdense vessel or unexpected calcification. Skull: No acute calvarial abnormality. Sinuses/Orbits: Visualized paranasal sinuses and mastoids clear. Orbital soft tissues unremarkable. Other: None CT CERVICAL SPINE FINDINGS Alignment: Normal Skull base and vertebrae: No acute fracture. No primary bone lesion or focal pathologic process. Soft tissues and spinal canal: No prevertebral fluid or swelling. No visible canal hematoma. Disc levels:  Maintained Upper chest: Negative Other: No acute findings IMPRESSION: Small amount of subarachnoid hemorrhage in the left frontal and possible anterior right frontal regions. No intraparenchymal hemorrhage, hydrocephalus or midline shift. No acute bony abnormality in the cervical spine. These results were called by telephone at the time of interpretation on 09/07/2018 at 9:38 am to Dr. Juleen China , who verbally acknowledged these results. Electronically Signed   By: Charlett Nose M.D.   On: 09/07/2018 09:39   Dg Hip Unilat W Or Wo Pelvis 2-3 Views Left  Result Date: 09/07/2018 CLINICAL DATA:  Chronic left hip pain. EXAM: DG HIP (WITH OR WITHOUT PELVIS) 2-3V LEFT COMPARISON:  None. FINDINGS:  There is no evidence of hip fracture or dislocation. There is no evidence of arthropathy or other focal bone abnormality. IMPRESSION: Normal left hip. Electronically Signed   By: Lupita Raider, M.D.   On: 09/07/2018 09:24    EKG: Independently reviewed. Sinus brady with IVCD    Time spent:60 minutes Code Status:   FULL Family Communication:  Spouse update at bedside 9/13  Disposition Plan: expect 1-2 day hospitalization Consults called: neurosurgery--Ostergard DVT Prophylaxis: SCDs  Catarina Hartshorn, DO  [facility] Pager [phone]  If 7PM-7AM, please contact night-coverage [email or web address] Password [facility] 09/07/2018, 12:46 PM

## 2018-09-07 NOTE — ED Triage Notes (Addendum)
Pt became dizzy at 0700 this morning, fell back in the shower hitting the back of his head.  Abrasion to back of head.  Pt is on blood thinners.  Pt c/o of headache, dizziness and left hip pain.   Pt's wife states pt was confused after falling

## 2018-09-07 NOTE — ED Provider Notes (Signed)
Medical screening examination/treatment/procedure(s) were conducted as a shared visit with non-physician practitioner(s) and myself.  I personally evaluated the patient during the encounter.  EKG Interpretation  Date/Time:  Friday September 07 2018 09:33:36 EDT Ventricular Rate:  51 PR Interval:    QRS Duration: 135 QT Interval:  477 QTC Calculation: 440 R Axis:   79 Text Interpretation:  Sinus bradycardia Non-specific intra-ventricular conduction delay Confirmed by Raeford Razor [phone]) on 09/07/2018 10:11:51 AM  67 year old male presenting after what sounds like a syncopal event.  He was in his bathroom in his usual state of health remembers sneezing.  The next thing he remembers is waking up floor.  He did strike his head.  Neuro exam is nonfocal. CT showing a small amount of subarachnoid blood which is likely traumatic.  He is on Coumadin.  This will be reversed.  Vitamin K and Kcentra.  Noted to be bradycardic.  He reports that his Coreg was recently reduced secondary to dizziness.  He reports that he lost approximately 100 pounds since January.  This is fantastic, but his medications may need to be titrated further.  CRITICAL CARE Performed by: Raeford Razor Total critical care time: 35 minutes Critical care time was exclusive of separately billable procedures and treating other patients. Critical care was necessary to treat or prevent imminent or life-threatening deterioration. Critical care was time spent personally by me on the following activities: development of treatment plan with patient and/or surrogate as well as nursing, discussions with consultants, evaluation of patient's response to treatment, examination of patient, obtaining history from patient or surrogate, ordering and performing treatments and interventions, ordering and review of laboratory studies, ordering and review of radiographic studies, pulse oximetry and re-evaluation of patient's condition.    Raeford Razor,  MD 09/08/18 1213

## 2018-09-07 NOTE — ED Notes (Signed)
Will draw PT/INR once pt returns from xray

## 2018-09-07 NOTE — ED Notes (Signed)
Dr Tat at bedside, wife present.

## 2018-09-07 NOTE — ED Provider Notes (Signed)
[facility] EMERGENCY DEPARTMENT Provider Note   CSN: [id number] Arrival date & time: 09/07/18  8299     History   Chief Complaint Chief Complaint  Patient presents with  . Fall    HPI Mitchell Parker. is a 67 y.o. male with a history as outlined below, most significant for COPD, hypertension, nonischemic cardiomyopathy, diet controlled diabetes, chronic kidney disease, DVT on chronic Coumadin and is 10 months out from gastric bypass surgery (has lost 100 lbs)  presenting with fall associated with syncope.  He was in his bathroom this morning, had just gotten out of bed, was blowing his nose when he had a syncopal event falling and hitting the back of his head on the edge of the tub.  He denies any symptoms prior to this event including no chest pain, shortness of breath, nausea, vomiting, headache or abdominal pain.  He does report dizziness and lightheadedness since the event.  He has been having episodes of feeling lightheaded and his Coreg was recently reduced from 12.5 to 6.25 mg twice daily due to hypotension and lightheadedness.  He also has complaint of left hip pain since the fall.  He has been able to ambulate since the event, came in by private vehicle.  He has had no treatment prior to arrival.  He has had no recent illnesses, but wife endorses he has been having increased sinus and nasal congestion symptoms.  The history is provided by the patient and the spouse.  Fall  Associated symptoms include headaches. Pertinent negatives include no chest pain, no abdominal pain and no shortness of breath.    Past Medical History:  Diagnosis Date  . CKD (chronic kidney disease) stage 3, GFR 30-59 ml/min (HCC)   . COPD (chronic obstructive pulmonary disease) (HCC)   . Essential hypertension   . Gallstones   . Gout   . History of DVT (deep vein thrombosis)   . Kidney stones   . Lumbar disc disease   . Neuropathy   . Nonischemic cardiomyopathy (HCC)    Minor coronary  atherosclerosis at cardiac catheterization 2003, LVEF initially 20% with normalization  . Obesity   . RA (rheumatoid arthritis) (HCC)   . Sleep apnea   . Type 2 diabetes mellitus [facility])     Patient Active Problem List   Diagnosis Date Noted  . History of colonic polyps 04/10/2017  . Chronic anticoagulation 04/10/2017  . Dizziness 10/08/2012  . HTN (hypertension), malignant 10/08/2012  . UTI (lower urinary tract infection) 05/23/2012  . Hypokalemia 05/23/2012  . Vomiting 05/23/2012  . 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 Pulmonary embolism (HCC) 04/19/2012  . DVT (deep venous thrombosis) (HCC) 04/19/2012  . Syncope and collapse 04/17/2012  . Dehydration 04/17/2012  . Chronic diarrhea 04/17/2012  . Nausea and vomiting 04/17/2012  . Elevated troponin 04/17/2012  . Abnormal ECG 04/17/2012  . Chronic renal insufficiency 04/17/2012  . Cholelithiases 01/16/2012  . Obesity, morbid (HCC) 01/16/2012  . CHF (congestive heart failure) (HCC) 01/16/2012  . COPD (chronic obstructive pulmonary disease) (HCC) 01/16/2012  . OSA on CPAP 01/16/2012  . Diabetes mellitus (HCC) 01/16/2012  . Cholelithiasis with cholecystitis 01/04/2012  . Gout 01/04/2012  . Hypertension 01/04/2012  . Obesity, morbid (more than 100 lbs over ideal weight or BMI > 40) (HCC) 01/04/2012  . Piriformis syndrome     Past Surgical History:  Procedure Laterality Date  . CHOLECYSTECTOMY  01/18/2012   Procedure: LAPAROSCOPIC CHOLECYSTECTOMY WITH INTRAOPERATIVE CHOLANGIOGRAM;  Surgeon: Maisie Fus A. Cornett, MD;  Location: [facility] ORS;  Service: General;  Laterality: N/A;  . PILONIDAL CYST EXCISION  1997  . UMBILICAL HERNIA REPAIR    . VARICOSE VEIN SURGERY     left leg        Home Medications    Prior to Admission medications   Medication Sig Start Date End Date Taking? Authorizing Provider  albuterol-ipratropium (COMBIVENT) 18-103 MCG/ACT inhaler Inhale 2 puffs into the lungs every 6 (six) hours as needed. Wheezing   Yes  [provider]  allopurinol (ZYLOPRIM) 300 MG tablet Take 300 mg by mouth every morning.   Yes [provider]  aspirin 81 MG tablet Take 1 tablet (81 mg total) by mouth daily. 10/09/12  Yes Short, Thea Silversmith, MD  atorvastatin (LIPITOR) 80 MG tablet Take 80 mg by mouth.   Yes [provider]  calcitRIOL (ROCALTROL) 0.25 MCG capsule Take 0.25 mcg by mouth daily with breakfast.    Yes [provider]  calcium carbonate (OSCAL) 1500 (600 Ca) MG TABS tablet Take 600 mg of elemental calcium by mouth 2 (two) times daily with a meal.   Yes [provider]  carvedilol (COREG) 6.25 MG tablet Take 6.25 mg by mouth 2 (two) times daily with a meal.   Yes [provider]  Colchicine 0.6 MG CAPS Take 0.6 mg by mouth daily.   Yes [provider]  Cyanocobalamin (VITAMIN B 12 PO) Take by mouth.   Yes [provider]  DULoxetine HCl (CYMBALTA PO) Take 30 mg by mouth.    Yes [provider]  furosemide (LASIX) 40 MG tablet Take 80 mg by mouth 2 (two) times daily.    Yes [provider]  gabapentin (NEURONTIN) 300 MG capsule Take 300 mg by mouth 2 (two) times daily.     Yes [provider]  HYDROXYCHLOROQUINE SULFATE PO Take 300 mg by mouth 2 (two) times daily.   Yes [provider]  lisinopril (PRINIVIL,ZESTRIL) 5 MG tablet Take 5 mg by mouth daily.   Yes [provider]  metoCLOPramide (REGLAN) 5 MG tablet Take 1 tablet (5 mg total) by mouth 4 (four) times daily. 05/27/12  Yes Dorothea Ogle, MD  Multiple Vitamin (MULTIVITAMIN) capsule Take 1 capsule by mouth daily.   Yes [provider]  tamsulosin (FLOMAX) 0.4 MG CAPS capsule Take 0.4 mg by mouth.   Yes [provider]  warfarin (COUMADIN) 2.5 MG tablet Take 2.5 mg by mouth daily. Taken with 5mg  tab to equal 7.5mg  daily.   Yes [provider]  warfarin (COUMADIN) 5 MG tablet Take 5 mg by mouth daily. Taken with 2.5mg  daily  to equal 7.5mg .   Yes [provider]  insulin aspart (NOVOLOG) 100 UNIT/ML injection Inject 50 Units into the skin 3 (three) times daily before meals.    [provider]  insulin NPH (HUMULIN N,NOVOLIN N) 100 UNIT/ML injection Inject 90 Units into the skin 2 (two) times daily.     [provider]  pantoprazole (PROTONIX) 40 MG tablet Take 40 mg by mouth daily. 07/05/18   [provider]    Family History Family History  Problem Relation Age of Onset  . Hypertension Mother   . Stroke Mother   . Diabetes Father   . Hypertension Father   . Heart disease Father   . Diabetes Brother   . Colon cancer Neg Hx     Social History Social History   Tobacco Use  . Smoking status: Former Smoker  Types: Cigarettes    Last attempt to quit: 12/26/1994    Years since quitting: 23.7  . Smokeless tobacco: Never Used  Substance Use Topics  . Alcohol use: No  . Drug use: No     Allergies   Ciprofloxacin; Contrast media [iodinated diagnostic agents]; and Levofloxacin [levofloxacin]   Review of Systems Review of Systems  Constitutional: Negative for appetite change and fever.  HENT: Positive for congestion and rhinorrhea. Negative for sore throat.   Eyes: Negative.  Negative for photophobia and visual disturbance.  Respiratory: Negative for chest tightness and shortness of breath.   Cardiovascular: Negative for chest pain.  Gastrointestinal: Negative for abdominal pain and nausea.  Genitourinary: Negative.   Musculoskeletal: Positive for arthralgias. Negative for joint swelling and neck pain.  Skin: Negative.  Negative for rash and wound.  Neurological: Positive for dizziness, light-headedness and headaches. Negative for weakness and numbness.  Psychiatric/Behavioral: Negative.      Physical Exam Updated Vital Signs BP (!) 194/61   Pulse (!) 47   Temp 98.3 F (36.8 C) (Oral)   Resp 13   Ht 6' (1.829 m)   Wt 113.4 kg   SpO2 100%   BMI 33.91  kg/m   Physical Exam  Constitutional: He appears well-developed and well-nourished.  HENT:  Head: Normocephalic. Head is with contusion.    Eyes: Pupils are equal, round, and reactive to light. Conjunctivae and EOM are normal.  Neck: Normal range of motion. No spinous process tenderness present. No edema, no erythema and normal range of motion present.  Cardiovascular: Normal rate, regular rhythm, normal heart sounds and intact distal pulses.  Pulmonary/Chest: Effort normal and breath sounds normal. He has no wheezes.  Abdominal: Soft. Bowel sounds are normal. There is no tenderness.  Musculoskeletal: Normal range of motion.       Left hip: He exhibits tenderness.  Brawny discoloration bilateral anterior lower extremities.  Neurological: He is alert. He has normal strength. No cranial nerve deficit or sensory deficit. He exhibits normal muscle tone. GCS eye subscore is 4. GCS verbal subscore is 5. GCS motor subscore is 6.  Moving all 4 extremities.  Equal grip strength.   Skin: Skin is warm and dry.  Psychiatric: He has a normal mood and affect.  Nursing note and vitals reviewed.    ED Treatments / Results  Labs (all labs ordered are listed, but only abnormal results are displayed) Labs Reviewed  BASIC METABOLIC PANEL - Abnormal; Notable for the following components:      Result Value   CO2 33 (*)    Glucose, Bld 118 (*)    BUN 26 (*)    Calcium 8.8 (*)    All other components within normal limits  CBC WITH DIFFERENTIAL/PLATELET - Abnormal; Notable for the following components:   RBC 4.02 (*)    Hemoglobin 12.7 (*)    HCT 37.4 (*)    Platelets 90 (*)    All other components within normal limits  PROTIME-INR - Abnormal; Notable for the following components:   Prothrombin Time 23.5 (*)    All other components within normal limits  CBG MONITORING, ED - Abnormal; Notable for the following components:   Glucose-Capillary 115 (*)    All other components within normal limits    TROPONIN I  PROTIME-INR  PROTIME-INR    EKG EKG Interpretation  Date/Time:  Friday September 07 2018 09:33:36 EDT Ventricular Rate:  51 PR Interval:    QRS Duration: 135 QT Interval:  477 QTC  Calculation: 440 R Axis:   79 Text Interpretation:  Sinus bradycardia Non-specific intra-ventricular conduction delay Confirmed by Raeford Razor [phone]) on 09/07/2018 10:14:01 AM   Radiology Ct Head Wo Contrast  Result Date: 09/07/2018 CLINICAL DATA:  Fall, hit back of head. EXAM: CT HEAD WITHOUT CONTRAST CT CERVICAL SPINE WITHOUT CONTRAST TECHNIQUE: Multidetector CT imaging of the head and cervical spine was performed following the standard protocol without intravenous contrast. Multiplanar CT image reconstructions of the cervical spine were also generated. COMPARISON:  07/12/2012 FINDINGS: CT HEAD FINDINGS Brain: Small amount of subarachnoid blood noted in the high left frontal sulci and possibly anterior right frontal sulci. No intraparenchymal hemorrhage. No mass effect or midline shift. No hydrocephalus. Vascular: No hyperdense vessel or unexpected calcification. Skull: No acute calvarial abnormality. Sinuses/Orbits: Visualized paranasal sinuses and mastoids clear. Orbital soft tissues unremarkable. Other: None CT CERVICAL SPINE FINDINGS Alignment: Normal Skull base and vertebrae: No acute fracture. No primary bone lesion or focal pathologic process. Soft tissues and spinal canal: No prevertebral fluid or swelling. No visible canal hematoma. Disc levels:  Maintained Upper chest: Negative Other: No acute findings IMPRESSION: Small amount of subarachnoid hemorrhage in the left frontal and possible anterior right frontal regions. No intraparenchymal hemorrhage, hydrocephalus or midline shift. No acute bony abnormality in the cervical spine. These results were called by telephone at the time of interpretation on 09/07/2018 at 9:38 am to Dr. Juleen China , who verbally acknowledged these results. Electronically  Signed   By: Charlett Nose M.D.   On: 09/07/2018 09:39   Ct Cervical Spine Wo Contrast  Result Date: 09/07/2018 CLINICAL DATA:  Fall, hit back of head. EXAM: CT HEAD WITHOUT CONTRAST CT CERVICAL SPINE WITHOUT CONTRAST TECHNIQUE: Multidetector CT imaging of the head and cervical spine was performed following the standard protocol without intravenous contrast. Multiplanar CT image reconstructions of the cervical spine were also generated. COMPARISON:  07/12/2012 FINDINGS: CT HEAD FINDINGS Brain: Small amount of subarachnoid blood noted in the high left frontal sulci and possibly anterior right frontal sulci. No intraparenchymal hemorrhage. No mass effect or midline shift. No hydrocephalus. Vascular: No hyperdense vessel or unexpected calcification. Skull: No acute calvarial abnormality. Sinuses/Orbits: Visualized paranasal sinuses and mastoids clear. Orbital soft tissues unremarkable. Other: None CT CERVICAL SPINE FINDINGS Alignment: Normal Skull base and vertebrae: No acute fracture. No primary bone lesion or focal pathologic process. Soft tissues and spinal canal: No prevertebral fluid or swelling. No visible canal hematoma. Disc levels:  Maintained Upper chest: Negative Other: No acute findings IMPRESSION: Small amount of subarachnoid hemorrhage in the left frontal and possible anterior right frontal regions. No intraparenchymal hemorrhage, hydrocephalus or midline shift. No acute bony abnormality in the cervical spine. These results were called by telephone at the time of interpretation on 09/07/2018 at 9:38 am to Dr. Juleen China , who verbally acknowledged these results. Electronically Signed   By: Charlett Nose M.D.   On: 09/07/2018 09:39   Dg Hip Unilat W Or Wo Pelvis 2-3 Views Left  Result Date: 09/07/2018 CLINICAL DATA:  Chronic left hip pain. EXAM: DG HIP (WITH OR WITHOUT PELVIS) 2-3V LEFT COMPARISON:  None. FINDINGS: There is no evidence of hip fracture or dislocation. There is no evidence of arthropathy or  other focal bone abnormality. IMPRESSION: Normal left hip. Electronically Signed   By: Lupita Raider, M.D.   On: 09/07/2018 09:24    Procedures Procedures (including critical care time)  Medications Ordered in ED Medications  lisinopril (PRINIVIL,ZESTRIL) tablet 5 mg (has no administration  in time range)  acetaminophen (TYLENOL) tablet 1,000 mg (1,000 mg Oral Given 09/07/18 0938)  ondansetron (ZOFRAN) injection 4 mg (4 mg Intravenous Given 09/07/18 1049)  prothrombin complex conc human (KCENTRA) IVPB 1,545 Units (0 Units Intravenous Stopped 09/07/18 1133)  phytonadione (VITAMIN K) 10 mg in dextrose 5 % 50 mL IVPB ( Intravenous Stopped 09/07/18 1208)     Initial Impression / Assessment and Plan / ED Course  I have reviewed the triage vital signs and the nursing notes.  Pertinent labs & imaging results that were available during my care of the patient were reviewed by me and considered in my medical decision making (see chart for details).     Pt with syncope, possibly related to hypotension/bradycardia with head injury with small SAH per CT imaging.  On coumadin for h/o PE/dvt.  No neuro deficits. Spoke with Dr. Maurice Small with neurosurgery who reviewed chart and imaging.  Pt can stay at [facility] since he has no neuro deficits.  Call to hospitalists for admission.   Final Clinical Impressions(s) / ED Diagnoses   Final diagnoses:  Syncope, unspecified syncope type  SAH (subarachnoid hemorrhage) [facility])    ED Discharge Orders    None       Victoriano Lain 09/07/18 1214    Raeford Razor, MD 09/07/18 1340

## 2018-09-08 ENCOUNTER — Observation Stay (HOSPITAL_COMMUNITY): Payer: Medicare HMO

## 2018-09-08 DIAGNOSIS — S065XAA Traumatic subdural hemorrhage with loss of consciousness status unknown, initial encounter: Secondary | ICD-10-CM

## 2018-09-08 DIAGNOSIS — S065X9A Traumatic subdural hemorrhage with loss of consciousness of unspecified duration, initial encounter: Secondary | ICD-10-CM | POA: Diagnosis not present

## 2018-09-08 DIAGNOSIS — D696 Thrombocytopenia, unspecified: Secondary | ICD-10-CM

## 2018-09-08 DIAGNOSIS — I609 Nontraumatic subarachnoid hemorrhage, unspecified: Secondary | ICD-10-CM | POA: Diagnosis not present

## 2018-09-08 DIAGNOSIS — R55 Syncope and collapse: Secondary | ICD-10-CM | POA: Diagnosis not present

## 2018-09-08 DIAGNOSIS — I619 Nontraumatic intracerebral hemorrhage, unspecified: Secondary | ICD-10-CM | POA: Diagnosis not present

## 2018-09-08 DIAGNOSIS — I10 Essential (primary) hypertension: Secondary | ICD-10-CM | POA: Diagnosis not present

## 2018-09-08 HISTORY — DX: Traumatic subdural hemorrhage with loss of consciousness of unspecified duration, initial encounter: S06.5X9A

## 2018-09-08 HISTORY — DX: Traumatic subdural hemorrhage with loss of consciousness status unknown, initial encounter: S06.5XAA

## 2018-09-08 LAB — GLUCOSE, CAPILLARY
Glucose-Capillary: 92 mg/dL (ref 70–99)
Glucose-Capillary: 113 mg/dL — ABNORMAL HIGH (ref 70–99)
Glucose-Capillary: 145 mg/dL — ABNORMAL HIGH (ref 70–99)
Glucose-Capillary: 119 mg/dL — ABNORMAL HIGH (ref 70–99)

## 2018-09-08 LAB — BASIC METABOLIC PANEL
Anion gap: 4 — ABNORMAL LOW (ref 5–15)
BUN: 21 mg/dL (ref 8–23)
CO2: 33 mmol/L — ABNORMAL HIGH (ref 22–32)
Calcium: 8.2 mg/dL — ABNORMAL LOW (ref 8.9–10.3)
Chloride: 105 mmol/L (ref 98–111)
Creatinine, Ser: 1.06 mg/dL (ref 0.61–1.24)
GFR calc Af Amer: >60 mL/min (ref >60)
GFR calc non Af Amer: >60 mL/min (ref >60)
Glucose, Bld: 95 mg/dL (ref 70–99)
Potassium: 3.9 mmol/L (ref 3.5–5.1)
Sodium: 142 mmol/L (ref 135–145)

## 2018-09-08 LAB — PROTIME-INR
INR: 1.17
INR: 1.13
INR: 1.07
Prothrombin Time: 14.8 seconds (ref 11.4–15.2)
Prothrombin Time: 14.4 seconds (ref 11.4–15.2)
Prothrombin Time: 13.8 seconds (ref 11.4–15.2)

## 2018-09-08 LAB — CBC
HCT: 38.1 % — ABNORMAL LOW (ref 39.0–52.0)
Hemoglobin: 12.7 g/dL — ABNORMAL LOW (ref 13.0–17.0)
MCH: 31.3 pg (ref 26.0–34.0)
MCHC: 33.3 g/dL (ref 30.0–36.0)
MCV: 93.8 fL (ref 78.0–100.0)
Platelets: 85 10*3/uL — ABNORMAL LOW (ref 150–400)
RBC: 4.06 MIL/uL — ABNORMAL LOW (ref 4.22–5.81)
RDW: 14.9 % (ref 11.5–15.5)
WBC: 4.7 10*3/uL (ref 4.0–10.5)

## 2018-09-08 LAB — HEPATITIS B SURFACE ANTIGEN: Hepatitis B Surface Ag: NEGATIVE

## 2018-09-08 LAB — HIV ANTIBODY (ROUTINE TESTING W REFLEX): HIV Screen 4th Generation wRfx: NONREACTIVE

## 2018-09-08 LAB — HEPATITIS C ANTIBODY: HCV Ab: <0.1 s/co ratio (ref 0.0–0.9)

## 2018-09-08 MED ORDER — POTASSIUM CHLORIDE IN NACL 20-0.9 MEQ/L-% IV SOLN
INTRAVENOUS | Status: DC
  Administered 2018-09-08 – 2018-09-09 (×2): via INTRAVENOUS

## 2018-09-08 NOTE — Progress Notes (Signed)
PROGRESS NOTE  Mitchell Parker MVE:[id number] DOB: Oct 05, 1951 DOA: 09/07/2018 PCP: Toma Deiters, MD  Brief History:   67 y.o. male with medical history of COPD, nonischemic cardia myopathy, rheumatoid arthritis, OSA, diet-controlled diabetes mellitus, hypertension, PE/DVT presented after a syncopal episode.  The patient was getting up to go to the bathroom in the morning of 09/07/2018.  The patient went to blow his nose, and the next thing he remembered was waking up on the floor.  The patient's wife heard the patient fall, and came to check on the patient.  She stated that it was a matter of 1 minute from the time of the fall until she arrived.  The patient was awake and alert and conversant when she saw him.  They came to the emergency department by private vehicle.  The patient fell and hit the back of his head against the walk-in shower.  He denies any other injuries.  He had some pain in his back where he hit his head.  The patient had denied any prodromal symptoms including chest discomfort, shortness of breath, palpitations, aura.  He had denied any recent dyspnea on exertion or decreased exercise tolerance.  His carvedilol was decreased to 625 mg twice daily with improvement of his dizziness symptoms.  He denied any nausea, vomiting, bowel or bladder incontinence. In the emergency department, the patient was afebrile hemodynamically stable saturating 100% room air.  BMP, CBC were essentially unremarkable except for platelets of 90,000.  The patient was given vitamin K and Kcentra.  The case was discussed with neurosurgery, Dr. Maurice Small, who felt pt could be managed at [facility]  Assessment/Plan: Subarachnoid hemorrhage and Subdural Hematoma -Secondary to syncopal episode resulting in a fall and hitting his head -Patient presently has no focal neurologic deficit -Case was discussed  neurosurgery, Dr. Maurice Small, who felt pt could be managed at [facility] -Monitor clinically over the next 24  hours -09/08/18--repeat CT brain--interval small left SDH, 4 mm in thickness -09/08/18--case discussed with neurosurgery--Dr. Cherie Ouch" SDH represents redistribution of SAH blood products -due to risk of new bleed, monitor for another 24 hours  Syncope -Appears to have been vasovagal vs DM dysautonomia -Cardiology evaluation -05/07/2018 echo EF 50-55%, diffuse HK, trivial MR -Monitor on telemetry -May 2019 Holter monitor--sinus rhythm with occasional PACs and PVCs  Orthostatic Hypotension -IVF resuscitation -recheck orthostatics -may need to tolerate higher baseline BP -decrease coreg to 3.125 mg bid, partly due to bradycardia -due to persistent dizziness, continue IVF, monitor another 24 hours  Essential hypertension -Continue carvedilol and lisinopril -decrease dose carvedilol  Diabetes mellitus type 2 with neuropathy -Continue duloxetine and gabapentin -The patient has lost >105 lbs since gastric bypass -no longer on insulin, now diet controlled -09/07/18 A1C - 6.0  Rheumatoid arthritis -Continue Plaquenil  Hyperlipidemia -Continue statin  Thrombocytopenia -check B12--2235 -check folate--28.9 -02/15/17 CT abd did not suggest cirrhosis or splenomegaly -monitor  PE/DVT -clinical hx suggests pt had a provoked event as it occurred within one week of hernia surgery -pt has taken warfarin 10 year -no prior or subsequent VTE events; no hx of thrombophilia -do not plan to restart warfarin--discussed with wife and patient who agree with plan   Disposition Plan:   Home 08/30/18 if stable  Family Communication:   Spouse updated at bedside--Total time spent 35 minutes.  Greater than 50% spent face to face counseling and coordinating care.   Consultants:  neurosurgery  Code Status:  FULL  DVT Prophylaxis:  SCDs   Procedures: As Listed in Progress Note Above  Antibiotics: None    Subjective: Pt still feeling dizziness with standing.  Denies f, c cp,  sob, n/v/d.  C/o bifrontal headache.  No abd pain or dysuria  Objective: Vitals:   09/07/18 2137 09/07/18 2157 09/08/18 0600 09/08/18 1313  BP: 127/60  135/61 107/79  Pulse: (!) 53 (!) 53 (!) 56 84  Resp: 20 16 20 18   Temp: 98.9 F (37.2 C)  98.5 F (36.9 C) 98.9 F (37.2 C)  TempSrc: Oral  Oral Oral  SpO2: 98% 96% 99% 98%  Weight:      Height:        Intake/Output Summary (Last 24 hours) at 09/08/2018 1621 Last data filed at 09/08/2018 1400 Gross per 24 hour  Intake 2447.93 ml  Output 1950 ml  Net 497.93 ml   Weight change:  Exam:   General:  Pt is alert, follows commands appropriately, not in acute distress  HEENT: No icterus, No thrush, No neck mass, [place]/AT  Cardiovascular: RRR, S1/S2, no rubs, no gallops  Respiratory: CTA bilaterally, no wheezing, no crackles, no rhonchi  Abdomen: Soft/+BS, non tender, non distended, no guarding  Extremities: No edema, No lymphangitis, No petechiae, No rashes, no synovitis  -Neuro:  CN II-XII intact, strength 4/5 in RUE, RLE, strength 4/5 LUE, LLE; sensation intact bilateral; no dysmetria; babinski equivocal     Data Reviewed: I have personally reviewed following labs and imaging studies Basic Metabolic Panel: Recent Labs  Lab 09/07/18 0859 09/08/18 0652  NA 142 142  K 3.6 3.9  CL 102 105  CO2 33* 33*  GLUCOSE 118* 95  BUN 26* 21  CREATININE 1.13 1.06  CALCIUM 8.8* 8.2*   Liver Function Tests: No results for input(s): AST, ALT, ALKPHOS, BILITOT, PROT, ALBUMIN in the last 168 hours. No results for input(s): LIPASE, AMYLASE in the last 168 hours. No results for input(s): AMMONIA in the last 168 hours. Coagulation Profile: Recent Labs  Lab 09/07/18 1301 09/07/18 1822 09/08/18 0056 09/08/18 0652 09/08/18 1316  INR 1.26 1.23 1.17 1.13 1.07   CBC: Recent Labs  Lab 09/07/18 0859 09/08/18 0652  WBC 4.6 4.7  NEUTROABS 2.8  --   HGB 12.7* 12.7*  HCT 37.4* 38.1*  MCV 93.0 93.8  PLT 90* 85*   Cardiac  Enzymes: Recent Labs  Lab 09/07/18 0859  TROPONINI <0.03   BNP: Invalid input(s): POCBNP CBG: Recent Labs  Lab 09/07/18 1625 09/07/18 2138 09/08/18 0729 09/08/18 1124 09/08/18 1607  GLUCAP 134* 109* 92 113* 145*   HbA1C: Recent Labs    09/07/18 1407  HGBA1C 6.0*   Urine analysis:    Component Value Date/Time   COLORURINE YELLOW 10/08/2012 1053   APPEARANCEUR CLEAR 10/08/2012 1053   LABSPEC 1.014 10/08/2012 1053   PHURINE 8.0 10/08/2012 1053   GLUCOSEU 100 (A) 10/08/2012 1053   HGBUR SMALL (A) 10/08/2012 1053   BILIRUBINUR NEGATIVE 10/08/2012 1053   KETONESUR NEGATIVE 10/08/2012 1053   PROTEINUR 30 (A) 10/08/2012 1053   UROBILINOGEN 0.2 10/08/2012 1053   NITRITE NEGATIVE 10/08/2012 1053   LEUKOCYTESUR NEGATIVE 10/08/2012 1053   Sepsis Labs: @LABRCNTIP (procalcitonin:4,lacticidven:4) )No results found for this or any previous visit (from the past 240 hour(s)).   Scheduled Meds: . allopurinol  300 mg Oral q morning - 10a  . atorvastatin  80 mg Oral q1800  . calcitRIOL  0.25 mcg Oral Q breakfast  . calcium carbonate  600 mg of elemental calcium  Oral BID WC  . carvedilol  6.25 mg Oral BID WC  . DULoxetine  30 mg Oral Daily  . furosemide  80 mg Oral BID  . gabapentin  300 mg Oral BID  . hydroxychloroquine  300 mg Oral BID  . insulin aspart  0-9 Units Subcutaneous TID WC  . lisinopril  5 mg Oral Daily  . multivitamin with minerals  1 tablet Oral Daily  . pantoprazole  40 mg Oral Daily  . tamsulosin  0.4 mg Oral QPC breakfast   Continuous Infusions:  Procedures/Studies: Ct Angio Head W Or Wo Contrast  Result Date: 09/07/2018 CLINICAL DATA:  Subarachnoid hemorrhage rim follow-up. Fall. Trauma to back of head. EXAM: CT ANGIOGRAPHY HEAD TECHNIQUE: Multidetector CT imaging of the head was performed using the standard protocol during bolus administration of intravenous contrast. Multiplanar CT image reconstructions and MIPs were obtained to evaluate the vascular  anatomy. CONTRAST:  <See Chart> ISOVUE-370 IOPAMIDOL (ISOVUE-370) INJECTION 76% COMPARISON:  CT head without contrast 09/07/2018 FINDINGS: CTA HEAD Anterior circulation: Internal carotid arteries are within normal limits from the high cervical segments through the ICA termini bilaterally. The A1 and M1 segments normal. Anterior communicating artery is patent. MCA bifurcations are within normal limits. No aneurysm is present. Posterior circulation: The left vertebral artery is the dominant vessel. PICA origins are visualized and normal. The basilar artery is. Both posterior cerebral arteries originate from the basilar tip the PCA branch vessels are within normal limits. Venous sinuses: Dural sinuses are patent. The straight sinus deep cerebral veins are intact. Cortical veins are normal. Anatomic variants: None Delayed phase: Postcontrast images demonstrate no pathologic enhancement. Subarachnoid hemorrhage over the left greater than right frontal convexity is stable. IMPRESSION: 1. Normal variant CTA circle-of-Willis without significant proximal stenosis, aneurysm, or branch vessel occlusion. 2. Subarachnoid hemorrhage is stable.  This is likely posttraumatic. Electronically Signed   By: Marin Roberts M.D.   On: 09/07/2018 13:24   Ct Head Wo Contrast  Result Date: 09/08/2018 CLINICAL DATA:  Follow-up intracranial hemorrhage EXAM: CT HEAD WITHOUT CONTRAST TECHNIQUE: Contiguous axial images were obtained from the base of the skull through the vertex without intravenous contrast. COMPARISON:  Yesterday FINDINGS: Brain: Trace subarachnoid hemorrhage seen along the high right cerebral convexity. Parasagittal right posterior frontal blood was not seen previously, but may have redistributed. Left posterior frontal blood seen on prior is not visualized today. Overall subarachnoid hemorrhage volume is stable. There is a newly apparent 4 mm thick subdural hemorrhage along the left tentorium. No mass effect,  infarct, or hydrocephalus. Vascular: No hyperdense vessel or unexpected calcification. Skull: Negative for fracture Sinuses/Orbits: Negative Other: These results were called by telephone at the time of interpretation on 09/08/2018 at 1:30 pm to Dr. Onalee Hua Tashauna Caisse , who verbally acknowledged these results. IMPRESSION: 1. Interval small left tentorial subdural hematoma only measuring 4 mm in thickness. No related mass effect. 2. Trace subarachnoid hemorrhage with presumed redistribution since yesterday. Electronically Signed   By: Marnee Spring M.D.   On: 09/08/2018 13:34   Ct Head Wo Contrast  Result Date: 09/07/2018 CLINICAL DATA:  Fall, hit back of head. EXAM: CT HEAD WITHOUT CONTRAST CT CERVICAL SPINE WITHOUT CONTRAST TECHNIQUE: Multidetector CT imaging of the head and cervical spine was performed following the standard protocol without intravenous contrast. Multiplanar CT image reconstructions of the cervical spine were also generated. COMPARISON:  07/12/2012 FINDINGS: CT HEAD FINDINGS Brain: Small amount of subarachnoid blood noted in the high left frontal sulci and possibly anterior right  frontal sulci. No intraparenchymal hemorrhage. No mass effect or midline shift. No hydrocephalus. Vascular: No hyperdense vessel or unexpected calcification. Skull: No acute calvarial abnormality. Sinuses/Orbits: Visualized paranasal sinuses and mastoids clear. Orbital soft tissues unremarkable. Other: None CT CERVICAL SPINE FINDINGS Alignment: Normal Skull base and vertebrae: No acute fracture. No primary bone lesion or focal pathologic process. Soft tissues and spinal canal: No prevertebral fluid or swelling. No visible canal hematoma. Disc levels:  Maintained Upper chest: Negative Other: No acute findings IMPRESSION: Small amount of subarachnoid hemorrhage in the left frontal and possible anterior right frontal regions. No intraparenchymal hemorrhage, hydrocephalus or midline shift. No acute bony abnormality in the cervical  spine. These results were called by telephone at the time of interpretation on 09/07/2018 at 9:38 am to Dr. Juleen China , who verbally acknowledged these results. Electronically Signed   By: Charlett Nose M.D.   On: 09/07/2018 09:39   Ct Cervical Spine Wo Contrast  Result Date: 09/07/2018 CLINICAL DATA:  Fall, hit back of head. EXAM: CT HEAD WITHOUT CONTRAST CT CERVICAL SPINE WITHOUT CONTRAST TECHNIQUE: Multidetector CT imaging of the head and cervical spine was performed following the standard protocol without intravenous contrast. Multiplanar CT image reconstructions of the cervical spine were also generated. COMPARISON:  07/12/2012 FINDINGS: CT HEAD FINDINGS Brain: Small amount of subarachnoid blood noted in the high left frontal sulci and possibly anterior right frontal sulci. No intraparenchymal hemorrhage. No mass effect or midline shift. No hydrocephalus. Vascular: No hyperdense vessel or unexpected calcification. Skull: No acute calvarial abnormality. Sinuses/Orbits: Visualized paranasal sinuses and mastoids clear. Orbital soft tissues unremarkable. Other: None CT CERVICAL SPINE FINDINGS Alignment: Normal Skull base and vertebrae: No acute fracture. No primary bone lesion or focal pathologic process. Soft tissues and spinal canal: No prevertebral fluid or swelling. No visible canal hematoma. Disc levels:  Maintained Upper chest: Negative Other: No acute findings IMPRESSION: Small amount of subarachnoid hemorrhage in the left frontal and possible anterior right frontal regions. No intraparenchymal hemorrhage, hydrocephalus or midline shift. No acute bony abnormality in the cervical spine. These results were called by telephone at the time of interpretation on 09/07/2018 at 9:38 am to Dr. Juleen China , who verbally acknowledged these results. Electronically Signed   By: Charlett Nose M.D.   On: 09/07/2018 09:39   Dg Hip Unilat W Or Wo Pelvis 2-3 Views Left  Result Date: 09/07/2018 CLINICAL DATA:  Chronic left hip  pain. EXAM: DG HIP (WITH OR WITHOUT PELVIS) 2-3V LEFT COMPARISON:  None. FINDINGS: There is no evidence of hip fracture or dislocation. There is no evidence of arthropathy or other focal bone abnormality. IMPRESSION: Normal left hip. Electronically Signed   By: Lupita Raider, M.D.   On: 09/07/2018 09:24    Catarina Hartshorn, DO  [facility] Pager [phone]  If 7PM-7AM, please contact night-coverage [email or web address] Password [email or web address] 09/08/2018, 4:21 PM   LOS: 0 days

## 2018-09-09 DIAGNOSIS — N183 Chronic kidney disease, stage 3 (moderate): Secondary | ICD-10-CM | POA: Diagnosis present

## 2018-09-09 DIAGNOSIS — I951 Orthostatic hypotension: Secondary | ICD-10-CM

## 2018-09-09 DIAGNOSIS — R402142 Coma scale, eyes open, spontaneous, at arrival to emergency department: Secondary | ICD-10-CM | POA: Diagnosis not present

## 2018-09-09 DIAGNOSIS — Z87891 Personal history of nicotine dependence: Secondary | ICD-10-CM | POA: Diagnosis not present

## 2018-09-09 DIAGNOSIS — R55 Syncope and collapse: Secondary | ICD-10-CM | POA: Diagnosis not present

## 2018-09-09 DIAGNOSIS — R402252 Coma scale, best verbal response, oriented, at arrival to emergency department: Secondary | ICD-10-CM | POA: Diagnosis present

## 2018-09-09 DIAGNOSIS — E1140 Type 2 diabetes mellitus with diabetic neuropathy, unspecified: Secondary | ICD-10-CM | POA: Diagnosis not present

## 2018-09-09 DIAGNOSIS — R402362 Coma scale, best motor response, obeys commands, at arrival to emergency department: Secondary | ICD-10-CM | POA: Diagnosis not present

## 2018-09-09 DIAGNOSIS — W182XXA Fall in (into) shower or empty bathtub, initial encounter: Secondary | ICD-10-CM | POA: Diagnosis present

## 2018-09-09 DIAGNOSIS — E1122 Type 2 diabetes mellitus with diabetic chronic kidney disease: Secondary | ICD-10-CM | POA: Diagnosis not present

## 2018-09-09 DIAGNOSIS — I428 Other cardiomyopathies: Secondary | ICD-10-CM | POA: Diagnosis not present

## 2018-09-09 DIAGNOSIS — E785 Hyperlipidemia, unspecified: Secondary | ICD-10-CM | POA: Diagnosis not present

## 2018-09-09 DIAGNOSIS — Z7901 Long term (current) use of anticoagulants: Secondary | ICD-10-CM | POA: Diagnosis not present

## 2018-09-09 DIAGNOSIS — Y92002 Bathroom of unspecified non-institutional (private) residence single-family (private) house as the place of occurrence of the external cause: Secondary | ICD-10-CM | POA: Diagnosis not present

## 2018-09-09 DIAGNOSIS — I609 Nontraumatic subarachnoid hemorrhage, unspecified: Secondary | ICD-10-CM | POA: Diagnosis not present

## 2018-09-09 DIAGNOSIS — D696 Thrombocytopenia, unspecified: Secondary | ICD-10-CM | POA: Diagnosis not present

## 2018-09-09 DIAGNOSIS — J449 Chronic obstructive pulmonary disease, unspecified: Secondary | ICD-10-CM | POA: Diagnosis present

## 2018-09-09 DIAGNOSIS — S065X9A Traumatic subdural hemorrhage with loss of consciousness of unspecified duration, initial encounter: Secondary | ICD-10-CM | POA: Diagnosis not present

## 2018-09-09 DIAGNOSIS — Z881 Allergy status to other antibiotic agents status: Secondary | ICD-10-CM | POA: Diagnosis not present

## 2018-09-09 DIAGNOSIS — Z86711 Personal history of pulmonary embolism: Secondary | ICD-10-CM | POA: Diagnosis not present

## 2018-09-09 DIAGNOSIS — M069 Rheumatoid arthritis, unspecified: Secondary | ICD-10-CM | POA: Diagnosis not present

## 2018-09-09 DIAGNOSIS — G4733 Obstructive sleep apnea (adult) (pediatric): Secondary | ICD-10-CM | POA: Diagnosis present

## 2018-09-09 DIAGNOSIS — Z66 Do not resuscitate: Secondary | ICD-10-CM | POA: Diagnosis present

## 2018-09-09 DIAGNOSIS — Z86718 Personal history of other venous thrombosis and embolism: Secondary | ICD-10-CM | POA: Diagnosis not present

## 2018-09-09 DIAGNOSIS — Z79899 Other long term (current) drug therapy: Secondary | ICD-10-CM | POA: Diagnosis not present

## 2018-09-09 DIAGNOSIS — Z7982 Long term (current) use of aspirin: Secondary | ICD-10-CM | POA: Diagnosis not present

## 2018-09-09 DIAGNOSIS — I129 Hypertensive chronic kidney disease with stage 1 through stage 4 chronic kidney disease, or unspecified chronic kidney disease: Secondary | ICD-10-CM | POA: Diagnosis not present

## 2018-09-09 DIAGNOSIS — G901 Familial dysautonomia [Riley-Day]: Secondary | ICD-10-CM | POA: Diagnosis present

## 2018-09-09 HISTORY — DX: Orthostatic hypotension: I95.1

## 2018-09-09 LAB — GLUCOSE, CAPILLARY
Glucose-Capillary: 100 mg/dL — ABNORMAL HIGH (ref 70–99)
Glucose-Capillary: 116 mg/dL — ABNORMAL HIGH (ref 70–99)
Glucose-Capillary: 122 mg/dL — ABNORMAL HIGH (ref 70–99)
Glucose-Capillary: 157 mg/dL — ABNORMAL HIGH (ref 70–99)

## 2018-09-09 LAB — BASIC METABOLIC PANEL
Anion gap: 7 (ref 5–15)
BUN: 18 mg/dL (ref 8–23)
CO2: 29 mmol/L (ref 22–32)
Calcium: 8.1 mg/dL — ABNORMAL LOW (ref 8.9–10.3)
Chloride: 105 mmol/L (ref 98–111)
Creatinine, Ser: 0.99 mg/dL (ref 0.61–1.24)
GFR calc Af Amer: >60 mL/min (ref >60)
GFR calc non Af Amer: >60 mL/min (ref >60)
Glucose, Bld: 113 mg/dL — ABNORMAL HIGH (ref 70–99)
Potassium: 3.6 mmol/L (ref 3.5–5.1)
Sodium: 141 mmol/L (ref 135–145)

## 2018-09-09 LAB — PROTIME-INR
INR: 1.12
Prothrombin Time: 14.3 seconds (ref 11.4–15.2)

## 2018-09-09 LAB — CBC
HCT: 36.2 % — ABNORMAL LOW (ref 39.0–52.0)
Hemoglobin: 12.1 g/dL — ABNORMAL LOW (ref 13.0–17.0)
MCH: 31.7 pg (ref 26.0–34.0)
MCHC: 33.4 g/dL (ref 30.0–36.0)
MCV: 94.8 fL (ref 78.0–100.0)
Platelets: 91 10*3/uL — ABNORMAL LOW (ref 150–400)
RBC: 3.82 MIL/uL — ABNORMAL LOW (ref 4.22–5.81)
RDW: 14.9 % (ref 11.5–15.5)
WBC: 4.3 10*3/uL (ref 4.0–10.5)

## 2018-09-09 MED ORDER — POTASSIUM CHLORIDE IN NACL 20-0.9 MEQ/L-% IV SOLN
INTRAVENOUS | Status: AC
  Administered 2018-09-09: 18:00:00 via INTRAVENOUS

## 2018-09-09 MED ORDER — AMLODIPINE BESYLATE 5 MG PO TABS: 5.0000 mg | ORAL_TABLET | Freq: Every day | ORAL | Status: DC

## 2018-09-09 NOTE — Evaluation (Signed)
Physical Therapy Evaluation Patient Details Name: Mitchell GASPARINI Sr. MRN: [id number] DOB: 03-15-1951 Today's Date: 09/09/2018   History of Present Illness  Mitchell Parker. is a 67 y.o. male with medical history of COPD, nonischemic cardia myopathy, rheumatoid arthritis, OSA, diet-controlled diabetes mellitus, hypertension, PE/DVT presented after a syncopal episode.  The patient was getting up to go to the bathroom in the morning of 09/07/2018.  The patient went to blow his nose, and the next thing he remembered was waking up on the floor.  The patient's wife heard the patient fall, and came to check on the patient.  She stated that it was a matter of 1 minute from the time of the fall until she arrived.  The patient was awake and alert and conversant when she saw him.  They came to the emergency department by private vehicle.  The patient fell and hit the back of his head against the walk-in shower.  He denies any other injuries.  He had some pain in his back where he hit his head.  The patient had denied any prodromal symptoms including chest discomfort, shortness of breath, palpitations, aura.  He had denied any recent dyspnea on exertion or decreased exercise tolerance.  The patient has noted some dizziness after his gastric bypass in January 2019.  His carvedilol was decreased to 625 mg twice daily with improvement of his dizziness symptoms.  He denied any nausea, vomiting, bowel or bladder incontinence.    Clinical Impression  Patient was awake and alert upon entering room. Patient performed bed mobility with mod I. Patient performed transfers with mod I and verbal cueing to wait a minute before beginning to ambulate as patient has history of falling and OH. Patient ambulated 60 feet with supervision and indicated sufficient enough strength for safe stair negotiation, although not tested this date. Patient demonstrated some deficits in gait and therapist instructed patient that he should use cane to  help with some balance difficulties. Patient left sitting in bedside chair with alarm set and nursing staff notified. All needs met. Patient would benefit from continued skilled physical therapy while in hospital for further instruction on safe ambulation, balance, strengthening, and overall functional mobility.     Follow Up Recommendations Home health PT;Supervision/Assistance - 24 hour    Equipment Recommendations       Recommendations for Other Services       Precautions / Restrictions Precautions Precautions: Fall Restrictions Weight Bearing Restrictions: No      Mobility  Bed Mobility Overal bed mobility: Modified Independent             General bed mobility comments: Patient performed supine to sit at EOB with HOB slightly elevated to how his wedge would be at home with modified indepence.  Transfers Overall transfer level: Modified independent Equipment used: None             General transfer comment: Patient performed sit to stand from EOB without assistive device and with increased time  Ambulation/Gait Ambulation/Gait assistance: Supervision Gait Distance (Feet): 60 Feet Assistive device: None Gait Pattern/deviations: Decreased step length - right;Decreased step length - left;Decreased stride length Gait velocity: Below age-matched norm   General Gait Details: Patient ambulated with decreased gait velocity and also demonstrated very short steps. Patient was able to demonstrate marching steps and reported he felt that he could climb stairs if needed.   Stairs            Wheelchair Mobility    Modified  Rankin (Stroke Patients Only)       Balance Overall balance assessment: Modified Independent                                           Pertinent Vitals/Pain Pain Assessment: No/denies pain    Home Living Family/patient expects to be discharged to:: Private residence Living Arrangements: Spouse/significant  other Available Help at Discharge: Family;Available 24 hours/day Type of Home: House Home Access: Stairs to enter Entrance Stairs-Rails: Can reach both;Right;Left Entrance Stairs-Number of Steps: 4 Home Layout: One level Home Equipment: Cane - single point;Grab bars - tub/shower Additional Comments: Patient stated he uses the cane sometimes, but not usually    Prior Function Level of Independence: Independent with assistive device(s)               Hand Dominance        Extremity/Trunk Assessment   Upper Extremity Assessment Upper Extremity Assessment: Generalized weakness    Lower Extremity Assessment Lower Extremity Assessment: Generalized weakness    Cervical / Trunk Assessment Cervical / Trunk Assessment: Normal  Communication   Communication: No difficulties  Cognition Arousal/Alertness: Awake/alert Behavior During Therapy: WFL for tasks assessed/performed Overall Cognitive Status: Within Functional Limits for tasks assessed                                 General Comments: Able to correctly state DOB      General Comments General comments (skin integrity, edema, etc.): Noted dark purple-ish hue on bilateral calves, with patient reporting poor circulation in legs.     Exercises     Assessment/Plan    PT Assessment Patient needs continued PT services  PT Problem List Decreased strength;Decreased activity tolerance;Decreased balance;Decreased mobility       PT Treatment Interventions DME instruction;Gait training;Stair training;Functional mobility training;Therapeutic activities;Therapeutic exercise;Balance training;Neuromuscular re-education;Patient/family education    PT Goals (Current goals can be found in the Care Plan section)  Acute Rehab PT Goals Patient Stated Goal: Return home with HHPT.  PT Goal Formulation: With patient Time For Goal Achievement: 09/16/18 Potential to Achieve Goals: Good    Frequency Min 3X/week    Barriers to discharge        Co-evaluation               AM-PAC PT "6 Clicks" Daily Activity  Outcome Measure Difficulty turning over in bed (including adjusting bedclothes, sheets and blankets)?: None Difficulty moving from lying on back to sitting on the side of the bed? : None Difficulty sitting down on and standing up from a chair with arms (e.g., wheelchair, bedside commode, etc,.)?: None Help needed moving to and from a bed to chair (including a wheelchair)?: A Little Help needed walking in hospital room?: A Little Help needed climbing 3-5 steps with a railing? : A Little 6 Click Score: 21    End of Session Equipment Utilized During Treatment: Gait belt Activity Tolerance: Patient tolerated treatment well;No increased pain Patient left: in chair;with call bell/phone within reach;with chair alarm set;with SCD's reapplied Nurse Communication: Mobility status PT Visit Diagnosis: Unsteadiness on feet (R26.81);Other abnormalities of gait and mobility (R26.89);Muscle weakness (generalized) (M62.81);History of falling (Z91.81)    Time: 5997-7414 PT Time Calculation (min) (ACUTE ONLY): 22 min   Charges:   PT Evaluation $PT Eval Low Complexity: 1 Low  Clarene Critchley PT, DPT 9:37 AM, 09/09/18 [phone]

## 2018-09-09 NOTE — Progress Notes (Signed)
PROGRESS NOTE  Mitchell Parker VQM:[id number] DOB: 1951/07/29 DOA: 09/07/2018 PCP: Toma Deiters, MD  Brief History:  67 y.o.malewith medical history ofCOPD, nonischemic cardia myopathy, rheumatoid arthritis, OSA, diet-controlled diabetes mellitus, hypertension, PE/DVT presented after a syncopal episode. The patient was getting up to go to the bathroom in the morning of 09/07/2018. The patient went to blow his nose, and the next thing he remembered was waking up on the floor. The patient's wife heard the patient fall, and came to check on the patient. She stated that it was a matter of 1 minute from the time of the fall until she arrived. The patient was awake and alert and conversant when she saw him. They came to the emergency department by private vehicle. The patient fell and hit the back of his head against the walk-in shower. He denies any other injuries. He had some pain in his back where he hit his head. The patient had denied any prodromal symptoms including chest discomfort, shortness of breath, palpitations, aura. He had denied any recent dyspnea on exertion or decreased exercise tolerance. His carvedilol was decreased to 625 mg twice daily with improvement of his dizziness symptoms. He denied any nausea, vomiting, bowel or bladder incontinence. In the emergency department, the patient was afebrile hemodynamically stable saturating 100% room air. BMP, CBC were essentially unremarkable except for platelets of 90,000. The patient was given vitamin K and Kcentra. The case was discussed with neurosurgery, Dr. Maurice Small, who felt pt could be managed at [facility]  Assessment/Plan: Subarachnoid hemorrhage and Subdural Hematoma -Secondary to syncopal episode resulting in a fall and hitting his head -Patient presently has no focal neurologic deficit -Case was discussed neurosurgery, Dr. Maurice Small, who felt pt could be managed at [facility] -Monitor clinically over the next 24  hours -09/08/18--repeat CT brain--interval small left SDH, 4 mm in thickness -09/08/18--case discussed with neurosurgery--Dr. Cherie Ouch" SDH represents redistribution of SA[facility] blood products -due to risk of new bleed, monitor for another 24 hours  Syncope -Appears to have been vasovagal vs DM dysautonomia -Cardiology evaluation -05/07/2018 echo EF 50-55%, diffuse HK, trivial MR -Monitor on telemetry -May 2019 Holter monitor--sinus rhythm with occasional PACs and PVCs  Orthostatic Hypotension -IVF resuscitation -suspect a degree of DM dysautonomia -recheck orthostatics--remain positive -may need to tolerate higher baseline BP -discontinue anti-HTN meds -due to persistent dizziness, continue IVF, monitor another 24 hours  Essential hypertension -Continue carvedilol and lisinopril -decrease dose carvedilol  Diabetes mellitus type 2 with neuropathy -Continue duloxetine and gabapentin -The patient has lost>105 lbs since gastric bypass -no longer on insulin, now diet controlled -09/07/18 A1C - 6.0  Rheumatoid arthritis -Continue Plaquenil  Hyperlipidemia -Continue statin  Thrombocytopenia -check B12--2235 -check folate--28.9 -02/15/17 CT abd did not suggest cirrhosis or splenomegaly -monitor  PE/DVT -clinical hx suggests pt had a provoked event as it occurred within one week of hernia surgery -pt has taken warfarin 10 year -no prior or subsequent VTE events; no hx of thrombophilia -do not plan to restart warfarin--discussed with wife and patient who agree with plan   Disposition Plan:   Home 08/31/18 if stable  Family Communication:   Spouse updated at bedside--Total time spent 35 minutes.  Greater than 50% spent face to face counseling and coordinating care.   Consultants:  neurosurgery  Code Status:  FULL   DVT Prophylaxis:  SCDs   Procedures: As Listed in Progress Note Above  Antibiotics: None    Subjective: Pt feels  dizzy with  standing.  Denies f/c cp, sob, n/v/d, abd pain.  No headache  Objective: Vitals:   09/08/18 2118 09/08/18 2340 09/09/18 0520 09/09/18 1414  BP: (!) 125/59  (!) 181/62 (!) 136/50  Pulse: (!) 58 64 (!) 57 (!) 56  Resp: 20 16 20 20   Temp: 99.1 F (37.3 C)  97.6 F (36.4 C) 98.4 F (36.9 C)  TempSrc: Oral   Oral  SpO2: 100% 96%  98%  Weight:      Height:        Intake/Output Summary (Last 24 hours) at 09/09/2018 1651 Last data filed at 09/09/2018 1000 Gross per 24 hour  Intake 1853.96 ml  Output 1600 ml  Net 253.96 ml   Weight change:  Exam:   General:  Pt is alert, follows commands appropriately, not in acute distress  HEENT: No icterus, No thrush, No neck mass, /AT  Cardiovascular: RRR, S1/S2, no rubs, no gallops  Respiratory: CTA bilaterally, no wheezing, no crackles, no rhonchi  Abdomen: Soft/+BS, non tender, non distended, no guarding  Extremities: trace LE edema, No lymphangitis, No petechiae, No rashes, no synovitis   Data Reviewed: I have personally reviewed following labs and imaging studies Basic Metabolic Panel: Recent Labs  Lab 09/07/18 0859 09/08/18 0652 09/09/18 0658  NA 142 142 141  K 3.6 3.9 3.6  CL 102 105 105  CO2 33* 33* 29  GLUCOSE 118* 95 113*  BUN 26* 21 18  CREATININE 1.13 1.06 0.99  CALCIUM 8.8* 8.2* 8.1*   Liver Function Tests: No results for input(s): AST, ALT, ALKPHOS, BILITOT, PROT, ALBUMIN in the last 168 hours. No results for input(s): LIPASE, AMYLASE in the last 168 hours. No results for input(s): AMMONIA in the last 168 hours. Coagulation Profile: Recent Labs  Lab 09/07/18 1822 09/08/18 0056 09/08/18 0652 09/08/18 1316 09/09/18 0658  INR 1.23 1.17 1.13 1.07 1.12   CBC: Recent Labs  Lab 09/07/18 0859 09/08/18 0652 09/09/18 0658  WBC 4.6 4.7 4.3  NEUTROABS 2.8  --   --   HGB 12.7* 12.7* 12.1*  HCT 37.4* 38.1* 36.2*  MCV 93.0 93.8 94.8  PLT 90* 85* 91*   Cardiac Enzymes: Recent Labs  Lab 09/07/18 0859    TROPONINI <0.03   BNP: Invalid input(s): POCBNP CBG: Recent Labs  Lab 09/08/18 1607 09/08/18 2118 09/09/18 0739 09/09/18 1111 09/09/18 1623  GLUCAP 145* 119* 100* 116* 122*   HbA1C: Recent Labs    09/07/18 1407  HGBA1C 6.0*   Urine analysis:    Component Value Date/Time   COLORURINE YELLOW 10/08/2012 1053   APPEARANCEUR CLEAR 10/08/2012 1053   LABSPEC 1.014 10/08/2012 1053   PHURINE 8.0 10/08/2012 1053   GLUCOSEU 100 (A) 10/08/2012 1053   HGBUR SMALL (A) 10/08/2012 1053   BILIRUBINUR NEGATIVE 10/08/2012 1053   KETONESUR NEGATIVE 10/08/2012 1053   PROTEINUR 30 (A) 10/08/2012 1053   UROBILINOGEN 0.2 10/08/2012 1053   NITRITE NEGATIVE 10/08/2012 1053   LEUKOCYTESUR NEGATIVE 10/08/2012 1053   Sepsis Labs: @LABRCNTIP (procalcitonin:4,lacticidven:4) )No results found for this or any previous visit (from the past 240 hour(s)).   Scheduled Meds: . allopurinol  300 mg Oral q morning - 10a  . atorvastatin  80 mg Oral q1800  . calcitRIOL  0.25 mcg Oral Q breakfast  . calcium carbonate  600 mg of elemental calcium Oral BID WC  . DULoxetine  30 mg Oral Daily  . gabapentin  300 mg Oral BID  . hydroxychloroquine  300 mg Oral BID  .  insulin aspart  0-9 Units Subcutaneous TID WC  . multivitamin with minerals  1 tablet Oral Daily  . pantoprazole  40 mg Oral Daily  . tamsulosin  0.4 mg Oral QPC breakfast   Continuous Infusions:  Procedures/Studies: Ct Angio Head W Or Wo Contrast  Result Date: 09/07/2018 CLINICAL DATA:  Subarachnoid hemorrhage rim follow-up. Fall. Trauma to back of head. EXAM: CT ANGIOGRAPHY HEAD TECHNIQUE: Multidetector CT imaging of the head was performed using the standard protocol during bolus administration of intravenous contrast. Multiplanar CT image reconstructions and MIPs were obtained to evaluate the vascular anatomy. CONTRAST:  <See Chart> ISOVUE-370 IOPAMIDOL (ISOVUE-370) INJECTION 76% COMPARISON:  CT head without contrast 09/07/2018 FINDINGS: CTA  HEAD Anterior circulation: Internal carotid arteries are within normal limits from the high cervical segments through the ICA termini bilaterally. The A1 and M1 segments normal. Anterior communicating artery is patent. MCA bifurcations are within normal limits. No aneurysm is present. Posterior circulation: The left vertebral artery is the dominant vessel. PICA origins are visualized and normal. The basilar artery is. Both posterior cerebral arteries originate from the basilar tip the PCA branch vessels are within normal limits. Venous sinuses: Dural sinuses are patent. The straight sinus deep cerebral veins are intact. Cortical veins are normal. Anatomic variants: None Delayed phase: Postcontrast images demonstrate no pathologic enhancement. Subarachnoid hemorrhage over the left greater than right frontal convexity is stable. IMPRESSION: 1. Normal variant CTA circle-of-Willis without significant proximal stenosis, aneurysm, or branch vessel occlusion. 2. Subarachnoid hemorrhage is stable.  This is likely posttraumatic. Electronically Signed   By: Marin Roberts M.D.   On: 09/07/2018 13:24   Ct Head Wo Contrast  Result Date: 09/08/2018 CLINICAL DATA:  Follow-up intracranial hemorrhage EXAM: CT HEAD WITHOUT CONTRAST TECHNIQUE: Contiguous axial images were obtained from the base of the skull through the vertex without intravenous contrast. COMPARISON:  Yesterday FINDINGS: Brain: Trace subarachnoid hemorrhage seen along the high right cerebral convexity. Parasagittal right posterior frontal blood was not seen previously, but may have redistributed. Left posterior frontal blood seen on prior is not visualized today. Overall subarachnoid hemorrhage volume is stable. There is a newly apparent 4 mm thick subdural hemorrhage along the left tentorium. No mass effect, infarct, or hydrocephalus. Vascular: No hyperdense vessel or unexpected calcification. Skull: Negative for fracture Sinuses/Orbits: Negative Other:  These results were called by telephone at the time of interpretation on 09/08/2018 at 1:30 pm to Dr. Onalee Hua Noelani Harbach , who verbally acknowledged these results. IMPRESSION: 1. Interval small left tentorial subdural hematoma only measuring 4 mm in thickness. No related mass effect. 2. Trace subarachnoid hemorrhage with presumed redistribution since yesterday. Electronically Signed   By: Marnee Spring M.D.   On: 09/08/2018 13:34   Ct Head Wo Contrast  Result Date: 09/07/2018 CLINICAL DATA:  Fall, hit back of head. EXAM: CT HEAD WITHOUT CONTRAST CT CERVICAL SPINE WITHOUT CONTRAST TECHNIQUE: Multidetector CT imaging of the head and cervical spine was performed following the standard protocol without intravenous contrast. Multiplanar CT image reconstructions of the cervical spine were also generated. COMPARISON:  07/12/2012 FINDINGS: CT HEAD FINDINGS Brain: Small amount of subarachnoid blood noted in the high left frontal sulci and possibly anterior right frontal sulci. No intraparenchymal hemorrhage. No mass effect or midline shift. No hydrocephalus. Vascular: No hyperdense vessel or unexpected calcification. Skull: No acute calvarial abnormality. Sinuses/Orbits: Visualized paranasal sinuses and mastoids clear. Orbital soft tissues unremarkable. Other: None CT CERVICAL SPINE FINDINGS Alignment: Normal Skull base and vertebrae: No acute fracture. No primary bone  lesion or focal pathologic process. Soft tissues and spinal canal: No prevertebral fluid or swelling. No visible canal hematoma. Disc levels:  Maintained Upper chest: Negative Other: No acute findings IMPRESSION: Small amount of subarachnoid hemorrhage in the left frontal and possible anterior right frontal regions. No intraparenchymal hemorrhage, hydrocephalus or midline shift. No acute bony abnormality in the cervical spine. These results were called by telephone at the time of interpretation on 09/07/2018 at 9:38 am to Dr. Juleen China , who verbally acknowledged these  results. Electronically Signed   By: Charlett Nose M.D.   On: 09/07/2018 09:39   Ct Cervical Spine Wo Contrast  Result Date: 09/07/2018 CLINICAL DATA:  Fall, hit back of head. EXAM: CT HEAD WITHOUT CONTRAST CT CERVICAL SPINE WITHOUT CONTRAST TECHNIQUE: Multidetector CT imaging of the head and cervical spine was performed following the standard protocol without intravenous contrast. Multiplanar CT image reconstructions of the cervical spine were also generated. COMPARISON:  07/12/2012 FINDINGS: CT HEAD FINDINGS Brain: Small amount of subarachnoid blood noted in the high left frontal sulci and possibly anterior right frontal sulci. No intraparenchymal hemorrhage. No mass effect or midline shift. No hydrocephalus. Vascular: No hyperdense vessel or unexpected calcification. Skull: No acute calvarial abnormality. Sinuses/Orbits: Visualized paranasal sinuses and mastoids clear. Orbital soft tissues unremarkable. Other: None CT CERVICAL SPINE FINDINGS Alignment: Normal Skull base and vertebrae: No acute fracture. No primary bone lesion or focal pathologic process. Soft tissues and spinal canal: No prevertebral fluid or swelling. No visible canal hematoma. Disc levels:  Maintained Upper chest: Negative Other: No acute findings IMPRESSION: Small amount of subarachnoid hemorrhage in the left frontal and possible anterior right frontal regions. No intraparenchymal hemorrhage, hydrocephalus or midline shift. No acute bony abnormality in the cervical spine. These results were called by telephone at the time of interpretation on 09/07/2018 at 9:38 am to Dr. Juleen China , who verbally acknowledged these results. Electronically Signed   By: Charlett Nose M.D.   On: 09/07/2018 09:39   Dg Hip Unilat W Or Wo Pelvis 2-3 Views Left  Result Date: 09/07/2018 CLINICAL DATA:  Chronic left hip pain. EXAM: DG HIP (WITH OR WITHOUT PELVIS) 2-3V LEFT COMPARISON:  None. FINDINGS: There is no evidence of hip fracture or dislocation. There is no  evidence of arthropathy or other focal bone abnormality. IMPRESSION: Normal left hip. Electronically Signed   By: Lupita Raider, M.D.   On: 09/07/2018 09:24    Catarina Hartshorn, DO  [facility] Pager [phone]  If 7PM-7AM, please contact night-coverage [email or web address] Password [email or web address] 09/09/2018, 4:51 PM   LOS: 0 days

## 2018-09-09 NOTE — Plan of Care (Signed)
  Problem: Acute Rehab PT Goals(only PT should resolve) Goal: Patient Will Transfer Sit To/From Stand Outcome: Progressing Flowsheets (Taken 09/09/2018 0937) Patient will transfer sit to/from stand: Independently Goal: Pt Will Transfer Bed To Chair/Chair To Bed Outcome: Progressing Flowsheets (Taken 09/09/2018 0937) Pt will Transfer Bed to Chair/Chair to Bed: Independently Goal: Pt Will Ambulate Outcome: Progressing Flowsheets (Taken 09/09/2018 0937) Pt will Ambulate: > 125 feet; with cane Goal: Pt Will Go Up/Down Stairs Outcome: Progressing Flowsheets (Taken 09/09/2018 0937) Pt will Go Up / Down Stairs: 3-5 stairs; with supervision   Verne Carrow PT, DPT 9:38 AM, 09/09/18 [phone]

## 2018-09-10 DIAGNOSIS — I951 Orthostatic hypotension: Secondary | ICD-10-CM

## 2018-09-10 LAB — CBC
HCT: 37.6 % — ABNORMAL LOW (ref 39.0–52.0)
Hemoglobin: 12.6 g/dL — ABNORMAL LOW (ref 13.0–17.0)
MCH: 31.6 pg (ref 26.0–34.0)
MCHC: 33.5 g/dL (ref 30.0–36.0)
MCV: 94.2 fL (ref 78.0–100.0)
Platelets: 90 10*3/uL — ABNORMAL LOW (ref 150–400)
RBC: 3.99 MIL/uL — ABNORMAL LOW (ref 4.22–5.81)
RDW: 14.9 % (ref 11.5–15.5)
WBC: 4 10*3/uL (ref 4.0–10.5)

## 2018-09-10 LAB — BASIC METABOLIC PANEL
Anion gap: 6 (ref 5–15)
BUN: 16 mg/dL (ref 8–23)
CO2: 29 mmol/L (ref 22–32)
Calcium: 8.3 mg/dL — ABNORMAL LOW (ref 8.9–10.3)
Chloride: 106 mmol/L (ref 98–111)
Creatinine, Ser: 0.94 mg/dL (ref 0.61–1.24)
GFR calc Af Amer: >60 mL/min (ref >60)
GFR calc non Af Amer: >60 mL/min (ref >60)
Glucose, Bld: 120 mg/dL — ABNORMAL HIGH (ref 70–99)
Potassium: 3.8 mmol/L (ref 3.5–5.1)
Sodium: 141 mmol/L (ref 135–145)

## 2018-09-10 LAB — GLUCOSE, CAPILLARY
Glucose-Capillary: 103 mg/dL — ABNORMAL HIGH (ref 70–99)
Glucose-Capillary: 108 mg/dL — ABNORMAL HIGH (ref 70–99)

## 2018-09-10 LAB — PROTIME-INR
INR: 0.92
Prothrombin Time: 12.3 seconds (ref 11.4–15.2)

## 2018-09-10 LAB — MAGNESIUM: Magnesium: 2.2 mg/dL (ref 1.7–2.4)

## 2018-09-10 MED ORDER — CALCIUM CARBONATE ANTACID 500 MG PO CHEW
3.0000 | CHEWABLE_TABLET | Freq: Two times a day (BID) | ORAL | Status: DC
  Administered 2018-09-10: 600 mg via ORAL
  Filled 2018-09-10: qty 3

## 2018-09-10 NOTE — Care Management Note (Signed)
Case Management Note  Patient Details  Name: Mitchell Parker. MRN: [id number] Date of Birth: 1951/02/04  Subjective/Objective:       Admitted with subarachnoid hemorrhage. Pt fell in shower hitting his head, now orthostatic. PT recommends HH PT. Pt agreeable, has had [facility] in the past and would like them again. PT. Pt aware HH Has 48 hrs to make first visit. Has SPC he uses when needed with ambulation. 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 Also has [employer] and local PCP.             Action/Plan: DC home with HH. Bonita Quin, [facility] [place] rep, given the referral and will be updated with discharge date. [facility], no clinicals requested.    Expected Discharge Date:      09/11/2018            Expected Discharge Plan:  Home w Home Health Services  In-House Referral:  NA  Discharge planning Services  CM Consult  Post Acute Care Choice:  Home Health Choice offered to:  Patient HH Arranged:  PT [facility] Agency:  [facility]  Status of Service:  Completed, signed off  Malcolm Metro, RN 09/10/2018, 1:09 PM

## 2018-09-10 NOTE — Progress Notes (Signed)
Pt discharged home today per Dr. Arbutus Leas. Pt's IV site D/C'd and WDL. Pt's VSS. Pt provided with home medication list, discharge instructions. Verbalized understanding. Pt left floor via WC in stable condition accompanied by NT.

## 2018-09-10 NOTE — Discharge Summary (Signed)
Physician Discharge Summary  Mitchell Parker MKJ:[id number] DOB: 05-14-1951 DOA: 09/07/2018  PCP: Toma Deiters, MD  Admit date: 09/07/2018 Discharge date: 09/10/2018  Admitted From: HOME Disposition:  Home   Recommendations for Outpatient Follow-up:  1. Follow up with PCP in 1-2 weeks 2. Please obtain BMP/CBC in one week   Home Health: YES Equipment/Devices: HHPT  Discharge Condition: Stable CODE STATUS: DNR Diet recommendation: Heart Healthy   Brief/Interim Summary: 67 y.o.malewith medical history ofCOPD, nonischemic cardia myopathy, rheumatoid arthritis, OSA, diet-controlled diabetes mellitus, hypertension, PE/DVT presented after a syncopal episode. The patient was getting up to go to the bathroom in the morning of 09/07/2018. The patient went to blow his nose, and the next thing he remembered was waking up on the floor. The patient's wife heard the patient fall, and came to check on the patient. She stated that it was a matter of 1 minute from the time of the fall until she arrived. The patient was awake and alert and conversant when she saw him. They came to the emergency department by private vehicle. The patient fell and hit the back of his head against the walk-in shower. He denies any other injuries. He had some pain in his back where he hit his head. The patient had denied any prodromal symptoms including chest discomfort, shortness of breath, palpitations, aura. He had denied any recent dyspnea on exertion or decreased exercise tolerance. His carvedilol was decreased to 625 mg twice daily with improvement of his dizziness symptoms. He denied any nausea, vomiting, bowel or bladder incontinence. In the emergency department, the patient was afebrile hemodynamically stable saturating 100% room air. BMP, CBC were essentially unremarkable except for platelets of 90,000. The patient was given vitamin K and Kcentra. The case was discussed with neurosurgery, Dr.  Maurice Small, who felt pt could be managed at [facility].  During the hospitalization, pt continued to have positive orthostatics despite stopping all his anti-HTN.  His BP remained acceptable.   The drops in BP with standing were less but remained signficant. There is likely a component of DM dysautonomia.   In the setting of SAH, a mutual decision was made to hold off on starting midodrine for 2-3 weeks when he follows up with PCP.  Discharge Diagnoses:  Subarachnoid hemorrhageand Subdural Hematoma -Secondary to syncopal episode resulting in a fall and hitting his head -Patient presently has no focal neurologic deficit -Case was discussed neurosurgery, Dr. Maurice Small, who felt pt could be managed at [facility] -Monitor clinically over the next 24 hours -09/08/18--repeat CT brain--interval small left SDH, 4 mm in thickness -09/08/18--case discussed with neurosurgery--Dr. Cherie Ouch" SDH represents redistribution of SAH blood products -neurologically stable and asymptomatic  Syncope -Appears to have been vasovagalvs DM dysautonomia -05/07/2018 echo EF 50-55%, diffuse HK, trivial MR -Monitor on telemetry -May 2019 Holter monitor--sinus rhythm with occasional PACs and PVCs  Orthostatic Hypotension -IVF resuscitation->remained positive orthostatics -suspect a degree of DM dysautonomia -recheck orthostatics--remain positive -will need to tolerate higher baseline BP -discontinue anti-HTN meds -d/c gabapentin -In the setting of SAH, a mutual decision was made to hold off on starting midodrine for 2-3 weeks when he follows up with PCP. -please recheck orthostatics in office  Essential hypertension -Continue carvedilol and lisinopril -decrease dose carvedilol  Diabetes mellitus type 2 with neuropathy -Continue duloxetine and gabapentin -The patient has lost>105 lbs since gastric bypass -no longer on insulin, now diet controlled -09/07/18 A1C - 6.0  Rheumatoid arthritis -Continue  Plaquenil  Hyperlipidemia -Continue statin  Thrombocytopenia -check  B12--2235 -check folate--28.9 -02/15/17 CT abd did not suggest cirrhosis or splenomegaly -monitor  PE/DVT -clinical hx suggests pt had a provoked event as it occurred within one week of hernia surgery -pt has taken warfarin 10 years -no prior or subsequent VTE events; no hx of thrombophilia -do not plan to restart warfarin--discussed with wife and patient who agree with plan   Discharge Instructions   Allergies as of 09/10/2018      Reactions   Ciprofloxacin Hives   Definity [perflutren Lipid Microsphere] Nausea And Vomiting   During ECHO with Definity injection pt became nauseated and vomited    Levofloxacin [levofloxacin] Hives      Medication List    STOP taking these medications   carvedilol 6.25 MG tablet Commonly known as:  COREG   gabapentin 300 MG capsule Commonly known as:  NEURONTIN   insulin aspart 100 UNIT/ML injection Commonly known as:  novoLOG   insulin NPH Human 100 UNIT/ML injection Commonly known as:  HUMULIN N,NOVOLIN N   lisinopril 5 MG tablet Commonly known as:  PRINIVIL,ZESTRIL   metoCLOPramide 5 MG tablet Commonly known as:  REGLAN   tamsulosin 0.4 MG Caps capsule Commonly known as:  FLOMAX   warfarin 2.5 MG tablet Commonly known as:  COUMADIN   warfarin 5 MG tablet Commonly known as:  COUMADIN     TAKE these medications   albuterol-ipratropium 18-103 MCG/ACT inhaler Commonly known as:  COMBIVENT Inhale 2 puffs into the lungs every 6 (six) hours as needed. Wheezing   allopurinol 300 MG tablet Commonly known as:  ZYLOPRIM Take 300 mg by mouth every morning.   aspirin 81 MG tablet Take 1 tablet (81 mg total) by mouth daily.   atorvastatin 80 MG tablet Commonly known as:  LIPITOR Take 80 mg by mouth.   calcitRIOL 0.25 MCG capsule Commonly known as:  ROCALTROL Take 0.25 mcg by mouth daily with breakfast.   calcium carbonate 1500 (600 Ca) MG Tabs  tablet Commonly known as:  OSCAL Take 600 mg of elemental calcium by mouth 2 (two) times daily with a meal.   Colchicine 0.6 MG Caps Take 0.6 mg by mouth daily.   CYMBALTA PO Take 30 mg by mouth.   furosemide 40 MG tablet Commonly known as:  LASIX Take 80 mg by mouth 2 (two) times daily.   HYDROXYCHLOROQUINE SULFATE PO Take 300 mg by mouth 2 (two) times daily.   multivitamin capsule Take 1 capsule by mouth daily.   pantoprazole 40 MG tablet Commonly known as:  PROTONIX Take 40 mg by mouth daily.   VITAMIN B 12 PO Take by mouth.       Allergies  Allergen Reactions  . Ciprofloxacin Hives  . Definity [Perflutren Lipid Microsphere] Nausea And Vomiting    During ECHO with Definity injection pt became nauseated and vomited   . Levofloxacin [Levofloxacin] Hives    Consultations:  Neurosurgery on phone   Procedures/Studies: Ct Angio Head W Or Wo Contrast  Result Date: 09/07/2018 CLINICAL DATA:  Subarachnoid hemorrhage rim follow-up. Fall. Trauma to back of head. EXAM: CT ANGIOGRAPHY HEAD TECHNIQUE: Multidetector CT imaging of the head was performed using the standard protocol during bolus administration of intravenous contrast. Multiplanar CT image reconstructions and MIPs were obtained to evaluate the vascular anatomy. CONTRAST:  <See Chart> ISOVUE-370 IOPAMIDOL (ISOVUE-370) INJECTION 76% COMPARISON:  CT head without contrast 09/07/2018 FINDINGS: CTA HEAD Anterior circulation: Internal carotid arteries are within normal limits from the high cervical segments through the ICA termini bilaterally.  The A1 and M1 segments normal. Anterior communicating artery is patent. MCA bifurcations are within normal limits. No aneurysm is present. Posterior circulation: The left vertebral artery is the dominant vessel. PICA origins are visualized and normal. The basilar artery is. Both posterior cerebral arteries originate from the basilar tip the PCA branch vessels are within normal limits.  Venous sinuses: Dural sinuses are patent. The straight sinus deep cerebral veins are intact. Cortical veins are normal. Anatomic variants: None Delayed phase: Postcontrast images demonstrate no pathologic enhancement. Subarachnoid hemorrhage over the left greater than right frontal convexity is stable. IMPRESSION: 1. Normal variant CTA circle-of-Willis without significant proximal stenosis, aneurysm, or branch vessel occlusion. 2. Subarachnoid hemorrhage is stable.  This is likely posttraumatic. Electronically Signed   By: Marin Roberts M.D.   On: 09/07/2018 13:24   Ct Head Wo Contrast  Result Date: 09/08/2018 CLINICAL DATA:  Follow-up intracranial hemorrhage EXAM: CT HEAD WITHOUT CONTRAST TECHNIQUE: Contiguous axial images were obtained from the base of the skull through the vertex without intravenous contrast. COMPARISON:  Yesterday FINDINGS: Brain: Trace subarachnoid hemorrhage seen along the high right cerebral convexity. Parasagittal right posterior frontal blood was not seen previously, but may have redistributed. Left posterior frontal blood seen on prior is not visualized today. Overall subarachnoid hemorrhage volume is stable. There is a newly apparent 4 mm thick subdural hemorrhage along the left tentorium. No mass effect, infarct, or hydrocephalus. Vascular: No hyperdense vessel or unexpected calcification. Skull: Negative for fracture Sinuses/Orbits: Negative Other: These results were called by telephone at the time of interpretation on 09/08/2018 at 1:30 pm to Dr. Onalee Hua Cicily Bonano , who verbally acknowledged these results. IMPRESSION: 1. Interval small left tentorial subdural hematoma only measuring 4 mm in thickness. No related mass effect. 2. Trace subarachnoid hemorrhage with presumed redistribution since yesterday. Electronically Signed   By: Marnee Spring M.D.   On: 09/08/2018 13:34   Ct Head Wo Contrast  Result Date: 09/07/2018 CLINICAL DATA:  Fall, hit back of head. EXAM: CT HEAD WITHOUT  CONTRAST CT CERVICAL SPINE WITHOUT CONTRAST TECHNIQUE: Multidetector CT imaging of the head and cervical spine was performed following the standard protocol without intravenous contrast. Multiplanar CT image reconstructions of the cervical spine were also generated. COMPARISON:  07/12/2012 FINDINGS: CT HEAD FINDINGS Brain: Small amount of subarachnoid blood noted in the high left frontal sulci and possibly anterior right frontal sulci. No intraparenchymal hemorrhage. No mass effect or midline shift. No hydrocephalus. Vascular: No hyperdense vessel or unexpected calcification. Skull: No acute calvarial abnormality. Sinuses/Orbits: Visualized paranasal sinuses and mastoids clear. Orbital soft tissues unremarkable. Other: None CT CERVICAL SPINE FINDINGS Alignment: Normal Skull base and vertebrae: No acute fracture. No primary bone lesion or focal pathologic process. Soft tissues and spinal canal: No prevertebral fluid or swelling. No visible canal hematoma. Disc levels:  Maintained Upper chest: Negative Other: No acute findings IMPRESSION: Small amount of subarachnoid hemorrhage in the left frontal and possible anterior right frontal regions. No intraparenchymal hemorrhage, hydrocephalus or midline shift. No acute bony abnormality in the cervical spine. These results were called by telephone at the time of interpretation on 09/07/2018 at 9:38 am to Dr. Juleen China , who verbally acknowledged these results. Electronically Signed   By: Charlett Nose M.D.   On: 09/07/2018 09:39   Ct Cervical Spine Wo Contrast  Result Date: 09/07/2018 CLINICAL DATA:  Fall, hit back of head. EXAM: CT HEAD WITHOUT CONTRAST CT CERVICAL SPINE WITHOUT CONTRAST TECHNIQUE: Multidetector CT imaging of the head and cervical spine  was performed following the standard protocol without intravenous contrast. Multiplanar CT image reconstructions of the cervical spine were also generated. COMPARISON:  07/12/2012 FINDINGS: CT HEAD FINDINGS Brain: Small amount  of subarachnoid blood noted in the high left frontal sulci and possibly anterior right frontal sulci. No intraparenchymal hemorrhage. No mass effect or midline shift. No hydrocephalus. Vascular: No hyperdense vessel or unexpected calcification. Skull: No acute calvarial abnormality. Sinuses/Orbits: Visualized paranasal sinuses and mastoids clear. Orbital soft tissues unremarkable. Other: None CT CERVICAL SPINE FINDINGS Alignment: Normal Skull base and vertebrae: No acute fracture. No primary bone lesion or focal pathologic process. Soft tissues and spinal canal: No prevertebral fluid or swelling. No visible canal hematoma. Disc levels:  Maintained Upper chest: Negative Other: No acute findings IMPRESSION: Small amount of subarachnoid hemorrhage in the left frontal and possible anterior right frontal regions. No intraparenchymal hemorrhage, hydrocephalus or midline shift. No acute bony abnormality in the cervical spine. These results were called by telephone at the time of interpretation on 09/07/2018 at 9:38 am to Dr. Juleen China , who verbally acknowledged these results. Electronically Signed   By: Charlett Nose M.D.   On: 09/07/2018 09:39   Dg Hip Unilat W Or Wo Pelvis 2-3 Views Left  Result Date: 09/07/2018 CLINICAL DATA:  Chronic left hip pain. EXAM: DG HIP (WITH OR WITHOUT PELVIS) 2-3V LEFT COMPARISON:  None. FINDINGS: There is no evidence of hip fracture or dislocation. There is no evidence of arthropathy or other focal bone abnormality. IMPRESSION: Normal left hip. Electronically Signed   By: Lupita Raider, M.D.   On: 09/07/2018 09:24        Discharge Exam: Vitals:   09/09/18 2114 09/10/18 0602  BP: (!) 152/50 (!) 178/81  Pulse: (!) 59 (!) 54  Resp: 20   Temp: 99.6 F (37.6 C) (!) 97.5 F (36.4 C)  SpO2: 96% 99%   Vitals:   09/09/18 1414 09/09/18 2004 09/09/18 2114 09/10/18 0602  BP: (!) 136/50  (!) 152/50 (!) 178/81  Pulse: (!) 56 (!) 56 (!) 59 (!) 54  Resp: 20 20 20    Temp: 98.4 F  (36.9 C)  99.6 F (37.6 C) (!) 97.5 F (36.4 C)  TempSrc: Oral  Oral Oral  SpO2: 98% 96% 96% 99%  Weight:      Height:        General: Pt is alert, awake, not in acute distress Cardiovascular: RRR, S1/S2 +, no rubs, no gallops Respiratory: CTA bilaterally, no wheezing, no rhonchi Abdominal: Soft, NT, ND, bowel sounds + Extremities: 1 + LE edema, no cyanosis   The results of significant diagnostics from this hospitalization (including imaging, microbiology, ancillary and laboratory) are listed below for reference.    Significant Diagnostic Studies: Ct Angio Head W Or Wo Contrast  Result Date: 09/07/2018 CLINICAL DATA:  Subarachnoid hemorrhage rim follow-up. Fall. Trauma to back of head. EXAM: CT ANGIOGRAPHY HEAD TECHNIQUE: Multidetector CT imaging of the head was performed using the standard protocol during bolus administration of intravenous contrast. Multiplanar CT image reconstructions and MIPs were obtained to evaluate the vascular anatomy. CONTRAST:  <See Chart> ISOVUE-370 IOPAMIDOL (ISOVUE-370) INJECTION 76% COMPARISON:  CT head without contrast 09/07/2018 FINDINGS: CTA HEAD Anterior circulation: Internal carotid arteries are within normal limits from the high cervical segments through the ICA termini bilaterally. The A1 and M1 segments normal. Anterior communicating artery is patent. MCA bifurcations are within normal limits. No aneurysm is present. Posterior circulation: The left vertebral artery is the dominant vessel. PICA origins are  visualized and normal. The basilar artery is. Both posterior cerebral arteries originate from the basilar tip the PCA branch vessels are within normal limits. Venous sinuses: Dural sinuses are patent. The straight sinus deep cerebral veins are intact. Cortical veins are normal. Anatomic variants: None Delayed phase: Postcontrast images demonstrate no pathologic enhancement. Subarachnoid hemorrhage over the left greater than right frontal convexity is  stable. IMPRESSION: 1. Normal variant CTA circle-of-Willis without significant proximal stenosis, aneurysm, or branch vessel occlusion. 2. Subarachnoid hemorrhage is stable.  This is likely posttraumatic. Electronically Signed   By: Marin Roberts M.D.   On: 09/07/2018 13:24   Ct Head Wo Contrast  Result Date: 09/08/2018 CLINICAL DATA:  Follow-up intracranial hemorrhage EXAM: CT HEAD WITHOUT CONTRAST TECHNIQUE: Contiguous axial images were obtained from the base of the skull through the vertex without intravenous contrast. COMPARISON:  Yesterday FINDINGS: Brain: Trace subarachnoid hemorrhage seen along the high right cerebral convexity. Parasagittal right posterior frontal blood was not seen previously, but may have redistributed. Left posterior frontal blood seen on prior is not visualized today. Overall subarachnoid hemorrhage volume is stable. There is a newly apparent 4 mm thick subdural hemorrhage along the left tentorium. No mass effect, infarct, or hydrocephalus. Vascular: No hyperdense vessel or unexpected calcification. Skull: Negative for fracture Sinuses/Orbits: Negative Other: These results were called by telephone at the time of interpretation on 09/08/2018 at 1:30 pm to Dr. Onalee Hua Valicia Rief , who verbally acknowledged these results. IMPRESSION: 1. Interval small left tentorial subdural hematoma only measuring 4 mm in thickness. No related mass effect. 2. Trace subarachnoid hemorrhage with presumed redistribution since yesterday. Electronically Signed   By: Marnee Spring M.D.   On: 09/08/2018 13:34   Ct Head Wo Contrast  Result Date: 09/07/2018 CLINICAL DATA:  Fall, hit back of head. EXAM: CT HEAD WITHOUT CONTRAST CT CERVICAL SPINE WITHOUT CONTRAST TECHNIQUE: Multidetector CT imaging of the head and cervical spine was performed following the standard protocol without intravenous contrast. Multiplanar CT image reconstructions of the cervical spine were also generated. COMPARISON:  07/12/2012  FINDINGS: CT HEAD FINDINGS Brain: Small amount of subarachnoid blood noted in the high left frontal sulci and possibly anterior right frontal sulci. No intraparenchymal hemorrhage. No mass effect or midline shift. No hydrocephalus. Vascular: No hyperdense vessel or unexpected calcification. Skull: No acute calvarial abnormality. Sinuses/Orbits: Visualized paranasal sinuses and mastoids clear. Orbital soft tissues unremarkable. Other: None CT CERVICAL SPINE FINDINGS Alignment: Normal Skull base and vertebrae: No acute fracture. No primary bone lesion or focal pathologic process. Soft tissues and spinal canal: No prevertebral fluid or swelling. No visible canal hematoma. Disc levels:  Maintained Upper chest: Negative Other: No acute findings IMPRESSION: Small amount of subarachnoid hemorrhage in the left frontal and possible anterior right frontal regions. No intraparenchymal hemorrhage, hydrocephalus or midline shift. No acute bony abnormality in the cervical spine. These results were called by telephone at the time of interpretation on 09/07/2018 at 9:38 am to Dr. Juleen China , who verbally acknowledged these results. Electronically Signed   By: Charlett Nose M.D.   On: 09/07/2018 09:39   Ct Cervical Spine Wo Contrast  Result Date: 09/07/2018 CLINICAL DATA:  Fall, hit back of head. EXAM: CT HEAD WITHOUT CONTRAST CT CERVICAL SPINE WITHOUT CONTRAST TECHNIQUE: Multidetector CT imaging of the head and cervical spine was performed following the standard protocol without intravenous contrast. Multiplanar CT image reconstructions of the cervical spine were also generated. COMPARISON:  07/12/2012 FINDINGS: CT HEAD FINDINGS Brain: Small amount of subarachnoid blood noted  in the high left frontal sulci and possibly anterior right frontal sulci. No intraparenchymal hemorrhage. No mass effect or midline shift. No hydrocephalus. Vascular: No hyperdense vessel or unexpected calcification. Skull: No acute calvarial abnormality.  Sinuses/Orbits: Visualized paranasal sinuses and mastoids clear. Orbital soft tissues unremarkable. Other: None CT CERVICAL SPINE FINDINGS Alignment: Normal Skull base and vertebrae: No acute fracture. No primary bone lesion or focal pathologic process. Soft tissues and spinal canal: No prevertebral fluid or swelling. No visible canal hematoma. Disc levels:  Maintained Upper chest: Negative Other: No acute findings IMPRESSION: Small amount of subarachnoid hemorrhage in the left frontal and possible anterior right frontal regions. No intraparenchymal hemorrhage, hydrocephalus or midline shift. No acute bony abnormality in the cervical spine. These results were called by telephone at the time of interpretation on 09/07/2018 at 9:38 am to Dr. Juleen China , who verbally acknowledged these results. Electronically Signed   By: Charlett Nose M.D.   On: 09/07/2018 09:39   Dg Hip Unilat W Or Wo Pelvis 2-3 Views Left  Result Date: 09/07/2018 CLINICAL DATA:  Chronic left hip pain. EXAM: DG HIP (WITH OR WITHOUT PELVIS) 2-3V LEFT COMPARISON:  None. FINDINGS: There is no evidence of hip fracture or dislocation. There is no evidence of arthropathy or other focal bone abnormality. IMPRESSION: Normal left hip. Electronically Signed   By: Lupita Raider, M.D.   On: 09/07/2018 09:24     Microbiology: No results found for this or any previous visit (from the past 240 hour(s)).   Labs: Basic Metabolic Panel: Recent Labs  Lab 09/07/18 0859 09/08/18 0652 09/09/18 0658 09/10/18 0531  NA 142 142 141 141  K 3.6 3.9 3.6 3.8  CL 102 105 105 106  CO2 33* 33* 29 29  GLUCOSE 118* 95 113* 120*  BUN 26* 21 18 16   CREATININE 1.13 1.06 0.99 0.94  CALCIUM 8.8* 8.2* 8.1* 8.3*  MG  --   --   --  2.2   Liver Function Tests: No results for input(s): AST, ALT, ALKPHOS, BILITOT, PROT, ALBUMIN in the last 168 hours. No results for input(s): LIPASE, AMYLASE in the last 168 hours. No results for input(s): AMMONIA in the last 168  hours. CBC: Recent Labs  Lab 09/07/18 0859 09/08/18 0652 09/09/18 0658 09/10/18 0531  WBC 4.6 4.7 4.3 4.0  NEUTROABS 2.8  --   --   --   HGB 12.7* 12.7* 12.1* 12.6*  HCT 37.4* 38.1* 36.2* 37.6*  MCV 93.0 93.8 94.8 94.2  PLT 90* 85* 91* 90*   Cardiac Enzymes: Recent Labs  Lab 09/07/18 0859  TROPONINI <0.03   BNP: Invalid input(s): POCBNP CBG: Recent Labs  Lab 09/09/18 1111 09/09/18 1623 09/09/18 2142 09/10/18 0759 09/10/18 1135  GLUCAP 116* 122* 157* 103* 108*    Time coordinating discharge:  36 minutes  Signed:  09/12/18, DO [facility] Pager: [phone] 09/10/2018, 3:31 PM

## 2018-09-27 ENCOUNTER — Ambulatory Visit (INDEPENDENT_AMBULATORY_CARE_PROVIDER_SITE_OTHER): Payer: Medicare HMO | Admitting: Family Medicine

## 2018-09-27 ENCOUNTER — Encounter: Payer: Self-pay | Admitting: Family Medicine

## 2018-09-27 ENCOUNTER — Telehealth: Payer: Self-pay | Admitting: Family Medicine

## 2018-09-27 VITALS — BP 114/63 | HR 70 | Temp 99.3°F | Ht 72.0 in | Wt 254.6 lb

## 2018-09-27 DIAGNOSIS — E1122 Type 2 diabetes mellitus with diabetic chronic kidney disease: Secondary | ICD-10-CM | POA: Diagnosis not present

## 2018-09-27 DIAGNOSIS — M109 Gout, unspecified: Secondary | ICD-10-CM

## 2018-09-27 DIAGNOSIS — N183 Chronic kidney disease, stage 3 unspecified: Secondary | ICD-10-CM

## 2018-09-27 DIAGNOSIS — M069 Rheumatoid arthritis, unspecified: Secondary | ICD-10-CM | POA: Diagnosis not present

## 2018-09-27 DIAGNOSIS — S065XAA Traumatic subdural hemorrhage with loss of consciousness status unknown, initial encounter: Secondary | ICD-10-CM

## 2018-09-27 DIAGNOSIS — S065X9A Traumatic subdural hemorrhage with loss of consciousness of unspecified duration, initial encounter: Secondary | ICD-10-CM | POA: Diagnosis not present

## 2018-09-27 DIAGNOSIS — Z23 Encounter for immunization: Secondary | ICD-10-CM | POA: Diagnosis not present

## 2018-09-27 DIAGNOSIS — I10 Essential (primary) hypertension: Secondary | ICD-10-CM | POA: Diagnosis not present

## 2018-09-27 DIAGNOSIS — I609 Nontraumatic subarachnoid hemorrhage, unspecified: Secondary | ICD-10-CM | POA: Diagnosis not present

## 2018-09-27 MED ORDER — TAMSULOSIN HCL 0.4 MG PO CAPS: 0.4000 mg | ORAL_CAPSULE | Freq: Every day | ORAL | 5 refills | Status: DC

## 2018-09-27 MED ORDER — FLUTICASONE PROPIONATE 50 MCG/ACT NA SUSP: 2.0000 | Freq: Every day | NASAL | 5 refills | Status: DC | PRN

## 2018-09-27 MED ORDER — FUROSEMIDE 80 MG PO TABS: 80.0000 mg | ORAL_TABLET | Freq: Every morning | ORAL | 3 refills | Status: DC

## 2018-09-27 NOTE — Progress Notes (Signed)
Subjective:  Patient ID: Mitchell Maya.,  male    DOB: [birth year]  Age: 67 y.o.    CC: Establish Care (New pt, was in hosp [facility], fell 3 weeks ago head injury orthostatic hypotension, brain bleeds)   HPI Mitchell KISSOON Sr. presents for  follow-up of hypertension. Patient has no history of headache chest pain or shortness of breath or recent cough. Patient also denies symptoms of TIA such as numbness weakness lateralizing. Patient denies side effects from medication. States taking it regularly.  Patient also  in for follow-up of elevated cholesterol. Doing well without complaints on current medication. Denies side effects  including myalgia and arthralgia and nausea. Also in today for liver function testing. Currently no chest pain, shortness of breath or other cardiovascular related symptoms noted.  Follow-up of diabetes.  Most recent A1c 2 months ago is 7.0.  This was gleaned from the records sent by his former physician.  3 weeks ago while in any pain hospital, it was 6.0.  Of note is that he has a stage III renal insufficiency attributed to his diabetes.  He is a type II according to records.  Patient denies symptoms such as excessive hunger or urinary frequency, excessive hunger, nausea No significant hypoglycemic spells noted. Medications reviewed. Pt reports taking them regularly. Pt. denies complication/adverse reaction today.   Patient also has history of COPD for which he uses Combivent.  He rarely has any shortness of breath and denies dyspnea on exertion.    Patient was admitted to [facility] due to a fall and subsequent brain bleed.  He has been taking Coumadin.  He no longer takes that medicine.  This is due to history of pulmonary embolism which occurred over a year ago.  He is also had DVT.  Currently he denies shortness of breath chest pain and edema or erythema of the legs. Depression screen PHQ 2/9 09/27/2018  Decreased Interest 3  Down, Depressed, Hopeless 1  PHQ  - 2 Score 4  Altered sleeping 3  Tired, decreased energy 3  Change in appetite 1  Feeling bad or failure about yourself  0  Trouble concentrating 0  Moving slowly or fidgety/restless 0  Suicidal thoughts 0  PHQ-9 Score 11     History Mitchell Parker has a past medical history of CKD (chronic kidney disease) stage 3, GFR 30-59 ml/min (HCC), COPD (chronic obstructive pulmonary disease) ([place]), Essential hypertension, Gallstones, Gout, History of DVT (deep vein thrombosis), Kidney stones, Lumbar disc disease, Neuropathy, Nonischemic cardiomyopathy ([place]), Obesity, RA (rheumatoid arthritis) ([place]), Sleep apnea, and Type 2 diabetes mellitus ([place]).   He has a past surgical history that includes Umbilical hernia repair; Pilonidal cyst excision (1997); Varicose vein surgery; Cholecystectomy (01/18/2012); and Bariatric Surgery (01/11/2018).   His family history includes Diabetes in his brother and father; Heart disease in his father; Hypertension in his father and mother; Stroke in his mother.He reports that he quit smoking about 23 years ago. His smoking use included cigarettes. He has never used smokeless tobacco. He reports that he does not drink alcohol or use drugs.  Current Outpatient Medications on File Prior to Visit  Medication Sig Dispense Refill  . albuterol-ipratropium (COMBIVENT) 18-103 MCG/ACT inhaler Inhale 2 puffs into the lungs every 6 (six) hours as needed. Wheezing    . allopurinol (ZYLOPRIM) 300 MG tablet Take 300 mg by mouth every morning.    [name] aspirin 81 MG tablet Take 1 tablet (81 mg total) by mouth daily. 90 tablet  3  . atorvastatin (LIPITOR) 80 MG tablet Take 80 mg by mouth.    . calcitRIOL (ROCALTROL) 0.25 MCG capsule Take 0.25 mcg by mouth daily with breakfast.     . calcium carbonate (OSCAL) 1500 (600 Ca) MG TABS tablet Take 600 mg of elemental calcium by mouth 2 (two) times daily with a meal.    . Colchicine 0.6 MG CAPS Take 0.6 mg by mouth daily.    . Cyanocobalamin (VITAMIN B 12  PO) Take by mouth.    . DULoxetine HCl (CYMBALTA PO) Take 30 mg by mouth 2 (two) times daily.     [name] HYDROXYCHLOROQUINE SULFATE PO Take 300 mg by mouth 2 (two) times daily.    . Multiple Vitamin (MULTIVITAMIN) capsule Take 1 capsule by mouth daily.    . pantoprazole (PROTONIX) 40 MG tablet Take 40 mg by mouth daily.  0   No current facility-administered medications on file prior to visit.     ROS Review of Systems  Constitutional: Negative.   HENT: Negative.  Negative for congestion, sinus pressure and sinus pain.   Eyes: Negative for visual disturbance.  Respiratory: Negative for cough and shortness of breath.   Cardiovascular: Negative for chest pain and leg swelling.  Gastrointestinal: Negative for abdominal pain, diarrhea, nausea and vomiting.  Genitourinary: Negative for difficulty urinating.  Musculoskeletal: Negative for arthralgias (His hands seem to be bothering him most recently.  He has trouble with his grip.) and myalgias.  Skin: Negative for rash.  Neurological: Negative for headaches.  Psychiatric/Behavioral: Negative for sleep disturbance.    Objective:  BP 114/63   Pulse 70   Temp 99.3 F (37.4 C) (Oral)   Ht 6' (1.829 m)   Wt 254 lb 9.6 oz (115.5 kg)   BMI 34.53 kg/m   BP Readings from Last 3 Encounters:  09/27/18 114/63  09/10/18 (!) 178/81  04/30/18 122/64    Wt Readings from Last 3 Encounters:  09/27/18 254 lb 9.6 oz (115.5 kg)  09/07/18 250 lb (113.4 kg)  04/30/18 297 lb (134.7 kg)     Physical Exam  Constitutional: He is oriented to person, place, and time. He appears well-developed and well-nourished. No distress.  HENT:  Head: Normocephalic and atraumatic.  Right Ear: External ear normal.  Left Ear: External ear normal.  Nose: Nose normal.  Mouth/Throat: Oropharynx is clear and moist.  Eyes: Pupils are equal, round, and reactive to light. Conjunctivae and EOM are normal.  Neck: Normal range of motion. Neck supple.  Cardiovascular: Normal  rate, regular rhythm and normal heart sounds.  No murmur heard. Pulmonary/Chest: Effort normal and breath sounds normal. No respiratory distress. He has no wheezes. He has no rales.  Abdominal: Soft. There is no tenderness.  Musculoskeletal: Normal range of motion.  Neurological: He is alert and oriented to person, place, and time. He has normal reflexes.  Skin: Skin is warm and dry.  Psychiatric: He has a normal mood and affect. His behavior is normal. Judgment and thought content normal.    Diabetic Foot Exam - Simple   No data filed        Assessment & Plan:   Mitchell Parker was seen today for establish care.  Diagnoses and all orders for this visit:  SAH (subarachnoid hemorrhage) ([place])  Subdural hematoma (HCC)  HTN (hypertension), malignant -     CBC -     CMP14+EGFR  Chronic renal impairment, stage 3 (moderate) (HCC) -     CBC -  CMP14+EGFR  Type 2 diabetes mellitus with stage 3 chronic kidney disease, without long-term current use of insulin (HCC) -     CBC -     CMP14+EGFR  Need for vaccination against Streptococcus pneumoniae using pneumococcal conjugate vaccine 13 -     Pneumococcal conjugate vaccine 13-valent  Encounter for immunization -     Flu vaccine HIGH DOSE PF  Rheumatoid arthritis, involving unspecified site, unspecified rheumatoid factor presence ([place]) -     Ambulatory referral to Rheumatology  Gout, unspecified cause, unspecified chronicity, unspecified site -     Ambulatory referral to Rheumatology  Other orders -     furosemide (LASIX) 80 MG tablet; Take 1 tablet (80 mg total) by mouth every morning. -     tamsulosin (FLOMAX) 0.4 MG CAPS capsule; Take 1 capsule (0.4 mg total) by mouth at bedtime. -     fluticasone (FLONASE) 50 MCG/ACT nasal spray; Place 2 sprays into both nostrils daily as needed for allergies or rhinitis.   I have discontinued Mitchell Rua Sr.'s furosemide. I am also having him start on furosemide, tamsulosin, and  fluticasone. Additionally, I am having him maintain his HYDROXYCHLOROQUINE SULFATE PO, calcitRIOL, albuterol-ipratropium, allopurinol, aspirin, atorvastatin, multivitamin, Colchicine, DULoxetine HCl (CYMBALTA PO), pantoprazole, calcium carbonate, and Cyanocobalamin (VITAMIN B 12 PO).  Meds ordered this encounter  Medications  . furosemide (LASIX) 80 MG tablet    Sig: Take 1 tablet (80 mg total) by mouth every morning.    Dispense:  30 tablet    Refill:  3  . tamsulosin (FLOMAX) 0.4 MG CAPS capsule    Sig: Take 1 capsule (0.4 mg total) by mouth at bedtime.    Dispense:  90 capsule    Refill:  5  . fluticasone (FLONASE) 50 MCG/ACT nasal spray    Sig: Place 2 sprays into both nostrils daily as needed for allergies or rhinitis.    Dispense:  16 g    Refill:  5   For now his medications have been reviewed and adjusted appropriately.  I do not think I want to use Coumadin for him right now.  However should he have another episode of thromboembolic disease he would be considered a lifelong candidate.  This would present quite a dilemma secondary to his history of brain bleed.  He is to report immediately any changes in vision headaches numbness tingling and weakness of any extremity.  Avoid high risk activities.  I would like for him to complete a glucose log and bring that back with him to his next check.  Follow-up: Return in about 1 month (around 10/28/2018).  [name], M.D.

## 2018-09-27 NOTE — Telephone Encounter (Signed)
Wife called back and stated she had it handled no longer needs a call back.

## 2018-09-27 NOTE — Patient Instructions (Signed)
Please follow up with Dr. Darlyn Read in 2 weeks.  You can take Citrucel for fiber.

## 2018-09-28 LAB — CMP14+EGFR
ALT: 38 IU/L (ref 0–44)
AST: 34 IU/L (ref 0–40)
Albumin/Globulin Ratio: 1.7 (ref 1.2–2.2)
Albumin: 3.8 g/dL (ref 3.6–4.8)
Alkaline Phosphatase: 63 IU/L (ref 39–117)
BUN/Creatinine Ratio: 19 (ref 10–24)
BUN: 19 mg/dL (ref 8–27)
Bilirubin Total: 0.6 mg/dL (ref 0.0–1.2)
CO2: 27 mmol/L (ref 20–29)
Calcium: 9 mg/dL (ref 8.6–10.2)
Chloride: 98 mmol/L (ref 96–106)
Creatinine, Ser: 0.99 mg/dL (ref 0.76–1.27)
GFR calc Af Amer: 92 mL/min/{1.73_m2} (ref >59)
GFR calc non Af Amer: 80 mL/min/{1.73_m2} (ref >59)
Globulin, Total: 2.2 g/dL (ref 1.5–4.5)
Glucose: 142 mg/dL — ABNORMAL HIGH (ref 65–99)
Potassium: 3.5 mmol/L (ref 3.5–5.2)
Sodium: 141 mmol/L (ref 134–144)
Total Protein: 6 g/dL (ref 6.0–8.5)

## 2018-09-28 LAB — CBC
Hematocrit: 38.4 % (ref 37.5–51.0)
Hemoglobin: 12.7 g/dL — ABNORMAL LOW (ref 13.0–17.7)
MCH: 31.7 pg (ref 26.6–33.0)
MCHC: 33.1 g/dL (ref 31.5–35.7)
MCV: 96 fL (ref 79–97)
Platelets: 147 10*3/uL — ABNORMAL LOW (ref 150–450)
RBC: 4.01 x10E6/uL — ABNORMAL LOW (ref 4.14–5.80)
RDW: 15 % (ref 12.3–15.4)
WBC: 5.6 10*3/uL (ref 3.4–10.8)

## 2018-10-02 ENCOUNTER — Encounter: Payer: Self-pay | Admitting: Family Medicine

## 2018-10-08 DIAGNOSIS — Z961 Presence of intraocular lens: Secondary | ICD-10-CM | POA: Diagnosis not present

## 2018-10-08 DIAGNOSIS — H5212 Myopia, left eye: Secondary | ICD-10-CM | POA: Diagnosis not present

## 2018-10-08 DIAGNOSIS — E119 Type 2 diabetes mellitus without complications: Secondary | ICD-10-CM | POA: Diagnosis not present

## 2018-10-08 DIAGNOSIS — H35371 Puckering of macula, right eye: Secondary | ICD-10-CM | POA: Diagnosis not present

## 2018-10-08 DIAGNOSIS — H52203 Unspecified astigmatism, bilateral: Secondary | ICD-10-CM | POA: Diagnosis not present

## 2018-10-08 DIAGNOSIS — Z79899 Other long term (current) drug therapy: Secondary | ICD-10-CM | POA: Diagnosis not present

## 2018-10-08 DIAGNOSIS — H524 Presbyopia: Secondary | ICD-10-CM | POA: Diagnosis not present

## 2018-10-08 DIAGNOSIS — H5201 Hypermetropia, right eye: Secondary | ICD-10-CM | POA: Diagnosis not present

## 2018-10-08 DIAGNOSIS — M069 Rheumatoid arthritis, unspecified: Secondary | ICD-10-CM | POA: Diagnosis not present

## 2018-10-15 ENCOUNTER — Encounter: Payer: Self-pay | Admitting: Family Medicine

## 2018-10-15 ENCOUNTER — Telehealth: Payer: Self-pay | Admitting: Family Medicine

## 2018-10-15 ENCOUNTER — Ambulatory Visit (INDEPENDENT_AMBULATORY_CARE_PROVIDER_SITE_OTHER): Payer: Medicare HMO | Admitting: Family Medicine

## 2018-10-15 VITALS — BP 133/70 | HR 64 | Temp 97.9°F | Ht 72.0 in | Wt 255.6 lb

## 2018-10-15 DIAGNOSIS — I609 Nontraumatic subarachnoid hemorrhage, unspecified: Secondary | ICD-10-CM

## 2018-10-15 DIAGNOSIS — E1122 Type 2 diabetes mellitus with diabetic chronic kidney disease: Secondary | ICD-10-CM

## 2018-10-15 DIAGNOSIS — S065XAA Traumatic subdural hemorrhage with loss of consciousness status unknown, initial encounter: Secondary | ICD-10-CM

## 2018-10-15 DIAGNOSIS — N183 Chronic kidney disease, stage 3 unspecified: Secondary | ICD-10-CM

## 2018-10-15 DIAGNOSIS — S065X9A Traumatic subdural hemorrhage with loss of consciousness of unspecified duration, initial encounter: Secondary | ICD-10-CM | POA: Diagnosis not present

## 2018-10-15 MED ORDER — DICLOFENAC SODIUM 75 MG PO TBEC: 75.0000 mg | DELAYED_RELEASE_TABLET | Freq: Two times a day (BID) | ORAL | 0 refills | Status: DC

## 2018-10-15 MED ORDER — ATORVASTATIN CALCIUM 80 MG PO TABS: 80.0000 mg | ORAL_TABLET | Freq: Every day | ORAL | 1 refills | Status: DC

## 2018-10-15 MED ORDER — CALCITRIOL 0.25 MCG PO CAPS: 0.2500 ug | ORAL_CAPSULE | Freq: Every day | ORAL | 1 refills | Status: DC

## 2018-10-15 MED ORDER — TRAMADOL HCL 50 MG PO TABS: 50.0000 mg | ORAL_TABLET | Freq: Two times a day (BID) | ORAL | 0 refills | Status: DC | PRN

## 2018-10-15 NOTE — Telephone Encounter (Signed)
Prescribed diclofenac and tramadol today. Both are twice a day as needed. Advised that he should alternate taking them about every 6 hours and to not take them together. Patient stated understanding and will call back if needed.

## 2018-10-15 NOTE — Progress Notes (Signed)
Subjective:  Patient ID: Mitchell Jarred., male    DOB: 01-21-1951  Age: 67 y.o. MRN: [id number]  CC: Medical Management of Chronic Issues (2 week follow up)   HPI Mitchell Rod Sr. presents for recheck of his diabetes blood sugar list brought in showing fasting running between 90 and 100 and postprandials between 101 125.  He continues to have daily headaches at the right temple.  These are moderate severity lasting several hours poorly relieved by Tylenol.  Some relief with ibuprofen.  He would like to have something stronger for pain.  He would also like to know what can be done to stop the headaches.  He has been off of all blood thinners since his fall.  He denies any further orthostasis and he is drinking a lot of water.  He denies any swelling recently.  He does continue to take the Lasix 80 mg daily.  Depression screen [facility] 2/9 10/15/2018 09/27/2018  Decreased Interest 2 3  Down, Depressed, Hopeless 0 1  PHQ - 2 Score 2 4  Altered sleeping 2 3  Tired, decreased energy 2 3  Change in appetite 1 1  Feeling bad or failure about yourself  0 0  Trouble concentrating 0 0  Moving slowly or fidgety/restless 0 0  Suicidal thoughts 0 0  PHQ-9 Score 7 11  Difficult doing work/chores Somewhat difficult -    History Mitchell Parker has a past medical history of CKD (chronic kidney disease) stage 3, GFR 30-59 ml/min (HCC), COPD (chronic obstructive pulmonary disease) (HCC), Essential hypertension, Gallstones, Gout, History of DVT (deep vein thrombosis), Kidney stones, Lumbar disc disease, Neuropathy, Nonischemic cardiomyopathy (HCC), Obesity, RA (rheumatoid arthritis) (HCC), Sleep apnea, and Type 2 diabetes mellitus (HCC).   He has a past surgical history that includes Umbilical hernia repair; Pilonidal cyst excision (1997); Varicose vein surgery; Cholecystectomy (01/18/2012); and Bariatric Surgery (01/11/2018).   His family history includes Diabetes in his brother and father; Heart disease in his  father; Hypertension in his father and mother; Stroke in his mother.He reports that he quit smoking about 23 years ago. His smoking use included cigarettes. He has never used smokeless tobacco. He reports that he does not drink alcohol or use drugs.    ROS Review of Systems  Constitutional: Negative for fever.  Respiratory: Negative for shortness of breath.   Cardiovascular: Negative for chest pain.  Musculoskeletal: Negative for arthralgias.  Skin: Negative for rash.  Neurological: Positive for headaches. Negative for dizziness, tremors, seizures, syncope, facial asymmetry, speech difficulty, weakness, light-headedness and numbness.    Objective:  BP 133/70   Pulse 64   Temp 97.9 F (36.6 C) (Oral)   Ht 6' (1.829 m)   Wt 255 lb 9.6 oz (115.9 kg)   BMI 34.67 kg/m   BP Readings from Last 3 Encounters:  10/15/18 133/70  09/27/18 114/63  09/10/18 (!) 178/81    Wt Readings from Last 3 Encounters:  10/15/18 255 lb 9.6 oz (115.9 kg)  09/27/18 254 lb 9.6 oz (115.5 kg)  09/07/18 250 lb (113.4 kg)     Physical Exam  Constitutional: He is oriented to person, place, and time. He appears well-developed and well-nourished.  HENT:  Head: Normocephalic and atraumatic.  Right Ear: Tympanic membrane and external ear normal. No decreased hearing is noted.  Left Ear: Tympanic membrane and external ear normal. No decreased hearing is noted.  Mouth/Throat: No oropharyngeal exudate or posterior oropharyngeal erythema.  Eyes: Pupils are equal, round, and reactive to  light.  Neck: Normal range of motion. Neck supple.  Cardiovascular: Normal rate and regular rhythm.  No murmur heard. Pulmonary/Chest: Breath sounds normal. No respiratory distress.  Abdominal: Soft. There is no tenderness.  Neurological: He is alert and oriented to person, place, and time. He displays normal reflexes. No cranial nerve deficit. He exhibits normal muscle tone. Coordination normal.  Vitals  reviewed.     Assessment & Plan:   Mitchell Parker was seen today for medical management of chronic issues.  Diagnoses and all orders for this visit:  Type 2 diabetes mellitus with stage 3 chronic kidney disease, without long-term current use of insulin (HCC)  Subdural hematoma (HCC) -     CT HEAD W & WO CONTRAST; Future  SAH (subarachnoid hemorrhage) (HCC)  Other orders -     calcitRIOL (ROCALTROL) 0.25 MCG capsule; Take 1 capsule (0.25 mcg total) by mouth daily with breakfast. -     atorvastatin (LIPITOR) 80 MG tablet; Take 1 tablet (80 mg total) by mouth daily at 6 PM. -     traMADol (ULTRAM) 50 MG tablet; Take 1 tablet (50 mg total) by mouth 2 (two) times daily as needed. -     diclofenac (VOLTAREN) 75 MG EC tablet; Take 1 tablet (75 mg total) by mouth 2 (two) times daily.       I have changed Mitchell Rod Sr.'s calcitRIOL and atorvastatin. I am also having him start on traMADol and diclofenac. Additionally, I am having him maintain his HYDROXYCHLOROQUINE SULFATE PO, albuterol-ipratropium, allopurinol, aspirin, multivitamin, Colchicine, DULoxetine HCl (CYMBALTA PO), pantoprazole, calcium carbonate, Cyanocobalamin (VITAMIN B 12 PO), furosemide, tamsulosin, and fluticasone.  Allergies as of 10/15/2018      Reactions   Ciprofloxacin Hives   Definity [perflutren Lipid Microsphere] Nausea And Vomiting   During ECHO with Definity injection pt became nauseated and vomited    Levofloxacin [levofloxacin] Hives      Medication List        Accurate as of 10/15/18  5:18 PM. Always use your most recent med list.          albuterol-ipratropium 18-103 MCG/ACT inhaler Commonly known as:  COMBIVENT Inhale 2 puffs into the lungs every 6 (six) hours as needed. Wheezing   allopurinol 300 MG tablet Commonly known as:  ZYLOPRIM Take 300 mg by mouth every morning.   aspirin 81 MG tablet Take 1 tablet (81 mg total) by mouth daily.   atorvastatin 80 MG tablet Commonly known as:   LIPITOR Take 1 tablet (80 mg total) by mouth daily at 6 PM.   calcitRIOL 0.25 MCG capsule Commonly known as:  ROCALTROL Take 1 capsule (0.25 mcg total) by mouth daily with breakfast.   calcium carbonate 1500 (600 Ca) MG Tabs tablet Commonly known as:  OSCAL Take 600 mg of elemental calcium by mouth 2 (two) times daily with a meal.   Colchicine 0.6 MG Caps Take 0.6 mg by mouth daily.   CYMBALTA PO Take 30 mg by mouth 2 (two) times daily.   diclofenac 75 MG EC tablet Commonly known as:  VOLTAREN Take 1 tablet (75 mg total) by mouth 2 (two) times daily.   fluticasone 50 MCG/ACT nasal spray Commonly known as:  FLONASE Place 2 sprays into both nostrils daily as needed for allergies or rhinitis.   furosemide 80 MG tablet Commonly known as:  LASIX Take 1 tablet (80 mg total) by mouth every morning.   HYDROXYCHLOROQUINE SULFATE PO Take 300 mg by mouth 2 (two) times daily.  multivitamin capsule Take 1 capsule by mouth daily.   pantoprazole 40 MG tablet Commonly known as:  PROTONIX Take 40 mg by mouth daily.   tamsulosin 0.4 MG Caps capsule Commonly known as:  FLOMAX Take 1 capsule (0.4 mg total) by mouth at bedtime.   traMADol 50 MG tablet Commonly known as:  ULTRAM Take 1 tablet (50 mg total) by mouth 2 (two) times daily as needed.   VITAMIN B 12 PO Take by mouth.      If the bleed has cleared completely then I would like for him to see neurology to determine source of headache.  If it has not completely resolved then watchful waiting and continued pain relief using tramadol seems appropriate.  If it is actually extended then he definitely needs to see neurosurgery.  Blood sugar is stable he is going to continue his treatments as is for now.  Follow-up: Return in about 2 weeks (around 10/29/2018).  [name], M.D.

## 2018-10-16 ENCOUNTER — Other Ambulatory Visit: Payer: Self-pay | Admitting: Family Medicine

## 2018-10-16 ENCOUNTER — Telehealth: Payer: Self-pay | Admitting: Family Medicine

## 2018-10-16 MED ORDER — FUROSEMIDE 40 MG PO TABS: 40.0000 mg | ORAL_TABLET | Freq: Every morning | ORAL | 1 refills | Status: DC

## 2018-10-16 NOTE — Telephone Encounter (Signed)
I sent in the requested prescription 

## 2018-10-24 ENCOUNTER — Ambulatory Visit (HOSPITAL_COMMUNITY): Payer: Medicare HMO

## 2018-11-02 ENCOUNTER — Other Ambulatory Visit: Payer: Self-pay | Admitting: Family Medicine

## 2018-11-02 MED ORDER — ALLOPURINOL 300 MG PO TABS: 300.0000 mg | ORAL_TABLET | Freq: Every morning | ORAL | 3 refills | Status: DC

## 2018-11-05 ENCOUNTER — Ambulatory Visit (HOSPITAL_COMMUNITY)
Admission: RE | Admit: 2018-11-05 | Discharge: 2018-11-05 | Disposition: A | Discharge status: Home / Self Care | Payer: Medicare HMO | Source: Ambulatory Visit | Attending: Family Medicine | Admitting: Family Medicine

## 2018-11-05 DIAGNOSIS — X58XXXA Exposure to other specified factors, initial encounter: Secondary | ICD-10-CM | POA: Diagnosis not present

## 2018-11-05 DIAGNOSIS — S065X9A Traumatic subdural hemorrhage with loss of consciousness of unspecified duration, initial encounter: Secondary | ICD-10-CM | POA: Insufficient documentation

## 2018-11-05 DIAGNOSIS — I6200 Nontraumatic subdural hemorrhage, unspecified: Secondary | ICD-10-CM | POA: Diagnosis not present

## 2018-11-05 DIAGNOSIS — S065XAA Traumatic subdural hemorrhage with loss of consciousness status unknown, initial encounter: Secondary | ICD-10-CM

## 2018-11-05 NOTE — Progress Notes (Signed)
Cardiology Office Note  Date: 11/07/2018   ID: Mitchell Parker., DOB 05-20-1951, MRN [id number]  PCP: Mitchell Fraise, MD  Primary Cardiologist: Mitchell Lesches, MD   Chief Complaint  Patient presents with  . Cardiac follow-up    History of Present Illness: Mitchell Parker. is a 67 y.o. male that I met in May.  He is here today with his wife for a follow-up visit. Interval records reviewed, he was hospitalized in September after a syncopal episode.  He fell and hit his head while in the bathroom and sustained a small subdural hematoma by CT imaging that was managed conservatively per discussion with neurosurgery.  Etiology of syncope was felt to be vasovagal versus contribution from dysautonomia in the setting of diabetes mellitus.  He was found to be orthostatic and received IV fluids.  He was not started on Midrin or volume expanders.  He was taken off lisinopril and Coreg.  Follow-up echocardiogram in May revealed low normal LVEF in the range of 50 to 55%.  Holter monitor showed sinus rhythm with rare to occasional PACs and PVCs as well as brief runs of atrial tachycardia.  He presents today stating that he has had no episodes of dizziness, no progressive sense of palpitations or chest pain.  Blood pressure is mildly elevated today.  We discussed not planning to be aggressive in control of blood pressure.  If he has further episodes of orthostasis we could consider medical therapy. I did talk to him about compression stockings, but he states that he has not been able to tolerate these in the past.  Past Medical History:  Diagnosis Date  . CKD (chronic kidney disease) stage 3, GFR 30-59 ml/min (HCC)   . COPD (chronic obstructive pulmonary disease) ([place])   . Essential hypertension   . Gallstones   . Gout   . History of DVT (deep vein thrombosis)   . Kidney stones   . Lumbar disc disease   . Neuropathy   . Nonischemic cardiomyopathy ([place])    Minor coronary atherosclerosis at  cardiac catheterization 2003, LVEF initially 20% with normalization  . Obesity   . RA (rheumatoid arthritis) ([place])   . Sleep apnea   . Type 2 diabetes mellitus ([place])     Past Surgical History:  Procedure Laterality Date  . BARIATRIC SURGERY  01/11/2018  . CHOLECYSTECTOMY  01/18/2012   Procedure: LAPAROSCOPIC CHOLECYSTECTOMY WITH INTRAOPERATIVE CHOLANGIOGRAM;  Surgeon: Mitchell Moores A. Cornett, MD;  Location: [facility];  Service: General;  Laterality: N/A;  . PILONIDAL CYST EXCISION  1997  . UMBILICAL HERNIA REPAIR    . VARICOSE VEIN SURGERY     left leg    Current Outpatient Medications  Medication Sig Dispense Refill  . albuterol-ipratropium (COMBIVENT) 18-103 MCG/ACT inhaler Inhale 2 puffs into the lungs every 6 (six) hours as needed. Wheezing    . allopurinol (ZYLOPRIM) 300 MG tablet Take 1 tablet (300 mg total) by mouth every morning. 90 tablet 3  . atorvastatin (LIPITOR) 80 MG tablet Take 1 tablet (80 mg total) by mouth daily at 6 PM. 90 tablet 1  . calcitRIOL (ROCALTROL) 0.25 MCG capsule Take 1 capsule (0.25 mcg total) by mouth daily with breakfast. 90 capsule 1  . calcium carbonate (OSCAL) 1500 (600 Ca) MG TABS tablet Take 600 mg of elemental calcium by mouth 2 (two) times daily with a meal.    . Colchicine 0.6 MG CAPS Take 0.6 mg by mouth daily.    . diclofenac (VOLTAREN)  75 MG EC tablet TAKE 1 TABLET BY MOUTH TWICE A DAY 60 tablet 0  . DULoxetine HCl (CYMBALTA PO) Take 30 mg by mouth 2 (two) times daily.     . fluticasone (FLONASE) 50 MCG/ACT nasal spray Place 2 sprays into both nostrils daily as needed for allergies or rhinitis. 16 g 5  . furosemide (LASIX) 80 MG tablet Take 80 mg by mouth daily.    [name] HYDROXYCHLOROQUINE SULFATE PO Take 300 mg by mouth 2 (two) times daily.    . Multiple Vitamin (MULTIVITAMIN) capsule Take 1 capsule by mouth daily.    . pantoprazole (PROTONIX) 40 MG tablet Take 40 mg by mouth daily.  0  . tamsulosin (FLOMAX) 0.4 MG CAPS capsule Take 1 capsule (0.4 mg  total) by mouth at bedtime. 90 capsule 5  . vitamin B-12 (CYANOCOBALAMIN) 500 MCG tablet Take 500 mcg by mouth daily.     No current facility-administered medications for this visit.    Allergies:  Ciprofloxacin; Definity [perflutren lipid microsphere]; and Levofloxacin [levofloxacin]   Social History: The patient  reports that he quit smoking about 23 years ago. His smoking use included cigarettes. He has never used smokeless tobacco. He reports that he does not drink alcohol or use drugs.   ROS:  Please see the history of present illness. Otherwise, complete review of systems is positive for intermittent headaches.  All other systems are reviewed and negative.   Physical Exam: VS:  BP (!) 142/80   Pulse 68   Ht 6' (1.829 m)   Wt 253 lb 12.8 oz (115.1 kg)   SpO2 99%   BMI 34.42 kg/m , BMI Body mass index is 34.42 kg/m.  Wt Readings from Last 3 Encounters:  11/07/18 253 lb 12.8 oz (115.1 kg)  10/15/18 255 lb 9.6 oz (115.9 kg)  09/27/18 254 lb 9.6 oz (115.5 kg)    General: Obese male, appears comfortable at rest. HEENT: Conjunctiva and lids normal, oropharynx clear. Neck: Supple, no elevated JVP or carotid bruits, no thyromegaly. Lungs: Clear to auscultation, nonlabored breathing at rest. Cardiac: Regular rate and rhythm, no S3 or significant systolic murmur. Abdomen: Soft, nontender, bowel sounds present. Extremities: Chronic appearing venous stasis with mild edema, distal pulses 1-2+. Skin: Warm and dry. Musculoskeletal: No kyphosis. Neuropsychiatric: Alert and oriented x3, affect grossly appropriate.  ECG: I personally reviewed the tracing from 09/07/2018 which showed sinus bradycardia with IVCD.  Recent Labwork: 09/10/2018: Magnesium 2.2 09/27/2018: ALT 38; AST 34; BUN 19; Creatinine, Ser 0.99; Hemoglobin 12.7; Platelets 147; Potassium 3.5; Sodium 141   Other Studies Reviewed Today:  Echocardiogram 05/07/2018: Study Conclusions  - Left ventricle: The cavity size was at  the upper limits of   normal. Wall thickness was increased in a pattern of mild LVH.   Systolic function was normal. The estimated ejection fraction was   in the range of 50% to 55%. Diffuse hypokinesis. Left ventricular   diastolic function parameters were normal for the patient&'s age. - Aortic valve: Trileaflet; mildly calcified leaflets. - Mitral valve: Mildly calcified annulus. There was trivial   regurgitation. - Right ventricle: The cavity size was mildly dilated. - Right atrium: Central venous pressure (est): 3 mm Hg. - Atrial septum: No defect or patent foramen ovale was identified. - Tricuspid valve: There was trivial regurgitation. - Pulmonary arteries: PA peak pressure: 20 mm Hg (S). - Pericardium, extracardiac: There was no pericardial effusion.  Holter monitor 05/03/2018: Available strips from 24-hour Holter monitor reviewed.  Sinus rhythm is documented  throughout. There are rare to occasional PACs and PVCs with some ventricular bigeminy.  Brief bursts of atrial tachycardia noted, longest run 10 beats.  Heart rate ranged from 41 bpm up to 94 bpm with average heart rate 52 bpm. No significant pauses were noted.   Assessment and Plan:  1.  History of palpitations, PACs and PVCs documented by cardiac monitoring.  No obvious arrhythmias.  Plan is to continue with observation for now.  LVEF 50 to 55% by echocardiogram.  2.  Orthostasis during hospitalization in September after fall.  He has been taken off lisinopril and Coreg.  For now would observe, could always consider volume expander or midodrine if needed.  3.  Essential hypertension, now off lisinopril and Coreg.  Keep follow-up with PCP.  Current medicines were reviewed with the patient today.  Disposition: Follow-up in 6 months.  Signed, Satira Sark, MD, [facility] 11/07/2018 3:55 PM    [facility] [place], [place], [place] [address] Phone: [phone]; Fax: [phone]

## 2018-11-06 ENCOUNTER — Other Ambulatory Visit: Payer: Self-pay | Admitting: Family Medicine

## 2018-11-07 ENCOUNTER — Encounter: Payer: Self-pay | Admitting: Cardiology

## 2018-11-07 ENCOUNTER — Ambulatory Visit: Payer: Medicare HMO | Admitting: Cardiology

## 2018-11-07 VITALS — BP 142/80 | HR 68 | Ht 72.0 in | Wt 253.8 lb

## 2018-11-07 DIAGNOSIS — I10 Essential (primary) hypertension: Secondary | ICD-10-CM | POA: Diagnosis not present

## 2018-11-07 DIAGNOSIS — R002 Palpitations: Secondary | ICD-10-CM | POA: Diagnosis not present

## 2018-11-07 NOTE — Patient Instructions (Signed)

## 2018-11-16 ENCOUNTER — Ambulatory Visit: Payer: Medicare HMO | Admitting: Family Medicine

## 2018-11-19 ENCOUNTER — Encounter: Payer: Self-pay | Admitting: Family Medicine

## 2018-11-19 ENCOUNTER — Ambulatory Visit (INDEPENDENT_AMBULATORY_CARE_PROVIDER_SITE_OTHER): Payer: Medicare HMO | Admitting: Family Medicine

## 2018-11-19 VITALS — BP 114/40 | HR 81 | Temp 97.5°F | Ht 72.0 in | Wt 258.2 lb

## 2018-11-19 DIAGNOSIS — I10 Essential (primary) hypertension: Secondary | ICD-10-CM

## 2018-11-19 DIAGNOSIS — N2889 Other specified disorders of kidney and ureter: Secondary | ICD-10-CM

## 2018-11-19 DIAGNOSIS — I951 Orthostatic hypotension: Secondary | ICD-10-CM | POA: Diagnosis not present

## 2018-11-19 DIAGNOSIS — I872 Venous insufficiency (chronic) (peripheral): Secondary | ICD-10-CM

## 2018-11-19 DIAGNOSIS — G44319 Acute post-traumatic headache, not intractable: Secondary | ICD-10-CM

## 2018-11-19 DIAGNOSIS — N183 Chronic kidney disease, stage 3 unspecified: Secondary | ICD-10-CM

## 2018-11-19 MED ORDER — COLCHICINE 0.6 MG PO CAPS: 0.6000 mg | ORAL_CAPSULE | Freq: Every day | ORAL | 6 refills | Status: DC

## 2018-11-19 MED ORDER — PANTOPRAZOLE SODIUM 40 MG PO TBEC: 40.0000 mg | DELAYED_RELEASE_TABLET | Freq: Every day | ORAL | 1 refills | Status: DC

## 2018-11-19 MED ORDER — ATORVASTATIN CALCIUM 80 MG PO TABS: 80.0000 mg | ORAL_TABLET | Freq: Every day | ORAL | 1 refills | Status: DC

## 2018-11-19 MED ORDER — CELECOXIB 200 MG PO CAPS: 200.0000 mg | ORAL_CAPSULE | Freq: Every day | ORAL | 5 refills | Status: DC

## 2018-11-19 MED ORDER — ALLOPURINOL 300 MG PO TABS: 300.0000 mg | ORAL_TABLET | Freq: Every morning | ORAL | 3 refills | Status: DC

## 2018-11-19 MED ORDER — FUROSEMIDE 80 MG PO TABS: 40.0000 mg | ORAL_TABLET | Freq: Every day | ORAL | 5 refills | Status: DC

## 2018-11-19 MED ORDER — TAMSULOSIN HCL 0.4 MG PO CAPS: 0.4000 mg | ORAL_CAPSULE | Freq: Every day | ORAL | 1 refills | Status: DC

## 2018-11-19 NOTE — Progress Notes (Addendum)
Subjective:  Patient ID: Mitchell Parker., male    DOB: 30-Jan-1951  Age: 67 y.o. MRN: [id number]  CC: Follow-up   HPI Mitchell COSTABILE Sr. presents for follow-up of his subarachnoid hemorrhage and subsequent headaches.  He is still getting the headaches 2-3 times a week lasting all day.  The intensity has diminished.  However, they are still 5-6/10 at times.  They do not affect vision or cause focal neurologic defects such as numbness weakness or tingling.  He has problems with chronic edema in the legs and does still take the Lasix.  His subarachnoid hemorrhage was caused by a fall.  This was brought on by orthostasis.  He is still having some orthostasis episodes although they are mild.  Also they are infrequent.  CT scan reviewed with patient shows resolution of the intracranial bleeding. Depression screen [facility] 2/9 11/19/2018 10/15/2018 09/27/2018  Decreased Interest 0 2 3  Down, Depressed, Hopeless 0 0 1  PHQ - 2 Score 0 2 4  Altered sleeping - 2 3  Tired, decreased energy - 2 3  Change in appetite - 1 1  Feeling bad or failure about yourself  - 0 0  Trouble concentrating - 0 0  Moving slowly or fidgety/restless - 0 0  Suicidal thoughts - 0 0  PHQ-9 Score - 7 11  Difficult doing work/chores - Somewhat difficult -    History William has a past medical history of CKD (chronic kidney disease) stage 3, GFR 30-59 ml/min (HCC), COPD (chronic obstructive pulmonary disease) (HCC), Essential hypertension, Gallstones, Gout, History of DVT (deep vein thrombosis), Kidney stones, Lumbar disc disease, Neuropathy, Nonischemic cardiomyopathy (HCC), Obesity, RA (rheumatoid arthritis) (HCC), Sleep apnea, and Type 2 diabetes mellitus (HCC).   He has a past surgical history that includes Umbilical hernia repair; Pilonidal cyst excision (1997); Varicose vein surgery; Cholecystectomy (01/18/2012); and Bariatric Surgery (01/11/2018).   His family history includes Diabetes in his brother and father; Heart disease  in his father; Hypertension in his father and mother; Stroke in his mother.He reports that he quit smoking about 23 years ago. His smoking use included cigarettes. He has never used smokeless tobacco. He reports that he does not drink alcohol or use drugs.    ROS Review of Systems  Constitutional: Negative for fever.  Respiratory: Negative for shortness of breath.   Cardiovascular: Positive for leg swelling. Negative for chest pain.  Musculoskeletal: Negative for arthralgias.  Skin: Negative for rash.    Objective:  BP (!) 114/40 (Patient Position: Standing)   Pulse 81   Temp (!) 97.5 F (36.4 C)   Ht 6' (1.829 m)   Wt 258 lb 3.2 oz (117.1 kg)   BMI 35.02 kg/m   BP Readings from Last 3 Encounters:  11/19/18 (!) 114/40  11/07/18 (!) 142/80  10/15/18 133/70    Wt Readings from Last 3 Encounters:  11/19/18 258 lb 3.2 oz (117.1 kg)  11/07/18 253 lb 12.8 oz (115.1 kg)  10/15/18 255 lb 9.6 oz (115.9 kg)     Physical Exam  Constitutional: He is oriented to person, place, and time. He appears well-developed and well-nourished. No distress.  HENT:  Head: Normocephalic.  Eyes: Pupils are equal, round, and reactive to light. Conjunctivae and EOM are normal.  Neck: Normal range of motion. Neck supple.  Cardiovascular: Normal rate, regular rhythm and normal heart sounds.  No murmur heard.   Patient has significant varicosities in both lower extremities with dark skin discoloration from chronic use  of silver-containing topical creams  Pulmonary/Chest: Effort normal and breath sounds normal. No respiratory distress. He has no wheezes. He has no rales.  Abdominal: Soft. There is no tenderness.  Musculoskeletal: Normal range of motion.  Neurological: He is alert and oriented to person, place, and time. He has normal reflexes.  Skin: Skin is warm and dry. No erythema.  Psychiatric: He has a normal mood and affect. His behavior is normal. Judgment and thought content normal.       Assessment & Plan:   Jemir was seen today for follow-up.  Diagnoses and all orders for this visit:  Orthostatic hypotension  Chronic renal impairment, stage 3 (moderate) (HCC)  Essential hypertension  Venous insufficiency of both lower extremities  Acute post-traumatic headache, not intractable  Other orders -     furosemide (LASIX) 80 MG tablet; Take 0.5 tablets (40 mg total) by mouth daily. May take the extra half for increased swelling -     tamsulosin (FLOMAX) 0.4 MG CAPS capsule; Take 1 capsule (0.4 mg total) by mouth at bedtime. -     pantoprazole (PROTONIX) 40 MG tablet; Take 1 tablet (40 mg total) by mouth daily. -     Colchicine 0.6 MG CAPS; Take 0.6 mg by mouth daily. -     atorvastatin (LIPITOR) 80 MG tablet; Take 1 tablet (80 mg total) by mouth daily at 6 PM. -     allopurinol (ZYLOPRIM) 300 MG tablet; Take 1 tablet (300 mg total) by mouth every morning. -     celecoxib (CELEBREX) 200 MG capsule; Take 1 capsule (200 mg total) by mouth daily. With food       I have discontinued [name]'s diclofenac. I have also changed his furosemide and pantoprazole. Additionally, I am having him start on celecoxib. Lastly, I am having him maintain his HYDROXYCHLOROQUINE SULFATE PO, albuterol-ipratropium, multivitamin, DULoxetine HCl (CYMBALTA PO), calcium carbonate, fluticasone, calcitRIOL, vitamin B-12, tamsulosin, Colchicine, atorvastatin, and allopurinol.  Allergies as of 11/19/2018      Reactions   Ciprofloxacin Hives   Definity [perflutren Lipid Microsphere] Nausea And Vomiting   During ECHO with Definity injection pt became nauseated and vomited    Levofloxacin [levofloxacin] Hives      Medication List        Accurate as of 11/19/18  3:56 PM. Always use your most recent med list.          albuterol-ipratropium 18-103 MCG/ACT inhaler Commonly known as:  COMBIVENT Inhale 2 puffs into the lungs every 6 (six) hours as needed. Wheezing    allopurinol 300 MG tablet Commonly known as:  ZYLOPRIM Take 1 tablet (300 mg total) by mouth every morning.   atorvastatin 80 MG tablet Commonly known as:  LIPITOR Take 1 tablet (80 mg total) by mouth daily at 6 PM.   calcitRIOL 0.25 MCG capsule Commonly known as:  ROCALTROL Take 1 capsule (0.25 mcg total) by mouth daily with breakfast.   calcium carbonate 1500 (600 Ca) MG Tabs tablet Commonly known as:  OSCAL Take 600 mg of elemental calcium by mouth 2 (two) times daily with a meal.   celecoxib 200 MG capsule Commonly known as:  CELEBREX Take 1 capsule (200 mg total) by mouth daily. With food   Colchicine 0.6 MG Caps Take 0.6 mg by mouth daily.   CYMBALTA PO Take 30 mg by mouth 2 (two) times daily.   fluticasone 50 MCG/ACT nasal spray Commonly known as:  FLONASE Place 2 sprays into both nostrils daily  as needed for allergies or rhinitis.   furosemide 80 MG tablet Commonly known as:  LASIX Take 0.5 tablets (40 mg total) by mouth daily. May take the extra half for increased swelling   HYDROXYCHLOROQUINE SULFATE PO Take 300 mg by mouth 2 (two) times daily.   multivitamin capsule Take 1 capsule by mouth daily.   pantoprazole 40 MG tablet Commonly known as:  PROTONIX Take 1 tablet (40 mg total) by mouth daily.   tamsulosin 0.4 MG Caps capsule Commonly known as:  FLOMAX Take 1 capsule (0.4 mg total) by mouth at bedtime.   vitamin B-12 500 MCG tablet Commonly known as:  CYANOCOBALAMIN Take 500 mcg by mouth daily.        Follow-up: Return in about 3 months (around 02/19/2019).  [name], M.D.

## 2018-11-19 NOTE — Patient Instructions (Signed)
DO NOT take IBprofen or aleve

## 2018-11-19 NOTE — Addendum Note (Signed)
Addended by: [name] on: 11/19/2018 03:56 PM   Modules accepted: Orders

## 2018-11-30 ENCOUNTER — Other Ambulatory Visit: Payer: Self-pay | Admitting: Family Medicine

## 2019-01-05 ENCOUNTER — Other Ambulatory Visit: Payer: Self-pay | Admitting: Family Medicine

## 2019-01-08 ENCOUNTER — Other Ambulatory Visit: Payer: Self-pay | Admitting: *Deleted

## 2019-01-08 MED ORDER — ATORVASTATIN CALCIUM 80 MG PO TABS: 80.0000 mg | ORAL_TABLET | Freq: Every day | ORAL | 0 refills | Status: DC

## 2019-01-08 MED ORDER — TAMSULOSIN HCL 0.4 MG PO CAPS: 0.4000 mg | ORAL_CAPSULE | Freq: Every day | ORAL | 0 refills | Status: DC

## 2019-01-08 MED ORDER — ALLOPURINOL 300 MG PO TABS: 300.0000 mg | ORAL_TABLET | Freq: Every morning | ORAL | 0 refills | Status: DC

## 2019-01-08 MED ORDER — FLUTICASONE PROPIONATE 50 MCG/ACT NA SUSP: 2.0000 | Freq: Every day | NASAL | 0 refills | Status: DC | PRN

## 2019-01-08 MED ORDER — FUROSEMIDE 80 MG PO TABS: 40.0000 mg | ORAL_TABLET | Freq: Every day | ORAL | 0 refills | Status: DC

## 2019-01-08 MED ORDER — CALCITRIOL 0.25 MCG PO CAPS: 0.2500 ug | ORAL_CAPSULE | Freq: Every day | ORAL | 0 refills | Status: DC

## 2019-01-10 ENCOUNTER — Other Ambulatory Visit: Payer: Self-pay | Admitting: Family Medicine

## 2019-01-10 ENCOUNTER — Other Ambulatory Visit: Payer: Self-pay

## 2019-01-10 MED ORDER — CELECOXIB 200 MG PO CAPS: 200.0000 mg | ORAL_CAPSULE | Freq: Every day | ORAL | 0 refills | Status: DC

## 2019-01-28 ENCOUNTER — Other Ambulatory Visit: Payer: Self-pay | Admitting: Family Medicine

## 2019-02-19 ENCOUNTER — Ambulatory Visit: Payer: Self-pay | Admitting: Family Medicine

## 2019-03-06 ENCOUNTER — Ambulatory Visit (INDEPENDENT_AMBULATORY_CARE_PROVIDER_SITE_OTHER): Payer: Medicare Other | Admitting: Family Medicine

## 2019-03-06 ENCOUNTER — Other Ambulatory Visit: Payer: Self-pay

## 2019-03-06 ENCOUNTER — Encounter: Payer: Self-pay | Admitting: Family Medicine

## 2019-03-06 VITALS — BP 167/74 | HR 68 | Temp 98.5°F | Ht 72.0 in | Wt 246.2 lb

## 2019-03-06 DIAGNOSIS — I10 Essential (primary) hypertension: Secondary | ICD-10-CM | POA: Diagnosis not present

## 2019-03-06 DIAGNOSIS — S065X9A Traumatic subdural hemorrhage with loss of consciousness of unspecified duration, initial encounter: Secondary | ICD-10-CM

## 2019-03-06 DIAGNOSIS — N183 Chronic kidney disease, stage 3 unspecified: Secondary | ICD-10-CM

## 2019-03-06 DIAGNOSIS — I509 Heart failure, unspecified: Secondary | ICD-10-CM | POA: Diagnosis not present

## 2019-03-06 DIAGNOSIS — I609 Nontraumatic subarachnoid hemorrhage, unspecified: Secondary | ICD-10-CM

## 2019-03-06 DIAGNOSIS — N2889 Other specified disorders of kidney and ureter: Secondary | ICD-10-CM

## 2019-03-06 DIAGNOSIS — E1122 Type 2 diabetes mellitus with diabetic chronic kidney disease: Secondary | ICD-10-CM

## 2019-03-06 DIAGNOSIS — J449 Chronic obstructive pulmonary disease, unspecified: Secondary | ICD-10-CM

## 2019-03-06 DIAGNOSIS — S065XAA Traumatic subdural hemorrhage with loss of consciousness status unknown, initial encounter: Secondary | ICD-10-CM

## 2019-03-06 LAB — URINALYSIS
Bilirubin, UA: NEGATIVE
Glucose, UA: NEGATIVE
Ketones, UA: NEGATIVE
Leukocytes, UA: NEGATIVE
Nitrite, UA: NEGATIVE
Specific Gravity, UA: 1.015 (ref 1.005–1.030)
Urobilinogen, Ur: 0.2 mg/dL (ref 0.2–1.0)
pH, UA: 5.5 (ref 5.0–7.5)

## 2019-03-06 MED ORDER — ALLOPURINOL 300 MG PO TABS: 300.0000 mg | ORAL_TABLET | Freq: Every morning | ORAL | 1 refills | Status: DC

## 2019-03-06 MED ORDER — ATORVASTATIN CALCIUM 80 MG PO TABS: 80.0000 mg | ORAL_TABLET | Freq: Every day | ORAL | 1 refills | Status: DC

## 2019-03-06 MED ORDER — CARVEDILOL 6.25 MG PO TABS: 6.2500 mg | ORAL_TABLET | Freq: Two times a day (BID) | ORAL | 1 refills | Status: DC

## 2019-03-06 MED ORDER — SILDENAFIL CITRATE 20 MG PO TABS: 20.0000 mg | ORAL_TABLET | Freq: Every day | ORAL | 5 refills | Status: DC | PRN

## 2019-03-06 MED ORDER — TAMSULOSIN HCL 0.4 MG PO CAPS: 0.4000 mg | ORAL_CAPSULE | Freq: Every day | ORAL | 1 refills | Status: DC

## 2019-03-06 MED ORDER — CELECOXIB 200 MG PO CAPS: 200.0000 mg | ORAL_CAPSULE | Freq: Every day | ORAL | 1 refills | Status: DC

## 2019-03-06 MED ORDER — PANTOPRAZOLE SODIUM 40 MG PO TBEC: 40.0000 mg | DELAYED_RELEASE_TABLET | Freq: Every day | ORAL | 1 refills | Status: DC

## 2019-03-06 NOTE — Progress Notes (Signed)
Subjective:  Patient ID: Mitchell Parker., male    DOB: 04-05-1951  Age: 68 y.o. MRN: [id number]  CC: Medical Management of Chronic Issues   HPI [name] presents for elevated blood pressure noted.  Needs to bring it down prior to surgery coming up on May 12.  He was seen at home by [name] healthcare nurse that took his A1c and found to be 5.0 but did see some protein in his urine.  Currently he is having frontal headaches twice a week.  He has a right-sided retinopathy and apparently surgery is planned for that on May 12.  Depression screen [facility] 2/9 03/06/2019 11/19/2018 10/15/2018  Decreased Interest 0 0 2  Down, Depressed, Hopeless 0 0 0  PHQ - 2 Score 0 0 2  Altered sleeping - - 2  Tired, decreased energy - - 2  Change in appetite - - 1  Feeling bad or failure about yourself  - - 0  Trouble concentrating - - 0  Moving slowly or fidgety/restless - - 0  Suicidal thoughts - - 0  PHQ-9 Score - - 7  Difficult doing work/chores - - Somewhat difficult    History [name] has a past medical history of Abnormal ECG (04/17/2012), Cholelithiasis with cholecystitis (01/04/2012), Chronic anticoagulation (04/10/2017), Chronic diarrhea (04/17/2012), CKD (chronic kidney disease) stage 3, GFR 30-59 ml/min (HCC), COPD (chronic obstructive pulmonary disease) ([place]), Dehydration (04/17/2012), Dizziness (10/08/2012), Essential hypertension, Gallstones, Gout, History of colonic polyps (04/10/2017), History of DVT (deep vein thrombosis), HTN (hypertension), malignant (10/08/2012), Kidney stones, Lumbar disc disease, Neuropathy, Nonischemic cardiomyopathy ([name]), Obesity, Orthostatic hypotension (09/09/2018), RA (rheumatoid arthritis) ([name]), Sleep apnea, Syncope, vasovagal (09/07/2018), Type 2 diabetes mellitus ([name]), and UTI (lower urinary tract infection) (05/23/2012).   He has a past surgical history that includes Umbilical hernia repair; Pilonidal cyst excision (1997); Varicose vein surgery; Cholecystectomy  (01/18/2012); and Bariatric Surgery (01/11/2018).   His family history includes Diabetes in his brother and father; Heart disease in his father; Hypertension in his father and mother; Stroke in his mother.He reports that he quit smoking about 24 years ago. His smoking use included cigarettes. He has never used smokeless tobacco. He reports that he does not drink alcohol or use drugs.    ROS Review of Systems  Constitutional: Negative.   HENT: Negative.   Eyes: Negative for visual disturbance.  Respiratory: Negative for cough and shortness of breath.   Cardiovascular: Negative for chest pain and leg swelling.  Gastrointestinal: Negative for abdominal pain, diarrhea, nausea and vomiting.  Genitourinary: Negative for difficulty urinating.       Frustrated by erectile dysfunction.  Musculoskeletal: Negative for arthralgias and myalgias.  Skin: Negative for rash.  Neurological: Negative for headaches.  Psychiatric/Behavioral: Negative for sleep disturbance.    Objective:  BP (!) 167/74   Pulse 68   Temp 98.5 F (36.9 C) (Oral)   Ht 6' (1.829 m)   Wt 246 lb 4 oz (111.7 kg)   BMI 33.40 kg/m   BP Readings from Last 3 Encounters:  03/06/19 (!) 167/74  11/19/18 (!) 114/40  11/07/18 (!) 142/80    Wt Readings from Last 3 Encounters:  03/06/19 246 lb 4 oz (111.7 kg)  11/19/18 258 lb 3.2 oz (117.1 kg)  11/07/18 253 lb 12.8 oz (115.1 kg)     Physical Exam Vitals signs reviewed.  Constitutional:      Appearance: He is well-developed.  HENT:     Head: Normocephalic and atraumatic.  Right Ear: Tympanic membrane and external ear normal. No decreased hearing noted.     Left Ear: Tympanic membrane and external ear normal. No decreased hearing noted.     Mouth/Throat:     Pharynx: No oropharyngeal exudate or posterior oropharyngeal erythema.  Eyes:     Pupils: Pupils are equal, round, and reactive to light.  Neck:     Musculoskeletal: Normal range of motion and neck supple.   Cardiovascular:     Rate and Rhythm: Normal rate and regular rhythm.     Heart sounds: No murmur.  Pulmonary:     Effort: No respiratory distress.     Breath sounds: Normal breath sounds.  Abdominal:     General: Bowel sounds are normal.     Palpations: Abdomen is soft. There is no mass.     Tenderness: There is no abdominal tenderness.  Musculoskeletal:     Right lower leg: Edema present.     Left lower leg: Edema present.       Assessment & Plan:   Atilano was seen today for medical management of chronic issues.  Diagnoses and all orders for this visit:  HTN (hypertension), malignant -     CBC with Differential/Platelet -     CMP14+EGFR -     Urinalysis  Chronic obstructive pulmonary disease, unspecified COPD type (HCC)  Congestive heart failure, unspecified HF chronicity, unspecified heart failure type ([place])  Type 2 diabetes mellitus with stage 3 chronic kidney disease, without long-term current use of insulin (HCC) -     Lipid panel  Obesity, morbid (HCC)  Chronic renal impairment, stage 3 (moderate) (HCC)  Subarachnoid hemorrhage (HCC)  Subdural hematoma (HCC)  Other orders -     allopurinol (ZYLOPRIM) 300 MG tablet; Take 1 tablet (300 mg total) by mouth every morning. -     atorvastatin (LIPITOR) 80 MG tablet; Take 1 tablet (80 mg total) by mouth daily at 6 PM. -     celecoxib (CELEBREX) 200 MG capsule; Take 1 capsule (200 mg total) by mouth daily. With food -     pantoprazole (PROTONIX) 40 MG tablet; Take 1 tablet (40 mg total) by mouth daily. -     tamsulosin (FLOMAX) 0.4 MG CAPS capsule; Take 1 capsule (0.4 mg total) by mouth at bedtime. -     sildenafil (REVATIO) 20 MG tablet; Take 1 tablet (20 mg total) by mouth daily as needed (2-5 as needed). -     carvedilol (COREG) 6.25 MG tablet; Take 1 tablet (6.25 mg total) by mouth 2 (two) times daily with a meal. For blood pressure &/or kidney function       I have discontinued [name] "bill"'s  diclofenac. I have also changed his pantoprazole. Additionally, I am having him start on sildenafil and carvedilol. Lastly, I am having him maintain his HYDROXYCHLOROQUINE SULFATE PO, albuterol-ipratropium, multivitamin, calcium carbonate, vitamin B-12, Colchicine, calcitRIOL, fluticasone, mometasone, neomycin-polymyxin-hydrocortisone, furosemide, allopurinol, atorvastatin, celecoxib, and tamsulosin.  Allergies as of 03/06/2019      Reactions   Ciprofloxacin Hives   Definity [perflutren Lipid Microsphere] Nausea And Vomiting   During ECHO with Definity injection pt became nauseated and vomited    Levofloxacin [levofloxacin] Hives      Medication List       Accurate as of March 06, 2019 11:59 PM. Always use your most recent med list.        albuterol-ipratropium 18-103 MCG/ACT inhaler Commonly known as:  COMBIVENT Inhale 2 puffs into the lungs  every 6 (six) hours as needed. Wheezing   allopurinol 300 MG tablet Commonly known as:  ZYLOPRIM Take 1 tablet (300 mg total) by mouth every morning.   atorvastatin 80 MG tablet Commonly known as:  LIPITOR Take 1 tablet (80 mg total) by mouth daily at 6 PM.   calcitRIOL 0.25 MCG capsule Commonly known as:  ROCALTROL Take 1 capsule (0.25 mcg total) by mouth daily with breakfast.   calcium carbonate 1500 (600 Ca) MG Tabs tablet Commonly known as:  OSCAL Take 600 mg of elemental calcium by mouth 2 (two) times daily with a meal.   carvedilol 6.25 MG tablet Commonly known as:  COREG Take 1 tablet (6.25 mg total) by mouth 2 (two) times daily with a meal. For blood pressure &/or kidney function   celecoxib 200 MG capsule Commonly known as:  CELEBREX Take 1 capsule (200 mg total) by mouth daily. With food   Colchicine 0.6 MG Caps Take 0.6 mg by mouth daily.   CYMBALTA PO Take 30 mg by mouth 2 (two) times daily.   fluticasone 50 MCG/ACT nasal spray Commonly known as:  Flonase Place 2 sprays into both nostrils daily as needed for  allergies or rhinitis.   furosemide 40 MG tablet Commonly known as:  LASIX Take 40 mg by mouth daily.   HYDROXYCHLOROQUINE SULFATE PO Take 300 mg by mouth 2 (two) times daily.   mometasone 0.1 % cream Commonly known as:  ELOCON APPLY TWICE DAILY   multivitamin capsule Take 1 capsule by mouth daily.   neomycin-polymyxin-hydrocortisone 3.5-10000-1 OTIC suspension Commonly known as:  CORTISPORIN PLACE 4 DROPS INTO AFFECTED EAR TWICE DAILY FOR 10 DAYS   pantoprazole 40 MG tablet Commonly known as:  PROTONIX Take 1 tablet (40 mg total) by mouth daily.   sildenafil 20 MG tablet Commonly known as:  REVATIO Take 1 tablet (20 mg total) by mouth daily as needed (2-5 as needed).   tamsulosin 0.4 MG Caps capsule Commonly known as:  FLOMAX Take 1 capsule (0.4 mg total) by mouth at bedtime.   vitamin B-12 500 MCG tablet Commonly known as:  CYANOCOBALAMIN Take 500 mcg by mouth daily.        Follow-up: Return in about 1 month (around 04/06/2019).  [name], M.D.

## 2019-03-06 NOTE — Patient Instructions (Signed)
DASH Eating Plan  DASH stands for "Dietary Approaches to Stop Hypertension." The DASH eating plan is a healthy eating plan that has been shown to reduce high blood pressure (hypertension). It may also reduce your risk for type 2 diabetes, heart disease, and stroke. The DASH eating plan may also help with weight loss.  What are tips for following this plan?    General guidelines   Avoid eating more than 2,300 mg (milligrams) of salt (sodium) a day. If you have hypertension, you may need to reduce your sodium intake to 1,500 mg a day.   Limit alcohol intake to no more than 1 drink a day for nonpregnant women and 2 drinks a day for men. One drink equals 12 oz of beer, 5 oz of wine, or 1 oz of hard liquor.   Work with your health care provider to maintain a healthy body weight or to lose weight. Ask what an ideal weight is for you.   Get at least 30 minutes of exercise that causes your heart to beat faster (aerobic exercise) most days of the week. Activities may include walking, swimming, or biking.   Work with your health care provider or diet and nutrition specialist (dietitian) to adjust your eating plan to your individual calorie needs.  Reading food labels     Check food labels for the amount of sodium per serving. Choose foods with less than 5 percent of the Daily Value of sodium. Generally, foods with less than 300 mg of sodium per serving fit into this eating plan.   To find whole grains, look for the word "whole" as the first word in the ingredient list.  Shopping   Buy products labeled as "low-sodium" or "no salt added."   Buy fresh foods. Avoid canned foods and premade or frozen meals.  Cooking   Avoid adding salt when cooking. Use salt-free seasonings or herbs instead of table salt or sea salt. Check with your health care provider or pharmacist before using salt substitutes.   Do not fry foods. Cook foods using healthy methods such as baking, boiling, grilling, and broiling instead.   Cook with  heart-healthy oils, such as olive, canola, soybean, or sunflower oil.  Meal planning   Eat a balanced diet that includes:  ? 5 or more servings of fruits and vegetables each day. At each meal, try to fill half of your plate with fruits and vegetables.  ? Up to 6-8 servings of whole grains each day.  ? Less than 6 oz of lean meat, poultry, or fish each day. A 3-oz serving of meat is about the same size as a deck of cards. One egg equals 1 oz.  ? 2 servings of low-fat dairy each day.  ? A serving of nuts, seeds, or beans 5 times each week.  ? Heart-healthy fats. Healthy fats called Omega-3 fatty acids are found in foods such as flaxseeds and coldwater fish, like sardines, salmon, and mackerel.   Limit how much you eat of the following:  ? Canned or prepackaged foods.  ? Food that is high in trans fat, such as fried foods.  ? Food that is high in saturated fat, such as fatty meat.  ? Sweets, desserts, sugary drinks, and other foods with added sugar.  ? Full-fat dairy products.   Do not salt foods before eating.   Try to eat at least 2 vegetarian meals each week.   Eat more home-cooked food and less restaurant, buffet, and fast food.     When eating at a restaurant, ask that your food be prepared with less salt or no salt, if possible.  What foods are recommended?  The items listed may not be a complete list. Talk with your dietitian about what dietary choices are best for you.  Grains  Whole-grain or whole-wheat bread. Whole-grain or whole-wheat pasta. Brown rice. Oatmeal. Quinoa. Bulgur. Whole-grain and low-sodium cereals. Pita bread. Low-fat, low-sodium crackers. Whole-wheat flour tortillas.  Vegetables  Fresh or frozen vegetables (raw, steamed, roasted, or grilled). Low-sodium or reduced-sodium tomato and vegetable juice. Low-sodium or reduced-sodium tomato sauce and tomato paste. Low-sodium or reduced-sodium canned vegetables.  Fruits  All fresh, dried, or frozen fruit. Canned fruit in natural juice (without  added sugar).  Meat and other protein foods  Skinless chicken or turkey. Ground chicken or turkey. Pork with fat trimmed off. Fish and seafood. Egg whites. Dried beans, peas, or lentils. Unsalted nuts, nut butters, and seeds. Unsalted canned beans. Lean cuts of beef with fat trimmed off. Low-sodium, lean deli meat.  Dairy  Low-fat (1%) or fat-free (skim) milk. Fat-free, low-fat, or reduced-fat cheeses. Nonfat, low-sodium ricotta or cottage cheese. Low-fat or nonfat yogurt. Low-fat, low-sodium cheese.  Fats and oils  Soft margarine without trans fats. Vegetable oil. Low-fat, reduced-fat, or light mayonnaise and salad dressings (reduced-sodium). Canola, safflower, olive, soybean, and sunflower oils. Avocado.  Seasoning and other foods  Herbs. Spices. Seasoning mixes without salt. Unsalted popcorn and pretzels. Fat-free sweets.  What foods are not recommended?  The items listed may not be a complete list. Talk with your dietitian about what dietary choices are best for you.  Grains  Baked goods made with fat, such as croissants, muffins, or some breads. Dry pasta or rice meal packs.  Vegetables  Creamed or fried vegetables. Vegetables in a cheese sauce. Regular canned vegetables (not low-sodium or reduced-sodium). Regular canned tomato sauce and paste (not low-sodium or reduced-sodium). Regular tomato and vegetable juice (not low-sodium or reduced-sodium). Pickles. Olives.  Fruits  Canned fruit in a light or heavy syrup. Fried fruit. Fruit in cream or butter sauce.  Meat and other protein foods  Fatty cuts of meat. Ribs. Fried meat. Bacon. Sausage. Bologna and other processed lunch meats. Salami. Fatback. Hotdogs. Bratwurst. Salted nuts and seeds. Canned beans with added salt. Canned or smoked fish. Whole eggs or egg yolks. Chicken or turkey with skin.  Dairy  Whole or 2% milk, cream, and half-and-half. Whole or full-fat cream cheese. Whole-fat or sweetened yogurt. Full-fat cheese. Nondairy creamers. Whipped toppings.  Processed cheese and cheese spreads.  Fats and oils  Butter. Stick margarine. Lard. Shortening. Ghee. Bacon fat. Tropical oils, such as coconut, palm kernel, or palm oil.  Seasoning and other foods  Salted popcorn and pretzels. Onion salt, garlic salt, seasoned salt, table salt, and sea salt. Worcestershire sauce. Tartar sauce. Barbecue sauce. Teriyaki sauce. Soy sauce, including reduced-sodium. Steak sauce. Canned and packaged gravies. Fish sauce. Oyster sauce. Cocktail sauce. Horseradish that you find on the shelf. Ketchup. Mustard. Meat flavorings and tenderizers. Bouillon cubes. Hot sauce and Tabasco sauce. Premade or packaged marinades. Premade or packaged taco seasonings. Relishes. Regular salad dressings.  Where to find more information:   National Heart, Lung, and Blood Institute: www.nhlbi.nih.gov   American Heart Association: www.heart.org  Summary   The DASH eating plan is a healthy eating plan that has been shown to reduce high blood pressure (hypertension). It may also reduce your risk for type 2 diabetes, heart disease, and stroke.   With the   DASH eating plan, you should limit salt (sodium) intake to 2,300 mg a day. If you have hypertension, you may need to reduce your sodium intake to 1,500 mg a day.   When on the DASH eating plan, aim to eat more fresh fruits and vegetables, whole grains, lean proteins, low-fat dairy, and heart-healthy fats.   Work with your health care provider or diet and nutrition specialist (dietitian) to adjust your eating plan to your individual calorie needs.  This information is not intended to replace advice given to you by your health care provider. Make sure you discuss any questions you have with your health care provider.  Document Released: 12/01/2011 Document Revised: 12/05/2016 Document Reviewed: 12/05/2016  Elsevier Interactive Patient Education  2019 [employer]

## 2019-03-07 LAB — CBC WITH DIFFERENTIAL/PLATELET
Basophils Absolute: 0 10*3/uL (ref 0.0–0.2)
Basos: 1 %
EOS (ABSOLUTE): 0.1 10*3/uL (ref 0.0–0.4)
Eos: 2 %
Hematocrit: 39.3 % (ref 37.5–51.0)
Hemoglobin: 12.8 g/dL — ABNORMAL LOW (ref 13.0–17.7)
Immature Grans (Abs): 0 10*3/uL (ref 0.0–0.1)
Immature Granulocytes: 0 %
Lymphocytes Absolute: 0.8 10*3/uL (ref 0.7–3.1)
Lymphs: 21 %
MCH: 29.9 pg (ref 26.6–33.0)
MCHC: 32.6 g/dL (ref 31.5–35.7)
MCV: 92 fL (ref 79–97)
Monocytes Absolute: 0.4 10*3/uL (ref 0.1–0.9)
Monocytes: 9 %
Neutrophils Absolute: 2.7 10*3/uL (ref 1.4–7.0)
Neutrophils: 67 %
Platelets: 177 10*3/uL (ref 150–450)
RBC: 4.28 x10E6/uL (ref 4.14–5.80)
RDW: 13.3 % (ref 11.6–15.4)
WBC: 4 10*3/uL (ref 3.4–10.8)

## 2019-03-07 LAB — CMP14+EGFR
ALT: 33 IU/L (ref 0–44)
AST: 40 IU/L (ref 0–40)
Albumin/Globulin Ratio: 1.5 (ref 1.2–2.2)
Albumin: 3.7 g/dL — ABNORMAL LOW (ref 3.8–4.8)
Alkaline Phosphatase: 68 IU/L (ref 39–117)
BUN/Creatinine Ratio: 22 (ref 10–24)
BUN: 22 mg/dL (ref 8–27)
Bilirubin Total: 0.6 mg/dL (ref 0.0–1.2)
CO2: 29 mmol/L (ref 20–29)
Calcium: 8.6 mg/dL (ref 8.6–10.2)
Chloride: 99 mmol/L (ref 96–106)
Creatinine, Ser: 1.01 mg/dL (ref 0.76–1.27)
GFR calc Af Amer: 90 mL/min/{1.73_m2} (ref >59)
GFR calc non Af Amer: 78 mL/min/{1.73_m2} (ref >59)
Globulin, Total: 2.4 g/dL (ref 1.5–4.5)
Glucose: 109 mg/dL — ABNORMAL HIGH (ref 65–99)
Potassium: 3.3 mmol/L — ABNORMAL LOW (ref 3.5–5.2)
Sodium: 142 mmol/L (ref 134–144)
Total Protein: 6.1 g/dL (ref 6.0–8.5)

## 2019-03-07 LAB — LIPID PANEL
Chol/HDL Ratio: 2.4 ratio (ref 0.0–5.0)
Cholesterol, Total: 104 mg/dL (ref 100–199)
HDL: 44 mg/dL (ref >39)
LDL Calculated: 48 mg/dL (ref 0–99)
Triglycerides: 58 mg/dL (ref 0–149)
VLDL Cholesterol Cal: 12 mg/dL (ref 5–40)

## 2019-03-08 NOTE — Progress Notes (Signed)
Hello Mitchell Parker,  Your lab result is normal.Some minor variations that are not significant are commonly marked abnormal, but do not represent any medical problem for you.  Best regards, Telia Amundson, M.D.

## 2019-03-12 ENCOUNTER — Encounter: Payer: Self-pay | Admitting: Family Medicine

## 2019-03-12 ENCOUNTER — Other Ambulatory Visit: Payer: Self-pay | Admitting: Family Medicine

## 2019-03-12 ENCOUNTER — Telehealth: Payer: Self-pay | Admitting: *Deleted

## 2019-03-12 MED ORDER — DULOXETINE HCL 30 MG PO CPEP: 30.0000 mg | ORAL_CAPSULE | Freq: Two times a day (BID) | ORAL | 3 refills | Status: DC

## 2019-03-12 NOTE — Telephone Encounter (Signed)
Fax from [facility] RF on Duloxetine with no strength listed Historical med Please advise

## 2019-03-26 ENCOUNTER — Other Ambulatory Visit: Payer: Self-pay | Admitting: Family Medicine

## 2019-03-28 ENCOUNTER — Other Ambulatory Visit: Payer: Self-pay | Admitting: *Deleted

## 2019-03-28 MED ORDER — COLCHICINE 0.6 MG PO CAPS: 0.6000 mg | ORAL_CAPSULE | Freq: Every day | ORAL | 1 refills | Status: DC

## 2019-04-04 ENCOUNTER — Other Ambulatory Visit: Payer: Self-pay | Admitting: Family Medicine

## 2019-04-09 ENCOUNTER — Other Ambulatory Visit: Payer: Self-pay

## 2019-04-09 ENCOUNTER — Ambulatory Visit (INDEPENDENT_AMBULATORY_CARE_PROVIDER_SITE_OTHER): Payer: Medicare Other | Admitting: Family Medicine

## 2019-04-09 ENCOUNTER — Encounter: Payer: Self-pay | Admitting: Family Medicine

## 2019-04-09 DIAGNOSIS — J449 Chronic obstructive pulmonary disease, unspecified: Secondary | ICD-10-CM

## 2019-04-09 DIAGNOSIS — I10 Essential (primary) hypertension: Secondary | ICD-10-CM

## 2019-04-09 DIAGNOSIS — E876 Hypokalemia: Secondary | ICD-10-CM

## 2019-04-09 MED ORDER — CARVEDILOL 12.5 MG PO TABS: 12.5000 mg | ORAL_TABLET | Freq: Two times a day (BID) | ORAL | 1 refills | Status: DC

## 2019-04-09 MED ORDER — POTASSIUM CHLORIDE CRYS ER 20 MEQ PO TBCR: 20.0000 meq | EXTENDED_RELEASE_TABLET | Freq: Every day | ORAL | 1 refills | Status: DC

## 2019-04-09 NOTE — Progress Notes (Signed)
Subjective:  Patient ID: Mitchell Jarred., male    DOB: 1951/10/04  Age: 68 y.o. MRN: [id number]  CC: No chief complaint on file.   HPI Mitchell HATT Sr. presents for  follow-up of hypertension. Patient has no history of headache chest pain or shortness of breath or recent cough. Patient also denies symptoms of TIA such as focal numbness or weakness. Patient denies side effects from medication. States taking it regularly. BP at home 178/85. 1/2 hour ago. Wt. 236 Glucose 88 fasting this morning and usually between 80-90. Denies low readings. No symptoms. Working on weight loss by portion control. Down 10 lb.    History [place] has a past medical history of Abnormal ECG (04/17/2012), Cholelithiasis with cholecystitis (01/04/2012), Chronic anticoagulation (04/10/2017), Chronic diarrhea (04/17/2012), CKD (chronic kidney disease) stage 3, GFR 30-59 ml/min (HCC), COPD (chronic obstructive pulmonary disease) (HCC), Dehydration (04/17/2012), Dizziness (10/08/2012), Essential hypertension, Gallstones, Gout, History of colonic polyps (04/10/2017), History of DVT (deep vein thrombosis), HTN (hypertension), malignant (10/08/2012), Kidney stones, Lumbar disc disease, Neuropathy, Nonischemic cardiomyopathy (HCC), Obesity, Orthostatic hypotension (09/09/2018), RA (rheumatoid arthritis) (HCC), Sleep apnea, Syncope, vasovagal (09/07/2018), Type 2 diabetes mellitus (HCC), and UTI (lower urinary tract infection) (05/23/2012).   He has a past surgical history that includes Umbilical hernia repair; Pilonidal cyst excision (1997); Varicose vein surgery; Cholecystectomy (01/18/2012); and Bariatric Surgery (01/11/2018).   His family history includes Diabetes in his brother and father; Heart disease in his father; Hypertension in his father and mother; Stroke in his mother.He reports that he quit smoking about 24 years ago. His smoking use included cigarettes. He has never used smokeless tobacco. He reports that he does not drink  alcohol or use drugs.  Current Outpatient Medications on File Prior to Visit  Medication Sig Dispense Refill  . albuterol-ipratropium (COMBIVENT) 18-103 MCG/ACT inhaler Inhale 2 puffs into the lungs every 6 (six) hours as needed. Wheezing    . allopurinol (ZYLOPRIM) 300 MG tablet Take 1 tablet (300 mg total) by mouth every morning. 90 tablet 1  . atorvastatin (LIPITOR) 80 MG tablet Take 1 tablet (80 mg total) by mouth daily at 6 PM. 90 tablet 1  . calcitRIOL (ROCALTROL) 0.25 MCG capsule TAKE 1 CAPSULE BY MOUTH  DAILY WITH BREAKFAST 90 capsule 0  . calcium carbonate (OSCAL) 1500 (600 Ca) MG TABS tablet Take 600 mg of elemental calcium by mouth 2 (two) times daily with a meal.    . celecoxib (CELEBREX) 200 MG capsule Take 1 capsule (200 mg total) by mouth daily. With food 90 capsule 1  . Colchicine 0.6 MG CAPS Take 0.6 mg by mouth daily. 90 capsule 1  . DULoxetine (CYMBALTA) 30 MG capsule Take 1 capsule (30 mg total) by mouth 2 (two) times daily. 180 capsule 3  . fluticasone (FLONASE) 50 MCG/ACT nasal spray Place 2 sprays into both nostrils daily as needed for allergies or rhinitis. 48 g 0  . furosemide (LASIX) 40 MG tablet Take 40 mg by mouth daily.    [name] HYDROXYCHLOROQUINE SULFATE PO Take 300 mg by mouth 2 (two) times daily.    . mometasone (ELOCON) 0.1 % cream APPLY TWICE DAILY 45 g 0  . Multiple Vitamin (MULTIVITAMIN) capsule Take 1 capsule by mouth daily.    [name] neomycin-polymyxin-hydrocortisone (CORTISPORIN) 3.5-10000-1 OTIC suspension PLACE 4 DROPS INTO AFFECTED EAR TWICE DAILY FOR 10 DAYS 10 mL 0  . pantoprazole (PROTONIX) 40 MG tablet Take 1 tablet (40 mg total) by mouth daily. 90 tablet 1  .  sildenafil (REVATIO) 20 MG tablet Take 1 tablet (20 mg total) by mouth daily as needed (2-5 as needed). 50 tablet 5  . tamsulosin (FLOMAX) 0.4 MG CAPS capsule Take 1 capsule (0.4 mg total) by mouth at bedtime. 90 capsule 1  . vitamin B-12 (CYANOCOBALAMIN) 500 MCG tablet Take 500 mcg by mouth daily.      No current facility-administered medications on file prior to visit.     ROS Review of Systems  Constitutional: Negative for fever.  Respiratory: Negative for shortness of breath.   Cardiovascular: Negative for chest pain.  Musculoskeletal: Negative for arthralgias.  Skin: Negative for rash.    Objective:  There were no vitals taken for this visit.  BP Readings from Last 3 Encounters:  03/06/19 (!) 167/74  11/19/18 (!) 114/40  11/07/18 (!) 142/80    Wt Readings from Last 3 Encounters:  03/06/19 246 lb 4 oz (111.7 kg)  11/19/18 258 lb 3.2 oz (117.1 kg)  11/07/18 253 lb 12.8 oz (115.1 kg)     Physical Exam  Phone visit  Assessment & Plan:   Diagnoses and all orders for this visit:  Essential hypertension  Hypokalemia  Chronic obstructive pulmonary disease, unspecified COPD type (HCC)  Other orders -     carvedilol (COREG) 12.5 MG tablet; Take 1 tablet (12.5 mg total) by mouth 2 (two) times daily with a meal. For blood pressure &/or kidney function -     potassium chloride SA (K-DUR,KLOR-CON) 20 MEQ tablet; Take 1 tablet (20 mEq total) by mouth daily. For potassium replacement/ supplement   Allergies as of 04/09/2019      Reactions   Ciprofloxacin Hives   Definity [perflutren Lipid Microsphere] Nausea And Vomiting   During ECHO with Definity injection pt became nauseated and vomited    Levofloxacin [levofloxacin] Hives      Medication List       Accurate as of April 09, 2019  1:33 PM. Always use your most recent med list.        albuterol-ipratropium 18-103 MCG/ACT inhaler Commonly known as:  COMBIVENT Inhale 2 puffs into the lungs every 6 (six) hours as needed. Wheezing   allopurinol 300 MG tablet Commonly known as:  ZYLOPRIM Take 1 tablet (300 mg total) by mouth every morning.   atorvastatin 80 MG tablet Commonly known as:  LIPITOR Take 1 tablet (80 mg total) by mouth daily at 6 PM.   calcitRIOL 0.25 MCG capsule Commonly known as:  ROCALTROL  TAKE 1 CAPSULE BY MOUTH  DAILY WITH BREAKFAST   calcium carbonate 1500 (600 Ca) MG Tabs tablet Commonly known as:  OSCAL Take 600 mg of elemental calcium by mouth 2 (two) times daily with a meal.   carvedilol 12.5 MG tablet Commonly known as:  COREG Take 1 tablet (12.5 mg total) by mouth 2 (two) times daily with a meal. For blood pressure &/or kidney function   celecoxib 200 MG capsule Commonly known as:  CELEBREX Take 1 capsule (200 mg total) by mouth daily. With food   Colchicine 0.6 MG Caps Take 0.6 mg by mouth daily.   DULoxetine 30 MG capsule Commonly known as:  Cymbalta Take 1 capsule (30 mg total) by mouth 2 (two) times daily.   fluticasone 50 MCG/ACT nasal spray Commonly known as:  Flonase Place 2 sprays into both nostrils daily as needed for allergies or rhinitis.   furosemide 40 MG tablet Commonly known as:  LASIX Take 40 mg by mouth daily.   HYDROXYCHLOROQUINE SULFATE PO  Take 300 mg by mouth 2 (two) times daily.   mometasone 0.1 % cream Commonly known as:  ELOCON APPLY TWICE DAILY   multivitamin capsule Take 1 capsule by mouth daily.   neomycin-polymyxin-hydrocortisone 3.5-10000-1 OTIC suspension Commonly known as:  CORTISPORIN PLACE 4 DROPS INTO AFFECTED EAR TWICE DAILY FOR 10 DAYS   pantoprazole 40 MG tablet Commonly known as:  PROTONIX Take 1 tablet (40 mg total) by mouth daily.   potassium chloride SA 20 MEQ tablet Commonly known as:  K-DUR,KLOR-CON Take 1 tablet (20 mEq total) by mouth daily. For potassium replacement/ supplement   sildenafil 20 MG tablet Commonly known as:  REVATIO Take 1 tablet (20 mg total) by mouth daily as needed (2-5 as needed).   tamsulosin 0.4 MG Caps capsule Commonly known as:  FLOMAX Take 1 capsule (0.4 mg total) by mouth at bedtime.   vitamin B-12 500 MCG tablet Commonly known as:  CYANOCOBALAMIN Take 500 mcg by mouth daily.       Meds ordered this encounter  Medications  . carvedilol (COREG) 12.5 MG  tablet    Sig: Take 1 tablet (12.5 mg total) by mouth 2 (two) times daily with a meal. For blood pressure &/or kidney function    Dispense:  180 tablet    Refill:  1  . potassium chloride SA (K-DUR,KLOR-CON) 20 MEQ tablet    Sig: Take 1 tablet (20 mEq total) by mouth daily. For potassium replacement/ supplement    Dispense:  90 tablet    Refill:  1    Virtual Visit via telephone Note  I discussed the limitations, risks, security and privacy concerns of performing an evaluation and management service by telephone and the availability of in person appointments. I also discussed with the patient that there may be a patient responsible charge related to this service. The patient expressed understanding and agreed to proceed. Pt. Is at home. Dr. Darlyn Read is in his office.  Follow Up Instructions:   I discussed the assessment and treatment plan with the patient. The patient was provided an opportunity to ask questions and all were answered. The patient agreed with the plan and demonstrated an understanding of the instructions.   The patient was advised to call back or seek an in-person evaluation if the symptoms worsen or if the condition fails to improve as anticipated.  Visit started: 1:13  Call ended:  1:30 Total minutes including chart review and phone contact time: 17   Follow-up: Return in about 1 month (around 05/09/2019).  [name], M.D.

## 2019-04-12 ENCOUNTER — Other Ambulatory Visit (HOSPITAL_COMMUNITY): Payer: Self-pay | Admitting: Optometry

## 2019-04-12 ENCOUNTER — Other Ambulatory Visit: Payer: Self-pay | Admitting: Optometry

## 2019-04-12 DIAGNOSIS — Z0189 Encounter for other specified special examinations: Secondary | ICD-10-CM

## 2019-04-17 ENCOUNTER — Other Ambulatory Visit (HOSPITAL_COMMUNITY): Payer: Self-pay | Admitting: Optometry

## 2019-04-17 DIAGNOSIS — Z0189 Encounter for other specified special examinations: Secondary | ICD-10-CM

## 2019-04-17 IMAGING — CT CT HEAD W/O CM
3 series · 16 of 47 positions shown, 19 images · non-contrast
Comparison: CT brain scan of 09/08/2018

CLINICAL DATA: Follow-up subdural hematoma, fell on [DATE] striking
the back of the head, intermittent headaches

EXAM:
CT HEAD WITHOUT CONTRAST
TECHNIQUE: Contiguous axial images were obtained from the base of the skull
through the vertex without intravenous contrast.

[Series 2: head wo · axial · 0.46mm/px · z∈[+1623,+1753]mm · 10 of 32 slices shown, 13 images]
[im 3/32  brain]
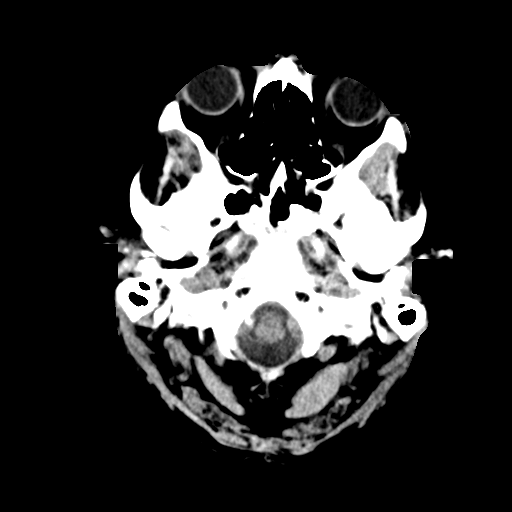
[im 3/32  bone]
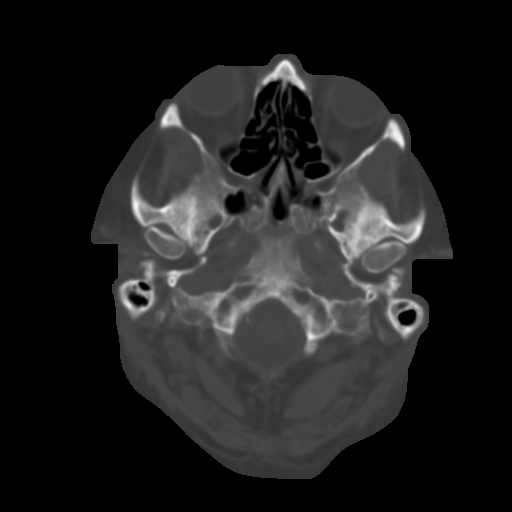
[im 6/32  brain]
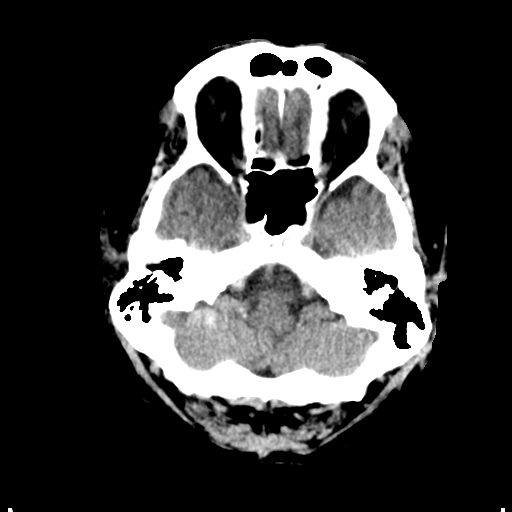
[im 9/32  brain]
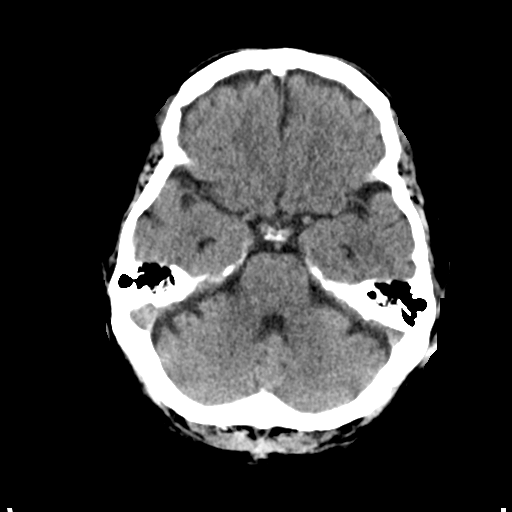
[im 11/32  brain]
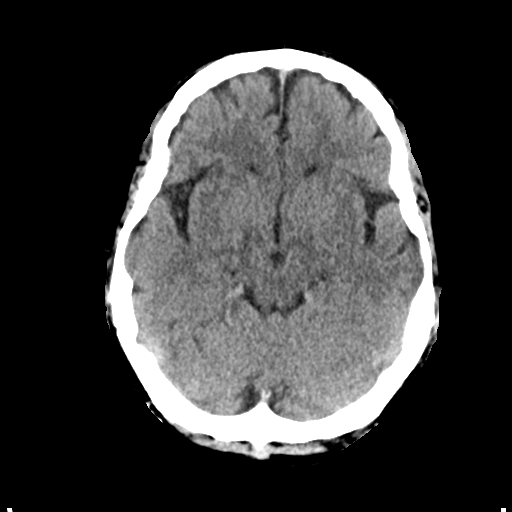
[im 14/32  brain]
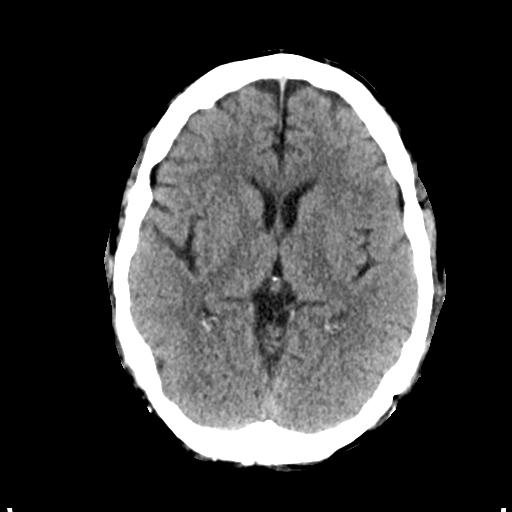
[im 14/32  bone]
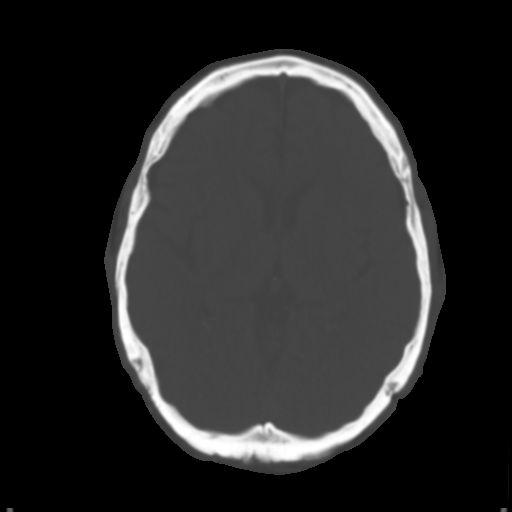
[im 18/32  brain]
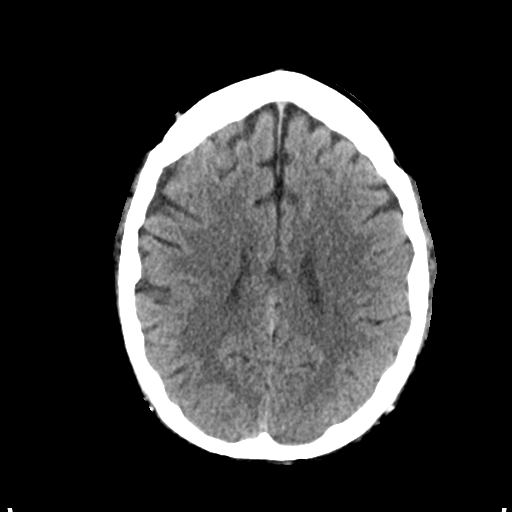
[im 21/32  brain]
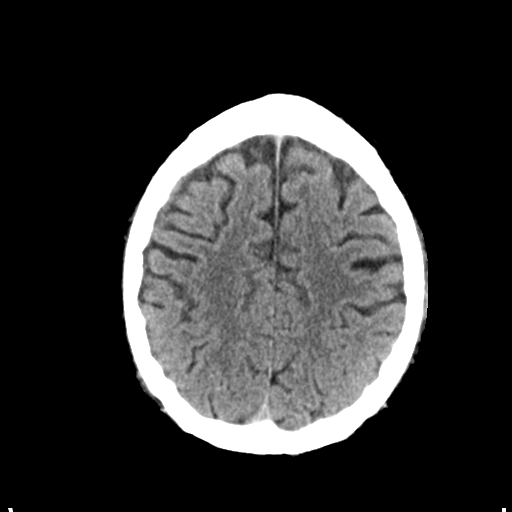
[im 24/32  brain]
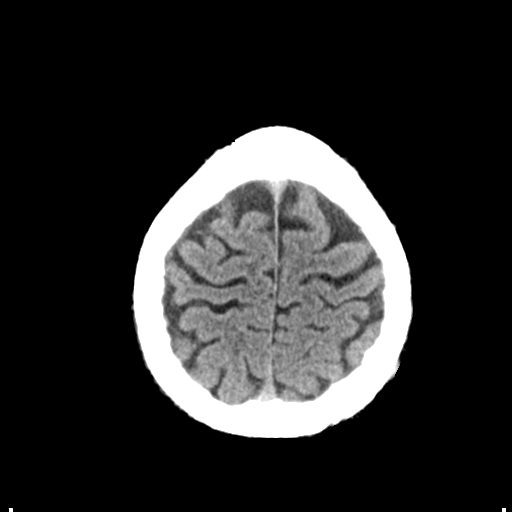
[im 26/32  brain]
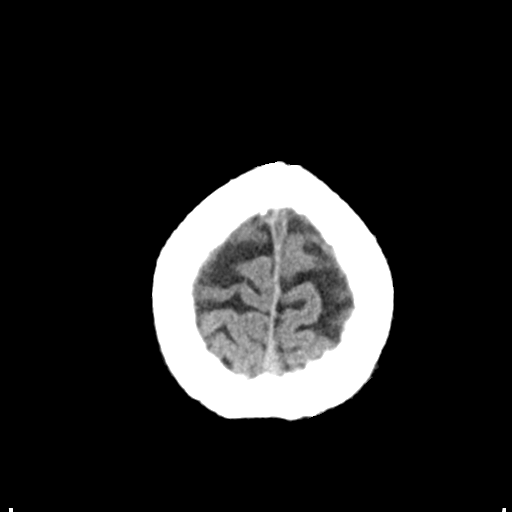
[im 26/32  bone]
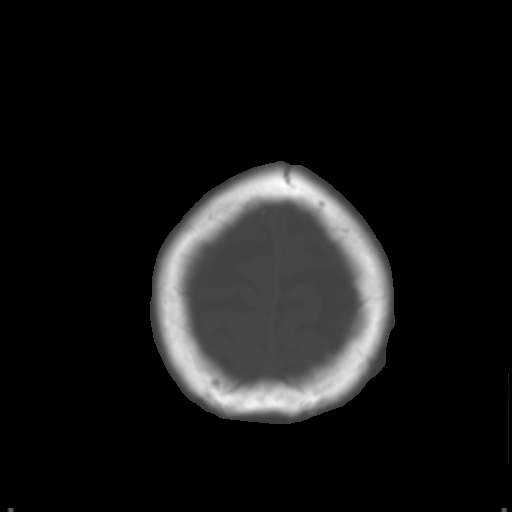
[im 29/32  brain]
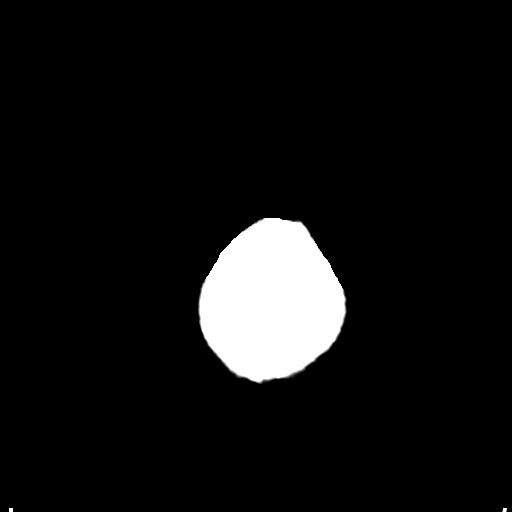

[Series 4: coronal soft tissue · coronal · 0.35mm/px · 3 of 69 slices shown]
[im 23/69  brain]
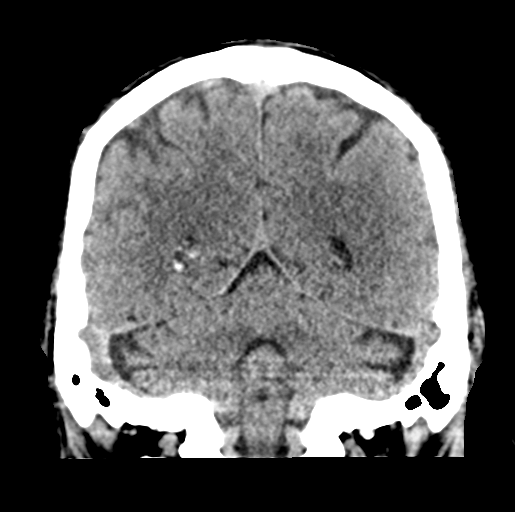
[im 31/69  brain]
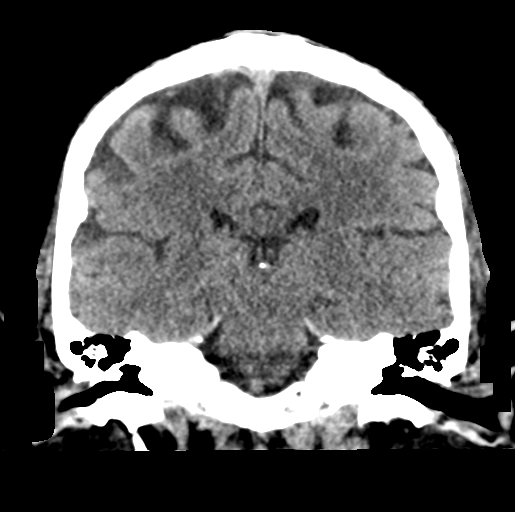
[im 38/69  brain]
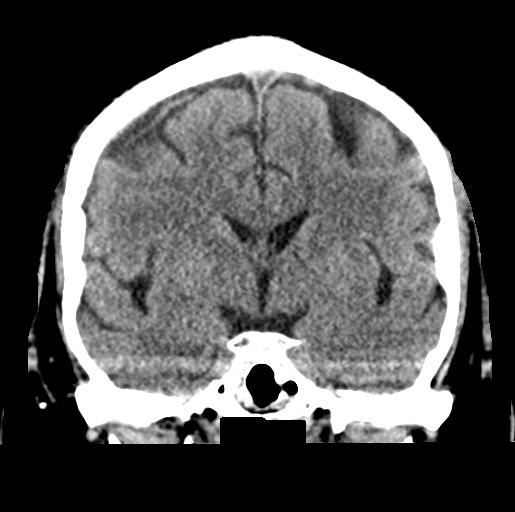

[Series 5: sagittal soft tissue · sagittal · 0.33mm/px · 3 of 58 slices shown]
[im 20/58  brain]
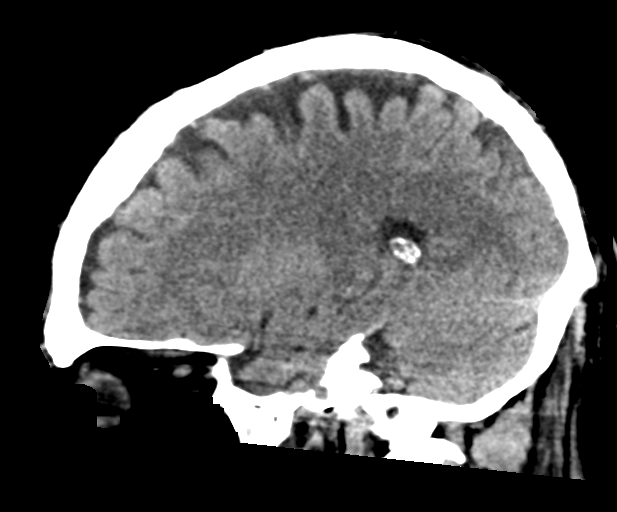
[im 29/58  brain]
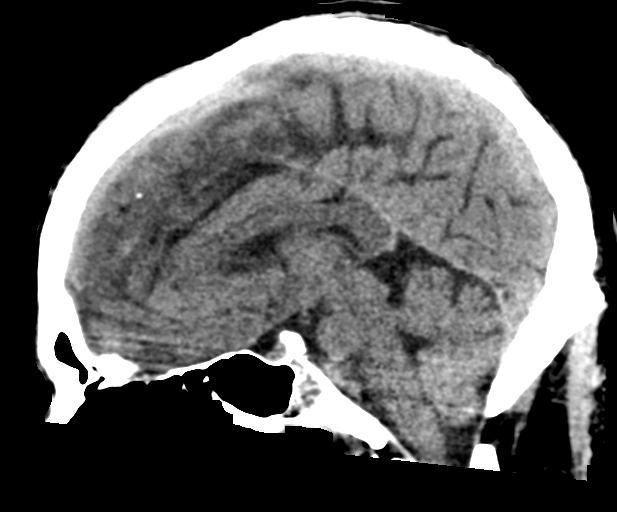
[im 39/58  brain]
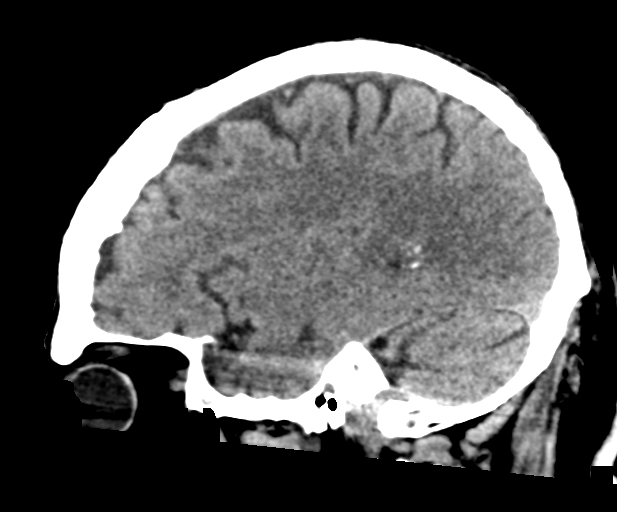

[16 of 47 positions shown; findings below may reference images not displayed]

FINDINGS: Brain: The ventricular system is normal in size and configuration
and the septum is midline in position. The fourth ventricle and
basilar cisterns are unremarkable. The previously noted small left
tentorial subdural hematoma has resolved. No subarachnoid blood is
currently seen. No acute interval change is noted. No hemorrhage,
mass lesion, or acute infarction is currently seen.

Vascular: No vascular abnormality is noted on this unenhanced study

Skull: On bone window images, no calvarial abnormality is seen.

Sinuses/Orbits: The paranasal sinuses appear well pneumatized.

Other: None.
IMPRESSION: Interval resolution of small left tentorial subdural hematoma and
the previous small amount of subarachnoid blood. No acute
abnormality is evident.

## 2019-04-26 ENCOUNTER — Other Ambulatory Visit: Payer: Self-pay | Admitting: Family Medicine

## 2019-04-30 ENCOUNTER — Other Ambulatory Visit: Payer: Self-pay

## 2019-04-30 ENCOUNTER — Ambulatory Visit (INDEPENDENT_AMBULATORY_CARE_PROVIDER_SITE_OTHER): Payer: Medicare Other | Admitting: *Deleted

## 2019-04-30 VITALS — Ht 72.0 in | Wt 237.0 lb

## 2019-04-30 DIAGNOSIS — Z0000 Encounter for general adult medical examination without abnormal findings: Secondary | ICD-10-CM

## 2019-04-30 NOTE — Progress Notes (Addendum)
MEDICARE ANNUAL WELLNESS VISIT  04/30/2019  Telephone Visit Disclaimer This Medicare AWV was conducted by telephone due to national recommendations for restrictions regarding the COVID-19 Pandemic (e.g. social distancing).  I verified, using two identifiers, that I am speaking with Mitchell Parker. or their authorized healthcare agent. I discussed the limitations, risks, security, and privacy concerns of performing an evaluation and management service by telephone and the potential availability of an in-person appointment in the future. The patient expressed understanding and agreed to proceed.   Subjective:  Mitchell Parker. is a 68 y.o. male patient of Stacks, Broadus John, MD who had a Medicare Annual Wellness Visit today via telephone. Mitchell Parker is Retired and lives with their spouse. he has 2 children. he reports that he is socially active and does interact with friends/family regularly. he is moderately physically active and enjoys working in garage.  Patient Care Team: [name], MD as PCP - General (Family Medicine) Jonelle Sidle, MD as PCP - Cardiology (Cardiology) West Bali, MD as Consulting Physician (Gastroenterology)  Advanced Directives 04/30/2019 09/07/2018 09/07/2018 10/08/2012 05/23/2012 04/18/2012 01/16/2012  Does Patient Have a Medical Advance Directive? Yes No No Patient has advance directive, copy in chart Patient has advance directive, copy not in chart Patient has advance directive, copy not in chart Patient has advance directive, copy not in chart  Type of Advance Directive Healthcare Power of [place];Living will - - Living will Living will Healthcare Power of [place];Living will Healthcare Power of [place];Living will  Copy of Healthcare Power of Attorney in Chart? No - copy requested - - - - Copy requested from family Copy requested from family  Would patient like information on creating a medical advance directive? - No - Patient declined - - - - -   Pre-existing out of facility DNR order (yellow form or pink MOST form) - - - - No No -    Hospital Utilization Over the Past 12 Months: # of hospitalizations or ER visits: 1 # of surgeries: 0  Review of Systems    Patient reports that his overall health is better compared to last year.  Patient Reported Readings (BP, Pulse, CBG, Weight, etc) Weight 236lbs  Review of Systems: History obtained from chart review and the patient General ROS: negative  All other systems negative.  Pain Assessment Pain Score: 4  Pain Type: Chronic pain Pain Location: Hip Pain Orientation: Left Pain Descriptors / Indicators: Aching, Constant Pain Onset: More than a month ago Pain Frequency: Constant Pain Relieving Factors: Tylenol  Pain Relieving Factors: Tylenol  Current Medications & Allergies (verified) Allergies as of 04/30/2019      Reactions   Ciprofloxacin Hives   Definity [perflutren Lipid Microsphere] Nausea And Vomiting   During ECHO with Definity injection pt became nauseated and vomited    Levofloxacin [levofloxacin] Hives      Medication List       Accurate as of Apr 30, 2019  2:48 PM. Always use your most recent med list.        albuterol-ipratropium 18-103 MCG/ACT inhaler Commonly known as:  COMBIVENT Inhale 2 puffs into the lungs every 6 (six) hours as needed. Wheezing   allopurinol 300 MG tablet Commonly known as:  ZYLOPRIM Take 1 tablet (300 mg total) by mouth every morning.   atorvastatin 80 MG tablet Commonly known as:  LIPITOR Take 1 tablet (80 mg total) by mouth daily at 6 PM.   calcitRIOL 0.25 MCG capsule Commonly known as:  ROCALTROL TAKE 1 CAPSULE BY MOUTH  DAILY WITH BREAKFAST   calcium carbonate 1500 (600 Ca) MG Tabs tablet Commonly known as:  OSCAL Take 600 mg of elemental calcium by mouth 2 (two) times daily with a meal.   carvedilol 12.5 MG tablet Commonly known as:  COREG Take 1 tablet (12.5 mg total) by mouth 2 (two) times daily with a meal.  For blood pressure &/or kidney function   celecoxib 200 MG capsule Commonly known as:  CELEBREX Take 1 capsule (200 mg total) by mouth daily. With food   Colchicine 0.6 MG Caps Take 0.6 mg by mouth daily.   DULoxetine 30 MG capsule Commonly known as:  Cymbalta Take 1 capsule (30 mg total) by mouth 2 (two) times daily.   fluticasone 50 MCG/ACT nasal spray Commonly known as:  Flonase Place 2 sprays into both nostrils daily as needed for allergies or rhinitis.   furosemide 40 MG tablet Commonly known as:  LASIX Take 40 mg by mouth daily.   HYDROXYCHLOROQUINE SULFATE PO Take 300 mg by mouth 2 (two) times daily.   mometasone 0.1 % cream Commonly known as:  ELOCON APPLY TWICE DAILY   multivitamin capsule Take 1 capsule by mouth daily.   neomycin-polymyxin-hydrocortisone 3.5-10000-1 OTIC suspension Commonly known as:  CORTISPORIN PLACE 4 DROPS INTO AFFECTED EAR TWICE DAILY FOR 10 DAYS   pantoprazole 40 MG tablet Commonly known as:  PROTONIX Take 1 tablet (40 mg total) by mouth daily.   potassium chloride SA 20 MEQ tablet Commonly known as:  K-DUR Take 1 tablet (20 mEq total) by mouth daily. For potassium replacement/ supplement   sildenafil 20 MG tablet Commonly known as:  REVATIO Take 1 tablet (20 mg total) by mouth daily as needed (2-5 as needed).   tamsulosin 0.4 MG Caps capsule Commonly known as:  FLOMAX Take 1 capsule (0.4 mg total) by mouth at bedtime.   vitamin B-12 500 MCG tablet Commonly known as:  CYANOCOBALAMIN Take 500 mcg by mouth daily.       History (reviewed): Past Medical History:  Diagnosis Date  . Abnormal ECG 04/17/2012  . Cholelithiasis with cholecystitis 01/04/2012  . Chronic anticoagulation 04/10/2017  . Chronic diarrhea 04/17/2012  . CKD (chronic kidney disease) stage 3, GFR 30-59 ml/min (HCC)   . COPD (chronic obstructive pulmonary disease) (HCC)   . Dehydration 04/17/2012  . Dizziness 10/08/2012  . Essential hypertension   .  Gallstones   . Gout   . History of colonic polyps 04/10/2017  . History of DVT (deep vein thrombosis)   . HTN (hypertension), malignant 10/08/2012  . Kidney stones   . Lumbar disc disease   . Neuropathy   . Nonischemic cardiomyopathy (HCC)    Minor coronary atherosclerosis at cardiac catheterization 2003, LVEF initially 20% with normalization  . Obesity   . Orthostatic hypotension 09/09/2018  . RA (rheumatoid arthritis) (HCC)   . Sleep apnea   . Syncope, vasovagal 09/07/2018  . Type 2 diabetes mellitus (HCC)   . UTI (lower urinary tract infection) 05/23/2012   Past Surgical History:  Procedure Laterality Date  . BARIATRIC SURGERY  01/11/2018  . CHOLECYSTECTOMY  01/18/2012   Procedure: LAPAROSCOPIC CHOLECYSTECTOMY WITH INTRAOPERATIVE CHOLANGIOGRAM;  Surgeon: Maisie Fus A. Cornett, MD;  Location: [facility];  Service: General;  Laterality: N/A;  . PILONIDAL CYST EXCISION  1997  . UMBILICAL HERNIA REPAIR    . VARICOSE VEIN SURGERY     left leg   Family History  Problem Relation Age of Onset  .  Hypertension Mother   . Stroke Mother   . Diabetes Father   . Hypertension Father   . Heart disease Father   . Diabetes Brother   . Colon cancer Neg Hx    Social History   Socioeconomic History  . Marital status: Married    Spouse name: Probation officer  . Number of children: 2  . Years of education: Not on file  . Highest education level: Some college, no degree  Occupational History  . Occupation: retired  Engineer, production  . Financial resource strain: Not hard at all  . Food insecurity:    Worry: Never true    Inability: Never true  . Transportation needs:    Medical: No    Non-medical: No  Tobacco Use  . Smoking status: Former Smoker    Types: Cigarettes    Last attempt to quit: 12/26/1994    Years since quitting: 24.3  . Smokeless tobacco: Never Used  Substance and Sexual Activity  . Alcohol use: No  . Drug use: No  . Sexual activity: Not on file  Lifestyle  . Physical activity:     Days per week: 7 days    Minutes per session: 30 min  . Stress: Not at all  Relationships  . Social connections:    Talks on phone: Never    Gets together: Never    Attends religious service: Never    Active member of club or organization: Yes    Attends meetings of clubs or organizations: Never    Relationship status: Married  Other Topics Concern  . Not on file  Social History Narrative   Lives at home with wife, and has two adult children. Still ambulatory, no cane or walker.     Activities of Daily Living In your present state of health, do you have any difficulty performing the following activities: 04/30/2019 09/07/2018  Hearing? Y N  Comment Wears Hearing Aid -  Vision? Y N  Comment Eye surgery 05/07/2019  -  Difficulty concentrating or making decisions? Y N  Walking or climbing stairs? Y N  Dressing or bathing? Y N  Doing errands, shopping? Y N  Preparing Food and eating ? N -  Using the Toilet? N -  In the past six months, have you accidently leaked urine? N -  Do you have problems with loss of bowel control? N -  Managing your Medications? N -  Managing your Finances? N -  Housekeeping or managing your Housekeeping? N -  Some recent data might be hidden    Patient Literacy How often do you need to have someone help you when you read instructions, pamphlets, or other written materials from your doctor or pharmacy?: 1 - Never What is the last grade level you completed in school?: 2 years of college  Exercise Exercise limited by: None identified  Diet Patient reports consuming 3 meals a day and 3 snack(s) a day Patient reports that his primary diet is: Low fat Patient reports that she does have regular access to food.   Depression Screen PHQ 2/9 Scores 04/30/2019 03/06/2019 11/19/2018 10/15/2018 09/27/2018  PHQ - 2 Score 0 0 0 2 4  PHQ- 9 Score - - - 7 11     Fall Risk Fall Risk  04/30/2019 03/06/2019 11/19/2018 10/15/2018 09/27/2018  Falls in the past year? 1 0 1  Yes Yes  Number falls in past yr: 0 - 1 2 or more 2 or more  Injury with Fall? 1 - 1  Yes Yes  Risk Factor Category  - - - High Fall Risk (No Data)  Comment - - - - head injury w/ 2 brain bleeds  Risk for fall due to : - - - History of fall(s) -     Objective:  Mitchell Parker. seemed alert and oriented and he participated appropriately during our telephone visit.  Blood Pressure Weight BMI  BP Readings from Last 3 Encounters:  03/06/19 (!) 167/74  11/19/18 (!) 114/40  11/07/18 (!) 142/80   Wt Readings from Last 3 Encounters:  04/30/19 237 lb (107.5 kg)  03/06/19 246 lb 4 oz (111.7 kg)  11/19/18 258 lb 3.2 oz (117.1 kg)   BMI Readings from Last 1 Encounters:  04/30/19 32.14 kg/m    *Unable to obtain current vital signs, weight, and BMI due to telephone visit type  Hearing/Vision  . Mitchell Parker did not seem to have difficulty with hearing/understanding during the telephone conversation . Reports that he has had a formal eye exam by an eye care professional within the past year . Reports that he has had a formal hearing evaluation within the past year *Unable to fully assess hearing and vision during telephone visit type  Cognitive Function: 6CIT Screen 04/30/2019  What Year? 0 points  What month? 0 points  What time? 0 points  Count back from 20 0 points  Months in reverse 0 points  Repeat phrase 0 points  Total Score 0    Normal Cognitive Function Screening: Yes (Normal:0-7, Significant for Dysfunction: >8)  Immunization & Health Maintenance Record Immunization History  Administered Date(s) Administered  . Influenza Split 10/09/2012  . Influenza, High Dose Seasonal PF 09/27/2018  . Influenza,inj,Quad PF,6+ Mos 09/21/2017  . Influenza-Unspecified 09/27/2018  . Pneumococcal Conjugate-13 06/17/2016, 09/27/2018  . Pneumococcal Polysaccharide-23 05/08/2013  . Tdap 05/08/2013  . Zoster 05/08/2013    Health Maintenance  Topic Date Due  . FOOT EXAM  06/21/1961  .  OPHTHALMOLOGY EXAM  06/21/1961  . URINE MICROALBUMIN  06/21/1963  . COLONOSCOPY  06/21/2003  . HEMOGLOBIN A1C  03/08/2019  . INFLUENZA VACCINE  07/27/2019  . PNA vac Low Risk Adult (2 of 2 - PPSV23) 09/28/2019  . TETANUS/TDAP  05/09/2023  . Hepatitis C Screening  Completed  . HIV Screening  Completed       Assessment  This is a routine wellness examination for Mitchell Parker.[name]  Health Maintenance: Due or Overdue Health Maintenance Due  Topic Date Due  . FOOT EXAM  06/21/1961  . OPHTHALMOLOGY EXAM  06/21/1963  . URINE MICROALBUMIN  06/21/1963  . COLONOSCOPY  06/21/2003  . HEMOGLOBIN A1C  03/08/2019    Mitchell R Hegstrom Parker. does not need a referral for Community Assistance: Care Management:   no Social Work:    no Prescription Assistance:  no Nutrition/Diabetes Education:  no   Plan:  Personalized Goals Goals Addressed            This Visit's Progress   . Weight (lb) < 200 lb (90.7 kg)   237 lb (107.5 kg)     Personalized Health Maintenance & Screening Recommendations  Colorectal cancer screening  Lung Cancer Screening Recommended: no (Low Dose CT Chest recommended if Age 31-80 years, 30 pack-year currently smoking OR have quit w/in past 15 years) Hepatitis C Screening recommended: no HIV Screening recommended: no  Advanced Directives: Written information was not prepared per patient's request.  Referrals & Orders No orders of the defined types were placed in this  encounter.   Follow-up Plan . Follow-up with Mechele ClaudeStacks, [place], MD as planned   I have personally reviewed and noted the following in the patient's chart:   . Medical and social history . Use of alcohol, tobacco or illicit drugs  . Current medications and supplements . Functional ability and status . Nutritional status . Physical activity . Advanced directives . List of other physicians . Hospitalizations, surgeries, and ER visits in previous 12 months . Vitals . Screenings to include  cognitive, depression, and falls . Referrals and appointments  In addition, I have reviewed and discussed with Mitchell RodBilly R Monteforte Parker. certain preventive protocols, quality metrics, and best practice recommendations. A written personalized care plan for preventive services as well as general preventive health recommendations is available and can be mailed to the patient at his request.      Mitchell Huddlehanda Davide Risdon, LPN signature  2/1/30865/04/2019  I have reviewed and agree with the above AWV documentation.   Mary-Margaret Daphine DeutscherMartin, FNP

## 2019-04-30 NOTE — Patient Instructions (Signed)
  Mitchell Parker , Thank you for taking time to come for your Medicare Wellness Visit. I appreciate your ongoing commitment to your health goals. Please review the following plan we discussed and let me know if I can assist you in the future.   These are the goals we discussed: Goals    . Weight (lb) < 200 lb (90.7 kg)       This is a list of the screening recommended for you and due dates:  Health Maintenance  Topic Date Due  . Complete foot exam   06/21/1961  . Eye exam for diabetics  06[birth year]21/1961  . Urine Protein Check  06/21/1963  . Colon Cancer Screening  06/21/2003  . Hemoglobin A1C  03/08/2019  . Flu Shot  07/27/2019  . Pneumonia vaccines (2 of 2 - PPSV23) 09/28/2019  . Tetanus Vaccine  05/09/2023  .  Hepatitis C: One time screening is recommended by Center for Disease Control  (CDC) for  adults born from [birth year] through 1963.   Completed  . HIV Screening  Completed

## 2019-05-03 ENCOUNTER — Other Ambulatory Visit: Payer: Self-pay | Admitting: Family Medicine

## 2019-05-07 DIAGNOSIS — H35371 Puckering of macula, right eye: Secondary | ICD-10-CM | POA: Diagnosis not present

## 2019-05-08 ENCOUNTER — Other Ambulatory Visit: Payer: Self-pay

## 2019-05-08 ENCOUNTER — Ambulatory Visit: Payer: Medicare HMO | Admitting: Cardiology

## 2019-05-09 ENCOUNTER — Encounter: Payer: Self-pay | Admitting: Family Medicine

## 2019-05-09 ENCOUNTER — Ambulatory Visit (INDEPENDENT_AMBULATORY_CARE_PROVIDER_SITE_OTHER): Payer: Medicare Other | Admitting: Family Medicine

## 2019-05-09 VITALS — BP 147/56 | HR 55 | Temp 98.2°F | Ht 72.0 in | Wt 246.0 lb

## 2019-05-09 DIAGNOSIS — N183 Chronic kidney disease, stage 3 unspecified: Secondary | ICD-10-CM

## 2019-05-09 DIAGNOSIS — I10 Essential (primary) hypertension: Secondary | ICD-10-CM

## 2019-05-09 DIAGNOSIS — E1122 Type 2 diabetes mellitus with diabetic chronic kidney disease: Secondary | ICD-10-CM | POA: Diagnosis not present

## 2019-05-09 LAB — BAYER DCA HB A1C WAIVED: HB A1C (BAYER DCA - WAIVED): 5.7 % (ref <7.0)

## 2019-05-09 MED ORDER — CARVEDILOL 25 MG PO TABS: 25.0000 mg | ORAL_TABLET | Freq: Two times a day (BID) | ORAL | 5 refills | Status: DC

## 2019-05-09 NOTE — Progress Notes (Signed)
Subjective:  Patient ID: Mitchell Maya., male    DOB: 14-May-1951  Age: 68 y.o. MRN: [id number]  CC: Hypertension (one month recheck)   HPI Mitchell RELEFORD Sr. presents for  follow-up of hypertension. Patient has no history of headache chest pain or shortness of breath or recent cough. Patient also denies symptoms of TIA such as focal numbness or weakness. Patient denies side effects from medication. States taking it regularly. Follow-up of diabetes. Patient does check blood sugar at home.  His blood sugar remains in the 80s and 90s ever since he lost about 140 pounds a year ago.  He is off of all of his diabetes medicines. Patient denies symptoms such as polyuria, polydipsia, excessive hunger, nausea No significant hypoglycemic spells noted. Medications as noted below. Taking them regularly without complication/adverse reaction being reported today.  History Mitchell Parker has a past medical history of Abnormal ECG (04/17/2012), Cholelithiasis with cholecystitis (01/04/2012), Chronic anticoagulation (04/10/2017), Chronic diarrhea (04/17/2012), CKD (chronic kidney disease) stage 3, GFR 30-59 ml/min (HCC), COPD (chronic obstructive pulmonary disease) ([place]), Dehydration (04/17/2012), Dizziness (10/08/2012), Essential hypertension, Gallstones, Gout, History of colonic polyps (04/10/2017), History of DVT (deep vein thrombosis), HTN (hypertension), malignant (10/08/2012), Kidney stones, Lumbar disc disease, Neuropathy, Nonischemic cardiomyopathy ([place]), Obesity, Orthostatic hypotension (09/09/2018), RA (rheumatoid arthritis) ([place]), Sleep apnea, Syncope, vasovagal (09/07/2018), Type 2 diabetes mellitus ([place]), and UTI (lower urinary tract infection) (05/23/2012).   He has a past surgical history that includes Umbilical hernia repair; Pilonidal cyst excision (1997); Varicose vein surgery; Cholecystectomy (01/18/2012); Bariatric Surgery (01/11/2018); and Eye surgery.   His family history includes Diabetes in his brother and  father; Heart disease in his father; Hypertension in his father and mother; Stroke in his mother.He reports that he quit smoking about 24 years ago. His smoking use included cigarettes. He has never used smokeless tobacco. He reports that he does not drink alcohol or use drugs.  Current Outpatient Medications on File Prior to Visit  Medication Sig Dispense Refill  . albuterol-ipratropium (COMBIVENT) 18-103 MCG/ACT inhaler Inhale 2 puffs into the lungs every 6 (six) hours as needed. Wheezing    . allopurinol (ZYLOPRIM) 300 MG tablet Take 1 tablet (300 mg total) by mouth every morning. 90 tablet 1  . atorvastatin (LIPITOR) 80 MG tablet Take 1 tablet (80 mg total) by mouth daily at 6 PM. 90 tablet 1  . calcitRIOL (ROCALTROL) 0.25 MCG capsule TAKE 1 CAPSULE BY MOUTH  DAILY WITH BREAKFAST 90 capsule 0  . calcium carbonate (OSCAL) 1500 (600 Ca) MG TABS tablet Take 600 mg of elemental calcium by mouth 2 (two) times daily with a meal.    . celecoxib (CELEBREX) 200 MG capsule Take 1 capsule (200 mg total) by mouth daily. With food 90 capsule 1  . Colchicine 0.6 MG CAPS Take 0.6 mg by mouth daily. 90 capsule 1  . DULoxetine (CYMBALTA) 30 MG capsule Take 1 capsule (30 mg total) by mouth 2 (two) times daily. 180 capsule 3  . fluticasone (FLONASE) 50 MCG/ACT nasal spray Place 2 sprays into both nostrils daily as needed for allergies or rhinitis. 48 g 0  . furosemide (LASIX) 40 MG tablet Take 40 mg by mouth daily.    [name] HYDROXYCHLOROQUINE SULFATE PO Take 300 mg by mouth 2 (two) times daily.    . mometasone (ELOCON) 0.1 % cream APPLY TWICE DAILY 45 g 0  . Multiple Vitamin (MULTIVITAMIN) capsule Take 1 capsule by mouth daily.    [name] neomycin-polymyxin-hydrocortisone (CORTISPORIN) 3.5-10000-1 OTIC suspension PLACE  4 DROPS INTO AFFECTED EAR TWICE DAILY FOR 10 DAYS 10 mL 0  . pantoprazole (PROTONIX) 40 MG tablet Take 1 tablet (40 mg total) by mouth daily. 90 tablet 1  . potassium chloride SA (K-DUR,KLOR-CON) 20 MEQ  tablet Take 1 tablet (20 mEq total) by mouth daily. For potassium replacement/ supplement 90 tablet 1  . sildenafil (REVATIO) 20 MG tablet Take 1 tablet (20 mg total) by mouth daily as needed (2-5 as needed). 50 tablet 5  . tamsulosin (FLOMAX) 0.4 MG CAPS capsule Take 1 capsule (0.4 mg total) by mouth at bedtime. 90 capsule 1  . vitamin B-12 (CYANOCOBALAMIN) 500 MCG tablet Take 500 mcg by mouth daily.     No current facility-administered medications on file prior to visit.     ROS Review of Systems  Constitutional: Negative for fever.  Respiratory: Negative for shortness of breath.   Cardiovascular: Negative for chest pain.  Musculoskeletal: Negative for arthralgias.  Skin: Negative for rash.    Objective:  BP (!) 147/56   Pulse (!) 55   Temp 98.2 F (36.8 C) (Oral)   Ht 6' (1.829 m)   Wt 246 lb (111.6 kg)   BMI 33.36 kg/m   BP Readings from Last 3 Encounters:  05/09/19 (!) 147/56  03/06/19 (!) 167/74  11/19/18 (!) 114/40    Wt Readings from Last 3 Encounters:  05/09/19 246 lb (111.6 kg)  04/30/19 237 lb (107.5 kg)  03/06/19 246 lb 4 oz (111.7 kg)     Physical Exam Vitals signs reviewed.  Constitutional:      Appearance: He is well-developed.  HENT:     Head: Normocephalic and atraumatic.     Right Ear: External ear normal.     Left Ear: External ear normal.     Mouth/Throat:     Pharynx: No oropharyngeal exudate or posterior oropharyngeal erythema.  Eyes:     Pupils: Pupils are equal, round, and reactive to light.  Neck:     Musculoskeletal: Normal range of motion and neck supple.  Cardiovascular:     Rate and Rhythm: Normal rate and regular rhythm.     Heart sounds: No murmur.  Pulmonary:     Effort: No respiratory distress.     Breath sounds: Normal breath sounds.  Neurological:     Mental Status: He is alert and oriented to person, place, and time.       Assessment & Plan:   Mitchell Parker was seen today for hypertension.  Diagnoses and all orders for  this visit:  Essential hypertension -     CMP14+EGFR  Type 2 diabetes mellitus with stage 3 chronic kidney disease, without long-term current use of insulin (HCC) -     CMP14+EGFR -     Bayer DCA Hb A1c Waived  Other orders -     carvedilol (COREG) 25 MG tablet; Take 1 tablet (25 mg total) by mouth 2 (two) times daily with a meal. For blood pressure &/or kidney function   Allergies as of 05/09/2019      Reactions   Ciprofloxacin Hives   Definity [perflutren Lipid Microsphere] Nausea And Vomiting   During ECHO with Definity injection pt became nauseated and vomited    Levofloxacin [levofloxacin] Hives      Medication List       Accurate as of May 09, 2019  1:42 PM. If you have any questions, ask your nurse or doctor.        albuterol-ipratropium 18-103 MCG/ACT inhaler Commonly known as:  COMBIVENT Inhale 2 puffs into the lungs every 6 (six) hours as needed. Wheezing   allopurinol 300 MG tablet Commonly known as:  ZYLOPRIM Take 1 tablet (300 mg total) by mouth every morning.   atorvastatin 80 MG tablet Commonly known as:  LIPITOR Take 1 tablet (80 mg total) by mouth daily at 6 PM.   calcitRIOL 0.25 MCG capsule Commonly known as:  ROCALTROL TAKE 1 CAPSULE BY MOUTH  DAILY WITH BREAKFAST   calcium carbonate 1500 (600 Ca) MG Tabs tablet Commonly known as:  OSCAL Take 600 mg of elemental calcium by mouth 2 (two) times daily with a meal.   carvedilol 25 MG tablet Commonly known as:  COREG Take 1 tablet (25 mg total) by mouth 2 (two) times daily with a meal. For blood pressure &/or kidney function What changed:    medication strength  how much to take Changed by:  [name], MD   celecoxib 200 MG capsule Commonly known as:  CELEBREX Take 1 capsule (200 mg total) by mouth daily. With food   Colchicine 0.6 MG Caps Take 0.6 mg by mouth daily.   DULoxetine 30 MG capsule Commonly known as:  Cymbalta Take 1 capsule (30 mg total) by mouth 2 (two) times daily.    fluticasone 50 MCG/ACT nasal spray Commonly known as:  Flonase Place 2 sprays into both nostrils daily as needed for allergies or rhinitis.   furosemide 40 MG tablet Commonly known as:  LASIX Take 40 mg by mouth daily.   HYDROXYCHLOROQUINE SULFATE PO Take 300 mg by mouth 2 (two) times daily.   mometasone 0.1 % cream Commonly known as:  ELOCON APPLY TWICE DAILY   multivitamin capsule Take 1 capsule by mouth daily.   neomycin-polymyxin-hydrocortisone 3.5-10000-1 OTIC suspension Commonly known as:  CORTISPORIN PLACE 4 DROPS INTO AFFECTED EAR TWICE DAILY FOR 10 DAYS   pantoprazole 40 MG tablet Commonly known as:  PROTONIX Take 1 tablet (40 mg total) by mouth daily.   potassium chloride SA 20 MEQ tablet Commonly known as:  K-DUR Take 1 tablet (20 mEq total) by mouth daily. For potassium replacement/ supplement   sildenafil 20 MG tablet Commonly known as:  REVATIO Take 1 tablet (20 mg total) by mouth daily as needed (2-5 as needed).   tamsulosin 0.4 MG Caps capsule Commonly known as:  FLOMAX Take 1 capsule (0.4 mg total) by mouth at bedtime.   vitamin B-12 500 MCG tablet Commonly known as:  CYANOCOBALAMIN Take 500 mcg by mouth daily.       Meds ordered this encounter  Medications  . carvedilol (COREG) 25 MG tablet    Sig: Take 1 tablet (25 mg total) by mouth 2 (two) times daily with a meal. For blood pressure &/or kidney function    Dispense:  60 tablet    Refill:  5      Follow-up: Return in about 1 month (around 06/09/2019).  [name], M.D.

## 2019-05-09 NOTE — Patient Instructions (Signed)
Schedule follow up appointment in one month for  Hypertension.

## 2019-05-10 LAB — CMP14+EGFR
ALT: 48 IU/L — ABNORMAL HIGH (ref 0–44)
AST: 53 IU/L — ABNORMAL HIGH (ref 0–40)
Albumin/Globulin Ratio: 1.7 (ref 1.2–2.2)
Albumin: 3.9 g/dL (ref 3.8–4.8)
Alkaline Phosphatase: 51 IU/L (ref 39–117)
BUN/Creatinine Ratio: 37 — ABNORMAL HIGH (ref 10–24)
BUN: 43 mg/dL — ABNORMAL HIGH (ref 8–27)
Bilirubin Total: 0.4 mg/dL (ref 0.0–1.2)
CO2: 26 mmol/L (ref 20–29)
Calcium: 8.6 mg/dL (ref 8.6–10.2)
Chloride: 104 mmol/L (ref 96–106)
Creatinine, Ser: 1.17 mg/dL (ref 0.76–1.27)
GFR calc Af Amer: 75 mL/min/{1.73_m2} (ref >59)
GFR calc non Af Amer: 65 mL/min/{1.73_m2} (ref >59)
Globulin, Total: 2.3 g/dL (ref 1.5–4.5)
Glucose: 89 mg/dL (ref 65–99)
Potassium: 4.1 mmol/L (ref 3.5–5.2)
Sodium: 143 mmol/L (ref 134–144)
Total Protein: 6.2 g/dL (ref 6.0–8.5)

## 2019-05-11 ENCOUNTER — Other Ambulatory Visit: Payer: Self-pay | Admitting: Family Medicine

## 2019-05-27 ENCOUNTER — Other Ambulatory Visit: Payer: Self-pay | Admitting: *Deleted

## 2019-05-27 MED ORDER — CARVEDILOL 25 MG PO TABS: 25.0000 mg | ORAL_TABLET | Freq: Two times a day (BID) | ORAL | 1 refills | Status: DC

## 2019-05-29 ENCOUNTER — Ambulatory Visit: Payer: Non-veteran care | Admitting: Cardiology

## 2019-05-31 ENCOUNTER — Encounter (HOSPITAL_COMMUNITY): Payer: Self-pay

## 2019-05-31 ENCOUNTER — Ambulatory Visit (HOSPITAL_COMMUNITY): Payer: Self-pay

## 2019-06-04 DIAGNOSIS — E1142 Type 2 diabetes mellitus with diabetic polyneuropathy: Secondary | ICD-10-CM | POA: Diagnosis not present

## 2019-06-04 DIAGNOSIS — L97511 Non-pressure chronic ulcer of other part of right foot limited to breakdown of skin: Secondary | ICD-10-CM | POA: Diagnosis not present

## 2019-06-04 DIAGNOSIS — M79671 Pain in right foot: Secondary | ICD-10-CM | POA: Diagnosis not present

## 2019-06-06 ENCOUNTER — Other Ambulatory Visit: Payer: Self-pay

## 2019-06-06 ENCOUNTER — Ambulatory Visit (HOSPITAL_COMMUNITY)
Admission: RE | Admit: 2019-06-06 | Discharge: 2019-06-06 | Disposition: A | Discharge status: Home / Self Care | Payer: No Typology Code available for payment source | Source: Ambulatory Visit | Attending: Optometry | Admitting: Optometry

## 2019-06-06 ENCOUNTER — Other Ambulatory Visit: Payer: Self-pay | Admitting: *Deleted

## 2019-06-06 DIAGNOSIS — Z0189 Encounter for other specified special examinations: Secondary | ICD-10-CM | POA: Insufficient documentation

## 2019-06-06 MED ORDER — FUROSEMIDE 40 MG PO TABS: ORAL_TABLET | ORAL | 1 refills | Status: DC

## 2019-06-07 ENCOUNTER — Other Ambulatory Visit: Payer: Self-pay

## 2019-06-10 ENCOUNTER — Encounter: Payer: Self-pay | Admitting: Family Medicine

## 2019-06-10 ENCOUNTER — Ambulatory Visit (INDEPENDENT_AMBULATORY_CARE_PROVIDER_SITE_OTHER): Payer: Medicare Other | Admitting: Family Medicine

## 2019-06-10 DIAGNOSIS — I10 Essential (primary) hypertension: Secondary | ICD-10-CM

## 2019-06-10 DIAGNOSIS — M86179 Other acute osteomyelitis, unspecified ankle and foot: Secondary | ICD-10-CM

## 2019-06-10 MED ORDER — LISINOPRIL 10 MG PO TABS: 10.0000 mg | ORAL_TABLET | Freq: Every day | ORAL | 3 refills | Status: DC

## 2019-06-10 NOTE — Progress Notes (Signed)
Subjective:    Patient ID: Mitchell Parker., male    DOB: [birth year], 68 y.o.   MRN: [id number]   HPI: Mitchell Parker. is a 68 y.o. male presenting for follow-up of hypertension. Patient has no history of headache chest pain or shortness of breath or recent cough. Patient also denies symptoms of TIA such as numbness weakness lateralizing. Patient checks  blood pressure at home and has not had any elevated readings recently. Patient denies side effects from his medication. States taking it regularly.   Patient took multiple readings at home.  Several of them are in the upper 140s and in the low 034V systolic.  Most of the diastolics that he reads to me over the phone today are in the 60s and 42V.  1 diastolic was 91.  He had taken it 2-3 times a day for the last 4 days since being asked to monitor.  Patient tells me also he is seeing Dr. Steffanie Parker of podiatry for a bone infection in the foot.  He is pretty good cream on it and taking antibiotic pills and following closely with Dr. Irving Parker who will see him again later this week.   Depression screen [facility] 2/9 05/09/2019 04/30/2019 03/06/2019 11/19/2018 10/15/2018  Decreased Interest 0 0 0 0 2  Down, Depressed, Hopeless 0 0 0 0 0  PHQ - 2 Score 0 0 0 0 2  Altered sleeping - - - - 2  Tired, decreased energy - - - - 2  Change in appetite - - - - 1  Feeling bad or failure about yourself  - - - - 0  Trouble concentrating - - - - 0  Moving slowly or fidgety/restless - - - - 0  Suicidal thoughts - - - - 0  PHQ-9 Score - - - - 7  Difficult doing work/chores - - - - Somewhat difficult     Relevant past medical, surgical, family and social history reviewed and updated as indicated.  Interim medical history since our last visit reviewed. Allergies and medications reviewed and updated.  ROS:  Review of Systems   Social History   Tobacco Use  Smoking Status Former Smoker  . Types: Cigarettes  . Quit date: 12/26/1994  . Years since quitting:  24.4  Smokeless Tobacco Never Used       Objective:     Wt Readings from Last 3 Encounters:  05/09/19 246 lb (111.6 kg)  04/30/19 237 lb (107.5 kg)  03/06/19 246 lb 4 oz (111.7 kg)     Exam deferred. Pt. [place] due to COVID 19. Phone visit performed.   Assessment & Plan:   1. Essential hypertension   2. Other acute osteomyelitis of foot, unspecified laterality ([place])     Meds ordered this encounter  Medications  . lisinopril (ZESTRIL) 10 MG tablet    Sig: Take 1 tablet (10 mg total) by mouth daily.    Dispense:  90 tablet    Refill:  3    No orders of the defined types were placed in this encounter.     Diagnoses and all orders for this visit:  Essential hypertension  Other acute osteomyelitis of foot, unspecified laterality ([place])  Other orders -     lisinopril (ZESTRIL) 10 MG tablet; Take 1 tablet (10 mg total) by mouth daily.    Virtual Visit via telephone Note  I discussed the limitations, risks, security and privacy concerns of performing an evaluation and management service by  telephone and the availability of in person appointments. The patient was identified with two identifiers. Pt.expressed understanding and agreed to proceed. Pt. Is at home. Dr. Darlyn Read is in his office.  Follow Up Instructions:   I discussed the assessment and treatment plan with the patient. The patient was provided an opportunity to ask questions and all were answered. His main concern related to his history of othostasis. He knows to stay hydrated/ Use lasix only when dyspnea accompanies swelling. Elevate feet otherwise. The patient agreed with the plan and demonstrated an understanding of the instructions.   The patient was advised to call back or seek an in-person evaluation if the symptoms worsen or if the condition fails to improve as anticipated.   Total minutes including chart review and phone contact time: 20   Follow up plan: Return in about 1 month (around  07/10/2019).  [name], MD [facility]

## 2019-06-12 ENCOUNTER — Other Ambulatory Visit: Payer: Self-pay | Admitting: *Deleted

## 2019-06-12 MED ORDER — NEOMYCIN-POLYMYXIN-HC 3.5-10000-1 OT SUSP: OTIC | 0 refills | Status: DC

## 2019-06-13 DIAGNOSIS — L97511 Non-pressure chronic ulcer of other part of right foot limited to breakdown of skin: Secondary | ICD-10-CM | POA: Diagnosis not present

## 2019-06-27 DIAGNOSIS — L97511 Non-pressure chronic ulcer of other part of right foot limited to breakdown of skin: Secondary | ICD-10-CM | POA: Diagnosis not present

## 2019-06-28 ENCOUNTER — Other Ambulatory Visit: Payer: Self-pay | Admitting: Family Medicine

## 2019-07-04 ENCOUNTER — Encounter: Payer: Self-pay | Admitting: *Deleted

## 2019-07-11 DIAGNOSIS — L97511 Non-pressure chronic ulcer of other part of right foot limited to breakdown of skin: Secondary | ICD-10-CM | POA: Diagnosis not present

## 2019-07-11 DIAGNOSIS — E1142 Type 2 diabetes mellitus with diabetic polyneuropathy: Secondary | ICD-10-CM | POA: Diagnosis not present

## 2019-07-25 DIAGNOSIS — L97511 Non-pressure chronic ulcer of other part of right foot limited to breakdown of skin: Secondary | ICD-10-CM | POA: Diagnosis not present

## 2019-07-25 DIAGNOSIS — E1142 Type 2 diabetes mellitus with diabetic polyneuropathy: Secondary | ICD-10-CM | POA: Diagnosis not present

## 2019-07-27 ENCOUNTER — Other Ambulatory Visit: Payer: Self-pay | Admitting: Family Medicine

## 2019-07-29 ENCOUNTER — Ambulatory Visit: Payer: Medicare Other | Admitting: Family Medicine

## 2019-07-30 ENCOUNTER — Other Ambulatory Visit: Payer: Self-pay | Admitting: Family Medicine

## 2019-08-01 ENCOUNTER — Other Ambulatory Visit (HOSPITAL_COMMUNITY): Payer: Self-pay | Admitting: Family Medicine

## 2019-08-01 DIAGNOSIS — S81801A Unspecified open wound, right lower leg, initial encounter: Secondary | ICD-10-CM

## 2019-08-02 ENCOUNTER — Ambulatory Visit: Payer: Medicare Other | Admitting: Family Medicine

## 2019-08-03 ENCOUNTER — Other Ambulatory Visit: Payer: Self-pay | Admitting: Family Medicine

## 2019-08-08 ENCOUNTER — Other Ambulatory Visit: Payer: Self-pay

## 2019-08-08 ENCOUNTER — Ambulatory Visit (HOSPITAL_COMMUNITY)
Admission: RE | Admit: 2019-08-08 | Discharge: 2019-08-08 | Disposition: A | Discharge status: Home / Self Care | Payer: No Typology Code available for payment source | Source: Ambulatory Visit | Attending: Family Medicine | Admitting: Family Medicine

## 2019-08-08 DIAGNOSIS — M19071 Primary osteoarthritis, right ankle and foot: Secondary | ICD-10-CM | POA: Diagnosis not present

## 2019-08-08 DIAGNOSIS — S81801A Unspecified open wound, right lower leg, initial encounter: Secondary | ICD-10-CM | POA: Diagnosis not present

## 2019-08-08 DIAGNOSIS — S91104D Unspecified open wound of right lesser toe(s) without damage to nail, subsequent encounter: Secondary | ICD-10-CM | POA: Diagnosis not present

## 2019-08-14 ENCOUNTER — Telehealth: Payer: Self-pay | Admitting: Cardiology

## 2019-08-14 ENCOUNTER — Ambulatory Visit (INDEPENDENT_AMBULATORY_CARE_PROVIDER_SITE_OTHER): Payer: Medicare Other | Admitting: Family Medicine

## 2019-08-14 ENCOUNTER — Encounter: Payer: Self-pay | Admitting: Family Medicine

## 2019-08-14 DIAGNOSIS — I129 Hypertensive chronic kidney disease with stage 1 through stage 4 chronic kidney disease, or unspecified chronic kidney disease: Secondary | ICD-10-CM | POA: Diagnosis not present

## 2019-08-14 DIAGNOSIS — M109 Gout, unspecified: Secondary | ICD-10-CM

## 2019-08-14 DIAGNOSIS — I10 Essential (primary) hypertension: Secondary | ICD-10-CM

## 2019-08-14 DIAGNOSIS — E1122 Type 2 diabetes mellitus with diabetic chronic kidney disease: Secondary | ICD-10-CM

## 2019-08-14 DIAGNOSIS — N183 Chronic kidney disease, stage 3 unspecified: Secondary | ICD-10-CM

## 2019-08-14 DIAGNOSIS — E782 Mixed hyperlipidemia: Secondary | ICD-10-CM | POA: Diagnosis not present

## 2019-08-14 MED ORDER — FLUTICASONE PROPIONATE 50 MCG/ACT NA SUSP: NASAL | 2 refills | Status: DC

## 2019-08-14 MED ORDER — COLCHICINE 0.6 MG PO CAPS: 0.6000 mg | ORAL_CAPSULE | Freq: Every day | ORAL | 1 refills | Status: DC

## 2019-08-14 MED ORDER — ALLOPURINOL 300 MG PO TABS: 300.0000 mg | ORAL_TABLET | Freq: Every morning | ORAL | 1 refills | Status: DC

## 2019-08-14 MED ORDER — PANTOPRAZOLE SODIUM 40 MG PO TBEC: 40.0000 mg | DELAYED_RELEASE_TABLET | Freq: Every day | ORAL | 1 refills | Status: DC

## 2019-08-14 MED ORDER — CALCITRIOL 0.25 MCG PO CAPS: ORAL_CAPSULE | ORAL | 1 refills | Status: DC

## 2019-08-14 MED ORDER — TAMSULOSIN HCL 0.4 MG PO CAPS: 0.4000 mg | ORAL_CAPSULE | Freq: Every day | ORAL | 1 refills | Status: DC

## 2019-08-14 MED ORDER — POTASSIUM CHLORIDE CRYS ER 20 MEQ PO TBCR: EXTENDED_RELEASE_TABLET | ORAL | 1 refills | Status: DC

## 2019-08-14 MED ORDER — FUROSEMIDE 40 MG PO TABS: ORAL_TABLET | ORAL | 1 refills | Status: DC

## 2019-08-14 MED ORDER — ATORVASTATIN CALCIUM 80 MG PO TABS: 80.0000 mg | ORAL_TABLET | Freq: Every day | ORAL | 1 refills | Status: DC

## 2019-08-14 NOTE — Progress Notes (Signed)
Subjective:    Patient ID: Mitchell Maya., male    DOB: [birth year], 68 y.o.   MRN: [id number]   HPI: Mitchell Parker. is a 68 y.o. male presenting for gout in feet. Right foot 2nd toe is curled under Walks on the tip. Has a bone infection and needs amputation. Dr. Irving Shows to do the procedure in one week.  Cymbalta controlling mood well.  presents forFollow-up of diabetes. Patient checks blood sugar at home.   90-100 fasting and not checking postprandial Patient denies symptoms such as polyuria, polydipsia, excessive hunger, nausea No significant hypoglycemic spells noted. Medications reviewed. Pt reports taking them regularly without complication/adverse reaction being reported today.  Checking feet daily. Last eye appt was recent. He had retina surgery.Vitrectomy.   presents for  follow-up of hypertension. Patient has no history of headache chest pain or shortness of breath or recent cough. Patient also denies symptoms of TIA such as focal numbness or weakness. Patient denies side effects from medication. States taking it regularly.   in for follow-up of elevated cholesterol. Doing well without complaints on current medication. Denies side effects of statin including myalgia and arthralgia and nausea. Currently no chest pain, shortness of breath or other cardiovascular related symptoms noted.     Depression screen [facility] 2/9 05/09/2019 04/30/2019 03/06/2019 11/19/2018 10/15/2018  Decreased Interest 0 0 0 0 2  Down, Depressed, Hopeless 0 0 0 0 0  PHQ - 2 Score 0 0 0 0 2  Altered sleeping - - - - 2  Tired, decreased energy - - - - 2  Change in appetite - - - - 1  Feeling bad or failure about yourself  - - - - 0  Trouble concentrating - - - - 0  Moving slowly or fidgety/restless - - - - 0  Suicidal thoughts - - - - 0  PHQ-9 Score - - - - 7  Difficult doing work/chores - - - - Somewhat difficult     Relevant past medical, surgical, family and social history reviewed and updated  as indicated.  Interim medical history since our last visit reviewed. Allergies and medications reviewed and updated.  ROS:  Review of Systems  Constitutional: Negative.   HENT: Negative.   Eyes: Negative for visual disturbance.  Respiratory: Negative for cough and shortness of breath.   Cardiovascular: Negative for chest pain and leg swelling.  Gastrointestinal: Negative for abdominal pain, diarrhea, nausea and vomiting.  Genitourinary: Negative for difficulty urinating.  Musculoskeletal: Negative for arthralgias and myalgias.  Skin: Negative for rash.  Neurological: Negative for headaches.  Psychiatric/Behavioral: Negative for sleep disturbance.     Social History   Tobacco Use  Smoking Status Former Smoker  . Types: Cigarettes  . Quit date: 12/26/1994  . Years since quitting: 24.6  Smokeless Tobacco Never Used       Objective:     Wt Readings from Last 3 Encounters:  05/09/19 246 lb (111.6 kg)  04/30/19 237 lb (107.5 kg)  03/06/19 246 lb 4 oz (111.7 kg)     Exam deferred. Pt. [place] due to COVID 19. Phone visit performed.   Assessment & Plan:   1. Essential hypertension   2. Type 2 diabetes mellitus with stage 3 chronic kidney disease, without long-term current use of insulin ([place])   3. Gout, unspecified cause, unspecified chronicity, unspecified site   4. Mixed hyperlipidemia     No orders of the defined types were placed in this encounter.  No orders of the defined types were placed in this encounter.     Diagnoses and all orders for this visit:  Essential hypertension  Type 2 diabetes mellitus with stage 3 chronic kidney disease, without long-term current use of insulin (HCC)  Gout, unspecified cause, unspecified chronicity, unspecified site  Mixed hyperlipidemia    Virtual Visit via telephone Note  I discussed the limitations, risks, security and privacy concerns of performing an evaluation and management service by telephone and the  availability of in person appointments. The patient was identified with two identifiers. Pt.expressed understanding and agreed to proceed. Pt. Is at home. Dr. Darlyn Read is in his office.  Follow Up Instructions:   I discussed the assessment and treatment plan with the patient. The patient was provided an opportunity to ask questions and all were answered. The patient agreed with the plan and demonstrated an understanding of the instructions.   The patient was advised to call back or seek an in-person evaluation if the symptoms worsen or if the condition fails to improve as anticipated.   Total minutes including chart review and phone contact time: 25   Follow up plan: Return in about 3 months (around 11/14/2019).  [name], MD [place]

## 2019-08-14 NOTE — Addendum Note (Signed)
Addended by: Rolena Infante on: 08/14/2019 02:32 PM   Modules accepted: Orders

## 2019-08-14 NOTE — Telephone Encounter (Signed)
Virtual Visit Pre-Appointment Phone Call  "(Name), I am calling you today to discuss your upcoming appointment. We are currently trying to limit exposure to the virus that causes COVID-19 by seeing patients at home rather than in the office."  "What is the BEST phone number to call the day of the visit?" - 1. Do you have or have access to (through a family member/friend) a smartphone with video capability that we can use for your visit?" a. If yes - list this number in appt notes as cell (if different from BEST phone #) and list the appointment type as a VIDEO visit in appointment notes b. If no - list the appointment type as a PHONE visit in appointment notes  2. Confirm consent - "In the setting of the current Covid19 crisis, you are scheduled for a (phone or video) visit with your provider on (date) at (time).  Just as we do with many in-office visits, in order for you to participate in this visit, we must obtain consent.  If you'd like, I can send this to your mychart (if signed up) or email for you to review.  Otherwise, I can obtain your verbal consent now.  All virtual visits are billed to your insurance company just like a normal visit would be.  By agreeing to a virtual visit, we'd like you to understand that the technology does not allow for your provider to perform an examination, and thus may limit your provider's ability to fully assess your condition. If your provider identifies any concerns that need to be evaluated in person, we will make arrangements to do so.  Finally, though the technology is pretty good, we cannot assure that it will always work on either your or our end, and in the setting of a video visit, we may have to convert it to a phone-only visit.  In either situation, we cannot ensure that we have a secure connection.  Are you willing to proceed?" STAFF: Did the patient verbally acknowledge consent to telehealth visit? Document YES/NO here:  YES   3. Advise patient  to be prepared - "Two hours prior to your appointment, go ahead and check your blood pressure, pulse, oxygen saturation, and your weight (if you have the equipment to check those) and write them all down. When your visit starts, your provider will ask you for this information. If you have an Apple Watch or Kardia device, please plan to have heart rate information ready on the day of your appointment. Please have a pen and paper handy nearby the day of the visit as well."  4. Give patient instructions for MyChart download to smartphone OR Doximity/Doxy.me as below if video visit (depending on what platform provider is using)  5. Inform patient they will receive a phone call 15 minutes prior to their appointment time (may be from unknown caller ID) so they should be prepared to answer    Mitchell Parker. has been deemed a candidate for a follow-up tele-health visit to limit community exposure during the Covid-19 pandemic. I spoke with the patient via phone to ensure availability of phone/video source, confirm preferred email & phone number, and discuss instructions and expectations.  I reminded HOUSTON ZAPIEN Sr. to be prepared with any vital sign and/or heart rhythm information that could potentially be obtained via home monitoring, at the time of his visit. I reminded RUFUS BESKE Sr. to expect a phone call prior to his visit.  Megan Salon 08/14/2019 3:40 PM   INSTRUCTIONS FOR DOWNLOADING THE MYCHART APP TO SMARTPHONE  - The patient must first make sure to have activated MyChart and know their login information - If Apple, go to [employer] and type in MyChart in the search bar and download the app. If Android, ask patient to go to [employer] and type in Rote in the search bar and download the app. The app is free but as with any other app downloads, their phone may require them to verify saved payment information or Apple/Android password.  - The patient will  need to then log into the app with their MyChart username and password, and select [place] as their healthcare provider to link the account. When it is time for your visit, go to the MyChart app, find appointments, and click Begin Video Visit. Be sure to Select Allow for your device to access the Microphone and Camera for your visit. You will then be connected, and your provider will be with you shortly.  **If they have any issues connecting, or need assistance please contact MyChart service desk [phone])**  **If using a computer, in order to ensure the best quality for their visit they will need to use either of the following Internet Browsers: [employer] or Google Chrome**  IF USING DOXIMITY or DOXY.ME - The patient will receive a link just prior to their visit by text.     FULL LENGTH CONSENT FOR TELE-HEALTH VISIT   I hereby voluntarily request, consent and authorize [facility] and its employed or contracted physicians, physician assistants, nurse practitioners or other licensed health care professionals (the Practitioner), to provide me with telemedicine health care services (the Services") as deemed necessary by the treating Practitioner. I acknowledge and consent to receive the Services by the Practitioner via telemedicine. I understand that the telemedicine visit will involve communicating with the Practitioner through live audiovisual communication technology and the disclosure of certain medical information by electronic transmission. I acknowledge that I have been given the opportunity to request an in-person assessment or other available alternative prior to the telemedicine visit and am voluntarily participating in the telemedicine visit.  I understand that I have the right to withhold or withdraw my consent to the use of telemedicine in the course of my care at any time, without affecting my right to future care or treatment, and that the Practitioner or I  may terminate the telemedicine visit at any time. I understand that I have the right to inspect all information obtained and/or recorded in the course of the telemedicine visit and may receive copies of available information for a reasonable fee.  I understand that some of the potential risks of receiving the Services via telemedicine include:   Delay or interruption in medical evaluation due to technological equipment failure or disruption;  Information transmitted may not be sufficient (e.g. poor resolution of images) to allow for appropriate medical decision making by the Practitioner; and/or   In rare instances, security protocols could fail, causing a breach of personal health information.  Furthermore, I acknowledge that it is my responsibility to provide information about my medical history, conditions and care that is complete and accurate to the best of my ability. I acknowledge that Practitioner's advice, recommendations, and/or decision may be based on factors not within their control, such as incomplete or inaccurate data provided by me or distortions of diagnostic images or specimens that may result from electronic transmissions. I understand that the  practice of medicine is not an Visual merchandiser and that Practitioner makes no warranties or guarantees regarding treatment outcomes. I acknowledge that I will receive a copy of this consent concurrently upon execution via email to the email address I last provided but may also request a printed copy by calling the office of [facility].    I understand that my insurance will be billed for this visit.   I have read or had this consent read to me.  I understand the contents of this consent, which adequately explains the benefits and risks of the Services being provided via telemedicine.   I have been provided ample opportunity to ask questions regarding this consent and the Services and have had my questions answered to my satisfaction.  I  give my informed consent for the services to be provided through the use of telemedicine in my medical care  By participating in this telemedicine visit I agree to the above.

## 2019-08-15 DIAGNOSIS — M86179 Other acute osteomyelitis, unspecified ankle and foot: Secondary | ICD-10-CM | POA: Diagnosis not present

## 2019-08-19 ENCOUNTER — Encounter: Payer: Self-pay | Admitting: Cardiology

## 2019-08-19 NOTE — Progress Notes (Signed)
Virtual Visit via Telephone Note   This visit type was conducted due to national recommendations for restrictions regarding the COVID-19 Pandemic (e.g. social distancing) in an effort to limit this patient's exposure and mitigate transmission in our community.  Due to his co-morbid illnesses, this patient is at least at moderate risk for complications without adequate follow up.  This format is felt to be most appropriate for this patient at this time.  The patient did not have access to video technology/had technical difficulties with video requiring transitioning to audio format only (telephone).  All issues noted in this document were discussed and addressed.  No physical exam could be performed with this format.  Please refer to the patient's chart for his  consent to telehealth for [facility].   Date:  08/20/2019   ID:  Mitchell Rua Sr., DOB [birth year], MRN [id number]  Patient Location: Home Provider Location: Office  PCP:  [name], MD  Cardiologist:  Rozann Lesches, MD Electrophysiologist:  None   Evaluation Performed:  Follow-Up Visit  Chief Complaint:   Cardiac follow-up  History of Present Illness:    Mitchell Parker. is a 68 y.o. male last seen in November 2019.  We spoke by phone today.  He does not report any angina symptoms, no dizziness or syncope.  He states that he has a significant amount of energy since he has lost weight.  Remarkably, he has come from 379 pounds down to 238 pounds.  He does have a toe ulcer with osteomyelitis and plans to have surgery with sedation and local block per Dr. Irving Shows at [facility] later this week.  Anticipate overall low risk from a cardiac perspective.  He continues to follow with Dr. Livia Snellen, I reviewed the most recent lab work as outlined below.  We also went over his medications.  Lasix dose has been cut back.  The patient does not have symptoms concerning for COVID-19 infection (fever, chills, cough, or new shortness of  breath).    Past Medical History:  Diagnosis Date  . Cholelithiasis with cholecystitis 01/04/2012  . Chronic diarrhea 04/17/2012  . CKD (chronic kidney disease) stage 3, GFR 30-59 ml/min (HCC)   . COPD (chronic obstructive pulmonary disease) ([place])   . Essential hypertension   . Gallstones   . Gout   . History of colonic polyps 04/10/2017  . History of DVT (deep vein thrombosis)   . Kidney stones   . Lumbar disc disease   . Neuropathy   . Nonischemic cardiomyopathy ([place])    Minor coronary atherosclerosis at cardiac catheterization 2003, LVEF initially 20% with normalization  . Obesity   . Orthostatic hypotension 09/09/2018  . RA (rheumatoid arthritis) ([place])   . Sleep apnea   . Syncope, vasovagal 09/07/2018  . Type 2 diabetes mellitus ([place])    Past Surgical History:  Procedure Laterality Date  . BARIATRIC SURGERY  01/11/2018  . CHOLECYSTECTOMY  01/18/2012   Procedure: LAPAROSCOPIC CHOLECYSTECTOMY WITH INTRAOPERATIVE CHOLANGIOGRAM;  Surgeon: Marcello Moores A. Cornett, MD;  Location: [facility];  Service: General;  Laterality: N/A;  . EYE SURGERY     Film over retina was removed.  [name] PILONIDAL CYST EXCISION  1997  . UMBILICAL HERNIA REPAIR    . VARICOSE VEIN SURGERY     left leg     Current Meds  Medication Sig  . acetaminophen (TYLENOL) 500 MG tablet Take 500 mg by mouth every 6 (six) hours as needed.  [name] albuterol-ipratropium (COMBIVENT) 18-103 MCG/ACT inhaler Inhale  2 puffs into the lungs every 6 (six) hours as needed. Wheezing  . allopurinol (ZYLOPRIM) 300 MG tablet Take 1 tablet (300 mg total) by mouth every morning.  [name] atorvastatin (LIPITOR) 80 MG tablet Take 1 tablet (80 mg total) by mouth daily at 6 PM.  . calcitRIOL (ROCALTROL) 0.25 MCG capsule TAKE 1 CAPSULE BY MOUTH  DAILY WITH BREAKFAST  . calcium carbonate (OSCAL) 1500 (600 Ca) MG TABS tablet Take 600 mg of elemental calcium by mouth 2 (two) times daily with a meal.  . carvedilol (COREG) 25 MG tablet Take 1 tablet (25 mg total) by  mouth 2 (two) times daily with a meal. For blood pressure &/or kidney function  . celecoxib (CELEBREX) 200 MG capsule TAKE 1 CAPSULE BY MOUTH  DAILY WITH FOOD  . cephALEXin (KEFLEX) 500 MG capsule Take 1 capsule by mouth 3 (three) times daily.  . Colchicine 0.6 MG CAPS Take 0.6 mg by mouth daily.  . DULoxetine (CYMBALTA) 30 MG capsule Take 1 capsule (30 mg total) by mouth 2 (two) times daily.  . fluticasone (FLONASE) 50 MCG/ACT nasal spray USE 2 SPRAYS INTO BOTH  NOSTRILS DAILY AS NEEDED  FOR ALLERGIES OR RHINITIS.  . furosemide (LASIX) 40 MG tablet TAKE 1 TABLET BY MOUTH EVERY DAY IN THE MORNING  . HYDROXYCHLOROQUINE SULFATE PO Take 300 mg by mouth 2 (two) times daily.  [name] lisinopril (ZESTRIL) 10 MG tablet Take 1 tablet (10 mg total) by mouth daily.  . mometasone (ELOCON) 0.1 % cream APPLY TWICE DAILY  . Multiple Vitamin (MULTIVITAMIN) capsule Take 1 capsule by mouth daily.  [name] neomycin-polymyxin-hydrocortisone (CORTISPORIN) 3.5-10000-1 OTIC suspension PLACE 4 DROPS INTO AFFECTED EAR TWICE DAILY FOR 10 DAYS  . pantoprazole (PROTONIX) 40 MG tablet Take 1 tablet (40 mg total) by mouth daily.  . potassium chloride SA (K-DUR) 20 MEQ tablet TAKE 1 TABLET BY MOUTH  DAILY FOR POTASSIUM  REPLACEMENT/ SUPPLEMENT  . sildenafil (REVATIO) 20 MG tablet Take 1 tablet (20 mg total) by mouth daily as needed (2-5 as needed).  . tamsulosin (FLOMAX) 0.4 MG CAPS capsule Take 1 capsule (0.4 mg total) by mouth at bedtime.  . vitamin B-12 (CYANOCOBALAMIN) 500 MCG tablet Take 500 mcg by mouth daily.     Allergies:   Ciprofloxacin, Definity [perflutren lipid microsphere], and Levofloxacin [levofloxacin]   Social History   Tobacco Use  . Smoking status: Former Smoker    Types: Cigarettes    Quit date: 12/26/1994    Years since quitting: 24.6  . Smokeless tobacco: Never Used  Substance Use Topics  . Alcohol use: No  . Drug use: No     Family Hx: The patient's family history includes Diabetes in his brother and  father; Heart disease in his father; Hypertension in his father and mother; Stroke in his mother. There is no history of Colon cancer.  ROS:   Please see the history of present illness. All other systems reviewed and are negative.   Prior CV studies:   The following studies were reviewed today:  Echocardiogram 05/07/2018: Study Conclusions  - Left ventricle: The cavity size was at the upper limits of normal. Wall thickness was increased in a pattern of mild LVH. Systolic function was normal. The estimated ejection fraction was in the range of 50% to 55%. Diffuse hypokinesis. Left ventricular diastolic function parameters were normal for the patient&'s age. - Aortic valve: Trileaflet; mildly calcified leaflets. - Mitral valve: Mildly calcified annulus. There was trivial regurgitation. - Right ventricle: The cavity  size was mildly dilated. - Right atrium: Central venous pressure (est): 3 mm Hg. - Atrial septum: No defect or patent foramen ovale was identified. - Tricuspid valve: There was trivial regurgitation. - Pulmonary arteries: PA peak pressure: 20 mm Hg (S). - Pericardium, extracardiac: There was no pericardial effusion.  Holter monitor 05/03/2018: Available strips from 24-hour Holter monitor reviewed. Sinus rhythm is documented throughout. There are rare to occasional PACs and PVCs with some ventricular bigeminy. Brief bursts of atrial tachycardia noted, longest run 10 beats. Heart rate ranged from 41 bpm up to 94 bpm with average heart rate 52 bpm. No significant pauses were noted.   Labs/Other Tests and Data Reviewed:    EKG:  An ECG dated 09/07/2018 was personally reviewed today and demonstrated:  Sinus bradycardia with IVCD.  Recent Labs: 09/10/2018: Magnesium 2.2 03/06/2019: Hemoglobin 12.8; Platelets 177 05/09/2019: ALT 48; BUN 43; Creatinine, Ser 1.17; Potassium 4.1; Sodium 143   Recent Lipid Panel Lab Results  Component Value Date/Time   CHOL 104  03/06/2019 01:38 PM   TRIG 58 03/06/2019 01:38 PM   HDL 44 03/06/2019 01:38 PM   CHOLHDL 2.4 03/06/2019 01:38 PM   CHOLHDL 11.7 10/22/2011 10:30 AM   LDLCALC 48 03/06/2019 01:38 PM    Wt Readings from Last 3 Encounters:  08/20/19 245 lb (111.1 kg)  05/09/19 246 lb (111.6 kg)  04/30/19 237 lb (107.5 kg)     Objective:    Vital Signs:  BP (!) 141/62   Ht 6' (1.829 m)   Wt 245 lb (111.1 kg)   BMI 33.23 kg/m    Patient spoke in full sentences, not short of breath. No audible wheezing or coughing. Speech pattern normal.  ASSESSMENT & PLAN:    1.  History of palpitations with prior documented PACs and PVCs but no sustained arrhythmias.  He does not report any active symptoms at this time and would continue with observation.  2.  History of symptomatic orthostasis, resolved with medication adjustments.  3.  Mixed hyperlipidemia, continues on statin therapy with most recent LDL 48.  4.  Preoperative evaluation prior to toe surgery by [facility] Ulice Brilliant [facility] this Friday.  He anticipates sedation and a local block.  Overall low risk from a cardiac perspective.  COVID-19 Education: The signs and symptoms of COVID-19 were discussed with the patient and how to seek care for testing (follow up with PCP or arrange E-visit).  The importance of social distancing was discussed today.  Time:   Today, I have spent 6 minutes with the patient with telehealth technology discussing the above problems.     Medication Adjustments/Labs and Tests Ordered: Current medicines are reviewed at length with the patient today.  Concerns regarding medicines are outlined above.   Tests Ordered: No orders of the defined types were placed in this encounter.   Medication Changes: No orders of the defined types were placed in this encounter.   Follow Up:  Virtual Visit or In Person 6 months in the [place] office.  Signed, [name], MD  08/20/2019 9:36 AM    [facility]

## 2019-08-20 ENCOUNTER — Encounter: Payer: Self-pay | Admitting: Cardiology

## 2019-08-20 ENCOUNTER — Telehealth (INDEPENDENT_AMBULATORY_CARE_PROVIDER_SITE_OTHER): Payer: Medicare Other | Admitting: Cardiology

## 2019-08-20 ENCOUNTER — Other Ambulatory Visit: Payer: Self-pay

## 2019-08-20 VITALS — BP 141/62 | Ht 72.0 in | Wt 245.0 lb

## 2019-08-20 DIAGNOSIS — I10 Essential (primary) hypertension: Secondary | ICD-10-CM

## 2019-08-20 DIAGNOSIS — Z87898 Personal history of other specified conditions: Secondary | ICD-10-CM | POA: Diagnosis not present

## 2019-08-20 DIAGNOSIS — I951 Orthostatic hypotension: Secondary | ICD-10-CM | POA: Diagnosis not present

## 2019-08-20 DIAGNOSIS — R002 Palpitations: Secondary | ICD-10-CM

## 2019-08-20 DIAGNOSIS — I428 Other cardiomyopathies: Secondary | ICD-10-CM

## 2019-08-20 DIAGNOSIS — Z01810 Encounter for preprocedural cardiovascular examination: Secondary | ICD-10-CM | POA: Diagnosis not present

## 2019-08-20 NOTE — Patient Instructions (Addendum)

## 2019-08-21 DIAGNOSIS — I110 Hypertensive heart disease with heart failure: Secondary | ICD-10-CM | POA: Diagnosis not present

## 2019-08-21 DIAGNOSIS — Z87891 Personal history of nicotine dependence: Secondary | ICD-10-CM | POA: Diagnosis not present

## 2019-08-21 DIAGNOSIS — Z9884 Bariatric surgery status: Secondary | ICD-10-CM | POA: Diagnosis not present

## 2019-08-21 DIAGNOSIS — Z1159 Encounter for screening for other viral diseases: Secondary | ICD-10-CM | POA: Diagnosis not present

## 2019-08-21 DIAGNOSIS — L97518 Non-pressure chronic ulcer of other part of right foot with other specified severity: Secondary | ICD-10-CM | POA: Diagnosis not present

## 2019-08-21 DIAGNOSIS — M868X7 Other osteomyelitis, ankle and foot: Secondary | ICD-10-CM | POA: Diagnosis not present

## 2019-08-21 DIAGNOSIS — G64 Other disorders of peripheral nervous system: Secondary | ICD-10-CM | POA: Diagnosis not present

## 2019-08-21 DIAGNOSIS — Z91041 Radiographic dye allergy status: Secondary | ICD-10-CM | POA: Diagnosis not present

## 2019-08-21 DIAGNOSIS — G4730 Sleep apnea, unspecified: Secondary | ICD-10-CM | POA: Diagnosis not present

## 2019-08-21 DIAGNOSIS — I509 Heart failure, unspecified: Secondary | ICD-10-CM | POA: Diagnosis not present

## 2019-08-21 DIAGNOSIS — J449 Chronic obstructive pulmonary disease, unspecified: Secondary | ICD-10-CM | POA: Diagnosis not present

## 2019-08-21 DIAGNOSIS — Z881 Allergy status to other antibiotic agents status: Secondary | ICD-10-CM | POA: Diagnosis not present

## 2019-08-21 DIAGNOSIS — E1169 Type 2 diabetes mellitus with other specified complication: Secondary | ICD-10-CM | POA: Diagnosis not present

## 2019-08-21 DIAGNOSIS — Z79899 Other long term (current) drug therapy: Secondary | ICD-10-CM | POA: Diagnosis not present

## 2019-08-21 DIAGNOSIS — E11621 Type 2 diabetes mellitus with foot ulcer: Secondary | ICD-10-CM | POA: Diagnosis not present

## 2019-08-23 DIAGNOSIS — G4730 Sleep apnea, unspecified: Secondary | ICD-10-CM | POA: Diagnosis not present

## 2019-08-23 DIAGNOSIS — Z91041 Radiographic dye allergy status: Secondary | ICD-10-CM | POA: Diagnosis not present

## 2019-08-23 DIAGNOSIS — M86179 Other acute osteomyelitis, unspecified ankle and foot: Secondary | ICD-10-CM | POA: Diagnosis not present

## 2019-08-23 DIAGNOSIS — J449 Chronic obstructive pulmonary disease, unspecified: Secondary | ICD-10-CM | POA: Diagnosis not present

## 2019-08-23 DIAGNOSIS — M868X7 Other osteomyelitis, ankle and foot: Secondary | ICD-10-CM | POA: Diagnosis not present

## 2019-08-23 DIAGNOSIS — I10 Essential (primary) hypertension: Secondary | ICD-10-CM | POA: Diagnosis not present

## 2019-08-23 DIAGNOSIS — Z881 Allergy status to other antibiotic agents status: Secondary | ICD-10-CM | POA: Diagnosis not present

## 2019-08-23 DIAGNOSIS — E119 Type 2 diabetes mellitus without complications: Secondary | ICD-10-CM | POA: Diagnosis not present

## 2019-08-23 DIAGNOSIS — E11621 Type 2 diabetes mellitus with foot ulcer: Secondary | ICD-10-CM | POA: Diagnosis not present

## 2019-08-23 DIAGNOSIS — Z9884 Bariatric surgery status: Secondary | ICD-10-CM | POA: Diagnosis not present

## 2019-08-23 DIAGNOSIS — I509 Heart failure, unspecified: Secondary | ICD-10-CM | POA: Diagnosis not present

## 2019-08-23 DIAGNOSIS — L97518 Non-pressure chronic ulcer of other part of right foot with other specified severity: Secondary | ICD-10-CM | POA: Diagnosis not present

## 2019-08-23 DIAGNOSIS — Z87891 Personal history of nicotine dependence: Secondary | ICD-10-CM | POA: Diagnosis not present

## 2019-08-23 DIAGNOSIS — I110 Hypertensive heart disease with heart failure: Secondary | ICD-10-CM | POA: Diagnosis not present

## 2019-08-23 DIAGNOSIS — G64 Other disorders of peripheral nervous system: Secondary | ICD-10-CM | POA: Diagnosis not present

## 2019-08-23 DIAGNOSIS — Z79899 Other long term (current) drug therapy: Secondary | ICD-10-CM | POA: Diagnosis not present

## 2019-08-23 DIAGNOSIS — L97519 Non-pressure chronic ulcer of other part of right foot with unspecified severity: Secondary | ICD-10-CM | POA: Diagnosis not present

## 2019-08-23 DIAGNOSIS — E1169 Type 2 diabetes mellitus with other specified complication: Secondary | ICD-10-CM | POA: Diagnosis not present

## 2019-09-03 ENCOUNTER — Other Ambulatory Visit: Payer: Self-pay | Admitting: *Deleted

## 2019-09-03 MED ORDER — NEOMYCIN-POLYMYXIN-HC 3.5-10000-1 OT SUSP: OTIC | 0 refills | Status: DC

## 2019-09-03 MED ORDER — MOMETASONE FUROATE 0.1 % EX CREA: TOPICAL_CREAM | Freq: Every day | CUTANEOUS | 3 refills | Status: DC

## 2019-10-10 ENCOUNTER — Telehealth: Payer: Self-pay | Admitting: Cardiology

## 2019-10-10 NOTE — Telephone Encounter (Signed)
Thank you.  It may be worth having him drop by the office for an ECG just to make sure that his rhythm is normal.  If he is in sinus bradycardia and otherwise feeling well we may not change anything for now.

## 2019-10-10 NOTE — Telephone Encounter (Signed)
Received call from Harmon Pier with [facility]  [phone]) ext [phone]  Patient has been running a low pulse rate for 5 days now.   10/06/2019 BP 130/62   P  50 10/07/2019 BP 159/70   P  51 10/08/2019 BP 148/72   P  46 10/09/2019 BP 127/65   P  46 10/10/2019 BP 136/52   P  43

## 2019-10-10 NOTE — Telephone Encounter (Signed)
Spoke with pt who denies any symptoms says he has been feeling great and doing daily activities as normal - denies any medication changes - still taking carvedilol 25 mg bid

## 2019-10-10 NOTE — Telephone Encounter (Signed)
Pt will come by tomorrow for EKG

## 2019-10-11 ENCOUNTER — Ambulatory Visit (INDEPENDENT_AMBULATORY_CARE_PROVIDER_SITE_OTHER): Payer: No Typology Code available for payment source | Admitting: *Deleted

## 2019-10-11 ENCOUNTER — Other Ambulatory Visit: Payer: Self-pay

## 2019-10-11 DIAGNOSIS — R001 Bradycardia, unspecified: Secondary | ICD-10-CM

## 2019-10-11 DIAGNOSIS — I509 Heart failure, unspecified: Secondary | ICD-10-CM

## 2019-10-11 MED ORDER — CARVEDILOL 12.5 MG PO TABS: 12.5000 mg | ORAL_TABLET | Freq: Two times a day (BID) | ORAL | 1 refills | Status: DC

## 2019-10-11 NOTE — Progress Notes (Signed)
Pt here for EKG per yesterdays phone note regarding decreased HR - pt still denies any symptoms says he feels great - had bariatric sleeve around a year ago and has lost over 120lbs

## 2019-10-11 NOTE — Addendum Note (Signed)
Addended by: Julian Hy T on: 10/11/2019 09:34 AM   Modules accepted: Orders

## 2019-10-11 NOTE — Progress Notes (Signed)
Pt voiced understanding - agreeable with decrease of Coreg - updated medication list

## 2019-10-11 NOTE — Progress Notes (Signed)
Reviewed ECG which shows sinus bradycardia at 51 bpm with IVCD.  I am glad that he feels well.  Could consider cutting Coreg to 12.5 mg twice daily to see if he feels any different in a positive way, we do not clearly need to push his heart rate down in the low 50s.

## 2019-10-18 ENCOUNTER — Ambulatory Visit: Payer: Medicare Other

## 2019-11-13 ENCOUNTER — Telehealth: Payer: Self-pay | Admitting: Cardiology

## 2019-11-13 MED ORDER — CARVEDILOL 6.25 MG PO TABS: 6.2500 mg | ORAL_TABLET | Freq: Two times a day (BID) | ORAL | 1 refills | Status: DC

## 2019-11-13 NOTE — Telephone Encounter (Signed)
When they report these numbers it would be useful to have blood pressures as well and also any particular symptoms. Go ahead and cut Coreg down to 6.25 mg twice daily.

## 2019-11-13 NOTE — Telephone Encounter (Signed)
Fwd to provider for review.

## 2019-11-13 NOTE — Telephone Encounter (Signed)
Patient notified and verbalized understanding.  New rx sent to [facility] at patient request.  He will keep another log with BP, HR, & symptoms after making the decrease & notify office of readings in about 10-14 days.

## 2019-11-13 NOTE — Telephone Encounter (Signed)
[facility] Nurse - Harmon Pier - called in regards to his HR.  11/14   HR 47 11/15 HR 46  11/16 HR 48 11/17 HR 52 11/18 HR 43 & 46  Patient [phone]

## 2019-11-21 ENCOUNTER — Other Ambulatory Visit: Payer: Self-pay | Admitting: Family Medicine

## 2019-11-25 ENCOUNTER — Other Ambulatory Visit: Payer: Self-pay | Admitting: Family Medicine

## 2019-12-17 ENCOUNTER — Telehealth: Payer: Self-pay | Admitting: Cardiology

## 2019-12-17 DIAGNOSIS — I10 Essential (primary) hypertension: Secondary | ICD-10-CM

## 2019-12-17 MED ORDER — LISINOPRIL 20 MG PO TABS: 20.0000 mg | ORAL_TABLET | Freq: Every day | ORAL | 3 refills | Status: DC

## 2019-12-17 NOTE — Telephone Encounter (Signed)
I spoke with patient.He will increase Lisinopril to 20 mg daily.I mailed lab slip to him.

## 2019-12-17 NOTE — Telephone Encounter (Signed)
BP readings with no symptoms.  She wanted to make office aware and to contact patient with any changes if needed.   Dec 19th    174/67 HR  50 Dec 20th    173/64 HR 46 Dec 21st    173/67 HR  55 Dec 22nd   180/64 HR 49        176/64  HR 49

## 2019-12-17 NOTE — Telephone Encounter (Signed)
Please ask [facility] nurse not to forget to communicate initially with the patient's PCP Dr. Livia Snellen regarding BP trends and management. For now would increase Lisinopril to 20 mg daily and check BMET in 7-10 days.

## 2019-12-18 ENCOUNTER — Other Ambulatory Visit: Payer: Self-pay | Admitting: Family Medicine

## 2019-12-31 ENCOUNTER — Other Ambulatory Visit: Payer: Self-pay | Admitting: *Deleted

## 2019-12-31 ENCOUNTER — Other Ambulatory Visit: Payer: Medicare Other

## 2019-12-31 ENCOUNTER — Other Ambulatory Visit: Payer: Self-pay

## 2019-12-31 DIAGNOSIS — I10 Essential (primary) hypertension: Secondary | ICD-10-CM

## 2020-01-03 ENCOUNTER — Telehealth: Payer: Self-pay | Admitting: *Deleted

## 2020-01-03 NOTE — Telephone Encounter (Signed)
-----   Message from Jonelle Sidle, MD sent at 01/03/2020  7:35 AM EST ----- Results reviewed.  Creatinine and potassium are normal.  Continue with current follow-up plan.

## 2020-01-03 NOTE — Telephone Encounter (Signed)
Patient informed. Copy sent to PCP °

## 2020-01-06 ENCOUNTER — Ambulatory Visit (INDEPENDENT_AMBULATORY_CARE_PROVIDER_SITE_OTHER): Payer: Medicare Other | Admitting: Family Medicine

## 2020-01-06 ENCOUNTER — Encounter: Payer: Self-pay | Admitting: Family Medicine

## 2020-01-06 DIAGNOSIS — I10 Essential (primary) hypertension: Secondary | ICD-10-CM

## 2020-01-06 DIAGNOSIS — J449 Chronic obstructive pulmonary disease, unspecified: Secondary | ICD-10-CM

## 2020-01-06 DIAGNOSIS — N400 Enlarged prostate without lower urinary tract symptoms: Secondary | ICD-10-CM

## 2020-01-06 DIAGNOSIS — M1A00X0 Idiopathic chronic gout, unspecified site, without tophus (tophi): Secondary | ICD-10-CM | POA: Diagnosis not present

## 2020-01-06 DIAGNOSIS — E782 Mixed hyperlipidemia: Secondary | ICD-10-CM | POA: Diagnosis not present

## 2020-01-06 DIAGNOSIS — N529 Male erectile dysfunction, unspecified: Secondary | ICD-10-CM

## 2020-01-06 DIAGNOSIS — K219 Gastro-esophageal reflux disease without esophagitis: Secondary | ICD-10-CM | POA: Diagnosis not present

## 2020-01-06 MED ORDER — TAMSULOSIN HCL 0.4 MG PO CAPS: 0.4000 mg | ORAL_CAPSULE | Freq: Every day | ORAL | 1 refills | Status: DC

## 2020-01-06 MED ORDER — AMLODIPINE BESYLATE 5 MG PO TABS: 5.0000 mg | ORAL_TABLET | Freq: Every day | ORAL | 1 refills | Status: DC

## 2020-01-06 MED ORDER — DULOXETINE HCL 30 MG PO CPEP: 30.0000 mg | ORAL_CAPSULE | Freq: Two times a day (BID) | ORAL | 3 refills | Status: DC

## 2020-01-06 MED ORDER — COLCHICINE 0.6 MG PO CAPS: 0.6000 mg | ORAL_CAPSULE | Freq: Every day | ORAL | 1 refills | Status: DC

## 2020-01-06 MED ORDER — ALLOPURINOL 300 MG PO TABS: 300.0000 mg | ORAL_TABLET | Freq: Every morning | ORAL | 1 refills | Status: DC

## 2020-01-06 MED ORDER — ATORVASTATIN CALCIUM 80 MG PO TABS: 80.0000 mg | ORAL_TABLET | Freq: Every day | ORAL | 1 refills | Status: DC

## 2020-01-06 MED ORDER — SILDENAFIL CITRATE 20 MG PO TABS: 20.0000 mg | ORAL_TABLET | Freq: Every day | ORAL | 5 refills | Status: DC | PRN

## 2020-01-06 MED ORDER — FUROSEMIDE 40 MG PO TABS: ORAL_TABLET | ORAL | 1 refills | Status: DC

## 2020-01-06 NOTE — Progress Notes (Signed)
Subjective:    Patient ID: Mitchell Parker., male    DOB: 08-24-1951, 69 y.o.   MRN: [id number]   HPI: Mitchell Parker. is a 69 y.o. male presenting for  presents for  follow-up of hypertension. Patient has no history of headache chest pain or shortness of breath or recent cough. Patient also denies symptoms of TIA such as focal numbness or weakness. Patient denies side effects from medication. States taking it regularly.   in for follow-up of elevated cholesterol. Doing well without complaints on current medication. Denies side effects of statin including myalgia and arthralgia and nausea. Currently no chest pain, shortness of breath or other cardiovascular related symptoms noted.  Patient in for follow-up of GERD. Currently asymptomatic taking  PPI daily. There is no chest pain or heartburn. No hematemesis and no melena. No dysphagia or choking. Onset is remote. Progression is stable. Complicating factors, none.  Continues with cymbalta for depression. Currently in remission. No change from previous PHQ scores as below.  Depression screen [facility] 2/9 05/09/2019 04/30/2019 03/06/2019 11/19/2018 10/15/2018  Decreased Interest 0 0 0 0 2  Down, Depressed, Hopeless 0 0 0 0 0  PHQ - 2 Score 0 0 0 0 2  Altered sleeping - - - - 2  Tired, decreased energy - - - - 2  Change in appetite - - - - 1  Feeling bad or failure about yourself  - - - - 0  Trouble concentrating - - - - 0  Moving slowly or fidgety/restless - - - - 0  Suicidal thoughts - - - - 0  PHQ-9 Score - - - - 7  Difficult doing work/chores - - - - Somewhat difficult     Relevant past medical, surgical, family and social history reviewed and updated as indicated.  Interim medical history since our last visit reviewed. Allergies and medications reviewed and updated.  ROS:  Review of Systems  Constitutional: Negative.   HENT: Negative.   Eyes: Negative for visual disturbance.  Respiratory: Negative for cough and shortness of  breath.   Cardiovascular: Negative for chest pain and leg swelling.  Gastrointestinal: Negative for abdominal pain, diarrhea, nausea and vomiting.  Genitourinary: Negative for difficulty urinating.  Musculoskeletal: Negative for arthralgias and myalgias.  Skin: Negative for rash.  Neurological: Negative for headaches.  Psychiatric/Behavioral: Negative for sleep disturbance.     Social History   Tobacco Use  Smoking Status Former Smoker  . Types: Cigarettes  . Quit date: 12/26/1994  . Years since quitting: 25.0  Smokeless Tobacco Never Used       Objective:     Wt Readings from Last 3 Encounters:  08/20/19 245 lb (111.1 kg)  05/09/19 246 lb (111.6 kg)  04/30/19 237 lb (107.5 kg)     Exam deferred. Pt. Harboring due to COVID 19. Phone visit performed.   Assessment & Plan:   1. Essential hypertension   2. Chronic obstructive pulmonary disease, unspecified COPD type ([place])   3. Mixed hyperlipidemia   4. Gastroesophageal reflux disease without esophagitis   5. Idiopathic chronic gout without tophus, unspecified site   6. Benign prostatic hyperplasia without lower urinary tract symptoms   7. Vasculogenic erectile dysfunction, unspecified vasculogenic erectile dysfunction type     Meds ordered this encounter  Medications  . amLODipine (NORVASC) 5 MG tablet    Sig: Take 1 tablet (5 mg total) by mouth daily. For blood pressure    Dispense:  90 tablet  Refill:  1  . atorvastatin (LIPITOR) 80 MG tablet    Sig: Take 1 tablet (80 mg total) by mouth daily at 6 PM.    Dispense:  90 tablet    Refill:  1  . allopurinol (ZYLOPRIM) 300 MG tablet    Sig: Take 1 tablet (300 mg total) by mouth every morning.    Dispense:  90 tablet    Refill:  1  . Colchicine 0.6 MG CAPS    Sig: Take 0.6 mg by mouth daily.    Dispense:  90 capsule    Refill:  1  . DULoxetine (CYMBALTA) 30 MG capsule    Sig: Take 1 capsule (30 mg total) by mouth 2 (two) times daily.    Dispense:  180 capsule     Refill:  3  . furosemide (LASIX) 40 MG tablet    Sig: TAKE 1 TABLET BY MOUTH EVERY DAY IN THE MORNING    Dispense:  90 tablet    Refill:  1  . tamsulosin (FLOMAX) 0.4 MG CAPS capsule    Sig: Take 1 capsule (0.4 mg total) by mouth at bedtime.    Dispense:  90 capsule    Refill:  1    Requesting 1 year supply  . sildenafil (REVATIO) 20 MG tablet    Sig: Take 1 tablet (20 mg total) by mouth daily as needed (2-5 as needed).    Dispense:  50 tablet    Refill:  5    No orders of the defined types were placed in this encounter.     Diagnoses and all orders for this visit:  Essential hypertension -     amLODipine (NORVASC) 5 MG tablet; Take 1 tablet (5 mg total) by mouth daily. For blood pressure  Chronic obstructive pulmonary disease, unspecified COPD type (HCC)  Mixed hyperlipidemia -     atorvastatin (LIPITOR) 80 MG tablet; Take 1 tablet (80 mg total) by mouth daily at 6 PM.  Gastroesophageal reflux disease without esophagitis  Idiopathic chronic gout without tophus, unspecified site -     allopurinol (ZYLOPRIM) 300 MG tablet; Take 1 tablet (300 mg total) by mouth every morning. -     Colchicine 0.6 MG CAPS; Take 0.6 mg by mouth daily.  Benign prostatic hyperplasia without lower urinary tract symptoms -     tamsulosin (FLOMAX) 0.4 MG CAPS capsule; Take 1 capsule (0.4 mg total) by mouth at bedtime.  Vasculogenic erectile dysfunction, unspecified vasculogenic erectile dysfunction type -     sildenafil (REVATIO) 20 MG tablet; Take 1 tablet (20 mg total) by mouth daily as needed (2-5 as needed).  Other orders -     DULoxetine (CYMBALTA) 30 MG capsule; Take 1 capsule (30 mg total) by mouth 2 (two) times daily. -     furosemide (LASIX) 40 MG tablet; TAKE 1 TABLET BY MOUTH EVERY DAY IN THE MORNING    Virtual Visit via telephone Note  I discussed the limitations, risks, security and privacy concerns of performing an evaluation and management service by telephone and the  availability of in person appointments. The patient was identified with two identifiers. Pt.expressed understanding and agreed to proceed. Pt. Is at home. Dr. Darlyn Read is in his office.  Follow Up Instructions:   I discussed the assessment and treatment plan with the patient. The patient was provided an opportunity to ask questions and all were answered. The patient agreed with the plan and demonstrated an understanding of the instructions.   The patient was advised  to call back or seek an in-person evaluation if the symptoms worsen or if the condition fails to improve as anticipated.   Total minutes including chart review and phone contact time: 26   Follow up plan: No follow-ups on file.  [name], MD [facility]

## 2020-01-13 ENCOUNTER — Telehealth: Payer: Self-pay | Admitting: *Deleted

## 2020-01-13 NOTE — Telephone Encounter (Signed)
Prior Auth for Sildenafil Citrate 20mg  tab-In Process  Key: -   PA Case ID: [id number]   [employer] is reviewing your PA request. Typically an electronic response will be received within 72 hours. To check for an update later, open this request from your dashboard.  You may close this dialog and return to your dashboard to perform other tasks.

## 2020-01-14 NOTE — Telephone Encounter (Signed)
He'll have to pay cash. Usually reasonable at the [facility], or can mail order with [facility] in [place]

## 2020-01-14 NOTE — Telephone Encounter (Signed)
Prior Auth for Sildenafil Citrate 20mg  tab-DENIED

## 2020-01-15 NOTE — Telephone Encounter (Signed)
Pt aware of provider feedback and voiced understanding. 

## 2020-02-12 ENCOUNTER — Encounter: Payer: Self-pay | Admitting: Family Medicine

## 2020-02-12 ENCOUNTER — Ambulatory Visit (INDEPENDENT_AMBULATORY_CARE_PROVIDER_SITE_OTHER): Payer: Medicare Other | Admitting: Family Medicine

## 2020-02-12 DIAGNOSIS — J449 Chronic obstructive pulmonary disease, unspecified: Secondary | ICD-10-CM

## 2020-02-12 DIAGNOSIS — I10 Essential (primary) hypertension: Secondary | ICD-10-CM

## 2020-02-12 DIAGNOSIS — I509 Heart failure, unspecified: Secondary | ICD-10-CM | POA: Diagnosis not present

## 2020-02-12 NOTE — Progress Notes (Addendum)
Subjective:  Patient ID: Mitchell Parker., male    DOB: 1951-03-06  Age: 69 y.o. MRN: [id number]  CC: No chief complaint on file.   HPI BRITTANY AMIRAULT Sr. presents for  follow-up of hypertension. Patient has no history of headache chest pain or shortness of breath or recent cough. Patient also denies symptoms of TIA such as focal numbness or weakness. Patient denies side effects from medication. States taking it regularly.Admits he got complacent with diet. Has a diet plan he intends to restart. States he is not swelling.   Vitals for the last 10 days:  BP       HR, Sat, Glu 127/60, 61, 98%  85 127/61, 57, 98%, 79 137/62,,64, 96%,  87 118/57, 52,, 98%, 78 140/60, 53, 97%, 86 133/59, 54, 98& , 78 124/58, 55, 98%, 84 120/58, 58, 98%, 79  126/58, 53, 97%, 80 132/56, 58, 98%, 79   History Carrick has a past medical history of Cholelithiasis with cholecystitis (01/04/2012), Chronic diarrhea (04/17/2012), CKD (chronic kidney disease) stage 3, GFR 30-59 ml/min, COPD (chronic obstructive pulmonary disease) (HCC), Essential hypertension, Gallstones, Gout, History of colonic polyps (04/10/2017), History of DVT (deep vein thrombosis), Kidney stones, Lumbar disc disease, Neuropathy, Nonischemic cardiomyopathy (HCC), Obesity, Orthostatic hypotension (09/09/2018), RA (rheumatoid arthritis) (HCC), Sleep apnea, Syncope, vasovagal (09/07/2018), and Type 2 diabetes mellitus (HCC).   He has a past surgical history that includes Umbilical hernia repair; Pilonidal cyst excision (1997); Varicose vein surgery; Cholecystectomy (01/18/2012); Bariatric Surgery (01/11/2018); and Eye surgery.   His family history includes Diabetes in his brother and father; Heart disease in his father; Hypertension in his father and mother; Stroke in his mother.He reports that he quit smoking about 25 years ago. His smoking use included cigarettes. He has never used smokeless tobacco. He reports that he does not drink alcohol or use  drugs.  Current Outpatient Medications on File Prior to Visit  Medication Sig Dispense Refill  . acetaminophen (TYLENOL) 500 MG tablet Take 500 mg by mouth every 6 (six) hours as needed.    [name] albuterol-ipratropium (COMBIVENT) 18-103 MCG/ACT inhaler Inhale 2 puffs into the lungs every 6 (six) hours as needed. Wheezing    . allopurinol (ZYLOPRIM) 300 MG tablet Take 1 tablet (300 mg total) by mouth every morning. 90 tablet 1  . amLODipine (NORVASC) 5 MG tablet Take 1 tablet (5 mg total) by mouth daily. For blood pressure 90 tablet 1  . atorvastatin (LIPITOR) 80 MG tablet Take 1 tablet (80 mg total) by mouth daily at 6 PM. 90 tablet 1  . calcitRIOL (ROCALTROL) 0.25 MCG capsule TAKE 1 CAPSULE BY MOUTH  DAILY WITH BREAKFAST 90 capsule 1  . calcium carbonate (OSCAL) 1500 (600 Ca) MG TABS tablet Take 600 mg of elemental calcium by mouth 2 (two) times daily with a meal.    . carvedilol (COREG) 6.25 MG tablet Take 1 tablet (6.25 mg total) by mouth 2 (two) times daily with a meal. 180 tablet 1  . celecoxib (CELEBREX) 200 MG capsule TAKE 1 CAPSULE BY MOUTH  DAILY WITH FOOD 90 capsule 3  . Colchicine 0.6 MG CAPS Take 0.6 mg by mouth daily. 90 capsule 1  . DULoxetine (CYMBALTA) 30 MG capsule Take 1 capsule (30 mg total) by mouth 2 (two) times daily. 180 capsule 3  . fluticasone (FLONASE) 50 MCG/ACT nasal spray USE 2 SPRAYS INTO BOTH  NOSTRILS DAILY AS NEEDED  FOR ALLERGIES OR RHINITIS. 48 g 2  . furosemide (LASIX) 40 MG  tablet TAKE 1 TABLET BY MOUTH EVERY DAY IN THE MORNING 90 tablet 1  . HYDROXYCHLOROQUINE SULFATE PO Take 300 mg by mouth 2 (two) times daily.    [name] lisinopril (ZESTRIL) 20 MG tablet Take 1 tablet (20 mg total) by mouth daily. 90 tablet 3  . mometasone (ELOCON) 0.1 % cream Apply topically daily. 45 g 3  . Multiple Vitamin (MULTIVITAMIN) capsule Take 1 capsule by mouth daily.    [name] neomycin-polymyxin-hydrocortisone (CORTISPORIN) 3.5-10000-1 OTIC suspension INSTILL 4 DROPS INTO THE  AFFECTED EARS  TWICE DAILY  FOR 10 DAYS 10 mL 0  . pantoprazole (PROTONIX) 40 MG tablet TAKE 1 TABLET BY MOUTH  DAILY 90 tablet 3  . potassium chloride SA (KLOR-CON) 20 MEQ tablet TAKE 1 TABLET BY MOUTH  DAILY FOR POTASSIUM  REPLACEMENT/ SUPPLEMENT 90 tablet 3  . sildenafil (REVATIO) 20 MG tablet Take 1 tablet (20 mg total) by mouth daily as needed (2-5 as needed). 50 tablet 5  . tamsulosin (FLOMAX) 0.4 MG CAPS capsule Take 1 capsule (0.4 mg total) by mouth at bedtime. 90 capsule 1  . vitamin B-12 (CYANOCOBALAMIN) 500 MCG tablet Take 500 mcg by mouth daily.     No current facility-administered medications on file prior to visit.    ROS Review of Systems  Constitutional: Negative for fever.  Respiratory: Negative for shortness of breath.   Cardiovascular: Negative for chest pain.  Musculoskeletal: Negative for arthralgias.  Skin: Negative for rash.    Objective:  There were no vitals taken for this visit.  BP Readings from Last 3 Encounters:  08/20/19 (!) 141/62  05/09/19 (!) 147/56  03/06/19 (!) 167/74    Wt Readings from Last 3 Encounters:  08/20/19 245 lb (111.1 kg)  05/09/19 246 lb (111.6 kg)  04/30/19 237 lb (107.5 kg)     Physical Exam  Exam deferred. Pt. Harboring due to COVID 19. Phone visit performed.   Assessment & Plan:   Diagnoses and all orders for this visit:  Essential hypertension  Chronic obstructive pulmonary disease, unspecified COPD type (HCC)  Chronic congestive heart failure, unspecified heart failure type (HCC)   Allergies as of 02/12/2020      Reactions   Ciprofloxacin Hives   Definity [perflutren Lipid Microsphere] Nausea And Vomiting   During ECHO with Definity injection pt became nauseated and vomited    Levofloxacin [levofloxacin] Hives      Medication List       Accurate as of February 12, 2020  8:23 AM. If you have any questions, ask your nurse or doctor.        acetaminophen 500 MG tablet Commonly known as: TYLENOL Take 500 mg by mouth  every 6 (six) hours as needed.   albuterol-ipratropium 18-103 MCG/ACT inhaler Commonly known as: COMBIVENT Inhale 2 puffs into the lungs every 6 (six) hours as needed. Wheezing   allopurinol 300 MG tablet Commonly known as: ZYLOPRIM Take 1 tablet (300 mg total) by mouth every morning.   amLODipine 5 MG tablet Commonly known as: NORVASC Take 1 tablet (5 mg total) by mouth daily. For blood pressure   atorvastatin 80 MG tablet Commonly known as: LIPITOR Take 1 tablet (80 mg total) by mouth daily at 6 PM.   calcitRIOL 0.25 MCG capsule Commonly known as: ROCALTROL TAKE 1 CAPSULE BY MOUTH  DAILY WITH BREAKFAST   calcium carbonate 1500 (600 Ca) MG Tabs tablet Commonly known as: OSCAL Take 600 mg of elemental calcium by mouth 2 (two) times daily with a meal.  carvedilol 6.25 MG tablet Commonly known as: COREG Take 1 tablet (6.25 mg total) by mouth 2 (two) times daily with a meal.   celecoxib 200 MG capsule Commonly known as: CELEBREX TAKE 1 CAPSULE BY MOUTH  DAILY WITH FOOD   Colchicine 0.6 MG Caps Take 0.6 mg by mouth daily.   DULoxetine 30 MG capsule Commonly known as: Cymbalta Take 1 capsule (30 mg total) by mouth 2 (two) times daily.   fluticasone 50 MCG/ACT nasal spray Commonly known as: FLONASE USE 2 SPRAYS INTO BOTH  NOSTRILS DAILY AS NEEDED  FOR ALLERGIES OR RHINITIS.   furosemide 40 MG tablet Commonly known as: LASIX TAKE 1 TABLET BY MOUTH EVERY DAY IN THE MORNING   HYDROXYCHLOROQUINE SULFATE PO Take 300 mg by mouth 2 (two) times daily.   lisinopril 20 MG tablet Commonly known as: ZESTRIL Take 1 tablet (20 mg total) by mouth daily.   mometasone 0.1 % cream Commonly known as: ELOCON Apply topically daily.   multivitamin capsule Take 1 capsule by mouth daily.   neomycin-polymyxin-hydrocortisone 3.5-10000-1 OTIC suspension Commonly known as: CORTISPORIN INSTILL 4 DROPS INTO THE  AFFECTED EARS TWICE DAILY  FOR 10 DAYS   pantoprazole 40 MG  tablet Commonly known as: PROTONIX TAKE 1 TABLET BY MOUTH  DAILY   potassium chloride SA 20 MEQ tablet Commonly known as: KLOR-CON TAKE 1 TABLET BY MOUTH  DAILY FOR POTASSIUM  REPLACEMENT/ SUPPLEMENT   sildenafil 20 MG tablet Commonly known as: REVATIO Take 1 tablet (20 mg total) by mouth daily as needed (2-5 as needed).   tamsulosin 0.4 MG Caps capsule Commonly known as: FLOMAX Take 1 capsule (0.4 mg total) by mouth at bedtime.   vitamin B-12 500 MCG tablet Commonly known as: CYANOCOBALAMIN Take 500 mcg by mouth daily.         Follow Up Instructions:     I discussed the assessment and treatment plan with the patient. The patient was provided an opportunity to ask questions and all were answered. The patient agreed with the plan and demonstrated an understanding of the instructions.   The patient was advised to call back or seek an in-person evaluation if the symptoms worsen or if the condition fails to improve as anticipated.  The above assessment and management plan was discussed with the patient. The patient verbalized understanding of and has agreed to the management plan. Patient is aware to call the clinic if symptoms persist or worsen. Patient is aware when to return to the clinic for a follow-up visit. Patient educated on when it is appropriate to go to the emergency department.    I provided minutes of non-face-to-face time during this encounter. Visit ended at 74.    [name], MD   Pt. Reports no symptoms referrable to is chronic conditions listed above. He feels good. Planning to start a diet for weight loss right away. He has no active symptoms sfor heart failure and he is increasing his regular exercise program.   Follow-up: Return in about 6 months (around 08/11/2020).  [name], M.D.

## 2020-02-13 ENCOUNTER — Telehealth: Payer: Self-pay | Admitting: Cardiology

## 2020-02-13 NOTE — Telephone Encounter (Signed)
Virtual Visit Pre-Appointment Phone Call  "(Name), I am calling you today to discuss your upcoming appointment. We are currently trying to limit exposure to the virus that causes COVID-19 by seeing patients at home rather than in the office."  1. "What is the BEST phone number to call the day of the visit?" - include this in appointment notes  2. "Do you have or have access to (through a family member/friend) a smartphone with video capability that we can use for your visit?" a. If yes - list this number in appt notes as "cell" (if different from BEST phone #) and list the appointment type as a VIDEO visit in appointment notes b. If no - list the appointment type as a PHONE visit in appointment notes  3. Confirm consent - "In the setting of the current Covid19 crisis, you are scheduled for a (phone or video) visit with your provider on (date) at (time).  Just as we do with many in-office visits, in order for you to participate in this visit, we must obtain consent.  If you'd like, I can send this to your mychart (if signed up) or email for you to review.  Otherwise, I can obtain your verbal consent now.  All virtual visits are billed to your insurance company just like a normal visit would be.  By agreeing to a virtual visit, we'd like you to understand that the technology does not allow for your provider to perform an examination, and thus may limit your provider's ability to fully assess your condition. If your provider identifies any concerns that need to be evaluated in person, we will make arrangements to do so.  Finally, though the technology is pretty good, we cannot assure that it will always work on either your or our end, and in the setting of a video visit, we may have to convert it to a phone-only visit.  In either situation, we cannot ensure that we have a secure connection.  Are you willing to proceed?" STAFF: Did the patient verbally acknowledge consent to telehealth visit? Document  YES/NO here: yes  4. Advise patient to be prepared - "Two hours prior to your appointment, go ahead and check your blood pressure, pulse, oxygen saturation, and your weight (if you have the equipment to check those) and write them all down. When your visit starts, your provider will ask you for this information. If you have an Apple Watch or Kardia device, please plan to have heart rate information ready on the day of your appointment. Please have a pen and paper handy nearby the day of the visit as well."  5. Give patient instructions for MyChart download to smartphone OR Doximity/Doxy.me as below if video visit (depending on what platform provider is using)  6. Inform patient they will receive a phone call 15 minutes prior to their appointment time (may be from unknown caller ID) so they should be prepared to answer    Mitchell Parker. has been deemed a candidate for a follow-up tele-health visit to limit community exposure during the Covid-19 pandemic. I spoke with the patient via phone to ensure availability of phone/video source, confirm preferred email & phone number, and discuss instructions and expectations.  I reminded Mitchell VOLKMAN Sr. to be prepared with any vital sign and/or heart rhythm information that could potentially be obtained via home monitoring, at the time of his visit. I reminded Mitchell BOEHRINGER Sr. to expect a phone  call prior to his visit.  Mitchell Parker 02/13/2020 12:59 PM   INSTRUCTIONS FOR DOWNLOADING THE MYCHART APP TO SMARTPHONE  - The patient must first make sure to have activated MyChart and know their login information - If Apple, go to [employer] and type in MyChart in the search bar and download the app. If Android, ask patient to go to [employer] and type in [place] in the search bar and download the app. The app is free but as with any other app downloads, their phone may require them to verify saved payment information or  Apple/Android password.  - The patient will need to then log into the app with their MyChart username and password, and select [place] as their healthcare provider to link the account. When it is time for your visit, go to the MyChart app, find appointments, and click Begin Video Visit. Be sure to Select Allow for your device to access the Microphone and Camera for your visit. You will then be connected, and your provider will be with you shortly.  **If they have any issues connecting, or need assistance please contact MyChart service desk [phone])**  **If using a computer, in order to ensure the best quality for their visit they will need to use either of the following Internet Browsers: [employer] or Google Chrome**  IF USING DOXIMITY or DOXY.ME - The patient will receive a link just prior to their visit by text.     FULL LENGTH CONSENT FOR TELE-HEALTH VISIT   I hereby voluntarily request, consent and authorize [facility] and its employed or contracted physicians, physician assistants, nurse practitioners or other licensed health care professionals (the Practitioner), to provide me with telemedicine health care services (the "Services") as deemed necessary by the treating Practitioner. I acknowledge and consent to receive the Services by the Practitioner via telemedicine. I understand that the telemedicine visit will involve communicating with the Practitioner through live audiovisual communication technology and the disclosure of certain medical information by electronic transmission. I acknowledge that I have been given the opportunity to request an in-person assessment or other available alternative prior to the telemedicine visit and am voluntarily participating in the telemedicine visit.  I understand that I have the right to withhold or withdraw my consent to the use of telemedicine in the course of my care at any time, without affecting my right to future care  or treatment, and that the Practitioner or I may terminate the telemedicine visit at any time. I understand that I have the right to inspect all information obtained and/or recorded in the course of the telemedicine visit and may receive copies of available information for a reasonable fee.  I understand that some of the potential risks of receiving the Services via telemedicine include:  [name] Delay or interruption in medical evaluation due to technological equipment failure or disruption; . Information transmitted may not be sufficient (e.g. poor resolution of images) to allow for appropriate medical decision making by the Practitioner; and/or  . In rare instances, security protocols could fail, causing a breach of personal health information.  Furthermore, I acknowledge that it is my responsibility to provide information about my medical history, conditions and care that is complete and accurate to the best of my ability. I acknowledge that Practitioner's advice, recommendations, and/or decision may be based on factors not within their control, such as incomplete or inaccurate data provided by me or distortions of diagnostic images or specimens that may result from  electronic transmissions. I understand that the practice of medicine is not an exact science and that Practitioner makes no warranties or guarantees regarding treatment outcomes. I acknowledge that I will receive a copy of this consent concurrently upon execution via email to the email address I last provided but may also request a printed copy by calling the office of [place].    I understand that my insurance will be billed for this visit.   I have read or had this consent read to me. . I understand the contents of this consent, which adequately explains the benefits and risks of the Services being provided via telemedicine.  . I have been provided ample opportunity to ask questions regarding this consent and the Services and have had  my questions answered to my satisfaction. . I give my informed consent for the services to be provided through the use of telemedicine in my medical care  By participating in this telemedicine visit I agree to the above.

## 2020-02-18 NOTE — Progress Notes (Deleted)
{Choose 1 Note Type (Telehealth Visit or Telephone Visit):[phone]}   Date:  02/18/2020   ID:  Mitchell Briggs., DOB Oct 15, 1951, MRN [id number]  {Patient Location:[phone]::"Home"} {Provider Location:[phone]::"Home"}  PCP:  [name], MD  Cardiologist:  [name], MD Electrophysiologist:  None   Evaluation Performed:  {Choose Visit [phone]::"Follow-Up Visit"}  Chief Complaint:  ***  History of Present Illness:    Mitchell Calderwood. is a 69 y.o. male last assessed via telehealth encounter in August 2020.  The patient {does/does not:200015} have symptoms concerning for COVID-19 infection (fever, chills, cough, or new shortness of breath).    Past Medical History:  Diagnosis Date  . Cholelithiasis with cholecystitis 01/04/2012  . Chronic diarrhea 04/17/2012  . CKD (chronic kidney disease) stage 3, GFR 30-59 ml/min   . COPD (chronic obstructive pulmonary disease) (HCC)   . Essential hypertension   . Gallstones   . Gout   . History of colonic polyps 04/10/2017  . History of DVT (deep vein thrombosis)   . Kidney stones   . Lumbar disc disease   . Neuropathy   . Nonischemic cardiomyopathy (HCC)    Minor coronary atherosclerosis at cardiac catheterization 2003, LVEF initially 20% with normalization  . Obesity   . Orthostatic hypotension 09/09/2018  . RA (rheumatoid arthritis) (HCC)   . Sleep apnea   . Syncope, vasovagal 09/07/2018  . Type 2 diabetes mellitus (HCC)    Past Surgical History:  Procedure Laterality Date  . BARIATRIC SURGERY  01/11/2018  . CHOLECYSTECTOMY  01/18/2012   Procedure: LAPAROSCOPIC CHOLECYSTECTOMY WITH INTRAOPERATIVE CHOLANGIOGRAM;  Surgeon: Maisie Fus A. Cornett, MD;  Location: [facility];  Service: General;  Laterality: N/A;  . EYE SURGERY     Film over retina was removed.  [name] PILONIDAL CYST EXCISION  1997  . UMBILICAL HERNIA REPAIR    . VARICOSE VEIN SURGERY     left leg     No outpatient medications have been marked as taking for  the 02/19/20 encounter (Appointment) with Jonelle Sidle, MD.     Allergies:   Ciprofloxacin, Definity [perflutren lipid microsphere], and Levofloxacin [levofloxacin]   Social History   Tobacco Use  . Smoking status: Former Smoker    Types: Cigarettes    Quit date: 12/26/1994    Years since quitting: 25.1  . Smokeless tobacco: Never Used  Substance Use Topics  . Alcohol use: No  . Drug use: No     Family Hx: The patient's family history includes Diabetes in his brother and father; Heart disease in his father; Hypertension in his father and mother; Stroke in his mother. There is no history of Colon cancer.  ROS:   Please see the history of present illness.    *** All other systems reviewed and are negative.   Prior CV studies:   The following studies were reviewed today:  Echocardiogram 05/07/2018: Study Conclusions  - Left ventricle: The cavity size was at the upper limits of normal. Wall thickness was increased in a pattern of mild LVH. Systolic function was normal. The estimated ejection fraction was in the range of 50% to 55%. Diffuse hypokinesis. Left ventricular diastolic function parameters were normal for the patient&'s age. - Aortic valve: Trileaflet; mildly calcified leaflets. - Mitral valve: Mildly calcified annulus. There was trivial regurgitation. - Right ventricle: The cavity size was mildly dilated. - Right atrium: Central venous pressure (est): 3 mm Hg. - Atrial septum: No defect or patent foramen ovale was identified. - Tricuspid valve: There was trivial  regurgitation. - Pulmonary arteries: PA peak pressure: 20 mm Hg (S). - Pericardium, extracardiac: There was no pericardial effusion.  Holtermonitor 05/03/2018: Available strips from 24-hour Holter monitor reviewed. Sinus rhythm is documented throughout. There are rare to occasional PACs and PVCs with some ventricular bigeminy. Brief bursts of atrial tachycardia noted, longest run 10 beats.  Heart rate ranged from 41 bpm up to 94 bpm with average heart rate 52 bpm. No significant pauses were noted.  Labs/Other Tests and Data Reviewed:    EKG:  An ECG dated 10/11/2019 was personally reviewed today and demonstrated:  Sinus bradycardia with IVCD.  Recent Labs: 03/06/2019: Hemoglobin 12.8; Platelets 177 05/09/2019: ALT 48; BUN 43; Creatinine, Ser 1.17; Potassium 4.1; Sodium 143   Recent Lipid Panel Lab Results  Component Value Date/Time   CHOL 104 03/06/2019 01:38 PM   TRIG 58 03/06/2019 01:38 PM   HDL 44 03/06/2019 01:38 PM   CHOLHDL 2.4 03/06/2019 01:38 PM   CHOLHDL 11.7 10/22/2011 10:30 AM   LDLCALC 48 03/06/2019 01:38 PM    Wt Readings from Last 3 Encounters:  08/20/19 245 lb (111.1 kg)  05/09/19 246 lb (111.6 kg)  04/30/19 237 lb (107.5 kg)     Objective:    Vital Signs:  There were no vitals taken for this visit.   {[facility] Virtual Exam (Optional):[phone]::"VITAL SIGNS:  reviewed"}  ASSESSMENT & PLAN:    1. ***  COVID-19 Education: The signs and symptoms of COVID-19 were discussed with the patient and how to seek care for testing (follow up with PCP or arrange E-visit).  ***The importance of social distancing was discussed today.  Time:   Today, I have spent *** minutes with the patient with telehealth technology discussing the above problems.     Medication Adjustments/Labs and Tests Ordered: Current medicines are reviewed at length with the patient today.  Concerns regarding medicines are outlined above.   Tests Ordered: No orders of the defined types were placed in this encounter.   Medication Changes: No orders of the defined types were placed in this encounter.   Follow Up:  {F/U Format:[id number]} {follow up:15908}  Signed, Rozann Lesches, MD  02/18/2020 12:48 PM    [place]

## 2020-02-19 ENCOUNTER — Telehealth: Payer: Medicare Other | Admitting: Cardiology

## 2020-02-20 ENCOUNTER — Telehealth: Payer: Medicare Other | Admitting: Cardiology

## 2020-03-09 ENCOUNTER — Encounter: Payer: Self-pay | Admitting: Cardiology

## 2020-03-09 ENCOUNTER — Telehealth (INDEPENDENT_AMBULATORY_CARE_PROVIDER_SITE_OTHER): Payer: Medicare Other | Admitting: Cardiology

## 2020-03-09 VITALS — BP 126/56 | HR 52 | Ht 72.0 in | Wt 256.0 lb

## 2020-03-09 DIAGNOSIS — E782 Mixed hyperlipidemia: Secondary | ICD-10-CM | POA: Diagnosis not present

## 2020-03-09 DIAGNOSIS — R001 Bradycardia, unspecified: Secondary | ICD-10-CM | POA: Diagnosis not present

## 2020-03-09 DIAGNOSIS — Z87898 Personal history of other specified conditions: Secondary | ICD-10-CM

## 2020-03-09 DIAGNOSIS — I10 Essential (primary) hypertension: Secondary | ICD-10-CM | POA: Diagnosis not present

## 2020-03-09 NOTE — Patient Instructions (Addendum)

## 2020-03-09 NOTE — Progress Notes (Signed)
Virtual Visit via Telephone Note   This visit type was conducted due to national recommendations for restrictions regarding the COVID-19 Pandemic (e.g. social distancing) in an effort to limit this patient's exposure and mitigate transmission in our community.  Due to his co-morbid illnesses, this patient is at least at moderate risk for complications without adequate follow up.  This format is felt to be most appropriate for this patient at this time.  The patient did not have access to video technology/had technical difficulties with video requiring transitioning to audio format only (telephone).  All issues noted in this document were discussed and addressed.  No physical exam could be performed with this format.  Please refer to the patient's chart for his  consent to telehealth for [facility].   The patient was identified using 2 identifiers.  Date:  03/09/2020   ID:  Mitchell Jarred., DOB 1951/05/06, MRN [id number]  Patient Location: Home Provider Location: Office  PCP:  [name], MD  Cardiologist:  [name], MD Electrophysiologist:  None   Evaluation Performed:  Follow-Up Visit  Chief Complaint:  Cardiac follow-up  History of Present Illness:    Mitchell Rowe. is a 69 y.o. male last assessed via telehealth encounter in August 2020.  We spoke by phone today.  He has been walking a mile a day with his wife, states that he feels good with activity, no exertional chest pain, dizziness, or breathlessness beyond NYHA class II.  Since last visit Coreg was decreased to 6.25 mg twice daily and lisinopril was increased to 20 mg daily.  He was also placed on Norvasc by PCP.  Blood pressure trend is much better.  Heart rate in the 50s, he is tolerating this without obvious symptoms.  Follow-up lab work in January showed BUN 33, creatinine 0.99, potassium 4.3.  The patient does not have symptoms concerning for COVID-19 infection (fever, chills, cough, or new shortness of  breath).  He states that he has had both doses of the vaccine.   Past Medical History:  Diagnosis Date  . Cholelithiasis with cholecystitis 01/04/2012  . Chronic diarrhea 04/17/2012  . CKD (chronic kidney disease) stage 3, GFR 30-59 ml/min   . COPD (chronic obstructive pulmonary disease) (HCC)   . Essential hypertension   . Gallstones   . Gout   . History of colonic polyps 04/10/2017  . History of DVT (deep vein thrombosis)   . Kidney stones   . Lumbar disc disease   . Neuropathy   . Nonischemic cardiomyopathy (HCC)    Minor coronary atherosclerosis at cardiac catheterization 2003, LVEF initially 20% with normalization  . Obesity   . Orthostatic hypotension 09/09/2018  . RA (rheumatoid arthritis) (HCC)   . Sleep apnea   . Syncope, vasovagal 09/07/2018  . Type 2 diabetes mellitus (HCC)    Past Surgical History:  Procedure Laterality Date  . BARIATRIC SURGERY  01/11/2018  . CHOLECYSTECTOMY  01/18/2012   Procedure: LAPAROSCOPIC CHOLECYSTECTOMY WITH INTRAOPERATIVE CHOLANGIOGRAM;  Surgeon: Maisie Fus A. Cornett, MD;  Location: [facility];  Service: General;  Laterality: N/A;  . EYE SURGERY     Film over retina was removed.  [name] PILONIDAL CYST EXCISION  1997  . UMBILICAL HERNIA REPAIR    . VARICOSE VEIN SURGERY     left leg     Current Meds  Medication Sig  . acetaminophen (TYLENOL) 500 MG tablet Take 500 mg by mouth every 6 (six) hours as needed.  [name] albuterol-ipratropium (COMBIVENT) 18-103  MCG/ACT inhaler Inhale 2 puffs into the lungs every 6 (six) hours as needed. Wheezing  . allopurinol (ZYLOPRIM) 300 MG tablet Take 1 tablet (300 mg total) by mouth every morning.  [name] amLODipine (NORVASC) 5 MG tablet Take 1 tablet (5 mg total) by mouth daily. For blood pressure  . atorvastatin (LIPITOR) 80 MG tablet Take 1 tablet (80 mg total) by mouth daily at 6 PM.  . calcitRIOL (ROCALTROL) 0.25 MCG capsule TAKE 1 CAPSULE BY MOUTH  DAILY WITH BREAKFAST  . calcium carbonate (OSCAL) 1500 (600 Ca) MG TABS  tablet Take 600 mg of elemental calcium by mouth 2 (two) times daily with a meal.  . carvedilol (COREG) 6.25 MG tablet Take 1 tablet (6.25 mg total) by mouth 2 (two) times daily with a meal.  . celecoxib (CELEBREX) 200 MG capsule TAKE 1 CAPSULE BY MOUTH  DAILY WITH FOOD  . Colchicine 0.6 MG CAPS Take 0.6 mg by mouth daily.  . DULoxetine (CYMBALTA) 30 MG capsule Take 1 capsule (30 mg total) by mouth 2 (two) times daily.  . fluticasone (FLONASE) 50 MCG/ACT nasal spray USE 2 SPRAYS INTO BOTH  NOSTRILS DAILY AS NEEDED  FOR ALLERGIES OR RHINITIS.  . furosemide (LASIX) 40 MG tablet TAKE 1 TABLET BY MOUTH EVERY DAY IN THE MORNING  . HYDROXYCHLOROQUINE SULFATE PO Take 300 mg by mouth 2 (two) times daily.  [name] lisinopril (ZESTRIL) 20 MG tablet Take 1 tablet (20 mg total) by mouth daily.  . mometasone (ELOCON) 0.1 % cream Apply topically daily.  . Multiple Vitamin (MULTIVITAMIN) capsule Take 1 capsule by mouth daily.  [name] neomycin-polymyxin-hydrocortisone (CORTISPORIN) 3.5-10000-1 OTIC suspension INSTILL 4 DROPS INTO THE  AFFECTED EARS TWICE DAILY  FOR 10 DAYS  . pantoprazole (PROTONIX) 40 MG tablet TAKE 1 TABLET BY MOUTH  DAILY  . potassium chloride SA (KLOR-CON) 20 MEQ tablet TAKE 1 TABLET BY MOUTH  DAILY FOR POTASSIUM  REPLACEMENT/ SUPPLEMENT  . tamsulosin (FLOMAX) 0.4 MG CAPS capsule Take 1 capsule (0.4 mg total) by mouth at bedtime.  . vitamin B-12 (CYANOCOBALAMIN) 500 MCG tablet Take 500 mcg by mouth daily.     Allergies:   Ciprofloxacin, Definity [perflutren lipid microsphere], and Levofloxacin [levofloxacin]   ROS:   Occasional palpitations, no syncope.  Prior CV studies:   The following studies were reviewed today:  Echocardiogram 05/07/2018: Study Conclusions  - Left ventricle: The cavity size was at the upper limits of normal. Wall thickness was increased in a pattern of mild LVH. Systolic function was normal. The estimated ejection fraction was in the range of 50% to 55%. Diffuse  hypokinesis. Left ventricular diastolic function parameters were normal for the patient&'s age. - Aortic valve: Trileaflet; mildly calcified leaflets. - Mitral valve: Mildly calcified annulus. There was trivial regurgitation. - Right ventricle: The cavity size was mildly dilated. - Right atrium: Central venous pressure (est): 3 mm Hg. - Atrial septum: No defect or patent foramen ovale was identified. - Tricuspid valve: There was trivial regurgitation. - Pulmonary arteries: PA peak pressure: 20 mm Hg (S). - Pericardium, extracardiac: There was no pericardial effusion.  Holtermonitor 05/03/2018: Available strips from 24-hour Holter monitor reviewed. Sinus rhythm is documented throughout. There are rare to occasional PACs and PVCs with some ventricular bigeminy. Brief bursts of atrial tachycardia noted, longest run 10 beats. Heart rate ranged from 41 bpm up to 94 bpm with average heart rate 52 bpm. No significant pauses were noted.  Labs/Other Tests and Data Reviewed:    EKG:  An ECG  dated 10/11/2019 was personally reviewed today and demonstrated:  Sinus bradycardia with prolonged PR interval and IVCD.  Recent Labs: 05/09/2019: ALT 48; BUN 43; Creatinine, Ser 1.17; Potassium 4.1; Sodium 143   Recent Lipid Panel Lab Results  Component Value Date/Time   CHOL 104 03/06/2019 01:38 PM   TRIG 58 03/06/2019 01:38 PM   HDL 44 03/06/2019 01:38 PM   CHOLHDL 2.4 03/06/2019 01:38 PM   CHOLHDL 11.7 10/22/2011 10:30 AM   LDLCALC 48 03/06/2019 01:38 PM    Wt Readings from Last 3 Encounters:  03/09/20 256 lb (116.1 kg)  08/20/19 245 lb (111.1 kg)  05/09/19 246 lb (111.6 kg)     Objective:    Vital Signs:  BP (!) 126/56   Pulse (!) 52   Ht 6' (1.829 m)   Wt 256 lb (116.1 kg)   SpO2 97%   BMI 34.72 kg/m    Patient spoke in full sentences, not short of breath. No audible wheezing or coughing. Speech pattern normal.  ASSESSMENT & PLAN:    1.  Essential hypertension, blood  pressure trend is much better.  He is now on Norvasc, lisinopril, and Coreg.  No changes were made today. I recommended that he continue with his regular walking plan.  2.  History of palpitations, no progressive symptoms or syncope.  Continue observation.  He remains on low-dose beta-blocker and tolerating bradycardia without obvious symptoms.  3.  Mixed hyperlipidemia, continues on Lipitor.  Last LDL 48.  Time:   Today, I have spent 6 minutes with the patient with telehealth technology discussing the above problems.     Medication Adjustments/Labs and Tests Ordered: Current medicines are reviewed at length with the patient today.  Concerns regarding medicines are outlined above.   Tests Ordered: No orders of the defined types were placed in this encounter.   Medication Changes: No orders of the defined types were placed in this encounter.   Follow Up:  In Person 6 months in the [place] office.  Signed, [name], MD  03/09/2020 8:47 AM    [facility]

## 2020-04-26 ENCOUNTER — Other Ambulatory Visit: Payer: Self-pay | Admitting: Cardiology

## 2020-04-28 ENCOUNTER — Telehealth: Payer: Self-pay | Admitting: Family Medicine

## 2020-04-28 NOTE — Telephone Encounter (Signed)
Patient returned call.  He woke up this morning and took his medication.  He said he normally waits about 30-40 after waking up before actually getting up and moving around but he was in a hurry this morning and immediately got up.  He said he felt dizzy and passed out.  He reports he has had episodes of orthostatic hypotension in the past but does not know if that is what happened this morning.  Appointment scheduled with night clinic doctor tomorrow morning at  8:20 am.

## 2020-04-28 NOTE — Telephone Encounter (Signed)
No answer, no voicemail.

## 2020-04-29 ENCOUNTER — Other Ambulatory Visit: Payer: Self-pay | Admitting: Family Medicine

## 2020-04-29 ENCOUNTER — Other Ambulatory Visit: Payer: Self-pay | Admitting: *Deleted

## 2020-04-29 ENCOUNTER — Other Ambulatory Visit: Payer: Self-pay

## 2020-04-29 ENCOUNTER — Ambulatory Visit (INDEPENDENT_AMBULATORY_CARE_PROVIDER_SITE_OTHER): Payer: Medicare Other | Admitting: Family Medicine

## 2020-04-29 ENCOUNTER — Encounter: Payer: Self-pay | Admitting: Family Medicine

## 2020-04-29 VITALS — BP 127/58 | HR 58 | Temp 97.9°F | Ht 73.0 in | Wt 277.2 lb

## 2020-04-29 DIAGNOSIS — I951 Orthostatic hypotension: Secondary | ICD-10-CM

## 2020-04-29 DIAGNOSIS — I10 Essential (primary) hypertension: Secondary | ICD-10-CM

## 2020-04-29 MED ORDER — CALCITRIOL 0.25 MCG PO CAPS: ORAL_CAPSULE | ORAL | 0 refills | Status: DC

## 2020-04-29 NOTE — Progress Notes (Signed)
Assessment & Plan:  1. Orthostatic hypotension - Discussed with patient that he needs to discuss his symptoms with his cardiologist as per the cardiologist last note the patient is not having any symptoms.  He knows to go slow and take his position changes slowly and carefully to avoid passing out.  Education provided on orthostatic hypotension.   Follow up plan: Return for with cardiologist ASAP.  Deliah Boston, MSN, APRN, FNP-C [facility]  Subjective:   Patient ID: Mitchell Rabalais., male    DOB: 06-Jun-1951, 69 y.o.   MRN: [id number]  HPI: Mitchell Mozer. is a 69 y.o. male presenting on 04/29/2020 for Loss of Parker (Patient states that in sept 2019 he passed out and hit his head.  He was taken to the ER and they told him he had some brain bleeds but they would clear up on their own.  ER states he has orthostatic HTN.  Patient states he not followed up with Neuro.  Yesterday patient passed and took bp after and it was 138/52.  Cardiology states they think his light headedness is from carvedilol.)  Patient has had multiple episodes of passing out when he stands up.  He reports previously his dosage of Coreg was decreased and it did help some with the dizziness, but he does continue to experience it.  He knows that he has to make position changes slowly and wait for the dizziness to subside before he keeps going, however he was in a hurry yesterday and did not wait which resulted in him passing out. He is followed by Dr. Diona Browner, cardiologist.   ROS: Negative unless specifically indicated above in HPI.   Relevant past medical history reviewed and updated as indicated.   Allergies and medications reviewed and updated.   Current Outpatient Medications:  .  acetaminophen (TYLENOL) 500 MG tablet, Take 500 mg by mouth every 6 (six) hours as needed., Disp: , Rfl:  .  albuterol-ipratropium (COMBIVENT) 18-103 MCG/ACT inhaler, Inhale 2 puffs into the lungs every  6 (six) hours as needed. Wheezing, Disp: , Rfl:  .  allopurinol (ZYLOPRIM) 300 MG tablet, Take 1 tablet (300 mg total) by mouth every morning., Disp: 90 tablet, Rfl: 1 .  atorvastatin (LIPITOR) 80 MG tablet, Take 1 tablet (80 mg total) by mouth daily at 6 PM., Disp: 90 tablet, Rfl: 1 .  calcium carbonate (OSCAL) 1500 (600 Ca) MG TABS tablet, Take 600 mg of elemental calcium by mouth 2 (two) times daily with a meal., Disp: , Rfl:  .  celecoxib (CELEBREX) 200 MG capsule, TAKE 1 CAPSULE BY MOUTH  DAILY WITH FOOD, Disp: 90 capsule, Rfl: 3 .  Colchicine 0.6 MG CAPS, Take 0.6 mg by mouth daily., Disp: 90 capsule, Rfl: 1 .  DULoxetine (CYMBALTA) 30 MG capsule, Take 1 capsule (30 mg total) by mouth 2 (two) times daily., Disp: 180 capsule, Rfl: 3 .  fluticasone (FLONASE) 50 MCG/ACT nasal spray, USE 2 SPRAYS INTO BOTH  NOSTRILS DAILY AS NEEDED  FOR ALLERGIES OR RHINITIS., Disp: 48 g, Rfl: 2 .  furosemide (LASIX) 40 MG tablet, TAKE 1 TABLET BY MOUTH EVERY DAY IN THE MORNING, Disp: 90 tablet, Rfl: 1 .  HYDROXYCHLOROQUINE SULFATE PO, Take 300 mg by mouth 2 (two) times daily., Disp: , Rfl:  .  lisinopril (ZESTRIL) 20 MG tablet, Take 1 tablet (20 mg total) by mouth daily., Disp: 90 tablet, Rfl: 3 .  mometasone (ELOCON) 0.1 % cream, Apply topically daily., Disp: 45  g, Rfl: 3 .  Multiple Vitamin (MULTIVITAMIN) capsule, Take 1 capsule by mouth daily., Disp: , Rfl:  .  neomycin-polymyxin-hydrocortisone (CORTISPORIN) 3.5-10000-1 OTIC suspension, INSTILL 4 DROPS INTO THE  AFFECTED EARS TWICE DAILY  FOR 10 DAYS, Disp: 10 mL, Rfl: 0 .  pantoprazole (PROTONIX) 40 MG tablet, TAKE 1 TABLET BY MOUTH  DAILY, Disp: 90 tablet, Rfl: 3 .  potassium chloride SA (KLOR-CON) 20 MEQ tablet, TAKE 1 TABLET BY MOUTH  DAILY FOR POTASSIUM  REPLACEMENT/ SUPPLEMENT, Disp: 90 tablet, Rfl: 3 .  tamsulosin (FLOMAX) 0.4 MG CAPS capsule, Take 1 capsule (0.4 mg total) by mouth at bedtime., Disp: 90 capsule, Rfl: 1 .  vitamin B-12 (CYANOCOBALAMIN)  500 MCG tablet, Take 500 mcg by mouth daily., Disp: , Rfl:  .  amLODipine (NORVASC) 5 MG tablet, TAKE 1 TABLET BY MOUTH  DAILY FOR BLOOD PRESSURE, Disp: 90 tablet, Rfl: 0 .  calcitRIOL (ROCALTROL) 0.25 MCG capsule, TAKE 1 CAPSULE BY MOUTH  DAILY WITH BREAKFAST, Disp: 90 capsule, Rfl: 0 .  carvedilol (COREG) 3.125 MG tablet, Take 1 tablet (3.125 mg total) by mouth 2 (two) times daily., Disp: 180 tablet, Rfl: 1  Allergies  Allergen Reactions  . Ciprofloxacin Hives  . Definity [Perflutren Lipid Microsphere] Nausea And Vomiting    During ECHO with Definity injection pt became nauseated and vomited   . Levofloxacin [Levofloxacin] Hives    Objective:   BP (!) 127/58 (BP Location: Right Arm, Patient Position: Standing, Cuff Size: Large)   Pulse (!) 58   Temp 97.9 F (36.6 C) (Temporal)   Ht 6\' 1"  (1.854 m)   Wt 277 lb 3.2 oz (125.7 kg)   SpO2 100%   BMI 36.57 kg/m    Orthostatic VS:  Lying: BP 147/62 HR 52 Sitting: BP 129/63 HR 51 Standing: BP 127/58 HR 58  Physical Exam Vitals reviewed.  Constitutional:      General: He is not in acute distress.    Appearance: Normal appearance. He is obese. He is not ill-appearing, toxic-appearing or diaphoretic.  HENT:     Head: Normocephalic and atraumatic.  Eyes:     General: No scleral icterus.       Right eye: No discharge.        Left eye: No discharge.     Conjunctiva/sclera: Conjunctivae normal.  Cardiovascular:     Rate and Rhythm: Normal rate and regular rhythm.     Heart sounds: Normal heart sounds. No murmur. No friction rub. No gallop.   Pulmonary:     Effort: Pulmonary effort is normal. No respiratory distress.     Breath sounds: Normal breath sounds. No stridor. No wheezing, rhonchi or rales.  Musculoskeletal:        General: Normal range of motion.     Cervical back: Normal range of motion.  Skin:    General: Skin is warm and dry.  Neurological:     Mental Status: He is alert and oriented to person, place, and time.  Mental status is at baseline.  Psychiatric:        Mood and Affect: Mood normal.        Behavior: Behavior normal.        Thought Content: Thought content normal.        Judgment: Judgment normal.

## 2020-04-29 NOTE — Patient Instructions (Addendum)
Discuss Midodrine with Dr. Diona Browner as a possible treatment for orthostatic hypotension.    Orthostatic Hypotension Blood pressure is a measurement of how strongly, or weakly, your blood is pressing against the walls of your arteries. Orthostatic hypotension is a sudden drop in blood pressure that happens when you quickly change positions, such as when you get up from sitting or lying down. Arteries are blood vessels that carry blood from your heart throughout your body. When blood pressure is too low, you may not get enough blood to your brain or to the rest of your organs. This can cause weakness, light-headedness, rapid heartbeat, and fainting. This can last for just a few seconds or for up to a few minutes. Orthostatic hypotension is usually not a serious problem. However, if it happens frequently or gets worse, it may be a sign of something more serious. What are the causes? This condition may be caused by:  Sudden changes in posture, such as standing up quickly after you have been sitting or lying down.  Blood loss.  Loss of body fluids (dehydration).  Heart problems.  Hormone (endocrine) problems.  Pregnancy.  Severe infection.  Lack of certain nutrients.  Severe allergic reactions (anaphylaxis).  Certain medicines, such as blood pressure medicine or medicines that make the body lose excess fluids (diuretics). Sometimes, this condition can be caused by not taking medicine as directed, such as taking too much of a certain medicine. What increases the risk? The following factors may make you more likely to develop this condition:  Age. Risk increases as you get older.  Conditions that affect the heart or the central nervous system.  Taking certain medicines, such as blood pressure medicine or diuretics.  Being pregnant. What are the signs or symptoms? Symptoms of this condition may include:  Weakness.  Light-headedness.  Dizziness.  Blurred  vision.  Fatigue.  Rapid heartbeat.  Fainting, in severe cases. How is this diagnosed? This condition is diagnosed based on:  Your medical history.  Your symptoms.  Your blood pressure measurement. Your health care provider will check your blood pressure when you are: ? Lying down. ? Sitting. ? Standing. A blood pressure reading is recorded as two numbers, such as "120 over 80" (or 120/80). The first ("top") number is called the systolic pressure. It is a measure of the pressure in your arteries as your heart beats. The second ("bottom") number is called the diastolic pressure. It is a measure of the pressure in your arteries when your heart relaxes between beats. Blood pressure is measured in a unit called mm Hg. Healthy blood pressure for most adults is 120/80. If your blood pressure is below 90/60, you may be diagnosed with hypotension. Other information or tests that may be used to diagnose orthostatic hypotension include:  Your other vital signs, such as your heart rate and temperature.  Blood tests.  Tilt table test. For this test, you will be safely secured to a table that moves you from a lying position to an upright position. Your heart rhythm and blood pressure will be monitored during the test. How is this treated? This condition may be treated by:  Changing your diet. This may involve eating more salt (sodium) or drinking more water.  Taking medicines to raise your blood pressure.  Changing the dosage of certain medicines you are taking that might be lowering your blood pressure.  Wearing compression stockings. These stockings help to prevent blood clots and reduce swelling in your legs. In some cases, you  may need to go to the hospital for:  Fluid replacement. This means you will receive fluids through an IV.  Blood replacement. This means you will receive donated blood through an IV (transfusion).  Treating an infection or heart problems, if this  applies.  Monitoring. You may need to be monitored while medicines that you are taking wear off. Follow these instructions at home: Eating and drinking   Drink enough fluid to keep your urine pale yellow.  Eat a healthy diet, and follow instructions from your health care provider about eating or drinking restrictions. A healthy diet includes: ? Fresh fruits and vegetables. ? Whole grains. ? Lean meats. ? Low-fat dairy products.  Eat extra salt only as directed. Do not add extra salt to your diet unless your health care provider told you to do that.  Eat frequent, small meals.  Avoid standing up suddenly after eating. Medicines  Take over-the-counter and prescription medicines only as told by your health care provider. ? Follow instructions from your health care provider about changing the dosage of your current medicines, if this applies. ? Do not stop or adjust any of your medicines on your own. General instructions   Wear compression stockings as told by your health care provider.  Get up slowly from lying down or sitting positions. This gives your blood pressure a chance to adjust.  Avoid hot showers and excessive heat as directed by your health care provider.  Return to your normal activities as told by your health care provider. Ask your health care provider what activities are safe for you.  Do not use any products that contain nicotine or tobacco, such as cigarettes, e-cigarettes, and chewing tobacco. If you need help quitting, ask your health care provider.  Keep all follow-up visits as told by your health care provider. This is important. Contact a health care provider if you:  Vomit.  Have diarrhea.  Have a fever for more than 2-3 days.  Feel more thirsty than usual.  Feel weak and tired. Get help right away if you:  Have chest pain.  Have a fast or irregular heartbeat.  Develop numbness in any part of your body.  Cannot move your arms or your  legs.  Have trouble speaking.  Become sweaty or feel light-headed.  Faint.  Feel short of breath.  Have trouble staying awake.  Feel confused. Summary  Orthostatic hypotension is a sudden drop in blood pressure that happens when you quickly change positions.  Orthostatic hypotension is usually not a serious problem.  It is diagnosed by having your blood pressure taken lying down, sitting, and then standing.  It may be treated by changing your diet or adjusting your medicines. This information is not intended to replace advice given to you by your health care provider. Make sure you discuss any questions you have with your health care provider. Document Revised: 06/07/2018 Document Reviewed: 06/07/2018 Elsevier Patient Education  [place].

## 2020-05-05 NOTE — Progress Notes (Signed)
Cardiology Office Note  Date: 05/06/2020   ID: Mitchell Schirmer., DOB November 30, 1951, MRN [id number]  PCP:  [name], MD  Cardiologist:  [name], MD Electrophysiologist:  None   Chief Complaint  Patient presents with  . Cardiac follow-up    History of Present Illness: Mitchell Bergen. is a 69 y.o. male last assessed via telehealth encounter in March.  Referred back to the office by PCP evaluation of reported syncope after standing and suspected orthostasis.  I reviewed the PCP office note from May 5.  He is here today with his wife.  He said that he has been back at work, currently at [employer].  He has been up on his feet a lot.  He states one day while not hydrating regularly he had an episode of syncope that occurred after standing.  He was told that he did have an orthostatic blood pressure change on assessment by PCP, there was some discussion about whether he might need midodrine.  He tells me however that since hydrating regularly he has had no further symptoms.  Orthostatic measurements today were normal, he remained hypertensive with systolics in the 140s to 150s throughout, heart rate steady in the low to mid 50s.  We did go over his medications and I discussed further reducing Coreg to 3.125 mg twice daily.  His relative bradycardia while not necessarily limiting him from an exertional perspective, could have contributed to him passing out if in fact he was hypotensive at the time and could not mount a secondary tachycardia.  I do not think that he needs to start on midodrine.  He has also had venous stasis and leg swelling, aggravated by standing at work.  We discussed use of low-level compression stockings.  Past Medical History:  Diagnosis Date  . Cholelithiasis with cholecystitis 01/04/2012  . Chronic diarrhea 04/17/2012  . CKD (chronic kidney disease) stage 3, GFR 30-59 ml/min   . COPD (chronic obstructive pulmonary disease) (HCC)   .  Essential hypertension   . Gallstones   . Gout   . History of colonic polyps 04/10/2017  . History of DVT (deep vein thrombosis)   . Kidney stones   . Lumbar disc disease   . Neuropathy   . Nonischemic cardiomyopathy (HCC)    Minor coronary atherosclerosis at cardiac catheterization 2003, LVEF initially 20% with normalization  . Obesity   . Orthostatic hypotension 09/09/2018  . RA (rheumatoid arthritis) (HCC)   . Sleep apnea   . Syncope, vasovagal 09/07/2018  . Type 2 diabetes mellitus (HCC)     Past Surgical History:  Procedure Laterality Date  . BARIATRIC SURGERY  01/11/2018  . CHOLECYSTECTOMY  01/18/2012   Procedure: LAPAROSCOPIC CHOLECYSTECTOMY WITH INTRAOPERATIVE CHOLANGIOGRAM;  Surgeon: Maisie Fus A. Cornett, MD;  Location: [facility];  Service: General;  Laterality: N/A;  . EYE SURGERY     Film over retina was removed.  [name] PILONIDAL CYST EXCISION  1997  . UMBILICAL HERNIA REPAIR    . VARICOSE VEIN SURGERY     left leg    Current Outpatient Medications  Medication Sig Dispense Refill  . acetaminophen (TYLENOL) 500 MG tablet Take 500 mg by mouth every 6 (six) hours as needed.    [name] albuterol-ipratropium (COMBIVENT) 18-103 MCG/ACT inhaler Inhale 2 puffs into the lungs every 6 (six) hours as needed. Wheezing    . allopurinol (ZYLOPRIM) 300 MG tablet Take 1 tablet (300 mg total) by mouth every morning. 90 tablet 1  .  amLODipine (NORVASC) 5 MG tablet TAKE 1 TABLET BY MOUTH  DAILY FOR BLOOD PRESSURE 90 tablet 0  . atorvastatin (LIPITOR) 80 MG tablet Take 1 tablet (80 mg total) by mouth daily at 6 PM. 90 tablet 1  . calcitRIOL (ROCALTROL) 0.25 MCG capsule TAKE 1 CAPSULE BY MOUTH  DAILY WITH BREAKFAST 90 capsule 0  . calcium carbonate (OSCAL) 1500 (600 Ca) MG TABS tablet Take 600 mg of elemental calcium by mouth 2 (two) times daily with a meal.    . celecoxib (CELEBREX) 200 MG capsule TAKE 1 CAPSULE BY MOUTH  DAILY WITH FOOD 90 capsule 3  . Colchicine 0.6 MG CAPS Take 0.6 mg by mouth daily.  90 capsule 1  . DULoxetine (CYMBALTA) 30 MG capsule Take 1 capsule (30 mg total) by mouth 2 (two) times daily. 180 capsule 3  . fluticasone (FLONASE) 50 MCG/ACT nasal spray USE 2 SPRAYS INTO BOTH  NOSTRILS DAILY AS NEEDED  FOR ALLERGIES OR RHINITIS. 48 g 2  . furosemide (LASIX) 40 MG tablet TAKE 1 TABLET BY MOUTH EVERY DAY IN THE MORNING 90 tablet 1  . HYDROXYCHLOROQUINE SULFATE PO Take 300 mg by mouth 2 (two) times daily.    [name] lisinopril (ZESTRIL) 20 MG tablet Take 1 tablet (20 mg total) by mouth daily. 90 tablet 3  . mometasone (ELOCON) 0.1 % cream Apply topically daily. 45 g 3  . Multiple Vitamin (MULTIVITAMIN) capsule Take 1 capsule by mouth daily.    [name] neomycin-polymyxin-hydrocortisone (CORTISPORIN) 3.5-10000-1 OTIC suspension INSTILL 4 DROPS INTO THE  AFFECTED EARS TWICE DAILY  FOR 10 DAYS 10 mL 0  . pantoprazole (PROTONIX) 40 MG tablet TAKE 1 TABLET BY MOUTH  DAILY 90 tablet 3  . potassium chloride SA (KLOR-CON) 20 MEQ tablet TAKE 1 TABLET BY MOUTH  DAILY FOR POTASSIUM  REPLACEMENT/ SUPPLEMENT 90 tablet 3  . tamsulosin (FLOMAX) 0.4 MG CAPS capsule Take 1 capsule (0.4 mg total) by mouth at bedtime. 90 capsule 1  . vitamin B-12 (CYANOCOBALAMIN) 500 MCG tablet Take 500 mcg by mouth daily.    . carvedilol (COREG) 3.125 MG tablet Take 1 tablet (3.125 mg total) by mouth 2 (two) times daily. 180 tablet 1   No current facility-administered medications for this visit.   Allergies:  Ciprofloxacin, Definity [perflutren lipid microsphere], and Levofloxacin [levofloxacin]   ROS:  No palpitations or chest pain.  Physical Exam: VS:  BP (!) 147/64 (BP Location: Right Arm, Cuff Size: Large)   Pulse (!) 54   Ht 6' (1.829 m)   Wt 280 lb 6.4 oz (127.2 kg)   SpO2 99%   BMI 38.03 kg/m , BMI Body mass index is 38.03 kg/m.  Wt Readings from Last 3 Encounters:  05/06/20 280 lb 6.4 oz (127.2 kg)  04/29/20 277 lb 3.2 oz (125.7 kg)  03/09/20 256 lb (116.1 kg)    General: Patient appears comfortable at  rest. HEENT: Conjunctiva and lids normal, wearing a mask. Neck: Supple, no elevated JVP or carotid bruits, no thyromegaly. Lungs: Clear to auscultation, nonlabored breathing at rest. Cardiac: Regular rate and rhythm, no S3 or significant systolic murmur, no pericardial rub. Extremities: Lower extremity venous stasis bilaterally with venous insufficiency and edema, right worse than left..  ECG:  An ECG dated 10/11/2019 was personally reviewed today and demonstrated:  Sinus bradycardia with prolonged PR interval and IVCD.  Recent Labwork: 05/09/2019: ALT 48; AST 53; BUN 43; Creatinine, Ser 1.17; Potassium 4.1; Sodium 143     Component Value Date/Time  CHOL 104 03/06/2019 1338   TRIG 58 03/06/2019 1338   HDL 44 03/06/2019 1338   CHOLHDL 2.4 03/06/2019 1338   CHOLHDL 11.7 10/22/2011 1030   VLDL 39 10/22/2011 1030   LDLCALC 48 03/06/2019 1338    Other Studies Reviewed Today:  Echocardiogram 05/07/2018: Study Conclusions  - Left ventricle: The cavity size was at the upper limits of normal. Wall thickness was increased in a pattern of mild LVH. Systolic function was normal. The estimated ejection fraction was in the range of 50% to 55%. Diffuse hypokinesis. Left ventricular diastolic function parameters were normal for the patient&'s age. - Aortic valve: Trileaflet; mildly calcified leaflets. - Mitral valve: Mildly calcified annulus. There was trivial regurgitation. - Right ventricle: The cavity size was mildly dilated. - Right atrium: Central venous pressure (est): 3 mm Hg. - Atrial septum: No defect or patent foramen ovale was identified. - Tricuspid valve: There was trivial regurgitation. - Pulmonary arteries: PA peak pressure: 20 mm Hg (S). - Pericardium, extracardiac: There was no pericardial effusion.  Holtermonitor 05/03/2018: Available strips from 24-hour Holter monitor reviewed. Sinus rhythm is documented throughout. There are rare to occasional PACs and PVCs  with some ventricular bigeminy. Brief bursts of atrial tachycardia noted, longest run 10 beats. Heart rate ranged from 41 bpm up to 94 bpm with average heart rate 52 bpm. No significant pauses were noted.  Assessment and Plan:  1.  Single episode of orthostatic syncope, likely complicated by inadequate intravascular fluid status.  With more regular hydration he has had no further episodes and was not found to be frankly orthostatic today.  Coreg will be cut back to 3.125 mg twice daily as discussed above, but otherwise no change in remaining antihypertensive medications.  2.  Bilateral leg swelling and venous stasis, compression stockings prescribed.  3.  History of palpitations, no recent worsening in symptoms.  Medication Adjustments/Labs and Tests Ordered: Current medicines are reviewed at length with the patient today.  Concerns regarding medicines are outlined above.   Tests Ordered: No orders of the defined types were placed in this encounter.   Medication Changes: Meds ordered this encounter  Medications  . carvedilol (COREG) 3.125 MG tablet    Sig: Take 1 tablet (3.125 mg total) by mouth 2 (two) times daily.    Dispense:  180 tablet    Refill:  1    DOSE CHANGE 05/06/2020    Disposition:  Follow up September as scheduled.  Signed, Satira Sark, MD, [facility] 05/06/2020 1:23 PM    [facility] [place], [place], [place] [address] Phone: [phone]; Fax: [phone]

## 2020-05-06 ENCOUNTER — Ambulatory Visit (INDEPENDENT_AMBULATORY_CARE_PROVIDER_SITE_OTHER): Payer: Medicare Other | Admitting: Cardiology

## 2020-05-06 ENCOUNTER — Other Ambulatory Visit: Payer: Self-pay

## 2020-05-06 ENCOUNTER — Encounter: Payer: Self-pay | Admitting: Cardiology

## 2020-05-06 VITALS — BP 147/64 | HR 54 | Ht 72.0 in | Wt 280.4 lb

## 2020-05-06 DIAGNOSIS — Z87898 Personal history of other specified conditions: Secondary | ICD-10-CM | POA: Diagnosis not present

## 2020-05-06 DIAGNOSIS — R001 Bradycardia, unspecified: Secondary | ICD-10-CM

## 2020-05-06 MED ORDER — CARVEDILOL 3.125 MG PO TABS: 3.1250 mg | ORAL_TABLET | Freq: Two times a day (BID) | ORAL | 1 refills | Status: DC

## 2020-05-06 NOTE — Patient Instructions (Addendum)
Your physician recommends that you schedule a follow-up appointment in: AS SCHEDULED WITH DR MCDOWELL  Your physician has recommended you make the following change in your medication:   DECREASE COREG 3.125 MG TWICE DAILY   WE HAVE GIVEN YOU RX FOR COMPRESSION STOCKINGS  Thank you for choosing [facility]!!

## 2020-05-11 ENCOUNTER — Encounter: Payer: Self-pay | Admitting: Family Medicine

## 2020-05-15 ENCOUNTER — Other Ambulatory Visit: Payer: Self-pay | Admitting: Family Medicine

## 2020-05-19 ENCOUNTER — Other Ambulatory Visit: Payer: Self-pay | Admitting: Family Medicine

## 2020-05-19 DIAGNOSIS — M1A00X0 Idiopathic chronic gout, unspecified site, without tophus (tophi): Secondary | ICD-10-CM

## 2020-05-19 DIAGNOSIS — N400 Enlarged prostate without lower urinary tract symptoms: Secondary | ICD-10-CM

## 2020-05-19 DIAGNOSIS — E782 Mixed hyperlipidemia: Secondary | ICD-10-CM

## 2020-06-19 ENCOUNTER — Other Ambulatory Visit: Payer: Self-pay | Admitting: Family Medicine

## 2020-06-22 NOTE — Telephone Encounter (Signed)
rx for furosemide was sent in 05/18/20 for 81m day supply and pt will be due for follow up prior to needing new rx.

## 2020-06-30 ENCOUNTER — Other Ambulatory Visit: Payer: Self-pay | Admitting: Family Medicine

## 2020-07-02 ENCOUNTER — Other Ambulatory Visit: Payer: Self-pay | Admitting: Family Medicine

## 2020-07-02 MED ORDER — FUROSEMIDE 40 MG PO TABS: ORAL_TABLET | ORAL | 0 refills | Status: DC

## 2020-07-02 NOTE — Telephone Encounter (Signed)
Pt is requesting the rx to go to [facility] in [place] as he is out of the medication.

## 2020-07-16 DIAGNOSIS — B351 Tinea unguium: Secondary | ICD-10-CM | POA: Diagnosis not present

## 2020-07-16 DIAGNOSIS — L03031 Cellulitis of right toe: Secondary | ICD-10-CM | POA: Diagnosis not present

## 2020-07-19 ENCOUNTER — Other Ambulatory Visit: Payer: Self-pay | Admitting: Family Medicine

## 2020-07-19 DIAGNOSIS — I10 Essential (primary) hypertension: Secondary | ICD-10-CM

## 2020-07-28 ENCOUNTER — Ambulatory Visit: Payer: Medicare Other | Admitting: Family Medicine

## 2020-08-11 ENCOUNTER — Encounter: Payer: Self-pay | Admitting: Family Medicine

## 2020-08-11 ENCOUNTER — Ambulatory Visit (INDEPENDENT_AMBULATORY_CARE_PROVIDER_SITE_OTHER): Payer: Medicare Other | Admitting: Family Medicine

## 2020-08-11 ENCOUNTER — Other Ambulatory Visit: Payer: Self-pay

## 2020-08-11 VITALS — BP 134/69 | HR 56 | Temp 97.8°F | Resp 20 | Ht 72.0 in | Wt 273.0 lb

## 2020-08-11 DIAGNOSIS — R600 Localized edema: Secondary | ICD-10-CM

## 2020-08-11 DIAGNOSIS — I872 Venous insufficiency (chronic) (peripheral): Secondary | ICD-10-CM | POA: Diagnosis not present

## 2020-08-11 DIAGNOSIS — L03115 Cellulitis of right lower limb: Secondary | ICD-10-CM

## 2020-08-11 MED ORDER — AMOXICILLIN-POT CLAVULANATE 875-125 MG PO TABS
1.0000 | ORAL_TABLET | Freq: Two times a day (BID) | ORAL | 0 refills | Status: DC
Start: 2020-08-11 — End: 2020-08-11

## 2020-08-11 MED ORDER — AMOXICILLIN-POT CLAVULANATE 875-125 MG PO TABS: 1.0000 | ORAL_TABLET | Freq: Two times a day (BID) | ORAL | 0 refills | Status: DC

## 2020-08-11 NOTE — Progress Notes (Signed)
Subjective:  Patient ID: Mitchell Parker., male    DOB: January 01, 1951  Age: 69 y.o. MRN: [id number]  CC: Medical Management of Chronic Issues   HPI Mitchell BRODIE Sr. presents for increased swelling in the right leg.. It goes down overnight. It swells again through the day and feels tight. He has had DVT in the past. Was finally taken off of coumadin 2 years ago. No problem until now. Pt. Works at [employer] on [address].   Depression screen [facility] 2/9 08/11/2020 04/29/2020 05/09/2019  Decreased Interest 0 0 0  Down, Depressed, Hopeless 0 0 0  PHQ - 2 Score 0 0 0  Altered sleeping - - -  Tired, decreased energy - - -  Change in appetite - - -  Feeling bad or failure about yourself  - - -  Trouble concentrating - - -  Moving slowly or fidgety/restless - - -  Suicidal thoughts - - -  PHQ-9 Score - - -  Difficult doing work/chores - - -    History Maleik has a past medical history of Cholelithiasis with cholecystitis (01/04/2012), Chronic diarrhea (04/17/2012), CKD (chronic kidney disease) stage 3, GFR 30-59 ml/min, COPD (chronic obstructive pulmonary disease) (HCC), Essential hypertension, Gallstones, Gout, History of colonic polyps (04/10/2017), History of DVT (deep vein thrombosis), Kidney stones, Lumbar disc disease, Neuropathy, Nonischemic cardiomyopathy (HCC), Obesity, Orthostatic hypotension (09/09/2018), RA (rheumatoid arthritis) (HCC), Sleep apnea, Syncope, vasovagal (09/07/2018), and Type 2 diabetes mellitus (HCC).   He has a past surgical history that includes Umbilical hernia repair; Pilonidal cyst excision (1997); Varicose vein surgery; Cholecystectomy (01/18/2012); Bariatric Surgery (01/11/2018); and Eye surgery.   His family history includes Diabetes in his brother and father; Heart disease in his father; Hypertension in his father and mother; Stroke in his mother.He reports that he quit smoking about 25 years ago. His smoking use included cigarettes. He has never used smokeless  tobacco. He reports that he does not drink alcohol and does not use drugs.    ROS Review of Systems  Constitutional: Negative for fever.  Respiratory: Negative for shortness of breath.   Cardiovascular: Negative for chest pain.  Musculoskeletal: Negative for arthralgias.  Skin: Negative for rash.    Objective:  BP 134/69   Pulse (!) 56   Temp 97.8 F (36.6 C) (Temporal)   Resp 20   Ht 6' (1.829 m)   Wt 273 lb (123.8 kg)   SpO2 99%   BMI 37.03 kg/m   BP Readings from Last 3 Encounters:  08/11/20 134/69  05/06/20 (!) 147/64  04/29/20 (!) 127/58    Wt Readings from Last 3 Encounters:  08/11/20 273 lb (123.8 kg)  05/06/20 280 lb 6.4 oz (127.2 kg)  04/29/20 277 lb 3.2 oz (125.7 kg)     Physical Exam Vitals reviewed.  Constitutional:      Appearance: He is well-developed.  HENT:     Head: Normocephalic and atraumatic.     Right Ear: External ear normal.     Left Ear: External ear normal.     Mouth/Throat:     Pharynx: No oropharyngeal exudate or posterior oropharyngeal erythema.  Eyes:     Pupils: Pupils are equal, round, and reactive to light.  Cardiovascular:     Rate and Rhythm: Normal rate and regular rhythm.     Heart sounds: No murmur heard.   Pulmonary:     Effort: No respiratory distress.     Breath sounds: Normal breath sounds.  Musculoskeletal:  General: Swelling present.     Cervical back: Normal range of motion and neck supple.     Right lower leg: Edema (3+ to tibial tuberosity) present.  Skin:    Findings: Erythema present. Rash: mid calf, leg on right.  Neurological:     Mental Status: He is alert and oriented to person, place, and time.       Assessment & Plan:   Kamaree was seen today for medical management of chronic issues.  Diagnoses and all orders for this visit:  Edema of right lower leg -     US Venous Img Lower Bilateral (DVT); Future  Cellulitis of right lower leg  Venous stasis dermatitis of right lower  extremity  Other orders -     Discontinue: amoxicillin-clavulanate (AUGMENTIN) 875-125 MG tablet; Take 1 tablet by mouth 2 (two) times daily. Take all of this medication -     amoxicillin-clavulanate (AUGMENTIN) 875-125 MG tablet; Take 1 tablet by mouth 2 (two) times daily. Take all of this medication       I have discontinued [name] "Mitchell Parker"'s amoxicillin-clavulanate. I am also having him start on amoxicillin-clavulanate. Additionally, I am having him maintain his HYDROXYCHLOROQUINE SULFATE PO, albuterol-ipratropium, multivitamin, calcium carbonate, vitamin B-12, fluticasone, acetaminophen, mometasone, celecoxib, neomycin-polymyxin-hydrocortisone, lisinopril, pantoprazole, potassium chloride SA, Colchicine, DULoxetine, carvedilol, atorvastatin, tamsulosin, allopurinol, calcitRIOL, furosemide, and amLODipine.  Allergies as of 08/11/2020      Reactions   Ciprofloxacin Hives   Definity [perflutren Lipid Microsphere] Nausea And Vomiting   During ECHO with Definity injection pt became nauseated and vomited    Levofloxacin [levofloxacin] Hives      Medication List       Accurate as of August 11, 2020  9:59 PM. If you have any questions, ask your nurse or doctor.        acetaminophen 500 MG tablet Commonly known as: TYLENOL Take 500 mg by mouth every 6 (six) hours as needed.   albuterol-ipratropium 18-103 MCG/ACT inhaler Commonly known as: COMBIVENT Inhale 2 puffs into the lungs every 6 (six) hours as needed. Wheezing   allopurinol 300 MG tablet Commonly known as: ZYLOPRIM TAKE 1 TABLET BY MOUTH IN  THE MORNING   amLODipine 5 MG tablet Commonly known as: NORVASC Take 1 tablet (5 mg total) by mouth daily. for blood pressure  Needs to be seen before next refill   amoxicillin-clavulanate 875-125 MG tablet Commonly known as: AUGMENTIN Take 1 tablet by mouth 2 (two) times daily. Take all of this medication Started by: [name], MD   atorvastatin 80 MG  tablet Commonly known as: LIPITOR TAKE 1 TABLET BY MOUTH  DAILY AT 6 PM   calcitRIOL 0.25 MCG capsule Commonly known as: ROCALTROL TAKE 1 CAPSULE BY MOUTH  DAILY WITH BREAKFAST   calcium carbonate 1500 (600 Ca) MG Tabs tablet Commonly known as: OSCAL Take 600 mg of elemental calcium by mouth 2 (two) times daily with a meal.   carvedilol 3.125 MG tablet Commonly known as: COREG Take 1 tablet (3.125 mg total) by mouth 2 (two) times daily.   celecoxib 200 MG capsule Commonly known as: CELEBREX TAKE 1 CAPSULE BY MOUTH  DAILY WITH FOOD   Colchicine 0.6 MG Caps Take 0.6 mg by mouth daily.   DULoxetine 30 MG capsule Commonly known as: Cymbalta Take 1 capsule (30 mg total) by mouth 2 (two) times daily.   fluticasone 50 MCG/ACT nasal spray Commonly known as: FLONASE USE 2 SPRAYS INTO BOTH  NOSTRILS DAILY AS NEEDED  FOR ALLERGIES OR RHINITIS.   furosemide 40 MG tablet Commonly known as: LASIX TAKE 1 TABLET BY MOUTH  DAILY IN THE MORNING   HYDROXYCHLOROQUINE SULFATE PO Take 300 mg by mouth 2 (two) times daily.   lisinopril 20 MG tablet Commonly known as: ZESTRIL Take 1 tablet (20 mg total) by mouth daily.   mometasone 0.1 % cream Commonly known as: ELOCON Apply topically daily.   multivitamin capsule Take 1 capsule by mouth daily.   neomycin-polymyxin-hydrocortisone 3.5-10000-1 OTIC suspension Commonly known as: CORTISPORIN INSTILL 4 DROPS INTO THE  AFFECTED EARS TWICE DAILY  FOR 10 DAYS   pantoprazole 40 MG tablet Commonly known as: PROTONIX TAKE 1 TABLET BY MOUTH  DAILY   potassium chloride SA 20 MEQ tablet Commonly known as: KLOR-CON TAKE 1 TABLET BY MOUTH  DAILY FOR POTASSIUM  REPLACEMENT/ SUPPLEMENT   tamsulosin 0.4 MG Caps capsule Commonly known as: FLOMAX TAKE 1 CAPSULE BY MOUTH AT  BEDTIME   vitamin B-12 500 MCG tablet Commonly known as: CYANOCOBALAMIN Take 500 mcg by mouth daily.        Follow-up: Return in about 1 week (around 08/18/2020) for leg  edema.  [name], M.D.

## 2020-08-12 ENCOUNTER — Ambulatory Visit (HOSPITAL_COMMUNITY): Payer: Medicare Other

## 2020-08-12 ENCOUNTER — Telehealth: Payer: Self-pay | Admitting: Family Medicine

## 2020-08-12 ENCOUNTER — Other Ambulatory Visit: Payer: Self-pay | Admitting: Family Medicine

## 2020-08-12 DIAGNOSIS — M1A00X0 Idiopathic chronic gout, unspecified site, without tophus (tophi): Secondary | ICD-10-CM

## 2020-08-12 DIAGNOSIS — N400 Enlarged prostate without lower urinary tract symptoms: Secondary | ICD-10-CM

## 2020-08-12 MED ORDER — CEPHALEXIN 500 MG PO CAPS
500.0000 mg | ORAL_CAPSULE | Freq: Three times a day (TID) | ORAL | 1 refills | Status: DC
Start: 2020-08-12 — End: 2020-08-28

## 2020-08-12 NOTE — Telephone Encounter (Signed)
Pts wife is aware 

## 2020-08-12 NOTE — Telephone Encounter (Signed)
Isent in a new antibiotic to the local pharmacy

## 2020-08-12 NOTE — Telephone Encounter (Signed)
Pt says that he called [facility] last night and they did fill amoxicillin-clavulanate (AUGMENTIN) 875-125 MG tablet and now [facility] cannot [facility] it. Please call back to cancel so [facility] it. Please call back when done

## 2020-08-12 NOTE — Telephone Encounter (Signed)
[facility] has already shipped the Augmentin but it will take 8 days to get to him. [facility] can't fill it [facility] 08/19/20 for insurance to cover it. Is there another antibiotic that could be sent over or?

## 2020-08-13 ENCOUNTER — Ambulatory Visit (HOSPITAL_COMMUNITY)
Admission: RE | Admit: 2020-08-13 | Discharge: 2020-08-13 | Disposition: A | Discharge status: Home / Self Care | Payer: Medicare Other | Source: Ambulatory Visit | Attending: Family Medicine | Admitting: Family Medicine

## 2020-08-13 ENCOUNTER — Other Ambulatory Visit: Payer: Self-pay | Admitting: Family Medicine

## 2020-08-13 ENCOUNTER — Other Ambulatory Visit: Payer: Self-pay

## 2020-08-13 DIAGNOSIS — R600 Localized edema: Secondary | ICD-10-CM | POA: Diagnosis not present

## 2020-08-17 DIAGNOSIS — S91112A Laceration without foreign body of left great toe without damage to nail, initial encounter: Secondary | ICD-10-CM | POA: Diagnosis not present

## 2020-08-19 ENCOUNTER — Ambulatory Visit (INDEPENDENT_AMBULATORY_CARE_PROVIDER_SITE_OTHER): Payer: Medicare Other | Admitting: Family Medicine

## 2020-08-19 ENCOUNTER — Encounter: Payer: Self-pay | Admitting: Family Medicine

## 2020-08-19 ENCOUNTER — Other Ambulatory Visit: Payer: Self-pay | Admitting: Family Medicine

## 2020-08-19 ENCOUNTER — Other Ambulatory Visit: Payer: Self-pay

## 2020-08-19 VITALS — BP 133/65 | HR 67 | Temp 97.7°F | Ht 72.0 in | Wt 276.0 lb

## 2020-08-19 DIAGNOSIS — M1A00X0 Idiopathic chronic gout, unspecified site, without tophus (tophi): Secondary | ICD-10-CM

## 2020-08-19 DIAGNOSIS — I872 Venous insufficiency (chronic) (peripheral): Secondary | ICD-10-CM | POA: Insufficient documentation

## 2020-08-19 DIAGNOSIS — I10 Essential (primary) hypertension: Secondary | ICD-10-CM | POA: Diagnosis not present

## 2020-08-19 DIAGNOSIS — R739 Hyperglycemia, unspecified: Secondary | ICD-10-CM | POA: Diagnosis not present

## 2020-08-19 LAB — BAYER DCA HB A1C WAIVED: HB A1C (BAYER DCA - WAIVED): 6 % (ref <7.0)

## 2020-08-19 NOTE — Progress Notes (Signed)
Subjective:  Patient ID: Mitchell Parker., male    DOB: 10/01/1951  Age: 69 y.o. MRN: [id number]  CC: 1 week recheck (Leg edema)   HPI Mitchell GABLER Sr. presents for right leg is less red. Still some edema. No pain. Right leg only.  He had the Doppler for DVT and it was negative for anything in the extremity.  Comments by the radiologist for the iliac DVT could still be present.  Patient also is doing A1c 3 months ago was 5.7 which is excellent.  Patient does not have his blood sugar readings with him today.  History Mitchell Parker has a past medical history of Cholelithiasis with cholecystitis (01/04/2012), Chronic diarrhea (04/17/2012), CKD (chronic kidney disease) stage 3, GFR 30-59 ml/min, COPD (chronic obstructive pulmonary disease) (HCC), DVT (deep venous thrombosis) (HCC) (04/19/2012), Essential hypertension, Gallstones, Gout, History of colonic polyps (04/10/2017), History of DVT (deep vein thrombosis), Kidney stones, Lumbar disc disease, Neuropathy, Nonischemic cardiomyopathy (HCC), Obesity, Orthostatic hypotension (09/09/2018), RA (rheumatoid arthritis) (HCC), Sleep apnea, Subarachnoid hemorrhage (HCC) (09/07/2018), Subdural hematoma (HCC) (09/08/2018), Syncope, vasovagal (09/07/2018), and Type 2 diabetes mellitus (HCC).   He has a past surgical history that includes Umbilical hernia repair; Pilonidal cyst excision (1997); Varicose vein surgery; Cholecystectomy (01/18/2012); Bariatric Surgery (01/11/2018); and Eye surgery.   His family history includes Diabetes in his brother and father; Heart disease in his father; Hypertension in his father and mother; Stroke in his mother.He reports that he quit smoking about 25 years ago. His smoking use included cigarettes. He has never used smokeless tobacco. He reports that he does not drink alcohol and does not use drugs.    ROS Review of Systems  Constitutional: Positive for activity change (Patient took my advice and took a week off from work.  He now needs a  note for that.  He has been keeping his leg elevated.).  Respiratory: Negative for shortness of breath.   Cardiovascular: Positive for leg swelling. Negative for chest pain and palpitations.    Objective:  BP 133/65   Pulse 67   Temp 97.7 F (36.5 C) (Temporal)   Ht 6' (1.829 m)   Wt 276 lb (125.2 kg)   BMI 37.43 kg/m   BP Readings from Last 3 Encounters:  08/19/20 133/65  08/11/20 134/69  05/06/20 (!) 147/64    Wt Readings from Last 3 Encounters:  08/19/20 276 lb (125.2 kg)  08/11/20 273 lb (123.8 kg)  05/06/20 280 lb 6.4 oz (127.2 kg)     Physical Exam Vitals reviewed.  Constitutional:      Appearance: He is well-developed.  HENT:     Head: Normocephalic and atraumatic.     Right Ear: External ear normal.     Left Ear: External ear normal.     Mouth/Throat:     Pharynx: No oropharyngeal exudate or posterior oropharyngeal erythema.  Eyes:     Pupils: Pupils are equal, round, and reactive to light.  Cardiovascular:     Rate and Rhythm: Normal rate and regular rhythm.     Heart sounds: No murmur heard.   Pulmonary:     Effort: No respiratory distress.     Breath sounds: Normal breath sounds.  Musculoskeletal:     Cervical back: Normal range of motion and neck supple.     Right lower leg: Edema (2+, decreased, with resolution of erythema.) present.  Neurological:     Mental Status: He is alert and oriented to person, place, and time.  Assessment & Plan:   [name] was seen today for 1 week recheck.  Diagnoses and all orders for this visit:  Essential hypertension -     Bayer DCA Hb A1c Waived  Venous insufficiency of right leg       I am having [name] "bill" maintain his HYDROXYCHLOROQUINE SULFATE PO, albuterol-ipratropium, multivitamin, calcium carbonate, vitamin B-12, fluticasone, acetaminophen, mometasone, celecoxib, neomycin-polymyxin-hydrocortisone, lisinopril, pantoprazole, potassium chloride SA, Colchicine, DULoxetine,  carvedilol, atorvastatin, calcitRIOL, amLODipine, cephALEXin, tamsulosin, allopurinol, and furosemide.  Allergies as of 08/19/2020      Reactions   Ciprofloxacin Hives   Definity [perflutren Lipid Microsphere] Nausea And Vomiting   During ECHO with Definity injection pt became nauseated and vomited    Levofloxacin [levofloxacin] Hives      Medication List       Accurate as of August 19, 2020  9:19 AM. If you have any questions, ask your nurse or doctor.        acetaminophen 500 MG tablet Commonly known as: TYLENOL Take 500 mg by mouth every 6 (six) hours as needed.   albuterol-ipratropium 18-103 MCG/ACT inhaler Commonly known as: COMBIVENT Inhale 2 puffs into the lungs every 6 (six) hours as needed. Wheezing   allopurinol 300 MG tablet Commonly known as: ZYLOPRIM TAKE 1 TABLET BY MOUTH IN  THE MORNING   amLODipine 5 MG tablet Commonly known as: NORVASC Take 1 tablet (5 mg total) by mouth daily. for blood pressure  Needs to be seen before next refill   atorvastatin 80 MG tablet Commonly known as: LIPITOR TAKE 1 TABLET BY MOUTH  DAILY AT 6 PM   calcitRIOL 0.25 MCG capsule Commonly known as: ROCALTROL TAKE 1 CAPSULE BY MOUTH  DAILY WITH BREAKFAST   calcium carbonate 1500 (600 Ca) MG Tabs tablet Commonly known as: OSCAL Take 600 mg of elemental calcium by mouth 2 (two) times daily with a meal.   carvedilol 3.125 MG tablet Commonly known as: COREG Take 1 tablet (3.125 mg total) by mouth 2 (two) times daily.   celecoxib 200 MG capsule Commonly known as: CELEBREX TAKE 1 CAPSULE BY MOUTH  DAILY WITH FOOD   cephALEXin 500 MG capsule Commonly known as: KEFLEX Take 1 capsule (500 mg total) by mouth 3 (three) times daily.   Colchicine 0.6 MG Caps Take 0.6 mg by mouth daily.   DULoxetine 30 MG capsule Commonly known as: Cymbalta Take 1 capsule (30 mg total) by mouth 2 (two) times daily.   fluticasone 50 MCG/ACT nasal spray Commonly known as: FLONASE USE 2 SPRAYS  INTO BOTH  NOSTRILS DAILY AS NEEDED  FOR ALLERGIES OR RHINITIS.   furosemide 40 MG tablet Commonly known as: LASIX TAKE 1 TABLET BY MOUTH  DAILY IN THE MORNING   HYDROXYCHLOROQUINE SULFATE PO Take 300 mg by mouth 2 (two) times daily.   lisinopril 20 MG tablet Commonly known as: ZESTRIL Take 1 tablet (20 mg total) by mouth daily.   mometasone 0.1 % cream Commonly known as: ELOCON Apply topically daily.   multivitamin capsule Take 1 capsule by mouth daily.   neomycin-polymyxin-hydrocortisone 3.5-10000-1 OTIC suspension Commonly known as: CORTISPORIN INSTILL 4 DROPS INTO THE  AFFECTED EARS TWICE DAILY  FOR 10 DAYS   pantoprazole 40 MG tablet Commonly known as: PROTONIX TAKE 1 TABLET BY MOUTH  DAILY   potassium chloride SA 20 MEQ tablet Commonly known as: KLOR-CON TAKE 1 TABLET BY MOUTH  DAILY FOR POTASSIUM  REPLACEMENT/ SUPPLEMENT   tamsulosin 0.4 MG Caps capsule Commonly  known as: FLOMAX TAKE 1 CAPSULE BY MOUTH AT  BEDTIME   vitamin B-12 500 MCG tablet Commonly known as: CYANOCOBALAMIN Take 500 mcg by mouth daily.        Follow-up: Return in about 3 months (around 11/19/2020), or if symptoms worsen or fail to improve.  [name], M.D.

## 2020-08-25 DIAGNOSIS — Z4802 Encounter for removal of sutures: Secondary | ICD-10-CM | POA: Diagnosis not present

## 2020-08-25 DIAGNOSIS — L03032 Cellulitis of left toe: Secondary | ICD-10-CM | POA: Diagnosis not present

## 2020-08-28 ENCOUNTER — Encounter: Payer: Self-pay | Admitting: Family

## 2020-08-28 ENCOUNTER — Ambulatory Visit (INDEPENDENT_AMBULATORY_CARE_PROVIDER_SITE_OTHER): Payer: Medicare Other | Admitting: Family

## 2020-08-28 ENCOUNTER — Ambulatory Visit (INDEPENDENT_AMBULATORY_CARE_PROVIDER_SITE_OTHER): Payer: Medicare Other

## 2020-08-28 VITALS — BP 161/61 | HR 49 | Temp 96.9°F | Ht 72.0 in | Wt 281.8 lb

## 2020-08-28 DIAGNOSIS — S91101A Unspecified open wound of right great toe without damage to nail, initial encounter: Secondary | ICD-10-CM

## 2020-08-28 DIAGNOSIS — S91103A Unspecified open wound of unspecified great toe without damage to nail, initial encounter: Secondary | ICD-10-CM

## 2020-08-28 DIAGNOSIS — E118 Type 2 diabetes mellitus with unspecified complications: Secondary | ICD-10-CM | POA: Diagnosis not present

## 2020-08-28 MED ORDER — CEPHALEXIN 500 MG PO CAPS: 500.0000 mg | ORAL_CAPSULE | Freq: Three times a day (TID) | ORAL | 0 refills | Status: DC

## 2020-08-28 NOTE — Patient Instructions (Signed)
Diabetes Mellitus and Foot Care Foot care is an important part of your health, especially when you have diabetes. Diabetes may cause you to have problems because of poor blood flow (circulation) to your feet and legs, which can cause your skin to:  Become thinner and drier.  Break more easily.  Heal more slowly.  Peel and crack. You may also have nerve damage (neuropathy) in your legs and feet, causing decreased feeling in them. This means that you may not notice minor injuries to your feet that could lead to more serious problems. Noticing and addressing any potential problems early is the best way to prevent future foot problems. How to care for your feet Foot hygiene  Wash your feet daily with warm water and mild soap. Do not use hot water. Then, pat your feet and the areas between your toes until they are completely dry. Do not soak your feet as this can dry your skin.  Trim your toenails straight across. Do not dig under them or around the cuticle. File the edges of your nails with an emery board or nail file.  Apply a moisturizing lotion or petroleum jelly to the skin on your feet and to dry, brittle toenails. Use lotion that does not contain alcohol and is unscented. Do not apply lotion between your toes. Shoes and socks  Wear clean socks or stockings every day. Make sure they are not too tight. Do not wear knee-high stockings since they may decrease blood flow to your legs.  Wear shoes that fit properly and have enough cushioning. Always look in your shoes before you put them on to be sure there are no objects inside.  To break in new shoes, wear them for just a few hours a day. This prevents injuries on your feet. Wounds, scrapes, corns, and calluses  Check your feet daily for blisters, cuts, bruises, sores, and redness. If you cannot see the bottom of your feet, use a mirror or ask someone for help.  Do not cut corns or calluses or try to remove them with medicine.  If you  find a minor scrape, cut, or break in the skin on your feet, keep it and the skin around it clean and dry. You may clean these areas with mild soap and water. Do not clean the area with peroxide, alcohol, or iodine.  If you have a wound, scrape, corn, or callus on your foot, look at it several times a day to make sure it is healing and not infected. Check for: ? Redness, swelling, or pain. ? Fluid or blood. ? Warmth. ? Pus or a bad smell. General instructions  Do not cross your legs. This may decrease blood flow to your feet.  Do not use heating pads or hot water bottles on your feet. They may burn your skin. If you have lost feeling in your feet or legs, you may not know this is happening until it is too late.  Protect your feet from hot and cold by wearing shoes, such as at the beach or on hot pavement.  Schedule a complete foot exam at least once a year (annually) or more often if you have foot problems. If you have foot problems, report any cuts, sores, or bruises to your health care provider immediately. Contact a health care provider if:  You have a medical condition that increases your risk of infection and you have any cuts, sores, or bruises on your feet.  You have an injury that is not   healing.  You have redness on your legs or feet.  You feel burning or tingling in your legs or feet.  You have pain or cramps in your legs and feet.  Your legs or feet are numb.  Your feet always feel cold.  You have pain around a toenail. Get help right away if:  You have a wound, scrape, corn, or callus on your foot and: ? You have pain, swelling, or redness that gets worse. ? You have fluid or blood coming from the wound, scrape, corn, or callus. ? Your wound, scrape, corn, or callus feels warm to the touch. ? You have pus or a bad smell coming from the wound, scrape, corn, or callus. ? You have a fever. ? You have a red line going up your leg. Summary  Check your feet every day  for cuts, sores, red spots, swelling, and blisters.  Moisturize feet and legs daily.  Wear shoes that fit properly and have enough cushioning.  If you have foot problems, report any cuts, sores, or bruises to your health care provider immediately.  Schedule a complete foot exam at least once a year (annually) or more often if you have foot problems. This information is not intended to replace advice given to you by your health care provider. Make sure you discuss any questions you have with your health care provider. Document Revised: 09/04/2019 Document Reviewed: 01/13/2017 Elsevier Patient Education  2020 [employer]  

## 2020-08-28 NOTE — Progress Notes (Signed)
Subjective:    Patient ID: Mitchell Jarred., male    DOB: 05-19-1951, 69 y.o.   MRN: [id number]  Chief Complaint  Patient presents with  . Puncture Wound    big toe on right foot injury, went to urgent care 10 days ago was streated and stiches, also has another sore on toe not realated to injury    HPI PT presents to the office today for wound check on right great toe. He was seen at the Urgent Care on 08/17/20 and received 7 sutures and was placed on Rocephin and Augmentin. He went back 10 days later to have sutures removed and was given silvadene cream and another rocephin.  He states that night his toe opened up. Denies any pain, discharge at this time. But noticed a   He is a diabetic and his glucose this morning was 89. His A1C was 6.0 on 08/19/20.    Review of Systems  All other systems reviewed and are negative.      Objective:   Physical Exam Vitals reviewed.  Constitutional:      General: He is not in acute distress.    Appearance: He is well-developed. He is obese.  HENT:     Head: Normocephalic.  Eyes:     General:        Right eye: No discharge.        Left eye: No discharge.     Pupils: Pupils are equal, round, and reactive to light.  Neck:     Thyroid: No thyromegaly.  Cardiovascular:     Rate and Rhythm: Normal rate and regular rhythm.     Heart sounds: Normal heart sounds. No murmur heard.   Pulmonary:     Effort: Pulmonary effort is normal. No respiratory distress.     Breath sounds: Normal breath sounds. No wheezing.  Abdominal:     General: Bowel sounds are normal. There is no distension.     Palpations: Abdomen is soft.     Tenderness: There is no abdominal tenderness.  Musculoskeletal:        General: No tenderness. Normal range of motion.     Cervical back: Normal range of motion and neck supple.  Skin:    General: Skin is warm and dry.     Findings: Erythema present. No rash.     Comments: Right great toe erythemas and swelling and  tenderness, diabetic ulcer on dorsal great toe, yellow slough, good growth on tip of great toe  Neurological:     Mental Status: He is alert and oriented to person, place, and time.     Cranial Nerves: No cranial nerve deficit.     Deep Tendon Reflexes: Reflexes are normal and symmetric.  Psychiatric:        Behavior: Behavior normal.        Thought Content: Thought content normal.        Judgment: Judgment normal.           BP (!) 161/61   Pulse (!) 49   Temp (!) 96.9 F (36.1 C) (Temporal)   Ht 6' (1.829 m)   Wt 281 lb 12.8 oz (127.8 kg)   SpO2 100%   BMI 38.22 kg/m      Assessment & Plan:  Mitchell LIPARI Sr. comes in today with chief complaint of Puncture Wound (big toe on right foot injury, went to urgent care 10 days ago was streated and stiches, also has another sore on toe not  realated to injury)   Diagnosis and orders addressed:  1. Open wound of great toe, initial encounter - DG Toe Great Right; Future - Ambulatory referral to Podiatry  2. Diabetic foot (HCC) - cephALEXin (KEFLEX) 500 MG capsule; Take 1 capsule (500 mg total) by mouth 3 (three) times daily.  Dispense: 21 capsule; Refill: 0 - Ambulatory referral to Podiatry  Will start Keflex  Will do urgent referral to Podiatry, she does she Dr. Ulice Brilliant Report any increase redness, discharge, and tenderness   Jannifer Rodney, FNP

## 2020-09-01 DIAGNOSIS — L97511 Non-pressure chronic ulcer of other part of right foot limited to breakdown of skin: Secondary | ICD-10-CM | POA: Diagnosis not present

## 2020-09-01 DIAGNOSIS — E1142 Type 2 diabetes mellitus with diabetic polyneuropathy: Secondary | ICD-10-CM | POA: Diagnosis not present

## 2020-09-02 ENCOUNTER — Other Ambulatory Visit: Payer: Self-pay | Admitting: Cardiology

## 2020-09-04 ENCOUNTER — Other Ambulatory Visit: Payer: Self-pay | Admitting: Family

## 2020-09-04 DIAGNOSIS — Z0100 Encounter for examination of eyes and vision without abnormal findings: Secondary | ICD-10-CM | POA: Diagnosis not present

## 2020-09-04 DIAGNOSIS — E119 Type 2 diabetes mellitus without complications: Secondary | ICD-10-CM | POA: Diagnosis not present

## 2020-09-05 ENCOUNTER — Other Ambulatory Visit: Payer: Self-pay | Admitting: Family Medicine

## 2020-09-06 ENCOUNTER — Other Ambulatory Visit: Payer: Self-pay | Admitting: Family Medicine

## 2020-09-06 DIAGNOSIS — I10 Essential (primary) hypertension: Secondary | ICD-10-CM

## 2020-09-07 ENCOUNTER — Other Ambulatory Visit: Payer: Self-pay | Admitting: Family Medicine

## 2020-09-07 ENCOUNTER — Other Ambulatory Visit: Payer: Self-pay

## 2020-09-07 DIAGNOSIS — I10 Essential (primary) hypertension: Secondary | ICD-10-CM

## 2020-09-07 MED ORDER — AMLODIPINE BESYLATE 5 MG PO TABS: 5.0000 mg | ORAL_TABLET | Freq: Every day | ORAL | 0 refills | Status: DC

## 2020-09-08 ENCOUNTER — Other Ambulatory Visit: Payer: Self-pay | Admitting: Family

## 2020-09-08 DIAGNOSIS — I10 Essential (primary) hypertension: Secondary | ICD-10-CM

## 2020-09-08 MED ORDER — AMLODIPINE BESYLATE 5 MG PO TABS: 5.0000 mg | ORAL_TABLET | Freq: Every day | ORAL | 0 refills | Status: DC

## 2020-09-08 MED ORDER — NEOMYCIN-POLYMYXIN-HC 3.5-10000-1 OT SUSP: OTIC | 0 refills | Status: DC

## 2020-09-09 ENCOUNTER — Telehealth: Payer: Self-pay | Admitting: Cardiology

## 2020-09-09 NOTE — Telephone Encounter (Signed)
  Patient Consent for Virtual Visit         Mitchell Tal. has provided verbal consent on 09/09/2020 for a virtual visit (video or telephone).   CONSENT FOR VIRTUAL VISIT FOR:  Mitchell Rod Sr.  By participating in this virtual visit I agree to the following:  I hereby voluntarily request, consent and authorize [facility] and its employed or contracted physicians, physician assistants, nurse practitioners or other licensed health care professionals (the Practitioner), to provide me with telemedicine health care services (the "Services") as deemed necessary by the treating Practitioner. I acknowledge and consent to receive the Services by the Practitioner via telemedicine. I understand that the telemedicine visit will involve communicating with the Practitioner through live audiovisual communication technology and the disclosure of certain medical information by electronic transmission. I acknowledge that I have been given the opportunity to request an in-person assessment or other available alternative prior to the telemedicine visit and am voluntarily participating in the telemedicine visit.  I understand that I have the right to withhold or withdraw my consent to the use of telemedicine in the course of my care at any time, without affecting my right to future care or treatment, and that the Practitioner or I may terminate the telemedicine visit at any time. I understand that I have the right to inspect all information obtained and/or recorded in the course of the telemedicine visit and may receive copies of available information for a reasonable fee.  I understand that some of the potential risks of receiving the Services via telemedicine include:  [name] Delay or interruption in medical evaluation due to technological equipment failure or disruption; . Information transmitted may not be sufficient (e.g. poor resolution of images) to allow for appropriate medical decision making by the  Practitioner; and/or  . In rare instances, security protocols could fail, causing a breach of personal health information.  Furthermore, I acknowledge that it is my responsibility to provide information about my medical history, conditions and care that is complete and accurate to the best of my ability. I acknowledge that Practitioner's advice, recommendations, and/or decision may be based on factors not within their control, such as incomplete or inaccurate data provided by me or distortions of diagnostic images or specimens that may result from electronic transmissions. I understand that the practice of medicine is not an exact science and that Practitioner makes no warranties or guarantees regarding treatment outcomes. I acknowledge that a copy of this consent can be made available to me via my patient portal [facility] MyChart), or I can request a printed copy by calling the office of [facility].    I understand that my insurance will be billed for this visit.   I have read or had this consent read to me. . I understand the contents of this consent, which adequately explains the benefits and risks of the Services being provided via telemedicine.  . I have been provided ample opportunity to ask questions regarding this consent and the Services and have had my questions answered to my satisfaction. . I give my informed consent for the services to be provided through the use of telemedicine in my medical care

## 2020-09-10 DIAGNOSIS — L97521 Non-pressure chronic ulcer of other part of left foot limited to breakdown of skin: Secondary | ICD-10-CM | POA: Diagnosis not present

## 2020-09-10 NOTE — Progress Notes (Signed)
Virtual Visit via Telephone Note   This visit type was conducted due to national recommendations for restrictions regarding the COVID-19 Pandemic (e.g. social distancing) in an effort to limit this patient's exposure and mitigate transmission in our community.  Due to his co-morbid illnesses, this patient is at least at moderate risk for complications without adequate follow up.  This format is felt to be most appropriate for this patient at this time.  The patient did not have access to video technology/had technical difficulties with video requiring transitioning to audio format only (telephone).  All issues noted in this document were discussed and addressed.  No physical exam could be performed with this format.  Please refer to the patient's chart for his  consent to telehealth for [facility].    Date:  09/11/2020   ID:  Mitchell Rod Sr., DOB 07/08/1951, MRN [id number] The patient was identified using 2 identifiers.  Patient Location: Home Provider Location: Office/Clinic  PCP:  [name], MD  Cardiologist:  [name], MD Electrophysiologist:  None   Evaluation Performed:  Follow-Up Visit  Chief Complaint:  Cardiac follow-up  History of Present Illness:    Mitchell Fleming. is a 69 y.o. male last seen in May. We spoke by phone today. From a cardiac perspective, he states that he has been doing well. No unusual shortness of breath with typical activities, no exertional chest pain. He does have some lightheadedness when he squats or bends over and then stands up, no frank syncope or palpitations however.  I reviewed his medications which are outlined below. He remains bradycardic on very low-dose Coreg, but still not entirely clear that this is causing symptoms. We have continued with observation, understanding that we would need to stop this medicine as a next step if there were concerns about symptomatic bradycardia.  He states that he currently has a MRSA infection  of his right great toe after an injury. He is being managed by a podiatrist and is currently on antibiotics.   Past Medical History:  Diagnosis Date  . Cholelithiasis with cholecystitis 01/04/2012  . Chronic diarrhea 04/17/2012  . CKD (chronic kidney disease) stage 3, GFR 30-59 ml/min   . COPD (chronic obstructive pulmonary disease) (HCC)   . DVT (deep venous thrombosis) (HCC) 04/19/2012  . Essential hypertension   . Gallstones   . Gout   . History of colonic polyps 04/10/2017  . History of DVT (deep vein thrombosis)   . Kidney stones   . Lumbar disc disease   . Neuropathy   . Nonischemic cardiomyopathy (HCC)    Minor coronary atherosclerosis at cardiac catheterization 2003, LVEF initially 20% with normalization  . Obesity   . Orthostatic hypotension 09/09/2018  . RA (rheumatoid arthritis) (HCC)   . Sleep apnea   . Subarachnoid hemorrhage (HCC) 09/07/2018  . Subdural hematoma (HCC) 09/08/2018  . Syncope, vasovagal 09/07/2018  . Type 2 diabetes mellitus (HCC)    Past Surgical History:  Procedure Laterality Date  . BARIATRIC SURGERY  01/11/2018  . CHOLECYSTECTOMY  01/18/2012   Procedure: LAPAROSCOPIC CHOLECYSTECTOMY WITH INTRAOPERATIVE CHOLANGIOGRAM;  Surgeon: Maisie Fus A. Cornett, MD;  Location: [facility];  Service: General;  Laterality: N/A;  . EYE SURGERY     Film over retina was removed.  [name] PILONIDAL CYST EXCISION  1997  . UMBILICAL HERNIA REPAIR    . VARICOSE VEIN SURGERY     left leg     Current Meds  Medication Sig  . acetaminophen (TYLENOL) 500  MG tablet Take 500 mg by mouth every 6 (six) hours as needed.  [name] albuterol-ipratropium (COMBIVENT) 18-103 MCG/ACT inhaler Inhale 2 puffs into the lungs every 6 (six) hours as needed. Wheezing    . allopurinol (ZYLOPRIM) 300 MG tablet TAKE 1 TABLET BY MOUTH IN  THE MORNING  . amLODipine (NORVASC) 5 MG tablet TAKE 1 TABLET BY MOUTH  DAILY FOR BLOOD PRESSURE  . atorvastatin (LIPITOR) 80 MG tablet TAKE 1 TABLET BY MOUTH  DAILY AT 6 PM  .  calcitRIOL (ROCALTROL) 0.25 MCG capsule TAKE 1 CAPSULE BY MOUTH  DAILY WITH BREAKFAST  . calcium carbonate (OSCAL) 1500 (600 Ca) MG TABS tablet Take 600 mg of elemental calcium by mouth 2 (two) times daily with a meal.  . carvedilol (COREG) 3.125 MG tablet TAKE 1 TABLET BY MOUTH  TWICE DAILY .  [name] celecoxib (CELEBREX) 200 MG capsule TAKE 1 CAPSULE BY MOUTH  DAILY WITH FOOD  . Colchicine 0.6 MG CAPS TAKE 1 CAPSULE BY MOUTH  DAILY  . doxycycline (VIBRAMYCIN) 100 MG capsule Take 100 mg by mouth 2 (two) times daily.  . DULoxetine (CYMBALTA) 30 MG capsule Take 1 capsule (30 mg total) by mouth 2 (two) times daily.  . fluticasone (FLONASE) 50 MCG/ACT nasal spray USE 2 SPRAYS INTO BOTH  NOSTRILS DAILY AS NEEDED  FOR ALLERGIES OR RHINITIS.  . furosemide (LASIX) 40 MG tablet TAKE 1 TABLET BY MOUTH  DAILY IN THE MORNING  . HYDROXYCHLOROQUINE SULFATE PO Take 300 mg by mouth 2 (two) times daily.  [name] lisinopril (ZESTRIL) 20 MG tablet Take 1 tablet (20 mg total) by mouth daily.  . mometasone (ELOCON) 0.1 % cream Apply topically daily.  . Multiple Vitamin (MULTIVITAMIN) capsule Take 1 capsule by mouth daily.  [name] neomycin-polymyxin-hydrocortisone (CORTISPORIN) 3.5-10000-1 OTIC suspension INSTILL 4 DROPS INTO THE  AFFECTED EARS TWICE DAILY  FOR 10 DAYS  . pantoprazole (PROTONIX) 40 MG tablet TAKE 1 TABLET BY MOUTH  DAILY  . potassium chloride SA (KLOR-CON) 20 MEQ tablet TAKE 1 TABLET BY MOUTH  DAILY FOR POTASSIUM  REPLACEMENT/ SUPPLEMENT  . silver sulfADIAZINE (SILVADENE) 1 % cream Apply 1 application topically daily.  . tamsulosin (FLOMAX) 0.4 MG CAPS capsule TAKE 1 CAPSULE BY MOUTH AT  BEDTIME  . vitamin B-12 (CYANOCOBALAMIN) 500 MCG tablet Take 500 mcg by mouth daily.     Allergies:   Ciprofloxacin, Definity [perflutren lipid microsphere], and Levofloxacin [levofloxacin]   ROS:  No frank syncope.  Prior CV studies:   The following studies were reviewed today:  Echocardiogram 05/07/2018: Study  Conclusions  - Left ventricle: The cavity size was at the upper limits of normal. Wall thickness was increased in a pattern of mild LVH. Systolic function was normal. The estimated ejection fraction was in the range of 50% to 55%. Diffuse hypokinesis. Left ventricular diastolic function parameters were normal for the patient&'s age. - Aortic valve: Trileaflet; mildly calcified leaflets. - Mitral valve: Mildly calcified annulus. There was trivial regurgitation. - Right ventricle: The cavity size was mildly dilated. - Right atrium: Central venous pressure (est): 3 mm Hg. - Atrial septum: No defect or patent foramen ovale was identified. - Tricuspid valve: There was trivial regurgitation. - Pulmonary arteries: PA peak pressure: 20 mm Hg (S). - Pericardium, extracardiac: There was no pericardial effusion.  Holtermonitor 05/03/2018: Available strips from 24-hour Holter monitor reviewed. Sinus rhythm is documented throughout. There are rare to occasional PACs and PVCs with some ventricular bigeminy. Brief bursts of atrial tachycardia noted, longest run 10  beats. Heart rate ranged from 41 bpm up to 94 bpm with average heart rate 52 bpm. No significant pauses were noted.  Labs/Other Tests and Data Reviewed:    EKG:  An ECG dated 10/11/2019 was personally reviewed today and demonstrated:  Sinus bradycardia with prolonged PR interval and IVCD.  Recent Labs:  January 2021: BUN 33, creatinine 0.99, potassium 4.28 July 2020: Hemoglobin A1c 6%  Recent Lipid Panel Lab Results  Component Value Date/Time   CHOL 104 03/06/2019 01:38 PM   TRIG 58 03/06/2019 01:38 PM   HDL 44 03/06/2019 01:38 PM   CHOLHDL 2.4 03/06/2019 01:38 PM   CHOLHDL 11.7 10/22/2011 10:30 AM   LDLCALC 48 03/06/2019 01:38 PM    Wt Readings from Last 3 Encounters:  09/11/20 272 lb (123.4 kg)  08/28/20 281 lb 12.8 oz (127.8 kg)  08/19/20 276 lb (125.2 kg)     Objective:    Vital Signs:  BP 136/61    Pulse (!) 51   Ht 6\' 1"  (1.854 m)   Wt 272 lb (123.4 kg)   BMI 35.89 kg/m    Patient spoke in full sentences, not short of breath on the phone.  ASSESSMENT & PLAN:    1. History of palpitations with documented PACs and PVCs as well as brief bursts of atrial tachycardia by cardiac monitor. He is bradycardic on low-dose Coreg which we will continue at this time, realizing that beta-blocker may need to be discontinued if he has any clear evidence of symptomatic bradycardia.  2. History of nonischemic cardiomyopathy with improvement in LVEF on medical therapy. His last echocardiogram in 2019 revealed LVEF 50 to 55%. Continue Coreg, lisinopril, Lasix, and potassium supplement.   Time:   Today, I have spent 10 minutes with the patient with telehealth technology discussing the above problems.     Medication Adjustments/Labs and Tests Ordered: Current medicines are reviewed at length with the patient today.  Concerns regarding medicines are outlined above.   Tests Ordered: No orders of the defined types were placed in this encounter.   Medication Changes: No orders of the defined types were placed in this encounter.   Follow Up:  6 months in the [place] office.   Signed, [place], MD  09/11/2020 8:54 AM    [facility]

## 2020-09-11 ENCOUNTER — Telehealth (INDEPENDENT_AMBULATORY_CARE_PROVIDER_SITE_OTHER): Payer: Medicare Other | Admitting: Cardiology

## 2020-09-11 ENCOUNTER — Encounter: Payer: Self-pay | Admitting: Cardiology

## 2020-09-11 ENCOUNTER — Other Ambulatory Visit: Payer: Self-pay | Admitting: Cardiology

## 2020-09-11 VITALS — BP 136/61 | HR 51 | Ht 73.0 in | Wt 272.0 lb

## 2020-09-11 DIAGNOSIS — R002 Palpitations: Secondary | ICD-10-CM | POA: Diagnosis not present

## 2020-09-11 DIAGNOSIS — Z8679 Personal history of other diseases of the circulatory system: Secondary | ICD-10-CM | POA: Diagnosis not present

## 2020-09-11 NOTE — Patient Instructions (Addendum)

## 2020-09-17 DIAGNOSIS — L97521 Non-pressure chronic ulcer of other part of left foot limited to breakdown of skin: Secondary | ICD-10-CM | POA: Diagnosis not present

## 2020-09-24 DIAGNOSIS — L97521 Non-pressure chronic ulcer of other part of left foot limited to breakdown of skin: Secondary | ICD-10-CM | POA: Diagnosis not present

## 2020-09-29 DIAGNOSIS — L97519 Non-pressure chronic ulcer of other part of right foot with unspecified severity: Secondary | ICD-10-CM | POA: Diagnosis not present

## 2020-09-29 DIAGNOSIS — M869 Osteomyelitis, unspecified: Secondary | ICD-10-CM | POA: Diagnosis not present

## 2020-09-29 DIAGNOSIS — M25474 Effusion, right foot: Secondary | ICD-10-CM | POA: Diagnosis not present

## 2020-09-30 ENCOUNTER — Other Ambulatory Visit: Payer: Medicare Other

## 2020-09-30 DIAGNOSIS — Z20822 Contact with and (suspected) exposure to covid-19: Secondary | ICD-10-CM | POA: Diagnosis not present

## 2020-10-01 LAB — NOVEL CORONAVIRUS, NAA: SARS-CoV-2, NAA: NOT DETECTED

## 2020-10-01 LAB — SARS-COV-2, NAA 2 DAY TAT

## 2020-10-05 DIAGNOSIS — Z01812 Encounter for preprocedural laboratory examination: Secondary | ICD-10-CM | POA: Diagnosis not present

## 2020-10-07 ENCOUNTER — Other Ambulatory Visit: Payer: Self-pay | Admitting: Family Medicine

## 2020-10-07 DIAGNOSIS — M868X7 Other osteomyelitis, ankle and foot: Secondary | ICD-10-CM | POA: Diagnosis not present

## 2020-10-07 DIAGNOSIS — M86171 Other acute osteomyelitis, right ankle and foot: Secondary | ICD-10-CM | POA: Diagnosis not present

## 2020-11-06 ENCOUNTER — Other Ambulatory Visit: Payer: Self-pay | Admitting: Family Medicine

## 2020-11-06 DIAGNOSIS — E782 Mixed hyperlipidemia: Secondary | ICD-10-CM

## 2020-11-06 DIAGNOSIS — N400 Enlarged prostate without lower urinary tract symptoms: Secondary | ICD-10-CM

## 2020-11-06 DIAGNOSIS — M1A00X0 Idiopathic chronic gout, unspecified site, without tophus (tophi): Secondary | ICD-10-CM

## 2020-11-17 ENCOUNTER — Ambulatory Visit: Payer: Medicare Other | Admitting: Family Medicine

## 2020-11-22 ENCOUNTER — Other Ambulatory Visit: Payer: Self-pay | Admitting: Family Medicine

## 2020-11-22 DIAGNOSIS — I10 Essential (primary) hypertension: Secondary | ICD-10-CM

## 2020-11-29 ENCOUNTER — Other Ambulatory Visit: Payer: Self-pay | Admitting: Family Medicine

## 2020-12-08 DIAGNOSIS — E1142 Type 2 diabetes mellitus with diabetic polyneuropathy: Secondary | ICD-10-CM | POA: Diagnosis not present

## 2020-12-08 DIAGNOSIS — B351 Tinea unguium: Secondary | ICD-10-CM | POA: Diagnosis not present

## 2020-12-08 DIAGNOSIS — L84 Corns and callosities: Secondary | ICD-10-CM | POA: Diagnosis not present

## 2020-12-08 DIAGNOSIS — M79676 Pain in unspecified toe(s): Secondary | ICD-10-CM | POA: Diagnosis not present

## 2020-12-10 ENCOUNTER — Ambulatory Visit (INDEPENDENT_AMBULATORY_CARE_PROVIDER_SITE_OTHER): Payer: Medicare Other | Admitting: Family Medicine

## 2020-12-10 DIAGNOSIS — E1122 Type 2 diabetes mellitus with diabetic chronic kidney disease: Secondary | ICD-10-CM

## 2020-12-10 DIAGNOSIS — I10 Essential (primary) hypertension: Secondary | ICD-10-CM | POA: Diagnosis not present

## 2020-12-10 DIAGNOSIS — I509 Heart failure, unspecified: Secondary | ICD-10-CM | POA: Diagnosis not present

## 2020-12-10 DIAGNOSIS — J449 Chronic obstructive pulmonary disease, unspecified: Secondary | ICD-10-CM

## 2020-12-10 DIAGNOSIS — N1832 Chronic kidney disease, stage 3b: Secondary | ICD-10-CM

## 2020-12-10 MED ORDER — CARVEDILOL 6.25 MG PO TABS: 3.1250 mg | ORAL_TABLET | Freq: Two times a day (BID) | ORAL | 1 refills | Status: DC

## 2020-12-10 NOTE — Progress Notes (Signed)
Subjective:    Patient ID: Mitchell Jarred., male    DOB: 15-Nov-1951, 69 y.o.   MRN: [id number]   HPI: Mitchell Miklas. is a 69 y.o. male presenting for presents forFollow-up of diabetes. Patient checks blood sugar at home.   90s fasting and 90s postprandial Patient denies symptoms such as polyuria, polydipsia, excessive hunger, nausea No significant hypoglycemic spells noted. Medications reviewed. Pt reports taking them regularly without complication/adverse reaction being reported today.   No dyspnea. Some chronic right leg  edema. [facility].    presents for  follow-up of hypertension. Patient has no history of headache chest pain or shortness of breath or recent cough. Patient also denies symptoms of TIA such as focal numbness or weakness. Patient denies side effects from medication. States taking it regularly. BP 130s -140s/ 60s on home checks  Had right first toe amputated due to osteomyelitis. Released by Dr. Ulice Parker 2 days ago.    Relevant past medical, surgical, family and social history reviewed and updated as indicated.  Interim medical history since our last visit reviewed. Allergies and medications reviewed and updated.  ROS:  Review of Systems  Constitutional: Negative for fever.  Respiratory: Negative for shortness of breath.   Cardiovascular: Negative for chest pain.  Musculoskeletal: Negative for arthralgias.  Skin: Negative for rash.     Social History   Tobacco Use  Smoking Status Former Smoker  . Types: Cigarettes  . Quit date: 12/26/1994  . Years since quitting: 25.9  Smokeless Tobacco Never Used       Objective:     Wt Readings from Last 3 Encounters:  09/11/20 272 lb (123.4 kg)  08/28/20 281 lb 12.8 oz (127.8 kg)  08/19/20 276 lb (125.2 kg)     Exam deferred. Pt. Harboring due to COVID 19. Phone visit performed.   Assessment & Plan:   1. Chronic obstructive pulmonary disease, unspecified COPD type (HCC)    2. Chronic congestive heart failure, unspecified heart failure type (HCC)   3. Primary hypertension   4. Type 2 diabetes mellitus with stage 3b chronic kidney disease, without long-term current use of insulin (HCC)     Meds ordered this encounter  Medications  . carvedilol (COREG) 6.25 MG tablet    Sig: Take 0.5 tablets (3.125 mg total) by mouth 2 (two) times daily.    Dispense:  180 tablet    Refill:  1    Requesting 1 year supply    No orders of the defined types were placed in this encounter.     Diagnoses and all orders for this visit:  Chronic obstructive pulmonary disease, unspecified COPD type (HCC)  Chronic congestive heart failure, unspecified heart failure type (HCC) -     carvedilol (COREG) 6.25 MG tablet; Take 0.5 tablets (3.125 mg total) by mouth 2 (two) times daily.  Primary hypertension -     carvedilol (COREG) 6.25 MG tablet; Take 0.5 tablets (3.125 mg total) by mouth 2 (two) times daily.  Type 2 diabetes mellitus with stage 3b chronic kidney disease, without long-term current use of insulin (HCC) -     carvedilol (COREG) 6.25 MG tablet; Take 0.5 tablets (3.125 mg total) by mouth 2 (two) times daily.    Virtual Visit via telephone Note  I discussed the limitations, risks, security and privacy concerns of performing an evaluation and management service by telephone and the availability of in person appointments. The patient was identified with two  identifiers. Pt.expressed understanding and agreed to proceed. Pt. Is at home. Dr. Darlyn Parker is in his office.  Follow Up Instructions:   I discussed the assessment and treatment plan with the patient. The patient was provided an opportunity to ask questions and all were answered. The patient agreed with the plan and demonstrated an understanding of the instructions.   The patient was advised to call back or seek an in-person evaluation if the symptoms worsen or if the condition fails to improve as  anticipated.   Total minutes including chart review and phone contact time: 21   Follow up plan: Return in about 3 months (around 03/10/2021).  [name], MD [place] [facility]

## 2020-12-17 ENCOUNTER — Encounter: Payer: Self-pay | Admitting: Family Medicine

## 2020-12-31 ENCOUNTER — Other Ambulatory Visit: Payer: Self-pay | Admitting: Family Medicine

## 2021-01-18 ENCOUNTER — Other Ambulatory Visit: Payer: Self-pay | Admitting: Family Medicine

## 2021-01-18 ENCOUNTER — Telehealth: Payer: Self-pay

## 2021-01-18 NOTE — Telephone Encounter (Signed)
Pt states he states he takes this for his arthritis It is a historical medication. Last OV 12/10/20 Next OV not sched Please advise

## 2021-01-18 NOTE — Telephone Encounter (Signed)
  Prescription Request  01/18/2021  What is the name of the medication or equipment? Hydroxyclororquine 300mg   Have you contacted your pharmacy to request a refill? (if applicable) yes  Which pharmacy would you like this sent to? [facility]   Patient notified that their request is being sent to the clinical staff for review and that they should receive a response within 2 business days.   [name]  Why was it denied? He was just seen.  Please call pt.

## 2021-01-19 NOTE — Telephone Encounter (Signed)
We could discuss that at an office visit

## 2021-01-19 NOTE — Telephone Encounter (Signed)
Left message that pt NTBS to discuss this medication

## 2021-01-26 ENCOUNTER — Other Ambulatory Visit: Payer: Self-pay | Admitting: Family Medicine

## 2021-01-26 DIAGNOSIS — E782 Mixed hyperlipidemia: Secondary | ICD-10-CM

## 2021-01-26 DIAGNOSIS — N400 Enlarged prostate without lower urinary tract symptoms: Secondary | ICD-10-CM

## 2021-01-26 DIAGNOSIS — M1A00X0 Idiopathic chronic gout, unspecified site, without tophus (tophi): Secondary | ICD-10-CM

## 2021-01-28 ENCOUNTER — Other Ambulatory Visit: Payer: Self-pay | Admitting: Family Medicine

## 2021-01-28 ENCOUNTER — Other Ambulatory Visit: Payer: Self-pay | Admitting: Cardiology

## 2021-01-28 DIAGNOSIS — I10 Essential (primary) hypertension: Secondary | ICD-10-CM

## 2021-01-29 ENCOUNTER — Other Ambulatory Visit: Payer: Self-pay | Admitting: Family Medicine

## 2021-02-11 ENCOUNTER — Emergency Department (HOSPITAL_COMMUNITY): Payer: No Typology Code available for payment source

## 2021-02-11 ENCOUNTER — Other Ambulatory Visit: Payer: Self-pay

## 2021-02-11 ENCOUNTER — Emergency Department (HOSPITAL_COMMUNITY)
Admission: EM | Admit: 2021-02-11 | Discharge: 2021-02-11 | Disposition: A | Discharge status: Home / Self Care | Payer: No Typology Code available for payment source | Attending: Emergency Medicine | Admitting: Emergency Medicine

## 2021-02-11 DIAGNOSIS — Z86711 Personal history of pulmonary embolism: Secondary | ICD-10-CM | POA: Insufficient documentation

## 2021-02-11 DIAGNOSIS — Z79899 Other long term (current) drug therapy: Secondary | ICD-10-CM | POA: Diagnosis not present

## 2021-02-11 DIAGNOSIS — Z87891 Personal history of nicotine dependence: Secondary | ICD-10-CM | POA: Diagnosis not present

## 2021-02-11 DIAGNOSIS — N183 Chronic kidney disease, stage 3 unspecified: Secondary | ICD-10-CM | POA: Insufficient documentation

## 2021-02-11 DIAGNOSIS — I130 Hypertensive heart and chronic kidney disease with heart failure and stage 1 through stage 4 chronic kidney disease, or unspecified chronic kidney disease: Secondary | ICD-10-CM | POA: Insufficient documentation

## 2021-02-11 DIAGNOSIS — I5032 Chronic diastolic (congestive) heart failure: Secondary | ICD-10-CM | POA: Insufficient documentation

## 2021-02-11 DIAGNOSIS — E1122 Type 2 diabetes mellitus with diabetic chronic kidney disease: Secondary | ICD-10-CM | POA: Insufficient documentation

## 2021-02-11 DIAGNOSIS — J449 Chronic obstructive pulmonary disease, unspecified: Secondary | ICD-10-CM | POA: Insufficient documentation

## 2021-02-11 DIAGNOSIS — D649 Anemia, unspecified: Secondary | ICD-10-CM | POA: Insufficient documentation

## 2021-02-11 DIAGNOSIS — M7989 Other specified soft tissue disorders: Secondary | ICD-10-CM

## 2021-02-11 DIAGNOSIS — R600 Localized edema: Secondary | ICD-10-CM | POA: Diagnosis not present

## 2021-02-11 DIAGNOSIS — R2241 Localized swelling, mass and lump, right lower limb: Secondary | ICD-10-CM | POA: Diagnosis present

## 2021-02-11 LAB — CBC WITH DIFFERENTIAL/PLATELET
Abs Immature Granulocytes: 0.01 10*3/uL (ref 0.00–0.07)
Basophils Absolute: 0 10*3/uL (ref 0.0–0.1)
Basophils Relative: 1 %
Eosinophils Absolute: 0.2 10*3/uL (ref 0.0–0.5)
Eosinophils Relative: 3 %
HCT: 33 % — ABNORMAL LOW (ref 39.0–52.0)
Hemoglobin: 10.4 g/dL — ABNORMAL LOW (ref 13.0–17.0)
Immature Granulocytes: 0 %
Lymphocytes Relative: 6 %
Lymphs Abs: 0.3 10*3/uL — ABNORMAL LOW (ref 0.7–4.0)
MCH: 27.8 pg (ref 26.0–34.0)
MCHC: 31.5 g/dL (ref 30.0–36.0)
MCV: 88.2 fL (ref 80.0–100.0)
Monocytes Absolute: 0.5 10*3/uL (ref 0.1–1.0)
Monocytes Relative: 10 %
Neutro Abs: 4.4 10*3/uL (ref 1.7–7.7)
Neutrophils Relative %: 80 %
Platelets: 151 10*3/uL (ref 150–400)
RBC: 3.74 MIL/uL — ABNORMAL LOW (ref 4.22–5.81)
RDW: 17.8 % — ABNORMAL HIGH (ref 11.5–15.5)
WBC: 5.4 10*3/uL (ref 4.0–10.5)
nRBC: 0 % (ref 0.0–0.2)

## 2021-02-11 LAB — COMPREHENSIVE METABOLIC PANEL
ALT: 31 U/L (ref 0–44)
AST: 36 U/L (ref 15–41)
Albumin: 3.4 g/dL — ABNORMAL LOW (ref 3.5–5.0)
Alkaline Phosphatase: 61 U/L (ref 38–126)
Anion gap: 5 (ref 5–15)
BUN: 38 mg/dL — ABNORMAL HIGH (ref 8–23)
CO2: 25 mmol/L (ref 22–32)
Calcium: 8.4 mg/dL — ABNORMAL LOW (ref 8.9–10.3)
Chloride: 105 mmol/L (ref 98–111)
Creatinine, Ser: 1.14 mg/dL (ref 0.61–1.24)
GFR, Estimated: >60 mL/min (ref >60)
Glucose, Bld: 127 mg/dL — ABNORMAL HIGH (ref 70–99)
Potassium: 3.9 mmol/L (ref 3.5–5.1)
Sodium: 135 mmol/L (ref 135–145)
Total Bilirubin: 0.8 mg/dL (ref 0.3–1.2)
Total Protein: 6.4 g/dL — ABNORMAL LOW (ref 6.5–8.1)

## 2021-02-11 MED ORDER — AMOXICILLIN-POT CLAVULANATE 875-125 MG PO TABS: 1.0000 | ORAL_TABLET | Freq: Two times a day (BID) | ORAL | 0 refills | Status: DC

## 2021-02-11 NOTE — ED Provider Notes (Signed)
[facility] Provider Note   CSN: [id number] Arrival date & time: 02/11/21  4098     History Chief Complaint  Patient presents with  . Leg Swelling    [facility]. is a 70 y.o. male.  HPI   He presents for evaluation of right lower leg swelling, without known trauma.  It started several days ago.  He contacted his PCP, via video chat and was instructed to come here for further evaluation.  He denies associated shortness of breath, cough, chest pain, weakness or dizziness.  He reports prior remote difficulty with right lower leg, when he had to have a wrap placed on it for prolonged period of time and has subsequently had intermittent swelling in the leg.  There are no other known modifying factors.  Past Medical History:  Diagnosis Date  . Cholelithiasis with cholecystitis 01/04/2012  . Chronic diarrhea 04/17/2012  . CKD (chronic kidney disease) stage 3, GFR 30-59 ml/min (HCC)   . COPD (chronic obstructive pulmonary disease) (HCC)   . DVT (deep venous thrombosis) (HCC) 04/19/2012  . Essential hypertension   . Gallstones   . Gout   . History of colonic polyps 04/10/2017  . History of DVT (deep vein thrombosis)   . Kidney stones   . Lumbar disc disease   . Neuropathy   . Nonischemic cardiomyopathy (HCC)    Minor coronary atherosclerosis at cardiac catheterization 2003, LVEF initially 20% with normalization  . Obesity   . Orthostatic hypotension 09/09/2018  . RA (rheumatoid arthritis) (HCC)   . Sleep apnea   . Subarachnoid hemorrhage (HCC) 09/07/2018  . Subdural hematoma (HCC) 09/08/2018  . Syncope, vasovagal 09/07/2018  . Type 2 diabetes mellitus [facility])     Patient Active Problem List   Diagnosis Date Noted  . Venous insufficiency of right leg 08/19/2020  . Thrombocytopenia (HCC) 09/08/2018  . Chronic diastolic congestive heart failure (HCC) 08/17/2017  . Hypokalemia 05/23/2012  . 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 Pulmonary embolism (HCC) 04/19/2012   . Chronic renal insufficiency 04/17/2012  . Obesity, morbid (HCC) 01/16/2012  . CHF (congestive heart failure) (HCC) 01/16/2012  . COPD (chronic obstructive pulmonary disease) (HCC) 01/16/2012  . OSA on CPAP 01/16/2012  . Gout 01/04/2012  . Hypertension 01/04/2012    Past Surgical History:  Procedure Laterality Date  . BARIATRIC SURGERY  01/11/2018  . CHOLECYSTECTOMY  01/18/2012   Procedure: LAPAROSCOPIC CHOLECYSTECTOMY WITH INTRAOPERATIVE CHOLANGIOGRAM;  Surgeon: Maisie Fus A. Cornett, MD;  Location: [facility];  Service: General;  Laterality: N/A;  . EYE SURGERY     Film over retina was removed.  [name] PILONIDAL CYST EXCISION  1997  . UMBILICAL HERNIA REPAIR    . VARICOSE VEIN SURGERY     left leg       Family History  Problem Relation Age of Onset  . Hypertension Mother   . Stroke Mother   . Diabetes Father   . Hypertension Father   . Heart disease Father   . Diabetes Brother   . Colon cancer Neg Hx     Social History   Tobacco Use  . Smoking status: Former Smoker    Types: Cigarettes    Quit date: 12/26/1994    Years since quitting: 26.1  . Smokeless tobacco: Never Used  Vaping Use  . Vaping Use: Never used  Substance Use Topics  . Alcohol use: No  . Drug use: No    Home Medications Prior to Admission medications   Medication Sig Start  Date End Date Taking? Authorizing Provider  amoxicillin-clavulanate (AUGMENTIN) 875-125 MG tablet Take 1 tablet by mouth 2 (two) times daily. One po bid x 7 days 02/11/21  Yes Mancel Bale, MD  acetaminophen (TYLENOL) 500 MG tablet Take 500 mg by mouth every 6 (six) hours as needed.    [provider]  albuterol-ipratropium (COMBIVENT) 18-103 MCG/ACT inhaler Inhale 2 puffs into the lungs every 6 (six) hours as needed. Wheezing      [provider]  allopurinol (ZYLOPRIM) 300 MG tablet TAKE 1 TABLET BY MOUTH IN  THE MORNING 01/27/21   [name], MD  amLODipine (NORVASC) 5 MG tablet TAKE 1 TABLET BY MOUTH  DAILY FOR  BLOOD PRESSURE 01/29/21   [name], MD  atorvastatin (LIPITOR) 80 MG tablet TAKE 1 TABLET BY MOUTH  DAILY AT 6 PM 01/27/21   [name], MD  calcitRIOL (ROCALTROL) 0.25 MCG capsule TAKE 1 CAPSULE BY MOUTH  DAILY WITH BREAKFAST 01/29/21   [name], MD  calcium carbonate (OSCAL) 1500 (600 Ca) MG TABS tablet Take 600 mg of elemental calcium by mouth 2 (two) times daily with a meal.    [provider]  carvedilol (COREG) 6.25 MG tablet Take 0.5 tablets (3.125 mg total) by mouth 2 (two) times daily. 12/10/20   [name], MD  celecoxib (CELEBREX) 200 MG capsule TAKE 1 CAPSULE BY MOUTH  DAILY WITH FOOD 01/01/21   [name], MD  Colchicine 0.6 MG CAPS TAKE 1 CAPSULE BY MOUTH  DAILY 08/19/20   [name], MD  DULoxetine (CYMBALTA) 30 MG capsule TAKE 1 CAPSULE BY MOUTH  TWICE DAILY 01/27/21   [name], MD  fluticasone (FLONASE) 50 MCG/ACT nasal spray USE 2 SPRAYS IN BOTH  NOSTRILS DAILY AS NEEDED  FOR ALLERGIES OR RHINITIS 02/01/21   [name], MD  furosemide (LASIX) 40 MG tablet TAKE 1 TABLET BY MOUTH  DAILY IN THE MORNING 01/27/21   [name], MD  hydroxychloroquine (PLAQUENIL) 200 MG tablet Take 1 tablet by mouth in the morning and at bedtime. 10/23/20   [provider]  lisinopril (ZESTRIL) 20 MG tablet TAKE 1 TABLET BY MOUTH  DAILY 01/29/21   Jonelle Sidle, MD  mometasone (ELOCON) 0.1 % cream Apply topically daily. 09/03/19   [name], MD  Multiple Vitamin (MULTIVITAMIN) capsule Take 1 capsule by mouth daily.    [provider]  neomycin-polymyxin-hydrocortisone (CORTISPORIN) 3.5-10000-1 OTIC suspension INSTILL 4 DROP INTO THE  AFFECTED EARS TWICE DAILY  FOR 10 DAYS 01/19/21   [name], MD  pantoprazole (PROTONIX) 40 MG tablet TAKE 1 TABLET BY MOUTH  DAILY 01/01/21   [name], MD  potassium chloride SA (KLOR-CON) 20 MEQ tablet TAKE 1 TABLET BY MOUTH  DAILY FOR POTASSIUM  REPLACEMENT/ SUPPLEMENT 01/01/21   [name], MD  silver  sulfADIAZINE (SILVADENE) 1 % cream Apply 1 application topically daily.    [provider]  tamsulosin (FLOMAX) 0.4 MG CAPS capsule TAKE 1 CAPSULE BY MOUTH AT  BEDTIME 01/27/21   [name], MD  vitamin B-12 (CYANOCOBALAMIN) 500 MCG tablet Take 500 mcg by mouth daily.    [provider]    Allergies    Ciprofloxacin, Definity [perflutren lipid microsphere], and Levofloxacin [levofloxacin]  Review of Systems   Review of Systems  All other systems reviewed and are negative.   Physical Exam Updated Vital Signs BP (!) 179/64   Pulse (!) 47   Temp 98.6 F (37 C) (Oral)   Resp 18  Ht 6\' 1"  (1.854 m)   Wt 127 kg   SpO2 99%   BMI 36.94 kg/m   Physical Exam Vitals and nursing note reviewed.  Constitutional:      General: He is not in acute distress.    Appearance: He is well-developed and well-nourished. He is obese. He is diaphoretic. He is not ill-appearing or toxic-appearing.  HENT:     Head: Normocephalic and atraumatic.     Right Ear: External ear normal.     Left Ear: External ear normal.  Eyes:     Extraocular Movements: EOM normal.     Conjunctiva/sclera: Conjunctivae normal.     Pupils: Pupils are equal, round, and reactive to light.  Neck:     Trachea: Phonation normal.  Cardiovascular:     Rate and Rhythm: Normal rate.  Pulmonary:     Effort: Pulmonary effort is normal.  Chest:     Chest wall: No bony tenderness.  Abdominal:     General: There is no distension.  Musculoskeletal:        General: Normal range of motion.     Cervical back: Normal range of motion and neck supple.     Comments: Right lower leg swollen, as compared to left. Mild erythema of the right lower leg with chronic appearing changes of the skin.  Skin:    General: Skin is warm and intact.  Neurological:     Mental Status: He is alert and oriented to person, place, and time.     Cranial Nerves: No cranial nerve deficit.     Sensory: No sensory deficit.     Motor: No  abnormal muscle tone.     Coordination: Coordination normal.  Psychiatric:        Mood and Affect: Mood and affect and mood normal.        Behavior: Behavior normal.        Thought Content: Thought content normal.        Judgment: Judgment normal.     ED Results / Procedures / Treatments   Labs (all labs ordered are listed, but only abnormal results are displayed) Labs Reviewed  CBC WITH DIFFERENTIAL/PLATELET - Abnormal; Notable for the following components:      Result Value   RBC 3.74 (*)    Hemoglobin 10.4 (*)    HCT 33.0 (*)    RDW 17.8 (*)    Lymphs Abs 0.3 (*)    All other components within normal limits  COMPREHENSIVE METABOLIC PANEL - Abnormal; Notable for the following components:   Glucose, Bld 127 (*)    BUN 38 (*)    Calcium 8.4 (*)    Total Protein 6.4 (*)    Albumin 3.4 (*)    All other components within normal limits    EKG None  Radiology US Venous Img Lower Right (DVT Study)  Result Date: 02/11/2021 CLINICAL DATA:  Right lower extremity edema. EXAM: RIGHT LOWER EXTREMITY VENOUS DOPPLER ULTRASOUND TECHNIQUE: Gray-scale sonography with compression, as well as color and duplex ultrasound, were performed to evaluate the deep venous system(s) from the level of the common femoral vein through the popliteal and proximal calf veins. COMPARISON:  No recent prior. FINDINGS: VENOUS Normal compressibility of the common femoral, superficial femoral, and popliteal veins. Limited visualization of calf veins due to body habitus. Visualized calf veins appear normal. Visualized portions of profunda femoral vein and great saphenous vein unremarkable. No filling defects to suggest DVT on grayscale or color Doppler imaging. Doppler waveforms  show normal direction of venous flow, normal respiratory plasticity and response to augmentation. Limited views of the contralateral common femoral vein are unremarkable. OTHER Soft tissue swelling. IMPRESSION: No evidence of DVT. Limited  visualization of right lower extremity calf veins due to patient's body habitus. Visualized calf veins appear normal. Electronically Signed   By: Maisie Fus  Register   On: 02/11/2021 09:24    Procedures Procedures   Medications Ordered in ED Medications - No data to display  ED Course  I have reviewed the triage vital signs and the nursing notes.  Pertinent labs & imaging results that were available during my care of the patient were reviewed by me and considered in my medical decision making (see chart for details).    MDM Rules/Calculators/A&P                           Patient Vitals for the past 24 hrs:  BP Temp Temp src Pulse Resp SpO2 Height Weight  02/11/21 1130 (!) 179/64 -- -- (!) 47 18 99 % -- --  02/11/21 1100 (!) 174/60 -- -- (!) 47 -- 98 % -- --  02/11/21 1030 (!) 169/65 -- -- (!) 48 -- 97 % -- --  02/11/21 1007 (!) 173/63 -- -- (!) 50 18 99 % -- --  02/11/21 1000 (!) 173/63 -- -- (!) 48 -- 99 % -- --  02/11/21 0945 -- -- -- (!) 50 -- 100 % -- --  02/11/21 0930 (!) 166/61 -- -- (!) 47 -- 100 % -- --  02/11/21 0915 -- -- -- (!) 50 -- 100 % -- --  02/11/21 0904 (!) 170/66 -- -- (!) 50 18 100 % -- --  02/11/21 0900 (!) 170/66 -- -- (!) 48 -- 100 % -- --  02/11/21 0845 -- -- -- (!) 50 -- 99 % -- --  02/11/21 0830 (!) 178/62 -- -- (!) 49 -- 97 % -- --  02/11/21 0815 -- -- -- (!) 50 -- 97 % -- --  02/11/21 0800 (!) 164/64 -- -- (!) 54 18 95 % -- --  02/11/21 0757 (!) 162/63 -- -- (!) 55 20 97 % -- --  02/11/21 0745 -- -- -- (!) 57 -- 99 % -- --  02/11/21 0730 (!) 155/64 -- -- (!) 58 -- 97 % -- --  02/11/21 0715 -- -- -- 60 -- 97 % -- --  02/11/21 0700 (!) 141/58 -- -- (!) 56 -- 98 % -- --  02/11/21 0645 -- -- -- (!) 57 -- 100 % -- --  02/11/21 0635 (!) 157/60 98.6 F (37 C) Oral 62 17 100 % -- --  02/11/21 1610 -- -- -- -- -- --  (1.854 m) 127 kg  02/11/21 0630 (!) 157/60 -- -- 68 -- 100 % -- --    11:41 AM Reevaluation with update and discussion. After initial  assessment and treatment, an updated evaluation reveals he remains comfortable has no additional complaints. He denies shortness of breath or chest pain at this time. Findings discussed with the patient and all questions were answered. Mancel Bale   Medical Decision Making:  This patient is presenting for evaluation of atraumatic right lower leg swelling, recurrent, which does require a range of treatment options, and is a complaint that involves a moderate risk of morbidity and mortality. The differential diagnoses include cellulitis, DVT, chronic venous insufficiency. I decided to review old records, and in summary elderly male  presenting with lateral leg swelling.  I did not require additional historical information from anyone.  Clinical Laboratory Tests Ordered, included CBC and Metabolic panel. Review indicates glucose high, BUN high, calcium low, total protein low, albumin low, hemoglobin low. Radiologic Tests Ordered, included venous Doppler right lower leg.  I independently Visualized: Radiographic images, which show no DVT    Critical Interventions-clinical evaluation, laboratory testing Doppler imaging and reevaluation  After These Interventions, the Patient was reevaluated and was found stable for discharge.  Unclear cause for right lower leg swelling, likely chronic venous insufficiency.  Consideration for early cellulitis, so he will be covered with an antibiotic, instructed on leg elevation and wound care.  Incidental mild anemia present.  Patient is not symptomatic.  These problems can be revaluated by his primary care doctor next week during a routine examination.  CRITICAL CARE-no Performed by: Mancel Bale   Nursing Notes Reviewed/ Care Coordinated Applicable Imaging Reviewed Interpretation of Laboratory Data incorporated into ED treatment  The patient appears reasonably screened and/or stabilized for discharge and I doubt any other medical condition or other [facility]  requiring further screening, evaluation, or treatment in the ED at this time prior to discharge.  Plan: Home Medications-continue usual; Home Treatments-elevate legs; return here if the recommended treatment, does not improve the symptoms; Recommended follow up-PCP follow-up 1 week.       Final Clinical Impression(s) / ED Diagnoses Final diagnoses:  Right leg swelling  Anemia, unspecified type    Rx / DC Orders ED Discharge Orders         Ordered    amoxicillin-clavulanate (AUGMENTIN) 875-125 MG tablet  2 times daily        02/11/21 1142           Mancel Bale, MD 02/12/21 1110

## 2021-02-11 NOTE — ED Triage Notes (Addendum)
Pt c/o of right leg swelling with redness, pt states he has a hx of DVT.

## 2021-02-11 NOTE — Discharge Instructions (Addendum)
It is not clear what is causing the swelling of your right lower leg. We are giving you an antibiotic prescription in case it is caused by a bacterial infection. To help the swelling, elevate your legs above your heart as much as possible. Also use a warm compress or heating pad on the swollen and discolored area, several times a day to improve the swelling. Return here if needed for problems. Otherwise see your PCP or the doctor at the [place], next Monday or Tuesday for a checkup.

## 2021-02-11 NOTE — ED Notes (Signed)
US completed at bedside.

## 2021-03-16 NOTE — Progress Notes (Signed)
Cardiology Office Note  Date: 03/17/2021   ID: [name], DOB 01-07-1951, MRN [id number]  PCP:  [name], MD  Cardiologist:  [name], MD Electrophysiologist:  None   Chief Complaint  Patient presents with  . Cardiac follow-up    History of Present Illness: [facility]. is a 70 y.o. male last assessed via telehealth encounter in September 2021.  He presents for a routine follow-up visit.  Reports no significant palpitations or progressive shortness of breath with typical ADLs.  I reviewed his medications which are stable from a cardiac perspective and outlined below.  ECG today shows sinus bradycardia with IVCD, QRS duration 128 ms.  He has had no dizziness or syncope.  Last echocardiogram was in 2019 at which point LVEF had improved to the range of 50 to 55%.  We have discussed obtaining an updated study to ensure stability.  Past Medical History:  Diagnosis Date  . Cholelithiasis with cholecystitis 01/04/2012  . Chronic diarrhea 04/17/2012  . CKD (chronic kidney disease) stage 3, GFR 30-59 ml/min (HCC)   . COPD (chronic obstructive pulmonary disease) (HCC)   . DVT (deep venous thrombosis) (HCC) 04/19/2012  . Essential hypertension   . Gallstones   . Gout   . History of colonic polyps 04/10/2017  . History of DVT (deep vein thrombosis)   . Kidney stones   . Lumbar disc disease   . Neuropathy   . Nonischemic cardiomyopathy (HCC)    Minor coronary atherosclerosis at cardiac catheterization 2003, LVEF initially 20% with normalization  . Obesity   . Orthostatic hypotension 09/09/2018  . RA (rheumatoid arthritis) (HCC)   . Sleep apnea   . Subarachnoid hemorrhage (HCC) 09/07/2018  . Subdural hematoma (HCC) 09/08/2018  . Syncope, vasovagal 09/07/2018  . Type 2 diabetes mellitus (HCC)     Past Surgical History:  Procedure Laterality Date  . BARIATRIC SURGERY  01/11/2018  . CHOLECYSTECTOMY  01/18/2012   Procedure: LAPAROSCOPIC CHOLECYSTECTOMY WITH  INTRAOPERATIVE CHOLANGIOGRAM;  Surgeon: Maisie Fus A. Cornett, MD;  Location: [facility];  Service: General;  Laterality: N/A;  . EYE SURGERY     Film over retina was removed.  [name] PILONIDAL CYST EXCISION  1997  . UMBILICAL HERNIA REPAIR    . VARICOSE VEIN SURGERY     left leg    Current Outpatient Medications  Medication Sig Dispense Refill  . acetaminophen (TYLENOL) 500 MG tablet Take 500 mg by mouth every 6 (six) hours as needed.    [name] albuterol-ipratropium (COMBIVENT) 18-103 MCG/ACT inhaler Inhale 2 puffs into the lungs every 6 (six) hours as needed. Wheezing      . allopurinol (ZYLOPRIM) 300 MG tablet TAKE 1 TABLET BY MOUTH IN  THE MORNING 90 tablet 0  . amLODipine (NORVASC) 5 MG tablet TAKE 1 TABLET BY MOUTH  DAILY FOR BLOOD PRESSURE 90 tablet 3  . atorvastatin (LIPITOR) 80 MG tablet TAKE 1 TABLET BY MOUTH  DAILY AT 6 PM 90 tablet 1  . calcitRIOL (ROCALTROL) 0.25 MCG capsule TAKE 1 CAPSULE BY MOUTH  DAILY WITH BREAKFAST 90 capsule 3  . calcium carbonate (OSCAL) 1500 (600 Ca) MG TABS tablet Take 600 mg of elemental calcium by mouth 2 (two) times daily with a meal.    . carvedilol (COREG) 6.25 MG tablet Take 0.5 tablets (3.125 mg total) by mouth 2 (two) times daily. 180 tablet 1  . celecoxib (CELEBREX) 200 MG capsule TAKE 1 CAPSULE BY MOUTH  DAILY WITH FOOD 90 capsule 3  .  Colchicine 0.6 MG CAPS TAKE 1 CAPSULE BY MOUTH  DAILY 90 capsule 3  . DULoxetine (CYMBALTA) 30 MG capsule TAKE 1 CAPSULE BY MOUTH  TWICE DAILY 180 capsule 0  . fluticasone (FLONASE) 50 MCG/ACT nasal spray USE 2 SPRAYS IN BOTH  NOSTRILS DAILY AS NEEDED  FOR ALLERGIES OR RHINITIS 48 g 2  . furosemide (LASIX) 40 MG tablet TAKE 1 TABLET BY MOUTH  DAILY IN THE MORNING 90 tablet 1  . hydroxychloroquine (PLAQUENIL) 200 MG tablet Take 1 tablet by mouth in the morning and at bedtime.    [name] lisinopril (ZESTRIL) 20 MG tablet TAKE 1 TABLET BY MOUTH  DAILY 90 tablet 1  . mometasone (ELOCON) 0.1 % cream Apply topically daily. 45 g 3  .  Multiple Vitamin (MULTIVITAMIN) capsule Take 1 capsule by mouth daily.    [name] neomycin-polymyxin-hydrocortisone (CORTISPORIN) 3.5-10000-1 OTIC suspension INSTILL 4 DROP INTO THE  AFFECTED EARS TWICE DAILY  FOR 10 DAYS 10 mL 0  . pantoprazole (PROTONIX) 40 MG tablet TAKE 1 TABLET BY MOUTH  DAILY 90 tablet 3  . potassium chloride SA (KLOR-CON) 20 MEQ tablet TAKE 1 TABLET BY MOUTH  DAILY FOR POTASSIUM  REPLACEMENT/ SUPPLEMENT 90 tablet 3  . silver sulfADIAZINE (SILVADENE) 1 % cream Apply 1 application topically daily.    . tamsulosin (FLOMAX) 0.4 MG CAPS capsule TAKE 1 CAPSULE BY MOUTH AT  BEDTIME 90 capsule 1  . vitamin B-12 (CYANOCOBALAMIN) 500 MCG tablet Take 500 mcg by mouth daily.     No current facility-administered medications for this visit.   Allergies:  Ciprofloxacin, Definity [perflutren lipid microsphere], and Levofloxacin [levofloxacin]   ROS: No syncope.  Neuropathy symptoms.  Physical Exam: VS:  BP 132/62 (BP Location: Right Arm, Patient Position: Sitting, Cuff Size: Normal)   Pulse (!) 55   Wt 287 lb 12.8 oz (130.5 kg)   SpO2 100%   BMI 37.97 kg/m , BMI Body mass index is 37.97 kg/m.  Wt Readings from Last 3 Encounters:  03/17/21 287 lb 12.8 oz (130.5 kg)  02/11/21 280 lb (127 kg)  09/11/20 272 lb (123.4 kg)    General: Patient appears comfortable at rest. HEENT: Conjunctiva and lids normal, wearing a mask. Neck: Supple, no elevated JVP or carotid bruits, no thyromegaly. Lungs: Clear to auscultation, nonlabored breathing at rest. Cardiac: Regular rate and rhythm, no S3 or significant systolic murmur, no pericardial rub. Abdomen: Soft, nontender, bowel sounds present. Extremities: Venous stasis and mild edema, distal pulses 2+.  ECG:  An ECG dated 10/11/2019 was personally reviewed today and demonstrated:  Sinus bradycardia with prolonged PR interval and IVCD.  Recent Labwork: 02/11/2021: ALT 31; AST 36; BUN 38; Creatinine, Ser 1.14; Hemoglobin 10.4; Platelets 151;  Potassium 3.9; Sodium 135     Component Value Date/Time   CHOL 104 03/06/2019 1338   TRIG 58 03/06/2019 1338   HDL 44 03/06/2019 1338   CHOLHDL 2.4 03/06/2019 1338   CHOLHDL 11.7 10/22/2011 1030   VLDL 39 10/22/2011 1030   LDLCALC 48 03/06/2019 1338    Other Studies Reviewed Today:  Echocardiogram 05/07/2018: Study Conclusions  - Left ventricle: The cavity size was at the upper limits of normal. Wall thickness was increased in a pattern of mild LVH. Systolic function was normal. The estimated ejection fraction was in the range of 50% to 55%. Diffuse hypokinesis. Left ventricular diastolic function parameters were normal for the patient&'s age. - Aortic valve: Trileaflet; mildly calcified leaflets. - Mitral valve: Mildly calcified annulus. There was trivial  regurgitation. - Right ventricle: The cavity size was mildly dilated. - Right atrium: Central venous pressure (est): 3 mm Hg. - Atrial septum: No defect or patent foramen ovale was identified. - Tricuspid valve: There was trivial regurgitation. - Pulmonary arteries: PA peak pressure: 20 mm Hg (S). - Pericardium, extracardiac: There was no pericardial effusion.  Holtermonitor 05/03/2018: Available strips from 24-hour Holter monitor reviewed. Sinus rhythm is documented throughout. There are rare to occasional PACs and PVCs with some ventricular bigeminy. Brief bursts of atrial tachycardia noted, longest run 10 beats. Heart rate ranged from 41 bpm up to 94 bpm with average heart rate 52 bpm. No significant pauses were noted.  Assessment and Plan:  1.  History of nonischemic cardiomyopathy, LVEF 50 to 55% by echocardiogram in 2019.  Updated study will be obtained.  Continue Coreg, lisinopril, Norvasc, Lasix, and potassium supplement.  2.  Palpitations, currently without active symptoms on low-dose Coreg. He has sinus bradycardia at baseline.  3.  Mixed hyperlipidemia, on high-dose Lipitor.  Last LDL  48.  Medication Adjustments/Labs and Tests Ordered: Current medicines are reviewed at length with the patient today.  Concerns regarding medicines are outlined above.   Tests Ordered: Orders Placed This Encounter  Procedures  . EKG 12-Lead  . ECHOCARDIOGRAM COMPLETE    Medication Changes: No orders of the defined types were placed in this encounter.   Disposition:  Follow up 6 months in the [place] office.  Signed, Jonelle Sidle, MD, [facility] 03/17/2021 9:28 AM    [facility] at [facility] [address] [place], [place], [place] [address] Phone: [phone]; Fax: [phone]

## 2021-03-17 ENCOUNTER — Ambulatory Visit (INDEPENDENT_AMBULATORY_CARE_PROVIDER_SITE_OTHER): Payer: Medicare Other | Admitting: Cardiology

## 2021-03-17 ENCOUNTER — Encounter: Payer: Self-pay | Admitting: Cardiology

## 2021-03-17 VITALS — BP 132/62 | HR 55 | Wt 287.8 lb

## 2021-03-17 DIAGNOSIS — E782 Mixed hyperlipidemia: Secondary | ICD-10-CM

## 2021-03-17 DIAGNOSIS — R002 Palpitations: Secondary | ICD-10-CM

## 2021-03-17 DIAGNOSIS — Z8679 Personal history of other diseases of the circulatory system: Secondary | ICD-10-CM

## 2021-03-17 DIAGNOSIS — I428 Other cardiomyopathies: Secondary | ICD-10-CM | POA: Diagnosis not present

## 2021-03-17 NOTE — Patient Instructions (Addendum)

## 2021-04-06 ENCOUNTER — Ambulatory Visit (INDEPENDENT_AMBULATORY_CARE_PROVIDER_SITE_OTHER): Payer: Medicare Other

## 2021-04-06 ENCOUNTER — Telehealth: Payer: Self-pay | Admitting: *Deleted

## 2021-04-06 DIAGNOSIS — I428 Other cardiomyopathies: Secondary | ICD-10-CM | POA: Diagnosis not present

## 2021-04-06 LAB — ECHOCARDIOGRAM COMPLETE
AR max vel: 2.54 cm2
AV Area VTI: 2.6 cm2
AV Area mean vel: 2.78 cm2
AV Mean grad: 4 mmHg
AV Peak grad: 9.2 mmHg
Ao pk vel: 1.52 m/s
Area-P 1/2: 2.99 cm2
Calc EF: 65.5 %
S' Lateral: 4.14 cm
Single Plane A2C EF: 64.1 %
Single Plane A4C EF: 66.4 %

## 2021-04-06 NOTE — Telephone Encounter (Signed)
-----   Message from Jonelle Sidle, MD sent at 04/06/2021  2:24 PM EDT ----- Results reviewed. LVEF normal at 60-65% on current therapy. No changes for now, keep scheduled follow-up.

## 2021-04-06 NOTE — Telephone Encounter (Signed)
Patient's wife, Claris Che informed. Copy sent to PCP

## 2021-04-21 ENCOUNTER — Other Ambulatory Visit: Payer: Self-pay | Admitting: Family Medicine

## 2021-04-21 DIAGNOSIS — M1A00X0 Idiopathic chronic gout, unspecified site, without tophus (tophi): Secondary | ICD-10-CM

## 2021-07-11 ENCOUNTER — Other Ambulatory Visit: Payer: Self-pay | Admitting: Family Medicine

## 2021-07-11 DIAGNOSIS — M1A00X0 Idiopathic chronic gout, unspecified site, without tophus (tophi): Secondary | ICD-10-CM

## 2021-07-13 ENCOUNTER — Other Ambulatory Visit: Payer: Self-pay

## 2021-07-13 ENCOUNTER — Encounter: Payer: Self-pay | Admitting: Nurse Practitioner

## 2021-07-13 ENCOUNTER — Ambulatory Visit (INDEPENDENT_AMBULATORY_CARE_PROVIDER_SITE_OTHER): Payer: Medicare Other | Admitting: Nurse Practitioner

## 2021-07-13 VITALS — BP 139/55 | HR 48 | Temp 97.4°F | Ht 73.0 in | Wt 290.0 lb

## 2021-07-13 DIAGNOSIS — H9201 Otalgia, right ear: Secondary | ICD-10-CM | POA: Diagnosis not present

## 2021-07-13 MED ORDER — AMOXICILLIN-POT CLAVULANATE 875-125 MG PO TABS: 1.0000 | ORAL_TABLET | Freq: Two times a day (BID) | ORAL | 0 refills | Status: DC

## 2021-07-13 NOTE — Progress Notes (Signed)
Acute Office Visit  Subjective:    Patient ID: Mitchell Appleby., male    DOB: 14-Nov-1951, 70 y.o.   MRN: [id number]  Chief Complaint  Patient presents with   Ear Pain    Otalgia  There is pain in the right ear. This is a recurrent problem. The current episode started in the past 7 days. The problem has been gradually worsening. There has been no fever. The pain is moderate. Associated symptoms include ear discharge and rhinorrhea. Pertinent negatives include no abdominal pain, coughing, hearing loss, rash or sore throat. He has tried ear drops for the symptoms. The treatment provided no relief.      Past Medical History:  Diagnosis Date   Cholelithiasis with cholecystitis 01/04/2012   Chronic diarrhea 04/17/2012   CKD (chronic kidney disease) stage 3, GFR 30-59 ml/min (HCC)    COPD (chronic obstructive pulmonary disease) (HCC)    DVT (deep venous thrombosis) (HCC) 04/19/2012   Essential hypertension    Gallstones    Gout    History of colonic polyps 04/10/2017   History of DVT (deep vein thrombosis)    Kidney stones    Lumbar disc disease    Neuropathy    Nonischemic cardiomyopathy (HCC)    Minor coronary atherosclerosis at cardiac catheterization 2003, LVEF initially 20% with normalization   Obesity    Orthostatic hypotension 09/09/2018   RA (rheumatoid arthritis) (HCC)    Sleep apnea    Subarachnoid hemorrhage (HCC) 09/07/2018   Subdural hematoma (HCC) 09/08/2018   Syncope, vasovagal 09/07/2018   Type 2 diabetes mellitus (HCC)     Past Surgical History:  Procedure Laterality Date   BARIATRIC SURGERY  01/11/2018   CHOLECYSTECTOMY  01/18/2012   Procedure: LAPAROSCOPIC CHOLECYSTECTOMY WITH INTRAOPERATIVE CHOLANGIOGRAM;  Surgeon: Maisie Fus A. Cornett, MD;  Location: [facility];  Service: General;  Laterality: N/A;   EYE SURGERY     Film over retina was removed.   PILONIDAL CYST EXCISION  1997   UMBILICAL HERNIA REPAIR     VARICOSE VEIN SURGERY     left leg    Family History   Problem Relation Age of Onset   Hypertension Mother    Stroke Mother    Diabetes Father    Hypertension Father    Heart disease Father    Diabetes Brother    Colon cancer Neg Hx     Social History   Socioeconomic History   Marital status: Married    Spouse name: margaret   Number of children: 2   Years of education: Not on file   Highest education level: Some college, no degree  Occupational History   Occupation: retired  Tobacco Use   Smoking status: Former    Types: Cigarettes    Quit date: 12/26/1994    Years since quitting: 26.5   Smokeless tobacco: Never  Vaping Use   Vaping Use: Never used  Substance and Sexual Activity   Alcohol use: No   Drug use: No   Sexual activity: Not on file  Other Topics Concern   Not on file  Social History Narrative   Lives at home with wife, and has two adult children. Still ambulatory, no cane or walker.    Social Determinants of Health   Financial Resource Strain: Not on file  Food Insecurity: Not on file  Transportation Needs: Not on file  Physical Activity: Not on file  Stress: Not on file  Social Connections: Not on file  Intimate Partner Violence: Not on  file    Outpatient Medications Prior to Visit  Medication Sig Dispense Refill   acetaminophen (TYLENOL) 500 MG tablet Take 500 mg by mouth every 6 (six) hours as needed.     albuterol-ipratropium (COMBIVENT) 18-103 MCG/ACT inhaler Inhale 2 puffs into the lungs every 6 (six) hours as needed. Wheezing       allopurinol (ZYLOPRIM) 300 MG tablet TAKE 1 TABLET BY MOUTH IN  THE MORNING 90 tablet 1   amLODipine (NORVASC) 5 MG tablet TAKE 1 TABLET BY MOUTH  DAILY FOR BLOOD PRESSURE 90 tablet 3   atorvastatin (LIPITOR) 80 MG tablet TAKE 1 TABLET BY MOUTH  DAILY AT 6 PM 90 tablet 1   calcitRIOL (ROCALTROL) 0.25 MCG capsule TAKE 1 CAPSULE BY MOUTH  DAILY WITH BREAKFAST 90 capsule 3   calcium carbonate (OSCAL) 1500 (600 Ca) MG TABS tablet Take 600 mg of elemental calcium by mouth 2  (two) times daily with a meal.     carvedilol (COREG) 6.25 MG tablet Take 0.5 tablets (3.125 mg total) by mouth 2 (two) times daily. 180 tablet 1   celecoxib (CELEBREX) 200 MG capsule TAKE 1 CAPSULE BY MOUTH  DAILY WITH FOOD 90 capsule 3   Colchicine 0.6 MG CAPS TAKE 1 CAPSULE BY MOUTH  DAILY 90 capsule 0   DULoxetine (CYMBALTA) 30 MG capsule TAKE 1 CAPSULE BY MOUTH  TWICE DAILY 180 capsule 1   fluticasone (FLONASE) 50 MCG/ACT nasal spray USE 2 SPRAYS IN BOTH  NOSTRILS DAILY AS NEEDED  FOR ALLERGIES OR RHINITIS 48 g 2   furosemide (LASIX) 40 MG tablet TAKE 1 TABLET BY MOUTH  DAILY IN THE MORNING 90 tablet 1   hydroxychloroquine (PLAQUENIL) 200 MG tablet Take 1 tablet by mouth in the morning and at bedtime.     lisinopril (ZESTRIL) 20 MG tablet TAKE 1 TABLET BY MOUTH  DAILY 90 tablet 1   mometasone (ELOCON) 0.1 % cream Apply topically daily. 45 g 3   Multiple Vitamin (MULTIVITAMIN) capsule Take 1 capsule by mouth daily.     neomycin-polymyxin-hydrocortisone (CORTISPORIN) 3.5-10000-1 OTIC suspension INSTILL 4 DROP INTO THE  AFFECTED EARS TWICE DAILY  FOR 10 DAYS 10 mL 0   pantoprazole (PROTONIX) 40 MG tablet TAKE 1 TABLET BY MOUTH  DAILY 90 tablet 3   potassium chloride SA (KLOR-CON) 20 MEQ tablet TAKE 1 TABLET BY MOUTH  DAILY FOR POTASSIUM  REPLACEMENT/ SUPPLEMENT 90 tablet 3   tamsulosin (FLOMAX) 0.4 MG CAPS capsule TAKE 1 CAPSULE BY MOUTH AT  BEDTIME 90 capsule 1   vitamin B-12 (CYANOCOBALAMIN) 500 MCG tablet Take 500 mcg by mouth daily.     silver sulfADIAZINE (SILVADENE) 1 % cream Apply 1 application topically daily.     No facility-administered medications prior to visit.    Allergies  Allergen Reactions   Ciprofloxacin Hives   Definity [Perflutren Lipid Microsphere] Nausea And Vomiting    During ECHO with Definity injection pt became nauseated and vomited    Levofloxacin [Levofloxacin] Hives    Review of Systems  Constitutional: Negative.   HENT:  Positive for ear discharge, ear  pain and rhinorrhea. Negative for hearing loss and sore throat.   Respiratory:  Negative for cough.   Gastrointestinal: Negative.  Negative for abdominal pain.  Skin:  Negative for rash.  All other systems reviewed and are negative.     Objective:    Physical Exam Vitals and nursing note reviewed.  Constitutional:      Appearance: Normal appearance. He is obese.  HENT:     Head: Normocephalic.     Right Ear: External ear normal. Tenderness present. There is no impacted cerumen.     Left Ear: External ear normal. There is impacted cerumen.     Mouth/Throat:     Mouth: Mucous membranes are moist.     Pharynx: Oropharynx is clear.  Eyes:     Conjunctiva/sclera: Conjunctivae normal.  Cardiovascular:     Rate and Rhythm: Normal rate and regular rhythm.  Abdominal:     General: Bowel sounds are normal.  Skin:    Findings: No rash.  Neurological:     Mental Status: He is alert and oriented to person, place, and time.    BP (!) 139/55   Pulse (!) 48   Temp (!) 97.4 F (36.3 C) (Temporal)   Ht 6\' 1"  (1.854 m)   Wt 290 lb (131.5 kg)   SpO2 100%   BMI 38.26 kg/m  Wt Readings from Last 3 Encounters:  07/13/21 290 lb (131.5 kg)  03/17/21 287 lb 12.8 oz (130.5 kg)  02/11/21 280 lb (127 kg)    Health Maintenance Due  Topic Date Due   FOOT EXAM  Never done   OPHTHALMOLOGY EXAM  Never done   COLONOSCOPY (Pts 45-37yrs Insurance coverage will need to be confirmed)  Never done   Zoster Vaccines- Shingrix (2 of 2) 12/15/2020   COVID-19 Vaccine (4 - Booster for Moderna series) 01/20/2021   HEMOGLOBIN A1C  02/19/2021    There are no preventive care reminders to display for this patient.   Lab Results  Component Value Date   TSH 1.757 05/23/2012   Lab Results  Component Value Date   WBC 5.4 02/11/2021   HGB 10.4 (L) 02/11/2021   HCT 33.0 (L) 02/11/2021   MCV 88.2 02/11/2021   PLT 151 02/11/2021   Lab Results  Component Value Date   NA 135 02/11/2021   K 3.9  02/11/2021   CO2 25 02/11/2021   GLUCOSE 127 (H) 02/11/2021   BUN 38 (H) 02/11/2021   CREATININE 1.14 02/11/2021   BILITOT 0.8 02/11/2021   ALKPHOS 61 02/11/2021   AST 36 02/11/2021   ALT 31 02/11/2021   PROT 6.4 (L) 02/11/2021   ALBUMIN 3.4 (L) 02/11/2021   CALCIUM 8.4 (L) 02/11/2021   ANIONGAP 5 02/11/2021   Lab Results  Component Value Date   CHOL 104 03/06/2019   Lab Results  Component Value Date   HDL 44 03/06/2019   Lab Results  Component Value Date   LDLCALC 48 03/06/2019   Lab Results  Component Value Date   TRIG 58 03/06/2019   Lab Results  Component Value Date   CHOLHDL 2.4 03/06/2019   Lab Results  Component Value Date   HGBA1C 6.0 08/19/2020       Assessment & Plan:   Problem List Items Addressed This Visit       Other   Right ear pain - Primary    Worsening unresolved right ear pain in the last 1 to 2 weeks.  Patient is reporting ear discharge with foul odor and green in color.  Started patient on Augmentin 875-125 mg tablet by mouth for 7 days.  Education provided to patient printed handouts given.  Rx sent to pharmacy.  Follow-up with worsening unresolved symptoms.  Patient verbalized understanding       Relevant Medications   amoxicillin-clavulanate (AUGMENTIN) 875-125 MG tablet     Meds ordered this encounter  Medications   amoxicillin-clavulanate (  AUGMENTIN) 875-125 MG tablet    Sig: Take 1 tablet by mouth 2 (two) times daily.    Dispense:  14 tablet    Refill:  0    Order Specific Question:   Supervising Provider    Answer:   Raliegh Ip [[id number]]     Daryll Drown, NP

## 2021-07-13 NOTE — Patient Instructions (Signed)
Otitis Media, Adult  Otitis media is a condition in which the middle ear is red and swollen (inflamed) and full of fluid. The middle ear is the part of the ear that contains bones for hearing as well as air that helps send sounds to the brain. The conditionusually goes away on its own. What are the causes? This condition is caused by a blockage in the eustachian tube. The eustachian tube connects the middle ear to the back of the nose. It normally allows air into the middle ear. The blockage is caused by fluid or swelling. Problems that can cause blockage include: A cold or infection that affects the nose, mouth, or throat. Allergies. An irritant, such as tobacco smoke. Adenoids that have become large. The adenoids are soft tissue located in the back of the throat, behind the nose and the roof of the mouth. Growth or swelling in the upper part of the throat, just behind the nose (nasopharynx). Damage to the ear caused by change in pressure. This is called barotrauma. What are the signs or symptoms? Symptoms of this condition include: Ear pain. Fever. Problems with hearing. Being tired. Fluid leaking from the ear. Ringing in the ear. How is this treated? This condition can go away on its own within 3-5 days. But if the condition is caused by bacteria or does not go away on its own, or if it keeps coming back, your doctor may: Give you antibiotic medicines. Give you medicines for pain. Follow these instructions at home: Take over-the-counter and prescription medicines only as told by your doctor. If you were prescribed an antibiotic medicine, take it as told by your doctor. Do not stop taking the antibiotic even if you start to feel better. Keep all follow-up visits as told by your doctor. This is important. Contact a doctor if: You have bleeding from your nose. There is a lump on your neck. You are not feeling better in 5 days. You feel worse instead of better. Get help right away  if: You have pain that is not helped with medicine. You have swelling, redness, or pain around your ear. You get a stiff neck. You cannot move part of your face (paralysis). You notice that the bone behind your ear hurts when you touch it. You get a very bad headache. Summary Otitis media means that the middle ear is red, swollen, and full of fluid. This condition usually goes away on its own. If the problem does not go away, treatment may be needed. You may be given medicines to treat the infection or to treat your pain. If you were prescribed an antibiotic medicine, take it as told by your doctor. Do not stop taking the antibiotic even if you start to feel better. Keep all follow-up visits as told by your doctor. This is important. This information is not intended to replace advice given to you by your health care provider. Make sure you discuss any questions you have with your healthcare provider. Document Revised: 11/14/2019 Document Reviewed: 11/14/2019 Elsevier Patient Education  2022 [employer] Inc[employer]  

## 2021-07-13 NOTE — Assessment & Plan Note (Signed)
Worsening unresolved right ear pain in the last 1 to 2 weeks.  Patient is reporting ear discharge with foul odor and green in color.  Started patient on Augmentin 875-125 mg tablet by mouth for 7 days.  Education provided to patient printed handouts given.  Rx sent to pharmacy.  Follow-up with worsening unresolved symptoms.  Patient verbalized understanding

## 2021-07-14 ENCOUNTER — Other Ambulatory Visit: Payer: Self-pay | Admitting: Family Medicine

## 2021-07-14 DIAGNOSIS — N400 Enlarged prostate without lower urinary tract symptoms: Secondary | ICD-10-CM

## 2021-07-14 DIAGNOSIS — E782 Mixed hyperlipidemia: Secondary | ICD-10-CM

## 2021-07-21 ENCOUNTER — Other Ambulatory Visit: Payer: Self-pay | Admitting: Cardiology

## 2021-08-03 ENCOUNTER — Telehealth: Payer: Self-pay | Admitting: Family Medicine

## 2021-08-03 ENCOUNTER — Encounter: Payer: Self-pay | Admitting: Family Medicine

## 2021-08-03 ENCOUNTER — Ambulatory Visit (INDEPENDENT_AMBULATORY_CARE_PROVIDER_SITE_OTHER): Payer: Medicare Other | Admitting: Family Medicine

## 2021-08-03 ENCOUNTER — Other Ambulatory Visit: Payer: Self-pay

## 2021-08-03 VITALS — Temp 97.2°F | Ht 73.0 in | Wt 289.6 lb

## 2021-08-03 DIAGNOSIS — D500 Iron deficiency anemia secondary to blood loss (chronic): Secondary | ICD-10-CM | POA: Diagnosis not present

## 2021-08-03 DIAGNOSIS — E782 Mixed hyperlipidemia: Secondary | ICD-10-CM | POA: Diagnosis not present

## 2021-08-03 DIAGNOSIS — I509 Heart failure, unspecified: Secondary | ICD-10-CM

## 2021-08-03 DIAGNOSIS — M1A00X0 Idiopathic chronic gout, unspecified site, without tophus (tophi): Secondary | ICD-10-CM | POA: Diagnosis not present

## 2021-08-03 DIAGNOSIS — I10 Essential (primary) hypertension: Secondary | ICD-10-CM

## 2021-08-03 DIAGNOSIS — R6889 Other general symptoms and signs: Secondary | ICD-10-CM | POA: Diagnosis not present

## 2021-08-03 DIAGNOSIS — E1122 Type 2 diabetes mellitus with diabetic chronic kidney disease: Secondary | ICD-10-CM | POA: Diagnosis not present

## 2021-08-03 DIAGNOSIS — T671XXA Heat syncope, initial encounter: Secondary | ICD-10-CM

## 2021-08-03 DIAGNOSIS — N1832 Chronic kidney disease, stage 3b: Secondary | ICD-10-CM

## 2021-08-03 DIAGNOSIS — Z125 Encounter for screening for malignant neoplasm of prostate: Secondary | ICD-10-CM

## 2021-08-03 MED ORDER — DULOXETINE HCL 30 MG PO CPEP: 30.0000 mg | ORAL_CAPSULE | Freq: Two times a day (BID) | ORAL | 1 refills | Status: DC

## 2021-08-03 MED ORDER — SILDENAFIL CITRATE 20 MG PO TABS: ORAL_TABLET | ORAL | 5 refills | Status: DC

## 2021-08-03 MED ORDER — CARVEDILOL 6.25 MG PO TABS: 3.1250 mg | ORAL_TABLET | Freq: Two times a day (BID) | ORAL | 3 refills | Status: DC

## 2021-08-03 MED ORDER — METFORMIN HCL ER 500 MG PO TB24: 500.0000 mg | ORAL_TABLET | Freq: Every day | ORAL | 2 refills | Status: DC

## 2021-08-03 MED ORDER — ATORVASTATIN CALCIUM 80 MG PO TABS: ORAL_TABLET | ORAL | 3 refills | Status: DC

## 2021-08-03 NOTE — Progress Notes (Signed)
Subjective:  Patient ID: Mitchell Parker., male    DOB: 07/13/1951  Age: 70 y.o. MRN: [id number]  CC: Annual Exam   HPI KEIYON PLACK Sr. presents for CPE  Depression screen [facility] 2/9 08/03/2021 07/13/2021 08/28/2020  Decreased Interest 0 0 0  Down, Depressed, Hopeless 0 0 0  PHQ - 2 Score 0 0 0  Altered sleeping - - -  Tired, decreased energy - - -  Change in appetite - - -  Feeling bad or failure about yourself  - - -  Trouble concentrating - - -  Moving slowly or fidgety/restless - - -  Suicidal thoughts - - -  PHQ-9 Score - - -  Difficult doing work/chores - - -    History Dyshawn has a past medical history of Cholelithiasis with cholecystitis (01/04/2012), Chronic diarrhea (04/17/2012), CKD (chronic kidney disease) stage 3, GFR 30-59 ml/min (HCC), COPD (chronic obstructive pulmonary disease) ([place]), DVT (deep venous thrombosis) ([place]) (04/19/2012), Essential hypertension, Gallstones, Gout, History of colonic polyps (04/10/2017), History of DVT (deep vein thrombosis), Kidney stones, Lumbar disc disease, Neuropathy, Nonischemic cardiomyopathy ([place]), Obesity, Orthostatic hypotension (09/09/2018), RA (rheumatoid arthritis) ([place]), Sleep apnea, Subarachnoid hemorrhage ([place]) (09/07/2018), Subdural hematoma ([place]) (09/08/2018), Syncope, vasovagal (09/07/2018), and Type 2 diabetes mellitus ([place]).   He has a past surgical history that includes Umbilical hernia repair; Pilonidal cyst excision (1997); Varicose vein surgery; Cholecystectomy (01/18/2012); Bariatric Surgery (01/11/2018); and Eye surgery.   His family history includes Diabetes in his brother and father; Heart disease in his father; Hypertension in his father and mother; Stroke in his mother.He reports that he quit smoking about 26 years ago. His smoking use included cigarettes. He has never used smokeless tobacco. He reports that he does not drink alcohol and does not use drugs.    ROS Review of Systems  Constitutional:  Negative for activity  change, fatigue and unexpected weight change.  HENT:  Negative for congestion, ear pain, hearing loss, postnasal drip and trouble swallowing.   Eyes:  Negative for pain and visual disturbance.  Respiratory:  Negative for cough, chest tightness and shortness of breath.   Cardiovascular:  Negative for chest pain, palpitations and leg swelling.  Gastrointestinal:  Negative for abdominal distention, abdominal pain, blood in stool, constipation, diarrhea, nausea and vomiting.  Endocrine: Negative for cold intolerance, heat intolerance and polydipsia.  Genitourinary:  Negative for difficulty urinating, dysuria, flank pain, frequency and urgency.  Musculoskeletal:  Negative for arthralgias and joint swelling.  Skin:  Negative for color change, rash and wound.  Neurological:  Negative for dizziness, syncope, speech difficulty, weakness, light-headedness, numbness and headaches.  Hematological:  Does not bruise/bleed easily.  Psychiatric/Behavioral:  Negative for confusion, decreased concentration, dysphoric mood and sleep disturbance. The patient is not nervous/anxious.    Objective:  Temp (!) 97.2 F (36.2 C)   Ht 6' 1" (1.854 m)   Wt 289 lb 9.6 oz (131.4 kg)   BMI 38.21 kg/m   BP Readings from Last 3 Encounters:  07/13/21 (!) 139/55  03/17/21 132/62  02/11/21 (!) 179/64    Wt Readings from Last 3 Encounters:  08/03/21 289 lb 9.6 oz (131.4 kg)  07/13/21 290 lb (131.5 kg)  03/17/21 287 lb 12.8 oz (130.5 kg)     Physical Exam Constitutional:      Appearance: He is well-developed.  HENT:     Head: Normocephalic and atraumatic.  Eyes:     Pupils: Pupils are equal, round, and reactive to light.  Neck:  Thyroid: No thyromegaly.     Trachea: No tracheal deviation.  Cardiovascular:     Rate and Rhythm: Normal rate and regular rhythm.     Heart sounds: Normal heart sounds. No murmur heard.   No friction rub. No gallop.  Pulmonary:     Breath sounds: Normal breath sounds. No  wheezing or rales.  Abdominal:     General: Bowel sounds are normal. There is no distension.     Palpations: Abdomen is soft. There is no mass.     Tenderness: There is no abdominal tenderness.     Hernia: There is no hernia in the left inguinal area.  Genitourinary:    Penis: Normal.      Testes: Normal.  Musculoskeletal:        General: Normal range of motion.     Cervical back: Normal range of motion.  Lymphadenopathy:     Cervical: No cervical adenopathy.  Skin:    General: Skin is warm and dry.  Neurological:     Mental Status: He is alert and oriented to person, place, and time.      Assessment & Plan:   Rutherford was seen today for annual exam.  Diagnoses and all orders for this visit:  Iron deficiency anemia due to chronic blood loss -     Anemia Profile B -     Ambulatory referral to Gastroenterology  Idiopathic chronic gout without tophus, unspecified site -     CBC with Differential/Platelet -     CMP14+EGFR  Mixed hyperlipidemia -     atorvastatin (LIPITOR) 80 MG tablet; TAKE 1 TABLET BY MOUTH  DAILY AT 6 PM -     CBC with Differential/Platelet -     CMP14+EGFR -     Lipid panel  Chronic congestive heart failure, unspecified heart failure type (HCC) -     carvedilol (COREG) 6.25 MG tablet; Take 0.5 tablets (3.125 mg total) by mouth 2 (two) times daily. -     CBC with Differential/Platelet -     CMP14+EGFR  Primary hypertension -     carvedilol (COREG) 6.25 MG tablet; Take 0.5 tablets (3.125 mg total) by mouth 2 (two) times daily. -     CBC with Differential/Platelet -     CMP14+EGFR  Type 2 diabetes mellitus with stage 3b chronic kidney disease, without long-term current use of insulin (HCC) -     carvedilol (COREG) 6.25 MG tablet; Take 0.5 tablets (3.125 mg total) by mouth 2 (two) times daily. -     CBC with Differential/Platelet -     CMP14+EGFR  Screening for prostate cancer -     PSA, total and free  Heat syncope, initial encounter -     EKG  12-Lead  Other orders -     DULoxetine (CYMBALTA) 30 MG capsule; Take 1 capsule (30 mg total) by mouth 2 (two) times daily. -     metFORMIN (GLUCOPHAGE-XR) 500 MG 24 hr tablet; Take 1 tablet (500 mg total) by mouth daily with breakfast. -     sildenafil (REVATIO) 20 MG tablet; Take 2-5 pills at once, orally, with each sexual encounter      I have discontinued [name] "bill"'s amoxicillin-clavulanate and metFORMIN. I have also changed his DULoxetine. Additionally, I am having him start on metFORMIN and sildenafil. Lastly, I am having him maintain his albuterol-ipratropium, multivitamin, calcium carbonate, vitamin B-12, acetaminophen, mometasone, silver sulfADIAZINE, potassium chloride SA, pantoprazole, celecoxib, neomycin-polymyxin-hydrocortisone, amLODipine, calcitRIOL, fluticasone, hydroxychloroquine, allopurinol, Colchicine,  furosemide, tamsulosin, lisinopril, ferrous sulfate, atorvastatin, and carvedilol.  Allergies as of 08/03/2021       Reactions   Ciprofloxacin Hives   Definity [perflutren Lipid Microsphere] Nausea And Vomiting   During ECHO with Definity injection pt became nauseated and vomited    Levofloxacin [levofloxacin] Hives        Medication List        Accurate as of August 03, 2021  5:51 PM. If you have any questions, ask your nurse or doctor.          STOP taking these medications    amoxicillin-clavulanate 875-125 MG tablet Commonly known as: AUGMENTIN Stopped by: [name], MD   metFORMIN 500 MG tablet Commonly known as: GLUCOPHAGE Replaced by: metFORMIN 500 MG 24 hr tablet Stopped by: [name], MD       TAKE these medications    acetaminophen 500 MG tablet Commonly known as: TYLENOL Take 500 mg by mouth every 6 (six) hours as needed.   albuterol-ipratropium 18-103 MCG/ACT inhaler Commonly known as: COMBIVENT Inhale 2 puffs into the lungs every 6 (six) hours as needed. Wheezing   allopurinol 300 MG tablet Commonly known  as: ZYLOPRIM TAKE 1 TABLET BY MOUTH IN  THE MORNING   amLODipine 5 MG tablet Commonly known as: NORVASC TAKE 1 TABLET BY MOUTH  DAILY FOR BLOOD PRESSURE   atorvastatin 80 MG tablet Commonly known as: LIPITOR TAKE 1 TABLET BY MOUTH  DAILY AT 6 PM What changed:  how much to take how to take this additional instructions Changed by: [name], MD   calcitRIOL 0.25 MCG capsule Commonly known as: ROCALTROL TAKE 1 CAPSULE BY MOUTH  DAILY WITH BREAKFAST   calcium carbonate 1500 (600 Ca) MG Tabs tablet Commonly known as: OSCAL Take 600 mg of elemental calcium by mouth 2 (two) times daily with a meal.   carvedilol 6.25 MG tablet Commonly known as: COREG Take 0.5 tablets (3.125 mg total) by mouth 2 (two) times daily.   celecoxib 200 MG capsule Commonly known as: CELEBREX TAKE 1 CAPSULE BY MOUTH  DAILY WITH FOOD   Colchicine 0.6 MG Caps TAKE 1 CAPSULE BY MOUTH  DAILY   DULoxetine 30 MG capsule Commonly known as: CYMBALTA Take 1 capsule (30 mg total) by mouth 2 (two) times daily.   ferrous sulfate 325 (65 FE) MG tablet Take 325 mg by mouth 2 (two) times daily with a meal.   fluticasone 50 MCG/ACT nasal spray Commonly known as: FLONASE USE 2 SPRAYS IN BOTH  NOSTRILS DAILY AS NEEDED  FOR ALLERGIES OR RHINITIS   furosemide 40 MG tablet Commonly known as: LASIX TAKE 1 TABLET BY MOUTH  DAILY IN THE MORNING   hydroxychloroquine 200 MG tablet Commonly known as: PLAQUENIL Take 1 tablet by mouth in the morning and at bedtime.   lisinopril 20 MG tablet Commonly known as: ZESTRIL TAKE 1 TABLET BY MOUTH  DAILY   metFORMIN 500 MG 24 hr tablet Commonly known as: GLUCOPHAGE-XR Take 1 tablet (500 mg total) by mouth daily with breakfast. Replaces: metFORMIN 500 MG tablet Started by: [name], MD   mometasone 0.1 % cream Commonly known as: ELOCON Apply topically daily.   multivitamin capsule Take 1 capsule by mouth daily.   neomycin-polymyxin-hydrocortisone  3.5-10000-1 OTIC suspension Commonly known as: CORTISPORIN INSTILL 4 DROP INTO THE  AFFECTED EARS TWICE DAILY  FOR 10 DAYS   pantoprazole 40 MG tablet Commonly known as: PROTONIX TAKE 1 TABLET BY MOUTH  DAILY   potassium  chloride SA 20 MEQ tablet Commonly known as: KLOR-CON TAKE 1 TABLET BY MOUTH  DAILY FOR POTASSIUM  REPLACEMENT/ SUPPLEMENT   sildenafil 20 MG tablet Commonly known as: REVATIO Take 2-5 pills at once, orally, with each sexual encounter Started by: [name], MD   silver sulfADIAZINE 1 % cream Commonly known as: SILVADENE Apply 1 application topically daily.   tamsulosin 0.4 MG Caps capsule Commonly known as: FLOMAX TAKE 1 CAPSULE BY MOUTH AT  BEDTIME   vitamin B-12 500 MCG tablet Commonly known as: CYANOCOBALAMIN Take 500 mcg by mouth daily.         Follow-up: Return in about 3 months (around 11/03/2021).  [name], M.D.

## 2021-08-03 NOTE — Patient Instructions (Signed)
https://www.diabeteseducator.org/docs/default-source/living-with-diabetes/conquering-the-grocery-store-v1.pdf?[email or web address]">  Carbohydrate Counting for Diabetes Mellitus, Adult Carbohydrate counting is a method of keeping track of how many carbohydrates you eat. Eating carbohydrates naturally increases the amount of sugar (glucose) in the blood. Counting how many carbohydrates you eat improves your bloodglucose control, which helps you manage your diabetes. It is important to know how many carbohydrates you can safely have in each meal. This is different for every person. A dietitian can help you make a meal plan and calculate how many carbohydrates you should have at each meal andsnack. What foods contain carbohydrates? Carbohydrates are found in the following foods: Grains, such as breads and cereals. Dried beans and soy products. Starchy vegetables, such as potatoes, peas, and corn. Fruit and fruit juices. Milk and yogurt. Sweets and snack foods, such as cake, cookies, candy, chips, and soft drinks. How do I count carbohydrates in foods? There are two ways to count carbohydrates in food. You can read food labels or learn standard serving sizes of foods. You can use either of the methods or acombination of both. Using the Nutrition Facts label The Nutrition Facts list is included on the labels of almost all packaged foods and beverages in the [place] It includes: The serving size. Information about nutrients in each serving, including the grams (g) of carbohydrate per serving. To use the Nutrition Facts: Decide how many servings you will have. Multiply the number of servings by the number of carbohydrates per serving. The resulting number is the total amount of carbohydrates that you will be having. Learning the standard serving sizes of foods When you eat carbohydrate foods that are not packaged or do not include Nutrition Facts on the label, you need to measure the servings in order to count the  amount of carbohydrates. Measure the foods that you will eat with a food scale or measuring cup, if needed. Decide how many standard-size servings you will eat. Multiply the number of servings by 15. For foods that contain carbohydrates, one serving equals 15 g of carbohydrates. For example, if you eat 2 cups or 10 oz (300 g) of strawberries, you will have eaten 2 servings and 30 g of carbohydrates (2 servings x 15 g = 30 g). For foods that have more than one food mixed, such as soups and casseroles, you must count the carbohydrates in each food that is included. The following list contains standard serving sizes of common carbohydrate-rich foods. Each of these servings has about 15 g of carbohydrates: 1 slice of bread. 1 six-inch (15 cm) tortilla. ? cup or 2 oz (53 g) cooked rice or pasta.  cup or 3 oz (85 g) cooked or canned, drained and rinsed beans or lentils.  cup or 3 oz (85 g) starchy vegetable, such as peas, corn, or squash.  cup or 4 oz (120 g) hot cereal.  cup or 3 oz (85 g) boiled or mashed potatoes, or  or 3 oz (85 g) of a large baked potato.  cup or 4 fl oz (118 mL) fruit juice. 1 cup or 8 fl oz (237 mL) milk. 1 small or 4 oz (106 g) apple.  or 2 oz (63 g) of a medium banana. 1 cup or 5 oz (150 g) strawberries. 3 cups or 1 oz (24 g) popped popcorn. What is an example of carbohydrate counting? To calculate the number of carbohydrates in this sample meal, follow the stepsshown below. Sample meal 3 oz (85 g) chicken breast. ? cup or 4 oz (106 g) brown rice.    cup or 3 oz (85 g) corn. 1 cup or 8 fl oz (237 mL) milk. 1 cup or 5 oz (150 g) strawberries with sugar-free whipped topping. Carbohydrate calculation Identify the foods that contain carbohydrates: Rice. Corn. Milk. Strawberries. Calculate how many servings you have of each food: 2 servings rice. 1 serving corn. 1 serving milk. 1 serving strawberries. Multiply each number of servings by 15 g: 2 servings  rice x 15 g = 30 g. 1 serving corn x 15 g = 15 g. 1 serving milk x 15 g = 15 g. 1 serving strawberries x 15 g = 15 g. Add together all of the amounts to find the total grams of carbohydrates eaten: 30 g + 15 g + 15 g + 15 g = 75 g of carbohydrates total. What are tips for following this plan? Shopping Develop a meal plan and then make a shopping list. Buy fresh and frozen vegetables, fresh and frozen fruit, dairy, eggs, beans, lentils, and whole grains. Look at food labels. Choose foods that have more fiber and less sugar. Avoid processed foods and foods with added sugars. Meal planning Aim to have the same amount of carbohydrates at each meal and for each snack time. Plan to have regular, balanced meals and snacks. Where to find more information American Diabetes Association: www.diabetes.org Centers for Disease Control and Prevention: www.cdc.gov Summary Carbohydrate counting is a method of keeping track of how many carbohydrates you eat. Eating carbohydrates naturally increases the amount of sugar (glucose) in the blood. Counting how many carbohydrates you eat improves your blood glucose control, which helps you manage your diabetes. A dietitian can help you make a meal plan and calculate how many carbohydrates you should have at each meal and snack. This information is not intended to replace advice given to you by your health care provider. Make sure you discuss any questions you have with your healthcare provider. Document Revised: 12/12/2019 Document Reviewed: 12/13/2019 Elsevier Patient Education  2021 Elsevier Inc.  

## 2021-08-04 ENCOUNTER — Other Ambulatory Visit: Payer: Self-pay | Admitting: Family Medicine

## 2021-08-04 LAB — ANEMIA PROFILE B
Basophils Absolute: 0 10*3/uL (ref 0.0–0.2)
Basos: 1 %
EOS (ABSOLUTE): 0.2 10*3/uL (ref 0.0–0.4)
Eos: 5 %
Ferritin: 47 ng/mL (ref 30–400)
Folate: >20 ng/mL (ref >3.0)
Hematocrit: 38.9 % (ref 37.5–51.0)
Hemoglobin: 12.9 g/dL — ABNORMAL LOW (ref 13.0–17.7)
Immature Grans (Abs): 0 10*3/uL (ref 0.0–0.1)
Immature Granulocytes: 0 %
Iron Saturation: 30 % (ref 15–55)
Iron: 91 ug/dL (ref 38–169)
Lymphocytes Absolute: 0.8 10*3/uL (ref 0.7–3.1)
Lymphs: 17 %
MCH: 30.4 pg (ref 26.6–33.0)
MCHC: 33.2 g/dL (ref 31.5–35.7)
MCV: 92 fL (ref 79–97)
Monocytes Absolute: 0.5 10*3/uL (ref 0.1–0.9)
Monocytes: 11 %
Neutrophils Absolute: 3.1 10*3/uL (ref 1.4–7.0)
Neutrophils: 66 %
Platelets: 134 10*3/uL — ABNORMAL LOW (ref 150–450)
RBC: 4.25 x10E6/uL (ref 4.14–5.80)
RDW: 19.4 % — ABNORMAL HIGH (ref 11.6–15.4)
Retic Ct Pct: 0.9 % (ref 0.6–2.6)
Total Iron Binding Capacity: 300 ug/dL (ref 250–450)
UIBC: 209 ug/dL (ref 111–343)
Vitamin B-12: >2000 pg/mL — ABNORMAL HIGH (ref 232–1245)
WBC: 4.6 10*3/uL (ref 3.4–10.8)

## 2021-08-04 LAB — CMP14+EGFR
ALT: 30 IU/L (ref 0–44)
AST: 35 IU/L (ref 0–40)
Albumin/Globulin Ratio: 1.7 (ref 1.2–2.2)
Albumin: 4.2 g/dL (ref 3.8–4.8)
Alkaline Phosphatase: 63 IU/L (ref 44–121)
BUN/Creatinine Ratio: 33 — ABNORMAL HIGH (ref 10–24)
BUN: 42 mg/dL — ABNORMAL HIGH (ref 8–27)
Bilirubin Total: 0.6 mg/dL (ref 0.0–1.2)
CO2: 26 mmol/L (ref 20–29)
Calcium: 8.9 mg/dL (ref 8.6–10.2)
Chloride: 100 mmol/L (ref 96–106)
Creatinine, Ser: 1.27 mg/dL (ref 0.76–1.27)
Globulin, Total: 2.5 g/dL (ref 1.5–4.5)
Glucose: 147 mg/dL — ABNORMAL HIGH (ref 65–99)
Potassium: 4.5 mmol/L (ref 3.5–5.2)
Sodium: 140 mmol/L (ref 134–144)
Total Protein: 6.7 g/dL (ref 6.0–8.5)
eGFR: 62 mL/min/{1.73_m2} (ref >59)

## 2021-08-04 LAB — PSA, TOTAL AND FREE
PSA, Free Pct: 31.2 %
PSA, Free: 0.78 ng/mL
Prostate Specific Ag, Serum: 2.5 ng/mL (ref 0.0–4.0)

## 2021-08-04 LAB — LIPID PANEL
Chol/HDL Ratio: 2.5 ratio (ref 0.0–5.0)
Cholesterol, Total: 103 mg/dL (ref 100–199)
HDL: 41 mg/dL (ref >39)
LDL Chol Calc (NIH): 45 mg/dL (ref 0–99)
Triglycerides: 87 mg/dL (ref 0–149)
VLDL Cholesterol Cal: 17 mg/dL (ref 5–40)

## 2021-08-04 MED ORDER — SILDENAFIL CITRATE 20 MG PO TABS: ORAL_TABLET | ORAL | 5 refills | Status: DC

## 2021-08-04 NOTE — Telephone Encounter (Signed)
Please let the patient know that I sent their prescription to their pharmacy. Thanks, WS 

## 2021-08-04 NOTE — Telephone Encounter (Signed)
Patient aware, he would like this sent to [facility] in [place].

## 2021-08-04 NOTE — Telephone Encounter (Signed)
I can send the prescription to a local pharmacy that offers it at a good price. [facility]  in [place] will mail to him.

## 2021-09-30 ENCOUNTER — Other Ambulatory Visit: Payer: Self-pay | Admitting: Family Medicine

## 2021-09-30 DIAGNOSIS — M1A00X0 Idiopathic chronic gout, unspecified site, without tophus (tophi): Secondary | ICD-10-CM

## 2021-10-04 ENCOUNTER — Other Ambulatory Visit: Payer: Self-pay | Admitting: Family Medicine

## 2021-10-05 ENCOUNTER — Ambulatory Visit (INDEPENDENT_AMBULATORY_CARE_PROVIDER_SITE_OTHER): Payer: Medicare Other

## 2021-10-05 VITALS — Ht 73.0 in | Wt 290.0 lb

## 2021-10-05 DIAGNOSIS — Z0000 Encounter for general adult medical examination without abnormal findings: Secondary | ICD-10-CM

## 2021-10-05 NOTE — Progress Notes (Signed)
Subjective:   Mitchell Parker. is a 70 y.o. male who presents for Medicare Annual/Subsequent preventive examination.  Virtual Visit via Telephone Note  I connected with  ARNETT DUDDY Sr. on 10/05/21 at 10:30 AM EDT by telephone and verified that I am speaking with the correct person using two identifiers.  Location: Patient: Home Provider: [facility] Persons participating in the virtual visit: patient/Nurse Health Advisor   I discussed the limitations, risks, security and privacy concerns of performing an evaluation and management service by telephone and the availability of in person appointments. The patient expressed understanding and agreed to proceed.  Interactive audio and video telecommunications were attempted between this nurse and patient, however failed, due to patient having technical difficulties OR patient did not have access to video capability.  We continued and completed visit with audio only.  Some vital signs may be absent or patient reported.   Daymen Hassebrock E Raphaella Larkin, LPN   Review of Systems     Cardiac Risk Factors include: advanced age (>58men, >23 women);family history of premature cardiovascular disease;obesity (BMI >30kg/m2);male gender;dyslipidemia;hypertension;Other (see comment), Risk factor comments: COPD, OSA, hx of P.E., CHF, Venous insufficiency     Objective:    Today's Vitals   10/05/21 1042 10/05/21 1043  Weight: 290 lb (131.5 kg)   Height: 6\' 1"  (1.854 m)   PainSc:  4    Body mass index is 38.26 kg/m.  Advanced Directives 10/05/2021 02/11/2021 04/30/2019 09/07/2018 09/07/2018 10/08/2012 05/23/2012  Does Patient Have a Medical Advance Directive? Yes No Yes No No Patient has advance directive, copy in chart Patient has advance directive, copy not in chart  Type of Advance Directive Healthcare Power of [place];Living will - Healthcare Power of [place];Living will - - Living will Living will  Copy of Healthcare Power of Attorney in Chart? No - copy requested  - No - copy requested - - - -  Would patient like information on creating a medical advance directive? - - - No - Patient declined - - -  Pre-existing out of facility DNR order (yellow form or pink MOST form) - - - - - - No    Current Medications (verified) Outpatient Encounter Medications as of 10/05/2021  Medication Sig   acetaminophen (TYLENOL) 500 MG tablet Take 500 mg by mouth every 6 (six) hours as needed.   albuterol-ipratropium (COMBIVENT) 18-103 MCG/ACT inhaler Inhale 2 puffs into the lungs every 6 (six) hours as needed. Wheezing     allopurinol (ZYLOPRIM) 300 MG tablet TAKE 1 TABLET BY MOUTH IN  THE MORNING   amLODipine (NORVASC) 5 MG tablet TAKE 1 TABLET BY MOUTH  DAILY FOR BLOOD PRESSURE   atorvastatin (LIPITOR) 80 MG tablet TAKE 1 TABLET BY MOUTH  DAILY AT 6 PM   calcitRIOL (ROCALTROL) 0.25 MCG capsule TAKE 1 CAPSULE BY MOUTH  DAILY WITH BREAKFAST   calcium carbonate (OSCAL) 1500 (600 Ca) MG TABS tablet Take 600 mg of elemental calcium by mouth 2 (two) times daily with a meal.   carvedilol (COREG) 6.25 MG tablet Take 0.5 tablets (3.125 mg total) by mouth 2 (two) times daily.   celecoxib (CELEBREX) 200 MG capsule TAKE 1 CAPSULE BY MOUTH  DAILY WITH FOOD   Colchicine 0.6 MG CAPS TAKE 1 CAPSULE BY MOUTH  DAILY   DULoxetine (CYMBALTA) 30 MG capsule Take 1 capsule (30 mg total) by mouth 2 (two) times daily.   ferrous sulfate 325 (65 FE) MG tablet Take 325 mg by mouth 2 (two) times daily with  a meal.   fluticasone (FLONASE) 50 MCG/ACT nasal spray USE 2 SPRAYS IN BOTH  NOSTRILS DAILY AS NEEDED  FOR ALLERGIES OR RHINITIS   furosemide (LASIX) 40 MG tablet TAKE 1 TABLET BY MOUTH  DAILY IN THE MORNING   hydroxychloroquine (PLAQUENIL) 200 MG tablet Take 1 tablet by mouth in the morning and at bedtime.   lisinopril (ZESTRIL) 20 MG tablet TAKE 1 TABLET BY MOUTH  DAILY   metFORMIN (GLUCOPHAGE-XR) 500 MG 24 hr tablet TAKE 1 TABLET BY MOUTH  DAILY WITH BREAKFAST   mometasone (ELOCON) 0.1 %  cream Apply topically daily.   Multiple Vitamin (MULTIVITAMIN) capsule Take 1 capsule by mouth daily.   neomycin-polymyxin-hydrocortisone (CORTISPORIN) 3.5-10000-1 OTIC suspension INSTILL 4 DROP INTO THE  AFFECTED EARS TWICE DAILY  FOR 10 DAYS   pantoprazole (PROTONIX) 40 MG tablet TAKE 1 TABLET BY MOUTH  DAILY   potassium chloride SA (KLOR-CON) 20 MEQ tablet TAKE 1 TABLET BY MOUTH  DAILY FOR POTASSIUM  REPLACEMENT/ SUPPLEMENT   sildenafil (REVATIO) 20 MG tablet Take 2-5 pills at once, orally, with each sexual encounter   silver sulfADIAZINE (SILVADENE) 1 % cream Apply 1 application topically daily.   tamsulosin (FLOMAX) 0.4 MG CAPS capsule TAKE 1 CAPSULE BY MOUTH AT  BEDTIME   vitamin B-12 (CYANOCOBALAMIN) 500 MCG tablet Take 500 mcg by mouth daily.   No facility-administered encounter medications on file as of 10/05/2021.    Allergies (verified) Ciprofloxacin, Definity [perflutren lipid microsphere], and Levofloxacin [levofloxacin]   History: Past Medical History:  Diagnosis Date   Cholelithiasis with cholecystitis 01/04/2012   Chronic diarrhea 04/17/2012   CKD (chronic kidney disease) stage 3, GFR 30-59 ml/min (HCC)    COPD (chronic obstructive pulmonary disease) (HCC)    DVT (deep venous thrombosis) (HCC) 04/19/2012   Essential hypertension    Gallstones    Gout    History of colonic polyps 04/10/2017   History of DVT (deep vein thrombosis)    Kidney stones    Lumbar disc disease    Neuropathy    Nonischemic cardiomyopathy (HCC)    Minor coronary atherosclerosis at cardiac catheterization 2003, LVEF initially 20% with normalization   Obesity    Orthostatic hypotension 09/09/2018   RA (rheumatoid arthritis) (HCC)    Sleep apnea    Subarachnoid hemorrhage (HCC) 09/07/2018   Subdural hematoma 09/08/2018   Syncope, vasovagal 09/07/2018   Type 2 diabetes mellitus (HCC)    Past Surgical History:  Procedure Laterality Date   BARIATRIC SURGERY  01/11/2018   CHOLECYSTECTOMY  01/18/2012    Procedure: LAPAROSCOPIC CHOLECYSTECTOMY WITH INTRAOPERATIVE CHOLANGIOGRAM;  Surgeon: Maisie Fus A. Cornett, MD;  Location: [facility];  Service: General;  Laterality: N/A;   EYE SURGERY     Film over retina was removed.   PILONIDAL CYST EXCISION  1997   UMBILICAL HERNIA REPAIR     VARICOSE VEIN SURGERY     left leg   Family History  Problem Relation Age of Onset   Hypertension Mother    Stroke Mother    Diabetes Father    Hypertension Father    Heart disease Father    Diabetes Brother    Colon cancer Neg Hx    Social History   Socioeconomic History   Marital status: Married    Spouse name: Claris Che   Number of children: 2   Years of education: Not on file   Highest education level: Some college, no degree  Occupational History   Occupation: retired  Tobacco Use   Smoking  status: Former    Types: Cigarettes    Quit date: 12/26/1994    Years since quitting: 26.7   Smokeless tobacco: Never  Vaping Use   Vaping Use: Never used  Substance and Sexual Activity   Alcohol use: No   Drug use: No   Sexual activity: Not on file  Other Topics Concern   Not on file  Social History Narrative   Lives at home with wife, and has two adult children. Still ambulatory, no cane or walker.    Works 30 hours per week at [employer]   Has [facility] benefits - goes to [place] once or twice a year for vision, hearing, screenings, vaccines, etc.   Social Determinants of Health   Financial Resource Strain: Low Risk    Difficulty of Paying Living Expenses: Not hard at all  Food Insecurity: No Food Insecurity   Worried About Programme researcher, broadcasting/film/video in the Last Year: Never true   Ran Out of Food in the Last Year: Never true  Transportation Needs: No Transportation Needs   Lack of Transportation (Medical): No   Lack of Transportation (Non-Medical): No  Physical Activity: Sufficiently Active   Days of Exercise per Week: 5 days   Minutes of Exercise per Session: 60 min  Stress: No Stress Concern Present   Feeling of  Stress : Not at all  Social Connections: Moderately Isolated   Frequency of Communication with Friends and Family: Once a week   Frequency of Social Gatherings with Friends and Family: Once a week   Attends Religious Services: Never   Database administrator or Organizations: Yes   Attends Engineer, structural: More than 4 times per year   Marital Status: Married    Tobacco Counseling Counseling given: Not Answered   Clinical Intake:  Pre-visit preparation completed: Yes  Pain : 0-10 Pain Score: 4  Pain Type: Chronic pain Pain Location: Hip Pain Orientation: Left Pain Descriptors / Indicators: Aching, Constant Pain Onset: More than a month ago Pain Frequency: Intermittent     BMI - recorded: 38.26 Nutritional Status: BMI > 30  Obese Nutritional Risks: None Diabetes: No  How often do you need to have someone help you when you read instructions, pamphlets, or other written materials from your doctor or pharmacy?: 1 - Never  Diabetic? No  Interpreter Needed?: No  Information entered by :: Marilene Vath, LPN   Activities of Daily Living In your present state of health, do you have any difficulty performing the following activities: 10/05/2021  Hearing? Y  Comment wears hearing aids  Vision? N  Difficulty concentrating or making decisions? N  Walking or climbing stairs? N  Dressing or bathing? N  Doing errands, shopping? N  Preparing Food and eating ? N  Using the Toilet? N  In the past six months, have you accidently leaked urine? N  Do you have problems with loss of bowel control? N  Managing your Medications? N  Managing your Finances? N  Housekeeping or managing your Housekeeping? N  Some recent data might be hidden    Patient Care Team: [name], MD as PCP - General (Family Medicine) Jonelle Sidle, MD as PCP - Cardiology (Cardiology) West Bali, MD (Inactive) as Consulting Physician (Gastroenterology)  Indicate any recent  Medical Services you may have received from other than [facility] providers in the past year (date may be approximate).     Assessment:   This is a routine wellness examination for [place].  Hearing/Vision  screen Hearing Screening - Comments:: Wears hearing aids - from [place] Vision Screening - Comments:: Wears rx glasses - from [place] - up to date with annual eye exams  Dietary issues and exercise activities discussed: Current Exercise Habits: The patient has a physically strenuous job, but has no regular exercise apart from work., Exercise limited by: orthopedic condition(s);cardiac condition(s)   Goals Addressed   None    Depression Screen PHQ 2/9 Scores 10/05/2021 08/03/2021 07/13/2021 08/28/2020 08/19/2020 08/11/2020 04/29/2020  PHQ - 2 Score 0 0 0 0 0 0 0  PHQ- 9 Score - - - - - - -    Fall Risk Fall Risk  10/05/2021 08/03/2021 07/13/2021 08/28/2020 08/19/2020  Falls in the past year? 0 0 0 0 1  Comment - - - - -  Number falls in past yr: 0 - - - 0  Injury with Fall? 0 - - - 0  Risk Factor Category  - - - - -  Comment - - - - -  Risk for fall due to : Orthopedic patient;Medication side effect - - - History of fall(s)  Follow up Education provided;Falls prevention discussed - - - Falls evaluation completed    FALL RISK PREVENTION PERTAINING TO THE HOME:  Any stairs in or around the home? Yes  If so, are there any without handrails? No  Home free of loose throw rugs in walkways, pet beds, electrical cords, etc? Yes  Adequate lighting in your home to reduce risk of falls? Yes   ASSISTIVE DEVICES UTILIZED TO PREVENT FALLS:  Life alert? No  Use of a cane, walker or w/c? No  Grab bars in the bathroom? No  Shower chair or bench in shower? Yes  Elevated toilet seat or a handicapped toilet? Yes   TIMED UP AND GO:  Was the test performed? No . Telephonic visit  Cognitive Function: Normal cognitive status assessed by direct observation by this Nurse Health Advisor. No abnormalities found.        6CIT Screen 04/30/2019  What Year? 0 points  What month? 0 points  What time? 0 points  Count back from 20 0 points  Months in reverse 0 points  Repeat phrase 0 points  Total Score 0    Immunizations Immunization History  Administered Date(s) Administered   Fluad Quad(high Dose 65+) 10/10/2019   Influenza Split 10/17/2011, 10/09/2012   Influenza, High Dose Seasonal PF 09/27/2018, 10/07/2019, 10/08/2019, 09/10/2020   Influenza,inj,Quad PF,6+ Mos 09/21/2017   Influenza-Unspecified 09/27/2018   Moderna Sars-Covid-2 Vaccination 02/06/2020, 03/05/2020, 10/20/2020   Pneumococcal Conjugate-13 06/17/2016, 09/27/2018   Pneumococcal Polysaccharide-23 12/23/2010, 05/08/2013   Tdap 05/08/2013   Zoster Recombinat (Shingrix) 10/20/2020   Zoster, Live 05/08/2013    TDAP status: Up to date  Flu Vaccine status: Due, Education has been provided regarding the importance of this vaccine. Advised may receive this vaccine at local pharmacy or Health Dept. Aware to provide a copy of the vaccination record if obtained from local pharmacy or Health Dept. Verbalized acceptance and understanding.  Pneumococcal vaccine status: Up to date  Covid-19 vaccine status: Completed vaccines  Qualifies for Shingles Vaccine? Yes   Zostavax completed Yes   Shingrix Completed?: No.    Education has been provided regarding the importance of this vaccine. Patient has been advised to call insurance company to determine out of pocket expense if they have not yet received this vaccine. Advised may also receive vaccine at local pharmacy or Health Dept. Verbalized acceptance and understanding.  Screening Tests Health  Maintenance  Topic Date Due   FOOT EXAM  Never done   OPHTHALMOLOGY EXAM  Never done   Zoster Vaccines- Shingrix (2 of 2) 12/15/2020   COVID-19 Vaccine (4 - Booster for Moderna series) 01/12/2021   HEMOGLOBIN A1C  02/19/2021   INFLUENZA VACCINE  07/26/2021   COLONOSCOPY (Pts 45-61yrs Insurance coverage  will need to be confirmed)  12/26/2022   TETANUS/TDAP  05/09/2023   Hepatitis C Screening  Completed   HPV VACCINES  Aged Out    Health Maintenance  Health Maintenance Due  Topic Date Due   FOOT EXAM  Never done   OPHTHALMOLOGY EXAM  Never done   Zoster Vaccines- Shingrix (2 of 2) 12/15/2020   COVID-19 Vaccine (4 - Booster for Moderna series) 01/12/2021   HEMOGLOBIN A1C  02/19/2021   INFLUENZA VACCINE  07/26/2021    Colorectal cancer screening: Type of screening: Colonoscopy. Completed 2019. Repeat every 3 years  Lung Cancer Screening: (Low Dose CT Chest recommended if Age 16-80 years, 30 pack-year currently smoking OR have quit w/in 15years.) does not qualify.  Additional Screening:  Hepatitis C Screening: does qualify; Completed 08/28/2018  Vision Screening: Recommended annual ophthalmology exams for early detection of glaucoma and other disorders of the eye. Is the patient up to date with their annual eye exam?  Yes  Who is the provider or what is the name of the office in which the patient attends [facility] exams[facility] If pt is not established with a provider, would they like to be referred to a provider to establish care? No .   Dental Screening: Recommended annual dental exams for proper oral hygiene  Community Resource Referral / Chronic Care Management: CRR required this visit?  No   CCM required this visit?  No      Plan:     I have personally reviewed and noted the following in the patient's chart:   Medical and social history Use of alcohol, tobacco or illicit drugs  Current medications and supplements including opioid prescriptions. Patient is not currently taking opioid prescriptions. Functional ability and status Nutritional status Physical activity Advanced directives List of other physicians Hospitalizations, surgeries, and ER visits in previous 12 months Vitals Screenings to include cognitive, depression, and falls Referrals and appointments  In  addition, I have reviewed and discussed with patient certain preventive protocols, quality metrics, and best practice recommendations. A written personalized care plan for preventive services as well as general preventive health recommendations were provided to patient.     [place] Constable, LPN   [id number]   Nurse Notes: None

## 2021-10-05 NOTE — Patient Instructions (Signed)
Mitchell Parker , Thank you for taking time to come for your Medicare Wellness Visit. I appreciate your ongoing commitment to your health goals. Please review the following plan we discussed and let me know if I can assist you in the future.   Screening recommendations/referrals: Colonoscopy: Done 2019 - Repeat in 3 years at the [facility] Recommended yearly ophthalmology/optometry visit for glaucoma screening and checkup Recommended yearly dental visit for hygiene and checkup  Vaccinations: Influenza vaccine: Done 09/10/2020 - Repeat annually Pneumococcal vaccine: Done 05/08/2013 & 09/27/2018 Tdap vaccine: Done 05/08/2013 - Repeat in 10 years Shingles vaccine: Zostavax done 5/14/214; First dose done 10/20/2020 - due for second Shingrix   Covid-19: Done 01/27/20, 03/05/20, & 10/20/2020  Advanced directives: Please bring a copy of your health care power of attorney and living will to the office to be added to your chart at your convenience.   Conditions/risks identified: Aim for 30 minutes of exercise or brisk walking each day, drink 6-8 glasses of water and eat lots of fruits and vegetables.   Next appointment: Follow up in one year for your annual wellness visit.   Preventive Care 68 Years and Older, Male  Preventive care refers to lifestyle choices and visits with your health care provider that can promote health and wellness. What does preventive care include? A yearly physical exam. This is also called an annual well check. Dental exams once or twice a year. Routine eye exams. Ask your health care provider how often you should have your eyes checked. Personal lifestyle choices, including: Daily care of your teeth and gums. Regular physical activity. Eating a healthy diet. Avoiding tobacco and drug use. Limiting alcohol use. Practicing safe sex. Taking low doses of aspirin every day. Taking vitamin and mineral supplements as recommended by your health care provider. What happens during an annual  well check? The services and screenings done by your health care provider during your annual well check will depend on your age, overall health, lifestyle risk factors, and family history of disease. Counseling  Your health care provider may ask you questions about your: Alcohol use. Tobacco use. Drug use. Emotional well-being. Home and relationship well-being. Sexual activity. Eating habits. History of falls. Memory and ability to understand (cognition). Work and work Astronomer. Screening  You may have the following tests or measurements: Height, weight, and BMI. Blood pressure. Lipid and cholesterol levels. These may be checked every 5 years, or more frequently if you are over 85 years old. Skin check. Lung cancer screening. You may have this screening every year starting at age 44 if you have a 30-pack-year history of smoking and currently smoke or have quit within the past 15 years. Fecal occult blood test (FOBT) of the stool. You may have this test every year starting at age 2. Flexible sigmoidoscopy or colonoscopy. You may have a sigmoidoscopy every 5 years or a colonoscopy every 10 years starting at age 52. Prostate cancer screening. Recommendations will vary depending on your family history and other risks. Hepatitis C blood test. Hepatitis B blood test. Sexually transmitted disease (STD) testing. Diabetes screening. This is done by checking your blood sugar (glucose) after you have not eaten for a while (fasting). You may have this done every 1-3 years. Abdominal aortic aneurysm (AAA) screening. You may need this if you are a current or former smoker. Osteoporosis. You may be screened starting at age 1 if you are at high risk. Talk with your health care provider about your test results, treatment options, and  if necessary, the need for more tests. Vaccines  Your health care provider may recommend certain vaccines, such as: Influenza vaccine. This is recommended every  year. Tetanus, diphtheria, and acellular pertussis (Tdap, Td) vaccine. You may need a Td booster every 10 years. Zoster vaccine. You may need this after age 15. Pneumococcal 13-valent conjugate (PCV13) vaccine. One dose is recommended after age 76. Pneumococcal polysaccharide (PPSV23) vaccine. One dose is recommended after age 19. Talk to your health care provider about which screenings and vaccines you need and how often you need them. This information is not intended to replace advice given to you by your health care provider. Make sure you discuss any questions you have with your health care provider. Document Released: 01/08/2016 Document Revised: 08/31/2016 Document Reviewed: 10/13/2015 Elsevier Interactive Patient Education  2017 [place] Prevention in the Home Falls can cause injuries. They can happen to people of all ages. There are many things you can do to make your home safe and to help prevent falls. What can I do on the outside of my home? Regularly fix the edges of walkways and driveways and fix any cracks. Remove anything that might make you trip as you walk through a door, such as a raised step or threshold. Trim any bushes or trees on the path to your home. Use bright outdoor lighting. Clear any walking paths of anything that might make someone trip, such as rocks or tools. Regularly check to see if handrails are loose or broken. Make sure that both sides of any steps have handrails. Any raised decks and porches should have guardrails on the edges. Have any leaves, snow, or ice cleared regularly. Use sand or salt on walking paths during winter. Clean up any spills in your garage right away. This includes oil or grease spills. What can I do in the bathroom? Use night lights. Install grab bars by the toilet and in the tub and shower. Do not use towel bars as grab bars. Use non-skid mats or decals in the tub or shower. If you need to sit down in the shower, use a  plastic, non-slip stool. Keep the floor dry. Clean up any water that spills on the floor as soon as it happens. Remove soap buildup in the tub or shower regularly. Attach bath mats securely with double-sided non-slip rug tape. Do not have throw rugs and other things on the floor that can make you trip. What can I do in the bedroom? Use night lights. Make sure that you have a light by your bed that is easy to reach. Do not use any sheets or blankets that are too big for your bed. They should not hang down onto the floor. Have a firm chair that has side arms. You can use this for support while you get dressed. Do not have throw rugs and other things on the floor that can make you trip. What can I do in the kitchen? Clean up any spills right away. Avoid walking on wet floors. Keep items that you use a lot in easy-to-reach places. If you need to reach something above you, use a strong step stool that has a grab bar. Keep electrical cords out of the way. Do not use floor polish or wax that makes floors slippery. If you must use wax, use non-skid floor wax. Do not have throw rugs and other things on the floor that can make you trip. What can I do with my stairs? Do not leave any  items on the stairs. Make sure that there are handrails on both sides of the stairs and use them. Fix handrails that are broken or loose. Make sure that handrails are as long as the stairways. Check any carpeting to make sure that it is firmly attached to the stairs. Fix any carpet that is loose or worn. Avoid having throw rugs at the top or bottom of the stairs. If you do have throw rugs, attach them to the floor with carpet tape. Make sure that you have a light switch at the top of the stairs and the bottom of the stairs. If you do not have them, ask someone to add them for you. What else can I do to help prevent falls? Wear shoes that: Do not have high heels. Have rubber bottoms. Are comfortable and fit you  well. Are closed at the toe. Do not wear sandals. If you use a stepladder: Make sure that it is fully opened. Do not climb a closed stepladder. Make sure that both sides of the stepladder are locked into place. Ask someone to hold it for you, if possible. Clearly mark and make sure that you can see: Any grab bars or handrails. First and last steps. Where the edge of each step is. Use tools that help you move around (mobility aids) if they are needed. These include: Canes. Walkers. Scooters. Crutches. Turn on the lights when you go into a dark area. Replace any light bulbs as soon as they burn out. Set up your furniture so you have a clear path. Avoid moving your furniture around. If any of your floors are uneven, fix them. If there are any pets around you, be aware of where they are. Review your medicines with your doctor. Some medicines can make you feel dizzy. This can increase your chance of falling. Ask your doctor what other things that you can do to help prevent falls. This information is not intended to replace advice given to you by your health care provider. Make sure you discuss any questions you have with your health care provider. Document Released: 10/08/2009 Document Revised: 05/19/2016 Document Reviewed: 01/16/2015 Elsevier Interactive Patient Education  2017 [employer].

## 2021-10-06 ENCOUNTER — Other Ambulatory Visit: Payer: Self-pay | Admitting: Family Medicine

## 2021-10-06 DIAGNOSIS — M1A00X0 Idiopathic chronic gout, unspecified site, without tophus (tophi): Secondary | ICD-10-CM

## 2021-10-06 DIAGNOSIS — N400 Enlarged prostate without lower urinary tract symptoms: Secondary | ICD-10-CM

## 2021-10-08 ENCOUNTER — Other Ambulatory Visit: Payer: Self-pay

## 2021-10-08 ENCOUNTER — Ambulatory Visit (INDEPENDENT_AMBULATORY_CARE_PROVIDER_SITE_OTHER): Payer: Medicare Other | Admitting: Family Medicine

## 2021-10-08 ENCOUNTER — Telehealth: Payer: Self-pay | Admitting: Family Medicine

## 2021-10-08 ENCOUNTER — Encounter: Payer: Self-pay | Admitting: Family Medicine

## 2021-10-08 VITALS — BP 156/61 | HR 51 | Temp 97.6°F | Ht 73.0 in | Wt 296.2 lb

## 2021-10-08 DIAGNOSIS — L0202 Furuncle of face: Secondary | ICD-10-CM

## 2021-10-08 DIAGNOSIS — R197 Diarrhea, unspecified: Secondary | ICD-10-CM | POA: Diagnosis not present

## 2021-10-08 LAB — URINALYSIS
Bilirubin, UA: NEGATIVE
Glucose, UA: NEGATIVE
Ketones, UA: NEGATIVE
Leukocytes,UA: NEGATIVE
Nitrite, UA: NEGATIVE
Protein,UA: NEGATIVE
RBC, UA: NEGATIVE
Specific Gravity, UA: 1.015 (ref 1.005–1.030)
Urobilinogen, Ur: 0.2 mg/dL (ref 0.2–1.0)
pH, UA: 5 (ref 5.0–7.5)

## 2021-10-08 MED ORDER — MINOCYCLINE HCL 100 MG PO CAPS: 100.0000 mg | ORAL_CAPSULE | Freq: Every day | ORAL | 1 refills | Status: DC

## 2021-10-08 MED ORDER — METRONIDAZOLE 500 MG PO TABS: 500.0000 mg | ORAL_TABLET | Freq: Three times a day (TID) | ORAL | 0 refills | Status: DC

## 2021-10-08 NOTE — Progress Notes (Signed)
Subjective:  Patient ID: Mitchell Maya., male    DOB: 1951-10-10  Age: 70 y.o. MRN: [id number]  CC: Cyst (neck)   HPI Mitchell PLITT Sr. presents for belly pain, bloating explosive diarrhea, soiling. Onset one or two month ago. Getting worse in spite of frequent use of pepto bismol. Excruciating pain at the lower quadrants. Gets relief with passing gas, which he is doing profusely. Hx of gastric bypass in Jan 2019. Denies fever. Appetite decreased, but no nausea or vomiting.  Patient says that this started about 2 months ago and that is about the time historically that he took some Augmentin.  Noted a small pimple on the right side of his neck at the jawline when he was shaving 5 days ago.  Since that time is gotten bigger and more sore.  He needs that looked at today.  20 years ago he had some places on his neck that had to be lanced.  Depression screen [facility] 2/9 10/08/2021 10/05/2021 08/03/2021  Decreased Interest 0 0 0  Down, Depressed, Hopeless 0 0 0  PHQ - 2 Score 0 0 0  Altered sleeping - - -  Tired, decreased energy - - -  Change in appetite - - -  Feeling bad or failure about yourself  - - -  Trouble concentrating - - -  Moving slowly or fidgety/restless - - -  Suicidal thoughts - - -  PHQ-9 Score - - -  Difficult doing work/chores - - -    History Mitchell Parker has a past medical history of Cholelithiasis with cholecystitis (01/04/2012), Chronic diarrhea (04/17/2012), CKD (chronic kidney disease) stage 3, GFR 30-59 ml/min (HCC), COPD (chronic obstructive pulmonary disease) ([place]), DVT (deep venous thrombosis) ([place]) (04/19/2012), Essential hypertension, Gallstones, Gout, History of colonic polyps (04/10/2017), History of DVT (deep vein thrombosis), Kidney stones, Lumbar disc disease, Neuropathy, Nonischemic cardiomyopathy ([place]), Obesity, Orthostatic hypotension (09/09/2018), RA (rheumatoid arthritis) ([place]), Sleep apnea, Subarachnoid hemorrhage ([place]) (09/07/2018), Subdural hematoma (09/08/2018),  Syncope, vasovagal (09/07/2018), and Type 2 diabetes mellitus ([place]).   He has a past surgical history that includes Umbilical hernia repair; Pilonidal cyst excision (1997); Varicose vein surgery; Cholecystectomy (01/18/2012); Bariatric Surgery (01/11/2018); and Eye surgery.   His family history includes Diabetes in his brother and father; Heart disease in his father; Hypertension in his father and mother; Stroke in his mother.He reports that he quit smoking about 26 years ago. His smoking use included cigarettes. He has never used smokeless tobacco. He reports that he does not drink alcohol and does not use drugs.    ROS Review of Systems  Constitutional:  Negative for chills, diaphoresis, fever and unexpected weight change.  HENT:  Negative for rhinorrhea and trouble swallowing.   Respiratory:  Negative for cough, chest tightness and shortness of breath.   Cardiovascular:  Negative for chest pain.  Gastrointestinal:  Positive for abdominal pain. Negative for abdominal distention, blood in stool, constipation, diarrhea, nausea, rectal pain and vomiting.  Genitourinary:  Negative for dysuria, flank pain and hematuria.  Musculoskeletal:  Negative for arthralgias and joint swelling.  Skin:  Positive for wound (right side of neck). Negative for rash.  Neurological:  Negative for syncope and headaches.   Objective:  BP (!) 156/61   Pulse (!) 51   Temp 97.6 F (36.4 C)   Ht 6' 1"  (1.854 m)   Wt 296 lb 3.2 oz (134.4 kg)   SpO2 100%   BMI 39.08 kg/m   BP Readings from Last 3 Encounters:  10/08/21 (!) 156/61  07/13/21 (!) 139/55  03/17/21 132/62    Wt Readings from Last 3 Encounters:  10/08/21 296 lb 3.2 oz (134.4 kg)  10/05/21 290 lb (131.5 kg)  08/03/21 289 lb 9.6 oz (131.4 kg)     Physical Exam Constitutional:      General: He is not in acute distress.    Appearance: He is well-developed.  HENT:     Head: Normocephalic and atraumatic.     Right Ear: External ear normal.      Left Ear: External ear normal.     Nose: Nose normal.  Eyes:     Conjunctiva/sclera: Conjunctivae normal.     Pupils: Pupils are equal, round, and reactive to light.  Cardiovascular:     Rate and Rhythm: Normal rate and regular rhythm.     Heart sounds: Normal heart sounds. No murmur heard. Pulmonary:     Effort: Pulmonary effort is normal. No respiratory distress.     Breath sounds: Normal breath sounds. No wheezing or rales.  Abdominal:     General: Bowel sounds are normal. There is distension.     Palpations: Abdomen is soft. There is no mass.     Tenderness: There is abdominal tenderness (mild, BLQ). There is no right CVA tenderness, left CVA tenderness, guarding or rebound.  Musculoskeletal:        General: Normal range of motion.     Cervical back: Normal range of motion and neck supple.  Skin:    General: Skin is warm and dry.     Findings: Lesion (37m raised cyst rubbery, not fluctuant. Mild erythema. inferior, right underside of  chin) present.  Neurological:     Mental Status: He is alert and oriented to person, place, and time.     Deep Tendon Reflexes: Reflexes are normal and symmetric.  Psychiatric:        Behavior: Behavior normal.        Thought Content: Thought content normal.        Judgment: Judgment normal.      Assessment & Plan:   Mitchell Parker seen today for cyst.  Diagnoses and all orders for this visit:  Diarrhea of presumed infectious origin -     GI Profile, Stool, PCR -     Giardia/Cryptosporidium EIA -     Clostridium difficile EIA -     Ova and parasite examination -     Lipase -     CBC with Differential/Platelet -     CMP14+EGFR -     Urinalysis -     Fecal occult blood, imunochemical; Future -     Ambulatory referral to Gastroenterology  Furuncle of chin  Other orders -     metroNIDAZOLE (FLAGYL) 500 MG tablet; Take 1 tablet (500 mg total) by mouth 3 (three) times daily. -     minocycline (MINOCIN) 100 MG capsule; Take 1 capsule (100  mg total) by mouth daily. Take on an empty stomach      [name] RuaSr. "bill" start on metroNIDAZOLE and minocycline. I am also having him maintain his albuterol-ipratropium, multivitamin, calcium carbonate, vitamin B-12, acetaminophen, mometasone, silver sulfADIAZINE, potassium chloride SA, pantoprazole, celecoxib, neomycin-polymyxin-hydrocortisone, amLODipine, calcitRIOL, fluticasone, hydroxychloroquine, lisinopril, ferrous sulfate, atorvastatin, carvedilol, DULoxetine, sildenafil, Colchicine, metFORMIN, tamsulosin, furosemide, and allopurinol.  Allergies as of 10/08/2021       Reactions   Ciprofloxacin Hives   Definity [perflutren Lipid Microsphere] Nausea And Vomiting   During ECHO with Definity  injection pt became nauseated and vomited    Levofloxacin [levofloxacin] Hives        Medication List        Accurate as of October 08, 2021 11:15 AM. If you have any questions, ask your nurse or doctor.          acetaminophen 500 MG tablet Commonly known as: TYLENOL Take 500 mg by mouth every 6 (six) hours as needed.   albuterol-ipratropium 18-103 MCG/ACT inhaler Commonly known as: COMBIVENT Inhale 2 puffs into the lungs every 6 (six) hours as needed. Wheezing   allopurinol 300 MG tablet Commonly known as: ZYLOPRIM TAKE 1 TABLET BY MOUTH IN  THE MORNING   amLODipine 5 MG tablet Commonly known as: NORVASC TAKE 1 TABLET BY MOUTH  DAILY FOR BLOOD PRESSURE   atorvastatin 80 MG tablet Commonly known as: LIPITOR TAKE 1 TABLET BY MOUTH  DAILY AT 6 PM   calcitRIOL 0.25 MCG capsule Commonly known as: ROCALTROL TAKE 1 CAPSULE BY MOUTH  DAILY WITH BREAKFAST   calcium carbonate 1500 (600 Ca) MG Tabs tablet Commonly known as: OSCAL Take 600 mg of elemental calcium by mouth 2 (two) times daily with a meal.   carvedilol 6.25 MG tablet Commonly known as: COREG Take 0.5 tablets (3.125 mg total) by mouth 2 (two) times daily.   celecoxib 200 MG capsule Commonly  known as: CELEBREX TAKE 1 CAPSULE BY MOUTH  DAILY WITH FOOD   Colchicine 0.6 MG Caps TAKE 1 CAPSULE BY MOUTH  DAILY   DULoxetine 30 MG capsule Commonly known as: CYMBALTA Take 1 capsule (30 mg total) by mouth 2 (two) times daily.   ferrous sulfate 325 (65 FE) MG tablet Take 325 mg by mouth 2 (two) times daily with a meal.   fluticasone 50 MCG/ACT nasal spray Commonly known as: FLONASE USE 2 SPRAYS IN BOTH  NOSTRILS DAILY AS NEEDED  FOR ALLERGIES OR RHINITIS   furosemide 40 MG tablet Commonly known as: LASIX TAKE 1 TABLET BY MOUTH  DAILY IN THE MORNING   hydroxychloroquine 200 MG tablet Commonly known as: PLAQUENIL Take 1 tablet by mouth in the morning and at bedtime.   lisinopril 20 MG tablet Commonly known as: ZESTRIL TAKE 1 TABLET BY MOUTH  DAILY   metFORMIN 500 MG 24 hr tablet Commonly known as: GLUCOPHAGE-XR TAKE 1 TABLET BY MOUTH  DAILY WITH BREAKFAST   metroNIDAZOLE 500 MG tablet Commonly known as: FLAGYL Take 1 tablet (500 mg total) by mouth 3 (three) times daily. Started by: [name], MD   minocycline 100 MG capsule Commonly known as: Minocin Take 1 capsule (100 mg total) by mouth daily. Take on an empty stomach Started by: [name], MD   mometasone 0.1 % cream Commonly known as: ELOCON Apply topically daily.   multivitamin capsule Take 1 capsule by mouth daily.   neomycin-polymyxin-hydrocortisone 3.5-10000-1 OTIC suspension Commonly known as: CORTISPORIN INSTILL 4 DROP INTO THE  AFFECTED EARS TWICE DAILY  FOR 10 DAYS   pantoprazole 40 MG tablet Commonly known as: PROTONIX TAKE 1 TABLET BY MOUTH  DAILY   potassium chloride SA 20 MEQ tablet Commonly known as: KLOR-CON TAKE 1 TABLET BY MOUTH  DAILY FOR POTASSIUM  REPLACEMENT/ SUPPLEMENT   sildenafil 20 MG tablet Commonly known as: REVATIO Take 2-5 pills at once, orally, with each sexual encounter   silver sulfADIAZINE 1 % cream Commonly known as: SILVADENE Apply 1 application  topically daily.   tamsulosin 0.4 MG Caps capsule Commonly known as: FLOMAX TAKE  1 CAPSULE BY MOUTH AT  BEDTIME   vitamin B-12 500 MCG tablet Commonly known as: CYANOCOBALAMIN Take 500 mcg by mouth daily.         Follow-up: Return in about 1 week (around 10/15/2021), or if symptoms worsen or fail to improve, for boil or diarrhea.  [name], M.D.

## 2021-10-08 NOTE — Telephone Encounter (Signed)
DONE

## 2021-10-08 NOTE — Telephone Encounter (Signed)
Please call patient to schedule 1 wk follow up with Dr Darlyn Read for recheck of boil per Dr Darlyn Read request.

## 2021-10-09 LAB — CBC WITH DIFFERENTIAL/PLATELET
Basophils Absolute: 0 10*3/uL (ref 0.0–0.2)
Basos: 1 %
EOS (ABSOLUTE): 0.3 10*3/uL (ref 0.0–0.4)
Eos: 5 %
Hematocrit: 43.5 % (ref 37.5–51.0)
Hemoglobin: 14.2 g/dL (ref 13.0–17.7)
Immature Grans (Abs): 0 10*3/uL (ref 0.0–0.1)
Immature Granulocytes: 0 %
Lymphocytes Absolute: 0.9 10*3/uL (ref 0.7–3.1)
Lymphs: 15 %
MCH: 31.5 pg (ref 26.6–33.0)
MCHC: 32.6 g/dL (ref 31.5–35.7)
MCV: 97 fL (ref 79–97)
Monocytes Absolute: 0.6 10*3/uL (ref 0.1–0.9)
Monocytes: 11 %
Neutrophils Absolute: 4 10*3/uL (ref 1.4–7.0)
Neutrophils: 68 %
Platelets: 122 10*3/uL — ABNORMAL LOW (ref 150–450)
RBC: 4.51 x10E6/uL (ref 4.14–5.80)
RDW: 14.5 % (ref 11.6–15.4)
WBC: 5.9 10*3/uL (ref 3.4–10.8)

## 2021-10-09 LAB — CMP14+EGFR
ALT: 31 IU/L (ref 0–44)
AST: 32 IU/L (ref 0–40)
Albumin/Globulin Ratio: 2 (ref 1.2–2.2)
Albumin: 4.4 g/dL (ref 3.8–4.8)
Alkaline Phosphatase: 67 IU/L (ref 44–121)
BUN/Creatinine Ratio: 19 (ref 10–24)
BUN: 28 mg/dL — ABNORMAL HIGH (ref 8–27)
Bilirubin Total: 0.5 mg/dL (ref 0.0–1.2)
CO2: 25 mmol/L (ref 20–29)
Calcium: 9 mg/dL (ref 8.6–10.2)
Chloride: 101 mmol/L (ref 96–106)
Creatinine, Ser: 1.51 mg/dL — ABNORMAL HIGH (ref 0.76–1.27)
Globulin, Total: 2.2 g/dL (ref 1.5–4.5)
Glucose: 127 mg/dL — ABNORMAL HIGH (ref 70–99)
Potassium: 4.7 mmol/L (ref 3.5–5.2)
Sodium: 144 mmol/L (ref 134–144)
Total Protein: 6.6 g/dL (ref 6.0–8.5)
eGFR: 50 mL/min/{1.73_m2} — ABNORMAL LOW (ref >59)

## 2021-10-09 LAB — GIARDIA/CRYPTOSPORIDIUM EIA
Cryptosporidium EIA: NEGATIVE
Giardia Ag, Stl: NEGATIVE

## 2021-10-09 LAB — LIPASE: Lipase: 38 U/L (ref 13–78)

## 2021-10-11 NOTE — Progress Notes (Signed)
Hello Ren, ° °Your lab result is normal and/or stable.Some minor variations that are not significant are commonly marked abnormal, but do not represent any medical problem for you. ° °Best regards, °Ree Alcalde, M.D.

## 2021-10-12 LAB — GI PROFILE, STOOL, PCR

## 2021-10-13 LAB — OVA AND PARASITE EXAMINATION

## 2021-10-14 ENCOUNTER — Ambulatory Visit: Payer: Medicare Other | Admitting: Family Medicine

## 2021-10-14 NOTE — Telephone Encounter (Signed)
Pt called stating that he received a missed call from our office yesterday and wanted to know what it was about.  Explained to pt that we were calling to confirm his appt that he was supposed to have this morning at 8:10.  Pt said he didn't know anything about the appt because no one ever called him to schedule it.  Please call pt to reschedule the appt. It is for a follow up regarding a boil on his neck.

## 2021-10-20 ENCOUNTER — Encounter: Payer: Self-pay | Admitting: Cardiology

## 2021-10-20 ENCOUNTER — Ambulatory Visit (INDEPENDENT_AMBULATORY_CARE_PROVIDER_SITE_OTHER): Payer: Medicare Other | Admitting: Cardiology

## 2021-10-20 ENCOUNTER — Other Ambulatory Visit: Payer: Self-pay

## 2021-10-20 VITALS — BP 146/68 | HR 45 | Ht 73.0 in | Wt 305.0 lb

## 2021-10-20 DIAGNOSIS — Z8679 Personal history of other diseases of the circulatory system: Secondary | ICD-10-CM | POA: Diagnosis not present

## 2021-10-20 DIAGNOSIS — E782 Mixed hyperlipidemia: Secondary | ICD-10-CM | POA: Diagnosis not present

## 2021-10-20 NOTE — Patient Instructions (Addendum)

## 2021-10-20 NOTE — Progress Notes (Signed)
Cardiology Office Note  Date: 10/20/2021   ID: Mitchell Nickson., DOB July 26, 1951, MRN [id number]  PCP:  [name], MD  Cardiologist:  [name], MD Electrophysiologist:  None   Chief Complaint  Patient presents with   Cardiac follow-up    History of Present Illness: Mitchell Plagge. is a 70 y.o. male last seen in March.  He is here for a routine visit.  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  Reports NYHA class I-II dyspnea, no exertional chest pain.  No palpitations or syncope.  Follow-up echocardiogram in April revealed normal LVEF at 60 to 65%.  I reviewed his medications which are noted below.  He reports compliance with therapy.  Does have sinus bradycardia on low-dose Coreg, although not clearly symptomatic.  I reviewed his ECG from August.  I reviewed his recent lab work as outlined below.  Past Medical History:  Diagnosis Date   Cholelithiasis with cholecystitis 01/04/2012   Chronic diarrhea 04/17/2012   CKD (chronic kidney disease) stage 3, GFR 30-59 ml/min (HCC)    COPD (chronic obstructive pulmonary disease) (HCC)    DVT (deep venous thrombosis) (HCC) 04/19/2012   Essential hypertension    Gallstones    Gout    History of colonic polyps 04/10/2017   History of DVT (deep vein thrombosis)    Kidney stones    Lumbar disc disease    Neuropathy    Nonischemic cardiomyopathy (HCC)    Minor coronary atherosclerosis at cardiac catheterization 2003, LVEF initially 20% with normalization   Obesity    Orthostatic hypotension 09/09/2018   RA (rheumatoid arthritis) (HCC)    Sleep apnea    Subarachnoid hemorrhage (HCC) 09/07/2018   Subdural hematoma 09/08/2018   Syncope, vasovagal 09/07/2018   Type 2 diabetes mellitus (HCC)     Past Surgical History:  Procedure Laterality Date   BARIATRIC SURGERY  01/11/2018   CHOLECYSTECTOMY  01/18/2012   Procedure: LAPAROSCOPIC CHOLECYSTECTOMY WITH INTRAOPERATIVE  CHOLANGIOGRAM;  Surgeon: Maisie Fus A. Cornett, MD;  Location: [facility];  Service: General;  Laterality: N/A;   EYE SURGERY     Film over retina was removed.   PILONIDAL CYST EXCISION  1997   UMBILICAL HERNIA REPAIR     VARICOSE VEIN SURGERY     left leg    Current Outpatient Medications  Medication Sig Dispense Refill   acetaminophen (TYLENOL) 500 MG tablet Take 500 mg by mouth every 6 (six) hours as needed.     albuterol-ipratropium (COMBIVENT) 18-103 MCG/ACT inhaler Inhale 2 puffs into the lungs every 6 (six) hours as needed. Wheezing       allopurinol (ZYLOPRIM) 300 MG tablet TAKE 1 TABLET BY MOUTH IN  THE MORNING 90 tablet 0   amLODipine (NORVASC) 5 MG tablet TAKE 1 TABLET BY MOUTH  DAILY FOR BLOOD PRESSURE 90 tablet 3   atorvastatin (LIPITOR) 80 MG tablet TAKE 1 TABLET BY MOUTH  DAILY AT 6 PM 90 tablet 3   calcitRIOL (ROCALTROL) 0.25 MCG capsule TAKE 1 CAPSULE BY MOUTH  DAILY WITH BREAKFAST 90 capsule 3   calcium carbonate (OSCAL) 1500 (600 Ca) MG TABS tablet Take 600 mg of elemental calcium by mouth 2 (two) times daily with a meal.     carvedilol (COREG) 6.25 MG tablet Take 0.5 tablets (3.125 mg total) by mouth 2 (two) times daily. 180 tablet 3   celecoxib (CELEBREX) 200 MG capsule TAKE 1 CAPSULE BY MOUTH  DAILY WITH  FOOD 90 capsule 3   Colchicine 0.6 MG CAPS TAKE 1 CAPSULE BY MOUTH  DAILY 90 capsule 0   DULoxetine (CYMBALTA) 30 MG capsule Take 1 capsule (30 mg total) by mouth 2 (two) times daily. 180 capsule 1   ferrous sulfate 325 (65 FE) MG tablet Take 325 mg by mouth 2 (two) times daily with a meal.     fluticasone (FLONASE) 50 MCG/ACT nasal spray USE 2 SPRAYS IN BOTH  NOSTRILS DAILY AS NEEDED  FOR ALLERGIES OR RHINITIS 48 g 2   furosemide (LASIX) 40 MG tablet TAKE 1 TABLET BY MOUTH  DAILY IN THE MORNING 90 tablet 0   hydroxychloroquine (PLAQUENIL) 200 MG tablet Take 1 tablet by mouth in the morning and at bedtime.     lisinopril (ZESTRIL) 20 MG tablet TAKE 1 TABLET BY MOUTH  DAILY 90  tablet 3   metFORMIN (GLUCOPHAGE-XR) 500 MG 24 hr tablet TAKE 1 TABLET BY MOUTH  DAILY WITH BREAKFAST 90 tablet 0   metroNIDAZOLE (FLAGYL) 500 MG tablet Take 1 tablet (500 mg total) by mouth 3 (three) times daily. 21 tablet 0   minocycline (MINOCIN) 100 MG capsule Take 1 capsule (100 mg total) by mouth daily. Take on an empty stomach 90 capsule 1   mometasone (ELOCON) 0.1 % cream Apply topically daily. 45 g 3   Multiple Vitamin (MULTIVITAMIN) capsule Take 1 capsule by mouth daily.     neomycin-polymyxin-hydrocortisone (CORTISPORIN) 3.5-10000-1 OTIC suspension INSTILL 4 DROP INTO THE  AFFECTED EARS TWICE DAILY  FOR 10 DAYS 10 mL 0   pantoprazole (PROTONIX) 40 MG tablet TAKE 1 TABLET BY MOUTH  DAILY 90 tablet 3   potassium chloride SA (KLOR-CON) 20 MEQ tablet TAKE 1 TABLET BY MOUTH  DAILY FOR POTASSIUM  REPLACEMENT/ SUPPLEMENT 90 tablet 3   sildenafil (REVATIO) 20 MG tablet Take 2-5 pills at once, orally, with each sexual encounter 50 tablet 5   silver sulfADIAZINE (SILVADENE) 1 % cream Apply 1 application topically daily.     tamsulosin (FLOMAX) 0.4 MG CAPS capsule TAKE 1 CAPSULE BY MOUTH AT  BEDTIME 90 capsule 0   vitamin B-12 (CYANOCOBALAMIN) 500 MCG tablet Take 500 mcg by mouth daily.     No current facility-administered medications for this visit.   Allergies:  Ciprofloxacin, Definity [perflutren lipid microsphere], and Levofloxacin [levofloxacin]   ROS: No orthopnea or PND.  Physical Exam: VS:  BP (!) 146/68   Pulse (!) 45   Ht 6\' 1"  (1.854 m)   Wt (!) 305 lb (138.3 kg)   SpO2 99%   BMI 40.24 kg/m , BMI Body mass index is 40.24 kg/m.  Wt Readings from Last 3 Encounters:  10/20/21 (!) 305 lb (138.3 kg)  10/08/21 296 lb 3.2 oz (134.4 kg)  10/05/21 290 lb (131.5 kg)    General: Patient appears comfortable at rest. HEENT: Conjunctiva and lids normal, wearing a mask. Neck: Supple, no elevated JVP or carotid bruits, no thyromegaly. Lungs: Clear to auscultation, nonlabored  breathing at rest. Cardiac: Regular rate and rhythm, no S3 or significant systolic murmur, no pericardial rub. Extremities: Venous stasis with mild edema, chronic.  ECG:  An ECG dated 08/03/2021 was personally reviewed today and demonstrated:  Sinus bradycardia with IVCD.  Recent Labwork: 10/08/2021: ALT 31; AST 32; BUN 28; Creatinine, Ser 1.51; Hemoglobin 14.2; Platelets 122; Potassium 4.7; Sodium 144     Component Value Date/Time   CHOL 103 08/03/2021 1017   TRIG 87 08/03/2021 1017   HDL 41 08/03/2021 1017  CHOLHDL 2.5 08/03/2021 1017   CHOLHDL 11.7 10/22/2011 1030   VLDL 39 10/22/2011 1030   LDLCALC 45 08/03/2021 1017    Other Studies Reviewed Today:  Echocardiogram 04/06/2021:  1. Left ventricular ejection fraction, by estimation, is 60 to 65%. The  left ventricle has normal function. Left ventricular endocardial border  not optimally defined to evaluate regional wall motion. Left ventricular  diastolic parameters were normal.  The average left ventricular global longitudinal strain is 21.3 %. The  global longitudinal strain is normal.   2. Right ventricular systolic function is normal. The right ventricular  size is normal.   3. Left atrial size was mildly dilated.   4. The mitral valve was not well visualized. No evidence of mitral valve  regurgitation. No evidence of mitral stenosis.   5. The aortic valve is tricuspid. There is mild calcification of the  aortic valve. There is mild thickening of the aortic valve. Aortic valve  regurgitation is not visualized. No aortic stenosis is present.   Assessment and Plan:  1.  HFrecEF.  History of nonischemic cardiomyopathy with normalization of LVEF, most recently 60 to 65% by follow-up echocardiogram.  He is doing very well in terms of symptoms on current regimen.  Continue Coreg and lisinopril.  Also on Lasix with potassium supplement.  2.  Sinus bradycardia on low-dose Coreg.  He is not clearly symptomatic.  Continue  observation.  3.  Mixed hyperlipidemia on Lipitor.  Recent LDL 39.  Medication Adjustments/Labs and Tests Ordered: Current medicines are reviewed at length with the patient today.  Concerns regarding medicines are outlined above.   Tests Ordered: No orders of the defined types were placed in this encounter.   Medication Changes: No orders of the defined types were placed in this encounter.   Disposition:  Follow up  6 months.  Signed, Jonelle Sidle, MD, [facility] 10/20/2021 10:21 AM    [facility] [address] [place], [place], [place] [address] Phone: [phone]; Fax: [phone]

## 2021-11-03 ENCOUNTER — Ambulatory Visit: Payer: Medicare Other | Admitting: Family Medicine

## 2021-12-03 ENCOUNTER — Other Ambulatory Visit: Payer: Self-pay | Admitting: Family Medicine

## 2021-12-07 ENCOUNTER — Encounter: Payer: Self-pay | Admitting: Family Medicine

## 2021-12-22 ENCOUNTER — Other Ambulatory Visit: Payer: Self-pay | Admitting: Family Medicine

## 2021-12-22 DIAGNOSIS — M1A00X0 Idiopathic chronic gout, unspecified site, without tophus (tophi): Secondary | ICD-10-CM

## 2021-12-30 ENCOUNTER — Other Ambulatory Visit: Payer: Self-pay | Admitting: Family Medicine

## 2021-12-30 DIAGNOSIS — I10 Essential (primary) hypertension: Secondary | ICD-10-CM

## 2021-12-31 ENCOUNTER — Other Ambulatory Visit: Payer: Self-pay | Admitting: Family Medicine

## 2021-12-31 DIAGNOSIS — N400 Enlarged prostate without lower urinary tract symptoms: Secondary | ICD-10-CM

## 2021-12-31 DIAGNOSIS — M1A00X0 Idiopathic chronic gout, unspecified site, without tophus (tophi): Secondary | ICD-10-CM

## 2022-01-04 DIAGNOSIS — L84 Corns and callosities: Secondary | ICD-10-CM | POA: Diagnosis not present

## 2022-01-04 DIAGNOSIS — B351 Tinea unguium: Secondary | ICD-10-CM | POA: Diagnosis not present

## 2022-01-04 DIAGNOSIS — M79676 Pain in unspecified toe(s): Secondary | ICD-10-CM | POA: Diagnosis not present

## 2022-01-04 DIAGNOSIS — E1142 Type 2 diabetes mellitus with diabetic polyneuropathy: Secondary | ICD-10-CM | POA: Diagnosis not present

## 2022-01-06 ENCOUNTER — Other Ambulatory Visit: Payer: Self-pay | Admitting: Family Medicine

## 2022-02-03 ENCOUNTER — Inpatient Hospital Stay (HOSPITAL_COMMUNITY): Payer: No Typology Code available for payment source

## 2022-02-03 ENCOUNTER — Inpatient Hospital Stay (HOSPITAL_COMMUNITY)
Admission: EM | Admit: 2022-02-03 | Discharge: 2022-02-05 | DRG: 086 | Disposition: A | Discharge status: Home / Self Care | Payer: No Typology Code available for payment source | Source: Ambulatory Visit | Attending: Surgery | Admitting: Surgery

## 2022-02-03 ENCOUNTER — Other Ambulatory Visit: Payer: Self-pay

## 2022-02-03 DIAGNOSIS — Z86010 Personal history of colonic polyps: Secondary | ICD-10-CM | POA: Diagnosis not present

## 2022-02-03 DIAGNOSIS — Z823 Family history of stroke: Secondary | ICD-10-CM | POA: Diagnosis not present

## 2022-02-03 DIAGNOSIS — R001 Bradycardia, unspecified: Secondary | ICD-10-CM | POA: Diagnosis not present

## 2022-02-03 DIAGNOSIS — Z9884 Bariatric surgery status: Secondary | ICD-10-CM | POA: Diagnosis not present

## 2022-02-03 DIAGNOSIS — R40241 Glasgow coma scale score 13-15, unspecified time: Secondary | ICD-10-CM | POA: Diagnosis present

## 2022-02-03 DIAGNOSIS — W109XXA Fall (on) (from) unspecified stairs and steps, initial encounter: Secondary | ICD-10-CM | POA: Diagnosis present

## 2022-02-03 DIAGNOSIS — R413 Other amnesia: Secondary | ICD-10-CM | POA: Diagnosis present

## 2022-02-03 DIAGNOSIS — M109 Gout, unspecified: Secondary | ICD-10-CM | POA: Diagnosis present

## 2022-02-03 DIAGNOSIS — Z87442 Personal history of urinary calculi: Secondary | ICD-10-CM | POA: Diagnosis not present

## 2022-02-03 DIAGNOSIS — Z86718 Personal history of other venous thrombosis and embolism: Secondary | ICD-10-CM

## 2022-02-03 DIAGNOSIS — Z23 Encounter for immunization: Secondary | ICD-10-CM | POA: Diagnosis not present

## 2022-02-03 DIAGNOSIS — I428 Other cardiomyopathies: Secondary | ICD-10-CM | POA: Diagnosis present

## 2022-02-03 DIAGNOSIS — G4733 Obstructive sleep apnea (adult) (pediatric): Secondary | ICD-10-CM | POA: Diagnosis present

## 2022-02-03 DIAGNOSIS — Z881 Allergy status to other antibiotic agents status: Secondary | ICD-10-CM

## 2022-02-03 DIAGNOSIS — Z8249 Family history of ischemic heart disease and other diseases of the circulatory system: Secondary | ICD-10-CM | POA: Diagnosis not present

## 2022-02-03 DIAGNOSIS — S199XXA Unspecified injury of neck, initial encounter: Secondary | ICD-10-CM | POA: Diagnosis not present

## 2022-02-03 DIAGNOSIS — S065X9A Traumatic subdural hemorrhage with loss of consciousness of unspecified duration, initial encounter: Secondary | ICD-10-CM | POA: Diagnosis not present

## 2022-02-03 DIAGNOSIS — I110 Hypertensive heart disease with heart failure: Secondary | ICD-10-CM | POA: Diagnosis present

## 2022-02-03 DIAGNOSIS — Z791 Long term (current) use of non-steroidal anti-inflammatories (NSAID): Secondary | ICD-10-CM | POA: Diagnosis not present

## 2022-02-03 DIAGNOSIS — S065X1A Traumatic subdural hemorrhage with loss of consciousness of 30 minutes or less, initial encounter: Secondary | ICD-10-CM | POA: Diagnosis present

## 2022-02-03 DIAGNOSIS — Y9301 Activity, walking, marching and hiking: Secondary | ICD-10-CM | POA: Diagnosis present

## 2022-02-03 DIAGNOSIS — J449 Chronic obstructive pulmonary disease, unspecified: Secondary | ICD-10-CM | POA: Diagnosis present

## 2022-02-03 DIAGNOSIS — I130 Hypertensive heart and chronic kidney disease with heart failure and stage 1 through stage 4 chronic kidney disease, or unspecified chronic kidney disease: Secondary | ICD-10-CM | POA: Diagnosis not present

## 2022-02-03 DIAGNOSIS — Z87891 Personal history of nicotine dependence: Secondary | ICD-10-CM | POA: Diagnosis not present

## 2022-02-03 DIAGNOSIS — I5032 Chronic diastolic (congestive) heart failure: Secondary | ICD-10-CM | POA: Diagnosis present

## 2022-02-03 DIAGNOSIS — S066X0A Traumatic subarachnoid hemorrhage without loss of consciousness, initial encounter: Secondary | ICD-10-CM | POA: Diagnosis not present

## 2022-02-03 DIAGNOSIS — Z833 Family history of diabetes mellitus: Secondary | ICD-10-CM

## 2022-02-03 DIAGNOSIS — S0001XA Abrasion of scalp, initial encounter: Secondary | ICD-10-CM | POA: Diagnosis not present

## 2022-02-03 DIAGNOSIS — M069 Rheumatoid arthritis, unspecified: Secondary | ICD-10-CM | POA: Diagnosis present

## 2022-02-03 DIAGNOSIS — Y92018 Other place in single-family (private) house as the place of occurrence of the external cause: Secondary | ICD-10-CM | POA: Diagnosis not present

## 2022-02-03 DIAGNOSIS — S0003XA Contusion of scalp, initial encounter: Secondary | ICD-10-CM | POA: Diagnosis not present

## 2022-02-03 DIAGNOSIS — S065X0A Traumatic subdural hemorrhage without loss of consciousness, initial encounter: Secondary | ICD-10-CM | POA: Diagnosis not present

## 2022-02-03 DIAGNOSIS — R55 Syncope and collapse: Secondary | ICD-10-CM | POA: Diagnosis not present

## 2022-02-03 DIAGNOSIS — R4020 Unspecified coma: Secondary | ICD-10-CM | POA: Diagnosis not present

## 2022-02-03 DIAGNOSIS — Z7984 Long term (current) use of oral hypoglycemic drugs: Secondary | ICD-10-CM | POA: Diagnosis not present

## 2022-02-03 DIAGNOSIS — S0990XA Unspecified injury of head, initial encounter: Secondary | ICD-10-CM | POA: Diagnosis not present

## 2022-02-03 DIAGNOSIS — E119 Type 2 diabetes mellitus without complications: Secondary | ICD-10-CM | POA: Diagnosis present

## 2022-02-03 DIAGNOSIS — N189 Chronic kidney disease, unspecified: Secondary | ICD-10-CM | POA: Diagnosis not present

## 2022-02-03 DIAGNOSIS — Z79899 Other long term (current) drug therapy: Secondary | ICD-10-CM | POA: Diagnosis not present

## 2022-02-03 DIAGNOSIS — Z20822 Contact with and (suspected) exposure to covid-19: Secondary | ICD-10-CM | POA: Diagnosis not present

## 2022-02-03 DIAGNOSIS — I609 Nontraumatic subarachnoid hemorrhage, unspecified: Secondary | ICD-10-CM | POA: Diagnosis not present

## 2022-02-03 DIAGNOSIS — G9389 Other specified disorders of brain: Secondary | ICD-10-CM | POA: Diagnosis not present

## 2022-02-03 DIAGNOSIS — S065XAA Traumatic subdural hemorrhage with loss of consciousness status unknown, initial encounter: Secondary | ICD-10-CM | POA: Diagnosis present

## 2022-02-03 DIAGNOSIS — I6200 Nontraumatic subdural hemorrhage, unspecified: Secondary | ICD-10-CM

## 2022-02-03 DIAGNOSIS — E1122 Type 2 diabetes mellitus with diabetic chronic kidney disease: Secondary | ICD-10-CM | POA: Diagnosis not present

## 2022-02-03 DIAGNOSIS — M47812 Spondylosis without myelopathy or radiculopathy, cervical region: Secondary | ICD-10-CM | POA: Diagnosis not present

## 2022-02-03 DIAGNOSIS — S066X1A Traumatic subarachnoid hemorrhage with loss of consciousness of 30 minutes or less, initial encounter: Secondary | ICD-10-CM | POA: Diagnosis present

## 2022-02-03 DIAGNOSIS — I6201 Nontraumatic acute subdural hemorrhage: Secondary | ICD-10-CM | POA: Diagnosis not present

## 2022-02-03 LAB — HEMOGLOBIN A1C
Hgb A1c MFr Bld: 6.7 % — ABNORMAL HIGH (ref 4.8–5.6)
Mean Plasma Glucose: 145.59 mg/dL

## 2022-02-03 LAB — HIV ANTIBODY (ROUTINE TESTING W REFLEX): HIV Screen 4th Generation wRfx: NONREACTIVE

## 2022-02-03 LAB — MRSA NEXT GEN BY PCR, NASAL: MRSA by PCR Next Gen: DETECTED — AB

## 2022-02-03 LAB — GLUCOSE, CAPILLARY: Glucose-Capillary: 166 mg/dL — ABNORMAL HIGH (ref 70–99)

## 2022-02-03 MED ORDER — METFORMIN HCL ER 500 MG PO TB24
500.0000 mg | ORAL_TABLET | Freq: Every day | ORAL | Status: DC
  Filled 2022-02-03: qty 1

## 2022-02-03 MED ORDER — FUROSEMIDE 40 MG PO TABS
40.0000 mg | ORAL_TABLET | Freq: Every morning | ORAL | Status: DC
  Administered 2022-02-04 – 2022-02-05 (×2): 40 mg via ORAL
  Filled 2022-02-03 (×2): qty 1

## 2022-02-03 MED ORDER — ORAL CARE MOUTH RINSE
15.0000 mL | Freq: Two times a day (BID) | OROMUCOSAL | Status: DC
  Administered 2022-02-03 – 2022-02-05 (×4): 15 mL via OROMUCOSAL

## 2022-02-03 MED ORDER — LISINOPRIL 20 MG PO TABS
20.0000 mg | ORAL_TABLET | Freq: Every day | ORAL | Status: DC
  Administered 2022-02-03: 20 mg via ORAL
  Filled 2022-02-03: qty 1

## 2022-02-03 MED ORDER — IPRATROPIUM-ALBUTEROL 18-103 MCG/ACT IN AERO: 2.0000 | INHALATION_SPRAY | Freq: Four times a day (QID) | RESPIRATORY_TRACT | Status: DC | PRN

## 2022-02-03 MED ORDER — ONDANSETRON 4 MG PO TBDP: 4.0000 mg | ORAL_TABLET | Freq: Four times a day (QID) | ORAL | Status: DC | PRN

## 2022-02-03 MED ORDER — MORPHINE SULFATE (PF) 4 MG/ML IV SOLN
4.0000 mg | INTRAVENOUS | Status: DC | PRN
  Administered 2022-02-03 – 2022-02-04 (×2): 4 mg via INTRAVENOUS
  Filled 2022-02-03 (×2): qty 1

## 2022-02-03 MED ORDER — AMLODIPINE BESYLATE 5 MG PO TABS
5.0000 mg | ORAL_TABLET | Freq: Every day | ORAL | Status: DC
  Administered 2022-02-03: 5 mg via ORAL
  Filled 2022-02-03: qty 1

## 2022-02-03 MED ORDER — TAMSULOSIN HCL 0.4 MG PO CAPS
0.4000 mg | ORAL_CAPSULE | Freq: Every day | ORAL | Status: DC
Start: 2022-02-03 — End: 2022-02-05
  Administered 2022-02-03 – 2022-02-04 (×2): 0.4 mg via ORAL
  Filled 2022-02-03 (×2): qty 1

## 2022-02-03 MED ORDER — SODIUM CHLORIDE 0.9 % IV SOLN: INTRAVENOUS | Status: DC

## 2022-02-03 MED ORDER — IPRATROPIUM-ALBUTEROL 0.5-2.5 (3) MG/3ML IN SOLN: 3.0000 mL | Freq: Four times a day (QID) | RESPIRATORY_TRACT | Status: DC | PRN

## 2022-02-03 MED ORDER — LISINOPRIL 20 MG PO TABS
20.0000 mg | ORAL_TABLET | Freq: Every day | ORAL | Status: DC
  Administered 2022-02-03 – 2022-02-05 (×3): 20 mg via ORAL
  Filled 2022-02-03 (×3): qty 1

## 2022-02-03 MED ORDER — CALCITRIOL 0.25 MCG PO CAPS
0.2500 ug | ORAL_CAPSULE | Freq: Every day | ORAL | Status: DC
  Administered 2022-02-04 – 2022-02-05 (×2): 0.25 ug via ORAL
  Filled 2022-02-03 (×2): qty 1

## 2022-02-03 MED ORDER — MUPIROCIN 2 % EX OINT
1.0000 "application " | TOPICAL_OINTMENT | Freq: Two times a day (BID) | CUTANEOUS | Status: DC
  Administered 2022-02-04 – 2022-02-05 (×4): 1 via NASAL
  Filled 2022-02-03: qty 22

## 2022-02-03 MED ORDER — PANTOPRAZOLE SODIUM 40 MG PO TBEC
40.0000 mg | DELAYED_RELEASE_TABLET | Freq: Every day | ORAL | Status: DC
  Administered 2022-02-03: 40 mg via ORAL
  Filled 2022-02-03: qty 1

## 2022-02-03 MED ORDER — ONDANSETRON HCL 4 MG/2ML IJ SOLN
4.0000 mg | Freq: Four times a day (QID) | INTRAMUSCULAR | Status: DC | PRN
  Administered 2022-02-04: 4 mg via INTRAVENOUS
  Filled 2022-02-03: qty 2

## 2022-02-03 MED ORDER — COLCHICINE 0.6 MG PO TABS
0.6000 mg | ORAL_TABLET | Freq: Every day | ORAL | Status: DC
  Administered 2022-02-03 – 2022-02-05 (×3): 0.6 mg via ORAL
  Filled 2022-02-03 (×3): qty 1

## 2022-02-03 MED ORDER — INSULIN ASPART 100 UNIT/ML IJ SOLN
0.0000 [IU] | Freq: Three times a day (TID) | INTRAMUSCULAR | Status: DC
  Administered 2022-02-04: 5 [IU] via SUBCUTANEOUS
  Administered 2022-02-04 – 2022-02-05 (×3): 3 [IU] via SUBCUTANEOUS
  Administered 2022-02-05: 2 [IU] via SUBCUTANEOUS

## 2022-02-03 MED ORDER — CARVEDILOL 3.125 MG PO TABS
3.1250 mg | ORAL_TABLET | Freq: Two times a day (BID) | ORAL | Status: DC
  Administered 2022-02-03 – 2022-02-04 (×2): 3.125 mg via ORAL
  Filled 2022-02-03 (×4): qty 1

## 2022-02-03 MED ORDER — OXYCODONE HCL 5 MG PO TABS
10.0000 mg | ORAL_TABLET | ORAL | Status: DC | PRN
  Administered 2022-02-03 – 2022-02-04 (×2): 10 mg via ORAL
  Filled 2022-02-03 (×2): qty 2

## 2022-02-03 MED ORDER — CLEVIDIPINE BUTYRATE 0.5 MG/ML IV EMUL
0.0000 mg/h | INTRAVENOUS | Status: DC
  Administered 2022-02-03: 2 mg/h via INTRAVENOUS
  Administered 2022-02-03 – 2022-02-04 (×2): 4 mg/h via INTRAVENOUS
  Administered 2022-02-04: 8 mg/h via INTRAVENOUS
  Filled 2022-02-03 (×4): qty 50

## 2022-02-03 MED ORDER — ACETAMINOPHEN 325 MG PO TABS
650.0000 mg | ORAL_TABLET | Freq: Four times a day (QID) | ORAL | Status: DC
  Administered 2022-02-03 – 2022-02-05 (×7): 650 mg via ORAL
  Filled 2022-02-03 (×7): qty 2

## 2022-02-03 MED ORDER — OXYCODONE HCL 5 MG PO TABS
5.0000 mg | ORAL_TABLET | ORAL | Status: DC | PRN
  Administered 2022-02-04: 5 mg via ORAL
  Filled 2022-02-03: qty 1

## 2022-02-03 MED ORDER — DULOXETINE HCL 30 MG PO CPEP
30.0000 mg | ORAL_CAPSULE | Freq: Two times a day (BID) | ORAL | Status: DC
  Administered 2022-02-03 – 2022-02-05 (×4): 30 mg via ORAL
  Filled 2022-02-03 (×4): qty 1

## 2022-02-03 MED ORDER — CHLORHEXIDINE GLUCONATE CLOTH 2 % EX PADS
6.0000 | MEDICATED_PAD | Freq: Every day | CUTANEOUS | Status: DC
  Administered 2022-02-04 – 2022-02-05 (×2): 6 via TOPICAL

## 2022-02-03 MED ORDER — ALLOPURINOL 300 MG PO TABS
300.0000 mg | ORAL_TABLET | Freq: Every morning | ORAL | Status: DC
  Administered 2022-02-04 – 2022-02-05 (×2): 300 mg via ORAL
  Filled 2022-02-03 (×2): qty 1

## 2022-02-03 MED ORDER — AMLODIPINE BESYLATE 5 MG PO TABS
5.0000 mg | ORAL_TABLET | Freq: Every day | ORAL | Status: DC
  Administered 2022-02-04 – 2022-02-05 (×2): 5 mg via ORAL
  Filled 2022-02-03 (×2): qty 1

## 2022-02-03 MED ORDER — PANTOPRAZOLE SODIUM 40 MG PO TBEC
40.0000 mg | DELAYED_RELEASE_TABLET | Freq: Every day | ORAL | Status: DC
  Administered 2022-02-03 – 2022-02-05 (×3): 40 mg via ORAL
  Filled 2022-02-03 (×3): qty 1

## 2022-02-03 MED ORDER — LEVETIRACETAM 500 MG PO TABS
500.0000 mg | ORAL_TABLET | Freq: Two times a day (BID) | ORAL | Status: DC
  Administered 2022-02-03 – 2022-02-05 (×4): 500 mg via ORAL
  Filled 2022-02-03 (×4): qty 1

## 2022-02-03 NOTE — ED Notes (Signed)
Neuro NP Mcdanial at bedside. This RN expressed concern about pt's BP not able going down to SBP 110 despite being at the max dose cleviprex.  This NP gave this RN a verbal order stated the goal is to keep his SBP under 160.

## 2022-02-03 NOTE — H&P (Signed)
Mitchell Parker. is an 71 y.o. male.   Chief Complaint: TBI HPI: 71yo M with multiple medical problems as listed below presented to [facility] ED S/P GLF.  He reports the last thing he remembers he was leaving the garage and walking up some steps to go into the house and then he cannot remember anything.  His wife brought him to [facility] for evaluation.  W/U there included CT head which showed B SDH and B SAH. He was accepted in transfer to [facility] ED for further evaluation and admission to the Trauma Service.  On arrival here, GCS 15 he complains of headache, especially behind his eyes.  He is not currently on any blood thinners.  Of note, blood pressure was very high and he was started on nicardipine at [facility].  Blood pressure remains high despite that here.  Past Medical History:  Diagnosis Date   Cholelithiasis with cholecystitis 01/04/2012   Chronic diarrhea 04/17/2012   CKD (chronic kidney disease) stage 3, GFR 30-59 ml/min (HCC)    COPD (chronic obstructive pulmonary disease) (HCC)    DVT (deep venous thrombosis) ([place]) 04/19/2012   Essential hypertension    Gallstones    Gout    History of colonic polyps 04/10/2017   History of DVT (deep vein thrombosis)    Kidney stones    Lumbar disc disease    Neuropathy    Nonischemic cardiomyopathy (HCC)    Minor coronary atherosclerosis at cardiac catheterization 2003, LVEF initially 20% with normalization   Obesity    Orthostatic hypotension 09/09/2018   RA (rheumatoid arthritis) ([place])    Sleep apnea    Subarachnoid hemorrhage ([place]) 09/07/2018   Subdural hematoma 09/08/2018   Syncope, vasovagal 09/07/2018   Type 2 diabetes mellitus ([place])     Past Surgical History:  Procedure Laterality Date   BARIATRIC SURGERY  01/11/2018   CHOLECYSTECTOMY  01/18/2012   Procedure: LAPAROSCOPIC CHOLECYSTECTOMY WITH INTRAOPERATIVE CHOLANGIOGRAM;  Surgeon: Marcello Moores A. Cornett, MD;  Location: [facility];  Service: General;  Laterality: N/A;   EYE SURGERY      Film over retina was removed.   PILONIDAL CYST EXCISION  0000000   UMBILICAL HERNIA REPAIR     VARICOSE VEIN SURGERY     left leg    Family History  Problem Relation Age of Onset   Hypertension Mother    Stroke Mother    Diabetes Father    Hypertension Father    Heart disease Father    Diabetes Brother    Colon cancer Neg Hx    Social History:  reports that he quit smoking about 27 years ago. His smoking use included cigarettes. He has never used smokeless tobacco. He reports that he does not drink alcohol and does not use drugs.  Allergies:  Allergies  Allergen Reactions   Ciprofloxacin Hives   Definity [Perflutren Lipid Microsphere] Nausea And Vomiting    During ECHO with Definity injection pt became nauseated and vomited    Levofloxacin [Levofloxacin] Hives    (Not in a hospital admission)   No results found for this or any previous visit (from the past 48 hour(s)). No results found.  Review of Systems  Constitutional:  Negative for activity change and fatigue.  HENT: Negative.    Eyes: Negative.   Respiratory:  Negative for chest tightness and shortness of breath.   Cardiovascular:  Negative for chest pain.  Gastrointestinal:  Positive for diarrhea. Negative for abdominal pain, constipation, nausea and vomiting.  Genitourinary: Negative.  Musculoskeletal: Negative.   Allergic/Immunologic: Negative.   Neurological:        See HPI, HA, LOC   There were no vitals taken for this visit. Physical Exam Constitutional:      Appearance: Normal appearance.  HENT:     Head:     Comments: Abrasion and small hematoma high occiput    Right Ear: External ear normal.     Left Ear: External ear normal.     Nose: Nose normal.     Mouth/Throat:     Mouth: Mucous membranes are moist.  Eyes:     General: No scleral icterus.    Conjunctiva/sclera: Conjunctivae normal.  Cardiovascular:     Rate and Rhythm: Normal rate and regular rhythm.     Heart sounds: Normal heart  sounds. No murmur heard. Pulmonary:     Effort: Pulmonary effort is normal. No respiratory distress.     Breath sounds: Normal breath sounds. No stridor. No wheezing or rhonchi.  Abdominal:     General: Abdomen is flat. There is no distension.     Palpations: Abdomen is soft.     Tenderness: There is no abdominal tenderness. There is no guarding or rebound.     Comments: Diastasis recti Surgical scars  Musculoskeletal:        General: Swelling present. No tenderness or deformity. Normal range of motion.     Cervical back: Normal range of motion and neck supple. No tenderness.     Comments: Discoloration B shins, some edema  Skin:    Capillary Refill: Capillary refill takes 2 to 3 seconds.  Neurological:     Mental Status: He is alert and oriented to person, place, and time.     Cranial Nerves: No cranial nerve deficit.     Comments: GCS 15  Psychiatric:        Mood and Affect: Mood normal.     Assessment/Plan Fall going up steps TBI/B SDH/B SAH - GCS 15, consult Neurosurgery (D/W them at 1705), F/U CT head in AM.  Keppra twice daily CHF - Home medications CKD stage 3 - Home medications DM - home metformin, SSI HTN - changed to Cleviprex drip, continue home medications COPD - Home medications HX DVT  Admit to Trauma Service, intensive care unit Advanced medical decision making. Zenovia Jarred, MD 02/03/2022, 4:38 PM

## 2022-02-03 NOTE — Consult Note (Addendum)
Reason for Consult:TBI Referring Physician: Georganna Skeans, MD   HPI: Mitchell VOZZA Sr. is a 71 y.o. male with a multifarious medical history that is significant for previous SDH and SAH in 2019, DM2, HTN, h/o DVT, CKD3, COPD who presented to the [facility] ED via private vehicle after sustaining a ground level fall. He is amnestic to the fall and stated the last thing he remembers is walking into his house.  While at the [facility] ED, a CT head was obtained and revealed bilateral SDH and bilateral SAH. He was transferred to [facility] for continued evaluation and management. Due to the findings on his CT head, NSX consult was requested. On arrival to [facility], the patient's GCS is 15. He was hypertensive and started on a Cardene gtt prior to arrival. He has complaints of a frontal headache that he rates at a 5-6/10. He denies any focal neurological deficits. He denies anticoagulation use.   Past Medical History:  Diagnosis Date   Cholelithiasis with cholecystitis 01/04/2012   Chronic diarrhea 04/17/2012   CKD (chronic kidney disease) stage 3, GFR 30-59 ml/min (HCC)    COPD (chronic obstructive pulmonary disease) (HCC)    DVT (deep venous thrombosis) ([place]) 04/19/2012   Essential hypertension    Gallstones    Gout    History of colonic polyps 04/10/2017   History of DVT (deep vein thrombosis)    Kidney stones    Lumbar disc disease    Neuropathy    Nonischemic cardiomyopathy (HCC)    Minor coronary atherosclerosis at cardiac catheterization 2003, LVEF initially 20% with normalization   Obesity    Orthostatic hypotension 09/09/2018   RA (rheumatoid arthritis) ([place])    Sleep apnea    Subarachnoid hemorrhage ([place]) 09/07/2018   Subdural hematoma 09/08/2018   Syncope, vasovagal 09/07/2018   Type 2 diabetes mellitus ([place])     Past Surgical History:  Procedure Laterality Date   BARIATRIC SURGERY  01/11/2018   CHOLECYSTECTOMY  01/18/2012   Procedure: LAPAROSCOPIC CHOLECYSTECTOMY WITH INTRAOPERATIVE  CHOLANGIOGRAM;  Surgeon: Marcello Moores A. Cornett, MD;  Location: [facility];  Service: General;  Laterality: N/A;   EYE SURGERY     Film over retina was removed.   PILONIDAL CYST EXCISION  0000000   UMBILICAL HERNIA REPAIR     VARICOSE VEIN SURGERY     left leg    Family History  Problem Relation Age of Onset   Hypertension Mother    Stroke Mother    Diabetes Father    Hypertension Father    Heart disease Father    Diabetes Brother    Colon cancer Neg Hx     Social History:  reports that he quit smoking about 27 years ago. His smoking use included cigarettes. He has never used smokeless tobacco. He reports that he does not drink alcohol and does not use drugs.  Allergies:  Allergies  Allergen Reactions   Ciprofloxacin Hives   Definity [Perflutren Lipid Microsphere] Nausea And Vomiting    During ECHO with Definity injection pt became nauseated and vomited    Levofloxacin [Levofloxacin] Hives    Medications: I have reviewed the patient's current medications.  No results found for this or any previous visit (from the past 48 hour(s)).  No results found.  ROS: Per HPI Blood pressure (!) 174/79, pulse (!) 55, resp. rate 15, SpO2 100 %. Physical Exam: Patient is awake, A/O X 4, conversant, and in good spirits. They are in NAD. SBP in the 160's, on Cleviprex gtt.  Thought content appropriate.  Speech appropriate and fluent without evidence of aphasia. Able to follow 3 step commands without difficulty.  No dysarthria present.[name] MAEW with good strength that is symmetric bilaterally. 5/5 BUE/BLE. Sensation to light touch is intact. PERLA, EOMI. CNs grossly intact.     Assessment/Plan: 71 y.o. male who presented to the ED following a ground level fall with head trauma. CT head revealed bilateral acute subdural hematomas and mild subarachnoid hemorrhage, left greater than right. No MLS or mass effect. The patient's only complaint is of a moderate frontal headache. His neurological examination is at  his baseline. No acute neurosurgical intervention is indicated at this time. Recommend medical management.    -Keppra 500 mg BID X 7 days -Repeat CT head in the morning -ICU for antihypertensive gtt -Frequent neuro checks -Maintain SBP < [place], [place], NP-C 02/03/2022, 5:02 PM    Pt seen and examined.  I formalized the above assessment and plan.

## 2022-02-03 NOTE — ED Triage Notes (Signed)
Pt BIB [facility] from [facility] d/t subarachnoid hemorrhage and subdural hematoma. Per carelink , pt fell off stairs this morning, does not recall the event. Not on anticoagulation. Hx HTN and diabetes.  Pt on 15mg  nicardipine, but blood pressure remains high.

## 2022-02-03 NOTE — ED Provider Notes (Signed)
[facility] EMERGENCY DEPARTMENT  Provider Note  CSN: [id number] Arrival date & time: 02/03/22 1641  History No chief complaint on file.   Mitchell Libert. is a 71 y.o. male brought to the ED as a transfer to the Trauma Service from [place] after a fall around lunchtime today, amnestic to the event. Found to have SDH at OSH and transferred here for further management. He is not on anticoagulation. He reports some pain behind his eyes, but otherwise feels well. Currently on nicardipine drip without improved BPs.    Home Medications Prior to Admission medications   Medication Sig Start Date End Date Taking? Authorizing Provider  acetaminophen (TYLENOL) 500 MG tablet Take 500 mg by mouth every 6 (six) hours as needed.    [provider]  albuterol-ipratropium (COMBIVENT) 18-103 MCG/ACT inhaler Inhale 2 puffs into the lungs every 6 (six) hours as needed. Wheezing      [provider]  allopurinol (ZYLOPRIM) 300 MG tablet TAKE 1 TABLET BY MOUTH IN  THE MORNING 12/31/21   [name], MD  amLODipine (NORVASC) 5 MG tablet TAKE 1 TABLET BY MOUTH  DAILY FOR BLOOD PRESSURE 12/31/21   [name], MD  atorvastatin (LIPITOR) 80 MG tablet TAKE 1 TABLET BY MOUTH  DAILY AT 6 PM 08/03/21   [name], MD  calcitRIOL (ROCALTROL) 0.25 MCG capsule TAKE 1 CAPSULE BY MOUTH  DAILY WITH BREAKFAST 01/07/22   [name], MD  calcium carbonate (OSCAL) 1500 (600 Ca) MG TABS tablet Take 600 mg of elemental calcium by mouth 2 (two) times daily with a meal.    [provider]  carvedilol (COREG) 6.25 MG tablet Take 0.5 tablets (3.125 mg total) by mouth 2 (two) times daily. 08/03/21   [name], MD  celecoxib (CELEBREX) 200 MG capsule TAKE 1 CAPSULE BY MOUTH  DAILY WITH FOOD 12/06/21   [name], MD  Colchicine 0.6 MG CAPS TAKE 1 CAPSULE BY MOUTH  DAILY 12/22/21   [name], MD  DULoxetine (CYMBALTA) 30 MG capsule TAKE 1 CAPSULE BY MOUTH  TWICE DAILY  12/22/21   [name], MD  ferrous sulfate 325 (65 FE) MG tablet Take 325 mg by mouth 2 (two) times daily with a meal.    [provider]  fluticasone (FLONASE) 50 MCG/ACT nasal spray USE 2 SPRAYS IN BOTH  NOSTRILS DAILY AS NEEDED  FOR ALLERGIES OR RHINITIS 02/01/21   [name], MD  furosemide (LASIX) 40 MG tablet TAKE 1 TABLET BY MOUTH  DAILY IN THE MORNING 12/31/21   [name], MD  hydroxychloroquine (PLAQUENIL) 200 MG tablet Take 1 tablet by mouth in the morning and at bedtime. 10/23/20   [provider]  lisinopril (ZESTRIL) 20 MG tablet TAKE 1 TABLET BY MOUTH  DAILY 07/21/21   Jonelle Sidle, MD  metFORMIN (GLUCOPHAGE-XR) 500 MG 24 hr tablet TAKE 1 TABLET BY MOUTH  DAILY WITH BREAKFAST 10/04/21   [name], MD  metroNIDAZOLE (FLAGYL) 500 MG tablet Take 1 tablet (500 mg total) by mouth 3 (three) times daily. 10/08/21   [name], MD  mometasone (ELOCON) 0.1 % cream Apply topically daily. 09/03/19   [name], MD  Multiple Vitamin (MULTIVITAMIN) capsule Take 1 capsule by mouth daily.    [provider]  neomycin-polymyxin-hydrocortisone (CORTISPORIN) 3.5-10000-1 OTIC suspension INSTILL 4 DROP INTO THE  AFFECTED EARS TWICE DAILY  FOR 10 DAYS 01/19/21   [name], MD  pantoprazole (PROTONIX) 40 MG tablet TAKE 1 TABLET BY  MOUTH  DAILY 12/06/21   [name], MD  potassium chloride SA (KLOR-CON M) 20 MEQ tablet TAKE 1 TABLET BY MOUTH  DAILY FOR POTASSIUM  REPLACEMENT/ SUPPLEMENT 12/06/21   [name], MD  sildenafil (REVATIO) 20 MG tablet Take 2-5 pills at once, orally, with each sexual encounter 08/04/21   [name], MD  silver sulfADIAZINE (SILVADENE) 1 % cream Apply 1 application topically daily.    [provider]  tamsulosin (FLOMAX) 0.4 MG CAPS capsule TAKE 1 CAPSULE BY MOUTH AT  BEDTIME 12/31/21   [name], MD  vitamin B-12 (CYANOCOBALAMIN) 500 MCG tablet Take 500 mcg by mouth daily.    [provider]      Allergies    Ciprofloxacin, Definity [perflutren lipid microsphere], and Levofloxacin [levofloxacin]   Review of Systems   Review of Systems Please see HPI for pertinent positives and negatives  Physical Exam BP (!) 171/70    Pulse 60    Resp 15    SpO2 99%   Physical Exam Vitals and nursing note reviewed.  Constitutional:      Appearance: Normal appearance.  HENT:     Head: Normocephalic and atraumatic.     Nose: Nose normal.     Mouth/Throat:     Mouth: Mucous membranes are moist.  Eyes:     Extraocular Movements: Extraocular movements intact.     Conjunctiva/sclera: Conjunctivae normal.     Pupils: Pupils are equal, round, and reactive to light.  Cardiovascular:     Rate and Rhythm: Normal rate.  Pulmonary:     Effort: Pulmonary effort is normal.     Breath sounds: Normal breath sounds.  Abdominal:     General: Abdomen is flat.     Palpations: Abdomen is soft.     Tenderness: There is no abdominal tenderness.  Musculoskeletal:        General: No swelling. Normal range of motion.     Cervical back: Neck supple.  Skin:    General: Skin is warm and dry.     Comments: Chronic bluish discoloration of BLE (since being wrapped for swelling 10+ years ago).   Neurological:     General: No focal deficit present.     Mental Status: He is alert and oriented to person, place, and time.     Cranial Nerves: No cranial nerve deficit.     Sensory: No sensory deficit.     Motor: No weakness.  Psychiatric:        Mood and Affect: Mood normal.    ED Results / Procedures / Treatments   EKG None  Procedures Procedures  Medications Ordered in the ED Medications - No data to display  Initial Impression and Plan  Patient here as a transfer from [facility]. Dr. Janee Morn, Trauma Surgery, at bedside on patient arrival and will admit. Switch to Cleviprex.   ED Course       MDM Rules/Calculators/A&P Medical Decision Making Problems Addressed: Subdural hemorrhage [facility]):  acute illness or injury that poses a threat to life or bodily functions  Amount and/or Complexity of Data Reviewed External Data Reviewed: labs, radiology and notes.    Details: from [facility]  Risk Decision regarding hospitalization.    Final Clinical Impression(s) / ED Diagnoses Final diagnoses:  Subdural hemorrhage [facility])    Rx / DC Orders ED Discharge Orders     None        Pollyann Savoy, MD 02/03/22 [phone]

## 2022-02-04 LAB — CBC
HCT: 41.1 % (ref 39.0–52.0)
Hemoglobin: 14.1 g/dL (ref 13.0–17.0)
MCH: 33.3 pg (ref 26.0–34.0)
MCHC: 34.3 g/dL (ref 30.0–36.0)
MCV: 96.9 fL (ref 80.0–100.0)
Platelets: 124 10*3/uL — ABNORMAL LOW (ref 150–400)
RBC: 4.24 MIL/uL (ref 4.22–5.81)
RDW: 14 % (ref 11.5–15.5)
WBC: 6.2 10*3/uL (ref 4.0–10.5)
nRBC: 0 % (ref 0.0–0.2)

## 2022-02-04 LAB — BASIC METABOLIC PANEL
Anion gap: 8 (ref 5–15)
BUN: 22 mg/dL (ref 8–23)
CO2: 23 mmol/L (ref 22–32)
Calcium: 8.7 mg/dL — ABNORMAL LOW (ref 8.9–10.3)
Chloride: 109 mmol/L (ref 98–111)
Creatinine, Ser: 1.29 mg/dL — ABNORMAL HIGH (ref 0.61–1.24)
GFR, Estimated: >60 mL/min (ref >60)
Glucose, Bld: 164 mg/dL — ABNORMAL HIGH (ref 70–99)
Potassium: 4.6 mmol/L (ref 3.5–5.1)
Sodium: 140 mmol/L (ref 135–145)

## 2022-02-04 LAB — GLUCOSE, CAPILLARY
Glucose-Capillary: 160 mg/dL — ABNORMAL HIGH (ref 70–99)
Glucose-Capillary: 154 mg/dL — ABNORMAL HIGH (ref 70–99)
Glucose-Capillary: 210 mg/dL — ABNORMAL HIGH (ref 70–99)
Glucose-Capillary: 136 mg/dL — ABNORMAL HIGH (ref 70–99)
Glucose-Capillary: 136 mg/dL — ABNORMAL HIGH (ref 70–99)

## 2022-02-04 MED ORDER — HYDRALAZINE HCL 20 MG/ML IJ SOLN
10.0000 mg | INTRAMUSCULAR | Status: DC | PRN
  Administered 2022-02-05: 10 mg via INTRAVENOUS
  Filled 2022-02-04: qty 1

## 2022-02-04 MED ORDER — HYDROMORPHONE HCL 1 MG/ML IJ SOLN: 0.5000 mg | INTRAMUSCULAR | Status: DC | PRN

## 2022-02-04 MED ORDER — METHOCARBAMOL 750 MG PO TABS
750.0000 mg | ORAL_TABLET | Freq: Three times a day (TID) | ORAL | Status: DC
  Administered 2022-02-04 (×3): 750 mg via ORAL
  Filled 2022-02-04 (×2): qty 1
  Filled 2022-02-04: qty 2
  Filled 2022-02-04: qty 1

## 2022-02-04 NOTE — Progress Notes (Signed)
Telemetry called RN that pt. had a run of bigeminy PVC  at 2234

## 2022-02-04 NOTE — Progress Notes (Signed)
Neurosurgery  Pt awake, alert, Ox3 FC x 4, no drift.  C/o some occipital HA. CT head stable  S/p head trauma with multiple small tSAH, small contusions - ok to downgrade - Keppra x 7 days - ok for pharmacologic DVT prophylaxis

## 2022-02-04 NOTE — Evaluation (Addendum)
Speech Language Pathology Evaluation Patient Details Name: Mitchell Parker. MRN: [id number] DOB: 1951/04/23 Today's Date: 02/04/2022 Time: [id number] SLP Time Calculation (min) (ACUTE ONLY): 18 min  Problem List:  Patient Active Problem List   Diagnosis Date Noted   SDH (subdural hematoma) 02/03/2022   Right ear pain 07/13/2021   Venous insufficiency of right leg 08/19/2020   Thrombocytopenia (HCC) 09/08/2018   Chronic diastolic congestive heart failure ([place]) 08/17/2017   Hypokalemia 05/23/2012   Rheumatoid aortitis 05/23/2012   Pulmonary embolism ([place]) 04/19/2012   Chronic renal insufficiency 04/17/2012   Obesity, morbid ([place]) 01/16/2012   CHF (congestive heart failure) ([place]) 01/16/2012   COPD (chronic obstructive pulmonary disease) ([place]) 01/16/2012   OSA on CPAP 01/16/2012   Gout 01/04/2012   Hypertension 01/04/2012   Past Medical History:  Past Medical History:  Diagnosis Date   Cholelithiasis with cholecystitis 01/04/2012   Chronic diarrhea 04/17/2012   CKD (chronic kidney disease) stage 3, GFR 30-59 ml/min (HCC)    COPD (chronic obstructive pulmonary disease) (HCC)    DVT (deep venous thrombosis) ([place]) 04/19/2012   Essential hypertension    Gallstones    Gout    History of colonic polyps 04/10/2017   History of DVT (deep vein thrombosis)    Kidney stones    Lumbar disc disease    Neuropathy    Nonischemic cardiomyopathy (HCC)    Minor coronary atherosclerosis at cardiac catheterization 2003, LVEF initially 20% with normalization   Obesity    Orthostatic hypotension 09/09/2018   RA (rheumatoid arthritis) ([place])    Sleep apnea    Subarachnoid hemorrhage ([place]) 09/07/2018   Subdural hematoma 09/08/2018   Syncope, vasovagal 09/07/2018   Type 2 diabetes mellitus [facility])    Past Surgical History:  Past Surgical History:  Procedure Laterality Date   BARIATRIC SURGERY  01/11/2018   CHOLECYSTECTOMY  01/18/2012   Procedure: LAPAROSCOPIC CHOLECYSTECTOMY WITH INTRAOPERATIVE  CHOLANGIOGRAM;  Surgeon: Marcello Moores A. Cornett, MD;  Location: [facility] ORS;  Service: General;  Laterality: N/A;   EYE SURGERY     Film over retina was removed.   PILONIDAL CYST EXCISION  0000000   UMBILICAL HERNIA REPAIR     VARICOSE VEIN SURGERY     left leg   HPI:  Mitchell Parker is a 71 y.o. male who presented to the ED 2/9 following a ground level fall with head trauma. GCS 15 on arrival. CT head revealed bilateral acute subdural hematomas and mild subarachnoid hemorrhage, left greater than right. Neurosurgery consulted, but intervention not indicated. PMH: SDH/SAH, HTN, DMII, DVT, COPD, CKD3.   Assessment / Plan / Recommendation Clinical Impression  Mitchell Parker participated in speech/language/cognition evaluation. He denied any acute changes in these areas. Mitchell Parker stated that he has some minor difficulty with memory at baseline. Motor speech and language skills are currently WNL. The [facility] Mental Status Examination was completed to evaluate the Mitchell Parker's cognitive-linguistic skills. He achieved a score of 25/30 which is below the normal limits of 27 or more out of 30 and is suggestive of a mild impairment. He exhibited difficulty in the areas of memory, and executive function which Mitchell Parker reports is at baseline. Informal cognitive-linguistic assessment was [facility]. Further acute skilled SLP services are not clinically indicated at this time.    SLP Assessment  SLP Recommendation/Assessment: Patient does not need any further Speech Lanaguage Pathology Services SLP Visit Diagnosis: Cognitive communication deficit (R41.841)    Recommendations for follow up therapy are one component of a multi-disciplinary discharge planning process, led by  the attending physician.  Recommendations may be updated based on patient status, additional functional criteria and insurance authorization.    Follow Up Recommendations  No SLP follow up    Assistance Recommended at Discharge  None  Functional Status Assessment Patient has not had a  recent decline in their functional status  Frequency and Duration           SLP Evaluation Cognition  Overall Cognitive Status: Within Functional Limits for tasks assessed (but some impairments noted as outlined) Arousal/Alertness: Awake/alert Orientation Level: Oriented X4 Year: 2023 Month: February Day of Week: Correct Attention: Focused;Sustained Focused Attention: Appears intact Sustained Attention: Appears intact Memory: Impaired Memory Impairment:  (Immediate: 5/5 with repetition; delayed: 3/5) Awareness: Appears intact Problem Solving: Appears intact (Money: 3/3) Executive Function: Reasoning;Sequencing;Organizing Reasoning: Appears intact Sequencing: Impaired Sequencing Impairment: Verbal complex (clock drawing: 2/4) Organizing: Appears intact (backward digit span: 2/2)       Comprehension  Auditory Comprehension Overall Auditory Comprehension: Appears within functional limits for tasks assessed Yes/No Questions: Within Functional Limits Commands: Within Functional Limits Conversation: Complex    Expression Expression Primary Mode of Expression: Verbal Verbal Expression Overall Verbal Expression: Appears within functional limits for tasks assessed Initiation: No impairment Level of Generative/Spontaneous Verbalization: Conversation Repetition: No impairment Naming: No impairment Pragmatics: No impairment   Oral / Motor  Oral Motor/Sensory Function Overall Oral Motor/Sensory Function: Within functional limits Motor Speech Overall Motor Speech: Appears within functional limits for tasks assessed Respiration: Within functional limits Phonation: Normal Resonance: Within functional limits Articulation: Within functional limitis Intelligibility: Intelligible Motor Planning: Witnin functional limits Motor Speech Errors: Not applicable           Mitchell Parker, [place], [place] Office number [phone] Pager  [place] 02/04/2022, 4:34 PM

## 2022-02-04 NOTE — Progress Notes (Signed)
Pt ambulated to 4N-06 with myself and Rich, OT. Tolerated well. Pt placed in chair by OT and placed on unit monitor. Bedside report given. Wife notified of transfer. Care transferred.

## 2022-02-04 NOTE — TOC CAGE-AID Note (Signed)
Transition of Care (TOC) - CAGE-AID Screening   Patient Details  Name: Mitchell CAFFEE Sr. MRN: [id number] Date of Birth: January 15, 1951  Transition of Care [facility]) CM/SW Contact:    Delberta Folts C Tarpley-Carter, LCSWA Phone Number: 02/04/2022, 9:03 AM   Clinical Narrative: Pt participated in Cage-Aid.  Pt stated he does not use substance or ETOH.  Pt was not offered resources, due to no usage of substance or ETOH.    Mackenze Grandison Tarpley-Carter, MSW, LCSW-A Pronouns:  She/Her/Hers [place] Transitions of Care Clinical Social Worker Direct Number:  [phone] [email or web address]   CAGE-AID Screening:    Have You Ever Felt You Ought to Cut Down on Your Drinking or Drug Use?: No Have People Annoyed You By [employer] Your Drinking Or Drug Use?: No Have You Felt Bad Or Guilty About Your Drinking Or Drug Use?: No Have You Ever Had a Drink or Used Drugs First Thing In The Morning to Steady Your Nerves or to Get Rid of a Hangover?: No CAGE-AID Score: 0  Substance Abuse Education Offered: No

## 2022-02-04 NOTE — Progress Notes (Signed)
Subjective: Patient reports nausea and posterior head pain. Frontal headaches have resolved. No acute events overnight.   Objective: Vital signs in last 24 hours: Temp:  [97.6 F (36.4 C)-99 F (37.2 C)] 99 F (37.2 C) (02/10 0400) Pulse Rate:  [51-78] 65 (02/10 0600) Resp:  [13-21] 18 (02/10 0600) BP: (131-174)/(52-138) 139/61 (02/10 0545) SpO2:  [91 %-100 %] 94 % (02/10 0600) Weight:  [133.8 kg] 133.8 kg (02/09 2100)  Intake/Output from previous day: 02/09 0701 - 02/10 0700 In: 146.1 [I.V.:146.1] Out: 925 [Urine:925] Intake/Output this shift: Total I/O In: 138.6 [I.V.:138.6] Out: 925 [Urine:925]  Physical Exam: Patient is awake, A/O X 4, conversant, and in good spirits. They are in NAD. SBP in the 150's. Cleviprex gtt currently off. Thought content appropriate.  Speech appropriate and fluent without evidence of aphasia. Follows all commands without difficulty.  No dysarthria present.[name] MAEW with good strength that is symmetric bilaterally. 5/5 BUE/BLE. Sensation to light touch is intact. PERLA, EOMI. CNs grossly intact.   Lab Results: No results for input(s): WBC, HGB, HCT, PLT in the last 72 hours. BMET No results for input(s): NA, K, CL, CO2, GLUCOSE, BUN, CREATININE, CALCIUM in the last 72 hours.  Studies/Results: CT HEAD WO CONTRAST ( )  Result Date: 02/03/2022 CLINICAL DATA:  Initial evaluation for acute trauma, fall. Known intracranial hemorrhage. EXAM: CT HEAD WITHOUT CONTRAST TECHNIQUE: Contiguous axial images were obtained from the base of the skull through the vertex without intravenous contrast. RADIATION DOSE REDUCTION: This exam was performed according to the departmental dose-optimization program which includes automated exposure control, adjustment of the mA and/or kV according to patient size and/or use of iterative reconstruction technique. COMPARISON:  Prior CT from earlier the same day. FINDINGS: Brain: Again seen is scattered small volume subarachnoid  hemorrhage involving the bilateral cerebral hemispheres, overall slightly more conspicuous as compared to prior exam. Acute bilateral subdural hematomas again seen as well. Collection on the left is slightly larger measuring up to 7 mm, previously 8 mm in maximal thickness. Collection on the right measures up to 5 mm, little interval change from prior. Slight extension along the falx and tentorium. No appreciable intraparenchymal or intraventricular hemorrhage. No midline shift or significant mass effect. No hydrocephalus. No acute or interval infarction. Vascular: No hyperdense vessel. Skull: Suspected posterior scalp contusion.  Calvarium intact. Sinuses/Orbits: Globes and orbital soft tissues demonstrate no acute finding. Scattered mucosal thickening noted within the ethmoidal air cells. Mastoid air cells are clear. Other: None. IMPRESSION: 1. Acute bilateral subdural hematomas, slightly decreased in size on the left, and little interval changed on the right. No significant mass effect or midline shift. 2. Scattered small volume subarachnoid hemorrhage involving the bilateral cerebral hemispheres, overall slightly increased/more conspicuous as compared to prior exam. 3. Probable posterior scalp contusion. No calvarial fracture. 4. No other acute or interval intracranial abnormality. Electronically Signed   By: Rise Mu M.D.   On: 02/03/2022 20:25    Assessment/Plan: 71 y.o. male who presented to the ED following a ground level fall with head trauma. CT head revealed bilateral acute subdural hematomas and mild subarachnoid hemorrhage, left greater than right. No MLS or mass effect. The patient had complaints of a moderate frontal headache that has resolved. He has pain in his posterior head at the site where he struck his head on the ground. His neurological examination is at his baseline and unchanged. Follow up CT showed the expected progression without any new concerning features. No acute  neurosurgical intervention is indicated at  this time. Recommend continued medical management.    -Ok to transfer out of ICU from NSX perspective -Keppra 500 mg BID X 7 days -Frequent neuro checks -Maintain SBP < 160 -PT/OT/SLP   LOS: 1 day     Council MechanicJoshua L Javar Eshbach, DNP, NP-C 02/04/2022, 6:42 AM

## 2022-02-04 NOTE — Discharge Summary (Signed)
Patient ID: Mitchell Parker [id number] 10-14-1951 71 y.o.  Admit date: 02/03/2022 Discharge date: 02/05/2022  Admitting Diagnosis: Fall going up steps TBI/B SDH/B [facility]  CHF  CKD stage 3  DM  HTN  COPD  HX DVT  Discharge Diagnosis Patient Active Problem List   Diagnosis Date Noted   SDH (subdural hematoma) 02/03/2022   Right ear pain 07/13/2021   Venous insufficiency of right leg 08/19/2020   Thrombocytopenia (HCC) 09/08/2018   Chronic diastolic congestive heart failure (HCC) 08/17/2017   Hypokalemia 05/23/2012   Rheumatoid aortitis 05/23/2012   Pulmonary embolism (HCC) 04/19/2012   Chronic renal insufficiency 04/17/2012   Obesity, morbid (HCC) 01/16/2012   CHF (congestive heart failure) (HCC) 01/16/2012   COPD (chronic obstructive pulmonary disease) (HCC) 01/16/2012   OSA on CPAP 01/16/2012   Gout 01/04/2012   Hypertension 01/04/2012  Fall going up steps TBI/B SDH/B SAH  CHF  CKD stage 3  DM  HTN  COPD  HX DVT  Consultants Dr. Hoyt Koch, NSGY  Reason for Admission: 70yo M with multiple medical problems as listed below presented to [facility] ED S/P GLF.  He reports the last thing he remembers he was leaving the garage and walking up some steps to go into the house and then he cannot remember anything.  His wife brought him to [facility] for evaluation.  W/U there included CT head which showed B SDH and B SAH. He was accepted in transfer to [facility] ED for further evaluation and admission to the Trauma Service.  On arrival here, GCS 15 he complains of headache, especially behind his eyes.  He is not currently on any blood thinners.  Of note, blood pressure was very high and he was started on nicardipine at [facility].  Blood pressure remains high despite that here.  Procedures none  Hospital Course:  The patient was admitted initially to the ICU for monitoring given his TBI.  NSGY was consulted who recommended conservative management.  He underwent  a repeat head CT which revealed overall stability in his injuries.  He worked with TBI therapies who recommended no follow up for PT/OT. He did have HAs and some nausea and vomiting secondary to his TBI.  Outpatient referral was made to the TBI clinic.  He was stable on HD 2 for discharge home as his symptoms improved and he was able to tolerate a diet.   Allergies as of 02/05/2022       Reactions   Ciprofloxacin Hives   Definity [perflutren Lipid Microsphere] Nausea And Vomiting   During ECHO with Definity injection pt became nauseated and vomited    Levofloxacin [levofloxacin] Hives        Medication List     TAKE these medications    acetaminophen 500 MG tablet Commonly known as: TYLENOL Take 500 mg by mouth every 6 (six) hours as needed for mild pain.   allopurinol 300 MG tablet Commonly known as: ZYLOPRIM TAKE 1 TABLET BY MOUTH IN  THE MORNING What changed: when to take this   amLODipine 5 MG tablet Commonly known as: NORVASC TAKE 1 TABLET BY MOUTH  DAILY FOR BLOOD PRESSURE What changed: additional instructions   atorvastatin 80 MG tablet Commonly known as: LIPITOR TAKE 1 TABLET BY MOUTH  DAILY AT 6 PM What changed:  how much to take how to take this when to take this additional instructions   calcitRIOL 0.25 MCG capsule Commonly known as: ROCALTROL TAKE 1 CAPSULE BY MOUTH  DAILY WITH BREAKFAST What changed: when to take this   calcium carbonate 1500 (600 Ca) MG Tabs tablet Commonly known as: OSCAL Take 600 mg of elemental calcium by mouth 2 (two) times daily with a meal.   carvedilol 6.25 MG tablet Commonly known as: COREG Take 0.5 tablets (3.125 mg total) by mouth 2 (two) times daily. What changed:  how much to take when to take this   celecoxib 200 MG capsule Commonly known as: CELEBREX TAKE 1 CAPSULE BY MOUTH  DAILY WITH FOOD What changed: See the new instructions.   Colchicine 0.6 MG Caps TAKE 1 CAPSULE BY MOUTH  DAILY What changed:  how much  to take when to take this   DULoxetine 30 MG capsule Commonly known as: CYMBALTA TAKE 1 CAPSULE BY MOUTH  TWICE DAILY   ferrous sulfate 325 (65 FE) MG tablet Take 325 mg by mouth 2 (two) times daily with a meal.   fluticasone 50 MCG/ACT nasal spray Commonly known as: FLONASE USE 2 SPRAYS IN BOTH  NOSTRILS DAILY AS NEEDED  FOR ALLERGIES OR RHINITIS   furosemide 40 MG tablet Commonly known as: LASIX TAKE 1 TABLET BY MOUTH  DAILY IN THE MORNING What changed: when to take this   hydroxychloroquine 200 MG tablet Commonly known as: PLAQUENIL Take 1 tablet by mouth in the morning and at bedtime.   levETIRAcetam 500 MG tablet Commonly known as: KEPPRA Take 1 tablet (500 mg total) by mouth 2 (two) times daily for 5 days.   lisinopril 20 MG tablet Commonly known as: ZESTRIL TAKE 1 TABLET BY MOUTH  DAILY   metFORMIN 500 MG 24 hr tablet Commonly known as: GLUCOPHAGE-XR TAKE 1 TABLET BY MOUTH  DAILY WITH BREAKFAST What changed: when to take this   multivitamin capsule Take 1 capsule by mouth daily.   ondansetron 4 MG disintegrating tablet Commonly known as: ZOFRAN-ODT Take 1 tablet (4 mg total) by mouth every 6 (six) hours as needed for nausea.   pantoprazole 40 MG tablet Commonly known as: PROTONIX TAKE 1 TABLET BY MOUTH  DAILY   potassium chloride SA 20 MEQ tablet Commonly known as: KLOR-CON M TAKE 1 TABLET BY MOUTH  DAILY FOR POTASSIUM  REPLACEMENT/ SUPPLEMENT What changed: See the new instructions.   sildenafil 20 MG tablet Commonly known as: REVATIO Take 2-5 pills at once, orally, with each sexual encounter   tamsulosin 0.4 MG Caps capsule Commonly known as: FLOMAX TAKE 1 CAPSULE BY MOUTH AT  BEDTIME   vitamin B-12 500 MCG tablet Commonly known as: CYANOCOBALAMIN Take 500 mcg by mouth daily.          Follow-up Information     Vallarie Mare, MD Follow up in 4 week(s).   Specialty: Neurosurgery Why: as needed for your head bleed Contact  information: [place] [address] [phone]         [name], MD Follow up today.   Specialty: Family Medicine Why: post hospital follow up for chronic medical problems Contact information: [address] [place] [address] [phone]                 Signed: Saverio Danker, [facility] Surgery 02/08/2022, 8:25 AM Please see Amion for pager number during day hours 7:00am-4:30pm, 7-11:30am on Weekends

## 2022-02-04 NOTE — Evaluation (Signed)
Physical Therapy Evaluation Patient Details Name: Mitchell ARBON Sr. MRN: [id number] DOB: February 10, 1951 Today's Date: 02/04/2022  History of Present Illness  71 y.o. male presents to [facility] hospital on 02/03/2022 after fall. Pt found to have bilateral SDH and SAH. PMH includes SDH/SAH, HTN, DMII, DVT, COPD, CKD3.  Clinical Impression  Pt presents to PT with deficits in gait and balance but is very close to his baseline per pt report. Pt is able to ambulate for limited community distances and tolerate dynamic gait challenges. Pt reports wooziness near completion of ambulation with symptoms subsiding with seated rest. Pt will benefit from continued acute PT services to improve dynamic balance and to restore activity tolerance.       Recommendations for follow up therapy are one component of a multi-disciplinary discharge planning process, led by the attending physician.  Recommendations may be updated based on patient status, additional functional criteria and insurance authorization.  Follow Up Recommendations No PT follow up    Assistance Recommended at Discharge PRN  Patient can return home with the following  Assistance with cooking/housework    Equipment Recommendations None recommended by PT  Recommendations for Other Services       Functional Status Assessment Patient has had a recent decline in their functional status and demonstrates the ability to make significant improvements in function in a reasonable and predictable amount of time.     Precautions / Restrictions Precautions Precautions: Fall Precaution Comments: SBP<160 Restrictions Weight Bearing Restrictions: No      Mobility  Bed Mobility Overal bed mobility: Independent                  Transfers Overall transfer level: Needs assistance Equipment used: None Transfers: Sit to/from Stand Sit to Stand: Supervision                Ambulation/Gait Ambulation/Gait assistance: Supervision Gait Distance  (Feet): 250 Feet Assistive device: None Gait Pattern/deviations: Step-through pattern Gait velocity: functional Gait velocity interpretation: >2.62 ft/sec, indicative of community ambulatory   General Gait Details: pt with steady step-through gait, tolerates dynamic gait challenges including head turns, change of gait speed, change of stride length, backward walking.  Stairs            Wheelchair Mobility    Modified Rankin (Stroke Patients Only) Modified Rankin (Stroke Patients Only) Pre-Morbid Rankin Score: No symptoms Modified Rankin: Moderately severe disability     Balance Overall balance assessment: Needs assistance Sitting-balance support: No upper extremity supported, Feet supported Sitting balance-Leahy Scale: Good     Standing balance support: No upper extremity supported, During functional activity Standing balance-Leahy Scale: Good                               Pertinent Vitals/Pain Pain Assessment Pain Assessment: Faces Faces Pain Scale: Hurts little more Pain Location: posterior head Pain Descriptors / Indicators: Headache Pain Intervention(s): Monitored during session    Home Living Family/patient expects to be discharged to:: Private residence Living Arrangements: Spouse/significant other;Children Available Help at Discharge: Family;Available 24 hours/day Type of Home: House Home Access: Stairs to enter Entrance Stairs-Rails: Can reach both Entrance Stairs-Number of Steps: 4   Home Layout: One level Home Equipment: Cane - single Librarian, academic (2 wheels)      Prior Function Prior Level of Function : Independent/Modified Independent;Working/employed;Driving             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  Hand Dominance        Extremity/Trunk Assessment   Upper Extremity Assessment Upper Extremity Assessment: Overall WFL for tasks assessed    Lower Extremity Assessment Lower Extremity  Assessment: Overall WFL for tasks assessed    Cervical / Trunk Assessment Cervical / Trunk Assessment: Normal  Communication   Communication: No difficulties  Cognition Arousal/Alertness: Awake/alert Behavior During Therapy: WFL for tasks assessed/performed Overall Cognitive Status: Within Functional Limits for tasks assessed                                          General Comments General comments (skin integrity, edema, etc.): VSS on RA, pt reports wooziness near end of ambulation, BP 151/61, symptoms resolve when seated and resting    Exercises     Assessment/Plan    PT Assessment Patient needs continued PT services  PT Problem List Decreased activity tolerance;Decreased balance       PT Treatment Interventions Gait training;Stair training;Balance training;Patient/family education    PT Goals (Current goals can be found in the Care Plan section)  Acute Rehab PT Goals Patient Stated Goal: to return to independence PT Goal Formulation: With patient Time For Goal Achievement: 02/18/22 Potential to Achieve Goals: Good Additional Goals Additional Goal #1: Pt will score >19/24 on the DGI to indicate a reduced risk for falls    Frequency Min 3X/week     Co-evaluation               AM-PAC PT "6 Clicks" Mobility  Outcome Measure Help needed turning from your back to your side while in a flat bed without using bedrails?: None Help needed moving from lying on your back to sitting on the side of a flat bed without using bedrails?: None Help needed moving to and from a bed to a chair (including a wheelchair)?: A Little Help needed standing up from a chair using your arms (e.g., wheelchair or bedside chair)?: A Little Help needed to walk in hospital room?: A Little Help needed climbing 3-5 steps with a railing? : A Little 6 Click Score: 20    End of Session   Activity Tolerance: Patient tolerated treatment well Patient left: in chair;with call  bell/phone within reach Nurse Communication: Mobility status PT Visit Diagnosis: Other abnormalities of gait and mobility (R26.89)    Time: 8676-1950 PT Time Calculation (min) (ACUTE ONLY): 23 min   Charges:   PT Evaluation $PT Eval Low Complexity: 1 Low          Arlyss Gandy, PT, DPT Acute Rehabilitation Pager: [phone] Office [phone]   Arlyss Gandy 02/04/2022, 9:40 AM

## 2022-02-04 NOTE — Progress Notes (Signed)
Patient ID: Mitchell Maner., male   DOB: 08-06-1951, 71 y.o.   MRN: [id number] Follow up - Trauma Critical Care   Patient Details:    Mitchell Ulman. is an 71 y.o. male.  Lines/tubes :   Microbiology/Sepsis markers: Results for orders placed or performed during the hospital encounter of 02/03/22  MRSA Next Gen by PCR, Nasal     Status: Abnormal   Collection Time: 02/03/22  9:07 PM   Specimen: Nasal Mucosa; Nasal Swab  Result Value Ref Range Status   MRSA by PCR Next Gen DETECTED (A) NOT DETECTED Final    Comment: RESULT CALLED TO, READ BACK BY AND VERIFIED WITH: RN Leticia Clas 02/03/22@23 :27 by tw (NOTE) The GeneXpert MRSA Assay (FDA approved for NASAL specimens only), is one component of a comprehensive MRSA colonization surveillance program. It is not intended to diagnose MRSA infection nor to guide or monitor treatment for MRSA infections. Test performance is not FDA approved in patients less than 3 years old. Performed at [facility], [place] [address], [place], [place] [address]     Anti-infectives:  Anti-infectives (From admission, onward)    None       Best Practice/Protocols:  VTE Prophylaxis: Mechanical .  Consults: Treatment Team:  Vallarie Mare, MD    Studies:    Events:  Subjective:    Overnight Issues:   Objective:  Vital signs for last 24 hours: Temp:  [97.6 F (36.4 C)-99 F (37.2 C)] 99 F (37.2 C) (02/10 0400) Pulse Rate:  [51-78] 65 (02/10 0600) Resp:  [13-21] 18 (02/10 0600) BP: (131-174)/(52-138) 139/60 (02/10 0600) SpO2:  [91 %-100 %] 94 % (02/10 0600) Weight:  [133.8 kg] 133.8 kg (02/09 2100)  Hemodynamic parameters for last 24 hours:    Intake/Output from previous day: 02/09 0701 - 02/10 0700 In: 146.1 [I.V.:146.1] Out: 925 [Urine:925]  Intake/Output this shift: No intake/output data recorded.  Vent settings for last 24 hours:    Physical Exam:  General: alert and no respiratory distress Neuro:  alert, oriented, and MAE, F/C HEENT/Neck: no JVD Resp: clear to auscultation bilaterally CVS: regular rate and rhythm, S1, S2 normal, no murmur, click, rub or gallop GI: soft, nontender, BS WNL, no r/g Extremities: claves soft, chronic discoloration BLE  Results for orders placed or performed during the hospital encounter of 02/03/22 (from the past 24 hour(s))  HIV Antibody (routine testing w rflx)     Status: None   Collection Time: 02/03/22  6:50 PM  Result Value Ref Range   HIV Screen 4th Generation wRfx Non Reactive Non Reactive  Hemoglobin A1c     Status: Abnormal   Collection Time: 02/03/22  6:51 PM  Result Value Ref Range   Hgb A1c MFr Bld 6.7 (H) 4.8 - 5.6 %   Mean Plasma Glucose 145.59 mg/dL  MRSA Next Gen by PCR, Nasal     Status: Abnormal   Collection Time: 02/03/22  9:07 PM   Specimen: Nasal Mucosa; Nasal Swab  Result Value Ref Range   MRSA by PCR Next Gen DETECTED (A) NOT DETECTED  Glucose, capillary     Status: Abnormal   Collection Time: 02/03/22 10:01 PM  Result Value Ref Range   Glucose-Capillary 166 (H) 70 - 99 mg/dL    Assessment & Plan: Present on Admission:  SDH (subdural hematoma)    LOS: 1 day   Additional comments:I reviewed the patient's new clinical lab test results. And F/U CT H Fall going up  steps TBI/B SDH/B SAH - GCS 15, F/U CTH sl improved, Dr. Marcello Moores following, TBI team therapies CHF - Home medications CKD stage 3 - Home medications DM - SSI as vomited so hold metformin HTN - D/C Cleviprex drip, home meds + hydralazine PRN COPD - Home medications HX DVT VTE - PAS for now, LMWH 2/12 (48h S/P stable F/U CT H) FEN - vomited so clears for now, adjust pain meds DIspo - to 4NP, TBI team therapies Critical Care Total Time*: 33 Minutes  Georganna Skeans, MD, MPH, FACS Trauma & General Surgery Use [email or web address] to contact on call provider  02/04/2022  *Care during the described time interval was provided by me. I have reviewed this patient's  available data, including medical history, events of note, physical examination and test results as part of my evaluation.

## 2022-02-04 NOTE — Evaluation (Signed)
Occupational Therapy Evaluation Patient Details Name: Mitchell KERKER Sr. MRN: [id number] DOB: 1951/03/12 Today's Date: 02/04/2022   History of Present Illness 71 y.o. male presents to [facility] hospital on 02/03/2022 after fall. Pt found to have bilateral SDH and SAH. PMH includes SDH/SAH, HTN, DMII, DVT, COPD, CKD3.   Clinical Impression   Patient admitted for the diagnosis above.  Other than some mild unsteadiness and tenderness to the back of his head, he is close to his baseline for ADL and functional mobility.  OT to continue efforts in the acute setting, with home with assist as needed from his spouse recommended.        Recommendations for follow up therapy are one component of a multi-disciplinary discharge planning process, led by the attending physician.  Recommendations may be updated based on patient status, additional functional criteria and insurance authorization.   Follow Up Recommendations  No OT follow up    Assistance Recommended at Discharge Set up Supervision/Assistance  Patient can return home with the following      Functional Status Assessment  Patient has had a recent decline in their functional status and demonstrates the ability to make significant improvements in function in a reasonable and predictable amount of time.  Equipment Recommendations  None recommended by OT    Recommendations for Other Services       Precautions / Restrictions Precautions Precautions: Fall Precaution Comments: SBP<160 Restrictions Weight Bearing Restrictions: No      Mobility Bed Mobility               General bed mobility comments: Up in recliner    Transfers Overall transfer level: Needs assistance Equipment used: None Transfers: Sit to/from Stand Sit to Stand: Supervision           General transfer comment: Patient able to walk from 4N to room on 4NP.      Balance Overall balance assessment: Needs assistance Sitting-balance support: No upper extremity  supported, Feet supported Sitting balance-Leahy Scale: Normal     Standing balance support: No upper extremity supported, During functional activity Standing balance-Leahy Scale: Good                             ADL either performed or assessed with clinical judgement   ADL                                         General ADL Comments: Generalized supervision due to mild unsteadiness     Vision Baseline Vision/History: 1 Wears glasses Patient Visual Report: No change from baseline       Perception Perception Perception: Within Functional Limits   Praxis Praxis Praxis: Intact    Pertinent Vitals/Pain Pain Assessment Pain Assessment: Faces Faces Pain Scale: Hurts a little bit Pain Location: posterior head Pain Descriptors / Indicators: Aching Pain Intervention(s): Monitored during session     Hand Dominance Right   Extremity/Trunk Assessment Upper Extremity Assessment Upper Extremity Assessment: Overall WFL for tasks assessed   Lower Extremity Assessment Lower Extremity Assessment: Defer to PT evaluation   Cervical / Trunk Assessment Cervical / Trunk Assessment: Normal   Communication Communication Communication: No difficulties   Cognition Arousal/Alertness: Awake/alert Behavior During Therapy: WFL for tasks assessed/performed Overall Cognitive Status: Within Functional Limits for tasks assessed  General Comments  VSS on RA, pt reports wooziness near end of ambulation, BP 151/61, symptoms resolve when seated and resting    Exercises     Shoulder Instructions      Home Living Family/patient expects to be discharged to:: Private residence Living Arrangements: Spouse/significant other;Children Available Help at Discharge: Family;Available 24 hours/day Type of Home: House Home Access: Stairs to enter [employer] of Steps: 4 Entrance Stairs-Rails: Can reach  both;Right;Left Home Layout: One level     Bathroom Shower/Tub: Producer, television/film/video: Standard     Home Equipment: Cane - single Librarian, academic (2 wheels);Shower seat          Prior Functioning/Environment Prior Level of Function : Independent/Modified Independent;Working/employed;Driving             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 ADLs Comments: No assist        OT Problem List: Impaired balance (sitting and/or standing)      OT Treatment/Interventions: Self-care/ADL training;Therapeutic activities;Balance training    OT Goals(Current goals can be found in the care plan section) Acute Rehab OT Goals Patient Stated Goal: Head home OT Goal Formulation: With patient Time For Goal Achievement: 02/18/22 Potential to Achieve Goals: Good ADL Goals Pt Will Perform Grooming: Independently;standing Pt Will Perform Lower Body Bathing: Independently;sit to/from stand Pt Will Perform Lower Body Dressing: Independently;sit to/from stand Pt Will Transfer to Toilet: Independently;ambulating;regular height toilet  OT Frequency: Min 2X/week    Co-evaluation              AM-PAC OT "6 Clicks" Daily Activity     Outcome Measure Help from another person eating meals?: None Help from another person taking care of personal grooming?: None Help from another person toileting, which includes using toliet, bedpan, or urinal?: None Help from another person bathing (including washing, rinsing, drying)?: A Little Help from another person to put on and taking off regular upper body clothing?: None Help from another person to put on and taking off regular lower body clothing?: A Little 6 Click Score: 22   End of Session    Activity Tolerance: Patient tolerated treatment well Patient left: in chair;with call bell/phone within reach;with nursing/sitter in room  OT Visit Diagnosis: Unsteadiness on feet (R26.81)                Time: 1040-1056 OT  Time Calculation (min): 16 min Charges:  OT General Charges $OT Visit: 1 Visit OT Evaluation $OT Eval Moderate Complexity: 1 Mod  02/04/2022  RP, OTR/L  Acute Rehabilitation Services  Office:  [phone]   Suzanna Obey 02/04/2022, 11:01 AM

## 2022-02-04 NOTE — Progress Notes (Signed)
°  Transition of Care [facility]) Screening Note   Patient Details  Name: Mitchell Parker. Date of Birth: September 14, 1951   Transition of Care [facility]) CM/SW Contact:    Ella Bodo, RN Phone Number: 02/04/2022, 3:26 PM    Transition of Care Department [facility]) has reviewed patient and no TOC needs have been identified at this time. We will continue to monitor patient advancement through interdisciplinary progression rounds. If new patient transition needs arise, please place a TOC consult.  Reinaldo Raddle, RN, BSN  Trauma/Neuro ICU Case Manager [phone]

## 2022-02-05 LAB — CBC
HCT: 39.8 % (ref 39.0–52.0)
Hemoglobin: 13.8 g/dL (ref 13.0–17.0)
MCH: 33.5 pg (ref 26.0–34.0)
MCHC: 34.7 g/dL (ref 30.0–36.0)
MCV: 96.6 fL (ref 80.0–100.0)
Platelets: 110 10*3/uL — ABNORMAL LOW (ref 150–400)
RBC: 4.12 MIL/uL — ABNORMAL LOW (ref 4.22–5.81)
RDW: 13.8 % (ref 11.5–15.5)
WBC: 6 10*3/uL (ref 4.0–10.5)
nRBC: 0 % (ref 0.0–0.2)

## 2022-02-05 LAB — GLUCOSE, CAPILLARY
Glucose-Capillary: 160 mg/dL — ABNORMAL HIGH (ref 70–99)
Glucose-Capillary: 142 mg/dL — ABNORMAL HIGH (ref 70–99)
Glucose-Capillary: 169 mg/dL — ABNORMAL HIGH (ref 70–99)

## 2022-02-05 LAB — BASIC METABOLIC PANEL
Anion gap: 8 (ref 5–15)
BUN: 27 mg/dL — ABNORMAL HIGH (ref 8–23)
CO2: 24 mmol/L (ref 22–32)
Calcium: 8.8 mg/dL — ABNORMAL LOW (ref 8.9–10.3)
Chloride: 108 mmol/L (ref 98–111)
Creatinine, Ser: 1.2 mg/dL (ref 0.61–1.24)
GFR, Estimated: >60 mL/min (ref >60)
Glucose, Bld: 157 mg/dL — ABNORMAL HIGH (ref 70–99)
Potassium: 4.3 mmol/L (ref 3.5–5.1)
Sodium: 140 mmol/L (ref 135–145)

## 2022-02-05 MED ORDER — ONDANSETRON 4 MG PO TBDP: 4.0000 mg | ORAL_TABLET | Freq: Four times a day (QID) | ORAL | 0 refills | Status: DC | PRN

## 2022-02-05 MED ORDER — OXYCODONE HCL 5 MG PO TABS: 5.0000 mg | ORAL_TABLET | Freq: Four times a day (QID) | ORAL | Status: DC | PRN

## 2022-02-05 MED ORDER — LEVETIRACETAM 500 MG PO TABS
500.0000 mg | ORAL_TABLET | Freq: Two times a day (BID) | ORAL | 0 refills | Status: DC
Start: 2022-02-05 — End: 2022-03-23

## 2022-02-05 MED ORDER — ENOXAPARIN SODIUM 40 MG/0.4ML IJ SOSY
40.0000 mg | PREFILLED_SYRINGE | INTRAMUSCULAR | Status: DC
  Administered 2022-02-05: 40 mg via SUBCUTANEOUS
  Filled 2022-02-05: qty 0.4

## 2022-02-05 NOTE — Progress Notes (Signed)
° °  Providing Compassionate, Quality Care - Together   Subjective: Patient reports no issues overnight. He denies pain. He is getting ready to eat his breakfast.  Objective: Vital signs in last 24 hours: Temp:  [97.8 F (36.6 C)-98.3 F (36.8 C)] 98.3 F (36.8 C) (02/11 0745) Pulse Rate:  [51-62] 61 (02/11 0850) Resp:  [13-18] 14 (02/11 0745) BP: (130-175)/(59-76) 151/64 (02/11 0850) SpO2:  [94 %-98 %] 96 % (02/11 0850)  Intake/Output from previous day: 02/10 0701 - 02/11 0700 In: -  Out: 615 [Urine:615] Intake/Output this shift: No intake/output data recorded.  Alert and oriented PERRLA CN II-XII grossly intact MAE, Strength and sensation intact   Lab Results: Recent Labs    02/04/22 0923 02/05/22 0317  WBC 6.2 6.0  HGB 14.1 13.8  HCT 41.1 39.8  PLT 124* 110*   BMET Recent Labs    02/04/22 0923 02/05/22 0317  NA 140 140  K 4.6 4.3  CL 109 108  CO2 23 24  GLUCOSE 164* 157*  BUN 22 27*  CREATININE 1.29* 1.20  CALCIUM 8.7* 8.8*    Studies/Results: CT HEAD WO CONTRAST ( )  Result Date: 02/03/2022 CLINICAL DATA:  Initial evaluation for acute trauma, fall. Known intracranial hemorrhage. EXAM: CT HEAD WITHOUT CONTRAST TECHNIQUE: Contiguous axial images were obtained from the base of the skull through the vertex without intravenous contrast. RADIATION DOSE REDUCTION: This exam was performed according to the departmental dose-optimization program which includes automated exposure control, adjustment of the mA and/or kV according to patient size and/or use of iterative reconstruction technique. COMPARISON:  Prior CT from earlier the same day. FINDINGS: Brain: Again seen is scattered small volume subarachnoid hemorrhage involving the bilateral cerebral hemispheres, overall slightly more conspicuous as compared to prior exam. Acute bilateral subdural hematomas again seen as well. Collection on the left is slightly larger measuring up to 7 mm, previously 8 mm in maximal  thickness. Collection on the right measures up to 5 mm, little interval change from prior. Slight extension along the falx and tentorium. No appreciable intraparenchymal or intraventricular hemorrhage. No midline shift or significant mass effect. No hydrocephalus. No acute or interval infarction. Vascular: No hyperdense vessel. Skull: Suspected posterior scalp contusion.  Calvarium intact. Sinuses/Orbits: Globes and orbital soft tissues demonstrate no acute finding. Scattered mucosal thickening noted within the ethmoidal air cells. Mastoid air cells are clear. Other: None. IMPRESSION: 1. Acute bilateral subdural hematomas, slightly decreased in size on the left, and little interval changed on the right. No significant mass effect or midline shift. 2. Scattered small volume subarachnoid hemorrhage involving the bilateral cerebral hemispheres, overall slightly increased/more conspicuous as compared to prior exam. 3. Probable posterior scalp contusion. No calvarial fracture. 4. No other acute or interval intracranial abnormality. Electronically Signed   By: Rise Mu M.D.   On: 02/03/2022 20:25    Assessment/Plan: Patient sustained SDH and SAH following a fall from ground level on 02/03/2022. He was admitted to [facility] for further workup.   LOS: 2 days   -Patient will possibly discharge this afternoon -No new Neurosurgical recommendations   Val Eagle, DNP, AGNP-C Nurse Practitioner  [facility] [address], [place], [place] [address] P: [phone]     F: [phone]  02/05/2022, 9:56 AM

## 2022-02-05 NOTE — Progress Notes (Signed)
Trauma Event Note   TRN rounded on patient. Patient has been bradycardic this evening, assisted primary RN with clarifying order for carvedilol parameters r/t rate. No other needs at this time.   Last imported Vital Signs BP (!) 161/65    Pulse (!) 55    Temp 97.9 F (36.6 C) (Oral)    Resp 18    Wt 294 lb 15.6 oz (133.8 kg)    SpO2 94%    BMI 38.92 kg/m   Trending CBC Recent Labs    02/04/22 0923  WBC 6.2  HGB 14.1  HCT 41.1  PLT 124*    Trending Coag's No results for input(s): APTT, INR in the last 72 hours.  Trending BMET Recent Labs    02/04/22 0923  NA 140  K 4.6  CL 109  CO2 23  BUN 22  CREATININE 1.29*  GLUCOSE 164*      Mitchell Parker O Sher Hellinger  Trauma Response RN  Please call TRN at [phone] for further assistance.

## 2022-02-05 NOTE — Progress Notes (Signed)
Pt discharged home this afternoon after tolerating diet for breakfast and lunch. Pt's belongings packed and PIV removed. Pt's wife and brother at bedside, pt and family educated on discharge information with no questions at this time.  Robina Ade, RN

## 2022-02-05 NOTE — Progress Notes (Signed)
Subjective/Chief Complaint: Mild headache, otherwise no complaints   Objective: Vital signs in last 24 hours: Temp:  [97.8 F (36.6 C)-98.3 F (36.8 C)] 98.3 F (36.8 C) (02/11 0745) Pulse Rate:  [51-64] 62 (02/11 0745) Resp:  [13-18] 14 (02/11 0745) BP: (130-175)/(59-76) 157/66 (02/11 0745) SpO2:  [94 %-98 %] 98 % (02/11 0745) Last BM Date: 02/04/22  Intake/Output from previous day: 02/10 0701 - 02/11 0700 In: -  Out: 615 [Urine:615] Intake/Output this shift: No intake/output data recorded.  Alert, calm, cooperative Unlabored respirations Abdomen soft nontender Neuro grossly normal   Lab Results:  Recent Labs    02/04/22 0923 02/05/22 0317  WBC 6.2 6.0  HGB 14.1 13.8  HCT 41.1 39.8  PLT 124* 110*   BMET Recent Labs    02/04/22 0923 02/05/22 0317  NA 140 140  K 4.6 4.3  CL 109 108  CO2 23 24  GLUCOSE 164* 157*  BUN 22 27*  CREATININE 1.29* 1.20  CALCIUM 8.7* 8.8*   PT/INR No results for input(s): LABPROT, INR in the last 72 hours. ABG No results for input(s): PHART, HCO3 in the last 72 hours.  Invalid input(s): PCO2, PO2  Studies/Results: CT HEAD WO CONTRAST (5MM)  Result Date: 02/03/2022 CLINICAL DATA:  Initial evaluation for acute trauma, fall. Known intracranial hemorrhage. EXAM: CT HEAD WITHOUT CONTRAST TECHNIQUE: Contiguous axial images were obtained from the base of the skull through the vertex without intravenous contrast. RADIATION DOSE REDUCTION: This exam was performed according to the departmental dose-optimization program which includes automated exposure control, adjustment of the mA and/or kV according to patient size and/or use of iterative reconstruction technique. COMPARISON:  Prior CT from earlier the same day. FINDINGS: Brain: Again seen is scattered small volume subarachnoid hemorrhage involving the bilateral cerebral hemispheres, overall slightly more conspicuous as compared to prior exam. Acute bilateral subdural hematomas  again seen as well. Collection on the left is slightly larger measuring up to 7 mm, previously 8 mm in maximal thickness. Collection on the right measures up to 5 mm, little interval change from prior. Slight extension along the falx and tentorium. No appreciable intraparenchymal or intraventricular hemorrhage. No midline shift or significant mass effect. No hydrocephalus. No acute or interval infarction. Vascular: No hyperdense vessel. Skull: Suspected posterior scalp contusion.  Calvarium intact. Sinuses/Orbits: Globes and orbital soft tissues demonstrate no acute finding. Scattered mucosal thickening noted within the ethmoidal air cells. Mastoid air cells are clear. Other: None. IMPRESSION: 1. Acute bilateral subdural hematomas, slightly decreased in size on the left, and little interval changed on the right. No significant mass effect or midline shift. 2. Scattered small volume subarachnoid hemorrhage involving the bilateral cerebral hemispheres, overall slightly increased/more conspicuous as compared to prior exam. 3. Probable posterior scalp contusion. No calvarial fracture. 4. No other acute or interval intracranial abnormality. Electronically Signed   By: Jeannine Boga M.D.   On: 02/03/2022 20:25    Anti-infectives: Anti-infectives (From admission, onward)    None       Assessment/Plan: Fall going up steps TBI/B SDH/B SAH - GCS 15, F/U CTH sl improved, Dr. Marcello Moores following, TBI team therapies-no follow-up needed per PT/OT/SLP CHF - Home medications CKD stage 3 - Home medications DM - SSI as vomited so hold metformin HTN - D/C Cleviprex drip, home meds + hydralazine PRN COPD - Home medications HX DVT VTE - PAS for now, start prophylactic Lovenox FEN -try to advance diet today DIspo -possible discharge this afternoon versus tomorrow if tolerating diet  LOS: 2 days    Mitchell Parker 02/05/2022

## 2022-02-07 ENCOUNTER — Telehealth: Payer: Self-pay

## 2022-02-07 NOTE — Telephone Encounter (Signed)
Transition Care Management Follow-up Telephone Call Date of discharge and from where: [facility] 02-05-22 Dx: subdural hematoma  How have you been since you were released from the hospital? Severe headache and not much of an appetite  Any questions or concerns? No  Items Reviewed: Did the pt receive and understand the discharge instructions provided? Yes  Medications obtained and verified? Yes  Other? No  Any new allergies since your discharge? No  Dietary orders reviewed? Yes Do you have support at home? Yes   Home Care and Equipment/Supplies: Were home health services ordered? no If so, what is the name of the agency? na  Has the agency set up a time to come to the patient's home? not applicable Were any new equipment or medical supplies ordered?  No What is the name of the medical supply agency? na Were you able to get the supplies/equipment? not applicable Do you have any questions related to the use of the equipment or supplies? No  Functional Questionnaire: (I = Independent and D = Dependent) ADLs: I  Bathing/Dressing- I  Meal Prep- I  Eating- I  Maintaining continence- I  Transferring/Ambulation- I  Managing Meds- I  Follow up appointments reviewed:  PCP Hospital f/u appt confirmed? Yes  Scheduled to see Dr Hassell Done on 02-08-22 @ [facility] f/u appt confirmed? No  waiting on call from Neurology to schedule fu . Are transportation arrangements needed? No  If their condition worsens, is the pt aware to call PCP or go to the Emergency Dept.? Yes Was the patient provided with contact information for the PCP's office or ED? Yes Was to pt encouraged to call back with questions or concerns? Yes

## 2022-02-08 ENCOUNTER — Encounter: Payer: Self-pay | Admitting: Nurse Practitioner

## 2022-02-08 ENCOUNTER — Ambulatory Visit (INDEPENDENT_AMBULATORY_CARE_PROVIDER_SITE_OTHER): Payer: Medicare Other | Admitting: Nurse Practitioner

## 2022-02-08 VITALS — BP 111/66 | HR 111 | Temp 97.6°F | Resp 20 | Ht 73.0 in | Wt 285.0 lb

## 2022-02-08 DIAGNOSIS — S065XAA Traumatic subdural hemorrhage with loss of consciousness status unknown, initial encounter: Secondary | ICD-10-CM

## 2022-02-08 DIAGNOSIS — S065XAS Traumatic subdural hemorrhage with loss of consciousness status unknown, sequela: Secondary | ICD-10-CM

## 2022-02-08 DIAGNOSIS — Z7689 Persons encountering health services in other specified circumstances: Secondary | ICD-10-CM

## 2022-02-08 MED ORDER — KETOROLAC TROMETHAMINE 60 MG/2ML IM SOLN
60.0000 mg | Freq: Once | INTRAMUSCULAR | Status: AC
  Administered 2022-02-08: 60 mg via INTRAMUSCULAR

## 2022-02-08 MED ORDER — TRAMADOL HCL 50 MG PO TABS
50.0000 mg | ORAL_TABLET | Freq: Three times a day (TID) | ORAL | 0 refills | Status: AC | PRN
Start: 2022-02-08 — End: 2022-02-13

## 2022-02-08 NOTE — Progress Notes (Signed)
Subjective:    Patient ID: Mitchell Jarred., male    DOB: 15-May-1951, 71 y.o.   MRN: [id number]  Today's visit was for Transitional Care Management.  The patient was discharged from [facility] on 02/05/22 with a primary diagnosis of subdural hematoma.   Contact with the patient and/or caregiver, by a clinical staff member, was made on 02/07/22 and was documented as a telephone encounter within the EMR.  Through chart review and discussion with the patient I have determined that management of their condition is of high complexity.    Patient was admitted to the hospital on 02/03/22. He was walking up some steps to go in his house and he fell backwards, but he does not remember anything. His wife called 911 and he was brought to the hospital. He was found toi have a subdural bleed and was admitted to Neuro trauma unit. He was monitored but no other treatment needed. He was discharge on day 2 with no special treatment required. Today he c/o headache across the front of his head. No visual problems but slight nausea yesterday. No personality change or hallucinations. The only thing he has been taking is tylenol. Rates headache 5-6/10, nothing make sit worse or better.     Review of Systems  Constitutional:  Negative for diaphoresis.  Eyes:  Negative for pain.  Respiratory:  Negative for shortness of breath.   Cardiovascular:  Negative for chest pain, palpitations and leg swelling.  Gastrointestinal:  Negative for abdominal pain.  Endocrine: Negative for polydipsia.  Skin:  Negative for rash.  Neurological:  Negative for dizziness, weakness and headaches.  Hematological:  Does not bruise/bleed easily.  All other systems reviewed and are negative.     Objective:   Physical Exam Vitals and nursing note reviewed.  Constitutional:      Appearance: Normal appearance. He is well-developed.  HENT:     Head: Normocephalic.     Nose: Nose normal.     Mouth/Throat:     Mouth: Mucous membranes  are moist.     Pharynx: Oropharynx is clear.  Eyes:     Pupils: Pupils are equal, round, and reactive to light.  Neck:     Thyroid: No thyroid mass or thyromegaly.     Vascular: No carotid bruit or JVD.     Trachea: Phonation normal.  Cardiovascular:     Rate and Rhythm: Normal rate and regular rhythm.  Pulmonary:     Effort: Pulmonary effort is normal. No respiratory distress.     Breath sounds: Normal breath sounds.  Abdominal:     General: Bowel sounds are normal.     Palpations: Abdomen is soft.     Tenderness: There is no abdominal tenderness.  Musculoskeletal:        General: Normal range of motion.     Cervical back: Normal range of motion and neck supple.  Lymphadenopathy:     Cervical: No cervical adenopathy.  Skin:    General: Skin is warm and dry.  Neurological:     General: No focal deficit present.     Mental Status: He is alert and oriented to person, place, and time.     Cranial Nerves: No cranial nerve deficit.     Sensory: No sensory deficit.     Motor: No weakness.     Coordination: Coordination normal.     Gait: Gait normal.     Deep Tendon Reflexes: Reflexes normal.  Psychiatric:  Behavior: Behavior normal.        Thought Content: Thought content normal.        Judgment: Judgment normal.   BP 111/66    Pulse (!) 111    Temp 97.6 F (36.4 C) (Temporal)    Resp 20    Ht 6\' 1"  (1.854 m)    Wt 285 lb (129.3 kg)    SpO2 99%    BMI 37.60 kg/m         Assessment & Plan:  Mitchell Rod Sr. in today with chief complaint of Transitions Of Care   1. Encounter for support and coordination of transition of care Hospital records reviewed  2. Post-traumatic subdural hematoma, with unknown loss of consciousness status, sequela Ice Rest Fall prevention Watch for signs of ICP - ketorolac (TORADOL) injection 60 mg    The above assessment and management plan was discussed with the patient. The patient verbalized understanding of and has agreed to  the management plan. Patient is aware to call the clinic if symptoms persist or worsen. Patient is aware when to return to the clinic for a follow-up visit. Patient educated on when it is appropriate to go to the emergency department.   Mary-Margaret Daphine Deutscher, FNP

## 2022-02-08 NOTE — Patient Instructions (Signed)
Intracranial Pressure Monitoring °Intracranial pressure is pressure in the skull. Intracranial pressure monitoring allows your health care provider to measure and watch the pressure and any pressure changes inside your skull. Monitoring happens after a medical condition, such as a brain injury, causes the brain to swell. The information from this procedure helps to guide treatment.  °This procedure happens at the hospital, often in the intensive care unit (ICU). °How is intracranial pressure monitored? °There are three main ways to check intracranial pressure: °Putting a small, thin tube called a catheter through a drilled hole into one of the fluid-filled spaces (ventricles) in the brain. The catheter is then connected to a fluid collection system and a monitor that shows the pressure within the skull. °Putting a small pressure sensor inside a drilled hole in the skull. This sensor may be placed in the space between the skull and the protective covering of the brain. Or, the sensor may be placed in the brain tissue. °Putting a hollow screw or bolt in the space between the brain and the skull. °What are the risks? °Generally, intracranial pressure monitoring is safe. However, problems may occur. Depending on the type of procedure you have, problems may include: °Infection. °Bleeding. °Allergic reaction to medicines. °Damage to nearby structures or organs. °Not being able to complete the procedure, or the procedure not working. °Brain tissue injury that causes lasting problems. °What happens if intracranial pressure is high? °An increase in intracranial pressure can be life-threatening. If pressure is high, the brain may not work as well as it did before your medical problem began. If intracranial pressure rises higher than your blood pressure, blood will stop flowing to the brain. This can cause death of brain tissue. °If intracranial pressure is high, your health care provider can drain some of the cerebrospinal  fluid. This fluid surrounds and protects your brain and spinal cord. If the catheter or screw or bolt method is used, the fluid can be removed during monitoring. °Summary °Intracranial pressure monitoring happens after a medical condition, such as a brain injury, causes the brain to swell. It helps to guide treatment. °There are three main ways to check intracranial pressure, and each way involves a different device. Devices may be a small, thin tube (catheter), a pressure sensor, or a hollow screw or bolt. °An increase in intracranial pressure can be life-threatening. °This information is not intended to replace advice given to you by your health care provider. Make sure you discuss any questions you have with your health care provider. °Document Revised: 09/25/2019 Document Reviewed: 09/25/2019 °Elsevier Patient Education © 2022 [employer] ° °

## 2022-02-10 ENCOUNTER — Emergency Department (HOSPITAL_COMMUNITY)
Admission: EM | Admit: 2022-02-10 | Discharge: 2022-02-11 | Disposition: A | Discharge status: Home / Self Care | Payer: No Typology Code available for payment source | Attending: Emergency Medicine | Admitting: Emergency Medicine

## 2022-02-10 ENCOUNTER — Other Ambulatory Visit: Payer: Self-pay

## 2022-02-10 ENCOUNTER — Telehealth: Payer: Self-pay | Admitting: Nurse Practitioner

## 2022-02-10 ENCOUNTER — Emergency Department (HOSPITAL_COMMUNITY): Payer: No Typology Code available for payment source

## 2022-02-10 ENCOUNTER — Encounter (HOSPITAL_COMMUNITY): Payer: Self-pay | Admitting: Emergency Medicine

## 2022-02-10 DIAGNOSIS — Z20822 Contact with and (suspected) exposure to covid-19: Secondary | ICD-10-CM | POA: Insufficient documentation

## 2022-02-10 DIAGNOSIS — R112 Nausea with vomiting, unspecified: Secondary | ICD-10-CM | POA: Insufficient documentation

## 2022-02-10 DIAGNOSIS — Z79899 Other long term (current) drug therapy: Secondary | ICD-10-CM | POA: Insufficient documentation

## 2022-02-10 DIAGNOSIS — E871 Hypo-osmolality and hyponatremia: Secondary | ICD-10-CM | POA: Insufficient documentation

## 2022-02-10 DIAGNOSIS — I517 Cardiomegaly: Secondary | ICD-10-CM | POA: Diagnosis not present

## 2022-02-10 DIAGNOSIS — I509 Heart failure, unspecified: Secondary | ICD-10-CM | POA: Diagnosis not present

## 2022-02-10 DIAGNOSIS — R739 Hyperglycemia, unspecified: Secondary | ICD-10-CM | POA: Insufficient documentation

## 2022-02-10 DIAGNOSIS — J9811 Atelectasis: Secondary | ICD-10-CM | POA: Diagnosis not present

## 2022-02-10 DIAGNOSIS — J449 Chronic obstructive pulmonary disease, unspecified: Secondary | ICD-10-CM | POA: Insufficient documentation

## 2022-02-10 DIAGNOSIS — R519 Headache, unspecified: Secondary | ICD-10-CM | POA: Insufficient documentation

## 2022-02-10 DIAGNOSIS — R0602 Shortness of breath: Secondary | ICD-10-CM | POA: Insufficient documentation

## 2022-02-10 DIAGNOSIS — I110 Hypertensive heart disease with heart failure: Secondary | ICD-10-CM | POA: Insufficient documentation

## 2022-02-10 LAB — CBC WITH DIFFERENTIAL/PLATELET
Abs Immature Granulocytes: 0.03 10*3/uL (ref 0.00–0.07)
Basophils Absolute: 0 10*3/uL (ref 0.0–0.1)
Basophils Relative: 0 %
Eosinophils Absolute: 0 10*3/uL (ref 0.0–0.5)
Eosinophils Relative: 0 %
HCT: 44.1 % (ref 39.0–52.0)
Hemoglobin: 14.9 g/dL (ref 13.0–17.0)
Immature Granulocytes: 0 %
Lymphocytes Relative: 7 %
Lymphs Abs: 0.7 10*3/uL (ref 0.7–4.0)
MCH: 32.5 pg (ref 26.0–34.0)
MCHC: 33.8 g/dL (ref 30.0–36.0)
MCV: 96.3 fL (ref 80.0–100.0)
Monocytes Absolute: 0.9 10*3/uL (ref 0.1–1.0)
Monocytes Relative: 10 %
Neutro Abs: 7.9 10*3/uL — ABNORMAL HIGH (ref 1.7–7.7)
Neutrophils Relative %: 83 %
Platelets: 182 10*3/uL (ref 150–400)
RBC: 4.58 MIL/uL (ref 4.22–5.81)
RDW: 13.6 % (ref 11.5–15.5)
WBC: 9.6 10*3/uL (ref 4.0–10.5)
nRBC: 0 % (ref 0.0–0.2)

## 2022-02-10 LAB — BASIC METABOLIC PANEL
Anion gap: 11 (ref 5–15)
BUN: 24 mg/dL — ABNORMAL HIGH (ref 8–23)
CO2: 23 mmol/L (ref 22–32)
Calcium: 9.1 mg/dL (ref 8.9–10.3)
Chloride: 96 mmol/L — ABNORMAL LOW (ref 98–111)
Creatinine, Ser: 1.21 mg/dL (ref 0.61–1.24)
GFR, Estimated: >60 mL/min (ref >60)
Glucose, Bld: 224 mg/dL — ABNORMAL HIGH (ref 70–99)
Potassium: 4.8 mmol/L (ref 3.5–5.1)
Sodium: 130 mmol/L — ABNORMAL LOW (ref 135–145)

## 2022-02-10 LAB — TROPONIN I (HIGH SENSITIVITY)
Troponin I (High Sensitivity): 71 ng/L — ABNORMAL HIGH (ref <18)
Troponin I (High Sensitivity): 74 ng/L — ABNORMAL HIGH (ref <18)

## 2022-02-10 LAB — BRAIN NATRIURETIC PEPTIDE: B Natriuretic Peptide: 59.4 pg/mL (ref 0.0–100.0)

## 2022-02-10 LAB — D-DIMER, QUANTITATIVE: D-Dimer, Quant: 1.06 ug/mL-FEU — ABNORMAL HIGH (ref 0.00–0.50)

## 2022-02-10 LAB — RESP PANEL BY RT-PCR (FLU A&B, COVID) ARPGX2
Influenza A by PCR: NEGATIVE
Influenza B by PCR: NEGATIVE
SARS Coronavirus 2 by RT PCR: NEGATIVE

## 2022-02-10 MED ORDER — PROCHLORPERAZINE EDISYLATE 10 MG/2ML IJ SOLN
10.0000 mg | Freq: Once | INTRAMUSCULAR | Status: AC
  Administered 2022-02-10: 10 mg via INTRAVENOUS
  Filled 2022-02-10: qty 2

## 2022-02-10 MED ORDER — IOHEXOL 350 MG/ML SOLN
65.0000 mL | Freq: Once | INTRAVENOUS | Status: AC | PRN
  Administered 2022-02-10: 65 mL via INTRAVENOUS

## 2022-02-10 MED ORDER — DIPHENHYDRAMINE HCL 50 MG/ML IJ SOLN
12.5000 mg | Freq: Once | INTRAMUSCULAR | Status: AC
  Administered 2022-02-10: 12.5 mg via INTRAVENOUS
  Filled 2022-02-10: qty 1

## 2022-02-10 MED ORDER — PROMETHAZINE HCL 25 MG PO TABS: 25.0000 mg | ORAL_TABLET | Freq: Four times a day (QID) | ORAL | 0 refills | Status: DC | PRN

## 2022-02-10 MED ORDER — SODIUM CHLORIDE 0.9 % IV SOLN
12.5000 mg | Freq: Once | INTRAVENOUS | Status: AC
  Administered 2022-02-10: 12.5 mg via INTRAVENOUS
  Filled 2022-02-10: qty 0.5

## 2022-02-10 NOTE — Telephone Encounter (Signed)
Tried to contact patient x2- would ring once then cut off  When patient calls back - please ask what exact symptoms is he having?

## 2022-02-10 NOTE — Telephone Encounter (Signed)
Pts wife called to let MMM know that pt is not feeling any better. Wants to know if there is something else that can be done for him or does he need to go back to [facility]?  Please advise and call wife/patient.

## 2022-02-10 NOTE — ED Triage Notes (Addendum)
Patient here with complaint of progressively worsening frontal headache after a fall last week where he landed on the back of his head. Acute subdural hematomas on CT from 02/03/2022. Pupils are equal, round, and reactive to light. Patient is alert, oriented, and in no apparent distress at this time.

## 2022-02-10 NOTE — ED Notes (Signed)
Pt to CT

## 2022-02-10 NOTE — Discharge Instructions (Addendum)
It was a pleasure taking care of you today. As discussed, your labs were reassuring today. I spoke to the cardiologist who notes that you were in an abnormal rhythm. Please call your cardiologist tomorrow to schedule an appointment for further evaluation. Your CT head showed improvement in the bleeds. I am sending you home with nausea medication. You may take as needed for nausea, but do not take with Zofran. Follow-up with PCP within the next week for further evaluation. Return to the ER for new or worsening symptoms.

## 2022-02-10 NOTE — ED Provider Triage Note (Signed)
Emergency Medicine Provider Triage Evaluation Note  Mitchell Parker. , a 71 y.o. male  was evaluated in triage.  Pt complains of headache.  He states that on 2/9 he fell and sustained a subdural hematoma.  He was admitted here with conservative management recommended by neurosurgery and told that his pain would likely improve in the next few days.  However, he states that his pain is worsening.  He saw his primary care doctor a few days ago prescribed tramadol.  He has been taking this as prescribed with no relief.  He is alert and oriented and wife who is present in the room states that he has been acting normally otherwise.  Review of Systems  Positive: Headache Negative: Vision changes, nausea, vomiting  Physical Exam  BP (!) 156/87 (BP Location: Right Arm)    Pulse (!) 114    Temp 98.9 F (37.2 C) (Oral)    Resp 20    SpO2 100%  Gen:   Awake, no distress   Resp:  Normal effort  MSK:   Moves extremities without difficulty  Other:  Neurologically intact, alert and oriented, PERRLA  Medical Decision Making  Medically screening exam initiated at 12:40 PM.  Appropriate orders placed.  Mitchell ENGEMAN Sr. was informed that the remainder of the evaluation will be completed by another provider, this initial triage assessment does not replace that evaluation, and the importance of remaining in the ED until their evaluation is complete.     Bud Face, PA-C 02/10/22 1243

## 2022-02-10 NOTE — ED Provider Notes (Signed)
[facility] EMERGENCY DEPARTMENT Provider Note   CSN: [id number] Arrival date & time: 02/10/22  1159     History  Chief Complaint  Patient presents with   Mitchell Parker. is a 71 y.o. male with a past medical history significant for CHF, COPD, hypertension, OSA on CPAP, history of PE not currently on any anticoagulants presents to the ED due to a persistent frontal headache.  Patient sustained a fall on 2/9 and was admitted to the hospital until 2/11 due to bilateral subdural hematomas and bilateral subarachnoid hemorrhage he was admitted to the ICU where neurosurgery was consulted and recommended conservative management.  Repeat CT head during his admission revealed overall stability of his injuries.  During patient's admission it was noted patient was complaining of frequent headaches with some nausea and vomiting which was thought to be secondary to his TBI.  Patient was referred to the TBI clinic.  Patient was seen by his PCP on 2/14 for hospital follow-up where he endorsed a frontal headache.  Patient was prescribed tramadol which he has been taking with no relief of symptoms. Patient notes his headache has remained persistent since his admission.  Denies visual changes, speech changes, and unilateral weakness.  Patient also endorses shortness of breath that started a few days after discharge.  Denies associated chest pain.  Shortness of breath worse with exertion however, also occurs at rest.  Previous history of PE not currently on any anticoagulants.  Denies lower extremity edema.  History of CHF.  Denies recent weight gain.       Home Medications Prior to Admission medications   Medication Sig Start Date End Date Taking? Authorizing Provider  promethazine (PHENERGAN) 25 MG tablet Take 1 tablet (25 mg total) by mouth every 6 (six) hours as needed for nausea or vomiting. 02/10/22  Yes Tarini Carrier, Druscilla Brownie, PA-C  acetaminophen (TYLENOL) 500 MG tablet Take 500  mg by mouth every 6 (six) hours as needed for mild pain.    [provider]  allopurinol (ZYLOPRIM) 300 MG tablet TAKE 1 TABLET BY MOUTH IN  THE MORNING Patient taking differently: Take 300 mg by mouth daily. 12/31/21   [name], MD  amLODipine (NORVASC) 5 MG tablet TAKE 1 TABLET BY MOUTH  DAILY FOR BLOOD PRESSURE Patient taking differently: Take 5 mg by mouth daily. 12/31/21   [name], MD  atorvastatin (LIPITOR) 80 MG tablet TAKE 1 TABLET BY MOUTH  DAILY AT 6 PM Patient taking differently: Take 80 mg by mouth every evening. 08/03/21   [name], MD  calcitRIOL (ROCALTROL) 0.25 MCG capsule TAKE 1 CAPSULE BY MOUTH  DAILY WITH BREAKFAST Patient taking differently: Take 0.25 mcg by mouth daily. 01/07/22   [name], MD  calcium carbonate (OSCAL) 1500 (600 Ca) MG TABS tablet Take 600 mg of elemental calcium by mouth 2 (two) times daily with a meal.    [provider]  carvedilol (COREG) 6.25 MG tablet Take 0.5 tablets (3.125 mg total) by mouth 2 (two) times daily. Patient taking differently: Take 6.25 mg by mouth every evening. 08/03/21   [name], MD  celecoxib (CELEBREX) 200 MG capsule TAKE 1 CAPSULE BY MOUTH  DAILY WITH FOOD Patient taking differently: Take 200 mg by mouth daily. 12/06/21   [name], MD  Colchicine 0.6 MG CAPS TAKE 1 CAPSULE BY MOUTH  DAILY Patient taking differently: Take 1 tablet by mouth every evening. 12/22/21   [name], MD  DULoxetine (CYMBALTA)  30 MG capsule TAKE 1 CAPSULE BY MOUTH  TWICE DAILY Patient taking differently: Take 30 mg by mouth 2 (two) times daily. 12/22/21   [name], MD  ferrous sulfate 325 (65 FE) MG tablet Take 325 mg by mouth 2 (two) times daily with a meal.    [provider]  fluticasone (FLONASE) 50 MCG/ACT nasal spray USE 2 SPRAYS IN BOTH  NOSTRILS DAILY AS NEEDED  FOR ALLERGIES OR RHINITIS 02/01/21   [name], MD  furosemide (LASIX) 40 MG tablet TAKE 1 TABLET BY MOUTH  DAILY IN THE  MORNING Patient taking differently: Take 40 mg by mouth daily. 12/31/21   [name], MD  hydroxychloroquine (PLAQUENIL) 200 MG tablet Take 1 tablet by mouth in the morning and at bedtime. 10/23/20   [provider]  levETIRAcetam (KEPPRA) 500 MG tablet Take 1 tablet (500 mg total) by mouth 2 (two) times daily for 5 days. 02/05/22 02/10/22  Berna Bue, MD  lisinopril (ZESTRIL) 20 MG tablet TAKE 1 TABLET BY MOUTH  DAILY Patient taking differently: Take 20 mg by mouth daily. 07/21/21   Jonelle Sidle, MD  metFORMIN (GLUCOPHAGE-XR) 500 MG 24 hr tablet TAKE 1 TABLET BY MOUTH  DAILY WITH BREAKFAST Patient taking differently: Take 500 mg by mouth every evening. 10/04/21   [name], MD  Multiple Vitamin (MULTIVITAMIN) capsule Take 1 capsule by mouth daily.    [provider]  ondansetron (ZOFRAN-ODT) 4 MG disintegrating tablet Take 1 tablet (4 mg total) by mouth every 6 (six) hours as needed for nausea. 02/05/22   Berna Bue, MD  pantoprazole (PROTONIX) 40 MG tablet TAKE 1 TABLET BY MOUTH  DAILY Patient taking differently: Take 40 mg by mouth daily. 12/06/21   [name], MD  potassium chloride SA (KLOR-CON M) 20 MEQ tablet TAKE 1 TABLET BY MOUTH  DAILY FOR POTASSIUM  REPLACEMENT/ SUPPLEMENT Patient taking differently: Take 20 mEq by mouth daily. 12/06/21   [name], MD  sildenafil (REVATIO) 20 MG tablet Take 2-5 pills at once, orally, with each sexual encounter 08/04/21   [name], MD  tamsulosin (FLOMAX) 0.4 MG CAPS capsule TAKE 1 CAPSULE BY MOUTH AT  BEDTIME 12/31/21   [name], MD  traMADol (ULTRAM) 50 MG tablet Take 1 tablet (50 mg total) by mouth every 8 (eight) hours as needed for up to 5 days. 02/08/22 02/13/22  Bennie Pierini, FNP  vitamin B-12 (CYANOCOBALAMIN) 500 MCG tablet Take 500 mcg by mouth daily.    [provider]      Allergies    Ciprofloxacin, Definity [perflutren lipid microsphere], and Levofloxacin  [levofloxacin]    Review of Systems   Review of Systems  Constitutional:  Negative for chills and fever.  Eyes:  Negative for visual disturbance.  Respiratory:  Positive for shortness of breath. Negative for cough.   Cardiovascular:  Negative for chest pain and leg swelling.  Gastrointestinal:  Positive for nausea. Negative for vomiting.  Neurological:  Positive for headaches. Negative for dizziness and speech difficulty.   Physical Exam Updated Vital Signs BP (!) 185/71    Pulse 62    Temp 99.7 F (37.6 C) (Oral)    Resp (!) 22    SpO2 98%  Physical Exam Vitals and nursing note reviewed.  Constitutional:      General: He is not in acute distress.    Appearance: He is not ill-appearing.  HENT:     Head: Normocephalic.  Eyes:     Pupils: Pupils  are equal, round, and reactive to light.  Cardiovascular:     Rate and Rhythm: Normal rate and regular rhythm.     Pulses: Normal pulses.     Heart sounds: Normal heart sounds. No murmur heard.   No friction rub. No gallop.  Pulmonary:     Effort: Pulmonary effort is normal.     Breath sounds: Normal breath sounds.     Comments: Respirations equal and unlabored, patient able to speak in full sentences, lungs clear to auscultation bilaterally Abdominal:     General: Abdomen is flat. There is no distension.     Palpations: Abdomen is soft.     Tenderness: There is no abdominal tenderness. There is no guarding or rebound.  Musculoskeletal:        General: Normal range of motion.     Cervical back: Neck supple.     Comments: No lower extremity edema  Skin:    General: Skin is warm and dry.     Comments: Hyperpigmentation of bilateral lower extremities  Neurological:     General: No focal deficit present.     Mental Status: He is alert.     Comments: Speech is clear, able to follow commands CN III-XII intact Normal strength in upper and lower extremities bilaterally including dorsiflexion and plantar flexion, strong and equal grip  strength Sensation grossly intact throughout Moves extremities without ataxia, coordination intact No pronator drift  Psychiatric:        Mood and Affect: Mood normal.        Behavior: Behavior normal.    ED Results / Procedures / Treatments   Labs (all labs ordered are listed, but only abnormal results are displayed) Labs Reviewed  CBC WITH DIFFERENTIAL/PLATELET - Abnormal; Notable for the following components:      Result Value   Neutro Abs 7.9 (*)    All other components within normal limits  BASIC METABOLIC PANEL - Abnormal; Notable for the following components:   Sodium 130 (*)    Chloride 96 (*)    Glucose, Bld 224 (*)    BUN 24 (*)    All other components within normal limits  D-DIMER, QUANTITATIVE - Abnormal; Notable for the following components:   D-Dimer, Quant 1.06 (*)    All other components within normal limits  TROPONIN I (HIGH SENSITIVITY) - Abnormal; Notable for the following components:   Troponin I (High Sensitivity) 71 (*)    All other components within normal limits  TROPONIN I (HIGH SENSITIVITY) - Abnormal; Notable for the following components:   Troponin I (High Sensitivity) 74 (*)    All other components within normal limits  RESP PANEL BY RT-PCR (FLU A&B, COVID) [id number]  BRAIN NATRIURETIC PEPTIDE    EKG EKG Interpretation  Date/Time:  Thursday February 10 2022 18:53:30 EST Ventricular Rate:  81 PR Interval:  104 QRS Duration: 119 QT Interval:  386 QTC Calculation: 448 R Axis:   94 Text Interpretation: Atrial tachycardia Short PR interval Nonspecific intraventricular conduction delay ST depr, consider ischemia, inferior leads Confirmed by Lennice Sites ([id number]) on 02/10/2022 10:31:41 PM  Radiology CT Head Wo Contrast  Result Date: 02/10/2022 CLINICAL DATA:  Worsening headache.  Recent subdural hematoma. EXAM: CT HEAD WITHOUT CONTRAST TECHNIQUE: Contiguous axial images were obtained from the base of the skull through the vertex without intravenous  contrast. RADIATION DOSE REDUCTION: This exam was performed according to the departmental dose-optimization program which includes automated exposure control, adjustment of the mA and/or kV according to patient  size and/or use of iterative reconstruction technique. COMPARISON:  CT head dated February 03, 2022. FINDINGS: Brain: Decreasing acute left subdural hematoma currently measuring 3 mm in maximal diameter, previously 7 mm. Right-sided subdural hematoma has resolved with residual hygroma. Decreasing small volume subarachnoid hemorrhage in both cerebral hemispheres. No new hemorrhage. No evidence of acute infarction, hydrocephalus, or mass lesion/mass effect. Vascular: No hyperdense vessel or unexpected calcification. Skull: Normal. Negative for fracture or focal lesion. Sinuses/Orbits: No acute finding. Other: None. IMPRESSION: 1. Decreasing acute left subdural hematoma currently measuring 3 mm in maximal diameter, previously 7 mm. Right-sided subdural hematoma has resolved with residual hygroma. 2. Decreasing small volume subarachnoid hemorrhage in both cerebral hemispheres. No new hemorrhage. Electronically Signed   By: Titus Dubin M.D.   On: 02/10/2022 13:36   CT Angio Chest PE W and/or Wo Contrast  Result Date: 02/10/2022 CLINICAL DATA:  Elevated D-dimer, headache, recent subdural hematoma EXAM: CT ANGIOGRAPHY CHEST WITH CONTRAST TECHNIQUE: Multidetector CT imaging of the chest was performed using the standard protocol during bolus administration of intravenous contrast. Multiplanar CT image reconstructions and MIPs were obtained to evaluate the vascular anatomy. RADIATION DOSE REDUCTION: This exam was performed according to the departmental dose-optimization program which includes automated exposure control, adjustment of the mA and/or kV according to patient size and/or use of iterative reconstruction technique. CONTRAST:  62mL OMNIPAQUE IOHEXOL 350 MG/ML SOLN IV COMPARISON:  None FINDINGS:  Cardiovascular: Mild atherosclerotic calcifications aorta without aneurysm or dissection. Coronary arterial calcifications. Enlargement of cardiac chambers. No pericardial effusion. Pulmonary arteries adequately opacified. No filling defects identified to suggest pulmonary embolism. A tiny atretic vessel is seen within the anterior RIGHT upper lobe, appears old. Mediastinum/Nodes: Esophagus unremarkable. Base of cervical region normal appearance. No thoracic adenopathy. Lungs/Pleura: Few blebs at RIGHT apex. Calcified granuloma RIGHT middle lobe. Central peribronchial thickening. Dependent atelectasis LEFT lower lobe. No acute infiltrate, pleural effusion, or pneumothorax. Upper Abdomen: Prior gastric stapling. Post cholecystectomy. Small LEFT renal cyst. Remaining visualized Musculoskeletal: No acute osseous findings. Review of the MIP images confirms the above findings. IMPRESSION: No evidence of pulmonary embolism. Coronary artery disease. Bronchitic changes with minimal LEFT basilar atelectasis Aortic Atherosclerosis (ICD10-I70.0). Electronically Signed   By: Lavonia Dana M.D.   On: 02/10/2022 20:56   DG Chest Portable 1 View  Result Date: 02/10/2022 CLINICAL DATA:  Short of breath. EXAM: PORTABLE CHEST 1 VIEW COMPARISON:  10/08/2012 FINDINGS: Mild cardiac enlargement. Negative for heart failure or edema. Lungs clear without infiltrate or effusion. IMPRESSION: No active disease. Electronically Signed   By: Franchot Gallo M.D.   On: 02/10/2022 17:36    Procedures Procedures    Medications Ordered in ED Medications  promethazine (PHENERGAN) 12.5 mg in sodium chloride 0.9 % 50 mL IVPB (has no administration in time range)  prochlorperazine (COMPAZINE) injection 10 mg (10 mg Intravenous Given 02/10/22 1716)  diphenhydrAMINE (BENADRYL) injection 12.5 mg (12.5 mg Intravenous Given 02/10/22 1714)  iohexol (OMNIPAQUE) 350 MG/ML injection 65 mL (65 mLs Intravenous Contrast Given 02/10/22 2038)    ED Course/  Medical Decision Making/ A&P Clinical Course as of 02/10/22 2315  Thu Feb 10, 2022  1902 Spoke to CT tech in regards to patient's allergy to Definity. CT tech feels patient is safe to have CTA PE study performed. [CA]  1920 D-Dimer, Quant(!): 1.06 [CA]  1929 Troponin I (High Sensitivity)(!): 71 [CA]  1929 Glucose(!): [place] Sodium(!): 130 [CA]    Clinical Course User Index [CA] Suzy Bouchard, PA-C  Medical Decision Making Amount and/or Complexity of Data Reviewed Independent Historian: spouse    Details: Wife at bedside provided some history External Data Reviewed: notes.    Details: PCP note Labs: ordered. Decision-making details documented in ED Course. Radiology: ordered and independent interpretation performed. Decision-making details documented in ED Course. ECG/medicine tests: ordered and independent interpretation performed. Decision-making details documented in ED Course.  Risk Prescription drug management.   71 year old male presents to the ED due to persistent frontal headache associated with nausea.  Patient recently admitted to the hospital after a mechanical fall on 2/9 where he sustained bilateral subdural hematomas and bilateral subarachnoid hemorrhages.  Patient admitted to the ICU where neurosurgery was consulted who recommended conservative management.  Repeat CT at that time demonstrated stability of his injuries.  Patient notes he has been having a persistent headache since his admission.  No recurrent falls.  Patient not currently on any blood thinners.  Patient also endorses shortness of breath that started a few days after admission.  Previous history of PE not currently on any anticoagulants.  No lower extremity edema.  History of CHF.  Denies recent weight gain.  Upon arrival patient noted to be tachycardic at 114 with normal oxygen saturation.  Patient afebrile.  Tachycardia improved during his wait time and was normal during  my initial evaluation.  Unfortunately, patient waited over 4 1/2 hours prior to my initial evaluation due to long wait times.  Patient in no acute distress.  Reassuring physical exam.  Normal neurological exam without any deficits. Lungs clear to auscultation bilaterally.  No lower extremity edema.  CT head ordered at triage to rule out worsening bleeds.  We will add chest x-ray to rule out evidence of pneumonia or other acute abnormalities.  Routine labs ordered.  BNP to rule out CHF exacerbation. EKG and troponin to rule out ACS.   CBC reassuring.  No leukocytosis and normal hemoglobin.  D-dimer elevated at 1.06.  CTA to rule out PE. Troponin elevated 71>74.  EKG demonstrates some ST depressions.  BMP significant for hyponatremia 130.  Hyperglycemia at 224 without evidence of DKA.  COVID/influenza negative.  BNP normal.  Doubt CHF.  Chest x-ray personally reviewed and interpreted which is negative for signs of pneumonia, pneumothorax, or widened mediastinum.  Agree with radiology interpretation.  CT head personally reviewed which demonstrates improving hematoma.  Right-sided subdural hematoma had resolved.  Decreasing subarachnoid hemorrhages.  Upon reassessment, patient notes resolution in headache.   CTA chest negative for PE. Given delta troponin is flat, low suspicion for ACS. No active chest pain his entire ED stay.   Discussed with Dr. Hassell Done with cariology who reviewed patient's labs/chart. She notes EKG demonstrates atrial tachycardia which is likely the cause of the elevated troponins given patient came in tachycardic; however appears to be rate controlled at this time. She notes patient will eventually need to be on anticoagulants; however given recent head trauma with bleeds will hold at this time. She feels from a cardiology standpoint, patient is stable for discharge with outpatient follow-up with Dr. Domenic Polite.   No episodes of hypoxia. Patient's HR remained controlled during his ED stay  except upon arrival. No further episodes of tachycardia. Referral placed for A. Fib clinic. Will hold on anticoagulants due to recent brain bleed. Advised patient to call his cardiology tomorrow to schedule an appointment for further evaluation. Suspect headache related to post-concussion syndrome. Strict ED precautions discussed with patient. Patient states understanding and agrees to plan. Patient discharged home  in no acute distress and stable vitals  Discussed with Dr. Ronnald Nian who evaluated patient at bedside who agrees with assessment and plan.         Final Clinical Impression(s) / ED Diagnoses Final diagnoses:  Acute nonintractable headache, unspecified headache type    Rx / DC Orders ED Discharge Orders          Ordered    promethazine (PHENERGAN) 25 MG tablet  Every 6 hours PRN        02/10/22 2258    Amb Referral to AFIB Clinic        02/10/22 [place], [place], PA-C 02/10/22 2316    Lennice Sites, DO 02/10/22 2331

## 2022-02-11 ENCOUNTER — Encounter: Payer: Self-pay | Admitting: Physical Medicine & Rehabilitation

## 2022-02-11 ENCOUNTER — Other Ambulatory Visit: Payer: Self-pay | Admitting: Family Medicine

## 2022-02-11 MED ORDER — PROMETHAZINE HCL 25 MG PO TABS: 25.0000 mg | ORAL_TABLET | Freq: Four times a day (QID) | ORAL | 0 refills | Status: DC | PRN

## 2022-02-21 ENCOUNTER — Other Ambulatory Visit: Payer: Self-pay | Admitting: Family Medicine

## 2022-02-22 ENCOUNTER — Encounter: Payer: Self-pay | Admitting: Cardiology

## 2022-02-22 NOTE — Telephone Encounter (Signed)
Apt scheduled for 8:10 03/23/2022 [facility]

## 2022-02-22 NOTE — Telephone Encounter (Signed)
[employer]. NTBS for 6 mos ckup in March. Mail order sent

## 2022-02-22 NOTE — Progress Notes (Signed)
Cardiology Office Note  Date: 02/23/2022   ID: Mitchell Parker., DOB 09/13/1951, MRN [id number]  PCP:  Mitchell Fraise, MD  Cardiologist:  Mitchell Lesches, MD Electrophysiologist:  None   Chief Complaint  Patient presents with   Cardiac follow-up    History of Present Illness: Mitchell Parker. is a 71 y.o. male last seen in October 2022.  He was recently hospitalized after a fall resulting in bilateral subdural hematomas and subarachnoid hemorrhages.  He was managed on the trauma service, underwent serial imaging with stability, and ultimately PT/OT.  No surgical intervention was required.  He does not recall what happened, apparently fell while he was walking outside on some steps.  Subsequent ER visit noted on February 16 with headache.  Follow-up head CT showed improving hematomas and subarachnoid hemorrhage compared to prior imaging.  ECG was noted to show atrial flutter with 3:1 block as noted below.  He does have a prior history of palpitations, but no clearly documented arrhythmia.  He does not report any sense of palpitations or chest pain.  Headaches have been improving gradually.  I reviewed his medications which are outlined below.  He has follow-up with PCP and neurosurgery later this month.  I personally reviewed his ECG today which shows we discussed setting up an outpatient heart monitor.  Past Medical History:  Diagnosis Date   Chronic diarrhea    CKD (chronic kidney disease) stage 3, GFR 30-59 ml/min (HCC)    COPD (chronic obstructive pulmonary disease) (HCC)    DVT (deep venous thrombosis) (HCC)    Essential hypertension    Gallstones    Gout    History of colonic polyps    History of DVT (deep vein thrombosis)    Kidney stones    Lumbar disc disease    Neuropathy    Nonischemic cardiomyopathy (HCC)    Minor coronary atherosclerosis at cardiac catheterization 2003, LVEF initially 20% with normalization   Obesity    Orthostatic hypotension    RA  (rheumatoid arthritis) (HCC)    Sleep apnea    Subarachnoid hemorrhage (HCC)    Traumatic   Subdural hematoma    Traumatic   Syncope, vasovagal    Type 2 diabetes mellitus ([place])     Past Surgical History:  Procedure Laterality Date   BARIATRIC SURGERY  01/11/2018   CHOLECYSTECTOMY  01/18/2012   Procedure: LAPAROSCOPIC CHOLECYSTECTOMY WITH INTRAOPERATIVE CHOLANGIOGRAM;  Surgeon: Mitchell Moores A. Cornett, MD;  Location: [facility];  Service: General;  Laterality: N/A;   EYE SURGERY     Film over retina was removed.   PILONIDAL CYST EXCISION  0000000   UMBILICAL HERNIA REPAIR     VARICOSE VEIN SURGERY     left leg    Current Outpatient Medications  Medication Sig Dispense Refill   acetaminophen (TYLENOL) 500 MG tablet Take 500 mg by mouth every 6 (six) hours as needed for mild pain.     allopurinol (ZYLOPRIM) 300 MG tablet TAKE 1 TABLET BY MOUTH IN  THE MORNING (Patient taking differently: Take 300 mg by mouth daily.) 90 tablet 0   amLODipine (NORVASC) 5 MG tablet TAKE 1 TABLET BY MOUTH  DAILY FOR BLOOD PRESSURE (Patient taking differently: Take 5 mg by mouth daily.) 90 tablet 0   atorvastatin (LIPITOR) 80 MG tablet TAKE 1 TABLET BY MOUTH  DAILY AT 6 PM (Patient taking differently: Take 80 mg by mouth every evening.) 90 tablet 3   calcitRIOL (ROCALTROL) 0.25 MCG capsule TAKE 1  CAPSULE BY MOUTH  DAILY WITH BREAKFAST (Patient taking differently: Take 0.25 mcg by mouth daily.) 90 capsule 0   calcium carbonate (OSCAL) 1500 (600 Ca) MG TABS tablet Take 600 mg of elemental calcium by mouth 2 (two) times daily with a meal.     carvedilol (COREG) 6.25 MG tablet Take 0.5 tablets (3.125 mg total) by mouth 2 (two) times daily. (Patient taking differently: Take 6.25 mg by mouth every evening.) 180 tablet 3   celecoxib (CELEBREX) 200 MG capsule TAKE 1 CAPSULE BY MOUTH DAILY  WITH FOOD 90 capsule 0   Colchicine 0.6 MG CAPS TAKE 1 CAPSULE BY MOUTH  DAILY (Patient taking differently: Take 1 tablet by mouth every  evening.) 90 capsule 0   DULoxetine (CYMBALTA) 30 MG capsule TAKE 1 CAPSULE BY MOUTH  TWICE DAILY (Patient taking differently: Take 30 mg by mouth 2 (two) times daily.) 180 capsule 0   ferrous sulfate 325 (65 FE) MG tablet Take 325 mg by mouth 2 (two) times daily with a meal.     fluticasone (FLONASE) 50 MCG/ACT nasal spray USE 2 SPRAYS IN BOTH  NOSTRILS DAILY AS NEEDED  FOR ALLERGIES OR RHINITIS 48 g 2   furosemide (LASIX) 40 MG tablet TAKE 1 TABLET BY MOUTH  DAILY IN THE MORNING (Patient taking differently: Take 40 mg by mouth daily.) 90 tablet 0   hydroxychloroquine (PLAQUENIL) 200 MG tablet Take 1 tablet by mouth in the morning and at bedtime.     levETIRAcetam (KEPPRA) 500 MG tablet Take 1 tablet (500 mg total) by mouth 2 (two) times daily for 5 days. 10 tablet 0   lisinopril (ZESTRIL) 20 MG tablet TAKE 1 TABLET BY MOUTH  DAILY (Patient taking differently: Take 20 mg by mouth daily.) 90 tablet 3   metFORMIN (GLUCOPHAGE-XR) 500 MG 24 hr tablet TAKE 1 TABLET BY MOUTH  DAILY WITH BREAKFAST (Patient taking differently: Take 500 mg by mouth every evening.) 90 tablet 0   minocycline (MINOCIN) 100 MG capsule TAKE 1 CAPSULE (100 MG TOTAL) BY MOUTH DAILY. TAKE ON AN EMPTY STOMACH 90 capsule 1   Multiple Vitamin (MULTIVITAMIN) capsule Take 1 capsule by mouth daily.     ondansetron (ZOFRAN-ODT) 4 MG disintegrating tablet Take 1 tablet (4 mg total) by mouth every 6 (six) hours as needed for nausea. 30 tablet 0   pantoprazole (PROTONIX) 40 MG tablet TAKE 1 TABLET BY MOUTH DAILY 90 tablet 0   potassium chloride SA (KLOR-CON M) 20 MEQ tablet TAKE 1 TABLET BY MOUTH DAILY FOR POTASSIUM REPLACEMENT/  SUPPLEMENT 90 tablet 0   promethazine (PHENERGAN) 25 MG tablet Take 1 tablet (25 mg total) by mouth every 6 (six) hours as needed for up to 20 doses for nausea or vomiting. 20 tablet 0   sildenafil (REVATIO) 20 MG tablet Take 2-5 pills at once, orally, with each sexual encounter 50 tablet 5   tamsulosin (FLOMAX) 0.4  MG CAPS capsule TAKE 1 CAPSULE BY MOUTH AT  BEDTIME 90 capsule 0   vitamin B-12 (CYANOCOBALAMIN) 500 MCG tablet Take 500 mcg by mouth daily.     No current facility-administered medications for this visit.   Allergies:  Ciprofloxacin, Definity [perflutren lipid microsphere], and Levofloxacin [levofloxacin]   ROS: No orthopnea or PND.  Physical Exam: VS:  BP (!) 158/72    Pulse (!) 59    Ht 6\' 1"  (1.854 m)    Wt 278 lb 6.4 oz (126.3 kg)    SpO2 98%    BMI 36.73 kg/m ,  BMI Body mass index is 36.73 kg/m.  Wt Readings from Last 3 Encounters:  02/23/22 278 lb 6.4 oz (126.3 kg)  02/08/22 285 lb (129.3 kg)  02/03/22 294 lb 15.6 oz (133.8 kg)    General: Patient appears comfortable at rest. HEENT: Conjunctiva and lids normal, wearing a mask. Neck: Supple, no elevated JVP or carotid bruits, no thyromegaly. Lungs: Clear to auscultation, nonlabored breathing at rest. Cardiac: Regular rate and rhythm, no S3 or significant systolic murmur, no pericardial rub. Extremities: No pitting edema.  ECG:  An ECG dated 02/10/2022 was personally reviewed today and demonstrated:  Atrial flutter with 3:1 block and IVCD.  Recent Labwork: 10/08/2021: ALT 31; AST 32 02/10/2022: B Natriuretic Peptide 59.4; BUN 24; Creatinine, Ser 1.21; Hemoglobin 14.9; Platelets 182; Potassium 4.8; Sodium 130     Component Value Date/Time   CHOL 103 08/03/2021 1017   TRIG 87 08/03/2021 1017   HDL 41 08/03/2021 1017   CHOLHDL 2.5 08/03/2021 1017   CHOLHDL 11.7 10/22/2011 1030   VLDL 39 10/22/2011 1030   LDLCALC 45 08/03/2021 1017    Other Studies Reviewed Today:  Echocardiogram 04/06/2021:  1. Left ventricular ejection fraction, by estimation, is 60 to 65%. The  left ventricle has normal function. Left ventricular endocardial border  not optimally defined to evaluate regional wall motion. Left ventricular  diastolic parameters were normal.  The average left ventricular global longitudinal strain is 21.3 %. The   global longitudinal strain is normal.   2. Right ventricular systolic function is normal. The right ventricular  size is normal.   3. Left atrial size was mildly dilated.   4. The mitral valve was not well visualized. No evidence of mitral valve  regurgitation. No evidence of mitral stenosis.   5. The aortic valve is tricuspid. There is mild calcification of the  aortic valve. There is mild thickening of the aortic valve. Aortic valve  regurgitation is not visualized. No aortic stenosis is present.   Chest CTA 02/10/2022: IMPRESSION: No evidence of pulmonary embolism.   Coronary artery disease.   Bronchitic changes with minimal LEFT basilar atelectasis   Aortic Atherosclerosis (ICD10-I70.0).  Head CT 02/10/2022: IMPRESSION: 1. Decreasing acute left subdural hematoma currently measuring 3 mm in maximal diameter, previously 7 mm. Right-sided subdural hematoma has resolved with residual hygroma. 2. Decreasing small volume subarachnoid hemorrhage in both cerebral hemispheres. No new hemorrhage.  Assessment and Plan:  1.  HFrecEF, history of nonischemic cardiomyopathy with normalization of LVEF by echocardiogram in April of last year.  Plan is to obtain a follow-up echocardiogram to ensure no change in LVEF in light of recent events.  He continues on Coreg, lisinopril, Lasix, and potassium supplement.  2.  Recent fall, details not clear but possibly mechanical, cannot completely exclude syncope.  He had resulting bilateral subdural hematomas and subarachnoid hemorrhage, did not require surgery.  Follow-up with neurosurgery pending.  Main question will be whether he would be a candidate in the future for anticoagulation if we determine that he is having recurring atrial flutter.  3.  Recently documented atrial flutter by ECG, asymptomatic and rate controlled.  ECG today shows sinus bradycardia.  Obtain Zio patch for further investigation.  He is currently not a candidate for  anticoagulation given recent traumatic subdural hematomas and subarachnoid hemorrhage.  Hope to get further guidance from neurosurgery, he may ultimately be a candidate for anticoagulation once he has recovered.  If not, can consider referral for Watchman device evaluation.  His CHA2DS2-VASc score is  5.  Medication Adjustments/Labs and Tests Ordered: Current medicines are reviewed at length with the patient today.  Concerns regarding medicines are outlined above.   Tests Ordered: Orders Placed This Encounter  Procedures   EKG 12-Lead   ECHOCARDIOGRAM COMPLETE    Medication Changes: No orders of the defined types were placed in this encounter.   Disposition:  Follow up  6 weeks.  Signed, Satira Sark, MD, [facility] 02/23/2022 12:19 PM    [place] [facility] [place], [place], [place] [address] Phone: [phone]; Fax: [phone]

## 2022-02-23 ENCOUNTER — Ambulatory Visit (INDEPENDENT_AMBULATORY_CARE_PROVIDER_SITE_OTHER): Payer: Medicare Other

## 2022-02-23 ENCOUNTER — Other Ambulatory Visit: Payer: Self-pay | Admitting: Cardiology

## 2022-02-23 ENCOUNTER — Encounter: Payer: Self-pay | Admitting: Cardiology

## 2022-02-23 ENCOUNTER — Ambulatory Visit (INDEPENDENT_AMBULATORY_CARE_PROVIDER_SITE_OTHER): Payer: Medicare Other | Admitting: Cardiology

## 2022-02-23 VITALS — BP 158/72 | HR 59 | Ht 73.0 in | Wt 278.4 lb

## 2022-02-23 DIAGNOSIS — Z8679 Personal history of other diseases of the circulatory system: Secondary | ICD-10-CM

## 2022-02-23 DIAGNOSIS — I483 Typical atrial flutter: Secondary | ICD-10-CM | POA: Diagnosis not present

## 2022-02-23 DIAGNOSIS — I4892 Unspecified atrial flutter: Secondary | ICD-10-CM | POA: Diagnosis not present

## 2022-02-23 DIAGNOSIS — I499 Cardiac arrhythmia, unspecified: Secondary | ICD-10-CM

## 2022-02-23 DIAGNOSIS — I10 Essential (primary) hypertension: Secondary | ICD-10-CM | POA: Diagnosis not present

## 2022-02-23 HISTORY — DX: Cardiac arrhythmia, unspecified: I49.9

## 2022-02-23 NOTE — Addendum Note (Signed)
Addended by: Delsa Sale on: 02/23/2022 01:09 PM ? ? Modules accepted: Orders ? ?

## 2022-02-23 NOTE — Patient Instructions (Addendum)
Medication Instructions:  ?Your physician recommends that you continue on your current medications as directed. Please refer to the Current Medication list given to you today.  ? ?Labwork: ?none ? ?Testing/Procedures: ?Your physician has requested that you have an echocardiogram. Echocardiography is a painless test that uses sound waves to create images of your heart. It provides your doctor with information about the size and shape of your heart and how well your heart?s chambers and valves are working. This procedure takes approximately one hour. There are no restrictions for this procedure. ? ?ZIO- Long Term Monitor Instructions  ? ?Your physician has requested you wear your ZIO patch monitor 7 days.  ? ?Do not shower for the first 24 hours.  You may shower after the first 24 hours. ?  ?Press button if you feel a symptom. You will hear a small click.  Record Date, Time and Symptom in the Patient Log Book. ?  ?When you are ready to remove patch, follow instructions on last 2 pages of Patient Log Book.  Stick patch monitor onto last page of Patient Log Book. ?  ?Place Patient Log Book in [facility] box.  Use locking tab on box and tape box closed securely.  The [employer] and [employer] has [employer] on it.  Please place in mailbox as soon as possible.  Your physician should have your test results approximately 7 days after the monitor has been mailed back to [facility]. ?  ?Call [facility] at [phone] if you have questions regarding your ZIO XT patch monitor.  Call them immediately if you see an orange light blinking on your monitor. ?  ?If your monitor falls off in less than 4 days contact our Monitor department at [phone].  If your monitor becomes loose or falls off after 4 days call [employer] at [phone] for suggestions on securing your monitor. ? ? ?Follow-Up: ?Your physician recommends that you schedule a follow-up appointment in: 6 weeks ? ?Any Other Special Instructions Will  Be Listed Below (If Applicable). ? ?If you need a refill on your cardiac medications before your next appointment, please call your pharmacy. ? ?

## 2022-02-23 NOTE — Addendum Note (Signed)
Addended by: Sung Amabile on: 02/23/2022 01:12 PM ? ? Modules accepted: Orders ? ?

## 2022-03-02 DIAGNOSIS — S065XAA Traumatic subdural hemorrhage with loss of consciousness status unknown, initial encounter: Secondary | ICD-10-CM | POA: Diagnosis not present

## 2022-03-02 DIAGNOSIS — Z8679 Personal history of other diseases of the circulatory system: Secondary | ICD-10-CM | POA: Diagnosis not present

## 2022-03-02 DIAGNOSIS — Z9181 History of falling: Secondary | ICD-10-CM | POA: Diagnosis not present

## 2022-03-07 DIAGNOSIS — I4892 Unspecified atrial flutter: Secondary | ICD-10-CM | POA: Diagnosis not present

## 2022-03-08 ENCOUNTER — Ambulatory Visit (INDEPENDENT_AMBULATORY_CARE_PROVIDER_SITE_OTHER): Payer: Medicare Other

## 2022-03-08 ENCOUNTER — Other Ambulatory Visit: Payer: Self-pay

## 2022-03-08 DIAGNOSIS — I428 Other cardiomyopathies: Secondary | ICD-10-CM

## 2022-03-08 DIAGNOSIS — Z8679 Personal history of other diseases of the circulatory system: Secondary | ICD-10-CM | POA: Diagnosis not present

## 2022-03-08 LAB — ECHOCARDIOGRAM COMPLETE
AR max vel: 2.93 cm2
AV Area VTI: 2.86 cm2
AV Area mean vel: 3.13 cm2
AV Mean grad: 6.4 mmHg
AV Peak grad: 11.7 mmHg
Ao pk vel: 1.71 m/s
Area-P 1/2: 4.39 cm2
S' Lateral: 3.53 cm

## 2022-03-18 DIAGNOSIS — I672 Cerebral atherosclerosis: Secondary | ICD-10-CM | POA: Diagnosis not present

## 2022-03-18 DIAGNOSIS — S065XAA Traumatic subdural hemorrhage with loss of consciousness status unknown, initial encounter: Secondary | ICD-10-CM | POA: Diagnosis not present

## 2022-03-18 DIAGNOSIS — I6200 Nontraumatic subdural hemorrhage, unspecified: Secondary | ICD-10-CM | POA: Diagnosis not present

## 2022-03-18 DIAGNOSIS — Z8679 Personal history of other diseases of the circulatory system: Secondary | ICD-10-CM | POA: Diagnosis not present

## 2022-03-19 ENCOUNTER — Other Ambulatory Visit: Payer: Self-pay | Admitting: Family Medicine

## 2022-03-19 DIAGNOSIS — N400 Enlarged prostate without lower urinary tract symptoms: Secondary | ICD-10-CM

## 2022-03-19 DIAGNOSIS — M1A00X0 Idiopathic chronic gout, unspecified site, without tophus (tophi): Secondary | ICD-10-CM

## 2022-03-19 DIAGNOSIS — I10 Essential (primary) hypertension: Secondary | ICD-10-CM

## 2022-03-23 ENCOUNTER — Ambulatory Visit (INDEPENDENT_AMBULATORY_CARE_PROVIDER_SITE_OTHER): Payer: Medicare Other | Admitting: Family Medicine

## 2022-03-23 ENCOUNTER — Encounter: Payer: Self-pay | Admitting: Family Medicine

## 2022-03-23 VITALS — BP 144/58 | HR 55 | Temp 97.2°F | Ht 73.0 in | Wt 301.2 lb

## 2022-03-23 DIAGNOSIS — M1A00X0 Idiopathic chronic gout, unspecified site, without tophus (tophi): Secondary | ICD-10-CM

## 2022-03-23 DIAGNOSIS — E782 Mixed hyperlipidemia: Secondary | ICD-10-CM | POA: Diagnosis not present

## 2022-03-23 DIAGNOSIS — N400 Enlarged prostate without lower urinary tract symptoms: Secondary | ICD-10-CM

## 2022-03-23 DIAGNOSIS — I10 Essential (primary) hypertension: Secondary | ICD-10-CM

## 2022-03-23 DIAGNOSIS — Z23 Encounter for immunization: Secondary | ICD-10-CM | POA: Diagnosis not present

## 2022-03-23 DIAGNOSIS — D500 Iron deficiency anemia secondary to blood loss (chronic): Secondary | ICD-10-CM

## 2022-03-23 DIAGNOSIS — D649 Anemia, unspecified: Secondary | ICD-10-CM | POA: Diagnosis not present

## 2022-03-23 LAB — CBC WITH DIFFERENTIAL/PLATELET
Basophils Absolute: 0 10*3/uL (ref 0.0–0.2)
Basos: 1 %
EOS (ABSOLUTE): 0.2 10*3/uL (ref 0.0–0.4)
Eos: 7 %
Hematocrit: 37.8 % (ref 37.5–51.0)
Hemoglobin: 12.9 g/dL — ABNORMAL LOW (ref 13.0–17.7)
Immature Grans (Abs): 0 10*3/uL (ref 0.0–0.1)
Immature Granulocytes: 1 %
Lymphocytes Absolute: 0.7 10*3/uL (ref 0.7–3.1)
Lymphs: 21 %
MCH: 33.7 pg — ABNORMAL HIGH (ref 26.6–33.0)
MCHC: 34.1 g/dL (ref 31.5–35.7)
MCV: 99 fL — ABNORMAL HIGH (ref 79–97)
Monocytes Absolute: 0.4 10*3/uL (ref 0.1–0.9)
Monocytes: 11 %
Neutrophils Absolute: 1.9 10*3/uL (ref 1.4–7.0)
Neutrophils: 59 %
Platelets: 126 10*3/uL — ABNORMAL LOW (ref 150–450)
RBC: 3.83 x10E6/uL — ABNORMAL LOW (ref 4.14–5.80)
RDW: 14.3 % (ref 11.6–15.4)
WBC: 3.2 10*3/uL — ABNORMAL LOW (ref 3.4–10.8)

## 2022-03-23 LAB — CMP14+EGFR
ALT: 38 IU/L (ref 0–44)
AST: 32 IU/L (ref 0–40)
Albumin/Globulin Ratio: 1.9 (ref 1.2–2.2)
Albumin: 4 g/dL (ref 3.8–4.8)
Alkaline Phosphatase: 62 IU/L (ref 44–121)
BUN/Creatinine Ratio: 26 — ABNORMAL HIGH (ref 10–24)
BUN: 26 mg/dL (ref 8–27)
Bilirubin Total: 0.6 mg/dL (ref 0.0–1.2)
CO2: 25 mmol/L (ref 20–29)
Calcium: 9.1 mg/dL (ref 8.6–10.2)
Chloride: 105 mmol/L (ref 96–106)
Creatinine, Ser: 1 mg/dL (ref 0.76–1.27)
Globulin, Total: 2.1 g/dL (ref 1.5–4.5)
Glucose: 151 mg/dL — ABNORMAL HIGH (ref 70–99)
Potassium: 4.4 mmol/L (ref 3.5–5.2)
Sodium: 141 mmol/L (ref 134–144)
Total Protein: 6.1 g/dL (ref 6.0–8.5)
eGFR: 82 mL/min/{1.73_m2} (ref >59)

## 2022-03-23 LAB — LIPID PANEL
Chol/HDL Ratio: 2.8 ratio (ref 0.0–5.0)
Cholesterol, Total: 106 mg/dL (ref 100–199)
HDL: 38 mg/dL — ABNORMAL LOW (ref >39)
LDL Chol Calc (NIH): 40 mg/dL (ref 0–99)
Triglycerides: 172 mg/dL — ABNORMAL HIGH (ref 0–149)
VLDL Cholesterol Cal: 28 mg/dL (ref 5–40)

## 2022-03-23 MED ORDER — CALCITRIOL 0.25 MCG PO CAPS: 0.2500 ug | ORAL_CAPSULE | Freq: Every day | ORAL | 3 refills | Status: DC

## 2022-03-23 MED ORDER — FLUOCINOLONE ACETONIDE 0.01 % EX SOLN: Freq: Two times a day (BID) | CUTANEOUS | 5 refills | Status: DC

## 2022-03-23 MED ORDER — PANTOPRAZOLE SODIUM 40 MG PO TBEC: 40.0000 mg | DELAYED_RELEASE_TABLET | Freq: Every day | ORAL | 2 refills | Status: DC

## 2022-03-23 MED ORDER — DULOXETINE HCL 30 MG PO CPEP: 30.0000 mg | ORAL_CAPSULE | Freq: Two times a day (BID) | ORAL | 2 refills | Status: DC

## 2022-03-23 MED ORDER — AMLODIPINE BESYLATE 5 MG PO TABS: 5.0000 mg | ORAL_TABLET | Freq: Every day | ORAL | 2 refills | Status: DC

## 2022-03-23 MED ORDER — CELECOXIB 200 MG PO CAPS: ORAL_CAPSULE | ORAL | 2 refills | Status: DC

## 2022-03-23 MED ORDER — METFORMIN HCL ER 500 MG PO TB24: 500.0000 mg | ORAL_TABLET | Freq: Every day | ORAL | 3 refills | Status: DC

## 2022-03-23 MED ORDER — FUROSEMIDE 40 MG PO TABS: 40.0000 mg | ORAL_TABLET | Freq: Every morning | ORAL | 2 refills | Status: DC

## 2022-03-23 MED ORDER — TRAMADOL HCL 50 MG PO TABS: 50.0000 mg | ORAL_TABLET | Freq: Four times a day (QID) | ORAL | 1 refills | Status: AC | PRN

## 2022-03-23 MED ORDER — ALLOPURINOL 300 MG PO TABS: 300.0000 mg | ORAL_TABLET | Freq: Every morning | ORAL | 2 refills | Status: DC

## 2022-03-23 MED ORDER — POTASSIUM CHLORIDE CRYS ER 20 MEQ PO TBCR: EXTENDED_RELEASE_TABLET | ORAL | 2 refills | Status: DC

## 2022-03-23 MED ORDER — TAMSULOSIN HCL 0.4 MG PO CAPS: 0.4000 mg | ORAL_CAPSULE | Freq: Every day | ORAL | 2 refills | Status: DC

## 2022-03-23 MED ORDER — COLCHICINE 0.6 MG PO CAPS: 1.0000 | ORAL_CAPSULE | Freq: Every day | ORAL | 2 refills | Status: DC

## 2022-03-23 NOTE — Progress Notes (Signed)
? ?Subjective:  ?Patient ID: Mitchell Parker., male    DOB: Jun 13, 1951  Age: 71 y.o. MRN: [id number] ? ?CC: Medical Management of Chronic Issues ? ? ?HPI ?Mitchell Parker. presents for fell on Feb. 9. Hit the back of his head and suffered a brain bleed. Admitted to hospital. Seeing neurosurgery for brain bleeds next week. Having extremely painful HA daily.   ? ? ?  03/23/2022  ?  8:10 AM 10/08/2021  ? 10:38 AM 10/05/2021  ? 11:01 AM  ?Depression screen PHQ 2/9  ?Decreased Interest 1 0 0  ?Down, Depressed, Hopeless 1 0 0  ?PHQ - 2 Score 2 0 0  ?Altered sleeping 1    ?Tired, decreased energy 1    ?Change in appetite 0    ?Feeling bad or failure about yourself  0    ?Trouble concentrating 1    ?Moving slowly or fidgety/restless 0    ?Suicidal thoughts 0    ?PHQ-9 Score 5    ?Difficult doing work/chores Very difficult    ? ? ?History ?Mitchell Parker has a past medical history of Chronic diarrhea, CKD (chronic kidney disease) stage 3, GFR 30-59 ml/min (HCC), COPD (chronic obstructive pulmonary disease) ([place]), DVT (deep venous thrombosis) ([place]), Essential hypertension, Gallstones, Gout, History of colonic polyps, History of DVT (deep vein thrombosis), Kidney stones, Lumbar disc disease, Neuropathy, Nonischemic cardiomyopathy ([place]), Obesity, Orthostatic hypotension, RA (rheumatoid arthritis) ([place]), Sleep apnea, Subarachnoid hemorrhage ([place]), Subdural hematoma, Syncope, vasovagal, and Type 2 diabetes mellitus ([place]).  ? ?He has a past surgical history that includes Umbilical hernia repair; Pilonidal cyst excision (1997); Varicose vein surgery; Cholecystectomy (01/18/2012); Bariatric Surgery (01/11/2018); and Eye surgery.  ? ?His family history includes Diabetes in his brother and father; Heart disease in his father; Hypertension in his father and mother; Stroke in his mother.He reports that he quit smoking about 27 years ago. His smoking use included cigarettes. He has never used smokeless tobacco. He reports that he does not drink  alcohol and does not use drugs. ? ? ? ?ROS ?Review of Systems  ?HENT:  Positive for ear pain (itching from chronic eczema in canals).   ?Genitourinary:  Negative for urgency.  ?Neurological:  Positive for light-headedness (with bending over).  ? ?Objective:  ?BP (!) 144/58   Pulse (!) 55   Temp (!) 97.2 ?F (36.2 ?C)   Ht 6' 1"  (1.854 m)   Wt (!) 301 lb 3.2 oz (136.6 kg)   SpO2 100%   BMI 39.74 kg/m?  ? ?BP Readings from Last 3 Encounters:  ?03/23/22 (!) 144/58  ?02/23/22 (!) 158/72  ?02/11/22 (!) 176/61  ? ? ?Wt Readings from Last 3 Encounters:  ?03/23/22 (!) 301 lb 3.2 oz (136.6 kg)  ?02/23/22 278 lb 6.4 oz (126.3 kg)  ?02/08/22 285 lb (129.3 kg)  ? ? ? ?Physical Exam ?Vitals reviewed.  ?Constitutional:   ?   Appearance: He is well-developed.  ?HENT:  ?   Head: Normocephalic and atraumatic.  ?   Right Ear: External ear normal.  ?   Left Ear: External ear normal.  ?   Mouth/Throat:  ?   Pharynx: No oropharyngeal exudate or posterior oropharyngeal erythema.  ?Eyes:  ?   Pupils: Pupils are equal, round, and reactive to light.  ?Cardiovascular:  ?   Rate and Rhythm: Normal rate and regular rhythm.  ?   Heart sounds: No murmur heard. ?Pulmonary:  ?   Effort: No respiratory distress.  ?   Breath sounds: Normal  breath sounds.  ?Musculoskeletal:     ?   General: Normal range of motion.  ?   Cervical back: Normal range of motion and neck supple.  ?Skin: ?   General: Skin is dry.  ?Neurological:  ?   Mental Status: He is alert and oriented to person, place, and time.  ?   Cranial Nerves: No cranial nerve deficit.  ?   Coordination: Coordination abnormal (slightly unsteady with standing up).  ? ? ? ? ?Assessment & Plan:  ? ?Mitchell Parker was seen today for medical management of chronic issues. ? ?Diagnoses and all orders for this visit: ? ?Mixed hyperlipidemia ?-     Lipid panel ? ?Primary hypertension ?-     CMP14+EGFR ? ?Iron deficiency anemia due to chronic blood loss ?-     CBC with Differential/Platelet ? ?Idiopathic  chronic gout without tophus, unspecified site ?-     allopurinol (ZYLOPRIM) 300 MG tablet; Take 1 tablet (300 mg total) by mouth every morning. ?-     Colchicine 0.6 MG CAPS; Take 1 capsule by mouth daily. ? ?Essential hypertension ?-     amLODipine (NORVASC) 5 MG tablet; Take 1 tablet (5 mg total) by mouth daily. for blood pressure ? ?Benign prostatic hyperplasia without lower urinary tract symptoms ?-     tamsulosin (FLOMAX) 0.4 MG CAPS capsule; Take 1 capsule (0.4 mg total) by mouth at bedtime. ? ?Need for shingles vaccine ?-     Varicella-zoster vaccine IM (Shingrix) ? ?Other orders ?-     calcitRIOL (ROCALTROL) 0.25 MCG capsule; Take 1 capsule (0.25 mcg total) by mouth daily with breakfast. ?-     celecoxib (CELEBREX) 200 MG capsule; TAKE 1 CAPSULE BY MOUTH DAILY  WITH FOOD ?-     DULoxetine (CYMBALTA) 30 MG capsule; Take 1 capsule (30 mg total) by mouth 2 (two) times daily. ?-     furosemide (LASIX) 40 MG tablet; Take 1 tablet (40 mg total) by mouth every morning. ?-     metFORMIN (GLUCOPHAGE-XR) 500 MG 24 hr tablet; Take 1 tablet (500 mg total) by mouth daily with breakfast. ?-     pantoprazole (PROTONIX) 40 MG tablet; Take 1 tablet (40 mg total) by mouth daily. ?-     potassium chloride SA (KLOR-CON M) 20 MEQ tablet; TAKE 1 TABLET BY MOUTH DAILY FOR POTASSIUM REPLACEMENT/  SUPPLEMENT ?-     traMADol (ULTRAM) 50 MG tablet; Take 1 tablet (50 mg total) by mouth 4 (four) times daily as needed for up to 5 days for moderate pain. ?-     fluocinolone (SYNALAR) 0.01 % external solution; Apply topically 2 (two) times daily. Place 3-5 drops in each ear canal. ? ? ? ? ? ? ?I have discontinued Mitchell Parker. "Mitchell Parker"'s levETIRAcetam and promethazine. I have also changed his allopurinol, amLODipine, calcitRIOL, DULoxetine, furosemide, metFORMIN, pantoprazole, and tamsulosin. Additionally, I am having him start on traMADol and fluocinolone. Lastly, I am having him maintain his multivitamin, calcium carbonate, vitamin  B-12, acetaminophen, fluticasone, hydroxychloroquine, lisinopril, ferrous sulfate, atorvastatin, carvedilol, sildenafil, ondansetron, celecoxib, Colchicine, and potassium chloride SA. ? ?Allergies as of 03/23/2022   ? ?   Reactions  ? Ciprofloxacin Hives  ? Definity [perflutren Lipid Microsphere] Nausea And Vomiting  ? During ECHO with Definity injection pt became nauseated and vomited   ? Levofloxacin [levofloxacin] Hives  ? ?  ? ?  ?Medication List  ?  ? ?  ? Accurate as of March 23, 2022  8:49 AM.  If you have any questions, ask your nurse or doctor.  ?  ?  ? ?  ? ?STOP taking these medications   ? ?levETIRAcetam 500 MG tablet ?Commonly known as: KEPPRA ?Stopped by: [name], MD ?  ?promethazine 25 MG tablet ?Commonly known as: PHENERGAN ?Stopped by: [name], MD ?  ? ?  ? ?TAKE these medications   ? ?acetaminophen 500 MG tablet ?Commonly known as: TYLENOL ?Take 500 mg by mouth every 6 (six) hours as needed for mild pain. ?  ?allopurinol 300 MG tablet ?Commonly known as: ZYLOPRIM ?Take 1 tablet (300 mg total) by mouth every morning. ?  ?amLODipine 5 MG tablet ?Commonly known as: NORVASC ?Take 1 tablet (5 mg total) by mouth daily. for blood pressure ?  ?atorvastatin 80 MG tablet ?Commonly known as: LIPITOR ?TAKE 1 TABLET BY MOUTH  DAILY AT 6 PM ?What changed:  ?how much to take ?how to take this ?when to take this ?additional instructions ?  ?calcitRIOL 0.25 MCG capsule ?Commonly known as: ROCALTROL ?Take 1 capsule (0.25 mcg total) by mouth daily with breakfast. ?What changed: when to take this ?  ?calcium carbonate 1500 (600 Ca) MG Tabs tablet ?Commonly known as: OSCAL ?Take 600 mg of elemental calcium by mouth 2 (two) times daily with a meal. ?  ?carvedilol 6.25 MG tablet ?Commonly known as: COREG ?Take 0.5 tablets (3.125 mg total) by mouth 2 (two) times daily. ?What changed:  ?how much to take ?when to take this ?  ?celecoxib 200 MG capsule ?Commonly known as: CELEBREX ?TAKE 1 CAPSULE BY MOUTH DAILY   WITH FOOD ?  ?Colchicine 0.6 MG Caps ?Take 1 capsule by mouth daily. ?  ?DULoxetine 30 MG capsule ?Commonly known as: CYMBALTA ?Take 1 capsule (30 mg total) by mouth 2 (two) times daily. ?  ?ferrous sulfate 325 (65 FE

## 2022-03-25 ENCOUNTER — Other Ambulatory Visit: Payer: Self-pay | Admitting: Family Medicine

## 2022-03-25 ENCOUNTER — Telehealth: Payer: Self-pay | Admitting: *Deleted

## 2022-03-25 MED ORDER — FLUOCINOLONE ACETONIDE 0.01 % EX SOLN: CUTANEOUS | 5 refills | Status: DC

## 2022-03-25 NOTE — Telephone Encounter (Signed)
Fax from [facility] ?RE: Synalar Sol 0.01% ?Please clarify directions and send in [place] [facility] ?Directions from 03/23/22 Rx: Sig: Apply topically 2 (two) times daily. Place 3-5 drops in each ear canal. ?

## 2022-03-25 NOTE — Telephone Encounter (Signed)
Please let the patient know that I sent their prescription to their pharmacy. Thanks, WS 

## 2022-03-28 DIAGNOSIS — Z9181 History of falling: Secondary | ICD-10-CM | POA: Diagnosis not present

## 2022-03-28 DIAGNOSIS — S065XAA Traumatic subdural hemorrhage with loss of consciousness status unknown, initial encounter: Secondary | ICD-10-CM | POA: Diagnosis not present

## 2022-03-28 DIAGNOSIS — Z8679 Personal history of other diseases of the circulatory system: Secondary | ICD-10-CM | POA: Diagnosis not present

## 2022-03-29 ENCOUNTER — Encounter: Payer: Self-pay | Admitting: Cardiology

## 2022-03-29 ENCOUNTER — Ambulatory Visit (INDEPENDENT_AMBULATORY_CARE_PROVIDER_SITE_OTHER): Payer: Medicare Other | Admitting: Cardiology

## 2022-03-29 VITALS — BP 148/50 | HR 68 | Ht 73.0 in | Wt 304.4 lb

## 2022-03-29 DIAGNOSIS — I483 Typical atrial flutter: Secondary | ICD-10-CM

## 2022-03-29 DIAGNOSIS — I428 Other cardiomyopathies: Secondary | ICD-10-CM | POA: Diagnosis not present

## 2022-03-29 LAB — SPECIMEN STATUS REPORT

## 2022-03-29 LAB — B12 AND FOLATE PANEL
Folate: >20 ng/mL (ref >3.0)
Vitamin B-12: 1565 pg/mL — ABNORMAL HIGH (ref 232–1245)

## 2022-03-29 NOTE — Progress Notes (Signed)
? ? ?Cardiology Office Note ? ?Date: 03/29/2022  ? ?ID: Mitchell Bertrand., DOB [birth year], MRN [id number] ? ?PCP:  [name], MD  ?Cardiologist:  Rozann Lesches, MD ?Electrophysiologist:  None  ? ?Chief Complaint  ?Patient presents with  ? Cardiac follow-up  ? ? ?History of Present Illness: ?Mitchell Parker. is a 71 y.o. male seen recently in March.  He is here for a follow-up visit.  Reports no chest pain or interval palpitations.  Still having intermittent headaches.  He has had follow-up with neurosurgery, still has residual subdural hematoma by follow-up head CT although previously noted left subdural and subarachnoid hemorrhage has resolved.  He has reimaging scheduled in 3 weeks. ? ?Interval cardiac monitor and echocardiogram results are noted below.  Fortunately, LVEF remains normal at 60 to 65%.  He does have paroxysmal SVT and atrial flutter which was suspected previously.  At this point we are holding off on anticoagulation.  He is asymptomatic in terms of palpitations. ? ?I reviewed his medications which are noted below. ? ?Past Medical History:  ?Diagnosis Date  ? Chronic diarrhea   ? CKD (chronic kidney disease) stage 3, GFR 30-59 ml/min (HCC)   ? COPD (chronic obstructive pulmonary disease) ([place])   ? DVT (deep venous thrombosis) ([place])   ? Essential hypertension   ? Gallstones   ? Gout   ? History of colonic polyps   ? History of DVT (deep vein thrombosis)   ? Kidney stones   ? Lumbar disc disease   ? Neuropathy   ? Nonischemic cardiomyopathy ([place])   ? Minor coronary atherosclerosis at cardiac catheterization 2003, LVEF initially 20% with normalization  ? Obesity   ? Orthostatic hypotension   ? RA (rheumatoid arthritis) ([place])   ? Sleep apnea   ? Subarachnoid hemorrhage ([place])   ? Traumatic  ? Subdural hematoma (HCC)   ? Traumatic  ? Syncope, vasovagal   ? Type 2 diabetes mellitus ([facility])   ? ? ?Past Surgical History:  ?Procedure Laterality Date  ? BARIATRIC SURGERY  01/11/2018  ? CHOLECYSTECTOMY   01/18/2012  ? Procedure: LAPAROSCOPIC CHOLECYSTECTOMY WITH INTRAOPERATIVE CHOLANGIOGRAM;  Surgeon: Joyice Faster. Cornett, MD;  Location: [facility];  Service: General;  Laterality: N/A;  ? EYE SURGERY    ? Film over retina was removed.  ? PILONIDAL CYST EXCISION  1997  ? UMBILICAL HERNIA REPAIR    ? VARICOSE VEIN SURGERY    ? left leg  ? ? ?Current Outpatient Medications  ?Medication Sig Dispense Refill  ? acetaminophen (TYLENOL) 500 MG tablet Take 500 mg by mouth every 6 (six) hours as needed for mild pain.    ? allopurinol (ZYLOPRIM) 300 MG tablet Take 1 tablet (300 mg total) by mouth every morning. 90 tablet 2  ? amLODipine (NORVASC) 5 MG tablet Take 1 tablet (5 mg total) by mouth daily. for blood pressure 90 tablet 2  ? atorvastatin (LIPITOR) 80 MG tablet TAKE 1 TABLET BY MOUTH  DAILY AT 6 PM 90 tablet 3  ? calcitRIOL (ROCALTROL) 0.25 MCG capsule Take 1 capsule (0.25 mcg total) by mouth daily with breakfast. 90 capsule 3  ? calcium carbonate (OSCAL) 1500 (600 Ca) MG TABS tablet Take 600 mg of elemental calcium by mouth 2 (two) times daily with a meal.    ? carvedilol (COREG) 6.25 MG tablet Take 0.5 tablets (3.125 mg total) by mouth 2 (two) times daily. 180 tablet 3  ? celecoxib (CELEBREX) 200 MG capsule TAKE 1 CAPSULE BY  MOUTH DAILY  WITH FOOD 90 capsule 2  ? Colchicine 0.6 MG CAPS Take 1 capsule by mouth daily. 90 capsule 2  ? DULoxetine (CYMBALTA) 30 MG capsule Take 1 capsule (30 mg total) by mouth 2 (two) times daily. 180 capsule 2  ? ferrous sulfate 325 (65 FE) MG tablet Take 325 mg by mouth 2 (two) times daily with a meal.    ? fluocinolone (SYNALAR) 0.01 % external solution Place 3-5 drops in each ear canal twice daily 60 mL 5  ? fluticasone (FLONASE) 50 MCG/ACT nasal spray USE 2 SPRAYS IN BOTH  NOSTRILS DAILY AS NEEDED  FOR ALLERGIES OR RHINITIS 48 g 2  ? furosemide (LASIX) 40 MG tablet Take 1 tablet (40 mg total) by mouth every morning. 90 tablet 2  ? hydroxychloroquine (PLAQUENIL) 200 MG tablet Take 1 tablet by  mouth in the morning and at bedtime.    ? lisinopril (ZESTRIL) 20 MG tablet TAKE 1 TABLET BY MOUTH  DAILY 90 tablet 3  ? metFORMIN (GLUCOPHAGE-XR) 500 MG 24 hr tablet Take 1 tablet (500 mg total) by mouth daily with breakfast. 90 tablet 3  ? Multiple Vitamin (MULTIVITAMIN) capsule Take 1 capsule by mouth daily.    ? ondansetron (ZOFRAN-ODT) 4 MG disintegrating tablet Take 1 tablet (4 mg total) by mouth every 6 (six) hours as needed for nausea. 30 tablet 0  ? pantoprazole (PROTONIX) 40 MG tablet Take 1 tablet (40 mg total) by mouth daily. 90 tablet 2  ? potassium chloride SA (KLOR-CON M) 20 MEQ tablet TAKE 1 TABLET BY MOUTH DAILY FOR POTASSIUM REPLACEMENT/  SUPPLEMENT 90 tablet 2  ? sildenafil (REVATIO) 20 MG tablet Take 2-5 pills at once, orally, with each sexual encounter 50 tablet 5  ? tamsulosin (FLOMAX) 0.4 MG CAPS capsule Take 1 capsule (0.4 mg total) by mouth at bedtime. 90 capsule 2  ? vitamin B-12 (CYANOCOBALAMIN) 500 MCG tablet Take 500 mcg by mouth daily.    ? ?No current facility-administered medications for this visit.  ? ?Allergies:  Ciprofloxacin, Definity [perflutren lipid microsphere], and Levofloxacin [levofloxacin]  ? ?ROS: No orthopnea or PND. ? ?Physical Exam: ?VS:  BP (!) 148/50   Pulse 68   Ht 6\' 1"  (1.854 m)   Wt (!) 304 lb 6.4 oz (138.1 kg)   SpO2 98%   BMI 40.16 kg/m? , BMI Body mass index is 40.16 kg/m?. ? ?Wt Readings from Last 3 Encounters:  ?03/29/22 (!) 304 lb 6.4 oz (138.1 kg)  ?03/23/22 (!) 301 lb 3.2 oz (136.6 kg)  ?02/23/22 278 lb 6.4 oz (126.3 kg)  ?  ?General: Patient appears comfortable at rest. ?Lungs: Clear to auscultation, nonlabored breathing at rest. ?Cardiac: Regular rate and rhythm, no S3 or significant systolic murmur, no pericardial rub. ?Extremities: No pitting edema. ? ?ECG:  An ECG dated 02/23/2022 was personally reviewed today and demonstrated:  Sinus bradycardia with IVCD. ? ?Recent Labwork: ?02/10/2022: B Natriuretic Peptide 59.4 ?03/23/2022: ALT 38; AST 32; BUN  26; Creatinine, Ser 1.00; Hemoglobin 12.9; Platelets 126; Potassium 4.4; Sodium 141  ?   ?Component Value Date/Time  ? CHOL 106 03/23/2022 0855  ? TRIG 172 (H) 03/23/2022 0855  ? HDL 38 (L) 03/23/2022 0855  ? CHOLHDL 2.8 03/23/2022 0855  ? CHOLHDL 11.7 10/22/2011 1030  ? VLDL 39 10/22/2011 1030  ? Burt 40 03/23/2022 0855  ? ? ?Other Studies Reviewed Today: ? ?Echocardiogram 03/08/2022: ? 1. Left ventricular ejection fraction, by estimation, is 60 to 65%. The  ?left ventricle  has normal function. Left ventricular endocardial border  ?not optimally defined to evaluate regional wall motion. There is mild left  ?ventricular hypertrophy. Left  ?ventricular diastolic parameters were normal.  ? 2. Right ventricular systolic function is normal. The right ventricular  ?size is normal. Tricuspid regurgitation signal is inadequate for assessing  ?PA pressure.  ? 3. The mitral valve is normal in structure. No evidence of mitral valve  ?regurgitation. No evidence of mitral stenosis.  ? 4. The aortic valve was not well visualized. Aortic valve regurgitation  ?is not visualized. No aortic stenosis is present.  ? 5. Aortic dilatation noted. There is mild dilatation of the ascending  ?aorta, measuring 38 mm.  ? 6. The inferior vena cava is normal in size with greater than 50%  ?respiratory variability, suggesting right atrial pressure of 3 mmHg.  ? 7. Possible small PFO with mild left to right shunt.  ? ?Cardiac monitor March 2023: ?ZIO XT reviewed.  5 days, 9 hours analyzed.  Predominant rhythm is sinus with heart rate ranging from 48 bpm up to 109 bpm and average heart rate 60 bpm. ?  ?There were rare PACs including atrial couplets and triplets representing less than 1% total beats.  There were rare PVCs including ventricular couplets representing less than 1% total beats.  Ventricular bigeminy was also noted. ?  ?Episodes of SVT were noted, more specifically atrial flutter with variable conduction was noted approximately 17% of  the time.  Average heart rate was in the 70s.  Longest episode lasted for 22 hours and 33 minutes. ? ?Assessment and Plan: ? ?1.  Paroxysmal SVT and atrial flutter with variable conduction by recent cardiac mon

## 2022-03-29 NOTE — Patient Instructions (Addendum)
Medication Instructions:  ?Your physician recommends that you continue on your current medications as directed. Please refer to the Current Medication list given to you today. ? ?Labwork: ?none ? ?Testing/Procedures: ?none ? ?Follow-Up: ?Your physician recommends that you schedule a follow-up appointment in: 6 week ? ?Any Other Special Instructions Will Be Listed Below (If Applicable). ? ?If you need a refill on your cardiac medications before your next appointment, please call your pharmacy. ?

## 2022-04-12 ENCOUNTER — Ambulatory Visit: Payer: Medicare Other | Admitting: Cardiology

## 2022-04-21 DIAGNOSIS — S065XAA Traumatic subdural hemorrhage with loss of consciousness status unknown, initial encounter: Secondary | ICD-10-CM | POA: Diagnosis not present

## 2022-04-21 DIAGNOSIS — I10 Essential (primary) hypertension: Secondary | ICD-10-CM | POA: Diagnosis not present

## 2022-04-21 DIAGNOSIS — Z79899 Other long term (current) drug therapy: Secondary | ICD-10-CM | POA: Diagnosis not present

## 2022-04-21 DIAGNOSIS — E1122 Type 2 diabetes mellitus with diabetic chronic kidney disease: Secondary | ICD-10-CM | POA: Diagnosis not present

## 2022-04-21 DIAGNOSIS — M069 Rheumatoid arthritis, unspecified: Secondary | ICD-10-CM | POA: Diagnosis not present

## 2022-04-21 DIAGNOSIS — Z86718 Personal history of other venous thrombosis and embolism: Secondary | ICD-10-CM | POA: Diagnosis not present

## 2022-04-21 DIAGNOSIS — Z9884 Bariatric surgery status: Secondary | ICD-10-CM | POA: Diagnosis not present

## 2022-04-21 DIAGNOSIS — N189 Chronic kidney disease, unspecified: Secondary | ICD-10-CM | POA: Diagnosis not present

## 2022-04-21 DIAGNOSIS — J449 Chronic obstructive pulmonary disease, unspecified: Secondary | ICD-10-CM | POA: Diagnosis not present

## 2022-04-21 DIAGNOSIS — I6201 Nontraumatic acute subdural hemorrhage: Secondary | ICD-10-CM | POA: Diagnosis not present

## 2022-04-21 DIAGNOSIS — Z7984 Long term (current) use of oral hypoglycemic drugs: Secondary | ICD-10-CM | POA: Diagnosis not present

## 2022-04-21 DIAGNOSIS — E785 Hyperlipidemia, unspecified: Secondary | ICD-10-CM | POA: Diagnosis not present

## 2022-04-21 DIAGNOSIS — E1140 Type 2 diabetes mellitus with diabetic neuropathy, unspecified: Secondary | ICD-10-CM | POA: Diagnosis not present

## 2022-04-21 DIAGNOSIS — I509 Heart failure, unspecified: Secondary | ICD-10-CM | POA: Diagnosis not present

## 2022-04-21 DIAGNOSIS — S065X0A Traumatic subdural hemorrhage without loss of consciousness, initial encounter: Secondary | ICD-10-CM | POA: Diagnosis not present

## 2022-04-21 DIAGNOSIS — Z9049 Acquired absence of other specified parts of digestive tract: Secondary | ICD-10-CM | POA: Diagnosis not present

## 2022-04-21 DIAGNOSIS — Z89421 Acquired absence of other right toe(s): Secondary | ICD-10-CM | POA: Diagnosis not present

## 2022-04-21 DIAGNOSIS — I6200 Nontraumatic subdural hemorrhage, unspecified: Secondary | ICD-10-CM | POA: Diagnosis not present

## 2022-04-21 DIAGNOSIS — I130 Hypertensive heart and chronic kidney disease with heart failure and stage 1 through stage 4 chronic kidney disease, or unspecified chronic kidney disease: Secondary | ICD-10-CM | POA: Diagnosis not present

## 2022-04-21 DIAGNOSIS — I6203 Nontraumatic chronic subdural hemorrhage: Secondary | ICD-10-CM | POA: Diagnosis not present

## 2022-04-25 DIAGNOSIS — Z9181 History of falling: Secondary | ICD-10-CM | POA: Diagnosis not present

## 2022-04-29 ENCOUNTER — Other Ambulatory Visit: Payer: Self-pay | Admitting: Neurosurgery

## 2022-04-29 DIAGNOSIS — I6203 Nontraumatic chronic subdural hemorrhage: Secondary | ICD-10-CM | POA: Diagnosis not present

## 2022-05-02 ENCOUNTER — Encounter (HOSPITAL_COMMUNITY): Payer: Self-pay | Admitting: Orthopedic Surgery

## 2022-05-02 ENCOUNTER — Other Ambulatory Visit: Payer: Self-pay

## 2022-05-02 NOTE — Pre-Procedure Instructions (Signed)
?[facility] [place] ?[address] ?[address] ?[place] [address] ?Phone: [phone] Fax: [phone] ? ?[facility] ([facility] ) - [place], [address] ?[address] ?[place] [address] ?Phone: [phone] Fax: [phone] ? ?[id number] [place], [place] [address] ?[address] ?[place] [address] ?Phone: [phone] Fax: [phone] ? ?PCP - [name], MD ?Cardiologist - Satira Sark, MD ? ?CPAP - Wears every night ? ?Fasting Blood Sugar - 100-110 ?Checks Blood Sugar Checks once or twice a week ? ?ERAS Protcol - Clears until 1045 ? ?COVID TEST- N/A ? ?Anesthesia review: Y ? ?Patient verbally denies any shortness of breath, fever, cough and chest pain during phone call ? ? ?-------------  SDW INSTRUCTIONS given: ? ?Your procedure is scheduled on 05/04/22. ? Report to [facility] Main Entrance "A" at 1115 A.M., and check in at the Admitting office. ? Call this number if you have problems the morning of surgery: ? [phone] ? ? Remember: ? Do not eat after midnight the night before your surgery ? ?You may drink clear liquids until 1045 the morning of your surgery.   ?Clear liquids allowed are: Water, Non-Citrus Juices (without pulp), Carbonated Beverages, Clear Tea, Black Coffee Only, and Gatorade ?  ? Take these medicines the morning of surgery with A SIP OF WATER  ?allopurinol (ZYLOPRIM)  ?amLODipine (NORVASC)  ?carvedilol (COREG) ?DULoxetine (CYMBALTA)  ?hydroxychloroquine (PLAQUENIL)  ?pantoprazole (PROTONIX) ?traMADol (ULTRAM)-if needed ?acetaminophen (TYLENOL)-if needed ? ? ?.** PLEASE check your blood sugar the morning of your surgery when you wake up and every 2 hours until you get to the Short Stay unit. ? ?If your blood sugar is less than 70 mg/dL, you will need to treat for low blood sugar: ?Do not take insulin. ?Treat a low blood  sugar (less than 70 mg/dL) with ? cup of clear juice (cranberry or apple), 4 glucose tablets, OR glucose gel. ?Recheck blood sugar in 15 minutes after treatment (to make sure it is greater than 70 mg/dL). If your blood sugar is not greater than 70 mg/dL on recheck, call [phone] for further instructions.  ? ?As of today, STOP taking any Aspirin (unless otherwise instructed by your surgeon) Aleve, Naproxen, Ibuprofen, Motrin, Advil, Goody's, BC's, all herbal medications, fish oil, and all vitamins. ? ?         ?           Do not wear jewelry, make up, or nail polish ?           Do not wear lotions, powders, perfumes/colognes, or deodorant. ?           Do not shave 48 hours prior to surgery.  Men may shave face and neck. ?           Do not bring valuables to the hospital. ?            is not responsible for any belongings or valuables. ? ?Do NOT Smoke (Tobacco/Vaping) 24 hours prior to your procedure ?If you use a CPAP at night, you may bring all equipment for your overnight stay. ?  ?Contacts, glasses, dentures or bridgework may not be worn into surgery.    ?  ?For patients admitted to the hospital, discharge time will be determined by your treatment team. ?  ?Patients discharged the day of surgery will not  be allowed to drive home, and someone needs to stay with them for 24 hours. ? ? ? ?Special instructions:   ?[facility]- Preparing For Surgery ? ?Before surgery, you can play an important role. Because skin is not sterile, your skin needs to be as free of germs as possible. You can reduce the number of germs on your skin by washing with CHG (chlorahexidine gluconate) Soap before surgery.  CHG is an antiseptic cleaner which kills germs and bonds with the skin to continue killing germs even after washing.   ? ?Oral Hygiene is also important to reduce your risk of infection.  Remember - BRUSH YOUR TEETH THE MORNING OF SURGERY WITH YOUR REGULAR TOOTHPASTE ? ?Please do not use if you have an allergy to  CHG or antibacterial soaps. If your skin becomes reddened/irritated stop using the CHG.  ?Do not shave (including legs and underarms) for at least 48 hours prior to first CHG shower. It is OK to shave your face. ? ?Please follow these instructions carefully. ?  ?Shower the NIGHT BEFORE SURGERY and the MORNING OF SURGERY with DIAL Soap.  ? ?Pat yourself dry with a CLEAN TOWEL. ? ?Wear CLEAN PAJAMAS to bed the night before surgery ? ?Place CLEAN SHEETS on your bed the night of your first shower and DO NOT SLEEP WITH PETS. ? ? ?Day of Surgery: ?Please shower morning of surgery  ?Wear Clean/Comfortable clothing the morning of surgery ?Do not apply any deodorants/lotions.   ?Remember to brush your teeth WITH YOUR REGULAR TOOTHPASTE. ?  ?Questions were answered. Patient verbalized understanding of instructions.  ? ? ?    ?

## 2022-05-03 ENCOUNTER — Encounter (HOSPITAL_COMMUNITY): Payer: Self-pay | Admitting: Vascular Surgery

## 2022-05-03 NOTE — Progress Notes (Signed)
Anesthesia Chart Review: ? Case: [id number] Date/Time: 05/04/22 1325  ? Procedure: BURR HOLES FOR SDH (Right)  ? Anesthesia type: General  ? Pre-op diagnosis: SDH, CHRONIC  ? Location: [facility] OR ROOM 19 / [facility] OR  ? Surgeons: Vallarie Mare, MD  ? ?  ? ? ?DISCUSSION: Patient is a 71 year old male scheduled for the above procedure. ? ?On 02/03/22, he was in his garage and the next thing he remembered is waking up on the garage concrete floor. He had hit the back of his head. He went a [facility] where head CT showed bilateral SDH and SAH and no c-spine fractures. He was transferred to [facility] and then to [facility] for admission. He required nicardipine for elevated BP. + Headache. GCS 15. Neurosurgery consulted and recommended medical management including serial head CT scans. He was started on Keppra x 7 days for seizure prophylaxis. 02/03/22 follow-up head CT stable.  He was discharged home on 02/05/22 with [facility] Clinic follow-up.  ? ?[facility] ED visit 02/10/22 for persistent headaches and SOB. D-dimer 1.06. CTA negative for PE. Troponin 71>74. BNP normal. Head CT showed improving left SDH and bilateral SAH, right SDH resolved with residual hygroma. (Left SDH and SAH no longer seen on 03/21/22 CT.)  ? ?[facility] ED visit 04/21/22 after serial CT scanning through neurosurgery showed an increase is the right SDH from 1 cm to 2.2 cm with midline shift from 1 mm to 5 mm.  He was asymptomatic.  Neurosurgery was contacted.  Apparently he admitted to falling off a chair and hitting his head the week prior.  Recommendation to keep systolic blood pressure less than 160.  Given no symptoms, he was discharged with Decadron taper and Keppra and close neurosurgery follow-up. ? ?Other history includes former smoker (quit 12/26/94), COPD, OSA (uses CPAP), non-ischemic cardiomyopathy (2003), HFrEF, HTN (and orthostatic hypotension), CKD, DM2, osteomyelitis (right great toe amputation 10/07/20), vasovagal syncope, chronic  diarrhea, DVT (BLE DVT 04/19/12), gout, varicose veins (s/p surgery), obesity (bariatric surgery 01/11/18), traumatic SDH/SAH (02/03/22), cholecystectomy (01/18/12). Possible small PFO with mild left to right shunt by 03/08/22 echo.  ? ?Last cardiology visit with Dr. Domenic Polite was on 03/29/22.  Recent echo showed continued normal LVEF.  He was noted to have paroxysmal SVT and atrial flutter on recent cardiac monitor but given recent SDH and SAH, no anticoagulation recommended at that time.  May reconsider once neurosurgical status clarified.  He was asymptomatic in terms of palpitations.  Continue medical therapy for CHF. ? ?He is a same day work-up. Labs as indicated and anesthesia team evaluation on the day of surgery. Last labs noted are from 04/21/22 at [facility] (see Care Everywhere).  ? ? ?VS: Ht 6\' 1"  (1.854 m)   Wt 136.1 kg   BMI 39.58 kg/m?  ? ?PROVIDERS: ?[name], MD is PCP ?Rozann Lesches, MD is cardiologist ? ? ?LABS: For day of surgery as indicated. 04/21/22 CMP showed sodium 137, potassium 4.3, BUN 30, creatinine 1.03, glucose 24 calcium 8.4, albumin 3.5, total bilirubin 0.4, AST 52, ALT 36, alkaline phosphatase 61, PT/INR 10.2/1.00. H/H 12.9/37.8, PLT 126 on 03/23/22. A1c 6.7% 02/03/22.  ? ? ?IMAGES: ?CT Head 04/21/22: ?IMPRESSION: ?Significant increased size of an intermediate density right subdural ?hematoma which now measures 2.2 cm in thickness (previously 1 cm). ?Some areas of internal hyperdensity are compatible with ?interval/recent hemorrhage. Resulting increased mass effect with 5 ?mm of leftward midline shift (previously trace). Recommend ?neurosurgical consultation. ? ?CTA Chest  02/10/22: ?IMPRESSION: ?- No evidence of pulmonary embolism. ?- Coronary artery disease. ?- Bronchitic changes with minimal LEFT basilar atelectasis ?- Aortic Atherosclerosis (ICD10-I70.0). ? ?1V PCXR 02/10/22: ?FINDINGS: ?Mild cardiac enlargement. Negative for heart failure or edema. Lungs ?clear without infiltrate  or effusion. ?IMPRESSION: ?No active disease. ?  ? ?EKG: 02/23/22: SB at 59 bpm. Non-specific intraventricular conduction delay. ? ? ?CV: ?Echo 03/08/22: ?IMPRESSIONS  ? 1. Left ventricular ejection fraction, by estimation, is 60 to 65%. The  ?left ventricle has normal function. Left ventricular endocardial border  ?not optimally defined to evaluate regional wall motion. There is mild left  ?ventricular hypertrophy. Left  ?ventricular diastolic parameters were normal.  ? 2. Right ventricular systolic function is normal. The right ventricular  ?size is normal. Tricuspid regurgitation signal is inadequate for assessing  ?PA pressure.  ? 3. The mitral valve is normal in structure. No evidence of mitral valve  ?regurgitation. No evidence of mitral stenosis.  ? 4. The aortic valve was not well visualized. Aortic valve regurgitation  ?is not visualized. No aortic stenosis is present.  ? 5. Aortic dilatation noted. There is mild dilatation of the ascending  ?aorta, measuring 38 mm.  ? 6. The inferior vena cava is normal in size with greater than 50%  ?respiratory variability, suggesting right atrial pressure of 3 mmHg.  ? 7. Possible small PFO with mild left to right shunt.  ?- Comparison LVEF: 60-65% 04/06/21; 50-55% with diffuse LV hypokinesis 05/07/18; 60-65% 04/19/12; LVEF 17% with LV global hypokinesis 10/02/02) ? ? ?Long term event monitor ZioXT 02/23/22-03/01/22: ?ZIO XT reviewed.  5 days, 9 hours analyzed. - Predominant rhythm is sinus with heart rate ranging from 48 bpm up to 109 bpm and average heart rate 60 bpm. ?- There were rare PACs including atrial couplets and triplets representing less than 1% total beats.  There were rare PVCs including ventricular couplets representing less than 1% total beats.  Ventricular bigeminy was also noted. ?- Episodes of SVT were noted, more specifically atrial flutter with variable conduction was noted approximately 17% of the time.  Average heart rate was in the 70s.  Longest episode  lasted for 22 hours and 33 minutes. ?- There were no pauses. ? ? ?Nuclear stress test 12/11/12: ?Overall Impression:   ?Low risk stress nuclear study without evidence for ischemia. ?LV Wall Motion:  Mild global LV dysfunction with EF 42%. ?  ? ?US Carotid 06/06/19: ?IMPRESSION: ?- No significant stenosis in either internal carotid artery. ?- Vertebral arteries are patent with normal antegrade flow. ? ? ?RHC/LHC 10/03/02: ?FINDINGS: ? 1. Hemodynamics:  RA 19/15/13, RV 46/10/16, PA 46/20/28, PCW 27/26/20, LV ?    153/15/28, cardiac output 6.8, cardiac index 2.6, SBR 12/12. ? 2. No aortic stenosis on pullback. ? 3. Mitral regurgitation 1+. ? 4. Dilated left ventricle with global hypokinesis, ejection fraction 17%. ? 5. Left main:  Angiographically normal. ? 6. LAD:  The LAD is a large vessel giving rise to two large diagonal ?    branches.  The first diagonal has a 40% stenosis proximally. The distal ?    LAD has a 20% stenosis. ? 7. Circumflex:  Large vessel giving rise to two obtuse marginal branches. ?    It is angiographically normal. ? 8. RCA:  Moderate sized vessel.  There is a 30% stenosis of the mid vessel. ?  ?IMPRESSION/PLAN:  The patient has a nonischemic cardiomyopathy with severely ? impaired LV systolic function.  Left ventricular pressures as well as right ?  atrial pressures remain markedly elevated with preserved cardiac output. ? Blood pressure remains hypertensive. Will increase ACE inhibitor and ? continue aggressive diuresis. In addition, we will begin systemic ? anticoagulation with Coumadin. ? ? ?Past Medical History:  ?Diagnosis Date  ? Chronic diarrhea   ? CKD (chronic kidney disease) stage 3, GFR 30-59 ml/min (HCC)   ? COPD (chronic obstructive pulmonary disease) ([place])   ? Dysrhythmia 02/2022  ? paroxsymal SVT & aflutter 02/2022 XioXT  ? Essential hypertension   ? Gallstones   ? Gout   ? History of CHF (congestive heart failure)   ? History of colonic polyps   ? History of DVT (deep vein  thrombosis) 04/19/2012  ? BLE DVT 04/09/12  ? Kidney stones   ? Lumbar disc disease   ? Neuropathy   ? Nonischemic cardiomyopathy ([place]) 2003  ? Minor coronary atherosclerosis at cardiac catheterization 2003, LVEF ini

## 2022-05-03 NOTE — Anesthesia Preprocedure Evaluation (Addendum)
Anesthesia Evaluation  ?Patient identified by MRN, date of birth, ID band ?Patient awake ? ? ? ?Reviewed: ?Allergy & Precautions, NPO status , Patient's Chart, lab work & pertinent test results ? ?Airway ?Mallampati: II ? ?TM Distance: >3 FB ?Neck ROM: Full ? ? ? Dental ? ?(+) Poor Dentition, Chipped ?  ?Pulmonary ?neg pulmonary ROS, sleep apnea , COPD, former smoker,  ?  ?Pulmonary exam normal ?breath sounds clear to auscultation ? ? ? ? ? ? Cardiovascular ?hypertension, Pt. on medications and Pt. on home beta blockers ?+ DVT  ?Normal cardiovascular exam+ dysrhythmias Atrial Fibrillation  ?Rhythm:Regular Rate:Normal ? ?02/2022 ECHO: EF 60-65%, mild LVH, normal RVF, no significant valvular abnormalities ?  ?Neuro/Psych ?Traumatic SDH/SAH 01/2022 ?Now with chronic SDH ?negative neurological ROS ? negative psych ROS  ? GI/Hepatic ?negative GI ROS, Neg liver ROS, GERD  ,  ?Endo/Other  ?negative endocrine ROSdiabetes, Oral Hypoglycemic AgentsMorbid obesity ? Renal/GU ?Renal InsufficiencyRenal diseasenegative Renal ROS  ?negative genitourinary ?  ?Musculoskeletal ?negative musculoskeletal ROS ?(+) Arthritis , Rheumatoid disorders,   ? Abdominal ?  ?Peds ?negative pediatric ROS ?(+)  Hematology ?negative hematology ROS ?(+)   ?Anesthesia Other Findings ? ? Reproductive/Obstetrics ?negative OB ROS ? ?  ? ? ? ? ? ? ? ? ? ? ? ? ? ?  ?  ? ? ? ? ? ? ?Anesthesia Physical ?Anesthesia Plan ? ?ASA: 3 ? ?Anesthesia Plan: General  ? ?Post-op Pain Management: Tylenol PO (pre-op)*  ? ?Induction: Intravenous ? ?PONV Risk Score and Plan: 2 and Ondansetron and Dexamethasone ? ?Airway Management Planned: Oral ETT ? ?Additional Equipment: None ? ?Intra-op Plan:  ? ?Post-operative Plan: Extubation in OR ? ?Informed Consent: I have reviewed the patients History and Physical, chart, labs and discussed the procedure including the risks, benefits and alternatives for the proposed anesthesia with the patient or  authorized representative who has indicated his/her understanding and acceptance.  ? ? ? ?Dental advisory given ? ?Plan Discussed with: CRNA and Surgeon ? ?Anesthesia Plan Comments: (PAT note written 05/03/2022 by Shonna Chock, PA-C. ?)  ? ? ? ? ?Anesthesia Quick Evaluation ? ?

## 2022-05-04 ENCOUNTER — Other Ambulatory Visit: Payer: Self-pay

## 2022-05-04 ENCOUNTER — Inpatient Hospital Stay (HOSPITAL_COMMUNITY): Payer: Medicare Other | Admitting: Vascular Surgery

## 2022-05-04 ENCOUNTER — Encounter (HOSPITAL_COMMUNITY): Payer: Self-pay

## 2022-05-04 ENCOUNTER — Other Ambulatory Visit: Payer: Self-pay | Admitting: Neurosurgery

## 2022-05-04 ENCOUNTER — Inpatient Hospital Stay (HOSPITAL_COMMUNITY)
Admission: RE | Admit: 2022-05-04 | Discharge: 2022-05-06 | DRG: 026 | Disposition: A | Discharge status: Home / Self Care | Payer: Medicare Other | Attending: Neurosurgery | Admitting: Neurosurgery

## 2022-05-04 ENCOUNTER — Encounter (HOSPITAL_COMMUNITY)
Admission: RE | Disposition: A | Discharge status: Home / Self Care | Payer: Self-pay | Source: Home / Self Care | Attending: Neurosurgery

## 2022-05-04 DIAGNOSIS — I130 Hypertensive heart and chronic kidney disease with heart failure and stage 1 through stage 4 chronic kidney disease, or unspecified chronic kidney disease: Secondary | ICD-10-CM | POA: Diagnosis present

## 2022-05-04 DIAGNOSIS — J449 Chronic obstructive pulmonary disease, unspecified: Secondary | ICD-10-CM | POA: Diagnosis not present

## 2022-05-04 DIAGNOSIS — I428 Other cardiomyopathies: Secondary | ICD-10-CM | POA: Diagnosis present

## 2022-05-04 DIAGNOSIS — I5032 Chronic diastolic (congestive) heart failure: Secondary | ICD-10-CM | POA: Diagnosis not present

## 2022-05-04 DIAGNOSIS — Z881 Allergy status to other antibiotic agents status: Secondary | ICD-10-CM

## 2022-05-04 DIAGNOSIS — Z87891 Personal history of nicotine dependence: Secondary | ICD-10-CM

## 2022-05-04 DIAGNOSIS — Z8249 Family history of ischemic heart disease and other diseases of the circulatory system: Secondary | ICD-10-CM

## 2022-05-04 DIAGNOSIS — E119 Type 2 diabetes mellitus without complications: Secondary | ICD-10-CM | POA: Diagnosis not present

## 2022-05-04 DIAGNOSIS — I10 Essential (primary) hypertension: Secondary | ICD-10-CM | POA: Diagnosis not present

## 2022-05-04 DIAGNOSIS — S065XAA Traumatic subdural hemorrhage with loss of consciousness status unknown, initial encounter: Secondary | ICD-10-CM

## 2022-05-04 DIAGNOSIS — G4733 Obstructive sleep apnea (adult) (pediatric): Secondary | ICD-10-CM | POA: Diagnosis present

## 2022-05-04 DIAGNOSIS — M109 Gout, unspecified: Secondary | ICD-10-CM | POA: Diagnosis present

## 2022-05-04 DIAGNOSIS — Z9884 Bariatric surgery status: Secondary | ICD-10-CM

## 2022-05-04 DIAGNOSIS — W07XXXA Fall from chair, initial encounter: Secondary | ICD-10-CM | POA: Diagnosis present

## 2022-05-04 DIAGNOSIS — I493 Ventricular premature depolarization: Secondary | ICD-10-CM | POA: Diagnosis present

## 2022-05-04 DIAGNOSIS — R402140 Coma scale, eyes open, spontaneous, unspecified time: Secondary | ICD-10-CM | POA: Diagnosis present

## 2022-05-04 DIAGNOSIS — Z6839 Body mass index (BMI) 39.0-39.9, adult: Secondary | ICD-10-CM

## 2022-05-04 DIAGNOSIS — Z888 Allergy status to other drugs, medicaments and biological substances status: Secondary | ICD-10-CM | POA: Diagnosis not present

## 2022-05-04 DIAGNOSIS — Z7984 Long term (current) use of oral hypoglycemic drugs: Secondary | ICD-10-CM | POA: Diagnosis not present

## 2022-05-04 DIAGNOSIS — M069 Rheumatoid arthritis, unspecified: Secondary | ICD-10-CM | POA: Diagnosis present

## 2022-05-04 DIAGNOSIS — I4892 Unspecified atrial flutter: Secondary | ICD-10-CM | POA: Diagnosis not present

## 2022-05-04 DIAGNOSIS — I4891 Unspecified atrial fibrillation: Secondary | ICD-10-CM | POA: Diagnosis not present

## 2022-05-04 DIAGNOSIS — Z86711 Personal history of pulmonary embolism: Secondary | ICD-10-CM | POA: Diagnosis not present

## 2022-05-04 DIAGNOSIS — E1140 Type 2 diabetes mellitus with diabetic neuropathy, unspecified: Secondary | ICD-10-CM | POA: Diagnosis present

## 2022-05-04 DIAGNOSIS — N183 Chronic kidney disease, stage 3 unspecified: Secondary | ICD-10-CM | POA: Diagnosis not present

## 2022-05-04 DIAGNOSIS — Z791 Long term (current) use of non-steroidal anti-inflammatories (NSAID): Secondary | ICD-10-CM

## 2022-05-04 DIAGNOSIS — Z86718 Personal history of other venous thrombosis and embolism: Secondary | ICD-10-CM

## 2022-05-04 DIAGNOSIS — Z79899 Other long term (current) drug therapy: Secondary | ICD-10-CM

## 2022-05-04 DIAGNOSIS — E1122 Type 2 diabetes mellitus with diabetic chronic kidney disease: Secondary | ICD-10-CM | POA: Diagnosis not present

## 2022-05-04 DIAGNOSIS — Z833 Family history of diabetes mellitus: Secondary | ICD-10-CM

## 2022-05-04 DIAGNOSIS — I6200 Nontraumatic subdural hemorrhage, unspecified: Secondary | ICD-10-CM | POA: Diagnosis not present

## 2022-05-04 DIAGNOSIS — I471 Supraventricular tachycardia: Secondary | ICD-10-CM | POA: Diagnosis not present

## 2022-05-04 DIAGNOSIS — R402250 Coma scale, best verbal response, oriented, unspecified time: Secondary | ICD-10-CM | POA: Diagnosis present

## 2022-05-04 DIAGNOSIS — S065XAD Traumatic subdural hemorrhage with loss of consciousness status unknown, subsequent encounter: Principal | ICD-10-CM

## 2022-05-04 DIAGNOSIS — R402360 Coma scale, best motor response, obeys commands, unspecified time: Secondary | ICD-10-CM | POA: Diagnosis present

## 2022-05-04 HISTORY — PX: BURR HOLE: SHX908

## 2022-05-04 HISTORY — DX: Personal history of other diseases of the circulatory system: Z86.79

## 2022-05-04 LAB — GLUCOSE, CAPILLARY
Glucose-Capillary: 170 mg/dL — ABNORMAL HIGH (ref 70–99)
Glucose-Capillary: 162 mg/dL — ABNORMAL HIGH (ref 70–99)
Glucose-Capillary: 151 mg/dL — ABNORMAL HIGH (ref 70–99)

## 2022-05-04 LAB — CBC
HCT: 39.1 % (ref 39.0–52.0)
Hemoglobin: 13.3 g/dL (ref 13.0–17.0)
MCH: 33.9 pg (ref 26.0–34.0)
MCHC: 34 g/dL (ref 30.0–36.0)
MCV: 99.7 fL (ref 80.0–100.0)
Platelets: 87 10*3/uL — ABNORMAL LOW (ref 150–400)
RBC: 3.92 MIL/uL — ABNORMAL LOW (ref 4.22–5.81)
RDW: 13.7 % (ref 11.5–15.5)
WBC: 8.6 10*3/uL (ref 4.0–10.5)
nRBC: 0 % (ref 0.0–0.2)

## 2022-05-04 LAB — SURGICAL PCR SCREEN
MRSA, PCR: POSITIVE — AB
Staphylococcus aureus: POSITIVE — AB

## 2022-05-04 SURGERY — CREATION, CRANIAL BURR HOLE
Anesthesia: General | Laterality: Right

## 2022-05-04 MED ORDER — AMLODIPINE BESYLATE 5 MG PO TABS
5.0000 mg | ORAL_TABLET | Freq: Every day | ORAL | Status: DC
  Administered 2022-05-05 – 2022-05-06 (×2): 5 mg via ORAL
  Filled 2022-05-04 (×2): qty 1

## 2022-05-04 MED ORDER — PHENYLEPHRINE HCL-NACL 20-0.9 MG/250ML-% IV SOLN
INTRAVENOUS | Status: DC | PRN
  Administered 2022-05-04: 50 ug/min via INTRAVENOUS
  Administered 2022-05-04: 40 ug/min via INTRAVENOUS

## 2022-05-04 MED ORDER — PROPOFOL 10 MG/ML IV BOLUS
INTRAVENOUS | Status: AC
  Filled 2022-05-04: qty 20

## 2022-05-04 MED ORDER — MUPIROCIN 2 % EX OINT
1.0000 "application " | TOPICAL_OINTMENT | Freq: Two times a day (BID) | CUTANEOUS | Status: DC
  Administered 2022-05-04 – 2022-05-06 (×4): 1 via NASAL
  Filled 2022-05-04: qty 22

## 2022-05-04 MED ORDER — LACTATED RINGERS IV SOLN
INTRAVENOUS | Status: DC | PRN
Start: 2022-05-04 — End: 2022-05-04

## 2022-05-04 MED ORDER — ACETAMINOPHEN 500 MG PO TABS
1000.0000 mg | ORAL_TABLET | Freq: Once | ORAL | Status: AC
  Administered 2022-05-04: 500 mg via ORAL
  Filled 2022-05-04: qty 2

## 2022-05-04 MED ORDER — ORAL CARE MOUTH RINSE
15.0000 mL | Freq: Once | OROMUCOSAL | Status: AC
  Administered 2022-05-04: 15 mL via OROMUCOSAL

## 2022-05-04 MED ORDER — ALLOPURINOL 300 MG PO TABS
300.0000 mg | ORAL_TABLET | Freq: Every morning | ORAL | Status: DC
  Administered 2022-05-05 – 2022-05-06 (×2): 300 mg via ORAL
  Filled 2022-05-04 (×2): qty 1

## 2022-05-04 MED ORDER — HYDROXYCHLOROQUINE SULFATE 200 MG PO TABS
200.0000 mg | ORAL_TABLET | Freq: Two times a day (BID) | ORAL | Status: DC
  Administered 2022-05-04 – 2022-05-06 (×4): 200 mg via ORAL
  Filled 2022-05-04 (×6): qty 1

## 2022-05-04 MED ORDER — ROCURONIUM BROMIDE 10 MG/ML (PF) SYRINGE
PREFILLED_SYRINGE | INTRAVENOUS | Status: DC | PRN
  Administered 2022-05-04 (×2): 50 mg via INTRAVENOUS

## 2022-05-04 MED ORDER — MENTHOL 3 MG MT LOZG
1.0000 | LOZENGE | OROMUCOSAL | Status: DC | PRN
Start: 2022-05-04 — End: 2022-05-06
  Administered 2022-05-04 – 2022-05-05 (×2): 3 mg via ORAL
  Filled 2022-05-04: qty 9

## 2022-05-04 MED ORDER — POTASSIUM CHLORIDE IN NACL 20-0.9 MEQ/L-% IV SOLN
INTRAVENOUS | Status: DC
  Filled 2022-05-04 (×2): qty 1000

## 2022-05-04 MED ORDER — MIDAZOLAM HCL 2 MG/2ML IJ SOLN: 0.5000 mg | Freq: Once | INTRAMUSCULAR | Status: DC | PRN

## 2022-05-04 MED ORDER — PROPOFOL 10 MG/ML IV BOLUS
INTRAVENOUS | Status: DC | PRN
  Administered 2022-05-04: 50 mg via INTRAVENOUS
  Administered 2022-05-04: 70 mg via INTRAVENOUS
  Administered 2022-05-04: 40 mg via INTRAVENOUS
  Administered 2022-05-04: 200 mg via INTRAVENOUS

## 2022-05-04 MED ORDER — CALCIUM CARBONATE 1250 (500 CA) MG PO TABS
500.0000 mg | ORAL_TABLET | Freq: Two times a day (BID) | ORAL | Status: DC
  Administered 2022-05-04 – 2022-05-06 (×4): 500 mg via ORAL
  Filled 2022-05-04 (×6): qty 1

## 2022-05-04 MED ORDER — FENTANYL CITRATE (PF) 250 MCG/5ML IJ SOLN
INTRAMUSCULAR | Status: DC | PRN
  Administered 2022-05-04: 50 ug via INTRAVENOUS
  Administered 2022-05-04: 100 ug via INTRAVENOUS

## 2022-05-04 MED ORDER — CEFAZOLIN IN SODIUM CHLORIDE 3-0.9 GM/100ML-% IV SOLN
INTRAVENOUS | Status: AC
  Filled 2022-05-04: qty 100

## 2022-05-04 MED ORDER — CEFAZOLIN SODIUM-DEXTROSE 2-4 GM/100ML-% IV SOLN
INTRAVENOUS | Status: AC
  Filled 2022-05-04: qty 100

## 2022-05-04 MED ORDER — ROCURONIUM BROMIDE 10 MG/ML (PF) SYRINGE
PREFILLED_SYRINGE | INTRAVENOUS | Status: AC
  Filled 2022-05-04: qty 10

## 2022-05-04 MED ORDER — INSULIN ASPART 100 UNIT/ML IJ SOLN
0.0000 [IU] | INTRAMUSCULAR | Status: DC | PRN
  Administered 2022-05-04: 2 [IU] via SUBCUTANEOUS
  Filled 2022-05-04: qty 1

## 2022-05-04 MED ORDER — ENALAPRILAT 1.25 MG/ML IV SOLN
1.2500 mg | Freq: Four times a day (QID) | INTRAVENOUS | Status: DC | PRN
  Administered 2022-05-06 (×2): 1.25 mg via INTRAVENOUS
  Filled 2022-05-04 (×2): qty 1

## 2022-05-04 MED ORDER — THROMBIN 5000 UNITS EX SOLR
OROMUCOSAL | Status: DC | PRN
  Administered 2022-05-04: 5 mL via TOPICAL

## 2022-05-04 MED ORDER — EPHEDRINE SULFATE-NACL 50-0.9 MG/10ML-% IV SOSY
PREFILLED_SYRINGE | INTRAVENOUS | Status: DC | PRN
  Administered 2022-05-04 (×2): 10 mg via INTRAVENOUS
  Administered 2022-05-04: 20 mg via INTRAVENOUS

## 2022-05-04 MED ORDER — GELATIN ABSORBABLE 100 EX MISC
CUTANEOUS | Status: DC | PRN
  Administered 2022-05-04: 20 mL via TOPICAL

## 2022-05-04 MED ORDER — LABETALOL HCL 5 MG/ML IV SOLN: 10.0000 mg | INTRAVENOUS | Status: DC | PRN

## 2022-05-04 MED ORDER — OXYCODONE HCL 5 MG PO TABS: 5.0000 mg | ORAL_TABLET | Freq: Once | ORAL | Status: DC | PRN

## 2022-05-04 MED ORDER — OXYCODONE HCL 5 MG/5ML PO SOLN: 5.0000 mg | Freq: Once | ORAL | Status: DC | PRN

## 2022-05-04 MED ORDER — LEVETIRACETAM 500 MG PO TABS
500.0000 mg | ORAL_TABLET | Freq: Two times a day (BID) | ORAL | Status: DC
Start: 2022-05-04 — End: 2022-05-06
  Administered 2022-05-04 – 2022-05-06 (×4): 500 mg via ORAL
  Filled 2022-05-04 (×4): qty 1

## 2022-05-04 MED ORDER — FUROSEMIDE 40 MG PO TABS
40.0000 mg | ORAL_TABLET | Freq: Every morning | ORAL | Status: DC
  Administered 2022-05-05 – 2022-05-06 (×2): 40 mg via ORAL
  Filled 2022-05-04 (×2): qty 1

## 2022-05-04 MED ORDER — MEPERIDINE HCL 25 MG/ML IJ SOLN: 6.2500 mg | INTRAMUSCULAR | Status: DC | PRN

## 2022-05-04 MED ORDER — NICARDIPINE HCL IN NACL 20-0.86 MG/200ML-% IV SOLN
3.0000 mg/h | INTRAVENOUS | Status: DC
  Administered 2022-05-04 (×2): 5 mg/h via INTRAVENOUS
  Administered 2022-05-05: 7.5 mg/h via INTRAVENOUS
  Administered 2022-05-05: 5 mg/h via INTRAVENOUS
  Administered 2022-05-05: 7.5 mg/h via INTRAVENOUS
  Administered 2022-05-06 (×2): 5 mg/h via INTRAVENOUS
  Filled 2022-05-04 (×7): qty 200

## 2022-05-04 MED ORDER — SUCCINYLCHOLINE CHLORIDE 200 MG/10ML IV SOSY
PREFILLED_SYRINGE | INTRAVENOUS | Status: DC | PRN
  Administered 2022-05-04: 200 mg via INTRAVENOUS

## 2022-05-04 MED ORDER — ADULT MULTIVITAMIN W/MINERALS CH
1.0000 | ORAL_TABLET | Freq: Every day | ORAL | Status: DC
  Administered 2022-05-04 – 2022-05-06 (×3): 1 via ORAL
  Filled 2022-05-04 (×3): qty 1

## 2022-05-04 MED ORDER — ONDANSETRON HCL 4 MG/2ML IJ SOLN
INTRAMUSCULAR | Status: DC | PRN
  Administered 2022-05-04: 4 mg via INTRAVENOUS

## 2022-05-04 MED ORDER — ACETAMINOPHEN 650 MG RE SUPP
650.0000 mg | RECTAL | Status: DC | PRN
Start: 2022-05-04 — End: 2022-05-06

## 2022-05-04 MED ORDER — HYDROCODONE-ACETAMINOPHEN 5-325 MG PO TABS
1.0000 | ORAL_TABLET | ORAL | Status: DC | PRN
  Administered 2022-05-04 – 2022-05-06 (×7): 1 via ORAL
  Filled 2022-05-04 (×7): qty 1

## 2022-05-04 MED ORDER — ONDANSETRON HCL 4 MG PO TABS: 4.0000 mg | ORAL_TABLET | ORAL | Status: DC | PRN

## 2022-05-04 MED ORDER — ATORVASTATIN CALCIUM 80 MG PO TABS
80.0000 mg | ORAL_TABLET | Freq: Every day | ORAL | Status: DC
  Administered 2022-05-04 – 2022-05-06 (×3): 80 mg via ORAL
  Filled 2022-05-04 (×3): qty 1

## 2022-05-04 MED ORDER — LIDOCAINE 2% (20 MG/ML) 5 ML SYRINGE
INTRAMUSCULAR | Status: AC
  Filled 2022-05-04: qty 5

## 2022-05-04 MED ORDER — SENNA 8.6 MG PO TABS
1.0000 | ORAL_TABLET | Freq: Two times a day (BID) | ORAL | Status: DC
  Administered 2022-05-04 – 2022-05-05 (×2): 8.6 mg via ORAL
  Filled 2022-05-04 (×3): qty 1

## 2022-05-04 MED ORDER — FENTANYL CITRATE (PF) 100 MCG/2ML IJ SOLN: 25.0000 ug | INTRAMUSCULAR | Status: DC | PRN

## 2022-05-04 MED ORDER — LIDOCAINE-EPINEPHRINE 1 %-1:100000 IJ SOLN
INTRAMUSCULAR | Status: AC
  Filled 2022-05-04: qty 1

## 2022-05-04 MED ORDER — CEFAZOLIN SODIUM-DEXTROSE 1-4 GM/50ML-% IV SOLN
1.0000 g | Freq: Three times a day (TID) | INTRAVENOUS | Status: AC
  Administered 2022-05-05 (×3): 1 g via INTRAVENOUS
  Filled 2022-05-04 (×4): qty 50

## 2022-05-04 MED ORDER — SODIUM CHLORIDE 0.9 % IV SOLN: INTRAVENOUS | Status: DC

## 2022-05-04 MED ORDER — LIDOCAINE-EPINEPHRINE 1 %-1:100000 IJ SOLN
INTRAMUSCULAR | Status: DC | PRN
  Administered 2022-05-04: 8 mL

## 2022-05-04 MED ORDER — MIDAZOLAM HCL 2 MG/2ML IJ SOLN
INTRAMUSCULAR | Status: AC
  Filled 2022-05-04: qty 2

## 2022-05-04 MED ORDER — CEFAZOLIN IN SODIUM CHLORIDE 3-0.9 GM/100ML-% IV SOLN
2.0000 g | INTRAVENOUS | Status: AC
  Administered 2022-05-04: 3 g via INTRAVENOUS

## 2022-05-04 MED ORDER — FENTANYL CITRATE (PF) 250 MCG/5ML IJ SOLN
INTRAMUSCULAR | Status: AC
  Filled 2022-05-04: qty 5

## 2022-05-04 MED ORDER — ONDANSETRON HCL 4 MG/2ML IJ SOLN
INTRAMUSCULAR | Status: AC
  Filled 2022-05-04: qty 2

## 2022-05-04 MED ORDER — THROMBIN 5000 UNITS EX SOLR
CUTANEOUS | Status: AC
  Filled 2022-05-04: qty 5000

## 2022-05-04 MED ORDER — CHLORHEXIDINE GLUCONATE CLOTH 2 % EX PADS: 6.0000 | MEDICATED_PAD | Freq: Once | CUTANEOUS | Status: DC

## 2022-05-04 MED ORDER — FLEET ENEMA 7-19 GM/118ML RE ENEM: 1.0000 | ENEMA | Freq: Once | RECTAL | Status: DC | PRN

## 2022-05-04 MED ORDER — LIDOCAINE 2% (20 MG/ML) 5 ML SYRINGE
INTRAMUSCULAR | Status: DC | PRN
  Administered 2022-05-04: 100 mg via INTRAVENOUS

## 2022-05-04 MED ORDER — 0.9 % SODIUM CHLORIDE (POUR BTL) OPTIME
TOPICAL | Status: DC | PRN
  Administered 2022-05-04 (×3): 1000 mL

## 2022-05-04 MED ORDER — DULOXETINE HCL 30 MG PO CPEP
30.0000 mg | ORAL_CAPSULE | Freq: Two times a day (BID) | ORAL | Status: DC
  Administered 2022-05-04 – 2022-05-06 (×4): 30 mg via ORAL
  Filled 2022-05-04 (×6): qty 1

## 2022-05-04 MED ORDER — CHLORHEXIDINE GLUCONATE CLOTH 2 % EX PADS: 6.0000 | MEDICATED_PAD | Freq: Every day | CUTANEOUS | Status: DC

## 2022-05-04 MED ORDER — SODIUM CHLORIDE 0.9 % IV SOLN: INTRAVENOUS | Status: DC | PRN

## 2022-05-04 MED ORDER — METFORMIN HCL ER 500 MG PO TB24
500.0000 mg | ORAL_TABLET | Freq: Every day | ORAL | Status: DC
  Administered 2022-05-05 – 2022-05-06 (×2): 500 mg via ORAL
  Filled 2022-05-04 (×3): qty 1

## 2022-05-04 MED ORDER — KETAMINE HCL 50 MG/5ML IJ SOSY
PREFILLED_SYRINGE | INTRAMUSCULAR | Status: AC
  Filled 2022-05-04: qty 5

## 2022-05-04 MED ORDER — POTASSIUM CHLORIDE CRYS ER 20 MEQ PO TBCR
20.0000 meq | EXTENDED_RELEASE_TABLET | Freq: Every day | ORAL | Status: DC
  Administered 2022-05-04 – 2022-05-06 (×3): 20 meq via ORAL
  Filled 2022-05-04 (×3): qty 1

## 2022-05-04 MED ORDER — COLCHICINE 0.6 MG PO TABS
0.6000 mg | ORAL_TABLET | Freq: Every day | ORAL | Status: DC | PRN
  Administered 2022-05-04: 0.6 mg via ORAL
  Filled 2022-05-04 (×2): qty 1

## 2022-05-04 MED ORDER — ONDANSETRON 4 MG PO TBDP: 4.0000 mg | ORAL_TABLET | Freq: Four times a day (QID) | ORAL | Status: DC | PRN

## 2022-05-04 MED ORDER — CARVEDILOL 3.125 MG PO TABS
6.2500 mg | ORAL_TABLET | Freq: Every evening | ORAL | Status: DC
Start: 2022-05-04 — End: 2022-05-06
  Administered 2022-05-04 – 2022-05-05 (×2): 6.25 mg via ORAL
  Filled 2022-05-04 (×2): qty 2

## 2022-05-04 MED ORDER — LISINOPRIL 20 MG PO TABS
20.0000 mg | ORAL_TABLET | Freq: Every day | ORAL | Status: DC
  Administered 2022-05-04 – 2022-05-06 (×3): 20 mg via ORAL
  Filled 2022-05-04 (×3): qty 1

## 2022-05-04 MED ORDER — MORPHINE SULFATE (PF) 2 MG/ML IV SOLN: 1.0000 mg | INTRAVENOUS | Status: DC | PRN

## 2022-05-04 MED ORDER — ONDANSETRON HCL 4 MG/2ML IJ SOLN: 4.0000 mg | INTRAMUSCULAR | Status: DC | PRN

## 2022-05-04 MED ORDER — THROMBIN 20000 UNITS EX SOLR
CUTANEOUS | Status: AC
  Filled 2022-05-04: qty 20000

## 2022-05-04 MED ORDER — CALCITRIOL 0.25 MCG PO CAPS
0.2500 ug | ORAL_CAPSULE | Freq: Every day | ORAL | Status: DC
  Administered 2022-05-05 – 2022-05-06 (×2): 0.25 ug via ORAL
  Filled 2022-05-04 (×3): qty 1

## 2022-05-04 MED ORDER — DEXAMETHASONE SODIUM PHOSPHATE 10 MG/ML IJ SOLN
INTRAMUSCULAR | Status: AC
  Filled 2022-05-04: qty 1

## 2022-05-04 MED ORDER — CHLORHEXIDINE GLUCONATE 0.12 % MT SOLN: 15.0000 mL | Freq: Once | OROMUCOSAL | Status: AC

## 2022-05-04 MED ORDER — EPHEDRINE 5 MG/ML INJ
INTRAVENOUS | Status: AC
  Filled 2022-05-04: qty 5

## 2022-05-04 MED ORDER — PROMETHAZINE HCL 25 MG PO TABS: 12.5000 mg | ORAL_TABLET | ORAL | Status: DC | PRN

## 2022-05-04 MED ORDER — TAMSULOSIN HCL 0.4 MG PO CAPS
0.4000 mg | ORAL_CAPSULE | Freq: Every day | ORAL | Status: DC
  Administered 2022-05-04 – 2022-05-05 (×2): 0.4 mg via ORAL
  Filled 2022-05-04 (×2): qty 1

## 2022-05-04 MED ORDER — PANTOPRAZOLE SODIUM 40 MG PO TBEC: 40.0000 mg | DELAYED_RELEASE_TABLET | Freq: Every day | ORAL | Status: DC

## 2022-05-04 MED ORDER — ACETAMINOPHEN 500 MG PO TABS: 500.0000 mg | ORAL_TABLET | Freq: Four times a day (QID) | ORAL | Status: DC | PRN

## 2022-05-04 MED ORDER — PANTOPRAZOLE SODIUM 40 MG PO TBEC
40.0000 mg | DELAYED_RELEASE_TABLET | Freq: Every day | ORAL | Status: DC
Start: 2022-05-04 — End: 2022-05-06
  Administered 2022-05-04 – 2022-05-06 (×3): 40 mg via ORAL
  Filled 2022-05-04 (×3): qty 1

## 2022-05-04 MED ORDER — FERROUS SULFATE 325 (65 FE) MG PO TABS
325.0000 mg | ORAL_TABLET | Freq: Every day | ORAL | Status: DC
  Administered 2022-05-05 – 2022-05-06 (×2): 325 mg via ORAL
  Filled 2022-05-04 (×2): qty 1

## 2022-05-04 MED ORDER — DEXAMETHASONE SODIUM PHOSPHATE 10 MG/ML IJ SOLN
INTRAMUSCULAR | Status: DC | PRN
  Administered 2022-05-04: 5 mg via INTRAVENOUS

## 2022-05-04 MED ORDER — CYANOCOBALAMIN 500 MCG PO TABS
500.0000 ug | ORAL_TABLET | Freq: Every day | ORAL | Status: DC
  Administered 2022-05-04 – 2022-05-06 (×3): 500 ug via ORAL
  Filled 2022-05-04 (×3): qty 1

## 2022-05-04 MED ORDER — CARVEDILOL 3.125 MG PO TABS
3.1250 mg | ORAL_TABLET | ORAL | Status: DC
  Administered 2022-05-05 – 2022-05-06 (×2): 3.125 mg via ORAL
  Filled 2022-05-04 (×2): qty 1

## 2022-05-04 MED ORDER — ACETAMINOPHEN 325 MG PO TABS
650.0000 mg | ORAL_TABLET | ORAL | Status: DC | PRN
Start: 2022-05-04 — End: 2022-05-06

## 2022-05-04 SURGICAL SUPPLY — 78 items
BAG COUNTER SPONGE SURGICOUNT (BAG) ×2 IMPLANT
BENZOIN TINCTURE PRP APPL 2/3 (GAUZE/BANDAGES/DRESSINGS) ×2 IMPLANT
BLADE CLIPPER SURG (BLADE) ×2 IMPLANT
BNDG GAUZE ELAST 4 BULKY (GAUZE/BANDAGES/DRESSINGS) IMPLANT
BUR ACORN 9.0 PRECISION (BURR) IMPLANT
BUR SPIRAL ROUTER 2.3 (BUR) ×2 IMPLANT
CANISTER SUCT 3000ML PPV (MISCELLANEOUS) ×2 IMPLANT
CARTRIDGE OIL MAESTRO DRILL (MISCELLANEOUS) ×1 IMPLANT
CATH VENTRIC 35X38 W/TROCAR LG (CATHETERS) ×1 IMPLANT
CLIP VESOCCLUDE MED 6/CT (CLIP) IMPLANT
DERMABOND ADVANCED (GAUZE/BANDAGES/DRESSINGS) ×1
DERMABOND ADVANCED .7 DNX12 (GAUZE/BANDAGES/DRESSINGS) ×1 IMPLANT
DIFFUSER DRILL AIR PNEUMATIC (MISCELLANEOUS) ×2 IMPLANT
DRAPE MICROSCOPE LEICA (MISCELLANEOUS) IMPLANT
DRAPE NEUROLOGICAL W/INCISE (DRAPES) ×2 IMPLANT
DRAPE SHEET LG 3/4 BI-LAMINATE (DRAPES) ×2 IMPLANT
DRAPE WARM FLUID 44X44 (DRAPES) ×2 IMPLANT
DRSG AQUACEL AG ADV 3.5X 4 (GAUZE/BANDAGES/DRESSINGS) ×2 IMPLANT
DRSG AQUACEL AG ADV 3.5X 6 (GAUZE/BANDAGES/DRESSINGS) ×2 IMPLANT
DRSG MEPILEX BORDER 4X4 (GAUZE/BANDAGES/DRESSINGS) IMPLANT
DRSG XEROFORM 1X8 (GAUZE/BANDAGES/DRESSINGS) IMPLANT
DURAPREP 26ML APPLICATOR (WOUND CARE) ×2 IMPLANT
ELECT COATED BLADE 2.86 ST (ELECTRODE) ×2 IMPLANT
ELECT REM PT RETURN 9FT ADLT (ELECTROSURGICAL) ×2
ELECTRODE REM PT RTRN 9FT ADLT (ELECTROSURGICAL) ×1 IMPLANT
EVACUATOR 1/8 PVC DRAIN (DRAIN) IMPLANT
EVACUATOR SILICONE 100CC (DRAIN) IMPLANT
FORCEPS BIPOLAR SPETZLER 8 1.0 (NEUROSURGERY SUPPLIES) ×2 IMPLANT
GAUZE 4X4 16PLY ~~LOC~~+RFID DBL (SPONGE) IMPLANT
GAUZE SPONGE 4X4 12PLY STRL (GAUZE/BANDAGES/DRESSINGS) ×2 IMPLANT
GLOVE BIOGEL PI IND STRL 7.5 (GLOVE) ×2 IMPLANT
GLOVE BIOGEL PI INDICATOR 7.5 (GLOVE) ×2
GLOVE ECLIPSE 7.5 STRL STRAW (GLOVE) ×2 IMPLANT
GLOVE SURG ENC MOIS LTX SZ8 (GLOVE) ×2 IMPLANT
GLOVE SURG UNDER POLY LF SZ8.5 (GLOVE) ×2 IMPLANT
GOWN STRL REUS W/ TWL LRG LVL3 (GOWN DISPOSABLE) ×2 IMPLANT
GOWN STRL REUS W/ TWL XL LVL3 (GOWN DISPOSABLE) ×1 IMPLANT
GOWN STRL REUS W/TWL 2XL LVL3 (GOWN DISPOSABLE) IMPLANT
GOWN STRL REUS W/TWL LRG LVL3 (GOWN DISPOSABLE) ×4
GOWN STRL REUS W/TWL XL LVL3 (GOWN DISPOSABLE) ×2
HEMOSTAT POWDER KIT SURGIFOAM (HEMOSTASIS) ×2 IMPLANT
HEMOSTAT SURGICEL 2X14 (HEMOSTASIS) ×2 IMPLANT
HOOK RETRACTION 12 ELAST STAY (MISCELLANEOUS) IMPLANT
KIT BASIN OR (CUSTOM PROCEDURE TRAY) ×2 IMPLANT
KIT TURNOVER KIT B (KITS) ×2 IMPLANT
NEEDLE HYPO 22GX1.5 SAFETY (NEEDLE) ×2 IMPLANT
NS IRRIG 1000ML POUR BTL (IV SOLUTION) ×6 IMPLANT
OIL CARTRIDGE MAESTRO DRILL (MISCELLANEOUS) ×2
PACK CRANIOTOMY CUSTOM (CUSTOM PROCEDURE TRAY) ×2 IMPLANT
PATTIES SURGICAL .5 X.5 (GAUZE/BANDAGES/DRESSINGS) IMPLANT
PATTIES SURGICAL .5 X3 (DISPOSABLE) IMPLANT
PATTIES SURGICAL 1X1 (DISPOSABLE) IMPLANT
PERFORATOR LRG  14-11MM (BIT) ×2
PERFORATOR LRG 14-11MM (BIT) ×1 IMPLANT
SEPRAFILM MEMBRANE 5X6 (MISCELLANEOUS) IMPLANT
SPONGE NEURO XRAY DETECT 1X3 (DISPOSABLE) IMPLANT
SPONGE SURGIFOAM ABS GEL 100 (HEMOSTASIS) ×2 IMPLANT
SPONGE T-LAP 18X18 ~~LOC~~+RFID (SPONGE) ×2 IMPLANT
STAPLER VISISTAT 35W (STAPLE) ×4 IMPLANT
STOCKINETTE 6  STRL (DRAPES) ×2
STOCKINETTE 6 STRL (DRAPES) ×1 IMPLANT
STRIP CLOSURE SKIN 1/2X4 (GAUZE/BANDAGES/DRESSINGS) ×2 IMPLANT
SUT ETHILON 3 0 FSL (SUTURE) IMPLANT
SUT ETHILON 3 0 PS 1 (SUTURE) IMPLANT
SUT MON AB 3-0 SH 27 (SUTURE)
SUT MON AB 3-0 SH27 (SUTURE) IMPLANT
SUT NURALON 4 0 TR CR/8 (SUTURE) IMPLANT
SUT VIC AB 2-0 CP2 18 (SUTURE) ×4 IMPLANT
SUT VIC AB 3-0 SH 8-18 (SUTURE) IMPLANT
SUT VICRYL RAPIDE 3 0 (SUTURE) IMPLANT
SYSTEM CSF EXTERNAL DRAINAGE (MISCELLANEOUS) ×1 IMPLANT
TAPE PAPER 1X10 WHT MICROPORE (GAUZE/BANDAGES/DRESSINGS) ×2 IMPLANT
TAPE PAPER 2X10 WHT MICROPORE (GAUZE/BANDAGES/DRESSINGS) ×2 IMPLANT
TOWEL GREEN STERILE (TOWEL DISPOSABLE) ×2 IMPLANT
TOWEL GREEN STERILE FF (TOWEL DISPOSABLE) ×2 IMPLANT
TRAY FOLEY MTR SLVR 16FR STAT (SET/KITS/TRAYS/PACK) ×2 IMPLANT
TUBE CONNECTING 20X1/4 (TUBING) ×2 IMPLANT
WATER STERILE IRR 1000ML POUR (IV SOLUTION) ×2 IMPLANT

## 2022-05-04 NOTE — Anesthesia Procedure Notes (Signed)
Procedure Name: Intubation ?Date/Time: 05/04/2022 2:08 PM ?Performed by: Maude Leriche, CRNA ?Pre-anesthesia Checklist: Patient identified, Emergency Drugs available, Suction available and Patient being monitored ?Patient Re-evaluated:Patient Re-evaluated prior to induction ?Oxygen Delivery Method: Circle system utilized ?Preoxygenation: Pre-oxygenation with 100% oxygen ?Induction Type: IV induction ?Ventilation: Mask ventilation without difficulty ?Laryngoscope Size: Glidescope and 4 ?Grade View: Grade I ?Tube type: Oral ?Tube size: 7.5 mm ?Number of attempts: 1 ?Airway Equipment and Method: Stylet and Bite block ?Placement Confirmation: ETT inserted through vocal cords under direct vision, positive ETCO2 and breath sounds checked- equal and bilateral ?Secured at: 23 cm ?Tube secured with: Tape ?Dental Injury: Teeth and Oropharynx as per pre-operative assessment  ? ? ? ? ?

## 2022-05-04 NOTE — Transfer of Care (Signed)
Immediate Anesthesia Transfer of Care Note ? ?Patient: Mitchell JARDON Sr. ? ?Procedure(s) Performed: BURR HOLES FOR Subdural Hematoma (Right) ? ?Patient Location: PACU ? ?Anesthesia Type:General ? ?Level of Consciousness: awake, alert  and drowsy ? ?Airway & Oxygen Therapy: Patient Spontanous Breathing ? ?Post-op Assessment: Report given to RN, Post -op Vital signs reviewed and stable and Patient moving all extremities X 4 ? ?Post vital signs: Reviewed and stable ? ?Last Vitals:  ?Vitals Value Taken Time  ?BP 166/39 05/04/22 1605  ?Temp 36.7 ?C 05/04/22 1550  ?Pulse 48 05/04/22 1615  ?Resp 14 05/04/22 1615  ?SpO2 97 % 05/04/22 1615  ?Vitals shown include unvalidated device data. ? ?Last Pain:  ?Vitals:  ? 05/04/22 1550  ?TempSrc:   ?PainSc: 0-No pain  ?   ? ?  ? ?Complications: No notable events documented. ?

## 2022-05-04 NOTE — H&P (Signed)
CC: SDH ? ?HPI: ? ?  ? ?Patient is a 71 y.o. male presents with subacute right subdural hematoma.  He suffered head trauma recently with acute SDH and developed some persistent occasional headaches and dizziness. ? ? ?Patient Active Problem List  ? Diagnosis Date Noted  ? SDH (subdural hematoma) ([place]) 02/03/2022  ? Right ear pain 07/13/2021  ? Venous insufficiency of right leg 08/19/2020  ? Thrombocytopenia ([place]) 09/08/2018  ? Chronic diastolic congestive heart failure ([place]) 08/17/2017  ? Hypokalemia 05/23/2012  ? Rheumatoid aortitis 05/23/2012  ? Pulmonary embolism ([place]) 04/19/2012  ? Chronic renal insufficiency 04/17/2012  ? Obesity, morbid ([place]) 01/16/2012  ? CHF (congestive heart failure) ([place]) 01/16/2012  ? COPD (chronic obstructive pulmonary disease) ([place]) 01/16/2012  ? OSA on CPAP 01/16/2012  ? Gout 01/04/2012  ? Hypertension 01/04/2012  ? ?Past Medical History:  ?Diagnosis Date  ? Chronic diarrhea   ? CKD (chronic kidney disease) stage 3, GFR 30-59 ml/min (HCC)   ? COPD (chronic obstructive pulmonary disease) ([place])   ? Dysrhythmia 02/2022  ? paroxsymal SVT & aflutter 02/2022 XioXT  ? Essential hypertension   ? Gallstones   ? Gout   ? History of CHF (congestive heart failure)   ? History of colonic polyps   ? History of DVT (deep vein thrombosis) 04/19/2012  ? BLE DVT 04/09/12  ? Kidney stones   ? Lumbar disc disease   ? Neuropathy   ? Nonischemic cardiomyopathy ([place]) 2003  ? Minor coronary atherosclerosis at cardiac catheterization 2003, LVEF initially 20% with normalization  ? Obesity   ? Orthostatic hypotension   ? RA (rheumatoid arthritis) ([place])   ? Sleep apnea   ? Subarachnoid hemorrhage ([place]) 02/03/2022  ? Traumatic  ? Subdural hematoma ([place]) 02/03/2022  ? Traumatic  ? Syncope, vasovagal   ? Type 2 diabetes mellitus ([place])   ?  ?Past Surgical History:  ?Procedure Laterality Date  ? BARIATRIC SURGERY  01/11/2018  ? CHOLECYSTECTOMY  01/18/2012  ? Procedure: LAPAROSCOPIC CHOLECYSTECTOMY WITH INTRAOPERATIVE  CHOLANGIOGRAM;  Surgeon: Joyice Faster. Cornett, MD;  Location: [facility];  Service: General;  Laterality: N/A;  ? EYE SURGERY    ? Film over retina was removed.  ? PILONIDAL CYST EXCISION  1997  ? UMBILICAL HERNIA REPAIR    ? VARICOSE VEIN SURGERY    ? left leg  ?  ?Medications Prior to Admission  ?Medication Sig Dispense Refill Last Dose  ? acetaminophen (TYLENOL) 500 MG tablet Take 500 mg by mouth every 6 (six) hours as needed for mild pain.   05/04/2022 at 0600  ? allopurinol (ZYLOPRIM) 300 MG tablet Take 1 tablet (300 mg total) by mouth every morning. 90 tablet 2 05/04/2022 at 0600  ? amLODipine (NORVASC) 5 MG tablet Take 1 tablet (5 mg total) by mouth daily. for blood pressure 90 tablet 2 05/04/2022 at 0600  ? atorvastatin (LIPITOR) 80 MG tablet TAKE 1 TABLET BY MOUTH  DAILY AT 6 PM 90 tablet 3 05/03/2022  ? calcitRIOL (ROCALTROL) 0.25 MCG capsule Take 1 capsule (0.25 mcg total) by mouth daily with breakfast. 90 capsule 3 05/03/2022  ? calcium carbonate (OSCAL) 1500 (600 Ca) MG TABS tablet Take 600 mg of elemental calcium by mouth 2 (two) times daily with a meal.   05/03/2022  ? carvedilol (COREG) 3.125 MG tablet Take 3.125 mg by mouth in the morning.   05/04/2022 at 0600  ? carvedilol (COREG) 6.25 MG tablet Take 0.5 tablets (3.125 mg total) by mouth 2 (two) times  daily. (Patient taking differently: Take 6.25 mg by mouth every evening.) 180 tablet 3   ? celecoxib (CELEBREX) 200 MG capsule TAKE 1 CAPSULE BY MOUTH DAILY  WITH FOOD 90 capsule 2 05/03/2022  ? Colchicine 0.6 MG CAPS Take 1 capsule by mouth daily. (Patient taking differently: Take 1 capsule by mouth daily as needed (gout flares.).) 90 capsule 2 05/03/2022  ? dexamethasone (DECADRON) 4 MG tablet as directed.     ? DULoxetine (CYMBALTA) 30 MG capsule Take 1 capsule (30 mg total) by mouth 2 (two) times daily. 180 capsule 2 05/03/2022  ? ferrous sulfate 325 (65 FE) MG tablet Take 325 mg by mouth in the morning.   05/03/2022  ? furosemide (LASIX) 40 MG tablet Take 1 tablet (40 mg  total) by mouth every morning. 90 tablet 2 05/03/2022  ? hydroxychloroquine (PLAQUENIL) 200 MG tablet Take 1 tablet by mouth in the morning and at bedtime.   05/04/2022 at 0600  ? lisinopril (ZESTRIL) 20 MG tablet TAKE 1 TABLET BY MOUTH  DAILY 90 tablet 3 05/03/2022  ? metFORMIN (GLUCOPHAGE-XR) 500 MG 24 hr tablet Take 1 tablet (500 mg total) by mouth daily with breakfast. 90 tablet 3 05/03/2022  ? Multiple Vitamin (MULTIVITAMIN) capsule Take 1 capsule by mouth daily.   05/03/2022  ? pantoprazole (PROTONIX) 40 MG tablet Take 1 tablet (40 mg total) by mouth daily. 90 tablet 2 05/03/2022  ? potassium chloride SA (KLOR-CON M) 20 MEQ tablet TAKE 1 TABLET BY MOUTH DAILY FOR POTASSIUM REPLACEMENT/  SUPPLEMENT 90 tablet 2 05/03/2022  ? tamsulosin (FLOMAX) 0.4 MG CAPS capsule Take 1 capsule (0.4 mg total) by mouth at bedtime. 90 capsule 2 05/03/2022  ? traMADol (ULTRAM) 50 MG tablet Take 50 mg by mouth 4 (four) times daily as needed (pain.).   05/03/2022  ? vitamin B-12 (CYANOCOBALAMIN) 500 MCG tablet Take 500 mcg by mouth daily.   05/03/2022  ? fluocinolone (SYNALAR) 0.01 % external solution Place 3-5 drops in each ear canal twice daily (Patient not taking: Reported on 04/29/2022) 60 mL 5 Not Taking  ? fluticasone (FLONASE) 50 MCG/ACT nasal spray USE 2 SPRAYS IN BOTH  NOSTRILS DAILY AS NEEDED  FOR ALLERGIES OR RHINITIS (Patient not taking: Reported on 04/29/2022) 48 g 2 Not Taking  ? ondansetron (ZOFRAN-ODT) 4 MG disintegrating tablet Take 1 tablet (4 mg total) by mouth every 6 (six) hours as needed for nausea. (Patient not taking: Reported on 04/29/2022) 30 tablet 0 Not Taking  ? sildenafil (REVATIO) 20 MG tablet Take 2-5 pills at once, orally, with each sexual encounter (Patient not taking: Reported on 04/29/2022) 50 tablet 5 Not Taking  ? ?Allergies  ?Allergen Reactions  ? Ciprofloxacin Hives  ? Definity [Perflutren Lipid Microsphere] Nausea And Vomiting  ?  During ECHO with Definity injection pt became nauseated and vomited   ? Levofloxacin  [Levofloxacin] Hives  ?  ?Social History  ? ?Tobacco Use  ? Smoking status: Former  ?  Types: Cigarettes  ?  Quit date: 12/26/1994  ?  Years since quitting: 27.3  ? Smokeless tobacco: Never  ?Substance Use Topics  ? Alcohol use: No  ?  ?Family History  ?Problem Relation Age of Onset  ? Hypertension Mother   ? Stroke Mother   ? Diabetes Father   ? Hypertension Father   ? Heart disease Father   ? Diabetes Brother   ? Colon cancer Neg Hx   ?  ? ?Review of Systems ?Pertinent items are noted in HPI. ? ?  Objective:  ? ?Patient Vitals for the past 8 hrs: ? BP Temp Temp src Pulse Resp SpO2 Height Weight  ?05/04/22 1115 (!) 198/64 97.6 ?F (36.4 ?C) Oral (!) 55 17 99 % 6\' 1"  (1.854 m) 136.1 kg  ? ?No intake/output data recorded. ?No intake/output data recorded. ? ? ? ? ? General : Alert, cooperative, no distress, appears stated age.obese  ? Head:  Normocephalic/atraumatic   ? Eyes: PERRL, conjunctiva/corneas clear, EOM's intact. Fundi could not be visualized ?Neck: Supple ?Chest:  Respirations unlabored ?Chest wall: no tenderness or deformity ?Heart: Regular rate and rhythm ?Abdomen: Soft, nontender and nondistended ?Extremities: warm and well-perfused ?Skin: normal turgor, color and texture ?Neurologic:  Alert, oriented x 3.  Eyes open spontaneously. PERRL, EOMI, VFC, no facial droop. V1-3 intact.  No dysarthria, tongue protrusion symmetric.  CNII-XII intact. Normal strength, sensation and reflexes throughout.  Left pronator drift, full strength in legs  ?  ? ? ? ?Data ReviewCBC:  ?Lab Results  ?Component Value Date  ? WBC 8.6 05/04/2022  ? RBC 3.92 (L) 05/04/2022  ? ?BMP:  ?Lab Results  ?Component Value Date  ? GLUCOSE 151 (H) 03/23/2022  ? GLUCOSE 224 (H) 02/10/2022  ? CO2 25 03/23/2022  ? BUN 26 03/23/2022  ? CREATININE 1.00 03/23/2022  ? CALCIUM 9.1 03/23/2022  ? ?Radiology review:  ?R subacute SDH ? ?Assessment:  ? ?Active Problems: ?  * No active hospital problems. * ? ?Enlarging R subacute SDH ?Plan:  ? ?- burr hole  drainage today ?- risks, benefits, alternatives, and expected convalescence discussed.  He wished to proceed and informed consent obtained. ? ? ?

## 2022-05-04 NOTE — Anesthesia Postprocedure Evaluation (Signed)
Anesthesia Post Note ? ?Patient: Mitchell DILORENZO Sr. ? ?Procedure(s) Performed: BURR HOLES FOR Subdural Hematoma (Right) ? ?  ? ?Patient location during evaluation: PACU ?Anesthesia Type: General ?Level of consciousness: awake and alert ?Pain management: pain level controlled ?Vital Signs Assessment: post-procedure vital signs reviewed and stable ?Respiratory status: spontaneous breathing, nonlabored ventilation, respiratory function stable and patient connected to nasal cannula oxygen ?Cardiovascular status: blood pressure returned to baseline and stable ?Postop Assessment: no apparent nausea or vomiting ?Anesthetic complications: no ? ? ?No notable events documented. ? ?Last Vitals:  ?Vitals:  ? 05/04/22 1635 05/04/22 1640  ?BP:    ?Pulse: (!) 48 (!) 50  ?Resp: 16 19  ?Temp:  36.8 ?C  ?SpO2:  97%  ?  ?Last Pain:  ?Vitals:  ? 05/04/22 1625  ?TempSrc:   ?PainSc: Asleep  ? ? ?  ?  ?  ?  ?  ?  ? ?Macrae Wiegman S ? ? ? ? ?

## 2022-05-04 NOTE — Op Note (Signed)
Procedure(s): ?[facility] Procedure Note ? ? ? ?Procedure(s) and Anesthesia Type: ?   Right sided burr holes and placement of subdural drain for evacuation of subdural hematoma ? ?Surgeon(s) and Role: ?   Maisie Fus, Coy Saunas, MD - Primary ?   McDaniels, Josh - Assisting ? ? ?Indications: This is a 71 yo M s/p recent head trauma who developed an enlarging right subacute subdural hematoma with significant mass effect.  Risks, benefits, alternatives, and expected convalescence were discussed with the patient.  Risks discussed included but were not limited to bleeding, pain, infection, seizure, stroke, scar, subdural recurrence, neurologic deficit, coma, and death.  Informed consent was obtained. ? ?Surgeon: Bedelia Person  ? ?Assistants: Docia Barrier, MD.   He provided  assistance throughout the case.  No qualified trainees were available to assist with the procedure. ? ?Anesthesia: General endotracheal anesthesia ? ? ?Procedure in detail: ? ?The patient was brought to the operating room.  A timeout was performed.  General anesthesia was induced and patient was intubated by the anesthesia service.  After appropriate lines and monitors were placed, patient's head was turned to the left and scalp was shaved, preprepped with alcohol and cleansing solution and prepped and draped in sterile fashion.  1% lidocaine with epinephrine injected into the planned incisions.  2 incisions were made with a 10 blade above the superior temporal line, one in the frontal region and one in the parietal region.  The periosteum was swept off the skull and self-retaining retractors were placed.  Perforator was used to prep performed bur holes at each incision.  The dura was then coagulated and opened in cruciate manner.  Superficial membrane was coagulated and opened at each bur hole and chronic subdural fluid was encountered.  The subdural space was irrigated thoroughly with good communication between the openings and the irrigation  returned clear.  The brain was inspected and appeared to be reexpanding appropriately.  At the posterior bur hole, the brain with a thin overlying membrane had returned to the skull. However, as it had not fully returned from its sunken status at the anterior bur hole, an EVD catheter was used as a subdural drain and placed in the anterior bur hole and tunneled out the skin and secured with a stitch.  The dural defects were covered with Gelfoam and the subdural space was irrigated one last time.  The incisions were closed with 2-0 Vicryl stitches and staples.  The subdural drain was connected to a drainage bag.    Sterile dressing was then placed on the incision.  Patient was then extubated by the anesthesia service moving all extremities.  All counts were correct at the end of surgery.  No complications were noted. ? ?Findings: ?Large chronic subdural hematoma ? ?Estimated Blood Loss:  10 ml ?        ?Drains: subdural drain ?       ?Specimens: none ?        ?Implants: none ?       ?Complications:  none ?        ?Disposition: To PACU ?        ?Condition: stable ? ?

## 2022-05-05 ENCOUNTER — Inpatient Hospital Stay (HOSPITAL_COMMUNITY): Payer: Medicare Other

## 2022-05-05 ENCOUNTER — Encounter (HOSPITAL_COMMUNITY): Payer: Self-pay | Admitting: Neurosurgery

## 2022-05-05 ENCOUNTER — Other Ambulatory Visit (HOSPITAL_COMMUNITY): Payer: Medicare Other

## 2022-05-05 NOTE — Progress Notes (Signed)
Pt already placed on CPAP. RT will cont to monitor as needed. ?

## 2022-05-05 NOTE — Progress Notes (Signed)
Pt seen and examined. GCS 15, c/o mild headache.  D/c drain today and d/c home tomorrow ?

## 2022-05-05 NOTE — Evaluation (Signed)
Physical Therapy Evaluation ?Patient Details ?Name: Mitchell KOZLOWSKI Sr. ?MRN: [id number] ?DOB: 06/25/1951 ?Today's Date: 05/05/2022 ? ?History of Present Illness ? Pt is a 71 y/o male s/p R burr hole and placement of subdural drain secondary to SDH on 5/10. Pt had drain removed on 5/11. PMH includes PE, COPD, HTN, gout, OSA on CPAP, RA, and SDH.  ?Clinical Impression ? Pt admitted secondary to problem above with deficits below. Pt requiring min guard to ambulate to chair this session. Pt asymptomatic throughout. BP in sitting at 148/64 and following ambulation at 146/58; RN aware. Anticipate pt will progress well and will not require follow up PT at d/c. Will continue to follow acutely.    ?   ? ?Recommendations for follow up therapy are one component of a multi-disciplinary discharge planning process, led by the attending physician.  Recommendations may be updated based on patient status, additional functional criteria and insurance authorization. ? ?Follow Up Recommendations No PT follow up ? ?  ?Assistance Recommended at Discharge Intermittent Supervision/Assistance  ?Patient can return home with the following ? Assist for transportation;Assistance with cooking/housework ? ?  ?Equipment Recommendations None recommended by PT  ?Recommendations for Other Services ?    ?  ?Functional Status Assessment Patient has had a recent decline in their functional status and demonstrates the ability to make significant improvements in function in a reasonable and predictable amount of time.  ? ?  ?Precautions / Restrictions Precautions ?Precautions: Fall;Other (comment) ?Precaution Comments: Per notes goal of SBP to be <140 ?Restrictions ?Weight Bearing Restrictions: No  ? ?  ? ?Mobility ? Bed Mobility ?Overal bed mobility: Needs Assistance ?Bed Mobility: Supine to Sit ?  ?  ?Supine to sit: Min assist ?  ?  ?General bed mobility comments: Min A for trunk elevation. BP at 148/64; RN ok with proceding to chair. ?   ? ?Transfers ?Overall transfer level: Needs assistance ?Equipment used: None ?Transfers: Sit to/from Stand ?Sit to Stand: Min guard ?  ?  ?  ?  ?  ?General transfer comment: Min guard for safety. Pt asymptomatic upon standing ?  ? ?Ambulation/Gait ?Ambulation/Gait assistance: Min guard ?Gait Distance (Feet): 5 Feet ?Assistive device: None ?Gait Pattern/deviations: Step-through pattern, Decreased stride length ?Gait velocity: Decreased ?  ?  ?General Gait Details: Ambulated short distance to chair with min guard. Pt asymptomatic throughout and no overt LOB. BP following mobility at 146/58. RN aware ? ?Stairs ?  ?  ?  ?  ?  ? ?Wheelchair Mobility ?  ? ?Modified Rankin (Stroke Patients Only) ?  ? ?  ? ?Balance Overall balance assessment: Needs assistance ?Sitting-balance support: No upper extremity supported, Feet supported ?Sitting balance-Leahy Scale: Good ?  ?  ?Standing balance support: No upper extremity supported ?Standing balance-Leahy Scale: Fair ?  ?  ?  ?  ?  ?  ?  ?  ?  ?  ?  ?  ?   ? ? ? ?Pertinent Vitals/Pain Pain Assessment ?Pain Assessment: No/denies pain  ? ? ?Home Living Family/patient expects to be discharged to:: Private residence ?Living Arrangements: Spouse/significant other ?Available Help at Discharge: Family;Available 24 hours/day ?Type of Home: House ?Home Access: Stairs to enter ?Entrance Stairs-Rails: Can reach both;Right;Left ?Entrance Stairs-Number of Steps: 4 ?  ?Home Layout: One level ?Home Equipment: Cane - single Barista (2 wheels);Shower seat;BSC/3in1 ?   ?  ?Prior Function Prior Level of Function : Independent/Modified Independent ?  ?  ?  ?  ?  ?  ?  ?  ?  ? ? ?  Hand Dominance  ?   ? ?  ?Extremity/Trunk Assessment  ? Upper Extremity Assessment ?Upper Extremity Assessment: Defer to OT evaluation ?  ? ?Lower Extremity Assessment ?Lower Extremity Assessment: Generalized weakness ?  ? ?Cervical / Trunk Assessment ?Cervical / Trunk Assessment: Normal  ?Communication  ?  Communication: No difficulties  ?Cognition Arousal/Alertness: Awake/alert ?Behavior During Therapy: [facility] for tasks assessed/performed ?Overall Cognitive Status: Within Functional Limits for tasks assessed ?  ?  ?  ?  ?  ?  ?  ?  ?  ?  ?  ?  ?  ?  ?  ?  ?General Comments: For basic tasks. A&O X4 ?  ?  ? ?  ?General Comments General comments (skin integrity, edema, etc.): Pt's wife present throughout ? ?  ?Exercises    ? ?Assessment/Plan  ?  ?PT Assessment Patient needs continued PT services  ?PT Problem List Decreased activity tolerance;Decreased mobility;Decreased strength ? ?   ?  ?PT Treatment Interventions Gait training;Stair training;Functional mobility training;Therapeutic activities;Balance training;Therapeutic exercise;Patient/family education   ? ?PT Goals (Current goals can be found in the Care Plan section)  ?Acute Rehab PT Goals ?Patient Stated Goal: to go home ?PT Goal Formulation: With patient ?Time For Goal Achievement: 05/19/22 ?Potential to Achieve Goals: Good ? ?  ?Frequency Min 3X/week ?  ? ? ?Co-evaluation   ?  ?  ?  ?  ? ? ?  ?AM-PAC PT "6 Clicks" Mobility  ?Outcome Measure Help needed turning from your back to your side while in a flat bed without using bedrails?: None ?Help needed moving from lying on your back to sitting on the side of a flat bed without using bedrails?: A Little ?Help needed moving to and from a bed to a chair (including a wheelchair)?: A Little ?Help needed standing up from a chair using your arms (e.g., wheelchair or bedside chair)?: A Little ?Help needed to walk in hospital room?: A Little ?Help needed climbing 3-5 steps with a railing? : A Little ?6 Click Score: 19 ? ?  ?End of Session Equipment Utilized During Treatment: Gait belt ?Activity Tolerance: Patient tolerated treatment well ?Patient left: in chair;with call bell/phone within reach;with chair alarm set;with family/visitor present ?Nurse Communication: Mobility status ?PT Visit Diagnosis: Other abnormalities of  gait and mobility (R26.89) ?  ? ?Time: [id number] ?PT Time Calculation (min) (ACUTE ONLY): 19 min ? ? ?Charges:   PT Evaluation ?$PT Eval Moderate Complexity: 1 Mod ?  ?  ?   ? ? ?Reuel Derby, PT, DPT  ?Acute Rehabilitation Services  ?Pager: [phone] ?Office: [phone] ? ? ?[place] ?05/05/2022, 3:54 PM ? ?

## 2022-05-05 NOTE — Progress Notes (Signed)
Subjective: ?Patient reports mild to moderate frontal headaches. Headaches 4-5/10 overnight, now 1-2/10. Headaches respond well to PO pain meds. No acute events overnight.  ? ?Objective: ?Vital signs in last 24 hours: ?Temp:  [97.6 ?F (36.4 ?C)-98.7 ?F (37.1 ?C)] 98.7 ?F (37.1 ?C) (05/11 0400) ?Pulse Rate:  [48-65] 61 (05/11 0700) ?Resp:  [10-21] 17 (05/11 0700) ?BP: (102-198)/(39-95) 133/53 (05/11 0700) ?SpO2:  [93 %-100 %] 96 % (05/11 0700) ?Weight:  [136.1 kg] 136.1 kg (05/10 1115) ? ?Intake/Output from previous day: ?05/10 0701 - 05/11 0700 ?In: 3203.1 [I.V.:3103.1; IV Piggyback:100] ?Out: 1050 [Urine:950; Drains:25; Blood:75] ?Intake/Output this shift: ?No intake/output data recorded. ? ?Physical Exam: Patient is awake, A/O X 4, conversant, and in good spirits. Eyes open spontaneously. They are in NAD and VSS. Doing well. Speech is fluent and appropriate. MAEW with good strength that is symmetric bilaterally.  BUE 5/5 throughout, BLE 5/5 throughout. Sensation to light touch is intact. PERLA, EOMI. CNs grossly intact. No facial asymmetry. No pronator drift. Dressings dry and intact with a small amount of old drainage. Incisions are well approximated with no active drainage, erythema, or edema. Subdural drain with approximately 25 ml of serosanguineous drainage. ? ? ? ? ?Lab Results: ?Recent Labs  ?  05/04/22 ?1203  ?WBC 8.6  ?HGB 13.3  ?HCT 39.1  ?PLT 87*  ? ?BMET ?No results for input(s): NA, K, CL, CO2, GLUCOSE, BUN, CREATININE, CALCIUM in the last 72 hours. ? ?Studies/Results: ?CT HEAD WO CONTRAST ? ?Result Date: 05/05/2022 ?CLINICAL DATA:  Follow-up subdural hematoma EXAM: CT HEAD WITHOUT CONTRAST TECHNIQUE: Contiguous axial images were obtained from the base of the skull through the vertex without intravenous contrast. RADIATION DOSE REDUCTION: This exam was performed according to the departmental dose-optimization program which includes automated exposure control, adjustment of the mA and/or kV according to  patient size and/or use of iterative reconstruction technique. COMPARISON:  02/10/2022 FINDINGS: Brain: Interval decompression of subdural hematoma on the right with improved cerebral mass effect and nearly resolved midline shift. Residual subdural collection on the right is mixed density (primarily low-density) with drain in place. Maximal residual collection measures 13 mm at the level of the drain entry. No complicating infarct or hydrocephalus. Vascular: No hyperdense vessel or unexpected calcification. Skull: Right-sided drainage holes.  No acute finding. Sinuses/Orbits: Negative IMPRESSION: No complicating features of subdural hematoma decompression. Cerebral mass effect is improved. Electronically Signed   By: Jorje Guild M.D.   On: 05/05/2022 04:38   ? ?Assessment/Plan: ?Patient is post-op day 1 s/p right sided burr holes and placement of subdural drain for evacuation of subdural hematoma. He is recovering well and reports mild to moderate frontal headaches that responds well to POP analgesics. His neurological examination is at his baseline. CT head this morning revealed appropriately decompressed SDH without any complicating features. Mass effect has improved. BPs continue to be elevated and requiring a Cardene gtt for BP goal < 140. Will work on titrating Cardene off and removal of subdural drain. Will likely be ready for discharge tomorrow.  ? ? ?-SBP < 140 ?-Titrate down Cardene as able ?-Will plan to remove subdural drain this afternoon. ?-Pain control,  ?-Encourage mobility ? ? LOS: 1 day  ? ? ? ?Marvis Moeller, DNP, NP-C ?05/05/2022, 8:22 AM ? ? ? ? ?

## 2022-05-05 NOTE — Progress Notes (Signed)
?  Transition of Care (TOC) Screening Note ? ? ?Patient Details  ?Name: Mitchell COTTEN Sr. ?Date of Birth: 09-Apr-1951 ? ? ?Transition of Care [facility]) CM/SW Contact:    ?Glennon Mac, RN ?Phone Number: ?05/05/2022, 5:06 PM ? ? ? ?Transition of Care Department [facility]) has reviewed patient and no TOC needs have been identified at this time. We will continue to monitor patient advancement through interdisciplinary progression rounds. If new patient transition needs arise, please place a TOC consult. ? ?Quintella Baton, RN, BSN  ?Trauma/Neuro ICU Case Manager ?[phone] ? ?

## 2022-05-06 LAB — BASIC METABOLIC PANEL
Anion gap: 5 (ref 5–15)
BUN: 33 mg/dL — ABNORMAL HIGH (ref 8–23)
CO2: 19 mmol/L — ABNORMAL LOW (ref 22–32)
Calcium: 7.7 mg/dL — ABNORMAL LOW (ref 8.9–10.3)
Chloride: 111 mmol/L (ref 98–111)
Creatinine, Ser: 1.08 mg/dL (ref 0.61–1.24)
GFR, Estimated: >60 mL/min (ref >60)
Glucose, Bld: 251 mg/dL — ABNORMAL HIGH (ref 70–99)
Potassium: 5 mmol/L (ref 3.5–5.1)
Sodium: 135 mmol/L (ref 135–145)

## 2022-05-06 MED ORDER — HYDROCODONE-ACETAMINOPHEN 5-325 MG PO TABS: 1.0000 | ORAL_TABLET | ORAL | 0 refills | Status: DC | PRN

## 2022-05-06 MED ORDER — LABETALOL HCL 5 MG/ML IV SOLN: 10.0000 mg | INTRAVENOUS | Status: DC | PRN

## 2022-05-06 MED ORDER — LEVETIRACETAM 500 MG PO TABS: 500.0000 mg | ORAL_TABLET | Freq: Two times a day (BID) | ORAL | 0 refills | Status: DC

## 2022-05-06 MED ORDER — ENALAPRILAT 1.25 MG/ML IV SOLN: 1.2500 mg | Freq: Four times a day (QID) | INTRAVENOUS | Status: DC | PRN

## 2022-05-06 NOTE — Discharge Summary (Signed)
Physician Discharge Summary  Patient ID: Mitchell Parker. MRN: [id number] DOB/AGE: 69/22/1952 69 y.o.  Admit date: 05/04/2022 Discharge date: 05/06/2022  Admission Diagnoses: Chronic subdural hematoma  Discharge Diagnoses: Chronic subdural hematoma Active Problems:   Subdural hematoma [facility])   Discharged Condition: good  Hospital Course: The patient was admitted on 05/04/2022 and taken to the operating room where the patient underwent right sided burr holes and placement of subdural drain for evacuation of subdural hematoma. The patient tolerated the procedure well and was taken to the recovery room and then to the floor in stable condition. The hospital course was routine. There were no complications. The wounds remained clean dry and intact. Pt had appropriate incisional soreness. No complaints neurological deficits. He hasd a mild frontal headache that responded well to PO analgesics. The patient remained afebrile with stable vital signs, and tolerated a regular diet. The patient continued to increase activities, and pain was well controlled with oral pain medications.  Consults: None  Significant Diagnostic Studies: radiology: CT scan: head  Treatments: surgery: Right sided burr holes and placement of subdural drain for evacuation of subdural hematoma  Discharge Exam: Blood pressure (!) 154/54, pulse (!) 54, temperature 97.9 F (36.6 C), temperature source Oral, resp. rate 16, height 6\' 1"  (1.854 m), weight 136.1 kg, SpO2 98 %. Physical Exam: Patient is awake, A/O X 4, conversant, and in good spirits. Eyes open spontaneously. They are in NAD and VSS. Doing well. Speech is fluent and appropriate. MAEW with good strength that is symmetric bilaterally.  BUE 5/5 throughout, BLE 5/5 throughout. Sensation to light touch is intact. PERLA, EOMI. CNs grossly intact. Dressings are dry and intact with a small amount of old drainage. Incisions are well approximated with no drainage, erythema, or  edema.     Disposition: Discharge disposition: 01-Home or Self Care       Discharge Instructions     Ambulatory referral to Physical Therapy   Complete by: As directed       Allergies as of 05/06/2022       Reactions   Ciprofloxacin Hives   Definity [perflutren Lipid Microsphere] Nausea And Vomiting   During ECHO with Definity injection pt became nauseated and vomited    Levofloxacin [levofloxacin] Hives        Medication List     TAKE these medications    acetaminophen 500 MG tablet Commonly known as: TYLENOL Take 500 mg by mouth every 6 (six) hours as needed for mild pain.   allopurinol 300 MG tablet Commonly known as: ZYLOPRIM Take 1 tablet (300 mg total) by mouth every morning.   amLODipine 5 MG tablet Commonly known as: NORVASC Take 1 tablet (5 mg total) by mouth daily. for blood pressure   atorvastatin 80 MG tablet Commonly known as: LIPITOR TAKE 1 TABLET BY MOUTH  DAILY AT 6 PM   calcitRIOL 0.25 MCG capsule Commonly known as: ROCALTROL Take 1 capsule (0.25 mcg total) by mouth daily with breakfast.   calcium carbonate 1500 (600 Ca) MG Tabs tablet Commonly known as: OSCAL Take 600 mg of elemental calcium by mouth 2 (two) times daily with a meal.   carvedilol 3.125 MG tablet Commonly known as: COREG Take 3.125 mg by mouth in the morning. What changed: Another medication with the same name was changed. Make sure you understand how and when to take each.   carvedilol 6.25 MG tablet Commonly known as: COREG Take 0.5 tablets (3.125 mg total) by mouth 2 (two) times daily. What  changed:  how much to take when to take this   celecoxib 200 MG capsule Commonly known as: CELEBREX TAKE 1 CAPSULE BY MOUTH DAILY  WITH FOOD   Colchicine 0.6 MG Caps Take 1 capsule by mouth daily. What changed:  when to take this reasons to take this   dexamethasone 4 MG tablet Commonly known as: DECADRON as directed.   DULoxetine 30 MG capsule Commonly known  as: CYMBALTA Take 1 capsule (30 mg total) by mouth 2 (two) times daily.   ferrous sulfate 325 (65 FE) MG tablet Take 325 mg by mouth in the morning.   fluocinolone 0.01 % external solution Commonly known as: Synalar Place 3-5 drops in each ear canal twice daily   fluticasone 50 MCG/ACT nasal spray Commonly known as: FLONASE USE 2 SPRAYS IN BOTH  NOSTRILS DAILY AS NEEDED  FOR ALLERGIES OR RHINITIS   furosemide 40 MG tablet Commonly known as: LASIX Take 1 tablet (40 mg total) by mouth every morning.   HYDROcodone-acetaminophen 5-325 MG tablet Commonly known as: NORCO/VICODIN Take 1 tablet by mouth every 4 (four) hours as needed for moderate pain.   hydroxychloroquine 200 MG tablet Commonly known as: PLAQUENIL Take 1 tablet by mouth in the morning and at bedtime.   levETIRAcetam 500 MG tablet Commonly known as: KEPPRA Take 1 tablet (500 mg total) by mouth 2 (two) times daily.   lisinopril 20 MG tablet Commonly known as: ZESTRIL TAKE 1 TABLET BY MOUTH  DAILY   metFORMIN 500 MG 24 hr tablet Commonly known as: GLUCOPHAGE-XR Take 1 tablet (500 mg total) by mouth daily with breakfast.   multivitamin capsule Take 1 capsule by mouth daily.   ondansetron 4 MG disintegrating tablet Commonly known as: ZOFRAN-ODT Take 1 tablet (4 mg total) by mouth every 6 (six) hours as needed for nausea.   pantoprazole 40 MG tablet Commonly known as: PROTONIX Take 1 tablet (40 mg total) by mouth daily.   potassium chloride SA 20 MEQ tablet Commonly known as: KLOR-CON M TAKE 1 TABLET BY MOUTH DAILY FOR POTASSIUM REPLACEMENT/  SUPPLEMENT   sildenafil 20 MG tablet Commonly known as: REVATIO Take 2-5 pills at once, orally, with each sexual encounter   tamsulosin 0.4 MG Caps capsule Commonly known as: FLOMAX Take 1 capsule (0.4 mg total) by mouth at bedtime.   traMADol 50 MG tablet Commonly known as: ULTRAM Take 50 mg by mouth 4 (four) times daily as needed (pain.).   vitamin B-12 500  MCG tablet Commonly known as: CYANOCOBALAMIN Take 500 mcg by mouth daily.        Follow-up Information     [facility]. Call.   Specialty: Rehabilitation Why: Outpatient physical therapy; please call ASAP to schedule appointment.  An electronic referral has been sent to rehab center on your behalf. Contact information: [id number] [place] [phone]                Signed: Marvis Moeller, DNP, NP-C 05/06/2022, 1:55 PM

## 2022-05-06 NOTE — Progress Notes (Signed)
Discussed SBP goal with Josh neurosurgery NP. Per Josh okay for SBP < 160 to allow Cardene infusion discontinuation. RN will titrate down Cardene.  ?

## 2022-05-06 NOTE — TOC Transition Note (Signed)
Transition of Care (TOC) - CM/SW Discharge Note ? ? ?Patient Details  ?Name: BRAIN AGOSTA Sr. ?MRN: [id number] ?Date of Birth: 1951-06-22 ? ?Transition of Care [facility]) CM/SW Contact:  ?Glennon Mac, RN ?Phone Number: ?05/06/2022, 11:24 AM ? ? ?Clinical Narrative:    ?Pt is a 71 y/o male s/p R burr hole and placement of subdural drain secondary to SDH on 5/10. Pt had drain removed on 5/11.  ?PTA, pt independent and living at home with spouse, who can provide 24h assistance at dc.  PT recommending OP PT follow up, and pt agreeable to services; he prefers therapy in [place], near his home.  Referral sent to [facility] in [place].   ? ? ?Final next level of care: OP Rehab ?Barriers to Discharge: Barriers Resolved ? ? ?  ?           ?  ?  ?  ?  ? ?Discharge Plan and Services ?  ?Discharge Planning Services: CM Consult ?           ?  ?  ?  ?  ?  ?  ?  ?  ?  ?  ? ?Social Determinants of Health (SDOH) Interventions ?  ? ? ?Readmission Risk Interventions ?   ? View : No data to display.  ?  ?  ?  ? ?Quintella Baton, RN, BSN  ?Trauma/Neuro ICU Case Manager ?[phone] ? ? ? ? ?

## 2022-05-06 NOTE — Progress Notes (Signed)
?   05/06/22 1218  ?AVS Discharge Documentation  ?AVS Discharge Instructions Including Medications Provided to patient/caregiver  ?Name of Person Receiving AVS Discharge Instructions Including Medications Mitchell Parker  ?Name of Clinician That Reviewed AVS Discharge Instructions Including Medications Renold Don, RN  ? ?Belongings returned to pt prior to discharge, pt voices no further questions after AVS discharge information given. IV's removed without complication.  ?

## 2022-05-06 NOTE — Evaluation (Signed)
Occupational Therapy Evaluation ?Patient Details ?Name: Mitchell RAWLES Sr. ?MRN: [id number] ?DOB: October 19, 1951 ?Today's Date: 05/06/2022 ? ? ?History of Present Illness Pt is a 71 y/o male s/p R burr hole and placement of subdural drain secondary to SDH on 5/10. Pt had drain removed on 5/11. PMH includes PE, COPD, HTN, gout, OSA on CPAP, RA, and SDH.  ? ?Clinical Impression ?  ?PTA pt had returned to "normal activity" since his last fall, with the exception of work. Pt appears close to his baseline, however endorses increased difficulty with attention, memory and becoming "frustrated easily". Pt's goal is to return to working at [employer]. If pt continues to have difficulty with these cognitive/behavioral issues, recommend neuropsych eval to further assess and educate pt on strategies to facilitate return to work. Discussed with pt/wfie and they verbalized understanding. Wife states she will S all medication/financial management once discharged. No further OT needed at this time.   ?   ? ?Recommendations for follow up therapy are one component of a multi-disciplinary discharge planning process, led by the attending physician.  Recommendations may be updated based on patient status, additional functional criteria and insurance authorization.  ? ?Follow Up Recommendations ? No OT follow up  ?  ?Assistance Recommended at Discharge Intermittent Supervision/Assistance  ?Patient can return home with the following A little help with bathing/dressing/bathroom;Assistance with cooking/housework;Direct supervision/assist for medications management;Direct supervision/assist for financial management;Assist for transportation ? ?  ?Functional Status Assessment ? Patient has had a recent decline in their functional status and demonstrates the ability to make significant improvements in function in a reasonable and predictable amount of time.  ?Equipment Recommendations ? None recommended by OT  ?  ?Recommendations for  Other Services Other (comment) (possible Neuro psych eval as an outpt) ? ? ?  ?Precautions / Restrictions Precautions ?Precautions: Fall ?Precaution Comments: Per notes goal of SBP to be <140 ?Restrictions ?Weight Bearing Restrictions: No  ? ?  ? ?Mobility Bed Mobility ?Overal bed mobility: Needs Assistance ?Bed Mobility: Supine to Sit ?  ?  ?Supine to sit: HOB elevated, Min guard ?  ?  ?General bed mobility comments: for safety due to close to edge of the bed ?  ? ?Transfers ?Overall transfer level: Needs assistance ?Equipment used: None ?Transfers: Sit to/from Stand ?Sit to Stand: Min guard ?  ?  ?  ?  ?  ?General transfer comment: assist for balance ?  ? ?  ?Balance Overall balance assessment: Needs assistance ?Sitting-balance support: No upper extremity supported ?Sitting balance-Leahy Scale: Good ?  ?  ?Standing balance support: No upper extremity supported ?Standing balance-Leahy Scale: Good ?  ?  ?  ?  ?  ?  ?  ?  ?  ?  ?  ?  ?   ? ?ADL either performed or assessed with clinical judgement  ? ?ADL Overall ADL's : At baseline ?  ?  ?  ?  ?  ?  ?  ?  ?  ?  ?  ?  ?  ?  ?  ?  ?  ?  ?  ?General ADL Comments: uses  AE for LB ADL; issued tubing to help with grips due to arthritis; educated on availability of large grips  ? ? ? ?Vision Baseline Vision/History: 1 Wears glasses ?Patient Visual Report: No change from baseline;Other (comment) (follow with a retina specialist) ?Vision Assessment?: No apparent visual deficits  ?   ?Perception   ?  ?Praxis   ?  ? ?  Pertinent Vitals/Pain Pain Assessment ?Pain Assessment: Faces ?Faces Pain Scale: Hurts a little bit ?Pain Location: staples at prior drain location ?Pain Descriptors / Indicators: Sore ?Pain Intervention(s): Limited activity within patient's tolerance  ? ? ? ?Hand Dominance Right ?  ?Extremity/Trunk Assessment Upper Extremity Assessment ?Upper Extremity Assessment: Overall WFL for tasks assessed (has arthritis B hands) ?  ?Lower Extremity Assessment ?Lower  Extremity Assessment: Defer to PT evaluation ?  ?Cervical / Trunk Assessment ?Cervical / Trunk Assessment: Normal (increased body habitus) ?  ?Communication Communication ?Communication: No difficulties ?  ?Cognition Arousal/Alertness: Awake/alert ?Behavior During Therapy: [facility] for tasks assessed/performed ?Overall Cognitive Status: Within Functional Limits for tasks assessed ?  ?  ?  ?  ?  ?  ?  ?  ?  ?  ?  ?  ?  ?  ?  ?  ?General Comments: For basic tasks. A&O X4; Scored within the normal range on the Short Blessed Test; able to complete serial subtraction by 3s quickly and without any difficulty ?  ?  ?General Comments  Discussed balance issues, safety for home with fall prevention and recommendations for outpatient PT for balance training ? ?  ?Exercises   ?  ?Shoulder Instructions    ? ? ?Home Living Family/patient expects to be discharged to:: Private residence ?Living Arrangements: Spouse/significant other ?Available Help at Discharge: Family;Available 24 hours/day ?Type of Home: House ?Home Access: Stairs to enter ?Entrance Stairs-Number of Steps: 4 ?Entrance Stairs-Rails: Can reach both;Right;Left ?Home Layout: One level ?  ?  ?Bathroom Shower/Tub: Walk-in shower ?  ?Bathroom Toilet: Handicapped height ?Bathroom Accessibility: Yes ?How Accessible: Accessible via walker ?Home Equipment: Cane - single Barista (2 wheels);Shower seat;BSC/3in1;Grab bars - tub/shower ?  ?Additional Comments: Does not use a cane for at least a couple of years ?  ? ?  ?Prior Functioning/Environment Prior Level of Function : Independent/Modified Independent ?  ?  ?  ?  ?  ?  ?Mobility Comments: works for [employer] home [employer]; climbs ladders; operates "lifts" ?ADLs Comments: No assist needed, had returned to normal activity except for work ?  ? ?  ?  ?OT Problem List: Decreased knowledge of precautions ?  ?   ?OT Treatment/Interventions:    ?  ?OT Goals(Current goals can be found in the care plan section) Acute Rehab OT  Goals ?Patient Stated Goal: to go home today ?OT Goal Formulation: All assessment and education complete, DC therapy  ?OT Frequency:   ?  ? ?Co-evaluation   ?  ?  ?  ?  ? ?  ?AM-PAC OT "6 Clicks" Daily Activity     ?Outcome Measure Help from another person eating meals?: None ?Help from another person taking care of personal grooming?: None ?Help from another person toileting, which includes using toliet, bedpan, or urinal?: A Little ?Help from another person bathing (including washing, rinsing, drying)?: A Little ?Help from another person to put on and taking off regular upper body clothing?: None ?Help from another person to put on and taking off regular lower body clothing?: A Little ?6 Click Score: 21 ?  ?End of Session Nurse Communication: Other (comment) (DC needs) ? ?Activity Tolerance: Patient tolerated treatment well ?Patient left: in chair;with call bell/phone within reach;with family/visitor present ? ?OT Visit Diagnosis: Dizziness and giddiness (R42)  ?              ?Time: [id number] ?OT Time Calculation (min): 27 min ?Charges:  OT General Charges ?$OT Visit: 1 Visit ?OT Evaluation ?$OT  Eval Moderate Complexity: 1 Mod ?OT Treatments ?$Self Care/Home Management : 8-22 mins ? ?[facility], OT/L  ? ?Acute OT Clinical Specialist ?Acute Rehabilitation Services ?Pager [phone] ?Office [phone]  ? ?Britt Theard,HILLARY ?05/06/2022, 11:56 AM ?

## 2022-05-06 NOTE — Plan of Care (Signed)

## 2022-05-06 NOTE — Progress Notes (Signed)
Physical Therapy Treatment ?Patient Details ?Name: Mitchell SHONE Sr. ?MRN: [id number] ?DOB: 15-Sep-1951 ?Today's Date: 05/06/2022 ? ? ?History of Present Illness Pt is a 71 y/o male s/p R burr hole and placement of subdural drain secondary to SDH on 5/10. Pt had drain removed on 5/11. PMH includes PE, COPD, HTN, gout, OSA on CPAP, RA, and SDH. ? ?  ?PT Comments  ? ? Patient able to ambulate in hallway and negotiate steps appropriate for home entry.  Patient demonstrates increased tendency for posterior LOB and is a fall risk based on Dynamic Gait Index with score of 16/24.  Recommend follow up outpatient rehab for balance training.  He lives in [place] so request referral to [facility] in [place].  PT will follow up if pt not d/c.    ?Recommendations for follow up therapy are one component of a multi-disciplinary discharge planning process, led by the attending physician.  Recommendations may be updated based on patient status, additional functional criteria and insurance authorization. ? ?Follow Up Recommendations ? Outpatient PT ?  ?  ?Assistance Recommended at Discharge Intermittent Supervision/Assistance  ?Patient can return home with the following Assist for transportation;Assistance with cooking/housework ?  ?Equipment Recommendations ? None recommended by PT  ?  ?Recommendations for Other Services   ? ? ?  ?Precautions / Restrictions Precautions ?Precautions: Fall ?Precaution Comments: Per notes goal of SBP to be <140 ?Restrictions ?Weight Bearing Restrictions: No  ?  ? ?Mobility ? Bed Mobility ?Overal bed mobility: Needs Assistance ?Bed Mobility: Supine to Sit ?  ?  ?Supine to sit: HOB elevated, Min guard ?  ?  ?General bed mobility comments: for safety due to close to edge of the bed ?  ? ?Transfers ?Overall transfer level: Needs assistance ?Equipment used: None ?Transfers: Sit to/from Stand ?Sit to Stand: Min guard ?  ?  ?  ?  ?  ?General transfer comment: assist for balance ?   ? ?Ambulation/Gait ?Ambulation/Gait assistance: Min guard ?Gait Distance (Feet): 200 Feet ?Assistive device: None ?Gait Pattern/deviations: Step-through pattern, Decreased stride length, Antalgic ?  ?  ?  ?General Gait Details: mild antalgia on L (hip OA); mild LOB with turning corner with self recovery, completed DGI ? ? ?Stairs ?Stairs: Yes ?Stairs assistance: Min guard ?Stair Management: One rail Right, Step to pattern, Forwards ?Number of Stairs: 4 ?General stair comments: assist for safety/balance step to pattern favoring L hip ? ? ?Wheelchair Mobility ?  ? ?Modified Rankin (Stroke Patients Only) ?  ? ? ?  ?Balance Overall balance assessment: Needs assistance ?Sitting-balance support: No upper extremity supported ?Sitting balance-Leahy Scale: Good ?  ?  ?  ?Standing balance-Leahy Scale: Good ?  ?  ?  ?  ?  ?  ?  ?  ?  ?Standardized Balance Assessment ?Standardized Balance Assessment : Dynamic Gait Index ?  ?Dynamic Gait Index ?Level Surface: Mild Impairment ?Change in Gait Speed: Mild Impairment ?Gait with Horizontal Head Turns: Mild Impairment ?Gait with Vertical Head Turns: Mild Impairment ?Gait and Pivot Turn: Mild Impairment ?Step Over Obstacle: Mild Impairment ?Step Around Obstacles: Normal ?Steps: Moderate Impairment ?Total Score: 16 ?  ? ?  ?Cognition Arousal/Alertness: Awake/alert ?Behavior During Therapy: [facility] for tasks assessed/performed ?Overall Cognitive Status: Within Functional Limits for tasks assessed ?  ?  ?  ?  ?  ?  ?  ?  ?  ?  ?  ?  ?  ?  ?  ?  ?  ?  ?  ? ?  ?  Exercises   ? ?  ?General Comments General comments (skin integrity, edema, etc.): Discussed balance issues, safety for home with fall prevention and recommendations for outpatient PT for balance training ?  ?  ? ?Pertinent Vitals/Pain Pain Assessment ?Pain Assessment: Faces ?Faces Pain Scale: Hurts a little bit ?Pain Location: staples at prior drain location ?Pain Descriptors / Indicators: Sore ?Pain Intervention(s): Monitored during  session  ? ? ?Home Living   ?  ?  ?  ?  ?  ?  ?  ?  ?  ?   ?  ?Prior Function    ?  ?  ?   ? ?PT Goals (current goals can now be found in the care plan section) Progress towards PT goals: Progressing toward goals ? ?  ?Frequency ? ? ? Min 3X/week ? ? ? ?  ?PT Plan Discharge plan needs to be updated  ? ? ?Co-evaluation   ?  ?  ?  ?  ? ?  ?AM-PAC PT "6 Clicks" Mobility   ?Outcome Measure ? Help needed turning from your back to your side while in a flat bed without using bedrails?: None ?Help needed moving from lying on your back to sitting on the side of a flat bed without using bedrails?: A Little ?Help needed moving to and from a bed to a chair (including a wheelchair)?: A Little ?Help needed standing up from a chair using your arms (e.g., wheelchair or bedside chair)?: A Little ?Help needed to walk in hospital room?: A Little ?Help needed climbing 3-5 steps with a railing? : A Little ?6 Click Score: 19 ? ?  ?End of Session Equipment Utilized During Treatment: Gait belt ?Activity Tolerance: Patient tolerated treatment well ?Patient left: in chair;with call bell/phone within reach ?  ?PT Visit Diagnosis: Other abnormalities of gait and mobility (R26.89) ?  ? ? ?Time: [id number] ?PT Time Calculation (min) (ACUTE ONLY): 28 min ? ?Charges:  $Gait Training: 8-22 mins ?$Self Care/Home Management: 8-22          ?          ? ?Magda Kiel, PT ?Acute Rehabilitation Services ?[id number] ?Office:[phone] ?05/06/2022 ? ? ? ?[name] ?05/06/2022, 10:51 AM ? ?

## 2022-05-06 NOTE — Plan of Care (Signed)
?  Problem: Education: ?Goal: Knowledge of General Education information will improve ?Description: Including pain rating scale, medication(s)/side effects and non-pharmacologic comfort measures ?05/06/2022 1147 by Sondra Barges, RN ?Outcome: Adequate for Discharge ?05/06/2022 0841 by Sondra Barges, RN ?Outcome: Progressing ?  ?Problem: Health Behavior/Discharge Planning: ?Goal: Ability to manage health-related needs will improve ?05/06/2022 1147 by Sondra Barges, RN ?Outcome: Adequate for Discharge ?05/06/2022 0841 by Sondra Barges, RN ?Outcome: Progressing ?  ?Problem: Clinical Measurements: ?Goal: Ability to maintain clinical measurements within normal limits will improve ?05/06/2022 1147 by Sondra Barges, RN ?Outcome: Adequate for Discharge ?05/06/2022 0841 by Sondra Barges, RN ?Outcome: Progressing ?Goal: Will remain free from infection ?Outcome: Adequate for Discharge ?Goal: Diagnostic test results will improve ?Outcome: Adequate for Discharge ?Goal: Respiratory complications will improve ?Outcome: Adequate for Discharge ?Goal: Cardiovascular complication will be avoided ?Outcome: Adequate for Discharge ?  ?Problem: Activity: ?Goal: Risk for activity intolerance will decrease ?Outcome: Adequate for Discharge ?  ?Problem: Nutrition: ?Goal: Adequate nutrition will be maintained ?Outcome: Adequate for Discharge ?  ?Problem: Coping: ?Goal: Level of anxiety will decrease ?Outcome: Adequate for Discharge ?  ?Problem: Elimination: ?Goal: Will not experience complications related to bowel motility ?Outcome: Adequate for Discharge ?Goal: Will not experience complications related to urinary retention ?Outcome: Adequate for Discharge ?  ?Problem: Pain Managment: ?Goal: General experience of comfort will improve ?Outcome: Adequate for Discharge ?  ?Problem: Safety: ?Goal: Ability to remain free from injury will improve ?Outcome: Adequate for Discharge ?  ?Problem: Skin Integrity: ?Goal: Risk for impaired skin  integrity will decrease ?Outcome: Adequate for Discharge ?  ?

## 2022-05-06 NOTE — Consult Note (Signed)
? ?  [facility] CM Inpatient Consult ? ? ?05/06/2022 ? ?SALAAM CERESA Sr. ?1951-08-09 ?[id number] ? 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 ?Primary Care Provider:  [name], MD is an embedded provider at [facility] with a Chronic Care Management team and program, and is listed for the transition of care follow up and appointments. ? ?Patient was screened for Embedded practice service needs for chronic care management and patient is for OP rehab for post hospital. ? ?Plan: Patient's provider is listed for the [facility] follow up calls and appointments. No other needs assessed at this time. ? ?Please contact for further questions, ? ?Charlesetta Shanks, RN BSN CCM ?[facility] [employer] Liaison ? [phone] business mobile phone ?Toll free office [phone]  ?Fax number: [phone] ?[email or web address] ?[email or web address] ? ? ? ?

## 2022-05-10 ENCOUNTER — Encounter: Payer: Self-pay | Admitting: Family Medicine

## 2022-05-10 ENCOUNTER — Ambulatory Visit (INDEPENDENT_AMBULATORY_CARE_PROVIDER_SITE_OTHER): Payer: Medicare Other | Admitting: Family Medicine

## 2022-05-10 VITALS — BP 161/70 | HR 55 | Temp 98.0°F | Ht 73.0 in | Wt 308.2 lb

## 2022-05-10 DIAGNOSIS — I10 Essential (primary) hypertension: Secondary | ICD-10-CM

## 2022-05-10 DIAGNOSIS — S065XAA Traumatic subdural hemorrhage with loss of consciousness status unknown, initial encounter: Secondary | ICD-10-CM | POA: Diagnosis not present

## 2022-05-10 DIAGNOSIS — R519 Headache, unspecified: Secondary | ICD-10-CM

## 2022-05-10 MED ORDER — HYDROCODONE-ACETAMINOPHEN 5-325 MG PO TABS: 1.0000 | ORAL_TABLET | ORAL | 0 refills | Status: DC | PRN

## 2022-05-10 MED ORDER — OLMESARTAN MEDOXOMIL 40 MG PO TABS: 40.0000 mg | ORAL_TABLET | Freq: Every day | ORAL | 1 refills | Status: DC

## 2022-05-10 NOTE — Progress Notes (Signed)
? ?Subjective:  ?Patient ID: Mitchell Parker., male    DOB: 08/30/1951  Age: 71 y.o. MRN: [id number] ? ?CC: Follow-up ? ? ?HPI ?Mitchell Rua Sr. presents for HA & elevated BP post burr holes drilled for subdural hematoma. HA all over head. 4-5/10 with use of the hydrocodone 7-8/10 without it.  Patient has been noted on CT scan to have shift of the brain due to the mass effect of the hematoma.  The bur holes were placed on May 10.  He was discharged on May 12.  He continues to have headaches as noted above.  The headache is described as a dull ache all over.  Of note is that the headaches had gone away prior to the surgery but it was felt to be necessary due to the mass effect.  His main concern and out of the surgeon that he conveys to me is for the blood pressure.  It was high in the hospital and remains high on today's readings as well.  He is currently taking multiple different medications for the headache including amlodipine and Coreg Lasix and lisinopril.  Even with that his systolic is running in the 160s. ? ? ?  05/10/2022  ?  9:56 AM 05/10/2022  ?  9:50 AM 03/23/2022  ?  8:10 AM  ?Depression screen PHQ 2/9  ?Decreased Interest 0 0 1  ?Down, Depressed, Hopeless 0 0 1  ?PHQ - 2 Score 0 0 2  ?Altered sleeping 2  1  ?Tired, decreased energy 2  1  ?Change in appetite 0  0  ?Feeling bad or failure about yourself  0  0  ?Trouble concentrating 0  1  ?Moving slowly or fidgety/restless 0  0  ?Suicidal thoughts 0  0  ?PHQ-9 Score 4  5  ?Difficult doing work/chores Not difficult at all  Very difficult  ? ? ?History ?Mitchell Parker has a past medical history of Chronic diarrhea, CKD (chronic kidney disease) stage 3, GFR 30-59 ml/min (HCC), COPD (chronic obstructive pulmonary disease) ([place]), Dysrhythmia (02/2022), Essential hypertension, Gallstones, Gout, History of CHF (congestive heart failure), History of colonic polyps, History of DVT (deep vein thrombosis) (04/19/2012), Kidney stones, Lumbar disc disease, Neuropathy,  Nonischemic cardiomyopathy ([place]) (2003), Obesity, Orthostatic hypotension, RA (rheumatoid arthritis) ([place]), Sleep apnea, Subarachnoid hemorrhage ([place]) (02/03/2022), Subdural hematoma ([place]) (02/03/2022), Syncope, vasovagal, and Type 2 diabetes mellitus ([place]).  ? ?He has a past surgical history that includes Umbilical hernia repair; Pilonidal cyst excision (1997); Varicose vein surgery; Cholecystectomy (01/18/2012); Bariatric Surgery (01/11/2018); Eye surgery; and [facility] ([place], 05/04/2022).  ? ?His family history includes Diabetes in his brother and father; Heart disease in his father; Hypertension in his father and mother; Stroke in his mother.He reports that he quit smoking about 27 years ago. His smoking use included cigarettes. He has never used smokeless tobacco. He reports that he does not drink alcohol and does not use drugs. ? ? ? ?ROS ?Review of Systems  ?Constitutional:  Negative for fever.  ?Respiratory:  Negative for shortness of breath.   ?Cardiovascular:  Negative for chest pain.  ?Musculoskeletal:  Negative for arthralgias.  ?Skin:  Negative for rash.  ?Neurological:  Positive for headaches.  ? ?Objective:  ?BP (!) 161/70   Pulse (!) 55   Temp 98 ?F (36.7 ?C)   Ht 6\' 1"  (1.854 m)   Wt (!) 308 lb 3.2 oz (139.8 kg)   SpO2 98%   BMI 40.66 kg/m?  ? ?BP Readings from Last 3 Encounters:  ?  05/10/22 (!) 161/70  ?05/06/22 (!) 154/54  ?03/29/22 (!) 148/50  ? ? ?Wt Readings from Last 3 Encounters:  ?05/10/22 (!) 308 lb 3.2 oz (139.8 kg)  ?05/04/22 300 lb (136.1 kg)  ?03/29/22 (!) 304 lb 6.4 oz (138.1 kg)  ? ? ? ?Physical Exam ?Vitals reviewed.  ?Constitutional:   ?   General: He is not in acute distress. ?   Appearance: He is well-developed. He is ill-appearing. He is not toxic-appearing.  ?HENT:  ?   Head: Normocephalic and atraumatic.  ?   Right Ear: External ear normal.  ?   Left Ear: External ear normal.  ?   Mouth/Throat:  ?   Pharynx: No oropharyngeal exudate or posterior oropharyngeal erythema.   ?Eyes:  ?   Pupils: Pupils are equal, round, and reactive to light.  ?Cardiovascular:  ?   Rate and Rhythm: Normal rate and regular rhythm.  ?   Heart sounds: No murmur heard. ?Pulmonary:  ?   Effort: No respiratory distress.  ?   Breath sounds: Normal breath sounds.  ?Musculoskeletal:  ?   Cervical back: Normal range of motion and neck supple.  ?Neurological:  ?   Mental Status: He is alert and oriented to person, place, and time.  ? ? ? ? ?Assessment & Plan:  ? ?There are no diagnoses linked to this encounter. ? ? ? ? ?I have discontinued Mitchell Parker lisinopril and traMADol. I have also changed his HYDROcodone-acetaminophen. Additionally, I am having him start on olmesartan. Lastly, I am having him maintain his multivitamin, calcium carbonate, vitamin B-12, acetaminophen, fluticasone, hydroxychloroquine, ferrous sulfate, atorvastatin, carvedilol, sildenafil, ondansetron, allopurinol, amLODipine, calcitRIOL, celecoxib, Colchicine, DULoxetine, furosemide, metFORMIN, pantoprazole, potassium chloride SA, tamsulosin, fluocinolone, carvedilol, dexamethasone, and levETIRAcetam. ? ?Allergies as of 05/10/2022   ? ?   Reactions  ? Ciprofloxacin Hives  ? Definity [perflutren Lipid Microsphere] Nausea And Vomiting  ? During ECHO with Definity injection pt became nauseated and vomited   ? Levofloxacin [levofloxacin] Hives  ? ?  ? ?  ?Medication List  ?  ? ?  ? Accurate as of May 10, 2022 10:20 AM. If you have any questions, ask your nurse or doctor.  ?  ?  ? ?  ? ?STOP taking these medications   ? ?lisinopril 20 MG tablet ?Commonly known as: ZESTRIL ?Stopped by: [name], MD ?  ?traMADol 50 MG tablet ?Commonly known as: ULTRAM ?Stopped by: [name], MD ?  ? ?  ? ?TAKE these medications   ? ?acetaminophen 500 MG tablet ?Commonly known as: TYLENOL ?Take 500 mg by mouth every 6 (six) hours as needed for mild pain. ?  ?allopurinol 300 MG tablet ?Commonly known as: ZYLOPRIM ?Take 1 tablet (300 mg total) by  mouth every morning. ?  ?amLODipine 5 MG tablet ?Commonly known as: NORVASC ?Take 1 tablet (5 mg total) by mouth daily. for blood pressure ?  ?atorvastatin 80 MG tablet ?Commonly known as: LIPITOR ?TAKE 1 TABLET BY MOUTH  DAILY AT 6 PM ?  ?calcitRIOL 0.25 MCG capsule ?Commonly known as: ROCALTROL ?Take 1 capsule (0.25 mcg total) by mouth daily with breakfast. ?  ?calcium carbonate 1500 (600 Ca) MG Tabs tablet ?Commonly known as: OSCAL ?Take 600 mg of elemental calcium by mouth 2 (two) times daily with a meal. ?  ?carvedilol 3.125 MG tablet ?Commonly known as: COREG ?Take 3.125 mg by mouth in the morning. ?What changed: Another medication with the same name was changed. Make sure you  understand how and when to take each. ?  ?carvedilol 6.25 MG tablet ?Commonly known as: COREG ?Take 0.5 tablets (3.125 mg total) by mouth 2 (two) times daily. ?What changed:  ?how much to take ?when to take this ?  ?celecoxib 200 MG capsule ?Commonly known as: CELEBREX ?TAKE 1 CAPSULE BY MOUTH DAILY  WITH FOOD ?  ?Colchicine 0.6 MG Caps ?Take 1 capsule by mouth daily. ?What changed:  ?when to take this ?reasons to take this ?  ?dexamethasone 4 MG tablet ?Commonly known as: DECADRON ?as directed. ?  ?DULoxetine 30 MG capsule ?Commonly known as: CYMBALTA ?Take 1 capsule (30 mg total) by mouth 2 (two) times daily. ?  ?ferrous sulfate 325 (65 FE) MG tablet ?Take 325 mg by mouth in the morning. ?  ?fluocinolone 0.01 % external solution ?Commonly known as: Synalar ?Place 3-5 drops in each ear canal twice daily ?  ?fluticasone 50 MCG/ACT nasal spray ?Commonly known as: FLONASE ?USE 2 SPRAYS IN BOTH  NOSTRILS DAILY AS NEEDED  FOR ALLERGIES OR RHINITIS ?  ?furosemide 40 MG tablet ?Commonly known as: LASIX ?Take 1 tablet (40 mg total) by mouth every morning. ?  ?HYDROcodone-acetaminophen 5-325 MG tablet ?Commonly known as: NORCO/VICODIN ?Take 1 tablet by mouth every 4 (four) hours as needed for moderate pain or severe pain. ?What changed: reasons  to take this ?Changed by: [name], MD ?  ?hydroxychloroquine 200 MG tablet ?Commonly known as: PLAQUENIL ?Take 1 tablet by mouth in the morning and at bedtime. ?  ?levETIRAcetam 500 MG tablet ?Commonly known as:

## 2022-05-12 ENCOUNTER — Telehealth: Payer: Self-pay | Admitting: Family Medicine

## 2022-05-12 ENCOUNTER — Other Ambulatory Visit: Payer: Self-pay | Admitting: Family Medicine

## 2022-05-12 DIAGNOSIS — E782 Mixed hyperlipidemia: Secondary | ICD-10-CM

## 2022-05-25 ENCOUNTER — Ambulatory Visit (INDEPENDENT_AMBULATORY_CARE_PROVIDER_SITE_OTHER): Payer: Medicare Other | Admitting: Family Medicine

## 2022-05-25 ENCOUNTER — Encounter: Payer: Self-pay | Admitting: Family Medicine

## 2022-05-25 VITALS — BP 171/63 | HR 52 | Temp 97.9°F | Ht 73.0 in | Wt 310.8 lb

## 2022-05-25 DIAGNOSIS — G4701 Insomnia due to medical condition: Secondary | ICD-10-CM

## 2022-05-25 DIAGNOSIS — I10 Essential (primary) hypertension: Secondary | ICD-10-CM | POA: Diagnosis not present

## 2022-05-25 DIAGNOSIS — Z9989 Dependence on other enabling machines and devices: Secondary | ICD-10-CM | POA: Diagnosis not present

## 2022-05-25 DIAGNOSIS — G4733 Obstructive sleep apnea (adult) (pediatric): Secondary | ICD-10-CM | POA: Diagnosis not present

## 2022-05-25 DIAGNOSIS — S065XAA Traumatic subdural hemorrhage with loss of consciousness status unknown, initial encounter: Secondary | ICD-10-CM

## 2022-05-25 MED ORDER — AMLODIPINE BESYLATE 10 MG PO TABS: 10.0000 mg | ORAL_TABLET | Freq: Every day | ORAL | 1 refills | Status: DC

## 2022-05-25 NOTE — Progress Notes (Signed)
Subjective:  Patient ID: Mitchell Maya.,  male    DOB: 03/22/1951  Age: 71 y.o.    CC: Follow-up and Hypertension   HPI Mitchell Shouse. presents for  follow-up of hypertension. Patient has no history of headache chest pain or shortness of breath or recent cough. Patient also denies symptoms of TIA such as numbness weakness lateralizing. Patient denies side effects from medication. States taking it regularly. sLEEPING ONLY 3 HOURS A NIGHT. This in spite of routine use of CPAP and use of tylenol PM.  History Mitchell Parker has a past medical history of Chronic diarrhea, CKD (chronic kidney disease) stage 3, GFR 30-59 ml/min (HCC), COPD (chronic obstructive pulmonary disease) ([place]), Dysrhythmia (02/2022), Essential hypertension, Gallstones, Gout, History of CHF (congestive heart failure), History of colonic polyps, History of DVT (deep vein thrombosis) (04/19/2012), Kidney stones, Lumbar disc disease, Neuropathy, Nonischemic cardiomyopathy ([place]) (2003), Obesity, Orthostatic hypotension, RA (rheumatoid arthritis) ([place]), Sleep apnea, Subarachnoid hemorrhage ([place]) (02/03/2022), Subdural hematoma ([place]) (02/03/2022), Syncope, vasovagal, and Type 2 diabetes mellitus ([place]).   Mitchell Parker has a past surgical history that includes Umbilical hernia repair; Pilonidal cyst excision (1997); Varicose vein surgery; Cholecystectomy (01/18/2012); Bariatric Surgery (01/11/2018); Eye surgery; and [facility] (Right, 05/04/2022).   His family history includes Diabetes in his brother and father; Heart disease in his father; Hypertension in his father and mother; Stroke in his mother.Mitchell Parker reports that Mitchell Parker quit smoking about 27 years ago. His smoking use included cigarettes. Mitchell Parker has never used smokeless tobacco. Mitchell Parker reports that Mitchell Parker does not drink alcohol and does not use drugs.  Current Outpatient Medications on File Prior to Visit  Medication Sig Dispense Refill   acetaminophen (TYLENOL) 500 MG tablet Take 500 mg by mouth every 6 (six)  hours as needed for mild pain.     allopurinol (ZYLOPRIM) 300 MG tablet Take 1 tablet (300 mg total) by mouth every morning. 90 tablet 2   atorvastatin (LIPITOR) 80 MG tablet TAKE 1 TABLET BY MOUTH  DAILY AT 6 PM 90 tablet 0   calcitRIOL (ROCALTROL) 0.25 MCG capsule Take 1 capsule (0.25 mcg total) by mouth daily with breakfast. 90 capsule 3   calcium carbonate (OSCAL) 1500 (600 Ca) MG TABS tablet Take 600 mg of elemental calcium by mouth 2 (two) times daily with a meal.     carvedilol (COREG) 3.125 MG tablet Take 3.125 mg by mouth in the morning.     carvedilol (COREG) 6.25 MG tablet Take 0.5 tablets (3.125 mg total) by mouth 2 (two) times daily. (Patient taking differently: Take 6.25 mg by mouth every evening.) 180 tablet 3   celecoxib (CELEBREX) 200 MG capsule TAKE 1 CAPSULE BY MOUTH DAILY  WITH FOOD 90 capsule 2   Colchicine 0.6 MG CAPS Take 1 capsule by mouth daily. (Patient taking differently: Take 1 capsule by mouth daily as needed (gout flares.).) 90 capsule 2   dexamethasone (DECADRON) 4 MG tablet as directed.     DULoxetine (CYMBALTA) 30 MG capsule Take 1 capsule (30 mg total) by mouth 2 (two) times daily. 180 capsule 2   ferrous sulfate 325 (65 FE) MG tablet Take 325 mg by mouth in the morning.     fluocinolone (SYNALAR) 0.01 % external solution Place 3-5 drops in each ear canal twice daily 60 mL 5   fluticasone (FLONASE) 50 MCG/ACT nasal spray USE 2 SPRAYS IN BOTH  NOSTRILS DAILY AS NEEDED  FOR ALLERGIES OR RHINITIS 48 g 2   furosemide (LASIX) 40 MG tablet  Take 1 tablet (40 mg total) by mouth every morning. 90 tablet 2   HYDROcodone-acetaminophen (NORCO/VICODIN) 5-325 MG tablet Take 1 tablet by mouth every 4 (four) hours as needed for moderate pain or severe pain. 50 tablet 0   hydroxychloroquine (PLAQUENIL) 200 MG tablet Take 1 tablet by mouth in the morning and at bedtime.     levETIRAcetam (KEPPRA) 500 MG tablet Take 1 tablet (500 mg total) by mouth 2 (two) times daily. 14 tablet 0    metFORMIN (GLUCOPHAGE-XR) 500 MG 24 hr tablet Take 1 tablet (500 mg total) by mouth daily with breakfast. 90 tablet 3   Multiple Vitamin (MULTIVITAMIN) capsule Take 1 capsule by mouth daily.     olmesartan (BENICAR) 40 MG tablet Take 1 tablet (40 mg total) by mouth daily. For blood pressure 90 tablet 1   ondansetron (ZOFRAN-ODT) 4 MG disintegrating tablet Take 1 tablet (4 mg total) by mouth every 6 (six) hours as needed for nausea. 30 tablet 0   pantoprazole (PROTONIX) 40 MG tablet Take 1 tablet (40 mg total) by mouth daily. 90 tablet 2   potassium chloride SA (KLOR-CON M) 20 MEQ tablet TAKE 1 TABLET BY MOUTH DAILY FOR POTASSIUM REPLACEMENT/  SUPPLEMENT 90 tablet 2   sildenafil (REVATIO) 20 MG tablet Take 2-5 pills at once, orally, with each sexual encounter 50 tablet 5   tamsulosin (FLOMAX) 0.4 MG CAPS capsule Take 1 capsule (0.4 mg total) by mouth at bedtime. 90 capsule 2   vitamin B-12 (CYANOCOBALAMIN) 500 MCG tablet Take 500 mcg by mouth daily.     No current facility-administered medications on file prior to visit.    ROS Review of Systems  Constitutional:  Negative for fever.  Respiratory:  Negative for shortness of breath.   Cardiovascular:  Negative for chest pain.  Musculoskeletal:  Negative for arthralgias.  Skin:  Negative for rash.  Psychiatric/Behavioral:  Positive for sleep disturbance (sleeping from 10:30 to 2AM . Can't get back to sleep. ngoing for 3 weeks.).    Objective:  BP (!) 171/63   Pulse (!) 52   Temp 97.9 F (36.6 C)   Ht 6\' 1"  (1.854 m)   Wt (!) 310 lb 12.8 oz (141 kg)   SpO2 97%   BMI 41.01 kg/m   BP Readings from Last 3 Encounters:  05/25/22 (!) 171/63  05/10/22 (!) 161/70  05/06/22 (!) 154/54    Wt Readings from Last 3 Encounters:  05/25/22 (!) 310 lb 12.8 oz (141 kg)  05/10/22 (!) 308 lb 3.2 oz (139.8 kg)  05/04/22 300 lb (136.1 kg)     Physical Exam Vitals reviewed.  Constitutional:      Appearance: Mitchell Parker is well-developed.  HENT:      Head: Normocephalic and atraumatic.     Right Ear: External ear normal.     Left Ear: External ear normal.     Mouth/Throat:     Pharynx: No oropharyngeal exudate or posterior oropharyngeal erythema.  Eyes:     Pupils: Pupils are equal, round, and reactive to light.  Cardiovascular:     Rate and Rhythm: Normal rate and regular rhythm.     Heart sounds: No murmur heard. Pulmonary:     Effort: No respiratory distress.     Breath sounds: Normal breath sounds.  Musculoskeletal:     Cervical back: Normal range of motion and neck supple.  Neurological:     Mental Status: Mitchell Parker is alert and oriented to person, place, and time.    Diabetic  Foot Exam - Simple   No data filed     Lab Results  Component Value Date   HGBA1C 6.7 (H) 02/03/2022   HGBA1C 6.0 08/19/2020   HGBA1C 5.7 05/09/2019    Assessment & Plan:   Mitchell Parker was seen today for follow-up and hypertension.  Diagnoses and all orders for this visit:  Essential hypertension -     amLODipine (NORVASC) 10 MG tablet; Take 1 tablet (10 mg total) by mouth daily. for blood pressure   I have changed Mitchell Rua Sr. "bill"'s amLODipine. I am also having him maintain his multivitamin, calcium carbonate, vitamin B-12, acetaminophen, fluticasone, hydroxychloroquine, ferrous sulfate, carvedilol, sildenafil, ondansetron, allopurinol, calcitRIOL, celecoxib, Colchicine, DULoxetine, furosemide, metFORMIN, pantoprazole, potassium chloride SA, tamsulosin, fluocinolone, carvedilol, dexamethasone, levETIRAcetam, HYDROcodone-acetaminophen, olmesartan, and atorvastatin.  Meds ordered this encounter  Medications   amLODipine (NORVASC) 10 MG tablet    Sig: Take 1 tablet (10 mg total) by mouth daily. for blood pressure    Dispense:  90 tablet    Refill:  1    Pt. Plans to try melatonin this week. Prefers no prescription at this time for sleep. Can continue Tylenol PM as well.   Follow-up: Return in about 1 week (around 06/01/2022).  [name], M.D.

## 2022-05-26 ENCOUNTER — Ambulatory Visit: Payer: Medicare Other | Admitting: Cardiology

## 2022-06-01 ENCOUNTER — Ambulatory Visit (INDEPENDENT_AMBULATORY_CARE_PROVIDER_SITE_OTHER): Payer: Medicare Other | Admitting: Family Medicine

## 2022-06-01 ENCOUNTER — Encounter: Payer: Self-pay | Admitting: Family Medicine

## 2022-06-01 ENCOUNTER — Ambulatory Visit: Payer: Medicare Other | Admitting: Physical Medicine & Rehabilitation

## 2022-06-01 VITALS — BP 169/61 | HR 47 | Temp 97.0°F | Ht 73.0 in | Wt 310.8 lb

## 2022-06-01 DIAGNOSIS — I10 Essential (primary) hypertension: Secondary | ICD-10-CM

## 2022-06-01 DIAGNOSIS — J449 Chronic obstructive pulmonary disease, unspecified: Secondary | ICD-10-CM

## 2022-06-01 DIAGNOSIS — E785 Hyperlipidemia, unspecified: Secondary | ICD-10-CM

## 2022-06-01 DIAGNOSIS — E1169 Type 2 diabetes mellitus with other specified complication: Secondary | ICD-10-CM | POA: Diagnosis not present

## 2022-06-01 MED ORDER — CLONIDINE HCL 0.1 MG PO TABS: 0.1000 mg | ORAL_TABLET | Freq: Two times a day (BID) | ORAL | 1 refills | Status: DC

## 2022-06-01 MED ORDER — NEOMYCIN-POLYMYXIN-HC 3.5-10000-1 OT SUSP: OTIC | 3 refills | Status: DC

## 2022-06-01 MED ORDER — HYDROXYCHLOROQUINE SULFATE 200 MG PO TABS: 200.0000 mg | ORAL_TABLET | Freq: Two times a day (BID) | ORAL | 3 refills | Status: DC

## 2022-06-01 NOTE — Progress Notes (Signed)
Subjective:  Patient ID: Mitchell Parker., male    DOB: Aug 29, 1951  Age: 71 y.o. MRN: [id number]  CC: Follow-up and Hypertension   HPI Mitchell HUMPHREYS Sr. presents for  presents for  follow-up of hypertension. Patient has no history of headache chest pain or shortness of breath or recent cough. Patient also denies symptoms of TIA such as focal numbness or weakness. Patient denies side effects from medication. States taking it regularly.   in for follow-up of elevated cholesterol. Doing well without complaints on current medication. Denies side effects of statin including myalgia and arthralgia and nausea. Currently no chest pain, shortness of breath or other cardiovascular related symptoms noted.      06/01/2022    1:40 PM 06/01/2022   11:52 AM 05/25/2022   12:58 PM  Depression screen PHQ 2/9  Decreased Interest 0 0 0  Down, Depressed, Hopeless 0 0 0  PHQ - 2 Score 0 0 0  Altered sleeping 2    Tired, decreased energy 1    Change in appetite 0    Feeling bad or failure about yourself  0    Trouble concentrating 1    Moving slowly or fidgety/restless 0    Suicidal thoughts 0    PHQ-9 Score 4    Difficult doing work/chores Somewhat difficult      History [place] has a past medical history of Chronic diarrhea, CKD (chronic kidney disease) stage 3, GFR 30-59 ml/min (HCC), COPD (chronic obstructive pulmonary disease) ([place]), Dysrhythmia (02/2022), Essential hypertension, Gallstones, Gout, History of CHF (congestive heart failure), History of colonic polyps, History of DVT (deep vein thrombosis) (04/19/2012), Kidney stones, Lumbar disc disease, Neuropathy, Nonischemic cardiomyopathy ([place]) (2003), Obesity, Orthostatic hypotension, RA (rheumatoid arthritis) ([place]), Sleep apnea, Subarachnoid hemorrhage ([place]) (02/03/2022), Subdural hematoma ([place]) (02/03/2022), Syncope, vasovagal, and Type 2 diabetes mellitus ([place]).   He has a past surgical history that includes Umbilical hernia repair; Pilonidal cyst  excision (1997); Varicose vein surgery; Cholecystectomy (01/18/2012); Bariatric Surgery (01/11/2018); Eye surgery; and [facility] (Right, 05/04/2022).   His family history includes Diabetes in his brother and father; Heart disease in his father; Hypertension in his father and mother; Stroke in his mother.He reports that he quit smoking about 27 years ago. His smoking use included cigarettes. He has never used smokeless tobacco. He reports that he does not drink alcohol and does not use drugs.    ROS Review of Systems  Constitutional:  Negative for fever.  Respiratory:  Negative for shortness of breath.   Cardiovascular:  Negative for chest pain.  Musculoskeletal:  Positive for arthralgias.  Skin:  Negative for rash.    Objective:  BP (!) 169/61   Pulse (!) 47   Temp (!) 97 F (36.1 C)   Ht 6\' 1"  (1.854 m)   Wt (!) 310 lb 12.8 oz (141 kg)   SpO2 97%   BMI 41.01 kg/m   BP Readings from Last 3 Encounters:  06/01/22 (!) 169/61  05/25/22 (!) 171/63  05/10/22 (!) 161/70    Wt Readings from Last 3 Encounters:  06/01/22 (!) 310 lb 12.8 oz (141 kg)  05/25/22 (!) 310 lb 12.8 oz (141 kg)  05/10/22 (!) 308 lb 3.2 oz (139.8 kg)     Physical Exam Vitals reviewed.  Constitutional:      Appearance: He is well-developed.  HENT:     Head: Normocephalic and atraumatic.     Right Ear: External ear normal.     Left Ear: External ear  normal.     Mouth/Throat:     Pharynx: No oropharyngeal exudate or posterior oropharyngeal erythema.  Eyes:     Pupils: Pupils are equal, round, and reactive to light.  Cardiovascular:     Rate and Rhythm: Normal rate and regular rhythm.     Heart sounds: No murmur heard. Pulmonary:     Effort: No respiratory distress.     Breath sounds: Normal breath sounds.  Musculoskeletal:     Cervical back: Normal range of motion and neck supple.  Neurological:     Mental Status: He is alert and oriented to person, place, and time.       Assessment & Plan:    Mohammed was seen today for follow-up and hypertension.  Diagnoses and all orders for this visit:  Primary hypertension  Chronic obstructive pulmonary disease, unspecified COPD type ([place])  Hyperlipidemia associated with type 2 diabetes mellitus ([place])  Other orders -     hydroxychloroquine (PLAQUENIL) 200 MG tablet; Take 1 tablet (200 mg total) by mouth in the morning and at bedtime. -     neomycin-polymyxin-hydrocortisone (CORTISPORIN) 3.5-10000-1 OTIC suspension; PLACE 4 DROPS INTO AFFECTED EAR TWICE DAILY FOR 10 DAYS -     cloNIDine (CATAPRES) 0.1 MG tablet; Take 1 tablet (0.1 mg total) by mouth 2 (two) times daily. For Blood Pressure       I have discontinued [name] "bill"'s dexamethasone. I have also changed his hydroxychloroquine. Additionally, I am having him start on cloNIDine. Lastly, I am having him maintain his multivitamin, calcium carbonate, vitamin B-12, acetaminophen, fluticasone, ferrous sulfate, carvedilol, sildenafil, ondansetron, allopurinol, calcitRIOL, celecoxib, Colchicine, DULoxetine, furosemide, metFORMIN, pantoprazole, potassium chloride SA, tamsulosin, fluocinolone, carvedilol, levETIRAcetam, HYDROcodone-acetaminophen, olmesartan, atorvastatin, amLODipine, and neomycin-polymyxin-hydrocortisone.  Allergies as of 06/01/2022       Reactions   Ciprofloxacin Hives   Definity [perflutren Lipid Microsphere] Nausea And Vomiting   During ECHO with Definity injection pt became nauseated and vomited    Levofloxacin [levofloxacin] Hives        Medication List        Accurate as of June 01, 2022 11:59 PM. If you have any questions, ask your nurse or doctor.          STOP taking these medications    dexamethasone 4 MG tablet Commonly known as: DECADRON Stopped by: [name], MD       TAKE these medications    acetaminophen 500 MG tablet Commonly known as: TYLENOL Take 500 mg by mouth every 6 (six) hours as needed for mild pain.    allopurinol 300 MG tablet Commonly known as: ZYLOPRIM Take 1 tablet (300 mg total) by mouth every morning.   amLODipine 10 MG tablet Commonly known as: NORVASC Take 1 tablet (10 mg total) by mouth daily. for blood pressure   atorvastatin 80 MG tablet Commonly known as: LIPITOR TAKE 1 TABLET BY MOUTH  DAILY AT 6 PM   calcitRIOL 0.25 MCG capsule Commonly known as: ROCALTROL Take 1 capsule (0.25 mcg total) by mouth daily with breakfast.   calcium carbonate 1500 (600 Ca) MG Tabs tablet Commonly known as: OSCAL Take 600 mg of elemental calcium by mouth 2 (two) times daily with a meal.   carvedilol 3.125 MG tablet Commonly known as: COREG Take 3.125 mg by mouth in the morning. What changed: Another medication with the same name was changed. Make sure you understand how and when to take each.   carvedilol 6.25 MG tablet Commonly known as: COREG Take  0.5 tablets (3.125 mg total) by mouth 2 (two) times daily. What changed:  how much to take when to take this   celecoxib 200 MG capsule Commonly known as: CELEBREX TAKE 1 CAPSULE BY MOUTH DAILY  WITH FOOD   cloNIDine 0.1 MG tablet Commonly known as: CATAPRES Take 1 tablet (0.1 mg total) by mouth 2 (two) times daily. For Blood Pressure Started by: [name], MD   Colchicine 0.6 MG Caps Take 1 capsule by mouth daily. What changed:  when to take this reasons to take this   DULoxetine 30 MG capsule Commonly known as: CYMBALTA Take 1 capsule (30 mg total) by mouth 2 (two) times daily.   ferrous sulfate 325 (65 FE) MG tablet Take 325 mg by mouth in the morning.   fluocinolone 0.01 % external solution Commonly known as: Synalar Place 3-5 drops in each ear canal twice daily   fluticasone 50 MCG/ACT nasal spray Commonly known as: FLONASE USE 2 SPRAYS IN BOTH  NOSTRILS DAILY AS NEEDED  FOR ALLERGIES OR RHINITIS   furosemide 40 MG tablet Commonly known as: LASIX Take 1 tablet (40 mg total) by mouth every morning.    HYDROcodone-acetaminophen 5-325 MG tablet Commonly known as: NORCO/VICODIN Take 1 tablet by mouth every 4 (four) hours as needed for moderate pain or severe pain.   hydroxychloroquine 200 MG tablet Commonly known as: PLAQUENIL Take 1 tablet (200 mg total) by mouth in the morning and at bedtime.   levETIRAcetam 500 MG tablet Commonly known as: KEPPRA Take 1 tablet (500 mg total) by mouth 2 (two) times daily.   metFORMIN 500 MG 24 hr tablet Commonly known as: GLUCOPHAGE-XR Take 1 tablet (500 mg total) by mouth daily with breakfast.   multivitamin capsule Take 1 capsule by mouth daily.   neomycin-polymyxin-hydrocortisone 3.5-10000-1 OTIC suspension Commonly known as: CORTISPORIN PLACE 4 DROPS INTO AFFECTED EAR TWICE DAILY FOR 10 DAYS Started by: [name], MD   olmesartan 40 MG tablet Commonly known as: Benicar Take 1 tablet (40 mg total) by mouth daily. For blood pressure   ondansetron 4 MG disintegrating tablet Commonly known as: ZOFRAN-ODT Take 1 tablet (4 mg total) by mouth every 6 (six) hours as needed for nausea.   pantoprazole 40 MG tablet Commonly known as: PROTONIX Take 1 tablet (40 mg total) by mouth daily.   potassium chloride SA 20 MEQ tablet Commonly known as: KLOR-CON M TAKE 1 TABLET BY MOUTH DAILY FOR POTASSIUM REPLACEMENT/  SUPPLEMENT   sildenafil 20 MG tablet Commonly known as: REVATIO Take 2-5 pills at once, orally, with each sexual encounter   tamsulosin 0.4 MG Caps capsule Commonly known as: FLOMAX Take 1 capsule (0.4 mg total) by mouth at bedtime.   vitamin B-12 500 MCG tablet Commonly known as: CYANOCOBALAMIN Take 500 mcg by mouth daily.         Follow-up: Return in about 6 weeks (around 07/13/2022).  [name], M.D.

## 2022-06-04 ENCOUNTER — Encounter: Payer: Self-pay | Admitting: Family Medicine

## 2022-06-22 ENCOUNTER — Other Ambulatory Visit: Payer: Self-pay | Admitting: Cardiology

## 2022-06-23 DIAGNOSIS — I6203 Nontraumatic chronic subdural hemorrhage: Secondary | ICD-10-CM | POA: Diagnosis not present

## 2022-06-23 DIAGNOSIS — I6200 Nontraumatic subdural hemorrhage, unspecified: Secondary | ICD-10-CM | POA: Diagnosis not present

## 2022-06-29 ENCOUNTER — Telehealth: Payer: Self-pay | Admitting: Family Medicine

## 2022-06-29 NOTE — Telephone Encounter (Signed)
I don't see a need for it.

## 2022-06-29 NOTE — Telephone Encounter (Signed)
Patient wife Mitchell Parker Informed

## 2022-08-02 ENCOUNTER — Ambulatory Visit (INDEPENDENT_AMBULATORY_CARE_PROVIDER_SITE_OTHER): Payer: Medicare Other | Admitting: Family Medicine

## 2022-08-02 ENCOUNTER — Encounter: Payer: Self-pay | Admitting: Family Medicine

## 2022-08-02 VITALS — BP 176/65 | HR 61 | Temp 97.4°F | Ht 73.0 in | Wt 312.2 lb

## 2022-08-02 DIAGNOSIS — N1832 Chronic kidney disease, stage 3b: Secondary | ICD-10-CM | POA: Diagnosis not present

## 2022-08-02 DIAGNOSIS — I509 Heart failure, unspecified: Secondary | ICD-10-CM | POA: Diagnosis not present

## 2022-08-02 DIAGNOSIS — H60331 Swimmer's ear, right ear: Secondary | ICD-10-CM | POA: Diagnosis not present

## 2022-08-02 DIAGNOSIS — I10 Essential (primary) hypertension: Secondary | ICD-10-CM

## 2022-08-02 DIAGNOSIS — R197 Diarrhea, unspecified: Secondary | ICD-10-CM

## 2022-08-02 DIAGNOSIS — E1122 Type 2 diabetes mellitus with diabetic chronic kidney disease: Secondary | ICD-10-CM

## 2022-08-02 MED ORDER — AMLODIPINE-OLMESARTAN 10-40 MG PO TABS: 1.0000 | ORAL_TABLET | Freq: Every day | ORAL | 3 refills | Status: DC

## 2022-08-02 MED ORDER — AMOXICILLIN-POT CLAVULANATE 875-125 MG PO TABS: 1.0000 | ORAL_TABLET | Freq: Two times a day (BID) | ORAL | 0 refills | Status: DC

## 2022-08-02 MED ORDER — CLONIDINE HCL 0.2 MG PO TABS: 0.2000 mg | ORAL_TABLET | Freq: Two times a day (BID) | ORAL | 3 refills | Status: DC

## 2022-08-02 MED ORDER — CARVEDILOL 6.25 MG PO TABS: 6.2500 mg | ORAL_TABLET | Freq: Every evening | ORAL | 3 refills | Status: DC

## 2022-08-02 NOTE — Progress Notes (Signed)
Subjective:  Patient ID: Mitchell Jarred., male    DOB: [birth year]  Age: 71 y.o. MRN: [id number]  CC: Follow-up and Hypertension   HPI Mitchell BELAIR Sr. presents for  follow-up of hypertension. Patient has no history of headache chest pain or shortness of breath or recent cough. Patient also denies symptoms of TIA such as focal numbness or weakness. Patient denies side effects from medication. States taking it regularly.  presents forFollow-up of diabetes. Patient denies symptoms such as polyuria, polydipsia, excessive hunger, nausea No significant hypoglycemic spells noted. Medications reviewed. Pt reports taking them regularly. Lab Results  Component Value Date   HGBA1C 6.7 (H) 02/03/2022   HGBA1C 6.0 08/19/2020   HGBA1C 5.7 05/09/2019      Diarrhea with fecal incontinence for several months.  Right ear infection. Painful. Onset was 3 days ago. Ear lobe swollen.    History Mitchell Parker has a past medical history of Chronic diarrhea, CKD (chronic kidney disease) stage 3, GFR 30-59 ml/min (HCC), COPD (chronic obstructive pulmonary disease) (HCC), Dysrhythmia (02/2022), Essential hypertension, Gallstones, Gout, History of CHF (congestive heart failure), History of colonic polyps, History of DVT (deep vein thrombosis) (04/19/2012), Kidney stones, Lumbar disc disease, Neuropathy, Nonischemic cardiomyopathy (HCC) (2003), Obesity, Orthostatic hypotension, RA (rheumatoid arthritis) (HCC), Sleep apnea, Subarachnoid hemorrhage (HCC) (02/03/2022), Subdural hematoma (HCC) (02/03/2022), Syncope, vasovagal, and Type 2 diabetes mellitus (HCC).   He has a past surgical history that includes Umbilical hernia repair; Pilonidal cyst excision (1997); Varicose vein surgery; Cholecystectomy (01/18/2012); Bariatric Surgery (01/11/2018); Eye surgery; and [facility] (Right, 05/04/2022).   His family history includes Diabetes in his brother and father; Heart disease in his father; Hypertension in his father and  mother; Stroke in his mother.He reports that he quit smoking about 27 years ago. His smoking use included cigarettes. He has never used smokeless tobacco. He reports that he does not drink alcohol and does not use drugs.  Current Outpatient Medications on File Prior to Visit  Medication Sig Dispense Refill   acetaminophen (TYLENOL) 500 MG tablet Take 500 mg by mouth every 6 (six) hours as needed for mild pain.     allopurinol (ZYLOPRIM) 300 MG tablet Take 1 tablet (300 mg total) by mouth every morning. 90 tablet 2   atorvastatin (LIPITOR) 80 MG tablet TAKE 1 TABLET BY MOUTH  DAILY AT 6 PM 90 tablet 0   calcitRIOL (ROCALTROL) 0.25 MCG capsule Take 1 capsule (0.25 mcg total) by mouth daily with breakfast. 90 capsule 3   calcium carbonate (OSCAL) 1500 (600 Ca) MG TABS tablet Take 600 mg of elemental calcium by mouth 2 (two) times daily with a meal.     carvedilol (COREG) 3.125 MG tablet Take 3.125 mg by mouth in the morning.     celecoxib (CELEBREX) 200 MG capsule TAKE 1 CAPSULE BY MOUTH DAILY  WITH FOOD 90 capsule 2   Colchicine 0.6 MG CAPS Take 1 capsule by mouth daily. (Patient taking differently: Take 1 capsule by mouth daily as needed (gout flares.).) 90 capsule 2   DULoxetine (CYMBALTA) 30 MG capsule Take 1 capsule (30 mg total) by mouth 2 (two) times daily. 180 capsule 2   ferrous sulfate 325 (65 FE) MG tablet Take 325 mg by mouth in the morning.     fluocinolone (SYNALAR) 0.01 % external solution Place 3-5 drops in each ear canal twice daily 60 mL 5   fluticasone (FLONASE) 50 MCG/ACT nasal spray USE 2 SPRAYS IN BOTH  NOSTRILS DAILY AS NEEDED  FOR ALLERGIES OR RHINITIS 48 g 2   furosemide (LASIX) 40 MG tablet Take 1 tablet (40 mg total) by mouth every morning. 90 tablet 2   hydroxychloroquine (PLAQUENIL) 200 MG tablet Take 1 tablet (200 mg total) by mouth in the morning and at bedtime. 180 tablet 3   levETIRAcetam (KEPPRA) 500 MG tablet Take 1 tablet (500 mg total) by mouth 2 (two) times daily.  14 tablet 0   Multiple Vitamin (MULTIVITAMIN) capsule Take 1 capsule by mouth daily.     neomycin-polymyxin-hydrocortisone (CORTISPORIN) 3.5-10000-1 OTIC suspension PLACE 4 DROPS INTO AFFECTED EAR TWICE DAILY FOR 10 DAYS 10 mL 3   ondansetron (ZOFRAN-ODT) 4 MG disintegrating tablet Take 1 tablet (4 mg total) by mouth every 6 (six) hours as needed for nausea. 30 tablet 0   pantoprazole (PROTONIX) 40 MG tablet Take 1 tablet (40 mg total) by mouth daily. 90 tablet 2   potassium chloride SA (KLOR-CON M) 20 MEQ tablet TAKE 1 TABLET BY MOUTH DAILY FOR POTASSIUM REPLACEMENT/  SUPPLEMENT 90 tablet 2   sildenafil (REVATIO) 20 MG tablet Take 2-5 pills at once, orally, with each sexual encounter 50 tablet 5   tamsulosin (FLOMAX) 0.4 MG CAPS capsule Take 1 capsule (0.4 mg total) by mouth at bedtime. 90 capsule 2   vitamin B-12 (CYANOCOBALAMIN) 500 MCG tablet Take 500 mcg by mouth daily.     No current facility-administered medications on file prior to visit.    ROS Review of Systems  Constitutional:  Negative for fever.  HENT:  Positive for ear pain.   Respiratory:  Negative for shortness of breath.   Cardiovascular:  Negative for chest pain.  Gastrointestinal:  Positive for diarrhea.  Musculoskeletal:  Negative for arthralgias.  Skin:  Negative for rash.    Objective:  BP (!) 176/65   Pulse 61   Temp (!) 97.4 F (36.3 C)   Ht 6\' 1"  (1.854 m)   Wt (!) 312 lb 3.2 oz (141.6 kg)   SpO2 96%   BMI 41.19 kg/m   BP Readings from Last 3 Encounters:  08/02/22 (!) 176/65  06/01/22 (!) 169/61  05/25/22 (!) 171/63    Wt Readings from Last 3 Encounters:  08/02/22 (!) 312 lb 3.2 oz (141.6 kg)  06/01/22 (!) 310 lb 12.8 oz (141 kg)  05/25/22 (!) 310 lb 12.8 oz (141 kg)     Physical Exam Vitals reviewed.  Constitutional:      Appearance: He is well-developed.  HENT:     Head: Normocephalic and atraumatic.     Comments: Right pinna swollen, red, canal occluded by edema    Left Ear: Tympanic  membrane, ear canal and external ear normal.     Mouth/Throat:     Pharynx: No oropharyngeal exudate or posterior oropharyngeal erythema.  Eyes:     Pupils: Pupils are equal, round, and reactive to light.  Cardiovascular:     Rate and Rhythm: Normal rate and regular rhythm.     Heart sounds: No murmur heard. Pulmonary:     Effort: No respiratory distress.     Breath sounds: Normal breath sounds.  Musculoskeletal:     Cervical back: Normal range of motion and neck supple.  Neurological:     Mental Status: He is alert and oriented to person, place, and time.       Assessment & Plan:   Mitchell Parker was seen today for follow-up and hypertension.  Diagnoses and all orders for this visit:  Type 2 diabetes mellitus with stage  3b chronic kidney disease, without long-term current use of insulin (HCC) -     carvedilol (COREG) 6.25 MG tablet; Take 1 tablet (6.25 mg total) by mouth every evening.  Chronic congestive heart failure, unspecified heart failure type (HCC) -     carvedilol (COREG) 6.25 MG tablet; Take 1 tablet (6.25 mg total) by mouth every evening.  Primary hypertension -     carvedilol (COREG) 6.25 MG tablet; Take 1 tablet (6.25 mg total) by mouth every evening.  Acute swimmer's ear of right side  Diarrhea, unspecified type  Other orders -     amLODipine-olmesartan (AZOR) 10-40 MG tablet; Take 1 tablet by mouth daily. -     cloNIDine (CATAPRES) 0.2 MG tablet; Take 1 tablet (0.2 mg total) by mouth 2 (two) times daily. For Blood Pressure -     amoxicillin-clavulanate (AUGMENTIN) 875-125 MG tablet; Take 1 tablet by mouth 2 (two) times daily. Take all of this medication   Allergies as of 08/02/2022       Reactions   Ciprofloxacin Hives   Definity [perflutren Lipid Microsphere] Nausea And Vomiting   During ECHO with Definity injection pt became nauseated and vomited    Levofloxacin [levofloxacin] Hives        Medication List        Accurate as of August 02, 2022  3:33  PM. If you have any questions, ask your nurse or doctor.          STOP taking these medications    amLODipine 10 MG tablet Commonly known as: NORVASC Stopped by: [name], MD   HYDROcodone-acetaminophen 5-325 MG tablet Commonly known as: NORCO/VICODIN Stopped by: [name], MD   metFORMIN 500 MG 24 hr tablet Commonly known as: GLUCOPHAGE-XR Stopped by: [name], MD   olmesartan 40 MG tablet Commonly known as: Benicar Stopped by: [name], MD       TAKE these medications    acetaminophen 500 MG tablet Commonly known as: TYLENOL Take 500 mg by mouth every 6 (six) hours as needed for mild pain.   allopurinol 300 MG tablet Commonly known as: ZYLOPRIM Take 1 tablet (300 mg total) by mouth every morning.   amLODipine-olmesartan 10-40 MG tablet Commonly known as: Azor Take 1 tablet by mouth daily. Started by: [name], MD   amoxicillin-clavulanate [id number] MG tablet Commonly known as: AUGMENTIN Take 1 tablet by mouth 2 (two) times daily. Take all of this medication Started by: [name], MD   atorvastatin 80 MG tablet Commonly known as: LIPITOR TAKE 1 TABLET BY MOUTH  DAILY AT 6 PM   calcitRIOL 0.25 MCG capsule Commonly known as: ROCALTROL Take 1 capsule (0.25 mcg total) by mouth daily with breakfast.   calcium carbonate 1500 (600 Ca) MG Tabs tablet Commonly known as: OSCAL Take 600 mg of elemental calcium by mouth 2 (two) times daily with a meal.   carvedilol 3.125 MG tablet Commonly known as: COREG Take 3.125 mg by mouth in the morning.   carvedilol 6.25 MG tablet Commonly known as: COREG Take 1 tablet (6.25 mg total) by mouth every evening.   celecoxib 200 MG capsule Commonly known as: CELEBREX TAKE 1 CAPSULE BY MOUTH DAILY  WITH FOOD   cloNIDine 0.2 MG tablet Commonly known as: CATAPRES Take 1 tablet (0.2 mg total) by mouth 2 (two) times daily. For Blood Pressure What changed:  medication strength how much to  take Changed by: [name], MD   Colchicine 0.6 MG Caps Take 1 capsule by  mouth daily. What changed:  when to take this reasons to take this   DULoxetine 30 MG capsule Commonly known as: CYMBALTA Take 1 capsule (30 mg total) by mouth 2 (two) times daily.   ferrous sulfate 325 (65 FE) MG tablet Take 325 mg by mouth in the morning.   fluocinolone 0.01 % external solution Commonly known as: Synalar Place 3-5 drops in each ear canal twice daily   fluticasone 50 MCG/ACT nasal spray Commonly known as: FLONASE USE 2 SPRAYS IN BOTH  NOSTRILS DAILY AS NEEDED  FOR ALLERGIES OR RHINITIS   furosemide 40 MG tablet Commonly known as: LASIX Take 1 tablet (40 mg total) by mouth every morning.   hydroxychloroquine 200 MG tablet Commonly known as: PLAQUENIL Take 1 tablet (200 mg total) by mouth in the morning and at bedtime.   levETIRAcetam 500 MG tablet Commonly known as: KEPPRA Take 1 tablet (500 mg total) by mouth 2 (two) times daily.   multivitamin capsule Take 1 capsule by mouth daily.   neomycin-polymyxin-hydrocortisone 3.5-10000-1 OTIC suspension Commonly known as: CORTISPORIN PLACE 4 DROPS INTO AFFECTED EAR TWICE DAILY FOR 10 DAYS   ondansetron 4 MG disintegrating tablet Commonly known as: ZOFRAN-ODT Take 1 tablet (4 mg total) by mouth every 6 (six) hours as needed for nausea.   pantoprazole 40 MG tablet Commonly known as: PROTONIX Take 1 tablet (40 mg total) by mouth daily.   potassium chloride SA 20 MEQ tablet Commonly known as: KLOR-CON M TAKE 1 TABLET BY MOUTH DAILY FOR POTASSIUM REPLACEMENT/  SUPPLEMENT   sildenafil 20 MG tablet Commonly known as: REVATIO Take 2-5 pills at once, orally, with each sexual encounter   tamsulosin 0.4 MG Caps capsule Commonly known as: FLOMAX Take 1 capsule (0.4 mg total) by mouth at bedtime.   vitamin B-12 500 MCG tablet Commonly known as: CYANOCOBALAMIN Take 500 mcg by mouth daily.        Meds ordered this encounter   Medications   amLODipine-olmesartan (AZOR) 10-40 MG tablet    Sig: Take 1 tablet by mouth daily.    Dispense:  90 tablet    Refill:  3   cloNIDine (CATAPRES) 0.2 MG tablet    Sig: Take 1 tablet (0.2 mg total) by mouth 2 (two) times daily. For Blood Pressure    Dispense:  180 tablet    Refill:  3   carvedilol (COREG) 6.25 MG tablet    Sig: Take 1 tablet (6.25 mg total) by mouth every evening.    Dispense:  90 tablet    Refill:  3    Requesting 1 year supply   amoxicillin-clavulanate (AUGMENTIN) 875-125 MG tablet    Sig: Take 1 tablet by mouth 2 (two) times daily. Take all of this medication    Dispense:  20 tablet    Refill:  0    DCed the metformin to help with diarrhea.   Follow-up: Return in about 1 month (around 09/02/2022) for hypertension, diabetes, bradycardia, diarrhea.  [name], M.D.

## 2022-09-07 DIAGNOSIS — Z8679 Personal history of other diseases of the circulatory system: Secondary | ICD-10-CM | POA: Diagnosis not present

## 2022-09-07 DIAGNOSIS — I6203 Nontraumatic chronic subdural hemorrhage: Secondary | ICD-10-CM | POA: Diagnosis not present

## 2022-09-07 DIAGNOSIS — Z9181 History of falling: Secondary | ICD-10-CM | POA: Diagnosis not present

## 2022-09-10 ENCOUNTER — Other Ambulatory Visit: Payer: Self-pay | Admitting: Family Medicine

## 2022-09-10 DIAGNOSIS — E782 Mixed hyperlipidemia: Secondary | ICD-10-CM

## 2022-09-12 ENCOUNTER — Ambulatory Visit: Payer: Medicare Other | Admitting: Family Medicine

## 2022-09-13 ENCOUNTER — Encounter: Payer: Self-pay | Admitting: Family Medicine

## 2022-09-26 ENCOUNTER — Ambulatory Visit: Payer: Medicare Other | Admitting: Family Medicine

## 2022-09-28 ENCOUNTER — Ambulatory Visit (INDEPENDENT_AMBULATORY_CARE_PROVIDER_SITE_OTHER): Payer: Medicare Other | Admitting: Family Medicine

## 2022-09-28 ENCOUNTER — Encounter: Payer: Self-pay | Admitting: Family Medicine

## 2022-09-28 VITALS — BP 139/51 | HR 72 | Temp 97.3°F | Ht 73.0 in | Wt 322.8 lb

## 2022-09-28 DIAGNOSIS — E1169 Type 2 diabetes mellitus with other specified complication: Secondary | ICD-10-CM

## 2022-09-28 DIAGNOSIS — R197 Diarrhea, unspecified: Secondary | ICD-10-CM | POA: Diagnosis not present

## 2022-09-28 DIAGNOSIS — I10 Essential (primary) hypertension: Secondary | ICD-10-CM | POA: Diagnosis not present

## 2022-09-28 DIAGNOSIS — E1122 Type 2 diabetes mellitus with diabetic chronic kidney disease: Secondary | ICD-10-CM | POA: Diagnosis not present

## 2022-09-28 DIAGNOSIS — N1832 Chronic kidney disease, stage 3b: Secondary | ICD-10-CM

## 2022-09-28 DIAGNOSIS — Z23 Encounter for immunization: Secondary | ICD-10-CM | POA: Diagnosis not present

## 2022-09-28 DIAGNOSIS — E785 Hyperlipidemia, unspecified: Secondary | ICD-10-CM | POA: Diagnosis not present

## 2022-09-28 LAB — BAYER DCA HB A1C WAIVED: HB A1C (BAYER DCA - WAIVED): 8.1 % — ABNORMAL HIGH (ref 4.8–5.6)

## 2022-09-28 MED ORDER — DAPAGLIFLOZIN PROPANEDIOL 10 MG PO TABS
10.0000 mg | ORAL_TABLET | Freq: Every day | ORAL | 3 refills | Status: DC
Start: 2022-09-28 — End: 2022-12-30

## 2022-09-28 NOTE — Progress Notes (Signed)
Chief Complaint  Patient presents with   Medical Management of Chronic Issues    HPI  Patient presents today for continued diarrhea X 4 yesterday. Watery  presents forFollow-up of diabetes. Patient checks blood sugar at home.   120-130 fasting Patient denies symptoms such as polyuria, polydipsia, excessive hunger, nausea No significant hypoglycemic spells noted. Medications reviewed. Pt reports taking them regularly without complication/adverse reaction being reported today.  Lab Results  Component Value Date   HGBA1C 6.7 (H) 02/03/2022   HGBA1C 6.0 08/19/2020   HGBA1C 5.7 05/09/2019      PMH: Smoking status noted ROS: Review of Systems  Constitutional:  Negative for fever.  Respiratory:  Negative for shortness of breath.   Cardiovascular:  Negative for chest pain.  Musculoskeletal:  Negative for arthralgias.  Skin:  Negative for rash.   Objective: BP (!) 139/51   Pulse 72   Temp (!) 97.3 F (36.3 C)   Ht 6' 1"  (1.854 m)   Wt (!) 322 lb 12.8 oz (146.4 kg)   SpO2 97%   BMI 42.59 kg/m  Gen: NAD, alert, cooperative with exam HEENT: NCAT, EOMI, PERRL CV: RRR, good S1/S2, no murmur Resp: CTABL, no wheezes, non-labored Abd: SNTND, BS present, no guarding or organomegaly Ext: No edema, warm Neuro: Alert and oriented, No gross deficits  Assessment and plan:  1. Primary hypertension   2. Type 2 diabetes mellitus with stage 3b chronic kidney disease, without long-term current use of insulin ([place])   3. Hyperlipidemia associated with type 2 diabetes mellitus ([place])   4. Diarrhea, unspecified type     Meds ordered this encounter  Medications   dapagliflozin propanediol (FARXIGA) 10 MG TABS tablet    Sig: Take 1 tablet (10 mg total) by mouth daily before breakfast.    Dispense:  90 tablet    Refill:  3    Orders Placed This Encounter  Procedures   Bayer DCA Hb A1c Waived   CBC with Differential/Platelet   CMP14+EGFR    Order Specific Question:   Has the patient  fasted?    Answer:   Yes    Order Specific Question:   Release to patient    Answer:   Immediate   Lipid panel    Order Specific Question:   Has the patient fasted?    Answer:   Yes    Order Specific Question:   Release to patient    Answer:   Immediate   Microalbumin / creatinine urine ratio   Ambulatory referral to Gastroenterology    Referral Priority:   Routine    Referral Type:   Consultation    Referral Reason:   Specialty Services Required    Number of Visits Requested:   1    Follow up 3 mos  [name], MD

## 2022-09-29 LAB — CMP14+EGFR
ALT: 35 IU/L (ref 0–44)
AST: 34 IU/L (ref 0–40)
Albumin/Globulin Ratio: 1.7 (ref 1.2–2.2)
Albumin: 4.1 g/dL (ref 3.9–4.9)
Alkaline Phosphatase: 67 IU/L (ref 44–121)
BUN/Creatinine Ratio: 21 (ref 10–24)
BUN: 29 mg/dL — ABNORMAL HIGH (ref 8–27)
Bilirubin Total: 0.5 mg/dL (ref 0.0–1.2)
CO2: 23 mmol/L (ref 20–29)
Calcium: 9 mg/dL (ref 8.6–10.2)
Chloride: 104 mmol/L (ref 96–106)
Creatinine, Ser: 1.39 mg/dL — ABNORMAL HIGH (ref 0.76–1.27)
Globulin, Total: 2.4 g/dL (ref 1.5–4.5)
Glucose: 240 mg/dL — ABNORMAL HIGH (ref 70–99)
Potassium: 5 mmol/L (ref 3.5–5.2)
Sodium: 141 mmol/L (ref 134–144)
Total Protein: 6.5 g/dL (ref 6.0–8.5)
eGFR: 55 mL/min/{1.73_m2} — ABNORMAL LOW (ref >59)

## 2022-09-29 LAB — CBC WITH DIFFERENTIAL/PLATELET
Basophils Absolute: 0 10*3/uL (ref 0.0–0.2)
Basos: 1 %
EOS (ABSOLUTE): 0.3 10*3/uL (ref 0.0–0.4)
Eos: 7 %
Hematocrit: 36.9 % — ABNORMAL LOW (ref 37.5–51.0)
Hemoglobin: 12.6 g/dL — ABNORMAL LOW (ref 13.0–17.7)
Immature Grans (Abs): 0 10*3/uL (ref 0.0–0.1)
Immature Granulocytes: 0 %
Lymphocytes Absolute: 0.9 10*3/uL (ref 0.7–3.1)
Lymphs: 22 %
MCH: 32.3 pg (ref 26.6–33.0)
MCHC: 34.1 g/dL (ref 31.5–35.7)
MCV: 95 fL (ref 79–97)
Monocytes Absolute: 0.4 10*3/uL (ref 0.1–0.9)
Monocytes: 11 %
Neutrophils Absolute: 2.4 10*3/uL (ref 1.4–7.0)
Neutrophils: 59 %
Platelets: 137 10*3/uL — ABNORMAL LOW (ref 150–450)
RBC: 3.9 x10E6/uL — ABNORMAL LOW (ref 4.14–5.80)
RDW: 12.1 % (ref 11.6–15.4)
WBC: 4 10*3/uL (ref 3.4–10.8)

## 2022-09-29 LAB — LIPID PANEL
Chol/HDL Ratio: 3.3 ratio (ref 0.0–5.0)
Cholesterol, Total: 110 mg/dL (ref 100–199)
HDL: 33 mg/dL — ABNORMAL LOW (ref >39)
LDL Chol Calc (NIH): 51 mg/dL (ref 0–99)
Triglycerides: 152 mg/dL — ABNORMAL HIGH (ref 0–149)
VLDL Cholesterol Cal: 26 mg/dL (ref 5–40)

## 2022-10-05 DIAGNOSIS — M1612 Unilateral primary osteoarthritis, left hip: Secondary | ICD-10-CM | POA: Diagnosis not present

## 2022-10-06 ENCOUNTER — Ambulatory Visit (INDEPENDENT_AMBULATORY_CARE_PROVIDER_SITE_OTHER): Payer: Medicare Other

## 2022-10-06 DIAGNOSIS — Z0000 Encounter for general adult medical examination without abnormal findings: Secondary | ICD-10-CM | POA: Diagnosis not present

## 2022-10-06 NOTE — Progress Notes (Signed)
MEDICARE ANNUAL WELLNESS VISIT  10/06/2022  Telephone Visit Disclaimer This Medicare AWV was conducted by telephone due to national recommendations for restrictions regarding the COVID-19 Pandemic (e.g. social distancing).  I verified, using two identifiers, that I am speaking with Mitchell Parker or their authorized healthcare agent. I discussed the limitations, risks, security, and privacy concerns of performing an evaluation and management service by telephone and the potential availability of an in-person appointment in the future. The patient expressed understanding and agreed to proceed.  Location of Patient: Home Location of Provider (nurse):  [facility]  Subjective:    Mitchell Bays. is a 71 y.o. male patient of Stacks, Cletus Gash, MD who had a Medicare Annual Wellness Visit today via telephone. Mitchell Parker resides in nearby [place] with his wife of 50 years. They have 2 children who both live close by. He is retired from the [place] [employer] but works part time for [employer]. He does not have a regular exercise routine but does a lot of walking while working. He gets most of his care through the [place]  Patient Care Team: [name], MD as PCP - General (Family Medicine) Satira Sark, MD as PCP - Cardiology (Cardiology)     10/06/2022   10:49 AM 05/04/2022    5:00 PM 05/04/2022   12:26 PM 02/10/2022    5:19 PM 02/03/2022    9:00 PM 02/03/2022    4:48 PM 10/05/2021   11:00 AM  Advanced Directives  Does Patient Have a Medical Advance Directive? Yes Yes Yes Yes No No Yes  Type of Advance Directive Living will Healthcare Power of [place];Living will  Does patient want to make changes to medical advance directive?  No - Patient declined No - Patient declined      Copy of [place] in Chart?  Yes - validated most recent copy scanned in chart (See row information) Yes -  validated most recent copy scanned in chart (See row information)    No - copy requested  Would patient like information on creating a medical advance directive?     No - Patient declined      Hospital Utilization Over the Past 12 Months: # of hospitalizations or ER visits: 0 # of surgeries: 0  Review of Systems    Patient reports that his overall health is unchanged compared to last year.  History obtained from chart review  Patient Reported Readings (BP, Pulse, CBG, Weight, etc) none  Pain Assessment Pain : 0-10 Pain Type: Chronic pain Pain Location: Hip Pain Orientation: Left Pain Onset: More than a month ago Pain Frequency: Constant Pain Relieving Factors: Takes Tylenol and shots  Pain Relieving Factors: Takes Tylenol and shots  Current Medications & Allergies (verified) Allergies as of 10/06/2022       Reactions   Ciprofloxacin Hives   Definity [perflutren Lipid Microsphere] Nausea And Vomiting   During ECHO with Definity injection pt became nauseated and vomited    Levofloxacin [levofloxacin] Hives        Medication List        Accurate as of October 06, 2022 10:55 AM. If you have any questions, ask your nurse or doctor.          acetaminophen 500 MG tablet Commonly known as: TYLENOL Take 500 mg by mouth every 6 (six) hours as needed for mild pain.   allopurinol 300  MG tablet Commonly known as: ZYLOPRIM Take 1 tablet (300 mg total) by mouth every morning.   amLODipine-olmesartan 10-40 MG tablet Commonly known as: Azor Take 1 tablet by mouth daily.   atorvastatin 80 MG tablet Commonly known as: LIPITOR TAKE 1 TABLET BY MOUTH DAILY AT  6 PM   calcitRIOL 0.25 MCG capsule Commonly known as: ROCALTROL Take 1 capsule (0.25 mcg total) by mouth daily with breakfast.   calcium carbonate 1500 (600 Ca) MG Tabs tablet Commonly known as: OSCAL Take 600 mg of elemental calcium by mouth 2 (two) times daily with a meal.   carvedilol 3.125 MG  tablet Commonly known as: COREG Take 3.125 mg by mouth in the morning.   carvedilol 6.25 MG tablet Commonly known as: COREG Take 1 tablet (6.25 mg total) by mouth every evening.   celecoxib 200 MG capsule Commonly known as: CELEBREX TAKE 1 CAPSULE BY MOUTH DAILY  WITH FOOD   cloNIDine 0.2 MG tablet Commonly known as: CATAPRES Take 1 tablet (0.2 mg total) by mouth 2 (two) times daily. For Blood Pressure   Colchicine 0.6 MG Caps Take 1 capsule by mouth daily. What changed:  when to take this reasons to take this   dapagliflozin propanediol 10 MG Tabs tablet Commonly known as: Farxiga Take 1 tablet (10 mg total) by mouth daily before breakfast.   DULoxetine 30 MG capsule Commonly known as: CYMBALTA Take 1 capsule (30 mg total) by mouth 2 (two) times daily.   ferrous sulfate 325 (65 FE) MG tablet Take 325 mg by mouth in the morning.   fluocinolone 0.01 % external solution Commonly known as: Synalar Place 3-5 drops in each ear canal twice daily   fluticasone 50 MCG/ACT nasal spray Commonly known as: FLONASE USE 2 SPRAYS IN BOTH  NOSTRILS DAILY AS NEEDED  FOR ALLERGIES OR RHINITIS   furosemide 40 MG tablet Commonly known as: LASIX Take 1 tablet (40 mg total) by mouth every morning.   hydroxychloroquine 200 MG tablet Commonly known as: PLAQUENIL Take 1 tablet (200 mg total) by mouth in the morning and at bedtime.   multivitamin capsule Take 1 capsule by mouth daily.   neomycin-polymyxin-hydrocortisone 3.5-10000-1 OTIC suspension Commonly known as: CORTISPORIN PLACE 4 DROPS INTO AFFECTED EAR TWICE DAILY FOR 10 DAYS   pantoprazole 40 MG tablet Commonly known as: PROTONIX Take 1 tablet (40 mg total) by mouth daily.   potassium chloride SA 20 MEQ tablet Commonly known as: KLOR-CON M TAKE 1 TABLET BY MOUTH DAILY FOR POTASSIUM REPLACEMENT/  SUPPLEMENT   sildenafil 20 MG tablet Commonly known as: REVATIO Take 2-5 pills at once, orally, with each sexual encounter    tamsulosin 0.4 MG Caps capsule Commonly known as: FLOMAX Take 1 capsule (0.4 mg total) by mouth at bedtime.   vitamin B-12 500 MCG tablet Commonly known as: CYANOCOBALAMIN Take 500 mcg by mouth daily.        History (reviewed): Past Medical History:  Diagnosis Date   Chronic diarrhea    CKD (chronic kidney disease) stage 3, GFR 30-59 ml/min (HCC)    COPD (chronic obstructive pulmonary disease) ([place])    Dysrhythmia 02/2022   paroxsymal SVT & aflutter 02/2022 XioXT   Essential hypertension    Gallstones    Gout    History of CHF (congestive heart failure)    History of colonic polyps    History of DVT (deep vein thrombosis) 04/19/2012   BLE DVT 04/09/12   Kidney stones    Lumbar disc disease  Neuropathy    Nonischemic cardiomyopathy ([place]) 2003   Minor coronary atherosclerosis at cardiac catheterization 2003, LVEF initially 20% with normalization   Obesity    Orthostatic hypotension    RA (rheumatoid arthritis) (HCC)    Sleep apnea    Subarachnoid hemorrhage ([place]) 02/03/2022   Traumatic   Subdural hematoma ([place]) 02/03/2022   Traumatic   Syncope, vasovagal    Type 2 diabetes mellitus [facility])    Past Surgical History:  Procedure Laterality Date   BARIATRIC SURGERY  01/11/2018   BURR HOLE Right 05/04/2022   Procedure: BURR HOLES FOR Subdural Hematoma;  Surgeon: Vallarie Mare, MD;  Location: [place];  Service: Neurosurgery;  Laterality: Right;   CHOLECYSTECTOMY  01/18/2012   Procedure: LAPAROSCOPIC CHOLECYSTECTOMY WITH INTRAOPERATIVE CHOLANGIOGRAM;  Surgeon: Joyice Faster. Cornett, MD;  Location: [facility];  Service: General;  Laterality: N/A;   EYE SURGERY     Film over retina was removed.   PILONIDAL CYST EXCISION  0000000   UMBILICAL HERNIA REPAIR     VARICOSE VEIN SURGERY     left leg   Family History  Problem Relation Age of Onset   Hypertension Mother    Stroke Mother    Diabetes Father    Hypertension Father    Heart disease Father    Diabetes Brother    Colon  cancer Neg Hx    Social History   Socioeconomic History   Marital status: Married    Spouse name: Mitchell Schmid   Number of children: 2   Years of education: Not on file   Highest education level: Some college, no degree  Occupational History   Occupation: retired  Tobacco Use   Smoking status: Former    Types: Cigarettes    Quit date: 12/26/1994    Years since quitting: 27.7   Smokeless tobacco: Never  Vaping Use   Vaping Use: Never used  Substance and Sexual Activity   Alcohol use: No   Drug use: No   Sexual activity: Not Currently  Other Topics Concern   Not on file  Social History Narrative   Lives at home with wife, and has two adult children. Still ambulatory, no cane or walker.    Works 30 hours per week at [employer].   Has VA benefits - goes to [place] once or twice a year for vision, hearing, screenings, vaccines, etc.   Social Determinants of Health   Financial Resource Strain: Low Risk  (10/05/2021)   Overall Financial Resource Strain (CARDIA)    Difficulty of Paying Living Expenses: Not hard at all  Food Insecurity: No Food Insecurity (10/05/2021)   Hunger Vital Sign    Worried About Running Out of Food in the Last Year: Never true    Ran Out of Food in the Last Year: Never true  Transportation Needs: No Transportation Needs (10/05/2021)   PRAPARE - Hydrologist (Medical): No    Lack of Transportation (Non-Medical): No  Physical Activity: Sufficiently Active (10/05/2021)   Exercise Vital Sign    Days of Exercise per Week: 5 days    Minutes of Exercise per Session: 60 min  Stress: No Stress Concern Present (10/05/2021)   [place]    Feeling of Stress : Not at all  Social Connections: Moderately Isolated (10/05/2021)   Social Connection and Isolation Panel [NHANES]    Frequency of Communication with Friends and Family: Once a week    Frequency of Social  Gatherings with  Friends and Family: Once a week    Attends Religious Services: Never    Marine scientist or Organizations: Yes    Attends Music therapist: More than 4 times per year    Marital Status: Married    Activities of Daily Living    10/06/2022   10:49 AM 05/04/2022    5:00 PM  In your present state of health, do you have any difficulty performing the following activities:  Hearing? 1 1  Comment  wears bilateral hearing aids  Vision? 0 0  Difficulty concentrating or making decisions? 0 0  Walking or climbing stairs? 0 0  Dressing or bathing? 0 0  Doing errands, shopping? 0 0  Preparing Food and eating ? N   Using the Toilet? N   In the past six months, have you accidently leaked urine? N   Do you have problems with loss of bowel control? N   Managing your Medications? N   Managing your Finances? N   Housekeeping or managing your Housekeeping? N     Patient Education/ Literacy How often do you need to have someone help you when you read instructions, pamphlets, or other written materials from your doctor or pharmacy?: 1 - Never What is the last grade level you completed in school?: 12th grade  Exercise Current Exercise Habits: The patient does not participate in regular exercise at present, Exercise limited by: orthopedic condition(s)  Diet Patient reports consuming 2 meals a day and 1 snack(s) a day Patient reports that his primary diet is: Diabetic Patient reports that she does have regular access to food.   Depression Screen    10/06/2022   10:49 AM 09/28/2022    8:59 AM 09/28/2022    8:47 AM 08/02/2022   10:02 AM 08/02/2022    9:55 AM 06/01/2022    1:40 PM 06/01/2022   11:52 AM  PHQ 2/9 Scores  PHQ - 2 Score 0 0 0 0 0 0 0  PHQ- 9 Score    2  4      Fall Risk    10/06/2022   10:49 AM 09/28/2022    8:47 AM 08/02/2022   10:02 AM 08/02/2022    9:55 AM 06/01/2022    1:40 PM  [place] in the past year? 1 1 1  0 1  Number falls in past yr: 0 1 1  1    Injury with Fall? 1 1 1  1   Risk for fall due to : History of fall(s) History of fall(s) History of fall(s)  History of fall(s)  Follow up Education provided Falls evaluation completed Falls evaluation completed  Falls evaluation completed     Objective:  Mitchell Rua Sr. seemed alert and oriented and he participated appropriately during our telephone visit.  Blood Pressure Weight BMI  BP Readings from Last 3 Encounters:  09/28/22 (!) 139/51  08/02/22 (!) 176/65  06/01/22 (!) 169/61   Wt Readings from Last 3 Encounters:  09/28/22 (!) 322 lb 12.8 oz (146.4 kg)  08/02/22 (!) 312 lb 3.2 oz (141.6 kg)  06/01/22 (!) 310 lb 12.8 oz (141 kg)   BMI Readings from Last 1 Encounters:  09/28/22 42.59 kg/m    *Unable to obtain current vital signs, weight, and BMI due to telephone visit type  Hearing/Vision  [place] did not seem to have difficulty with hearing/understanding during the telephone conversation Reports that he has had a formal eye exam by an eye  care professional within the past year Reports that he has not had a formal hearing evaluation within the past year *Unable to fully assess hearing and vision during telephone visit type  Cognitive Function:    10/06/2022   10:50 AM 04/30/2019    2:41 PM  6CIT Screen  What Year? 0 points 0 points  What month? 0 points 0 points  What time? 0 points 0 points  Count back from 20 0 points 0 points  Months in reverse 0 points 0 points  Repeat phrase 0 points 0 points  Total Score 0 points 0 points   (Normal:0-7, Significant for Dysfunction: >8)  Normal Cognitive Function Screening: Yes   Immunization & Health Maintenance Record Immunization History  Administered Date(s) Administered   Fluad Quad(high Dose 65+) 10/10/2019, 09/28/2022   Influenza Split 10/17/2011, 10/09/2012   Influenza, High Dose Seasonal PF 09/27/2018, 10/07/2019, 10/08/2019, 09/10/2020, 10/18/2021   Influenza,inj,Quad PF,6+ Mos 09/21/2017    Influenza-Unspecified 09/27/2018   Moderna Covid-19 Vaccine Bivalent Booster 53yrs & up 10/18/2021   Moderna Sars-Covid-2 Vaccination 02/06/2020, 03/05/2020, 10/20/2020   Pneumococcal Conjugate-13 06/17/2016, 09/27/2018   Pneumococcal Polysaccharide-23 12/23/2010, 05/08/2013   Tdap 05/08/2013   Zoster Recombinat (Shingrix) 10/20/2020, 03/23/2022   Zoster, Live 05/08/2013    Health Maintenance  Topic Date Due   FOOT EXAM  Never done   Diabetic kidney evaluation - Urine ACR  Never done   OPHTHALMOLOGY EXAM  10/06/2022 (Originally 06/21/1961)   COVID-19 Vaccine (5 - Moderna risk series) 10/14/2022 (Originally 12/13/2021)   Pneumonia Vaccine 38+ Years old (3 - PPSV23 or PCV20) 03/24/2023 (Originally 09/28/2019)   COLONOSCOPY (Pts 45-56yrs Insurance coverage will need to be confirmed)  12/26/2022   HEMOGLOBIN A1C  03/30/2023   Diabetic kidney evaluation - GFR measurement  09/29/2023   TETANUS/TDAP  02/04/2032   INFLUENZA VACCINE  Completed   Hepatitis C Screening  Completed   Zoster Vaccines- Shingrix  Completed   HPV VACCINES  Aged Out       Assessment  This is a routine wellness examination for [employer] ParkerMarland Kitchen  Health Maintenance: Due or Overdue Health Maintenance Due  Topic Date Due   FOOT EXAM  Never done   Diabetic kidney evaluation - Urine ACR  Never done    Mitchell Rua Sr. does not need a referral for Community Assistance: Care Management:   no Social Work:    no Prescription Assistance:  no Nutrition/Diabetes Education:  no   Plan:  Personalized Goals  Goals Addressed             This Visit's Progress    DIET - EAT MORE FRUITS AND VEGETABLES       Exercise 150 min/wk Moderate Activity         Personalized Health Maintenance & Screening Recommendations  Pneumococcal vaccine   Lung Cancer Screening Recommended: no (Low Dose CT Chest recommended if Age 72-80 years, 30 pack-year currently smoking OR have quit w/in past 15 years) Hepatitis C Screening  recommended: Done HIV Screening recommended: no  Advanced Directives: Written information was not prepared per patient's request.  Referrals & Orders No orders of the defined types were placed in this encounter.   Follow-up Plan Follow-up with [name], MD as planned Recently seen by PCP in office    I have personally reviewed and noted the following in the patient's chart:   Medical and social history Use of alcohol, tobacco or illicit drugs  Current medications and supplements Functional ability and status  Nutritional status Physical activity Advanced directives List of other physicians Hospitalizations, surgeries, and ER visits in previous 12 months Vitals Screenings to include cognitive, depression, and falls Referrals and appointments  In addition, I have reviewed and discussed with Mitchell Rua Sr. certain preventive protocols, quality metrics, and best practice recommendations. A written personalized care plan for preventive services as well as general preventive health recommendations is available and can be mailed to the patient at his request.      Rolena Infante LPN [id number]

## 2022-10-09 NOTE — Progress Notes (Deleted)
Cardiology Office Note  Date: 10/09/2022   ID: Mitchell Parker., DOB 02/12/1951, MRN [id number]  PCP:  [name], MD  Cardiologist:  [name], MD Electrophysiologist:  None   No chief complaint on file.   History of Present Illness: Mitchell Parker. is a 71 y.o. male last seen in April.  He underwent right-sided bur holes and placement of subdural drain for evacuation of a chronic subdural hematoma back in May, I reviewed the discharge summary.  Past Medical History:  Diagnosis Date   Chronic diarrhea    CKD (chronic kidney disease) stage 3, GFR 30-59 ml/min (HCC)    COPD (chronic obstructive pulmonary disease) (HCC)    Dysrhythmia 02/2022   paroxsymal SVT & aflutter 02/2022 XioXT   Essential hypertension    Gallstones    Gout    History of CHF (congestive heart failure)    History of colonic polyps    History of DVT (deep vein thrombosis) 04/19/2012   BLE DVT 04/09/12   Kidney stones    Lumbar disc disease    Neuropathy    Nonischemic cardiomyopathy (HCC) 2003   Minor coronary atherosclerosis at cardiac catheterization 2003, LVEF initially 20% with normalization   Obesity    Orthostatic hypotension    RA (rheumatoid arthritis) (HCC)    Sleep apnea    Subarachnoid hemorrhage (HCC) 02/03/2022   Traumatic   Subdural hematoma (HCC) 02/03/2022   Traumatic   Syncope, vasovagal    Type 2 diabetes mellitus (HCC)     Past Surgical History:  Procedure Laterality Date   BARIATRIC SURGERY  01/11/2018   BURR HOLE Right 05/04/2022   Procedure: BURR HOLES FOR Subdural Hematoma;  Surgeon: Bedelia Person, MD;  Location: [facility] OR;  Service: Neurosurgery;  Laterality: Right;   CHOLECYSTECTOMY  01/18/2012   Procedure: LAPAROSCOPIC CHOLECYSTECTOMY WITH INTRAOPERATIVE CHOLANGIOGRAM;  Surgeon: Clovis Pu. Cornett, MD;  Location: [facility];  Service: General;  Laterality: N/A;   EYE SURGERY     Film over retina was removed.   PILONIDAL CYST EXCISION  1997   UMBILICAL  HERNIA REPAIR     VARICOSE VEIN SURGERY     left leg    Current Outpatient Medications  Medication Sig Dispense Refill   acetaminophen (TYLENOL) 500 MG tablet Take 500 mg by mouth every 6 (six) hours as needed for mild pain.     allopurinol (ZYLOPRIM) 300 MG tablet Take 1 tablet (300 mg total) by mouth every morning. 90 tablet 2   amLODipine-olmesartan (AZOR) 10-40 MG tablet Take 1 tablet by mouth daily. 90 tablet 3   atorvastatin (LIPITOR) 80 MG tablet TAKE 1 TABLET BY MOUTH DAILY AT  6 PM 90 tablet 0   calcitRIOL (ROCALTROL) 0.25 MCG capsule Take 1 capsule (0.25 mcg total) by mouth daily with breakfast. 90 capsule 3   calcium carbonate (OSCAL) 1500 (600 Ca) MG TABS tablet Take 600 mg of elemental calcium by mouth 2 (two) times daily with a meal.     carvedilol (COREG) 3.125 MG tablet Take 3.125 mg by mouth in the morning.     carvedilol (COREG) 6.25 MG tablet Take 1 tablet (6.25 mg total) by mouth every evening. 90 tablet 3   celecoxib (CELEBREX) 200 MG capsule TAKE 1 CAPSULE BY MOUTH DAILY  WITH FOOD 90 capsule 2   cloNIDine (CATAPRES) 0.2 MG tablet Take 1 tablet (0.2 mg total) by mouth 2 (two) times daily. For Blood Pressure 180 tablet 3   Colchicine 0.6  MG CAPS Take 1 capsule by mouth daily. (Patient taking differently: Take 1 capsule by mouth daily as needed (gout flares.).) 90 capsule 2   dapagliflozin propanediol (FARXIGA) 10 MG TABS tablet Take 1 tablet (10 mg total) by mouth daily before breakfast. 90 tablet 3   DULoxetine (CYMBALTA) 30 MG capsule Take 1 capsule (30 mg total) by mouth 2 (two) times daily. 180 capsule 2   ferrous sulfate 325 (65 FE) MG tablet Take 325 mg by mouth in the morning.     fluocinolone (SYNALAR) 0.01 % external solution Place 3-5 drops in each ear canal twice daily 60 mL 5   fluticasone (FLONASE) 50 MCG/ACT nasal spray USE 2 SPRAYS IN BOTH  NOSTRILS DAILY AS NEEDED  FOR ALLERGIES OR RHINITIS 48 g 2   furosemide (LASIX) 40 MG tablet Take 1 tablet (40 mg  total) by mouth every morning. 90 tablet 2   hydroxychloroquine (PLAQUENIL) 200 MG tablet Take 1 tablet (200 mg total) by mouth in the morning and at bedtime. 180 tablet 3   Multiple Vitamin (MULTIVITAMIN) capsule Take 1 capsule by mouth daily.     neomycin-polymyxin-hydrocortisone (CORTISPORIN) 3.5-10000-1 OTIC suspension PLACE 4 DROPS INTO AFFECTED EAR TWICE DAILY FOR 10 DAYS 10 mL 3   pantoprazole (PROTONIX) 40 MG tablet Take 1 tablet (40 mg total) by mouth daily. 90 tablet 2   potassium chloride SA (KLOR-CON M) 20 MEQ tablet TAKE 1 TABLET BY MOUTH DAILY FOR POTASSIUM REPLACEMENT/  SUPPLEMENT 90 tablet 2   sildenafil (REVATIO) 20 MG tablet Take 2-5 pills at once, orally, with each sexual encounter 50 tablet 5   tamsulosin (FLOMAX) 0.4 MG CAPS capsule Take 1 capsule (0.4 mg total) by mouth at bedtime. 90 capsule 2   vitamin B-12 (CYANOCOBALAMIN) 500 MCG tablet Take 500 mcg by mouth daily.     No current facility-administered medications for this visit.   Allergies:  Ciprofloxacin, Definity [perflutren lipid microsphere], and Levofloxacin [levofloxacin]   Social History: The patient  reports that he quit smoking about 27 years ago. His smoking use included cigarettes. He has never used smokeless tobacco. He reports that he does not drink alcohol and does not use drugs.   Family History: The patient's family history includes Diabetes in his brother and father; Heart disease in his father; Hypertension in his father and mother; Stroke in his mother.   ROS:  Please see the history of present illness. Otherwise, complete review of systems is positive for {NONE DEFAULTED:18576}.  All other systems are reviewed and negative.   Physical Exam: VS:  There were no vitals taken for this visit., BMI There is no height or weight on file to calculate BMI.  Wt Readings from Last 3 Encounters:  09/28/22 (!) 322 lb 12.8 oz (146.4 kg)  08/02/22 (!) 312 lb 3.2 oz (141.6 kg)  06/01/22 (!) 310 lb 12.8 oz (141  kg)    General: Patient appears comfortable at rest. HEENT: Conjunctiva and lids normal, oropharynx clear with moist mucosa. Neck: Supple, no elevated JVP or carotid bruits, no thyromegaly. Lungs: Clear to auscultation, nonlabored breathing at rest. Cardiac: Regular rate and rhythm, no S3 or significant systolic murmur, no pericardial rub. Abdomen: Soft, nontender, no hepatomegaly, bowel sounds present, no guarding or rebound. Extremities: No pitting edema, distal pulses 2+. Skin: Warm and dry. Musculoskeletal: No kyphosis. Neuropsychiatric: Alert and oriented x3, affect grossly appropriate.  ECG:  An ECG dated 02/23/2022 was personally reviewed today and demonstrated:  Sinus bradycardia with IVCD.  Recent  Labwork: 02/10/2022: B Natriuretic Peptide 59.4 09/28/2022: ALT 35; AST 34; BUN 29; Creatinine, Ser 1.39; Hemoglobin 12.6; Platelets 137; Potassium 5.0; Sodium 141     Component Value Date/Time   CHOL 110 09/28/2022 0854   TRIG 152 (H) 09/28/2022 0854   HDL 33 (L) 09/28/2022 0854   CHOLHDL 3.3 09/28/2022 0854   CHOLHDL 11.7 10/22/2011 1030   VLDL 39 10/22/2011 1030   LDLCALC 51 09/28/2022 0854    Other Studies Reviewed Today:  Echocardiogram 03/08/2022:  1. Left ventricular ejection fraction, by estimation, is 60 to 65%. The  left ventricle has normal function. Left ventricular endocardial border  not optimally defined to evaluate regional wall motion. There is mild left  ventricular hypertrophy. Left  ventricular diastolic parameters were normal.   2. Right ventricular systolic function is normal. The right ventricular  size is normal. Tricuspid regurgitation signal is inadequate for assessing  PA pressure.   3. The mitral valve is normal in structure. No evidence of mitral valve  regurgitation. No evidence of mitral stenosis.   4. The aortic valve was not well visualized. Aortic valve regurgitation  is not visualized. No aortic stenosis is present.   5. Aortic dilatation  noted. There is mild dilatation of the ascending  aorta, measuring 38 mm.   6. The inferior vena cava is normal in size with greater than 50%  respiratory variability, suggesting right atrial pressure of 3 mmHg.   7. Possible small PFO with mild left to right shunt.    Cardiac monitor March 2023: ZIO XT reviewed.  5 days, 9 hours analyzed.  Predominant rhythm is sinus with heart rate ranging from 48 bpm up to 109 bpm and average heart rate 60 bpm.   There were rare PACs including atrial couplets and triplets representing less than 1% total beats.  There were rare PVCs including ventricular couplets representing less than 1% total beats.  Ventricular bigeminy was also noted.   Episodes of SVT were noted, more specifically atrial flutter with variable conduction was noted approximately 17% of the time.  Average heart rate was in the 70s.  Longest episode lasted for 22 hours and 33 minutes.  Assessment and Plan:    Medication Adjustments/Labs and Tests Ordered: Current medicines are reviewed at length with the patient today.  Concerns regarding medicines are outlined above.   Tests Ordered: No orders of the defined types were placed in this encounter.   Medication Changes: No orders of the defined types were placed in this encounter.   Disposition:  Follow up {follow up:15908}  Signed, Satira Sark, MD, [facility] 10/09/2022 7:09 PM    [facility] [place], [place], [place] [address] Phone: [phone]; Fax: [phone]

## 2022-10-10 ENCOUNTER — Ambulatory Visit: Payer: Medicare Other | Admitting: Cardiology

## 2022-10-10 DIAGNOSIS — I483 Typical atrial flutter: Secondary | ICD-10-CM

## 2022-10-11 ENCOUNTER — Telehealth: Payer: Self-pay | Admitting: Family Medicine

## 2022-10-11 ENCOUNTER — Other Ambulatory Visit: Payer: Self-pay | Admitting: Family Medicine

## 2022-10-11 DIAGNOSIS — N1832 Chronic kidney disease, stage 3b: Secondary | ICD-10-CM

## 2022-10-11 DIAGNOSIS — M79675 Pain in left toe(s): Secondary | ICD-10-CM | POA: Diagnosis not present

## 2022-10-11 DIAGNOSIS — M86172 Other acute osteomyelitis, left ankle and foot: Secondary | ICD-10-CM | POA: Diagnosis not present

## 2022-10-11 DIAGNOSIS — I10 Essential (primary) hypertension: Secondary | ICD-10-CM

## 2022-10-11 DIAGNOSIS — I509 Heart failure, unspecified: Secondary | ICD-10-CM

## 2022-10-11 MED ORDER — CARVEDILOL 6.25 MG PO TABS: ORAL_TABLET | ORAL | 3 refills | Status: DC

## 2022-10-11 NOTE — Telephone Encounter (Signed)
[facility] does not have the Coreg 3.125 mg This is a historical med on pt's list Please advise

## 2022-10-11 NOTE — Telephone Encounter (Signed)
IS HE SUPPOSED TO BE TAKING BOTH STRENGTHS? IF NOT WHICH DO YOU WANT HIM ON?

## 2022-10-11 NOTE — Telephone Encounter (Signed)
[facility] pharmacy called stating that they sent [place] a message regarding pts Coreg Rx and received a response back from our office stating that pt is taking Coreg 6.25 and Coreg 3.125.. Says that they do not have the Coreg 3.125 Rx. Requesting it be sent to them.   Ref# [id number]

## 2022-10-11 NOTE — Telephone Encounter (Signed)
Plaes have him take 1/2 of a 6.25 pill

## 2022-10-12 ENCOUNTER — Encounter: Payer: Self-pay | Admitting: *Deleted

## 2022-10-12 DIAGNOSIS — Z01818 Encounter for other preprocedural examination: Secondary | ICD-10-CM | POA: Diagnosis not present

## 2022-10-13 DIAGNOSIS — I10 Essential (primary) hypertension: Secondary | ICD-10-CM | POA: Diagnosis not present

## 2022-10-13 DIAGNOSIS — L02415 Cutaneous abscess of right lower limb: Secondary | ICD-10-CM | POA: Diagnosis not present

## 2022-10-13 DIAGNOSIS — M868X7 Other osteomyelitis, ankle and foot: Secondary | ICD-10-CM | POA: Diagnosis not present

## 2022-10-13 DIAGNOSIS — I2510 Atherosclerotic heart disease of native coronary artery without angina pectoris: Secondary | ICD-10-CM | POA: Diagnosis not present

## 2022-10-13 DIAGNOSIS — E11628 Type 2 diabetes mellitus with other skin complications: Secondary | ICD-10-CM | POA: Diagnosis not present

## 2022-10-13 DIAGNOSIS — L97519 Non-pressure chronic ulcer of other part of right foot with unspecified severity: Secondary | ICD-10-CM | POA: Diagnosis not present

## 2022-10-13 DIAGNOSIS — D696 Thrombocytopenia, unspecified: Secondary | ICD-10-CM | POA: Diagnosis not present

## 2022-10-13 DIAGNOSIS — M79675 Pain in left toe(s): Secondary | ICD-10-CM | POA: Diagnosis not present

## 2022-10-13 DIAGNOSIS — M86171 Other acute osteomyelitis, right ankle and foot: Secondary | ICD-10-CM | POA: Diagnosis not present

## 2022-10-13 DIAGNOSIS — E1169 Type 2 diabetes mellitus with other specified complication: Secondary | ICD-10-CM | POA: Diagnosis not present

## 2022-10-13 DIAGNOSIS — E11621 Type 2 diabetes mellitus with foot ulcer: Secondary | ICD-10-CM | POA: Diagnosis not present

## 2022-10-13 DIAGNOSIS — M869 Osteomyelitis, unspecified: Secondary | ICD-10-CM | POA: Diagnosis not present

## 2022-10-15 IMAGING — CT CT HEAD W/O CM
4 series · 15 of 47 positions shown, 17 images · non-contrast
Comparison: 02/10/2022

CLINICAL DATA: Follow-up subdural hematoma



[Series 3: head wo · axial · 0.46mm/px · z∈[-154,-39]mm · 7 of 31 slices shown, 9 images]
[im 4/31  brain]
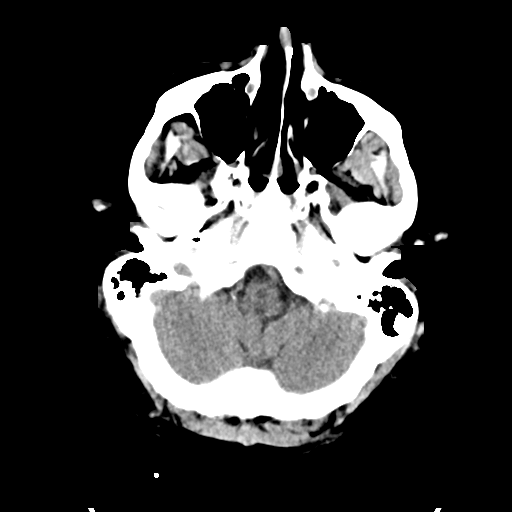
[im 4/31  bone]
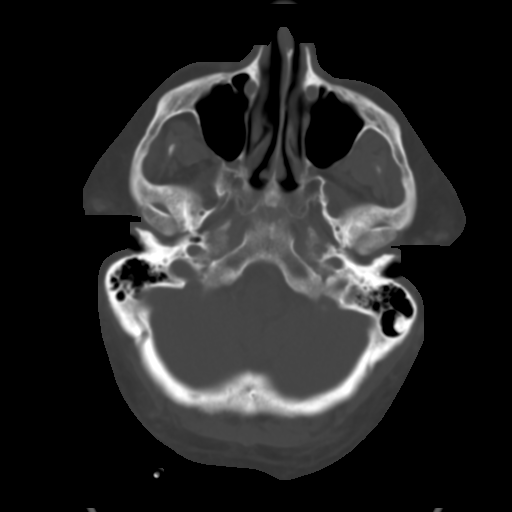
[im 8/31  brain]
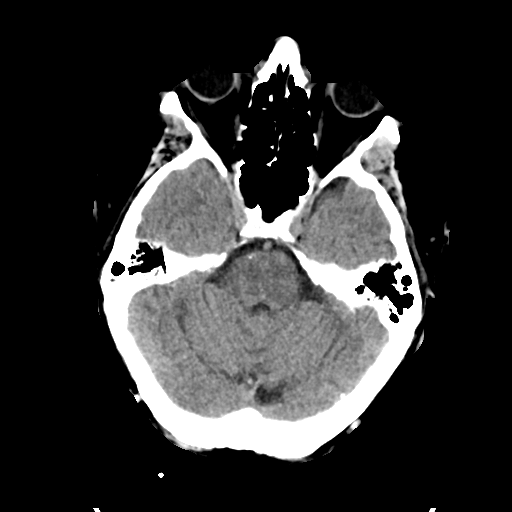
[im 12/31  brain]
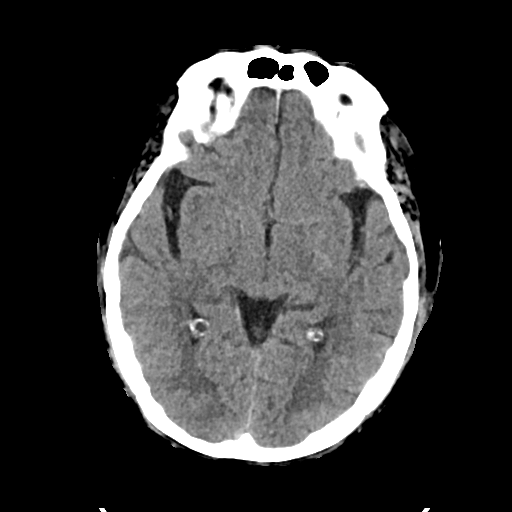
[im 16/31  brain]
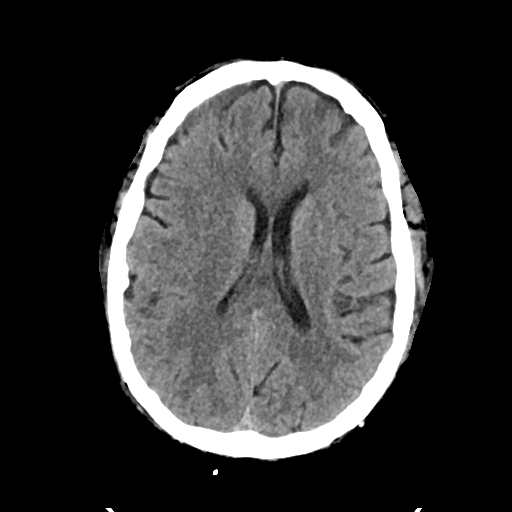
[im 19/31  brain]
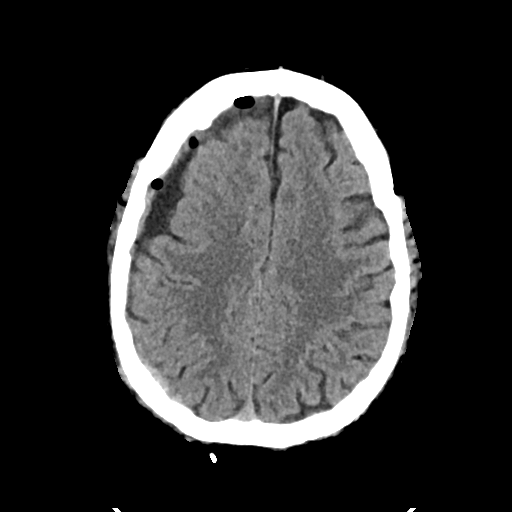
[im 19/31  bone]
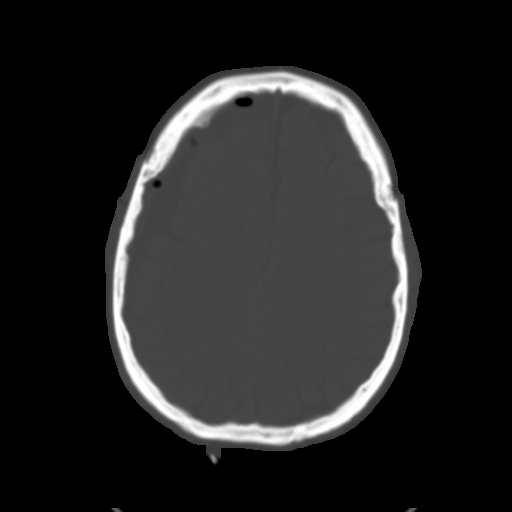
[im 23/31  brain]
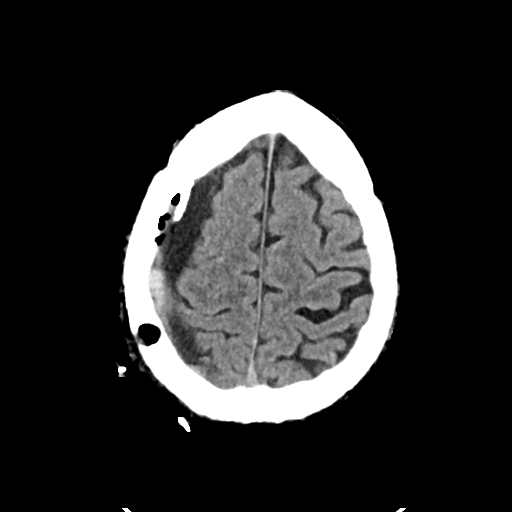
[im 27/31  brain]
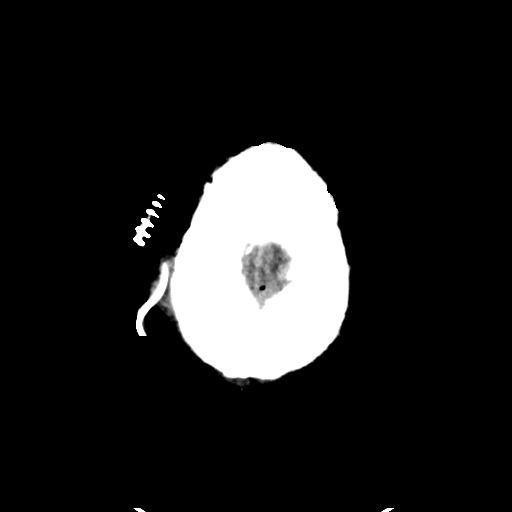

[Series 4: head bone · axial · 0.46mm/px · z∈[-155,-139]mm · 2 of 78 slices shown]
[im 8/78  bone]
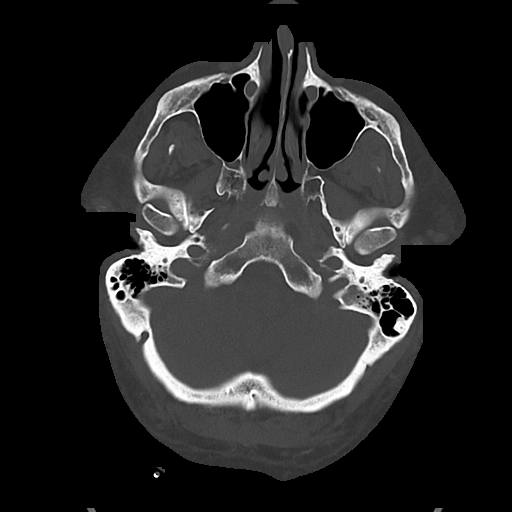
[im 16/78  bone]
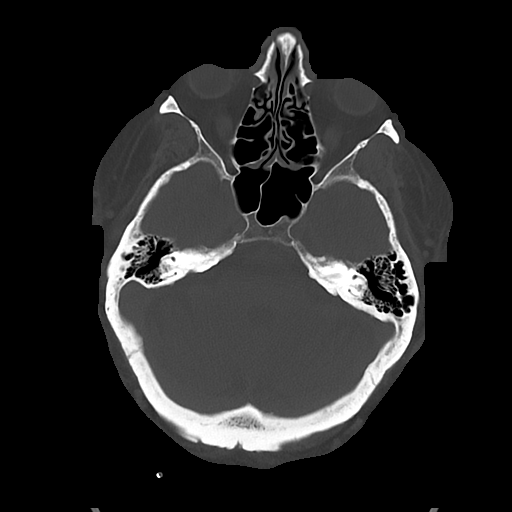

[Series 5: cor soft · coronal · 0.29mm/px · 3 of 72 slices shown]
[im 24/72  brain]
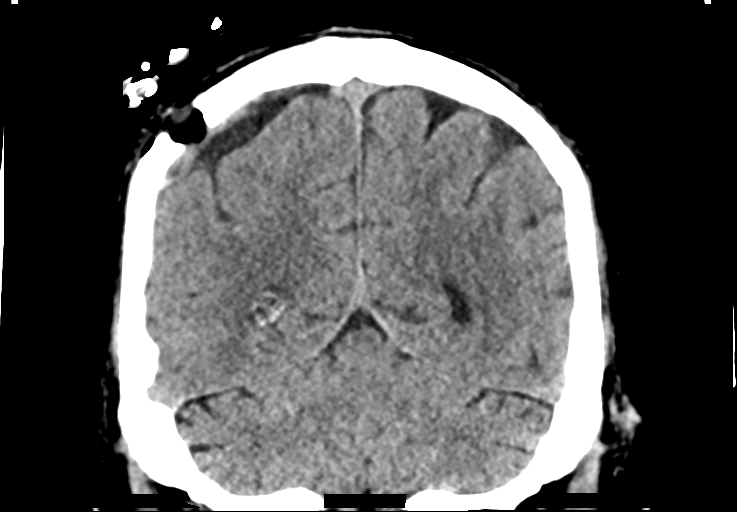
[im 32/72  brain]
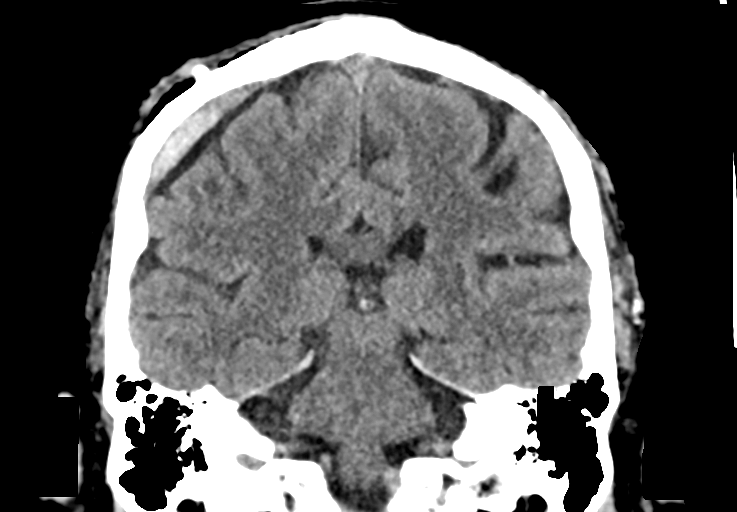
[im 40/72  brain]
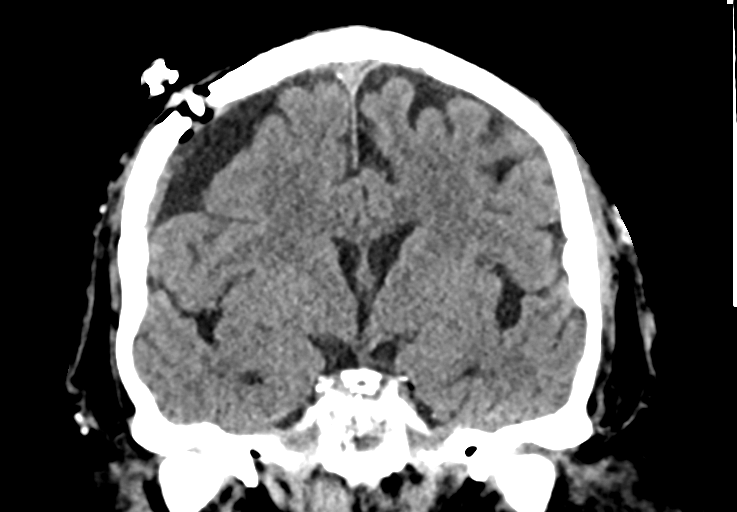

[Series 6: sag soft · sagittal · 0.29mm/px · 3 of 63 slices shown]
[im 21/63  brain]
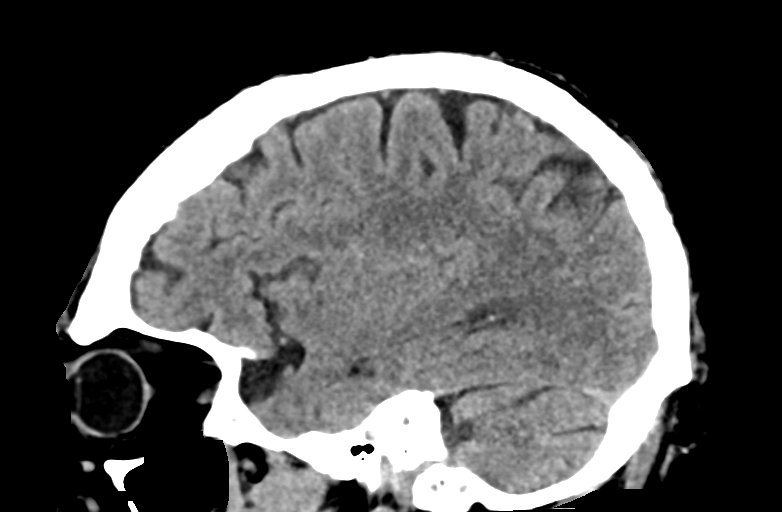
[im 32/63  brain]
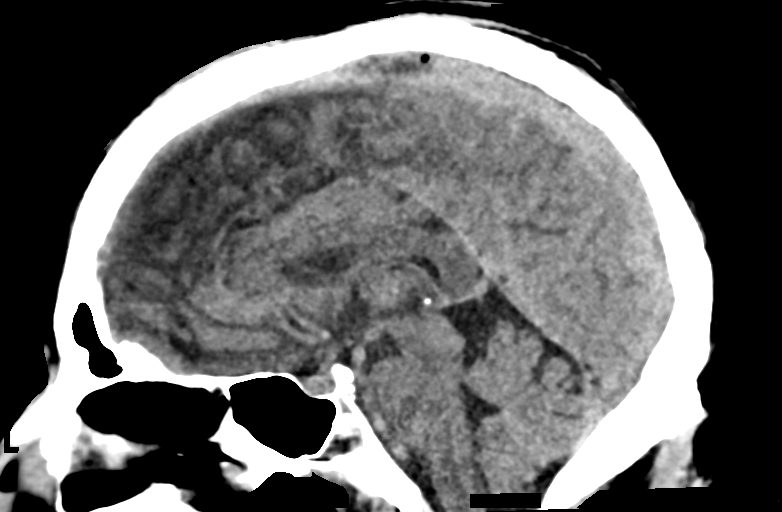
[im 42/63  brain]
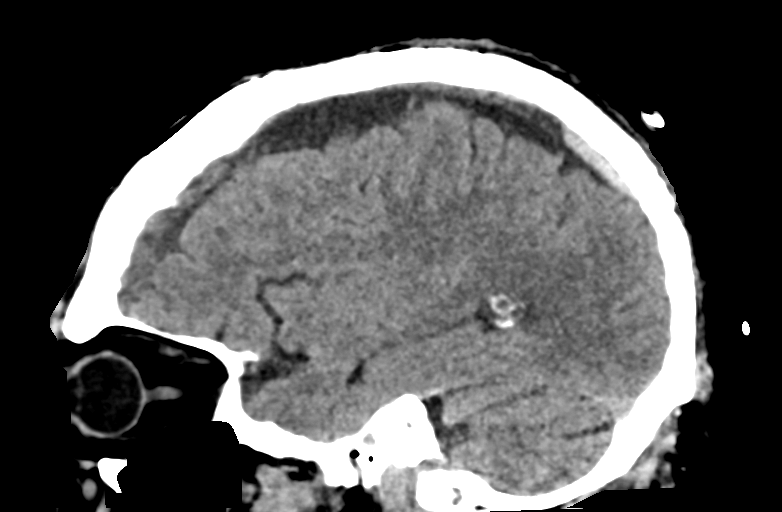

[15 of 47 positions shown; findings below may reference images not displayed]

FINDINGS: Brain: Interval decompression of subdural hematoma on the right with
improved cerebral mass effect and nearly resolved midline shift.
Residual subdural collection on the right is mixed density
(primarily low-density) with drain in place. Maximal residual
collection measures 13 mm at the level of the drain entry. No
complicating infarct or hydrocephalus.

Vascular: No hyperdense vessel or unexpected calcification.

Skull: Right-sided drainage holes.  No acute finding.

Sinuses/Orbits: Negative
IMPRESSION: No complicating features of subdural hematoma decompression.
Cerebral mass effect is improved.

## 2022-10-26 ENCOUNTER — Other Ambulatory Visit: Payer: Self-pay | Admitting: Family Medicine

## 2022-11-07 ENCOUNTER — Ambulatory Visit: Payer: Medicare Other | Admitting: Gastroenterology

## 2022-11-16 ENCOUNTER — Ambulatory Visit (INDEPENDENT_AMBULATORY_CARE_PROVIDER_SITE_OTHER): Payer: Medicare Other | Admitting: Gastroenterology

## 2022-11-16 ENCOUNTER — Encounter: Payer: Self-pay | Admitting: Gastroenterology

## 2022-11-16 VITALS — BP 102/55 | HR 57 | Temp 97.8°F | Ht 72.0 in | Wt 317.0 lb

## 2022-11-16 DIAGNOSIS — D649 Anemia, unspecified: Secondary | ICD-10-CM

## 2022-11-16 DIAGNOSIS — R197 Diarrhea, unspecified: Secondary | ICD-10-CM | POA: Diagnosis not present

## 2022-11-16 NOTE — Progress Notes (Signed)
GI Office Note    Referring Provider: [name], MD Primary Care Physician:  [name], MD  Primary Gastroenterologist: Formerly Dr. Darrick Penna  Chief Complaint   Chief Complaint  Patient presents with   Constipation    Has issues with constipation and diarrhea. One week it will be diarrhea, next week will have constipation. Currently having issues with constipation.     History of Present Illness   Mitchell Parker. is a 71 y.o. male presenting today at the request of Dr. Darlyn Read for diarrhea. Had colonoscopy with approximately 4 years ago in [place], history of colon polyps. Prefers to be seen at [facility].  Patient has a history of gastroparesis.  Was seen by our practice in 2018 but at that time was referred out for colonoscopy because he was considered high risk for anesthesia at [facility].  BMI at that time was 50.  Patient presents with complaints of alternating constipation and diarrhea.  Symptoms for 6 months.  Predominantly diarrhea.  Will have diarrhea up to 10 days at a time.  After about the first 2 days he will start taking Pepto-Bismol (drinks like water) and after a couple of days the diarrhea stops and he developed constipation.  The cycle then starts over.  Typically has a lot of stomach growling and noises.  Symptoms associated with lower abdominal pain, fecal urgency and sometimes incontinence.  He describes nocturnal diarrhea while he is asleep.  Denies melena or rectal bleeding.  No nausea or vomiting.  No dysphagia.  Initially metformin was stopped to see if this was helpful.  He stopped about 6 weeks ago and started [place] but really has not noticed any improvement in his symptoms.  Rarely uses colchicine, only for flare of gout which is infrequent.  He is currently on Keflex for recent toe amputation.  After gastric sleeve surgery in 2019, his gastroparesis symptoms seem to resolve.  No longer on PPI.  Lost about 100 pounds.  Labs from October 12, 2022: Sodium 138, potassium 4.6, BUN 24, creatinine 1.27, glucose 213, total bilirubin 0.5, alkaline phosphatase 83, AST 26, ALT 36, albumin 3.6.  Labs from September 28, 2022: White blood cell count 4000, hemoglobin 12.6 down from 13.3 in May, hematocrit 36.9, MCV 95, platelets 137,000.  A1c 8.1.    Medications   Current Outpatient Medications  Medication Sig Dispense Refill   acetaminophen (TYLENOL) 500 MG tablet Take 500 mg by mouth every 6 (six) hours as needed for mild pain.     allopurinol (ZYLOPRIM) 300 MG tablet Take 1 tablet (300 mg total) by mouth every morning. 90 tablet 2   amLODipine-olmesartan (AZOR) 10-40 MG tablet Take 1 tablet by mouth daily. 90 tablet 3   atorvastatin (LIPITOR) 80 MG tablet TAKE 1 TABLET BY MOUTH DAILY AT  6 PM 90 tablet 0   calcitRIOL (ROCALTROL) 0.25 MCG capsule Take 1 capsule (0.25 mcg total) by mouth daily with breakfast. 90 capsule 3   calcium carbonate (OSCAL) 1500 (600 Ca) MG TABS tablet Take 600 mg of elemental calcium by mouth 2 (two) times daily with a meal.     carvedilol (COREG) 6.25 MG tablet 1/2 q am & 1 qpm 135 tablet 3   celecoxib (CELEBREX) 200 MG capsule TAKE 1 CAPSULE BY MOUTH DAILY  WITH FOOD 90 capsule 2   cephALEXin (KEFLEX) 500 MG capsule Take 500 mg by mouth 3 (three) times daily.     cloNIDine (CATAPRES) 0.2 MG tablet Take  1 tablet (0.2 mg total) by mouth 2 (two) times daily. For Blood Pressure 180 tablet 3   Colchicine 0.6 MG CAPS Take 1 capsule by mouth daily. (Patient taking differently: Take 1 capsule by mouth daily as needed (gout flares.).) 90 capsule 2   dapagliflozin propanediol (FARXIGA) 10 MG TABS tablet Take 1 tablet (10 mg total) by mouth daily before breakfast. 90 tablet 3   DULoxetine (CYMBALTA) 30 MG capsule Take 1 capsule (30 mg total) by mouth 2 (two) times daily. 180 capsule 2   ferrous sulfate 325 (65 FE) MG tablet Take 325 mg by mouth in the morning.     fluocinolone (SYNALAR) 0.01 % external solution Place 3-5 drops  in each ear canal twice daily 60 mL 5   fluticasone (FLONASE) 50 MCG/ACT nasal spray USE 2 SPRAYS IN BOTH  NOSTRILS DAILY AS NEEDED  FOR ALLERGIES OR RHINITIS 48 g 2   furosemide (LASIX) 40 MG tablet Take 1 tablet (40 mg total) by mouth every morning. 90 tablet 2   hydroxychloroquine (PLAQUENIL) 200 MG tablet Take 1 tablet (200 mg total) by mouth in the morning and at bedtime. 180 tablet 3   Multiple Vitamin (MULTIVITAMIN) capsule Take 1 capsule by mouth daily.     neomycin-polymyxin-hydrocortisone (CORTISPORIN) 3.5-10000-1 OTIC suspension PLACE 4 DROPS INTO AFFECTED EAR TWICE DAILY FOR 10 DAYS 10 mL 3   potassium chloride SA (KLOR-CON M) 20 MEQ tablet TAKE 1 TABLET BY MOUTH DAILY FOR POTASSIUM REPLACEMENT/  SUPPLEMENT 90 tablet 2   sildenafil (REVATIO) 20 MG tablet Take 2-5 pills at once, orally, with each sexual encounter 50 tablet 5   tamsulosin (FLOMAX) 0.4 MG CAPS capsule Take 1 capsule (0.4 mg total) by mouth at bedtime. 90 capsule 2   vitamin B-12 (CYANOCOBALAMIN) 500 MCG tablet Take 500 mcg by mouth daily.     No current facility-administered medications for this visit.    Allergies   Allergies as of 11/16/2022 - Review Complete 11/16/2022  Allergen Reaction Noted   Ciprofloxacin Hives 10/26/2011   Definity [perflutren lipid microsphere] Nausea And Vomiting 09/07/2018   Levofloxacin [levofloxacin] Hives 10/26/2011    Past Medical History   Past Medical History:  Diagnosis Date   Chronic diarrhea    CKD (chronic kidney disease) stage 3, GFR 30-59 ml/min (HCC)    COPD (chronic obstructive pulmonary disease) ([place])    Dysrhythmia 02/2022   paroxsymal SVT & aflutter 02/2022 XioXT   Essential hypertension    Gallstones    Gout    History of CHF (congestive heart failure)    History of colonic polyps    History of DVT (deep vein thrombosis) 04/19/2012   BLE DVT 04/09/12   Kidney stones    Lumbar disc disease    Neuropathy    Nonischemic cardiomyopathy ([place]) 2003   Minor  coronary atherosclerosis at cardiac catheterization 2003, LVEF initially 20% with normalization   Obesity    Orthostatic hypotension    RA (rheumatoid arthritis) (HCC)    Sleep apnea    Subarachnoid hemorrhage ([place]) 02/03/2022   Traumatic   Subdural hematoma ([place]) 02/03/2022   Traumatic   Syncope, vasovagal    Type 2 diabetes mellitus ([place])     Past Surgical History   Past Surgical History:  Procedure Laterality Date   BARIATRIC SURGERY  01/11/2018   BURR HOLE Right 05/04/2022   Procedure: BURR HOLES FOR Subdural Hematoma;  Surgeon: Vallarie Mare, MD;  Location: [facility];  Service: Neurosurgery;  Laterality: Right;  CHOLECYSTECTOMY  01/18/2012   Procedure: LAPAROSCOPIC CHOLECYSTECTOMY WITH INTRAOPERATIVE CHOLANGIOGRAM;  Surgeon: Marcello Moores A. Cornett, MD;  Location: [facility];  Service: General;  Laterality: N/A;   EYE SURGERY     Film over retina was removed.   PILONIDAL CYST EXCISION  0000000   UMBILICAL HERNIA REPAIR     VARICOSE VEIN SURGERY     left leg    Past Family History   Family History  Problem Relation Age of Onset   Hypertension Mother    Stroke Mother    Diabetes Father    Hypertension Father    Heart disease Father    Diabetes Brother    Colon cancer Neg Hx     Past Social History   Social History   Socioeconomic History   Marital status: Married    Spouse name: Joycelyn Schmid   Number of children: 2   Years of education: Not on file   Highest education level: Some college, no degree  Occupational History   Occupation: retired  Tobacco Use   Smoking status: Former    Types: Cigarettes    Quit date: 12/26/1994    Years since quitting: 27.9   Smokeless tobacco: Never  Vaping Use   Vaping Use: Never used  Substance and Sexual Activity   Alcohol use: No   Drug use: No   Sexual activity: Not Currently  Other Topics Concern   Not on file  Social History Narrative   Lives at home with wife, and has two adult children. Still ambulatory, no cane or walker.     Works 30 hours per week at [employer].   Has VA benefits - goes to [place] once or twice a year for vision, hearing, screenings, vaccines, etc.   Social Determinants of Health   Financial Resource Strain: Low Risk  (10/05/2021)   Overall Financial Resource Strain (CARDIA)    Difficulty of Paying Living Expenses: Not hard at all  Food Insecurity: No Food Insecurity (10/05/2021)   Hunger Vital Sign    Worried About Running Out of Food in the Last Year: Never true    Ran Out of Food in the Last Year: Never true  Transportation Needs: No Transportation Needs (10/05/2021)   PRAPARE - Hydrologist (Medical): No    Lack of Transportation (Non-Medical): No  Physical Activity: Sufficiently Active (10/05/2021)   Exercise Vital Sign    Days of Exercise per Week: 5 days    Minutes of Exercise per Session: 60 min  Stress: No Stress Concern Present (10/05/2021)   [place]    Feeling of Stress : Not at all  Social Connections: Moderately Isolated (10/05/2021)   Social Connection and Isolation Panel [NHANES]    Frequency of Communication with Friends and Family: Once a week    Frequency of Social Gatherings with Friends and Family: Once a week    Attends Religious Services: Never    Marine scientist or Organizations: Yes    Attends Music therapist: More than 4 times per year    Marital Status: Married  Human resources officer Violence: Not At Risk (10/05/2021)   Humiliation, Afraid, Rape, and Kick questionnaire    Fear of Current or Ex-Partner: No    Emotionally Abused: No    Physically Abused: No    Sexually Abused: No    Review of Systems   General: Negative for anorexia, weight loss, fever, chills, fatigue, weakness. Eyes: Negative for  vision changes.  ENT: Negative for hoarseness, difficulty swallowing, nasal congestion. CV: Negative for chest pain, angina, palpitations, dyspnea on  exertion, peripheral edema.  Respiratory: Negative for dyspnea at rest, dyspnea on exertion, cough, sputum, wheezing.  GI: See history of present illness. GU:  Negative for dysuria, hematuria, urinary incontinence, urinary frequency, nocturnal urination.  MS: Negative for joint pain, low back pain.  Derm: Negative for rash or itching.  Neuro: Negative for weakness, abnormal sensation, seizure, frequent headaches, memory loss,  confusion.  Psych: Negative for anxiety, depression, suicidal ideation, hallucinations.  Endo: Negative for unusual weight change.  Heme: Negative for bruising or bleeding. Allergy: Negative for rash or hives.  Physical Exam   BP (!) 102/55   Pulse (!) 57   Temp 97.8 F (36.6 C) (Oral)   Ht 6' (1.829 m)   Wt (!) 317 lb (143.8 kg)   SpO2 95%   BMI 42.99 kg/m    General: Well-nourished, well-developed in no acute distress.  Head: Normocephalic, atraumatic.   Eyes: Conjunctiva pink, no icterus. Mouth: Oropharyngeal mucosa moist and pink , no lesions erythema or exudate. Neck: Supple without thyromegaly, masses, or lymphadenopathy.  Lungs: Clear to auscultation bilaterally.  Heart: Regular rate and rhythm, no murmurs rubs or gallops.  Abdomen: Bowel sounds are normal, nontender, nondistended, no hepatosplenomegaly or masses, no abdominal bruits or hernia, no rebound or guarding.  Exam limited by body habitus. Rectal: not performed Extremities: trace bilateral lower extremity edema. No clubbing or deformities.  Neuro: Alert and oriented x 4 , grossly normal neurologically.  Skin: Warm and dry, no rash or jaundice.   Psych: Alert and cooperative, normal mood and affect.  Labs   See HPI  Imaging Studies   No results found.  Assessment   Change in bowels: Predominance of diarrhea, constipation likely induced by Pepto-Bismol.  Given recent antibiotics, hospitalizations/surgeries, recommend ruling out infectious etiology initially.  We will also screen  for celiac disease.  Based on findings, anticipate at least a colonoscopy.  She reports history of diverticulosis, recommend adding daily fiber if tolerated.  We will also have him limit Pepto-Bismol, hold off unless he has diarrhea persisting for 48 hours or more than 5 stools in a 24-hour period of time.  Recommend he take per package instructions  History of colon polyps: Patient states he should be due for colonoscopy at any time.  We have requested records for review.  Mild normocytic anemia: Check iron studies.  PLAN   Obtain labs/stools. Begin Benefiber 2 teaspoons daily for 7 days, then increase to twice daily if tolerated. Request copy of last colonoscopy report. Limit Pepto-Bismol to avoid constipation.  Take per package instructions but only take if he has more than 5 stools in a 24-hour period of time or persisting diarrhea for more than 48 hours.  If stool studies unremarkable, consider dicyclomine given significant postprandial component.   Laureen Ochs. Bobby Rumpf, [place], [facility]

## 2022-11-16 NOTE — Patient Instructions (Signed)
With history of diverticulosis, you should consider taking daily fiber supplement. This could also help regulate your stools. Take Benefiber 2 teaspoons daily for 7 days, then increase to twice daily if tolerated.  Collect stool for testing. Complete lab work. Based on results, we may start you on dicyclomine to help with stool frequency, urgency especially after meals.  Try to limit PeptoBismol to avoid constipation. Use only if diarrhea persists for more than 48 hours or if you have greater than five stools in a 24 hour period of time. Take dose as per package instructions.  We will request copy of records from [facility] for your last colonoscopy.

## 2022-11-20 LAB — CBC WITH DIFFERENTIAL/PLATELET
Basophils Absolute: 0 10*3/uL (ref 0.0–0.2)
Basos: 1 %
EOS (ABSOLUTE): 0.3 10*3/uL (ref 0.0–0.4)
Eos: 6 %
Hematocrit: 39.4 % (ref 37.5–51.0)
Hemoglobin: 13.6 g/dL (ref 13.0–17.7)
Immature Grans (Abs): 0 10*3/uL (ref 0.0–0.1)
Immature Granulocytes: 0 %
Lymphocytes Absolute: 1 10*3/uL (ref 0.7–3.1)
Lymphs: 20 %
MCH: 33.3 pg — ABNORMAL HIGH (ref 26.6–33.0)
MCHC: 34.5 g/dL (ref 31.5–35.7)
MCV: 96 fL (ref 79–97)
Monocytes Absolute: 0.4 10*3/uL (ref 0.1–0.9)
Monocytes: 8 %
Neutrophils Absolute: 3.4 10*3/uL (ref 1.4–7.0)
Neutrophils: 65 %
Platelets: 145 10*3/uL — ABNORMAL LOW (ref 150–450)
RBC: 4.09 x10E6/uL — ABNORMAL LOW (ref 4.14–5.80)
RDW: 13.5 % (ref 11.6–15.4)
WBC: 5.2 10*3/uL (ref 3.4–10.8)

## 2022-11-20 LAB — IRON,TIBC AND FERRITIN PANEL
Ferritin: 78 ng/mL (ref 30–400)
Iron Saturation: 42 % (ref 15–55)
Iron: 128 ug/dL (ref 38–169)
Total Iron Binding Capacity: 308 ug/dL (ref 250–450)
UIBC: 180 ug/dL (ref 111–343)

## 2022-11-20 LAB — IGA: IgA/Immunoglobulin A, Serum: 82 mg/dL (ref 61–437)

## 2022-11-20 LAB — TSH+FREE T4
Free T4: 1.32 ng/dL (ref 0.82–1.77)
TSH: 3.65 u[IU]/mL (ref 0.450–4.500)

## 2022-11-20 LAB — TISSUE TRANSGLUTAMINASE, IGA: Transglutaminase IgA: <2 U/mL (ref 0–3)

## 2022-11-22 NOTE — Progress Notes (Unsigned)
Cardiology Office Note  Date: 11/23/2022   ID: Mitchell Parker., DOB 07/21/1951, MRN [id number]  PCP:  [name], MD  Cardiologist:  [name], MD Electrophysiologist:  None   Chief Complaint  Patient presents with   Cardiac follow-up    History of Present Illness: Mitchell Parker. is a 71 y.o. male last seen in April.  He is here for a follow-up visit.  States that he has completed follow-up with neurosurgery, history of fall with traumatic subdural hematomas and subarachnoid hemorrhage (was not on anticoagulation at that time).  He did have subsequent hospitalization in May with placement of right-sided cranial bur holes and subdural drain for evacuation of chronic subdural hematoma with mass effect.  This is subsequently improved.  He has paroxysmal SVT and atrial flutter with variable conduction documented by cardiac monitor (17% of the time) and his CHA2DS2-VASc score is 5.  We have not pursued anticoagulation in light of the above history, cardiac arrhythmias have been documented around the same time course.  I talked with him about referral for a Watchman device consultation.  Echocardiogram done earlier this year showed continued normal LVEF in the range of 60 to 65%.  We went over his medications.  He reports compliance with therapy.  Past Medical History:  Diagnosis Date   Chronic diarrhea    CKD (chronic kidney disease) stage 3, GFR 30-59 ml/min (HCC)    COPD (chronic obstructive pulmonary disease) (HCC)    Dysrhythmia 02/2022   paroxsymal SVT & aflutter 02/2022 XioXT   Essential hypertension    Gallstones    Gout    History of CHF (congestive heart failure)    History of colonic polyps    History of DVT (deep vein thrombosis) 04/19/2012   BLE DVT 04/09/12   Kidney stones    Lumbar disc disease    Neuropathy    Nonischemic cardiomyopathy (HCC) 2003   Minor coronary atherosclerosis at cardiac catheterization 2003, LVEF initially 20% with normalization    Obesity    Orthostatic hypotension    RA (rheumatoid arthritis) (HCC)    Sleep apnea    Subarachnoid hemorrhage (HCC) 02/03/2022   Traumatic   Subdural hematoma (HCC) 02/03/2022   Traumatic   Syncope, vasovagal    Type 2 diabetes mellitus (HCC)     Past Surgical History:  Procedure Laterality Date   BARIATRIC SURGERY  01/11/2018   BURR HOLE Right 05/04/2022   Procedure: BURR HOLES FOR Subdural Hematoma;  Surgeon: Bedelia Person, MD;  Location: [facility];  Service: Neurosurgery;  Laterality: Right;   CHOLECYSTECTOMY  01/18/2012   Procedure: LAPAROSCOPIC CHOLECYSTECTOMY WITH INTRAOPERATIVE CHOLANGIOGRAM;  Surgeon: Clovis Pu. Cornett, MD;  Location: [facility] ORS;  Service: General;  Laterality: N/A;   EYE SURGERY     Film over retina was removed.   PILONIDAL CYST EXCISION  1997   TOE AMPUTATION     UMBILICAL HERNIA REPAIR     VARICOSE VEIN SURGERY     left leg    Current Outpatient Medications  Medication Sig Dispense Refill   acetaminophen (TYLENOL) 500 MG tablet Take 500 mg by mouth every 6 (six) hours as needed for mild pain.     allopurinol (ZYLOPRIM) 300 MG tablet Take 1 tablet (300 mg total) by mouth every morning. 90 tablet 2   amLODipine-olmesartan (AZOR) 10-40 MG tablet Take 1 tablet by mouth daily. 90 tablet 3   atorvastatin (LIPITOR) 80 MG tablet TAKE 1 TABLET BY MOUTH DAILY AT  6 PM 90 tablet 0   calcitRIOL (ROCALTROL) 0.25 MCG capsule Take 1 capsule (0.25 mcg total) by mouth daily with breakfast. 90 capsule 3   calcium carbonate (OSCAL) 1500 (600 Ca) MG TABS tablet Take 600 mg of elemental calcium by mouth 2 (two) times daily with a meal.     carvedilol (COREG) 6.25 MG tablet 1/2 q am & 1 qpm 135 tablet 3   celecoxib (CELEBREX) 200 MG capsule TAKE 1 CAPSULE BY MOUTH DAILY  WITH FOOD 90 capsule 2   cloNIDine (CATAPRES) 0.2 MG tablet Take 1 tablet (0.2 mg total) by mouth 2 (two) times daily. For Blood Pressure 180 tablet 3   Colchicine 0.6 MG CAPS Take 1 capsule by mouth  daily. (Patient taking differently: Take 1 capsule by mouth daily as needed (gout flares.).) 90 capsule 2   dapagliflozin propanediol (FARXIGA) 10 MG TABS tablet Take 1 tablet (10 mg total) by mouth daily before breakfast. 90 tablet 3   DULoxetine (CYMBALTA) 30 MG capsule Take 1 capsule (30 mg total) by mouth 2 (two) times daily. 180 capsule 2   ferrous sulfate 325 (65 FE) MG tablet Take 325 mg by mouth in the morning.     fluocinolone (SYNALAR) 0.01 % external solution Place 3-5 drops in each ear canal twice daily 60 mL 5   fluticasone (FLONASE) 50 MCG/ACT nasal spray USE 2 SPRAYS IN BOTH  NOSTRILS DAILY AS NEEDED  FOR ALLERGIES OR RHINITIS 48 g 2   furosemide (LASIX) 40 MG tablet Take 1 tablet (40 mg total) by mouth every morning. 90 tablet 2   hydroxychloroquine (PLAQUENIL) 200 MG tablet Take 1 tablet (200 mg total) by mouth in the morning and at bedtime. 180 tablet 3   Multiple Vitamin (MULTIVITAMIN) capsule Take 1 capsule by mouth daily.     neomycin-polymyxin-hydrocortisone (CORTISPORIN) 3.5-10000-1 OTIC suspension PLACE 4 DROPS INTO AFFECTED EAR TWICE DAILY FOR 10 DAYS 10 mL 3   potassium chloride SA (KLOR-CON M) 20 MEQ tablet TAKE 1 TABLET BY MOUTH DAILY FOR POTASSIUM REPLACEMENT/  SUPPLEMENT 90 tablet 2   sildenafil (REVATIO) 20 MG tablet Take 2-5 pills at once, orally, with each sexual encounter 50 tablet 5   tamsulosin (FLOMAX) 0.4 MG CAPS capsule Take 1 capsule (0.4 mg total) by mouth at bedtime. 90 capsule 2   vitamin B-12 (CYANOCOBALAMIN) 500 MCG tablet Take 500 mcg by mouth daily.     No current facility-administered medications for this visit.   Allergies:  Ciprofloxacin, Definity [perflutren lipid microsphere], and Levofloxacin [levofloxacin]   ROS: No syncope.  Physical Exam: VS:  BP 130/62   Pulse (!) 59   Ht 6' (1.829 m)   Wt (!) 318 lb 12.8 oz (144.6 kg)   SpO2 100%   BMI 43.24 kg/m , BMI Body mass index is 43.24 kg/m.  Wt Readings from Last 3 Encounters:   11/23/22 (!) 318 lb 12.8 oz (144.6 kg)  11/16/22 (!) 317 lb (143.8 kg)  09/28/22 (!) 322 lb 12.8 oz (146.4 kg)    General: Patient appears comfortable at rest. HEENT: Conjunctiva and lids normal. Neck: Supple, no elevated JVP or carotid bruits. Lungs: Clear to auscultation, nonlabored breathing at rest. Cardiac: Regular rate and rhythm, no S3 or significant systolic murmur. Abdomen: Soft, bowel sounds present. Extremities: No pitting edema.  ECG:  An ECG dated 02/23/2022 was personally reviewed today and demonstrated:  Sinus bradycardia with IVCD.  Recent Labwork: 02/10/2022: B Natriuretic Peptide 59.4 09/28/2022: ALT 35; AST 34; BUN 29;  Creatinine, Ser 1.39; Potassium 5.0; Sodium 141 11/16/2022: Hemoglobin 13.6; Platelets 145; TSH 3.650     Component Value Date/Time   CHOL 110 09/28/2022 0854   TRIG 152 (H) 09/28/2022 0854   HDL 33 (L) 09/28/2022 0854   CHOLHDL 3.3 09/28/2022 0854   CHOLHDL 11.7 10/22/2011 1030   VLDL 39 10/22/2011 1030   LDLCALC 51 09/28/2022 0854    Other Studies Reviewed Today:  Echocardiogram 03/08/2022:  1. Left ventricular ejection fraction, by estimation, is 60 to 65%. The  left ventricle has normal function. Left ventricular endocardial border  not optimally defined to evaluate regional wall motion. There is mild left  ventricular hypertrophy. Left  ventricular diastolic parameters were normal.   2. Right ventricular systolic function is normal. The right ventricular  size is normal. Tricuspid regurgitation signal is inadequate for assessing  PA pressure.   3. The mitral valve is normal in structure. No evidence of mitral valve  regurgitation. No evidence of mitral stenosis.   4. The aortic valve was not well visualized. Aortic valve regurgitation  is not visualized. No aortic stenosis is present.   5. Aortic dilatation noted. There is mild dilatation of the ascending  aorta, measuring 38 mm.   6. The inferior vena cava is normal in size with  greater than 50%  respiratory variability, suggesting right atrial pressure of 3 mmHg.   7. Possible small PFO with mild left to right shunt.    Cardiac monitor March 2023: ZIO XT reviewed.  5 days, 9 hours analyzed.  Predominant rhythm is sinus with heart rate ranging from 48 bpm up to 109 bpm and average heart rate 60 bpm.   There were rare PACs including atrial couplets and triplets representing less than 1% total beats.  There were rare PVCs including ventricular couplets representing less than 1% total beats.  Ventricular bigeminy was also noted.   Episodes of SVT were noted, more specifically atrial flutter with variable conduction was noted approximately 17% of the time.  Average heart rate was in the 70s.  Longest episode lasted for 22 hours and 33 minutes.  Assessment and Plan:  [facility] Referral for Left Atrial Appendage Closure with Non-Valvular Atrial Fibrillation   Owyn Raulston. is a 71 y.o. male is being referred to the [facility] for evaluation for Left Atrial Appendage Closure with Watchman device for the management of stroke risk resulting form non-valvular atrial fibrillation.    Base upon Mr. Jerger history, he is felt to be a poor candidate for long-term anticoagulation because of  spontaneous subdural and subarachnoid hemorrhage following fall in the absence of any anticoagulation .  The patient has a HAS-BLED score of 4 indicating a Yearly Major Bleeding Risk of 8.7%.  His CHADS2-VASc Score is 5 with an unadjusted Ischemic Stroke Rate (% per year) of 7.2%.   His stroke risk necessitates a strategy of stroke prevention with either long-term oral anticoagulation or left atrial appendage occlusion therapy. We have discussed their bleeding risk in the context of their comorbid medical problems, as well as the rationale for referral for evaluation of Watchman left atrial appendage occlusion therapy. While the patient is at high long-term  bleeding risk, they may be appropriate for short-term anticoagulation. Based on this individual patient's stroke and bleeding risk, a shared decision has been made to refer the patient for consideration of Watchman left atrial appendage closure utilizing the [employer] shared decision tool.   1.  Paroxysmal atrial flutter with CHA2DS2-VASc score of 5.  As discussed above he is not a good candidate for long-term anticoagulation in light of spontaneous subdural and subarachnoid hemorrhages with fall requiring surgical intervention.  Plan to refer him for a Watchman device consultation.  He is otherwise a dramatic in terms of palpitations and is in sinus rhythm today.  2.  HFrecEF with LVEF 60 to 65% by echocardiogram in March of this year.  Continue Coreg, Farxiga, and Animator.  Medication Adjustments/Labs and Tests Ordered: Current medicines are reviewed at length with the patient today.  Concerns regarding medicines are outlined above.   Tests Ordered: Orders Placed This Encounter  Procedures   Ambulatory referral to Structural Heart/Valve Clinic (only at [facility])    Medication Changes: No orders of the defined types were placed in this encounter.   Disposition:  Follow up  6 months.  Signed, Jonelle Sidle, MD, [facility] 11/23/2022 2:49 PM    [facility] at [facility] [address] [place], [place] [address] Phone: [phone]; Fax: [phone]

## 2022-11-23 ENCOUNTER — Ambulatory Visit: Payer: No Typology Code available for payment source | Attending: Cardiology | Admitting: Cardiology

## 2022-11-23 ENCOUNTER — Other Ambulatory Visit: Payer: Self-pay | Admitting: Gastroenterology

## 2022-11-23 ENCOUNTER — Encounter: Payer: Self-pay | Admitting: Cardiology

## 2022-11-23 VITALS — BP 130/62 | HR 59 | Ht 72.0 in | Wt 318.8 lb

## 2022-11-23 DIAGNOSIS — I4892 Unspecified atrial flutter: Secondary | ICD-10-CM | POA: Diagnosis not present

## 2022-11-23 DIAGNOSIS — R197 Diarrhea, unspecified: Secondary | ICD-10-CM | POA: Diagnosis not present

## 2022-11-23 DIAGNOSIS — Z8679 Personal history of other diseases of the circulatory system: Secondary | ICD-10-CM | POA: Diagnosis not present

## 2022-11-23 DIAGNOSIS — D649 Anemia, unspecified: Secondary | ICD-10-CM | POA: Diagnosis not present

## 2022-11-23 NOTE — Patient Instructions (Addendum)
Medication Instructions:  Your physician recommends that you continue on your current medications as directed. Please refer to the Current Medication list given to you today.   Labwork: None today  Testing/Procedures: None today  Follow-Up: 6 months  Any Other Special Instructions Will Be Listed Below (If Applicable).    You have been referred to Dr.Cooper for evaluation for the watchman device. His office will call you to schedule an appointment.   If you need a refill on your cardiac medications before your next appointment, please call your pharmacy.

## 2022-11-24 ENCOUNTER — Telehealth: Payer: Self-pay

## 2022-11-24 NOTE — Telephone Encounter (Signed)
Per Dr. McDowell, called to arrange Watchman consult.  ° °Left message to call back. °

## 2022-11-24 NOTE — Telephone Encounter (Signed)
Scheduled the patient for Watchman consult with Dr. Excell Seltzer 12/16/2022. He was grateful for assistance.

## 2022-11-25 ENCOUNTER — Other Ambulatory Visit: Payer: Self-pay | Admitting: Family Medicine

## 2022-11-25 DIAGNOSIS — N400 Enlarged prostate without lower urinary tract symptoms: Secondary | ICD-10-CM

## 2022-11-25 DIAGNOSIS — M1A00X0 Idiopathic chronic gout, unspecified site, without tophus (tophi): Secondary | ICD-10-CM

## 2022-11-27 LAB — GI PROFILE, STOOL, PCR

## 2022-12-02 ENCOUNTER — Other Ambulatory Visit: Payer: Self-pay | Admitting: Family Medicine

## 2022-12-05 ENCOUNTER — Encounter: Payer: Self-pay | Admitting: *Deleted

## 2022-12-05 ENCOUNTER — Telehealth: Payer: Self-pay | Admitting: Gastroenterology

## 2022-12-05 ENCOUNTER — Other Ambulatory Visit: Payer: Self-pay | Admitting: *Deleted

## 2022-12-05 DIAGNOSIS — D649 Anemia, unspecified: Secondary | ICD-10-CM

## 2022-12-05 MED ORDER — CLENPIQ 10-3.5-12 MG-GM -GM/175ML PO SOLN: 1.0000 | ORAL | 0 refills | Status: DC

## 2022-12-05 NOTE — Telephone Encounter (Signed)
Mitchell R. I ordered an abdominal ultrasound but you entered a RUQ U/S.   I need abdominal u/s as requested because otherwise I want be able to evaluate for splenomegaly. Please correct order.

## 2022-12-06 ENCOUNTER — Other Ambulatory Visit: Payer: Self-pay | Admitting: *Deleted

## 2022-12-06 ENCOUNTER — Other Ambulatory Visit: Payer: Self-pay | Admitting: Gastroenterology

## 2022-12-06 DIAGNOSIS — D649 Anemia, unspecified: Secondary | ICD-10-CM

## 2022-12-06 DIAGNOSIS — R161 Splenomegaly, not elsewhere classified: Secondary | ICD-10-CM

## 2022-12-06 DIAGNOSIS — D696 Thrombocytopenia, unspecified: Secondary | ICD-10-CM

## 2022-12-06 NOTE — Telephone Encounter (Signed)
Correct order placed and spoke with CS to keep that same date.

## 2022-12-13 ENCOUNTER — Ambulatory Visit (HOSPITAL_COMMUNITY)
Admission: RE | Admit: 2022-12-13 | Discharge: 2022-12-13 | Disposition: A | Discharge status: Home / Self Care | Payer: Medicare Other | Source: Ambulatory Visit | Attending: Gastroenterology | Admitting: Gastroenterology

## 2022-12-13 ENCOUNTER — Ambulatory Visit (HOSPITAL_COMMUNITY): Payer: Medicare Other

## 2022-12-13 DIAGNOSIS — R161 Splenomegaly, not elsewhere classified: Secondary | ICD-10-CM | POA: Diagnosis not present

## 2022-12-13 DIAGNOSIS — D696 Thrombocytopenia, unspecified: Secondary | ICD-10-CM | POA: Insufficient documentation

## 2022-12-15 ENCOUNTER — Other Ambulatory Visit: Payer: Self-pay | Admitting: *Deleted

## 2022-12-15 DIAGNOSIS — D696 Thrombocytopenia, unspecified: Secondary | ICD-10-CM

## 2022-12-15 DIAGNOSIS — R161 Splenomegaly, not elsewhere classified: Secondary | ICD-10-CM

## 2022-12-16 ENCOUNTER — Ambulatory Visit: Payer: Medicare Other | Attending: Cardiovascular Disease | Admitting: Cardiovascular Disease

## 2022-12-16 ENCOUNTER — Encounter: Payer: Self-pay | Admitting: Cardiovascular Disease

## 2022-12-16 VITALS — BP 122/50 | HR 54 | Ht 73.0 in | Wt 319.2 lb

## 2022-12-16 DIAGNOSIS — Z01810 Encounter for preprocedural cardiovascular examination: Secondary | ICD-10-CM | POA: Diagnosis not present

## 2022-12-16 DIAGNOSIS — I4892 Unspecified atrial flutter: Secondary | ICD-10-CM

## 2022-12-16 LAB — BASIC METABOLIC PANEL
BUN/Creatinine Ratio: 33 — ABNORMAL HIGH (ref 10–24)
BUN: 44 mg/dL — ABNORMAL HIGH (ref 8–27)
CO2: 22 mmol/L (ref 20–29)
Calcium: 9.2 mg/dL (ref 8.6–10.2)
Chloride: 105 mmol/L (ref 96–106)
Creatinine, Ser: 1.33 mg/dL — ABNORMAL HIGH (ref 0.76–1.27)
Glucose: 172 mg/dL — ABNORMAL HIGH (ref 70–99)
Potassium: 4.8 mmol/L (ref 3.5–5.2)
Sodium: 141 mmol/L (ref 134–144)
eGFR: 58 mL/min/{1.73_m2} — ABNORMAL LOW (ref >59)

## 2022-12-16 NOTE — Patient Instructions (Addendum)
Medication Instructions:  Your physician recommends that you continue on your current medications as directed. Please refer to the Current Medication list given to you today.  *If you need a refill on your cardiac medications before your next appointment, please call your pharmacy*  Lab Work: BMET today If you have labs (blood work) drawn today and your tests are completely normal, you will receive your results only by: MyChart Message (if you have MyChart) OR A paper copy in the mail If you have any lab test that is abnormal or we need to change your treatment, we will call you to review the results.  Testing/Procedures: Watchman CT Your physician has requested that you have cardiac CT. Cardiac computed tomography (CT) is a painless test that uses an x-ray machine to take clear, detailed pictures of your heart. For further information please visit [email or web address] Please follow instruction sheet as given.  Follow-Up: At [facility], you and your health needs are our priority.  As part of our continuing mission to provide you with exceptional heart care, we have created designated Provider Care Teams.  These Care Teams include your primary Cardiologist (physician) and Advanced Practice Providers (APPs -  Physician Assistants and Nurse Practitioners) who all work together to provide you with the care you need, when you need it.   Your next appointment:   Structural Team will follow-up  The format for your next appointment:   In Person  Provider:   Tonny Bollman, MD    Other Instructions WATCHMAN CT INSTRUCTIONS: Your WATCHMAN CT will be scheduled at [facility]. You will be called to confirm date and time.  The day of your CT appointment, please enter [facility] through the [employer] and Children's Entrance (Entrance C off [facility] You may use the FREE valet parking offered at entrance C (encouraged to control the heart rate for the test). Check in at the  large round desk through the glass doors 30 minutes prior to your scheduled appointment.   Please follow these instructions carefully:  Hold all erectile dysfunction medications at least 3 days (72 hrs) prior to test.  On the Night Before the Test: Be sure to Drink plenty of water. Do not consume any caffeinated/decaffeinated beverages or chocolate 12 hours prior to your test. Do not take any antihistamines 12 hours prior to your test.  On the Day of the Test: You may take your regular medications prior to the test.  HOLD Furosemide morning of the test.      After the Test: Drink plenty of water. After receiving IV contrast, you may experience a mild flushed feeling. This is normal. On occasion, you may experience a mild rash up to 24 hours after the test. This is not dangerous. If this occurs, you can take Benadryl 25 mg and increase your fluid intake. If you experience trouble breathing, this can be serious. If it is severe call 911 IMMEDIATELY. If it is mild, please call our office. If you take any of these medications: Glipizide/Metformin, Avandament, Glucavance, please do not take 48 hours after completing test unless otherwise instructed.  Once we have confirmed authorization from your insurance company, you will be called to set up a date and time for your test.   For non-scheduling related questions/concerns about your CT scan, please contact the cardiac imaging nurses: Rockwell Alexandria, Cardiac Imaging Nurse Navigator Larey Brick, Cardiac Imaging Nurse Navigator [facility] Direct Office Dial: [phone]   For scheduling needs, including  cancellations and rescheduling, please call [place], [phone].  Karsten Fells, the Watchman Nurse Navigator, will call you after your CT once the [facility] Team has reviewed your imaging for an update on proceedings. [place]'s direct number is [phone] if you need assistance.   Important Information About  S[place]

## 2022-12-16 NOTE — Progress Notes (Signed)
Watchman Consult Note   Date:  12/16/2022   ID:  Mitchell Parker., DOB [birth year], MRN [id number]  PCP:  [name], MD  Cardiologist:  Dr Domenic Polite Primary Electrophysiologist: none Referring Physician: Dr Domenic Polite   CC: to discuss Watchman implant    History of Present Illness: Mitchell Parker. is a 71 y.o. male referred by Dr Domenic Polite for evaluation of atrial fibrillation and stroke prevention. He has paroxysmal atrial flutter.  The patient has been evaluated by their referring physician and is felt to be a poor candidate for long term [place] due to subdural hematoma.  He therefore presents today for Watchman evaluation.   The patient is here with his wife today.  He works part-time at [employer].  He has had 2 subdural hematomas, both traumatic related to prior falls.  He has never been on oral anticoagulation with the exception of remote warfarin exposure for treatment of a DVT.  The patient has been diagnosed with paroxysmal atrial flutter and he has not been anticoagulated because of his subdural hematomas.  He required bur hole evacuation in May of this year by Dr. Marcello Moores.  I discussed the patient's case with him today (see below for discussion).  The patient has had a good recovery from his surgery and has no residual neurologic issues.  Today, he denies symptoms of palpitations, chest pain, shortness of breath, orthopnea, PND, lower extremity edema, claudication, dizziness, presyncope, syncope, bleeding, or neurologic sequela. The patient is tolerating medications without difficulties and is otherwise without complaint today.    Past Medical History:  Diagnosis Date   Chronic diarrhea    CKD (chronic kidney disease) stage 3, GFR 30-59 ml/min (HCC)    COPD (chronic obstructive pulmonary disease) ([place])    Dysrhythmia 02/2022   paroxsymal SVT & aflutter 02/2022 XioXT   Essential hypertension    Gallstones    Gout    History of CHF (congestive heart failure)    History of  colonic polyps    History of DVT (deep vein thrombosis) 04/19/2012   BLE DVT 04/09/12   Kidney stones    Lumbar disc disease    Neuropathy    Nonischemic cardiomyopathy ([place]) 2003   Minor coronary atherosclerosis at cardiac catheterization 2003, LVEF initially 20% with normalization   Obesity    Orthostatic hypotension    RA (rheumatoid arthritis) (HCC)    Sleep apnea    Subarachnoid hemorrhage ([place]) 02/03/2022   Traumatic   Subdural hematoma ([place]) 02/03/2022   Traumatic   Syncope, vasovagal    Type 2 diabetes mellitus ([place])    Past Surgical History:  Procedure Laterality Date   BARIATRIC SURGERY  01/11/2018   BURR HOLE Right 05/04/2022   Procedure: BURR HOLES FOR Subdural Hematoma;  Surgeon: Vallarie Mare, MD;  Location: [facility];  Service: Neurosurgery;  Laterality: Right;   CHOLECYSTECTOMY  01/18/2012   Procedure: LAPAROSCOPIC CHOLECYSTECTOMY WITH INTRAOPERATIVE CHOLANGIOGRAM;  Surgeon: Joyice Faster. Cornett, MD;  Location: [facility];  Service: General;  Laterality: N/A;   EYE SURGERY     Film over retina was removed.   PILONIDAL CYST EXCISION  1997   TOE AMPUTATION     UMBILICAL HERNIA REPAIR     VARICOSE VEIN SURGERY     left leg     Current Outpatient Medications  Medication Sig Dispense Refill   acetaminophen (TYLENOL) 500 MG tablet Take 500 mg by mouth every 6 (six) hours as needed for mild pain.     allopurinol (  ZYLOPRIM) 300 MG tablet TAKE 1 TABLET BY MOUTH IN THE  MORNING 100 tablet 0   amLODipine-olmesartan (AZOR) 10-40 MG tablet Take 1 tablet by mouth daily. 90 tablet 3   atorvastatin (LIPITOR) 80 MG tablet TAKE 1 TABLET BY MOUTH DAILY AT  6 PM 90 tablet 0   calcitRIOL (ROCALTROL) 0.25 MCG capsule Take 1 capsule (0.25 mcg total) by mouth daily with breakfast. 90 capsule 3   calcium carbonate (OSCAL) 1500 (600 Ca) MG TABS tablet Take 600 mg of elemental calcium by mouth 2 (two) times daily with a meal.     carvedilol (COREG) 3.125 MG tablet Take 3.125 mg by mouth 2  (two) times daily with a meal. Per patient taking in the morning     carvedilol (COREG) 6.25 MG tablet 1/2 q am & 1 qpm (Patient taking differently: 6.25 mg. Per patient taking in the pm) 135 tablet 3   celecoxib (CELEBREX) 200 MG capsule TAKE 1 CAPSULE BY MOUTH DAILY  WITH FOOD 90 capsule 2   cloNIDine (CATAPRES) 0.2 MG tablet Take 1 tablet (0.2 mg total) by mouth 2 (two) times daily. For Blood Pressure 180 tablet 3   Colchicine 0.6 MG CAPS Take 1 capsule by mouth daily. (Patient taking differently: Take 1 capsule by mouth daily as needed (gout flares.).) 90 capsule 2   dapagliflozin propanediol (FARXIGA) 10 MG TABS tablet Take 1 tablet (10 mg total) by mouth daily before breakfast. 90 tablet 3   DULoxetine (CYMBALTA) 30 MG capsule TAKE 1 CAPSULE BY MOUTH TWICE  DAILY 200 capsule 0   ferrous sulfate 325 (65 FE) MG tablet Take 325 mg by mouth in the morning.     fluocinolone (SYNALAR) 0.01 % external solution Place 3-5 drops in each ear canal twice daily 60 mL 5   fluticasone (FLONASE) 50 MCG/ACT nasal spray USE 2 SPRAYS IN BOTH  NOSTRILS DAILY AS NEEDED  FOR ALLERGIES OR RHINITIS 48 g 2   furosemide (LASIX) 40 MG tablet TAKE 1 TABLET BY MOUTH IN THE  MORNING 100 tablet 0   hydroxychloroquine (PLAQUENIL) 200 MG tablet Take 1 tablet (200 mg total) by mouth in the morning and at bedtime. 180 tablet 3   Multiple Vitamin (MULTIVITAMIN) capsule Take 1 capsule by mouth daily.     neomycin-polymyxin-hydrocortisone (CORTISPORIN) 3.5-10000-1 OTIC suspension PLACE 4 DROPS INTO AFFECTED EAR TWICE DAILY FOR 10 DAYS 10 mL 3   potassium chloride SA (KLOR-CON M) 20 MEQ tablet TAKE 1 TABLET BY MOUTH DAILY FOR POTASSIUM REPLACEMENT/  SUPPLEMENT 90 tablet 2   sildenafil (REVATIO) 20 MG tablet Take 2-5 pills at once, orally, with each sexual encounter 50 tablet 5   Sod Picosulfate-Mag Ox-Cit Acd (CLENPIQ) 10-3.5-12 MG-GM -GM/175ML SOLN Take 1 kit by mouth as directed. 350 mL 0   tamsulosin (FLOMAX) 0.4 MG CAPS capsule  TAKE 1 CAPSULE BY MOUTH AT  BEDTIME 100 capsule 0   vitamin B-12 (CYANOCOBALAMIN) 500 MCG tablet Take 500 mcg by mouth daily.     No current facility-administered medications for this visit.    Allergies:   Ciprofloxacin, Definity [perflutren lipid microsphere], and Levofloxacin [levofloxacin]   Social History:  The patient  reports that he quit smoking about 27 years ago. His smoking use included cigarettes. He has never used smokeless tobacco. He reports that he does not drink alcohol and does not use drugs.   Family History:  The patient's  family history includes Diabetes in his brother and father; Heart disease in his father; Hypertension  in his father and mother; Stroke in his mother.    ROS:  Please see the history of present illness.   All other systems are reviewed and negative.    PHYSICAL EXAM: VS:  BP (!) 122/50   Pulse (!) 54   Ht _0  (1.854 m)   Wt (!) 319 lb 3.2 oz (144.8 kg)   SpO2 97%   BMI 42.11 kg/m  , BMI Body mass index is 42.11 kg/m. GEN: Well nourished, well developed, obese male in no acute distress  HEENT: normal  Neck: no JVD, carotid bruits, or masses Cardiac: RRR; no murmurs, rubs, or gallops, 1+ ankle edema  Respiratory:  clear to auscultation bilaterally, normal work of breathing GI: soft, nontender, nondistended, + BS MS: no deformity or atrophy  Skin: warm and dry with chronic stasis dermatitis of the legs Neuro:  Strength and sensation are intact Psych: euthymic mood, full affect  EKG:  EKG is not ordered today.   Recent Labs: 02/10/2022: B Natriuretic Peptide 59.4 09/28/2022: ALT 35; BUN 29; Creatinine, Ser 1.39; Potassium 5.0; Sodium 141 11/16/2022: Hemoglobin 13.6; Platelets 145; TSH 3.650    Lipid Panel     Component Value Date/Time   CHOL 110 09/28/2022 0854   TRIG 152 (H) 09/28/2022 0854   HDL 33 (L) 09/28/2022 0854   CHOLHDL 3.3 09/28/2022 0854   CHOLHDL 11.7 10/22/2011 1030   VLDL 39 10/22/2011 1030   LDLCALC 51 09/28/2022  0854     Wt Readings from Last 3 Encounters:  12/16/22 (!) 319 lb 3.2 oz (144.8 kg)  11/23/22 (!) 318 lb 12.8 oz (144.6 kg)  11/16/22 (!) 317 lb (143.8 kg)      Other studies Reviewed: Additional studies/ records that were reviewed today include:  Echo 03/08/2022: 1. Left ventricular ejection fraction, by estimation, is 60 to 65%. The  left ventricle has normal function. Left ventricular endocardial border  not optimally defined to evaluate regional wall motion. There is mild left  ventricular hypertrophy. Left  ventricular diastolic parameters were normal.   2. Right ventricular systolic function is normal. The right ventricular  size is normal. Tricuspid regurgitation signal is inadequate for assessing  PA pressure.   3. The mitral valve is normal in structure. No evidence of mitral valve  regurgitation. No evidence of mitral stenosis.   4. The aortic valve was not well visualized. Aortic valve regurgitation  is not visualized. No aortic stenosis is present.   5. Aortic dilatation noted. There is mild dilatation of the ascending  aorta, measuring 38 mm.   6. The inferior vena cava is normal in size with greater than 50%  respiratory variability, suggesting right atrial pressure of 3 mmHg.   7. Possible small PFO with mild left to right shunt.    ASSESSMENT AND PLAN:  1.  Paroxysmal atrial flutter I have seen Mack Alvidrez. is a 71 y.o. male in the office today who has been referred for a Watchman left atrial appendage closure device.  He has a history of paroxysmal atrial flutter.  His atrial flutter related risk scores are:   CHA2DS2-VASc Score = 5   This indicates a 7.2% annual risk of stroke. The patient's score is based upon: CHF History: 1 HTN History: 1 Diabetes History: 1 Stroke History: 0 Vascular Disease History: 1 Age Score: 1 Gender Score: 0      HAS-BLED score  Hypertension No  Abnormal renal and liver function (Dialysis, transplant, Cr >2.26 mg/dL  /Cirrhosis or  Bilirubin >2x Normal or AST/ALT/AP >3x Normal) No  Stroke No  Bleeding Yes  Labile INR (Unstable/high INR) No  Elderly (>65) Yes  Drugs or alcohol (? 8 drinks/week, anti-plt or NSAID) No   Unfortunately, He is not felt to be a long term anticoagulation candidate secondary to recurrent subdural bleeding.  The patients chart has been reviewed and I along with their referring cardiologist feel that they would be a candidate for short term oral anticoagulation.  Procedural risks for the Watchman implant have been reviewed with the patient including a 1% risk of stroke, 1% risk of perforation or pericardial effusion, 0.1% risk of device embolization.  Given the patient's poor candidacy for long-term oral anticoagulation, ability to tolerate short term oral anticoagulation, I have recommended the Watchman FLX left atrial appendage closure device through a shared decision making conversation with the patient.   Prior to the procedure, I would like to obtain a gated CT scan of the heart with contrast timed for PV/LA visualization.  The patient understands the final recommendations for watchman implantation will be based on anatomic findings from his CTA study.  He otherwise seems like an appropriate candidate.  With successful watchman implantation, I would anticipate apixaban 5 mg twice daily x 6 weeks, then clopidogrel alone through 6 months.  I spoke with Dr. Marcello Moores from neurosurgery and we discussed the patient's risk/benefit for the procedure.  Considering his annual stroke risk of approximately 7% without anticoagulation, the fact that he is not a candidate for long-term oral anticoagulation, we agree that watchman implantation is reasonable.  He does have some increased short-term bleeding risk of course with the postprocedural anticoagulation which we will try to limit by using single antiplatelet therapy after the 6 weeks of oral anticoagulation.  Current medicines are reviewed at length  with the patient today.   The patient does not have concerns regarding his medicines.  The following changes were made today:  none  Labs/ tests ordered today include:  Orders Placed This Encounter  Procedures   CT CARDIAC MORPH/PULM VEIN W/CM&W/O CA SCORE   Basic metabolic panel     Signed, Sherren Mocha, MD 12/16/2022  3:40 PM     [facility] [address] [place] [address] [phone] (office) [phone] (fax)

## 2022-12-18 ENCOUNTER — Other Ambulatory Visit: Payer: Self-pay | Admitting: Family Medicine

## 2022-12-27 ENCOUNTER — Telehealth: Payer: Self-pay | Admitting: *Deleted

## 2022-12-27 NOTE — Telephone Encounter (Signed)
PA approved via [facility]. Auth# [id number], DOS: Jan 16, 2023 - Apr 16, 2023

## 2022-12-29 ENCOUNTER — Ambulatory Visit: Payer: Medicare Other | Admitting: Family Medicine

## 2022-12-30 ENCOUNTER — Other Ambulatory Visit: Payer: Self-pay | Admitting: Family Medicine

## 2022-12-30 ENCOUNTER — Telehealth: Payer: Self-pay | Admitting: Family Medicine

## 2022-12-30 MED ORDER — PIOGLITAZONE HCL 30 MG PO TABS: 30.0000 mg | ORAL_TABLET | Freq: Every day | ORAL | 2 refills | Status: DC

## 2022-12-30 NOTE — Telephone Encounter (Signed)
I switched him to pioglitazone instead. He should take both until he runs out of the [place]

## 2023-01-02 NOTE — Telephone Encounter (Signed)
Lmtcb.

## 2023-01-04 ENCOUNTER — Ambulatory Visit (INDEPENDENT_AMBULATORY_CARE_PROVIDER_SITE_OTHER): Payer: Medicare Other | Admitting: Family Medicine

## 2023-01-04 ENCOUNTER — Telehealth (HOSPITAL_COMMUNITY): Payer: Self-pay | Admitting: Emergency Medicine

## 2023-01-04 ENCOUNTER — Encounter: Payer: Self-pay | Admitting: Family Medicine

## 2023-01-04 ENCOUNTER — Other Ambulatory Visit: Payer: Self-pay | Admitting: Family Medicine

## 2023-01-04 VITALS — BP 188/66 | HR 53 | Temp 97.1°F | Ht 73.0 in | Wt 313.8 lb

## 2023-01-04 DIAGNOSIS — E1122 Type 2 diabetes mellitus with diabetic chronic kidney disease: Secondary | ICD-10-CM

## 2023-01-04 DIAGNOSIS — N1832 Chronic kidney disease, stage 3b: Secondary | ICD-10-CM | POA: Diagnosis not present

## 2023-01-04 DIAGNOSIS — E1169 Type 2 diabetes mellitus with other specified complication: Secondary | ICD-10-CM

## 2023-01-04 DIAGNOSIS — M1A00X0 Idiopathic chronic gout, unspecified site, without tophus (tophi): Secondary | ICD-10-CM

## 2023-01-04 DIAGNOSIS — E782 Mixed hyperlipidemia: Secondary | ICD-10-CM

## 2023-01-04 DIAGNOSIS — E785 Hyperlipidemia, unspecified: Secondary | ICD-10-CM | POA: Diagnosis not present

## 2023-01-04 DIAGNOSIS — N400 Enlarged prostate without lower urinary tract symptoms: Secondary | ICD-10-CM

## 2023-01-04 DIAGNOSIS — I10 Essential (primary) hypertension: Secondary | ICD-10-CM | POA: Diagnosis not present

## 2023-01-04 LAB — LIPID PANEL

## 2023-01-04 LAB — BAYER DCA HB A1C WAIVED: HB A1C (BAYER DCA - WAIVED): 7.4 % — ABNORMAL HIGH (ref 4.8–5.6)

## 2023-01-04 MED ORDER — TIRZEPATIDE 2.5 MG/0.5ML ~~LOC~~ SOAJ: 2.5000 mg | SUBCUTANEOUS | 0 refills | Status: DC

## 2023-01-04 NOTE — Progress Notes (Signed)
Subjective:  Patient ID: Mitchell Parker., male    DOB: [birth year]  Age: 72 y.o. MRN: [id number]  CC: Medical Management of Chronic Issues   HPI Mitchell TELFORD Sr. presents forFollow-up of diabetesiPatient denies symptoms such as polyuria, polydipsia, excessive hunger, nausea No significant hypoglycemic spells noted. Medications reviewed. Mitchell Parker cost jumped $131 on Jan1. Pt reports taking them regularly without complication/adverse reaction being reported today.   GI checked him. Spleen is large & has fatty liver. Also anemic. Seeing hematology in a week.   A fib dx with cardiology. Being considered fro Watchman implant. This is meant toprevent blood clot formation in the heart due to concern for using anticoagulant. Cardiology to discuss with his neurosurgeon if he ccan go on thinners for two months after the implant.    h[employer]inners for the [employer] has a past medical history of Chronic diarrhea, CKD (chronic kidney disease) stage 3, GFR 30-59 ml/min (HCC), COPD (chronic obstructive pulmonary disease) (Mitchell Parker), Dysrhythmia (02/2022), Essential hypertension, Gallstones, Gout, History of CHF (congestive heart failure), History of colonic polyps, History of DVT (deep vein thrombosis) (04/19/2012), Kidney stones, Lumbar disc disease, Neuropathy, Nonischemic cardiomyopathy (Mitchell Parker) (2003), Obesity, Orthostatic hypotension, RA (rheumatoid arthritis) (Mitchell Parker), Sleep apnea, Subarachnoid hemorrhage (Mitchell Parker) (02/03/2022), Subdural hematoma (Mitchell Parker) (02/03/2022), Syncope, vasovagal, and Type 2 diabetes mellitus (Mitchell Parker).   He has a past surgical history that includes Umbilical hernia repair; Pilonidal cyst excision (1997); Varicose vein surgery; Cholecystectomy (01/18/2012); Bariatric Surgery (01/11/2018); Eye surgery; [facility] (Right, 05/04/2022); and Toe amputation.   His family history includes Diabetes in his brother and father; Heart disease in his father; Hypertension in his father and mother; Stroke in his  mother.He reports that he quit smoking about 28 years ago. His smoking use included cigarettes. He has never used smokeless tobacco. He reports that he does not drink alcohol and does not use drugs.  Current Outpatient Medications on File Prior to Visit  Medication Sig Dispense Refill   acetaminophen (TYLENOL) 500 MG tablet Take 500 mg by mouth every 6 (six) hours as needed for mild pain.     calcium carbonate (OSCAL) 1500 (600 Ca) MG TABS tablet Take 600 mg of elemental calcium by mouth 2 (two) times daily with a meal.     carvedilol (COREG) 3.125 MG tablet Take 3.125 mg by mouth 2 (two) times daily with a meal. Per patient taking in the morning     carvedilol (COREG) 6.25 MG tablet 1/2 q am & 1 qpm (Patient taking differently: 6.25 mg. Per patient taking in the pm) 135 tablet 3   celecoxib (CELEBREX) 200 MG capsule TAKE 1 CAPSULE BY MOUTH DAILY  WITH FOOD 90 capsule 2   Colchicine 0.6 MG CAPS Take 1 capsule by mouth daily. (Patient taking differently: Take 1 capsule by mouth daily as needed (gout flares.).) 90 capsule 2   ferrous sulfate 325 (65 FE) MG tablet Take 325 mg by mouth in the morning.     fluocinolone (SYNALAR) 0.01 % external solution Place 3-5 drops in each ear canal twice daily 60 mL 5   fluticasone (FLONASE) 50 MCG/ACT nasal spray USE 2 SPRAYS IN BOTH  NOSTRILS DAILY AS NEEDED  FOR ALLERGIES OR RHINITIS 48 g 2   Multiple Vitamin (MULTIVITAMIN) capsule Take 1 capsule by mouth daily.     neomycin-polymyxin-hydrocortisone (CORTISPORIN) 3.5-10000-1 OTIC suspension PLACE 4 DROPS INTO AFFECTED EAR TWICE DAILY FOR 10 DAYS 10 mL 3   sildenafil (REVATIO) 20 MG tablet Take 2-5 pills at once,  orally, with each sexual encounter 50 tablet 5   Sod Picosulfate-Mag Ox-Cit Acd (CLENPIQ) 10-3.5-12 MG-GM -GM/175ML SOLN Take 1 kit by mouth as directed. 350 mL 0   vitamin B-12 (CYANOCOBALAMIN) 500 MCG tablet Take 500 mcg by mouth daily.     No current facility-administered medications on file prior to  visit.    ROS Review of Systems  Constitutional:  Negative for fever.  Respiratory:  Negative for shortness of breath.   Cardiovascular:  Negative for chest pain.  Musculoskeletal:  Negative for arthralgias.  Skin:  Negative for rash.    Objective:  BP (!) 188/66   Pulse (!) 53   Temp (!) 97.1 F (36.2 C)   Ht 6\' 1"  (1.854 m)   Wt (!) 313 lb 12.8 oz (142.3 kg)   SpO2 98%   BMI 41.40 kg/m   BP Readings from Last 3 Encounters:  01/04/23 (!) 188/66  12/16/22 (!) 122/50  11/23/22 130/62    Wt Readings from Last 3 Encounters:  01/04/23 (!) 313 lb 12.8 oz (142.3 kg)  12/16/22 (!) 319 lb 3.2 oz (144.8 kg)  11/23/22 (!) 318 lb 12.8 oz (144.6 kg)     Physical Exam Vitals reviewed.  Constitutional:      Appearance: He is well-developed.  HENT:     Head: Normocephalic and atraumatic.     Right Ear: External ear normal.     Left Ear: External ear normal.     Mouth/Throat:     Pharynx: No oropharyngeal exudate or posterior oropharyngeal erythema.  Eyes:     Pupils: Pupils are equal, round, and reactive to light.  Cardiovascular:     Rate and Rhythm: Normal rate and regular rhythm.     Heart sounds: No murmur heard. Pulmonary:     Effort: No respiratory distress.     Breath sounds: Normal breath sounds.  Musculoskeletal:     Cervical back: Normal range of motion and neck supple.  Neurological:     Mental Status: He is alert and oriented to person, place, and time.       Assessment & Plan:   Mitchell Parker was seen today for medical management of chronic issues.  Diagnoses and all orders for this visit:  Type 2 diabetes mellitus with stage 3b chronic kidney disease, without long-term current use of insulin (HCC) -     Microalbumin / creatinine urine ratio -     Bayer DCA Hb A1c Waived  Primary hypertension -     CBC with Differential/Platelet -     CMP14+EGFR  Hyperlipidemia associated with type 2 diabetes mellitus (Mitchell Parker) -     Lipid panel  Other orders -      tirzepatide (MOUNJARO) 2.5 MG/0.5ML Pen; Inject 2.5 mg into the skin once a week.      [name] Rua Sr. "bill" start on tirzepatide. I am also having him maintain his multivitamin, calcium carbonate, vitamin B-12, acetaminophen, fluticasone, ferrous sulfate, sildenafil, celecoxib, Colchicine, fluocinolone, neomycin-polymyxin-hydrocortisone, carvedilol, Clenpiq, and carvedilol.  Meds ordered this encounter  Medications   tirzepatide (MOUNJARO) 2.5 MG/0.5ML Pen    Sig: Inject 2.5 mg into the skin once a week.    Dispense:  6 mL    Refill:  0     Follow-up: Return in about 3 months (around 04/05/2023).  [name], M.D.

## 2023-01-04 NOTE — Telephone Encounter (Signed)
Attempted to call patient regarding upcoming cardiac CT appointment. °Left message on voicemail with name and callback number °Recia Sons RN Navigator Cardiac Imaging °[facility] °[phone] Office °[phone] Cell ° °

## 2023-01-04 NOTE — Telephone Encounter (Signed)
Reaching out to patient to offer assistance regarding upcoming cardiac imaging study; pt verbalizes understanding of appt date/time, parking situation and where to check in, pre-test NPO status and medications ordered, and verified current allergies; name and call back number provided for further questions should they arise Marchia Bond RN Navigator Cardiac Imaging [facility] [phone] office [phone] cell  Arrival [address] entrance  Daily meds except lasix No caffeine Aware contrast

## 2023-01-04 NOTE — Telephone Encounter (Signed)
Patient in office this morning

## 2023-01-05 ENCOUNTER — Other Ambulatory Visit: Payer: Self-pay | Admitting: Family Medicine

## 2023-01-05 ENCOUNTER — Ambulatory Visit (HOSPITAL_COMMUNITY)
Admission: RE | Admit: 2023-01-05 | Discharge: 2023-01-05 | Disposition: A | Discharge status: Home / Self Care | Payer: Medicare Other | Source: Ambulatory Visit | Attending: Cardiovascular Disease | Admitting: Cardiovascular Disease

## 2023-01-05 DIAGNOSIS — I4892 Unspecified atrial flutter: Secondary | ICD-10-CM | POA: Diagnosis not present

## 2023-01-05 DIAGNOSIS — I2510 Atherosclerotic heart disease of native coronary artery without angina pectoris: Secondary | ICD-10-CM | POA: Diagnosis not present

## 2023-01-05 DIAGNOSIS — Z01810 Encounter for preprocedural cardiovascular examination: Secondary | ICD-10-CM | POA: Insufficient documentation

## 2023-01-05 LAB — CBC WITH DIFFERENTIAL/PLATELET
Basophils Absolute: 0 10*3/uL (ref 0.0–0.2)
Basos: 1 %
EOS (ABSOLUTE): 0.3 10*3/uL (ref 0.0–0.4)
Eos: 7 %
Hematocrit: 42.1 % (ref 37.5–51.0)
Hemoglobin: 14.3 g/dL (ref 13.0–17.7)
Immature Grans (Abs): 0 10*3/uL (ref 0.0–0.1)
Immature Granulocytes: 0 %
Lymphocytes Absolute: 1 10*3/uL (ref 0.7–3.1)
Lymphs: 22 %
MCH: 32 pg (ref 26.6–33.0)
MCHC: 34 g/dL (ref 31.5–35.7)
MCV: 94 fL (ref 79–97)
Monocytes Absolute: 0.6 10*3/uL (ref 0.1–0.9)
Monocytes: 12 %
Neutrophils Absolute: 2.6 10*3/uL (ref 1.4–7.0)
Neutrophils: 58 %
Platelets: 126 10*3/uL — ABNORMAL LOW (ref 150–450)
RBC: 4.47 x10E6/uL (ref 4.14–5.80)
RDW: 12.6 % (ref 11.6–15.4)
WBC: 4.5 10*3/uL (ref 3.4–10.8)

## 2023-01-05 LAB — CMP14+EGFR
ALT: 60 IU/L — ABNORMAL HIGH (ref 0–44)
AST: 48 IU/L — ABNORMAL HIGH (ref 0–40)
Albumin/Globulin Ratio: 1.7 (ref 1.2–2.2)
Albumin: 4.3 g/dL (ref 3.9–4.9)
Alkaline Phosphatase: 62 IU/L (ref 44–121)
BUN/Creatinine Ratio: 21 (ref 10–24)
BUN: 25 mg/dL (ref 8–27)
Bilirubin Total: 0.5 mg/dL (ref 0.0–1.2)
CO2: 23 mmol/L (ref 20–29)
Calcium: 9.2 mg/dL (ref 8.6–10.2)
Chloride: 105 mmol/L (ref 96–106)
Creatinine, Ser: 1.19 mg/dL (ref 0.76–1.27)
Globulin, Total: 2.6 g/dL (ref 1.5–4.5)
Glucose: 142 mg/dL — ABNORMAL HIGH (ref 70–99)
Potassium: 4.8 mmol/L (ref 3.5–5.2)
Sodium: 143 mmol/L (ref 134–144)
Total Protein: 6.9 g/dL (ref 6.0–8.5)
eGFR: 66 mL/min/{1.73_m2} (ref >59)

## 2023-01-05 LAB — MICROALBUMIN / CREATININE URINE RATIO
Creatinine, Urine: 75 mg/dL
Microalb/Creat Ratio: 433 mg/g creat — ABNORMAL HIGH (ref 0–29)
Microalbumin, Urine: 325.1 ug/mL

## 2023-01-05 LAB — LIPID PANEL
Chol/HDL Ratio: 3.1 ratio (ref 0.0–5.0)
Cholesterol, Total: 125 mg/dL (ref 100–199)
HDL: 40 mg/dL (ref >39)
LDL Chol Calc (NIH): 62 mg/dL (ref 0–99)
Triglycerides: 126 mg/dL (ref 0–149)
VLDL Cholesterol Cal: 23 mg/dL (ref 5–40)

## 2023-01-05 MED ORDER — IOHEXOL 350 MG/ML SOLN
100.0000 mL | Freq: Once | INTRAVENOUS | Status: AC | PRN
  Administered 2023-01-05: 100 mL via INTRAVENOUS

## 2023-01-05 NOTE — Progress Notes (Signed)
Hello Mitchell Parker, ° °Your lab result is normal and/or stable.Some minor variations that are not significant are commonly marked abnormal, but do not represent any medical problem for you. ° °Best regards, °Breydan Shillingburg, M.D.

## 2023-01-09 ENCOUNTER — Telehealth: Payer: Self-pay

## 2023-01-10 ENCOUNTER — Other Ambulatory Visit: Payer: Self-pay

## 2023-01-10 ENCOUNTER — Telehealth: Payer: Self-pay

## 2023-01-10 DIAGNOSIS — S065XAA Traumatic subdural hemorrhage with loss of consciousness status unknown, initial encounter: Secondary | ICD-10-CM

## 2023-01-10 DIAGNOSIS — I4892 Unspecified atrial flutter: Secondary | ICD-10-CM

## 2023-01-10 NOTE — Telephone Encounter (Signed)
Anatomy looks appropriate for Watchman Flx implantation. thanks

## 2023-01-10 NOTE — Telephone Encounter (Signed)
Yes - agree with plan. thx

## 2023-01-10 NOTE — Telephone Encounter (Signed)
Per Dr. Burt Knack, informed patient his anatomy by CT is suitable for Watchman implant.  The patient wishes to proceed with LAAO on 02/02/2023. Scheduled him for pre-procedure visit 01/25/2023. He understands he will update labs, get an EKG, and be given soap and instructions for the procedure. He was grateful for call and agreed with plan.    The patient is not currently on an a/c or antiplatelet. He is scheduled for a colonoscopy 01/16/23.  Per Dr. Antionette Char 12/16/22 consult note,  "With successful watchman implantation, I would anticipate apixaban 5 mg twice daily x 6 weeks, then clopidogrel alone through 6 months. I spoke with Dr. Marcello Moores from neurosurgery and we discussed the patient's risk/benefit for the procedure. Considering his annual stroke risk of approximately 7% without anticoagulation, the fact that he is not a candidate for long-term oral anticoagulation, we agree that watchman implantation is reasonable. He does have some increased short-term bleeding risk of course with the postprocedural anticoagulation which we will try to limit by using single antiplatelet therapy after the 6 weeks of oral anticoagulation."  Will confirm with Dr. Burt Knack the patient will start Eliquis 5 mg BID 5 days prior to procedure (rx to be given at pre-procedure visit).

## 2023-01-12 ENCOUNTER — Encounter (HOSPITAL_COMMUNITY)
Admission: RE | Admit: 2023-01-12 | Discharge: 2023-01-12 | Disposition: A | Discharge status: Home / Self Care | Payer: Medicare Other | Source: Ambulatory Visit | Attending: Internal Medicine | Admitting: Internal Medicine

## 2023-01-12 ENCOUNTER — Encounter (HOSPITAL_COMMUNITY): Payer: Self-pay

## 2023-01-12 ENCOUNTER — Other Ambulatory Visit: Payer: Self-pay

## 2023-01-13 ENCOUNTER — Inpatient Hospital Stay: Payer: Medicare Other

## 2023-01-13 ENCOUNTER — Inpatient Hospital Stay: Payer: Medicare Other | Attending: Hematology | Admitting: Hematology

## 2023-01-13 VITALS — BP 144/59 | HR 60 | Temp 98.6°F | Resp 20 | Ht 71.26 in | Wt 322.3 lb

## 2023-01-13 DIAGNOSIS — D696 Thrombocytopenia, unspecified: Secondary | ICD-10-CM | POA: Diagnosis not present

## 2023-01-13 DIAGNOSIS — R161 Splenomegaly, not elsewhere classified: Secondary | ICD-10-CM | POA: Insufficient documentation

## 2023-01-13 DIAGNOSIS — M069 Rheumatoid arthritis, unspecified: Secondary | ICD-10-CM | POA: Insufficient documentation

## 2023-01-13 LAB — CBC WITH DIFFERENTIAL/PLATELET
Abs Immature Granulocytes: 0.01 10*3/uL (ref 0.00–0.07)
Basophils Absolute: 0 10*3/uL (ref 0.0–0.1)
Basophils Relative: 1 %
Eosinophils Absolute: 0.3 10*3/uL (ref 0.0–0.5)
Eosinophils Relative: 6 %
HCT: 42.5 % (ref 39.0–52.0)
Hemoglobin: 14 g/dL (ref 13.0–17.0)
Immature Granulocytes: 0 %
Lymphocytes Relative: 16 %
Lymphs Abs: 0.9 10*3/uL (ref 0.7–4.0)
MCH: 32.5 pg (ref 26.0–34.0)
MCHC: 32.9 g/dL (ref 30.0–36.0)
MCV: 98.6 fL (ref 80.0–100.0)
Monocytes Absolute: 0.6 10*3/uL (ref 0.1–1.0)
Monocytes Relative: 11 %
Neutro Abs: 3.6 10*3/uL (ref 1.7–7.7)
Neutrophils Relative %: 66 %
Platelets: 131 10*3/uL — ABNORMAL LOW (ref 150–400)
RBC: 4.31 MIL/uL (ref 4.22–5.81)
RDW: 13.4 % (ref 11.5–15.5)
WBC: 5.5 10*3/uL (ref 4.0–10.5)
nRBC: 0 % (ref 0.0–0.2)

## 2023-01-13 LAB — HEPATITIS B SURFACE ANTIBODY,QUALITATIVE: Hep B S Ab: REACTIVE — AB

## 2023-01-13 LAB — HEPATITIS B CORE ANTIBODY, TOTAL: Hep B Core Total Ab: REACTIVE — AB

## 2023-01-13 LAB — FOLATE: Folate: >40 ng/mL (ref >5.9)

## 2023-01-13 LAB — VITAMIN B12: Vitamin B-12: 2890 pg/mL — ABNORMAL HIGH (ref 180–914)

## 2023-01-13 LAB — HEPATITIS C ANTIBODY: HCV Ab: NONREACTIVE

## 2023-01-13 LAB — LACTATE DEHYDROGENASE: LDH: 133 U/L (ref 98–192)

## 2023-01-13 LAB — IRON AND TIBC
Iron: 104 ug/dL (ref 45–182)
Saturation Ratios: 30 % (ref 17.9–39.5)
TIBC: 345 ug/dL (ref 250–450)
UIBC: 241 ug/dL

## 2023-01-13 LAB — C-REACTIVE PROTEIN: CRP: 0.6 mg/dL (ref <1.0)

## 2023-01-13 LAB — RETICULOCYTES
Immature Retic Fract: 7.6 % (ref 2.3–15.9)
RBC.: 4.29 MIL/uL (ref 4.22–5.81)
Retic Count, Absolute: 42.9 10*3/uL (ref 19.0–186.0)
Retic Ct Pct: 1 % (ref 0.4–3.1)

## 2023-01-13 LAB — SEDIMENTATION RATE: Sed Rate: 8 mm/hr (ref 0–16)

## 2023-01-13 LAB — HEPATITIS B SURFACE ANTIGEN: Hepatitis B Surface Ag: NONREACTIVE

## 2023-01-13 LAB — FERRITIN: Ferritin: 33 ng/mL (ref 24–336)

## 2023-01-13 NOTE — Patient Instructions (Addendum)
[place]  Discharge Instructions  You were seen and examined today by Dr. Delton Coombes.  You were referred to Dr. Delton Coombes for decreased platelet count. Platelets are the blood cell that help with clotting. Your have been low fairly consistently since 2013. Platelets can be low related to medications or a number of other reasons.  Dr. Delton Coombes has recommended additional labs today for further evaluation.  Follow-up as scheduled.  Thank you for choosing [facility] to provide your oncology and hematology care.   To afford each patient quality time with our provider, please arrive at least 15 minutes before your scheduled appointment time. You may need to reschedule your appointment if you arrive late (10 or more minutes). Arriving late affects you and other patients whose appointments are after yours.  Also, if you miss three or more appointments without notifying the office, you may be dismissed from the clinic at the provider's discretion.    Again, thank you for choosing [facility].  Our hope is that these requests will decrease the amount of time that you wait before being seen by our physicians.   If you have a lab appointment with the [facility] please come in thru the Main Entrance and check in at the main information desk.           _____________________________________________________________  Should you have questions after your visit to [facility], please contact our office at [phone] and follow the prompts.  Our office hours are 8:00 a.m. to 4:30 p.m. Monday - Thursday and 8:00 a.m. to 2:30 p.m. Friday.  Please note that voicemails left after 4:00 p.m. may not be returned until the following business day.  We are closed weekends and all major holidays.  You do have access to a nurse 24-7, just call the main number to the clinic [phone] and do not press any options, hold on the line  and a nurse will answer the phone.    For prescription refill requests, have your pharmacy contact our office and allow 72 hours.    Masks are optional in the cancer centers. If you would like for your care team to wear a mask while they are taking care of you, please let them know. You may have one support person who is at least 70 years old accompany you for your appointments.

## 2023-01-13 NOTE — Progress Notes (Signed)
CONSULT NOTE  Patient Care Team: [name], MD as PCP - General (Family Medicine) Satira Sark, MD as PCP - Cardiology (Cardiology) Vallarie Mare, MD as Consulting Physician (Neurosurgery) Derek Jack, MD as Medical Oncologist (Hematology)  CHIEF COMPLAINTS/PURPOSE OF CONSULTATION:  Decreased platelet count and splenomegaly  HISTORY OF PRESENTING ILLNESS:  Mitchell Rua Sr. 72 y.o. male is seen in consultation today at the request of Neil Crouch, PA-C.  CBC on 01/04/2022 showed platelet count 126.  He had mild to moderate thrombocytopenia since October 2013.  He does report easy bruising on the forearms and has left nostril bleed once a month.  He has rheumatoid arthritis since 2010 affecting hand joints and is on Plaquenil since then which is not entirely helping.  He also had a history of unprovoked pulmonary embolism and left leg DVT and took 5 years of warfarin.  He lives at home with his wife.  He is semiretired and works at [employer].  An ultrasound of his abdomen on 12/13/2022 showed fatty liver with spleen enlarged at 13.4 cm.  MEDICAL HISTORY:  Past Medical History:  Diagnosis Date   Chronic diarrhea    CKD (chronic kidney disease) stage 3, GFR 30-59 ml/min (HCC)    COPD (chronic obstructive pulmonary disease) ([place])    Dysrhythmia 02/2022   paroxsymal SVT & aflutter 02/2022 XioXT   Essential hypertension    Gallstones    Gout    History of CHF (congestive heart failure)    History of colonic polyps    History of DVT (deep vein thrombosis) 04/19/2012   BLE DVT 04/09/12   Kidney stones    Lumbar disc disease    Neuropathy    Nonischemic cardiomyopathy ([place]) 2003   Minor coronary atherosclerosis at cardiac catheterization 2003, LVEF initially 20% with normalization   Obesity    Orthostatic hypotension    RA (rheumatoid arthritis) (HCC)    Sleep apnea    Subarachnoid hemorrhage ([place]) 02/03/2022   Traumatic   Subdural hematoma ([place])  02/03/2022   Traumatic   Syncope, vasovagal    Type 2 diabetes mellitus ([place])     SURGICAL HISTORY: Past Surgical History:  Procedure Laterality Date   BARIATRIC SURGERY  01/11/2018   BURR HOLE Right 05/04/2022   Procedure: BURR HOLES FOR Subdural Hematoma;  Surgeon: Vallarie Mare, MD;  Location: [facility];  Service: Neurosurgery;  Laterality: Right;   CHOLECYSTECTOMY  01/18/2012   Procedure: LAPAROSCOPIC CHOLECYSTECTOMY WITH INTRAOPERATIVE CHOLANGIOGRAM;  Surgeon: Joyice Faster. Cornett, MD;  Location: [facility];  Service: General;  Laterality: N/A;   EYE SURGERY Right    Film over retina was removed.   PILONIDAL CYST EXCISION  1997   TOE AMPUTATION Right    3 toes on right foot- big toe and next 2   UMBILICAL HERNIA REPAIR     VARICOSE VEIN SURGERY     left leg    SOCIAL HISTORY: Social History   Socioeconomic History   Marital status: Married    Spouse name: Mitchell Schmid   Number of children: 2   Years of education: Not on file   Highest education level: Some college, no degree  Occupational History   Occupation: retired  Tobacco Use   Smoking status: Former    Packs/day: 2.00    Years: 25.00    Total pack years: 50.00    Types: Cigarettes    Quit date: 12/26/1994    Years since quitting: 28.0   Smokeless tobacco: Never  Vaping Use   Vaping Use: Never used  Substance and Sexual Activity   Alcohol use: No   Drug use: No   Sexual activity: Not Currently  Other Topics Concern   Not on file  Social History Narrative   Lives at home with wife, and has two adult children. Still ambulatory, no cane or walker.    Works 30 hours per week at [employer]   Has VA benefits - goes to [place] once or twice a year for vision, hearing, screenings, vaccines, etc.   Social Determinants of Health   Financial Resource Strain: Low Risk  (10/05/2021)   Overall Financial Resource Strain (CARDIA)    Difficulty of Paying Living Expenses: Not hard at all  Food Insecurity: No Food Insecurity  (10/05/2021)   Hunger Vital Sign    Worried About Running Out of Food in the Last Year: Never true    Ran Out of Food in the Last Year: Never true  Transportation Needs: No Transportation Needs (10/05/2021)   PRAPARE - Administrator, Civil Service (Medical): No    Lack of Transportation (Non-Medical): No  Physical Activity: Sufficiently Active (10/05/2021)   Exercise Vital Sign    Days of Exercise per Week: 5 days    Minutes of Exercise per Session: 60 min  Stress: No Stress Concern Present (10/05/2021)   [employer] of Occupational Health - Occupational Stress Questionnaire    Feeling of Stress : Not at all  Social Connections: Moderately Isolated (10/05/2021)   Social Connection and Isolation Panel [NHANES]    Frequency of Communication with Friends and Family: Once a week    Frequency of Social Gatherings with Friends and Family: Once a week    Attends Religious Services: Never    Database administrator or Organizations: Yes    Attends Engineer, structural: More than 4 times per year    Marital Status: Married  Catering manager Violence: Not At Risk (10/05/2021)   Humiliation, Afraid, Rape, and Kick questionnaire    Fear of Current or Ex-Partner: No    Emotionally Abused: No    Physically Abused: No    Sexually Abused: No    FAMILY HISTORY: Family History  Problem Relation Age of Onset   Hypertension Mother    Stroke Mother    Diabetes Father    Hypertension Father    Heart disease Father    Diabetes Brother    Colon cancer Neg Hx     ALLERGIES:  is allergic to ciprofloxacin, definity [perflutren lipid microsphere], and levofloxacin [levofloxacin].  MEDICATIONS:  Current Outpatient Medications  Medication Sig Dispense Refill   acetaminophen (TYLENOL) 500 MG tablet Take 500 mg by mouth every 6 (six) hours as needed for mild pain.     allopurinol (ZYLOPRIM) 300 MG tablet TAKE 1 TABLET BY MOUTH IN THE  MORNING 100 tablet 0   amLODipine  (NORVASC) 10 MG tablet Take 10 mg by mouth in the morning.     amLODipine-olmesartan (AZOR) 10-40 MG tablet TAKE 1 TABLET BY MOUTH DAILY 100 tablet 0   atorvastatin (LIPITOR) 80 MG tablet TAKE 1 TABLET BY MOUTH DAILY AT  6 PM 100 tablet 0   calcitRIOL (ROCALTROL) 0.25 MCG capsule TAKE 1 CAPSULE BY MOUTH DAILY  WITH BREAKFAST 100 capsule 0   calcium carbonate (OSCAL) 1500 (600 Ca) MG TABS tablet Take 600 mg of elemental calcium by mouth 2 (two) times daily with a meal.     carvedilol (COREG) 3.125 MG  tablet Take 3.125 mg by mouth in the morning.     carvedilol (COREG) 6.25 MG tablet 1/2 q am & 1 qpm (Patient taking differently: Take 6.25 mg by mouth every evening.) 135 tablet 3   celecoxib (CELEBREX) 200 MG capsule TAKE 1 CAPSULE BY MOUTH DAILY  WITH FOOD 90 capsule 2   cloNIDine (CATAPRES) 0.2 MG tablet TAKE 1 TABLET BY MOUTH TWICE  DAILY FOR BLOOD PRESSURE (Patient taking differently: Take 0.2 mg by mouth in the morning.) 200 tablet 0   Colchicine 0.6 MG CAPS Take 1 capsule by mouth daily. 90 capsule 2   DULoxetine (CYMBALTA) 30 MG capsule TAKE 1 CAPSULE BY MOUTH TWICE  DAILY 200 capsule 0   ferrous sulfate 325 (65 FE) MG tablet Take 325 mg by mouth in the morning.     fluocinolone (SYNALAR) 0.01 % external solution Place 3-5 drops in each ear canal twice daily (Patient taking differently: 3-5 drops 2 (two) times daily as needed (irritation/infection.). Place 3-5 drops in each ear canal twice daily) 60 mL 5   fluticasone (FLONASE) 50 MCG/ACT nasal spray USE 2 SPRAYS IN BOTH  NOSTRILS DAILY AS NEEDED  FOR ALLERGIES OR RHINITIS 48 g 2   folic acid (FOLVITE) 1 MG tablet Take 1 mg by mouth in the morning.     furosemide (LASIX) 40 MG tablet TAKE 1 TABLET BY MOUTH IN THE  MORNING 100 tablet 0   hydroxychloroquine (PLAQUENIL) 200 MG tablet TAKE 1 TABLET BY MOUTH IN THE  MORNING AND AT BEDTIME 200 tablet 0   Multiple Vitamin (MULTIVITAMIN WITH MINERALS) TABS tablet Take 1 tablet by mouth in the morning.      neomycin-polymyxin-hydrocortisone (CORTISPORIN) 3.5-10000-1 OTIC suspension PLACE 4 DROPS INTO AFFECTED EAR TWICE DAILY FOR 10 DAYS (Patient taking differently: Place 4 drops into both ears 2 (two) times daily as needed (irritation/infection.). PLACE 4 DROPS INTO AFFECTED EAR TWICE DAILY FOR 10 DAYS) 10 mL 3   pioglitazone (ACTOS) 30 MG tablet TAKE 1 TABLET BY MOUTH DAILY 100 tablet 0   potassium chloride SA (KLOR-CON M) 20 MEQ tablet TAKE 1 TABLET BY MOUTH DAILY FOR POTASSIUM REPLACEMENT/  SUPPLEMENT 100 tablet 0   sildenafil (REVATIO) 20 MG tablet Take 2-5 pills at once, orally, with each sexual encounter 50 tablet 5   Sod Picosulfate-Mag Ox-Cit Acd (CLENPIQ) 10-3.5-12 MG-GM -GM/175ML SOLN Take 1 kit by mouth as directed. 350 mL 0   tamsulosin (FLOMAX) 0.4 MG CAPS capsule TAKE 1 CAPSULE BY MOUTH AT  BEDTIME 100 capsule 0   tirzepatide (MOUNJARO) 2.5 MG/0.5ML Pen Inject 2.5 mg into the skin once a week. (Patient taking differently: Inject 2.5 mg into the skin every Sunday.) 6 mL 0   vitamin B-12 (CYANOCOBALAMIN) 500 MCG tablet Take 500 mcg by mouth in the morning.     No current facility-administered medications for this visit.    REVIEW OF SYSTEMS:   Constitutional: Denies fevers, chills or abnormal night sweats Eyes: Denies blurriness of vision, double vision or watery eyes Ears, nose, mouth, throat, and face: Denies mucositis or sore throat Respiratory: Denies cough, dyspnea or wheezes Cardiovascular: Denies palpitation, chest discomfort or lower extremity swelling Gastrointestinal:  Denies nausea, heartburn or change in bowel habits.  Positive for diarrhea. Skin: Denies abnormal skin rashes Lymphatics: Denies new lymphadenopathy or easy bruising Neurological:Denies numbness, tingling or new weaknesses Behavioral/Psych: Mood is stable, no new changes  All other systems were reviewed with the patient and are negative.  PHYSICAL EXAMINATION: ECOG PERFORMANCE STATUS:  1 - Symptomatic but  completely ambulatory  Vitals:   01/13/23 0837  BP: (!) 144/59  Pulse: 60  Resp: 20  Temp: 98.6 F (37 C)  SpO2: 98%   Filed Weights   01/13/23 0837  Weight: (!) 322 lb 5 oz (146.2 kg)    GENERAL:alert, no distress and comfortable SKIN: skin color, texture, turgor are normal, no rashes or significant lesions EYES: normal, conjunctiva are pink and non-injected, sclera clear OROPHARYNX:no exudate, no erythema and lips, buccal mucosa, and tongue normal  NECK: supple, thyroid normal size, non-tender, without nodularity LYMPH:  no palpable lymphadenopathy in the cervical, axillary or inguinal LUNGS: clear to auscultation and percussion with normal breathing effort HEART: regular rate & rhythm and no murmurs and no lower extremity edema ABDOMEN:abdomen soft, non-tender and normal bowel sounds Musculoskeletal:no cyanosis of digits and no clubbing  PSYCH: alert & oriented x 3 with fluent speech NEURO: no focal motor/sensory deficits  LABORATORY DATA:  I have reviewed the data as listed Recent Results (from the past 2160 hour(s))  Tissue transglutaminase, IgA     Status: None   Collection Time: 11/16/22  1:38 PM  Result Value Ref Range   Transglutaminase IgA <2 0 - 3 U/mL    Comment:                               Negative        0 -  3                               Weak Positive   4 - 10                               Positive           >10  Tissue Transglutaminase (tTG) has been identified  as the endomysial antigen.  Studies have demonstr-  ated that endomysial IgA antibodies have over 99%  specificity for gluten sensitive enteropathy.   IgA     Status: None   Collection Time: 11/16/22  1:38 PM  Result Value Ref Range   IgA/Immunoglobulin A, Serum 82 61 - 437 mg/dL  CBC with Differential/Platelet     Status: Abnormal   Collection Time: 11/16/22  1:38 PM  Result Value Ref Range   WBC 5.2 3.4 - 10.8 x10E3/uL   RBC 4.09 (L) 4.14 - 5.80 x10E6/uL   Hemoglobin 13.6 13.0 - 17.7  g/dL   Hematocrit 62.1 30.8 - 51.0 %   MCV 96 79 - 97 fL   MCH 33.3 (H) 26.6 - 33.0 pg   MCHC 34.5 31.5 - 35.7 g/dL   RDW 65.7 84.6 - 96.2 %   Platelets 145 (L) 150 - 450 x10E3/uL   Neutrophils 65 Not Estab. %   Lymphs 20 Not Estab. %   Monocytes 8 Not Estab. %   Eos 6 Not Estab. %   Basos 1 Not Estab. %   Neutrophils Absolute 3.4 1.4 - 7.0 x10E3/uL   Lymphocytes Absolute 1.0 0.7 - 3.1 x10E3/uL   Monocytes Absolute 0.4 0.1 - 0.9 x10E3/uL   EOS (ABSOLUTE) 0.3 0.0 - 0.4 x10E3/uL   Basophils Absolute 0.0 0.0 - 0.2 x10E3/uL   Immature Granulocytes 0 Not Estab. %   Immature Grans (Abs) 0.0 0.0 - 0.1 x10E3/uL  TSH + free  T4     Status: None   Collection Time: 11/16/22  1:38 PM  Result Value Ref Range   TSH 3.650 0.450 - 4.500 uIU/mL   Free T4 1.32 0.82 - 1.77 ng/dL  Iron, TIBC and Ferritin Panel     Status: None   Collection Time: 11/16/22  1:38 PM  Result Value Ref Range   Total Iron Binding Capacity 308 250 - 450 ug/dL   UIBC 921 194 - 174 ug/dL   Iron 081 38 - 448 ug/dL   Iron Saturation 42 15 - 55 %   Ferritin 78 30 - 400 ng/mL  GI Profile, Stool, PCR     Status: None   Collection Time: 11/23/22  1:35 PM  Result Value Ref Range   Campylobacter Not Detected Not Detected   C difficile toxin A/B Not Detected Not Detected   Plesiomonas shigelloides Not Detected Not Detected   Salmonella Not Detected Not Detected   Vibrio Not Detected Not Detected   Vibrio cholerae Not Detected Not Detected   Yersinia enterocolitica Not Detected Not Detected   Enteroaggregative E coli Not Detected Not Detected   Enteropathogenic E coli Not Detected Not Detected   Enterotoxigenic E coli Not Detected Not Detected   Shiga-toxin-producing E coli Not Detected Not Detected   E coli O157 Not applicable Not Detected   Shigella/Enteroinvasive E coli Not Detected Not Detected   Cryptosporidium Not Detected Not Detected   Cyclospora cayetanensis Not Detected Not Detected   Entamoeba histolytica Not  Detected Not Detected   Giardia lamblia Not Detected Not Detected   Adenovirus F 40/41 Not Detected Not Detected   Astrovirus Not Detected Not Detected   Norovirus GI/GII Not Detected Not Detected   Rotavirus A Not Detected Not Detected   Sapovirus Not Detected Not Detected  Basic metabolic panel     Status: Abnormal   Collection Time: 12/16/22  9:48 AM  Result Value Ref Range   Glucose 172 (H) 70 - 99 mg/dL   BUN 44 (H) 8 - 27 mg/dL   Creatinine, Ser 1.85 (H) 0.76 - 1.27 mg/dL   eGFR 58 (L) >63 JS/HFW/2.63   BUN/Creatinine Ratio 33 (H) 10 - 24   Sodium 141 134 - 144 mmol/L   Potassium 4.8 3.5 - 5.2 mmol/L   Chloride 105 96 - 106 mmol/L   CO2 22 20 - 29 mmol/L   Calcium 9.2 8.6 - 10.2 mg/dL  Bayer DCA Hb Z8H Waived     Status: Abnormal   Collection Time: 01/04/23  8:32 AM  Result Value Ref Range   HB A1C (BAYER DCA - WAIVED) 7.4 (H) 4.8 - 5.6 %    Comment:          Prediabetes: 5.7 - 6.4          Diabetes: >6.4          Glycemic control for adults with diabetes: <7.0   Microalbumin / creatinine urine ratio     Status: Abnormal   Collection Time: 01/04/23  8:35 AM  Result Value Ref Range   Creatinine, Urine 75.0 Not Estab. mg/dL   Microalbumin, Urine 885.0 Not Estab. ug/mL   Microalb/Creat Ratio 433 (H) 0 - 29 mg/g creat    Comment:                        Normal:                0 -  29  Moderately increased: 30 - 300                        Severely increased:       >300   CBC with Differential/Platelet     Status: Abnormal   Collection Time: 01/04/23  8:38 AM  Result Value Ref Range   WBC 4.5 3.4 - 10.8 x10E3/uL   RBC 4.47 4.14 - 5.80 x10E6/uL   Hemoglobin 14.3 13.0 - 17.7 g/dL   Hematocrit 60.6 30.1 - 51.0 %   MCV 94 79 - 97 fL   MCH 32.0 26.6 - 33.0 pg   MCHC 34.0 31.5 - 35.7 g/dL   RDW 60.1 09.3 - 23.5 %   Platelets 126 (L) 150 - 450 x10E3/uL   Neutrophils 58 Not Estab. %   Lymphs 22 Not Estab. %   Monocytes 12 Not Estab. %   Eos 7 Not  Estab. %   Basos 1 Not Estab. %   Neutrophils Absolute 2.6 1.4 - 7.0 x10E3/uL   Lymphocytes Absolute 1.0 0.7 - 3.1 x10E3/uL   Monocytes Absolute 0.6 0.1 - 0.9 x10E3/uL   EOS (ABSOLUTE) 0.3 0.0 - 0.4 x10E3/uL   Basophils Absolute 0.0 0.0 - 0.2 x10E3/uL   Immature Granulocytes 0 Not Estab. %   Immature Grans (Abs) 0.0 0.0 - 0.1 x10E3/uL  CMP14+EGFR     Status: Abnormal   Collection Time: 01/04/23  8:38 AM  Result Value Ref Range   Glucose 142 (H) 70 - 99 mg/dL   BUN 25 8 - 27 mg/dL   Creatinine, Ser 5.73 0.76 - 1.27 mg/dL   eGFR 66 >22 GU/RKY/7.06   BUN/Creatinine Ratio 21 10 - 24   Sodium 143 134 - 144 mmol/L   Potassium 4.8 3.5 - 5.2 mmol/L   Chloride 105 96 - 106 mmol/L   CO2 23 20 - 29 mmol/L   Calcium 9.2 8.6 - 10.2 mg/dL   Total Protein 6.9 6.0 - 8.5 g/dL   Albumin 4.3 3.9 - 4.9 g/dL   Globulin, Total 2.6 1.5 - 4.5 g/dL   Albumin/Globulin Ratio 1.7 1.2 - 2.2   Bilirubin Total 0.5 0.0 - 1.2 mg/dL   Alkaline Phosphatase 62 44 - 121 IU/L   AST 48 (H) 0 - 40 IU/L   ALT 60 (H) 0 - 44 IU/L  Lipid panel     Status: None   Collection Time: 01/04/23  8:38 AM  Result Value Ref Range   Cholesterol, Total 125 100 - 199 mg/dL   Triglycerides 237 0 - 149 mg/dL   HDL 40 >62 mg/dL   VLDL Cholesterol Cal 23 5 - 40 mg/dL   LDL Chol Calc (NIH) 62 0 - 99 mg/dL   Chol/HDL Ratio 3.1 0.0 - 5.0 ratio    Comment:                                   T. Chol/HDL Ratio                                             Men  Women                               1/2 Avg.Risk  3.4    3.3                                   Avg.Risk  5.0    4.4                                2X Avg.Risk  9.6    7.1                                3X Avg.Risk 23.4   11.0     RADIOGRAPHIC STUDIES: I have personally reviewed the radiological images as listed and agreed with the findings in the report. CT CARDIAC MORPH/PULM VEIN W/CM&W/O CA SCORE  Addendum Date: 01/10/2023   ADDENDUM REPORT: 01/10/2023 09:25 CLINICAL DATA:   Atrial fibrillation scheduled for left atrial appendage closure. EXAM: Cardiac CT/CTA TECHNIQUE: A 120 kV retrospective scan was triggered in the ascending thoracic aorta at 140 HU's. Gantry rotation speed was 250 msecs and collimation was .6 mm. No beta blockade and no NTG was given. The 3D data set was reconstructed for best systolic and diastolic phases along with delayed images of the LAA Images analyzed on a dedicated work station using MPR, MIP and VRT modes. The patient received 80 cc of contrast. FINDINGS: Image quality: Average. Noise artifact is: Moderately reduced SNR and CNR. Left Atrium: The left atrial size is normal. There is no PFO/ASD. There is normal pulmonary vein drainage into the left atrium (2 on the right and 2 on the left). Left Atrial Appendage: Morphology: The left atrial appendage is windsock morphology. Thrombus: There is no thrombus in the left atrial appendage on contrast or delayed imaging. The following measurements were made regarding left atrial appendage closure: Phase assessed: 30% Landing Zone measurement: 29.6 X 20.0 mm, average 24 mm LAA Length (maximum): 17.3 mm Optimal interatrial septum puncture site: Posterior and mid Optimal deployment angle: RAO 42 CRA 22 Catheter: A double curve catheter is recommended. Watchman FLX Device: A 31 mm device is recommended with 22% compression. Other comments: None. Coronary Arteries: Normal coronary origin. Right dominance. The study was performed without use of NTG and insufficient for plaque evaluation. Coronary artery calcium score is 4417, which is 98th percentile for age and sex matched peers. 3 vessel coronary artery calcifications including LM. Right Atrium: Right atrial size is mildly dilated. Right Ventricle: The right ventricular cavity is within normal limits. Left Ventricle: The ventricular cavity size is within normal limits. There are no stigmata of prior infarction. There is no abnormal filling defect. Pulmonary Artery:  Moderately dilated main pulmonary artery, 34 mm. Cardiac valves: The aortic valve is grossly normal with mild calcification, AV calcium score 333. The mitral valve is normal structure without significant calcification. Aorta: Borderline size, 39 mm at mid ascending aorta. Pericardium: Normal thickness with no significant effusion or calcium present. Extra-cardiac findings: See attached radiology report for non-cardiac structures. IMPRESSION: 1. The left atrial appendage is windsock morphology. 2. A 31 mm Watchman FLX device is recommended based on the above landing zone measurements (29.6 maximum diameter, 24 mm average diameter; 22% compression). 3. There is no thrombus in the left atrial appendage. 4. An mid posterior IAS puncture site is recommended. 5. Optimal deployment angle: RAO 42 CRA 22 6. Normal coronary origin. Right dominance. Coronary artery  calcium score is 4417, which is 98th percentile for age and sex matched peers. 3 vessel coronary artery calcifications including LM. Electronically Signed   By: Weston Brass M.D.   On: 01/10/2023 09:25   Result Date: 01/10/2023 EXAM: OVER-READ INTERPRETATION  CT CHEST The following report is a limited chest CT over-read performed by radiologist Dr. Richarda Overlie of [facility] on 01/05/2023. This over-read does not include interpretation of cardiac or coronary anatomy or pathology. The cardiac CTA interpretation by the cardiologist is attached. COMPARISON:  CTA chest 02/10/2022 FINDINGS: Vascular: Normal caliber of the visualized thoracic aorta. Main pulmonary artery is prominent for size measuring up to 3.8 cm. Mediastinum/Nodes: Soft tissue in the right lower carina/right infrahilar region measures 1.1 cm in the short axis on sequence 10, image 29 and minimally changed. Overall, no significant lymph node enlargement in the visualized mediastinum. Lungs/Pleura: 2 mm subpleural nodule along the anterior right lung on image 8/9 is unchanged and likely  an incidental finding. Evidence for a tiny calcified granuloma in the anterior right lung on image 24/9. Small amount of volume loss in the left lower lobe. Upper Abdomen: Postsurgical changes involving the stomach. Musculoskeletal: Evidence for an old left rib fracture on image 75/9. IMPRESSION: 1. No acute extracardiac findings. 2. Main pulmonary artery is prominent for size measuring up to 3.8 cm. Findings are nonspecific but could be associated with pulmonary hypertension. Electronically Signed: By: Richarda Overlie M.D. On: 01/06/2023 10:15    ASSESSMENT:  1.  Mild thrombocytopenia and splenomegaly: - Mild to moderate thrombocytopenia since October 2013. - US abdomen (12/13/2022): Spleen size 13.4 cm, spleen volume 982 cm cube.  Fatty liver. - History of left leg DVT and unprovoked PE 10 years ago, was treated with 5 years of warfarin. - Subdural hematoma in February 2023, bur holes in May 2023. - Lost 100 pounds in the last 4 years after gastric sleeve surgery.  2.  Social/family history: - Lives at home with his wife.  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  Quit smoking in 1995.  Smoked 2 pack/day for 25 years. - No family history of cancer or leukemia.  Mother had low platelets. - Served in the [employer] and stationed in [place] in [place].  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  3.  Rheumatoid arthritis: - Predominantly affects hands, diagnosed in 2010. - Has been on Plaquenil, not optimally controlled per patient.   PLAN:  1.  Mild thrombocytopenia and splenomegaly: - Mild thrombocytopenia likely from splenomegaly. - Will repeat his platelet count and rule out nutritional deficiency disorders.  Will also check for connective tissue disorders and infectious causes.  Because of history of gastric sleeve surgery, we will also check ferritin and iron panel. - Will check for bone marrow infiltrative process. - She will see [place] back in 2 to 3 weeks for  follow-up.   All questions were answered. The patient knows to call the clinic with any problems, questions or concerns.      Doreatha Massed, MD 01/13/23 9:10 AM

## 2023-01-14 ENCOUNTER — Encounter (HOSPITAL_COMMUNITY): Payer: Self-pay | Admitting: Anesthesiology

## 2023-01-14 LAB — RHEUMATOID FACTOR: Rheumatoid fact SerPl-aCnc: <10 IU/mL (ref <14.0)

## 2023-01-16 ENCOUNTER — Telehealth: Payer: Self-pay | Admitting: *Deleted

## 2023-01-16 ENCOUNTER — Ambulatory Visit (HOSPITAL_COMMUNITY)
Admission: RE | Admit: 2023-01-16 | Discharge: 2023-01-16 | Disposition: A | Discharge status: Home / Self Care | Payer: Medicare Other | Source: Ambulatory Visit | Attending: Internal Medicine | Admitting: Internal Medicine

## 2023-01-16 ENCOUNTER — Encounter (HOSPITAL_COMMUNITY)
Admission: RE | Disposition: A | Discharge status: Home / Self Care | Payer: Self-pay | Source: Ambulatory Visit | Attending: Internal Medicine

## 2023-01-16 ENCOUNTER — Encounter (HOSPITAL_COMMUNITY): Payer: Self-pay

## 2023-01-16 DIAGNOSIS — R197 Diarrhea, unspecified: Secondary | ICD-10-CM

## 2023-01-16 DIAGNOSIS — Z539 Procedure and treatment not carried out, unspecified reason: Secondary | ICD-10-CM | POA: Insufficient documentation

## 2023-01-16 LAB — KAPPA/LAMBDA LIGHT CHAINS
Kappa free light chain: 44 mg/L — ABNORMAL HIGH (ref 3.3–19.4)
Kappa, lambda light chain ratio: 1.92 — ABNORMAL HIGH (ref 0.26–1.65)
Lambda free light chains: 22.9 mg/L (ref 5.7–26.3)

## 2023-01-16 LAB — ANTINUCLEAR ANTIBODIES, IFA: ANA Ab, IFA: NEGATIVE

## 2023-01-16 SURGERY — COLONOSCOPY WITH PROPOFOL
Anesthesia: Monitor Anesthesia Care

## 2023-01-16 MED ORDER — LACTATED RINGERS IV SOLN: INTRAVENOUS | Status: DC

## 2023-01-16 NOTE — Telephone Encounter (Signed)
He would need to be off Mounjaro for one week before his colonoscopy. This allows very little time to have colonoscopy done before his upcoming Watchman procedure planned on 02/02/23. He may be on Eliquis for 5 days before his 02/02/23 procedure and he will be on Eliquis for 6 weeks, then plavix after the procedure.     Let me know if we have any dates that would work (allowing patient off mounjaro for 7 days). And he will likely have to be off it for his Watchman procedure (he should confirm with cardiology).

## 2023-01-16 NOTE — Telephone Encounter (Signed)
First appointment I can get him in on is 01/27/23. Please advise. Thank you

## 2023-01-16 NOTE — Progress Notes (Signed)
Patient took his [facility] Sunday Jan.21 and was supposed to stop 1 week before procedure.  Patient was unaware he was supposed to stop the med.  Dr. Wyatt Haste notified upon his arrival and the case was canceled. [place] spoke to patient and explained reason for cancellation.  Wife informed of cancellation and patient was d/c ambulatory to waiting area.  No meds were given to patient.

## 2023-01-16 NOTE — Telephone Encounter (Signed)
Sorry that would be 01/30/23.

## 2023-01-16 NOTE — Telephone Encounter (Signed)
Pt had his colonoscopy scheduled for this morning but had to cancel because he recently started [facility] and did not hold it for the 7 days. He says he has been on medication for about 2 weeks. Please advise.Thank you

## 2023-01-17 LAB — PROTEIN ELECTROPHORESIS, SERUM
A/G Ratio: 1.4 (ref 0.7–1.7)
Albumin ELP: 3.7 g/dL (ref 2.9–4.4)
Alpha-1-Globulin: 0.1 g/dL (ref 0.0–0.4)
Alpha-2-Globulin: 0.7 g/dL (ref 0.4–1.0)
Beta Globulin: 0.8 g/dL (ref 0.7–1.3)
Gamma Globulin: 1 g/dL (ref 0.4–1.8)
Globulin, Total: 2.6 g/dL (ref 2.2–3.9)
Total Protein ELP: 6.3 g/dL (ref 6.0–8.5)

## 2023-01-17 LAB — COPPER, SERUM: Copper: 87 ug/dL (ref 69–132)

## 2023-01-19 LAB — IMMUNOFIXATION ELECTROPHORESIS
IgA: 73 mg/dL (ref 61–437)
IgG (Immunoglobin G), Serum: 1025 mg/dL (ref 603–1613)
IgM (Immunoglobulin M), Srm: 76 mg/dL (ref 20–172)
Total Protein ELP: 6.5 g/dL (ref 6.0–8.5)

## 2023-01-19 LAB — METHYLMALONIC ACID, SERUM: Methylmalonic Acid, Quantitative: 178 nmol/L (ref 0–378)

## 2023-01-20 NOTE — H&P (View-Only) (Signed)
[facility]                                     Cardiology Office Note:    Date:  01/25/2023   ID:  Mitchell Rod Sr., DOB 04-10-1951, MRN [id number]  PCP:  Mitchell Claude, MD  [facility] Cardiologist:  Mitchell Dell, MD   Referring MD: Mitchell Claude, MD   Chief Complaint  Patient presents with   Follow-up    Pre LAAO    History of Present Illness:    Mitchell Parker. is a 72 y.o. male with a hx of SDH x2 secondary to traumatic falls that required burr hole evacuation 04/2022, DVT, CKD stage IIIb, COPD, HTN, CHF, NICM, obesity, sleep apnea, DM2, and recently diagnosed with paroxysmal atrial flutter who was referred to Mitchell Parker for consideration of Watchman implant to avoid long term anticoagulation.   He is followed by Mitchell Parker for his cardiology care. In consultation with Mitchell Parker 12/16/22 he was felt to be a good candidate and underwent CT imaging that showed anatomy suitable to proceed with LAAO closure.   Anticoagulation plan will be to start Eliquis 5mg  BID x 6 weeks then use Plavix alone through 6 months. Plan to start Eliquis 5mg  BID five days prior to LAAO (2/3) closure scheduled for 02/02/23.   Today he is here with his wife and reports that he has been doing well. We discussed the procedure at length and answered all of his questions. He denies chest pain, palpitations, LE edema, SOB, orthopnea, bleeding in stool or urine, dizziness, or syncope.   Past Medical History:  Diagnosis Date   Chronic diarrhea    CKD (chronic kidney disease) stage 3, GFR 30-59 ml/min (HCC)    COPD (chronic obstructive pulmonary disease) (HCC)    Dysrhythmia 02/2022   paroxsymal SVT & aflutter 02/2022 XioXT   Essential hypertension    Gallstones    Gout    History of CHF (congestive heart failure)    History of colonic polyps    History of DVT (deep vein thrombosis) 04/19/2012   BLE DVT 04/09/12   Kidney stones    Lumbar disc disease    Neuropathy    Nonischemic  cardiomyopathy (HCC) 2003   Minor coronary atherosclerosis at cardiac catheterization 2003, LVEF initially 20% with normalization   Obesity    Orthostatic hypotension    RA (rheumatoid arthritis) (HCC)    Sleep apnea    Subarachnoid hemorrhage (HCC) 02/03/2022   Traumatic   Subdural hematoma (HCC) 02/03/2022   Traumatic   Syncope, vasovagal    Type 2 diabetes mellitus (HCC)     Past Surgical History:  Procedure Laterality Date   BARIATRIC SURGERY  01/11/2018   BURR HOLE Right 05/04/2022   Procedure: BURR HOLES FOR Subdural Hematoma;  Surgeon: 01/13/2018, MD;  Location: [facility] OR;  Service: Neurosurgery;  Laterality: Right;   CHOLECYSTECTOMY  01/18/2012   Procedure: LAPAROSCOPIC CHOLECYSTECTOMY WITH INTRAOPERATIVE CHOLANGIOGRAM;  Surgeon: [facility]. Cornett, MD;  Location: [facility] ORS;  Service: General;  Laterality: N/A;   EYE SURGERY Right    Film over retina was removed.   PILONIDAL CYST EXCISION  1997   TOE AMPUTATION Right    3 toes on right foot- big toe and next 2   UMBILICAL HERNIA REPAIR     VARICOSE VEIN SURGERY     left leg  Current Medications: Current Meds  Medication Sig   acetaminophen (TYLENOL) 500 MG tablet Take 500 mg by mouth every 6 (six) hours as needed for mild pain.   allopurinol (ZYLOPRIM) 300 MG tablet TAKE 1 TABLET BY MOUTH IN THE  MORNING   amLODipine (NORVASC) 10 MG tablet Take 10 mg by mouth in the morning.   amLODipine-olmesartan (AZOR) 10-40 MG tablet TAKE 1 TABLET BY MOUTH DAILY   apixaban (ELIQUIS) 5 MG TABS tablet Take 1 tablet (5 mg total) by mouth 2 (two) times daily.   atorvastatin (LIPITOR) 80 MG tablet TAKE 1 TABLET BY MOUTH DAILY AT  6 PM   calcitRIOL (ROCALTROL) 0.25 MCG capsule TAKE 1 CAPSULE BY MOUTH DAILY  WITH BREAKFAST   calcium carbonate (OSCAL) 1500 (600 Ca) MG TABS tablet Take 600 mg of elemental calcium by mouth 2 (two) times daily with a meal.   carvedilol (COREG) 3.125 MG tablet Take 3.125 mg by mouth in the morning.    carvedilol (COREG) 6.25 MG tablet 1/2 q am & 1 qpm (Patient taking differently: Take 6.25 mg by mouth every evening.)   celecoxib (CELEBREX) 200 MG capsule TAKE 1 CAPSULE BY MOUTH DAILY  WITH FOOD   cloNIDine (CATAPRES) 0.2 MG tablet TAKE 1 TABLET BY MOUTH TWICE  DAILY FOR BLOOD PRESSURE (Patient taking differently: Take 0.2 mg by mouth in the morning.)   Colchicine 0.6 MG CAPS Take 1 capsule by mouth daily.   DULoxetine (CYMBALTA) 30 MG capsule TAKE 1 CAPSULE BY MOUTH TWICE  DAILY   ferrous sulfate 325 (65 FE) MG tablet Take 325 mg by mouth in the morning.   fluocinolone (SYNALAR) 0.01 % external solution Place 3-5 drops in each ear canal twice daily (Patient taking differently: 3-5 drops 2 (two) times daily as needed (irritation/infection.). Place 3-5 drops in each ear canal twice daily)   fluticasone (FLONASE) 50 MCG/ACT nasal spray USE 2 SPRAYS IN BOTH  NOSTRILS DAILY AS NEEDED  FOR ALLERGIES OR RHINITIS   folic acid (FOLVITE) 1 MG tablet Take 1 mg by mouth in the morning.   furosemide (LASIX) 40 MG tablet TAKE 1 TABLET BY MOUTH IN THE  MORNING   hydroxychloroquine (PLAQUENIL) 200 MG tablet TAKE 1 TABLET BY MOUTH IN THE  MORNING AND AT BEDTIME   Multiple Vitamin (MULTIVITAMIN WITH MINERALS) TABS tablet Take 1 tablet by mouth in the morning.   neomycin-polymyxin-hydrocortisone (CORTISPORIN) 3.5-10000-1 OTIC suspension PLACE 4 DROPS INTO AFFECTED EAR TWICE DAILY FOR 10 DAYS (Patient taking differently: Place 4 drops into both ears 2 (two) times daily as needed (irritation/infection.). PLACE 4 DROPS INTO AFFECTED EAR TWICE DAILY FOR 10 DAYS)   pioglitazone (ACTOS) 30 MG tablet TAKE 1 TABLET BY MOUTH DAILY   potassium chloride SA (KLOR-CON M) 20 MEQ tablet TAKE 1 TABLET BY MOUTH DAILY FOR POTASSIUM REPLACEMENT/  SUPPLEMENT   sildenafil (REVATIO) 20 MG tablet Take 2-5 pills at once, orally, with each sexual encounter   tamsulosin (FLOMAX) 0.4 MG CAPS capsule TAKE 1 CAPSULE BY MOUTH AT  BEDTIME    tirzepatide (MOUNJARO) 2.5 MG/0.5ML Pen Inject 2.5 mg into the skin once a week. (Patient taking differently: Inject 2.5 mg into the skin every Sunday.)   vitamin B-12 (CYANOCOBALAMIN) 500 MCG tablet Take 500 mcg by mouth in the morning.   [DISCONTINUED] Sod Picosulfate-Mag Ox-Cit Acd (CLENPIQ) 10-3.5-12 MG-GM -GM/175ML SOLN Take 1 kit by mouth as directed.     Allergies:   Ciprofloxacin, Definity [perflutren lipid microsphere], and Levofloxacin [levofloxacin]  Social History   Socioeconomic History   Marital status: Married    Spouse name: Joycelyn Schmid   Number of children: 2   Years of education: Not on file   Highest education level: Some college, no degree  Occupational History   Occupation: retired  Tobacco Use   Smoking status: Former    Packs/day: 2.00    Years: 25.00    Total pack years: 50.00    Types: Cigarettes    Quit date: 12/26/1994    Years since quitting: 28.1   Smokeless tobacco: Never  Vaping Use   Vaping Use: Never used  Substance and Sexual Activity   Alcohol use: No   Drug use: No   Sexual activity: Not Currently  Other Topics Concern   Not on file  Social History Narrative   Lives at home with wife, and has two adult children. Still ambulatory, no cane or walker.    Works 30 hours per week at [employer].   Has VA benefits - goes to [place] once or twice a year for vision, hearing, screenings, vaccines, etc.   Social Determinants of Health   Financial Resource Strain: Low Risk  (10/05/2021)   Overall Financial Resource Strain (CARDIA)    Difficulty of Paying Living Expenses: Not hard at all  Food Insecurity: No Food Insecurity (10/05/2021)   Hunger Vital Sign    Worried About Running Out of Food in the Last Year: Never true    Ran Out of Food in the Last Year: Never true  Transportation Needs: No Transportation Needs (10/05/2021)   PRAPARE - Hydrologist (Medical): No    Lack of Transportation (Non-Medical): No  Physical Activity:  Sufficiently Active (10/05/2021)   Exercise Vital Sign    Days of Exercise per Week: 5 days    Minutes of Exercise per Session: 60 min  Stress: No Stress Concern Present (10/05/2021)   [place]    Feeling of Stress : Not at all  Social Connections: Moderately Isolated (10/05/2021)   Social Connection and Isolation Panel [NHANES]    Frequency of Communication with Friends and Family: Once a week    Frequency of Social Gatherings with Friends and Family: Once a week    Attends Religious Services: Never    Marine scientist or Organizations: Yes    Attends Music therapist: More than 4 times per year    Marital Status: Married    Family History: The patient's family history includes Diabetes in his brother and father; Heart disease in his father; Hypertension in his father and mother; Stroke in his mother. There is no history of Colon cancer.  ROS:   Please see the history of present illness.    All other systems reviewed and are negative.  EKGs/Labs/Other Studies Reviewed:    The following studies were reviewed today:  Cardiac CT with pulm/morph 01/05/23: IMPRESSION: 1. The left atrial appendage is windsock morphology.   2. A 31 mm Watchman FLX device is recommended based on the above landing zone measurements (29.6 maximum diameter, 24 mm average diameter; 22% compression).   3. There is no thrombus in the left atrial appendage.   4. An mid posterior IAS puncture site is recommended.   5. Optimal deployment angle: RAO 42 CRA 22   6. Normal coronary origin. Right dominance. Coronary artery calcium score is 4417, which is 98th percentile for age and sex matched peers. 3 vessel coronary  artery calcifications including LM.   EKG:  EKG is ordered today.  The ekg ordered today demonstrates NSR  Recent Labs: 02/10/2022: B Natriuretic Peptide 59.4 11/16/2022: TSH 3.650 01/04/2023: ALT 60; BUN  25; Creatinine, Ser 1.19; Potassium 4.8; Sodium 143 01/13/2023: Hemoglobin 14.0; Platelets 131   Recent Lipid Panel    Component Value Date/Time   CHOL 125 01/04/2023 0838   TRIG 126 01/04/2023 0838   HDL 40 01/04/2023 0838   CHOLHDL 3.1 01/04/2023 0838   CHOLHDL 11.7 10/22/2011 1030   VLDL 39 10/22/2011 1030   LDLCALC 62 01/04/2023 0838   Risk Assessment/Calculations:     CHA2DS2-VASc Score = 5   This indicates a 7.2% annual risk of stroke. The patient's score is based upon: CHF History: 1 HTN History: 1 Diabetes History: 1 Stroke History: 0 Vascular Disease History: 1 Age Score: 1 Gender Score: 0   HAS-BLED score 2 Hypertension No  Abnormal renal and liver function (Dialysis, transplant, Cr >2.26 mg/dL /Cirrhosis or Bilirubin >2x Normal or AST/ALT/AP >3x Normal) No  Stroke No  Bleeding Yes  Labile INR (Unstable/high INR) No  Elderly (>65) Yes  Drugs or alcohol (? 8 drinks/week, anti-plt or NSAID) No   Physical Exam:    VS:  BP (!) 120/58   Pulse (!) 47   Ht 6\' 1"  (1.854 m)   Wt (!) 324 lb 6.4 oz (147.1 kg)   SpO2 99%   BMI 42.80 kg/m     Wt Readings from Last 3 Encounters:  01/25/23 (!) 324 lb 6.4 oz (147.1 kg)  01/13/23 (!) 322 lb 5 oz (146.2 kg)  01/12/23 300 lb (136.1 kg)    General: Well developed, well nourished, NAD Lungs:Clear to ausculation bilaterally. No wheezes, rales, or rhonchi. Breathing is unlabored. Cardiovascular: RRR with S1 S2. No murmurs Extremities: No edema. Neuro: Alert and oriented. No focal deficits. No facial asymmetry. MAE spontaneously. Psych: Responds to questions appropriately with normal affect.    ASSESSMENT/PLAN:    CBC, BMET, instructions, soap, NYHA, Eliquis Rx.   Paroxsymal atrial flutter: Unfortunately with the patients hx of recurrent SDH he has not been a candidate for long term AC. He was referred for Watchman evaluation and was felt to be a good candidate to proceed.  Plan to start Eliquis 5mg  BID five days  prior to LAAO (2/3) closure scheduled for 02/02/23 then after 6 weeks will transition to Plavix monotherapy. Instruction letter reviewed with the patient and family. CHG soap given. Obtain CBC, BMET. 30-day new start Eliquis sent with patient today. Plan follow up after implant.   Hx of SDHx2 with burr hole extraction: No new changes today.   HFrecEF/NICM: Noted to have an LVEF @ 20% adting back to 2003 however has remained stable for many years in the 55-65% range. Appears volume stable today with no changes needed in his diuretics. Doing well symptomatically with NYHA class I symptoms. No changes today.   CKD stage IIIa: Baseline appears to be in the 1.1-1.3 range. Obtain pre procedure BMET today  Elevated coronary calcium score with 3VD/LM disease: Noted on CT imaging to be 4417, 98th percentile for age and sex matched control. ?  Medication Adjustments/Labs and Tests Ordered: Current medicines are reviewed at length with the patient today.  Concerns regarding medicines are outlined above.  Orders Placed This Encounter  Procedures   Basic Metabolic Panel (BMET)   CBC   EKG 12-Lead   Meds ordered this encounter  Medications   apixaban (ELIQUIS) 5 MG  TABS tablet    Sig: Take 1 tablet (5 mg total) by mouth 2 (two) times daily.    Dispense:  60 tablet    Refill:  6    Patient Instructions  Medication Instructions:   START Eliquis one (1) tablet by mouth ( 5 mg ) twice daily.  START FEBRUARY 3.   *If you need a refill on your cardiac medications before your next appointment, please call your pharmacy*   Lab Work:  TODAY!!!!! BMET/CBC  If you have labs (blood work) drawn today and your tests are completely normal, you will receive your results only by: [place] (if you have MyChart) OR A paper copy in the mail If you have any lab test that is abnormal or we need to change your treatment, we will call you to review the results.   Testing/Procedures:  See  letter.   Follow-Up: At [facility], you and your health needs are our priority.  As part of our continuing mission to provide you with exceptional heart care, we have created designated Provider Care Teams.  These Care Teams include your primary Cardiologist (physician) and Advanced Practice Providers (APPs -  Physician Assistants and Nurse Practitioners) who all work together to provide you with the care you need, when you need it.  We recommend signing up for the patient portal called "MyChart".  Sign up information is provided on this After Visit Summary.  MyChart is used to connect with patients for Virtual Visits (Telemedicine).  Patients are able to view lab/test results, encounter notes, upcoming appointments, etc.  Non-urgent messages can be sent to your provider as well.   To learn more about what you can do with MyChart, go to [email or web address].    Your next appointment:   4 week(s)  Provider:   Kathyrn Drown, NP      Signed, Kathyrn Drown, NP  01/25/2023 9:25 AM    [place]

## 2023-01-20 NOTE — Progress Notes (Unsigned)
[facility]                                     Cardiology Office Note:    Date:  01/25/2023   ID:  Mitchell Rod Sr., DOB 04-10-1951, MRN [id number]  PCP:  [name], MD  [facility] Cardiologist:  [name], MD   Referring MD: [name], MD   Chief Complaint  Patient presents with   Follow-up    Pre LAAO    History of Present Illness:    Mitchell Parker. is a 72 y.o. male with a hx of SDH x2 secondary to traumatic falls that required burr hole evacuation 04/2022, DVT, CKD stage IIIb, COPD, HTN, CHF, NICM, obesity, sleep apnea, DM2, and recently diagnosed with paroxysmal atrial flutter who was referred to Dr. Excell Seltzer for consideration of Watchman implant to avoid long term anticoagulation.   He is followed by Dr. Diona Browner for his cardiology care. In consultation with Dr. Excell Seltzer 12/16/22 he was felt to be a good candidate and underwent CT imaging that showed anatomy suitable to proceed with LAAO closure.   Anticoagulation plan will be to start Eliquis 5mg  BID x 6 weeks then use Plavix alone through 6 months. Plan to start Eliquis 5mg  BID five days prior to LAAO (2/3) closure scheduled for 02/02/23.   Today he is here with his wife and reports that he has been doing well. We discussed the procedure at length and answered all of his questions. He denies chest pain, palpitations, LE edema, SOB, orthopnea, bleeding in stool or urine, dizziness, or syncope.   Past Medical History:  Diagnosis Date   Chronic diarrhea    CKD (chronic kidney disease) stage 3, GFR 30-59 ml/min (HCC)    COPD (chronic obstructive pulmonary disease) (HCC)    Dysrhythmia 02/2022   paroxsymal SVT & aflutter 02/2022 XioXT   Essential hypertension    Gallstones    Gout    History of CHF (congestive heart failure)    History of colonic polyps    History of DVT (deep vein thrombosis) 04/19/2012   BLE DVT 04/09/12   Kidney stones    Lumbar disc disease    Neuropathy    Nonischemic  cardiomyopathy (HCC) 2003   Minor coronary atherosclerosis at cardiac catheterization 2003, LVEF initially 20% with normalization   Obesity    Orthostatic hypotension    RA (rheumatoid arthritis) (HCC)    Sleep apnea    Subarachnoid hemorrhage (HCC) 02/03/2022   Traumatic   Subdural hematoma (HCC) 02/03/2022   Traumatic   Syncope, vasovagal    Type 2 diabetes mellitus (HCC)     Past Surgical History:  Procedure Laterality Date   BARIATRIC SURGERY  01/11/2018   BURR HOLE Right 05/04/2022   Procedure: BURR HOLES FOR Subdural Hematoma;  Surgeon: 01/13/2018, MD;  Location: [facility] OR;  Service: Neurosurgery;  Laterality: Right;   CHOLECYSTECTOMY  01/18/2012   Procedure: LAPAROSCOPIC CHOLECYSTECTOMY WITH INTRAOPERATIVE CHOLANGIOGRAM;  Surgeon: [facility]. Cornett, MD;  Location: [facility] ORS;  Service: General;  Laterality: N/A;   EYE SURGERY Right    Film over retina was removed.   PILONIDAL CYST EXCISION  1997   TOE AMPUTATION Right    3 toes on right foot- big toe and next 2   UMBILICAL HERNIA REPAIR     VARICOSE VEIN SURGERY     left leg  Current Medications: Current Meds  Medication Sig   acetaminophen (TYLENOL) 500 MG tablet Take 500 mg by mouth every 6 (six) hours as needed for mild pain.   allopurinol (ZYLOPRIM) 300 MG tablet TAKE 1 TABLET BY MOUTH IN THE  MORNING   amLODipine (NORVASC) 10 MG tablet Take 10 mg by mouth in the morning.   amLODipine-olmesartan (AZOR) 10-40 MG tablet TAKE 1 TABLET BY MOUTH DAILY   apixaban (ELIQUIS) 5 MG TABS tablet Take 1 tablet (5 mg total) by mouth 2 (two) times daily.   atorvastatin (LIPITOR) 80 MG tablet TAKE 1 TABLET BY MOUTH DAILY AT  6 PM   calcitRIOL (ROCALTROL) 0.25 MCG capsule TAKE 1 CAPSULE BY MOUTH DAILY  WITH BREAKFAST   calcium carbonate (OSCAL) 1500 (600 Ca) MG TABS tablet Take 600 mg of elemental calcium by mouth 2 (two) times daily with a meal.   carvedilol (COREG) 3.125 MG tablet Take 3.125 mg by mouth in the morning.    carvedilol (COREG) 6.25 MG tablet 1/2 q am & 1 qpm (Patient taking differently: Take 6.25 mg by mouth every evening.)   celecoxib (CELEBREX) 200 MG capsule TAKE 1 CAPSULE BY MOUTH DAILY  WITH FOOD   cloNIDine (CATAPRES) 0.2 MG tablet TAKE 1 TABLET BY MOUTH TWICE  DAILY FOR BLOOD PRESSURE (Patient taking differently: Take 0.2 mg by mouth in the morning.)   Colchicine 0.6 MG CAPS Take 1 capsule by mouth daily.   DULoxetine (CYMBALTA) 30 MG capsule TAKE 1 CAPSULE BY MOUTH TWICE  DAILY   ferrous sulfate 325 (65 FE) MG tablet Take 325 mg by mouth in the morning.   fluocinolone (SYNALAR) 0.01 % external solution Place 3-5 drops in each ear canal twice daily (Patient taking differently: 3-5 drops 2 (two) times daily as needed (irritation/infection.). Place 3-5 drops in each ear canal twice daily)   fluticasone (FLONASE) 50 MCG/ACT nasal spray USE 2 SPRAYS IN BOTH  NOSTRILS DAILY AS NEEDED  FOR ALLERGIES OR RHINITIS   folic acid (FOLVITE) 1 MG tablet Take 1 mg by mouth in the morning.   furosemide (LASIX) 40 MG tablet TAKE 1 TABLET BY MOUTH IN THE  MORNING   hydroxychloroquine (PLAQUENIL) 200 MG tablet TAKE 1 TABLET BY MOUTH IN THE  MORNING AND AT BEDTIME   Multiple Vitamin (MULTIVITAMIN WITH MINERALS) TABS tablet Take 1 tablet by mouth in the morning.   neomycin-polymyxin-hydrocortisone (CORTISPORIN) 3.5-10000-1 OTIC suspension PLACE 4 DROPS INTO AFFECTED EAR TWICE DAILY FOR 10 DAYS (Patient taking differently: Place 4 drops into both ears 2 (two) times daily as needed (irritation/infection.). PLACE 4 DROPS INTO AFFECTED EAR TWICE DAILY FOR 10 DAYS)   pioglitazone (ACTOS) 30 MG tablet TAKE 1 TABLET BY MOUTH DAILY   potassium chloride SA (KLOR-CON M) 20 MEQ tablet TAKE 1 TABLET BY MOUTH DAILY FOR POTASSIUM REPLACEMENT/  SUPPLEMENT   sildenafil (REVATIO) 20 MG tablet Take 2-5 pills at once, orally, with each sexual encounter   tamsulosin (FLOMAX) 0.4 MG CAPS capsule TAKE 1 CAPSULE BY MOUTH AT  BEDTIME    tirzepatide (MOUNJARO) 2.5 MG/0.5ML Pen Inject 2.5 mg into the skin once a week. (Patient taking differently: Inject 2.5 mg into the skin every Sunday.)   vitamin B-12 (CYANOCOBALAMIN) 500 MCG tablet Take 500 mcg by mouth in the morning.   [DISCONTINUED] Sod Picosulfate-Mag Ox-Cit Acd (CLENPIQ) 10-3.5-12 MG-GM -GM/175ML SOLN Take 1 kit by mouth as directed.     Allergies:   Ciprofloxacin, Definity [perflutren lipid microsphere], and Levofloxacin [levofloxacin]  Social History   Socioeconomic History   Marital status: Married    Spouse name: Joycelyn Schmid   Number of children: 2   Years of education: Not on file   Highest education level: Some college, no degree  Occupational History   Occupation: retired  Tobacco Use   Smoking status: Former    Packs/day: 2.00    Years: 25.00    Total pack years: 50.00    Types: Cigarettes    Quit date: 12/26/1994    Years since quitting: 28.1   Smokeless tobacco: Never  Vaping Use   Vaping Use: Never used  Substance and Sexual Activity   Alcohol use: No   Drug use: No   Sexual activity: Not Currently  Other Topics Concern   Not on file  Social History Narrative   Lives at home with wife, and has two adult children. Still ambulatory, no cane or walker.    Works 30 hours per week at [employer].   Has VA benefits - goes to [place] once or twice a year for vision, hearing, screenings, vaccines, etc.   Social Determinants of Health   Financial Resource Strain: Low Risk  (10/05/2021)   Overall Financial Resource Strain (CARDIA)    Difficulty of Paying Living Expenses: Not hard at all  Food Insecurity: No Food Insecurity (10/05/2021)   Hunger Vital Sign    Worried About Running Out of Food in the Last Year: Never true    Ran Out of Food in the Last Year: Never true  Transportation Needs: No Transportation Needs (10/05/2021)   PRAPARE - Hydrologist (Medical): No    Lack of Transportation (Non-Medical): No  Physical Activity:  Sufficiently Active (10/05/2021)   Exercise Vital Sign    Days of Exercise per Week: 5 days    Minutes of Exercise per Session: 60 min  Stress: No Stress Concern Present (10/05/2021)   [place]    Feeling of Stress : Not at all  Social Connections: Moderately Isolated (10/05/2021)   Social Connection and Isolation Panel [NHANES]    Frequency of Communication with Friends and Family: Once a week    Frequency of Social Gatherings with Friends and Family: Once a week    Attends Religious Services: Never    Marine scientist or Organizations: Yes    Attends Music therapist: More than 4 times per year    Marital Status: Married    Family History: The patient's family history includes Diabetes in his brother and father; Heart disease in his father; Hypertension in his father and mother; Stroke in his mother. There is no history of Colon cancer.  ROS:   Please see the history of present illness.    All other systems reviewed and are negative.  EKGs/Labs/Other Studies Reviewed:    The following studies were reviewed today:  Cardiac CT with pulm/morph 01/05/23: IMPRESSION: 1. The left atrial appendage is windsock morphology.   2. A 31 mm Watchman FLX device is recommended based on the above landing zone measurements (29.6 maximum diameter, 24 mm average diameter; 22% compression).   3. There is no thrombus in the left atrial appendage.   4. An mid posterior IAS puncture site is recommended.   5. Optimal deployment angle: RAO 42 CRA 22   6. Normal coronary origin. Right dominance. Coronary artery calcium score is 4417, which is 98th percentile for age and sex matched peers. 3 vessel coronary  artery calcifications including LM.   EKG:  EKG is ordered today.  The ekg ordered today demonstrates NSR  Recent Labs: 02/10/2022: B Natriuretic Peptide 59.4 11/16/2022: TSH 3.650 01/04/2023: ALT 60; BUN  25; Creatinine, Ser 1.19; Potassium 4.8; Sodium 143 01/13/2023: Hemoglobin 14.0; Platelets 131   Recent Lipid Panel    Component Value Date/Time   CHOL 125 01/04/2023 0838   TRIG 126 01/04/2023 0838   HDL 40 01/04/2023 0838   CHOLHDL 3.1 01/04/2023 0838   CHOLHDL 11.7 10/22/2011 1030   VLDL 39 10/22/2011 1030   LDLCALC 62 01/04/2023 0838   Risk Assessment/Calculations:     CHA2DS2-VASc Score = 5   This indicates a 7.2% annual risk of stroke. The patient's score is based upon: CHF History: 1 HTN History: 1 Diabetes History: 1 Stroke History: 0 Vascular Disease History: 1 Age Score: 1 Gender Score: 0   HAS-BLED score 2 Hypertension No  Abnormal renal and liver function (Dialysis, transplant, Cr >2.26 mg/dL /Cirrhosis or Bilirubin >2x Normal or AST/ALT/AP >3x Normal) No  Stroke No  Bleeding Yes  Labile INR (Unstable/high INR) No  Elderly (>65) Yes  Drugs or alcohol (? 8 drinks/week, anti-plt or NSAID) No   Physical Exam:    VS:  BP (!) 120/58   Pulse (!) 47   Ht 6\' 1"  (1.854 m)   Wt (!) 324 lb 6.4 oz (147.1 kg)   SpO2 99%   BMI 42.80 kg/m     Wt Readings from Last 3 Encounters:  01/25/23 (!) 324 lb 6.4 oz (147.1 kg)  01/13/23 (!) 322 lb 5 oz (146.2 kg)  01/12/23 300 lb (136.1 kg)    General: Well developed, well nourished, NAD Lungs:Clear to ausculation bilaterally. No wheezes, rales, or rhonchi. Breathing is unlabored. Cardiovascular: RRR with S1 S2. No murmurs Extremities: No edema. Neuro: Alert and oriented. No focal deficits. No facial asymmetry. MAE spontaneously. Psych: Responds to questions appropriately with normal affect.    ASSESSMENT/PLAN:    CBC, BMET, instructions, soap, NYHA, Eliquis Rx.   Paroxsymal atrial flutter: Unfortunately with the patients hx of recurrent SDH he has not been a candidate for long term AC. He was referred for Watchman evaluation and was felt to be a good candidate to proceed.  Plan to start Eliquis 5mg  BID five days  prior to LAAO (2/3) closure scheduled for 02/02/23 then after 6 weeks will transition to Plavix monotherapy. Instruction letter reviewed with the patient and family. CHG soap given. Obtain CBC, BMET. 30-day new start Eliquis sent with patient today. Plan follow up after implant.   Hx of SDHx2 with burr hole extraction: No new changes today.   HFrecEF/NICM: Noted to have an LVEF @ 20% adting back to 2003 however has remained stable for many years in the 55-65% range. Appears volume stable today with no changes needed in his diuretics. Doing well symptomatically with NYHA class I symptoms. No changes today.   CKD stage IIIa: Baseline appears to be in the 1.1-1.3 range. Obtain pre procedure BMET today  Elevated coronary calcium score with 3VD/LM disease: Noted on CT imaging to be 4417, 98th percentile for age and sex matched control. ?  Medication Adjustments/Labs and Tests Ordered: Current medicines are reviewed at length with the patient today.  Concerns regarding medicines are outlined above.  Orders Placed This Encounter  Procedures   Basic Metabolic Panel (BMET)   CBC   EKG 12-Lead   Meds ordered this encounter  Medications   apixaban (ELIQUIS) 5 MG  TABS tablet    Sig: Take 1 tablet (5 mg total) by mouth 2 (two) times daily.    Dispense:  60 tablet    Refill:  6    Patient Instructions  Medication Instructions:   START Eliquis one (1) tablet by mouth ( 5 mg ) twice daily.  START FEBRUARY 3.   *If you need a refill on your cardiac medications before your next appointment, please call your pharmacy*   Lab Work:  TODAY!!!!! BMET/CBC  If you have labs (blood work) drawn today and your tests are completely normal, you will receive your results only by: [place] (if you have MyChart) OR A paper copy in the mail If you have any lab test that is abnormal or we need to change your treatment, we will call you to review the results.   Testing/Procedures:  See  letter.   Follow-Up: At [facility], you and your health needs are our priority.  As part of our continuing mission to provide you with exceptional heart care, we have created designated Provider Care Teams.  These Care Teams include your primary Cardiologist (physician) and Advanced Practice Providers (APPs -  Physician Assistants and Nurse Practitioners) who all work together to provide you with the care you need, when you need it.  We recommend signing up for the patient portal called "MyChart".  Sign up information is provided on this After Visit Summary.  MyChart is used to connect with patients for Virtual Visits (Telemedicine).  Patients are able to view lab/test results, encounter notes, upcoming appointments, etc.  Non-urgent messages can be sent to your provider as well.   To learn more about what you can do with MyChart, go to [email or web address].    Your next appointment:   4 week(s)  Provider:   Kathyrn Drown, NP      Signed, Kathyrn Drown, NP  01/25/2023 9:25 AM    [place]

## 2023-01-25 ENCOUNTER — Ambulatory Visit: Payer: Medicare Other | Attending: Cardiology | Admitting: Cardiology

## 2023-01-25 ENCOUNTER — Telehealth: Payer: Self-pay | Admitting: Cardiology

## 2023-01-25 ENCOUNTER — Other Ambulatory Visit: Payer: Self-pay | Admitting: Family Medicine

## 2023-01-25 VITALS — BP 120/58 | HR 47 | Ht 73.0 in | Wt 324.4 lb

## 2023-01-25 DIAGNOSIS — I4892 Unspecified atrial flutter: Secondary | ICD-10-CM

## 2023-01-25 DIAGNOSIS — I428 Other cardiomyopathies: Secondary | ICD-10-CM | POA: Diagnosis not present

## 2023-01-25 DIAGNOSIS — Z01810 Encounter for preprocedural cardiovascular examination: Secondary | ICD-10-CM | POA: Diagnosis not present

## 2023-01-25 DIAGNOSIS — S065XAA Traumatic subdural hemorrhage with loss of consciousness status unknown, initial encounter: Secondary | ICD-10-CM | POA: Diagnosis not present

## 2023-01-25 MED ORDER — APIXABAN 5 MG PO TABS: 5.0000 mg | ORAL_TABLET | Freq: Two times a day (BID) | ORAL | 1 refills | Status: DC

## 2023-01-25 MED ORDER — APIXABAN 5 MG PO TABS: 5.0000 mg | ORAL_TABLET | Freq: Two times a day (BID) | ORAL | 6 refills | Status: DC

## 2023-01-25 NOTE — Telephone Encounter (Signed)
*  STAT* If patient is at the pharmacy, call can be transferred to refill team.   1. Which medications need to be refilled? (please list name of each medication and dose if known) apixaban (ELIQUIS) 5 MG TABS tablet   2. Which pharmacy/location (including street and city if local pharmacy) is medication to be sent to? [facility] ([place], [address] [place]    3. Do they need a 30 day or 90 day supply? 90 days  Per pt's wife, they would like for the refill to send to [facility] instead since they received enough samples for eliquis

## 2023-01-25 NOTE — Patient Instructions (Signed)
Medication Instructions:   START Eliquis one (1) tablet by mouth ( 5 mg ) twice daily.  START FEBRUARY 3.   *If you need a refill on your cardiac medications before your next appointment, please call your pharmacy*   Lab Work:  TODAY!!!!! BMET/CBC  If you have labs (blood work) drawn today and your tests are completely normal, you will receive your results only by: [place] (if you have MyChart) OR A paper copy in the mail If you have any lab test that is abnormal or we need to change your treatment, we will call you to review the results.   Testing/Procedures:  See letter.   Follow-Up: At [facility], you and your health needs are our priority.  As part of our continuing mission to provide you with exceptional heart care, we have created designated Provider Care Teams.  These Care Teams include your primary Cardiologist (physician) and Advanced Practice Providers (APPs -  Physician Assistants and Nurse Practitioners) who all work together to provide you with the care you need, when you need it.  We recommend signing up for the patient portal called "MyChart".  Sign up information is provided on this After Visit Summary.  MyChart is used to connect with patients for Virtual Visits (Telemedicine).  Patients are able to view lab/test results, encounter notes, upcoming appointments, etc.  Non-urgent messages can be sent to your provider as well.   To learn more about what you can do with MyChart, go to [email or web address].    Your next appointment:   4 week(s)  Provider:   Kathyrn Drown, NP

## 2023-01-25 NOTE — Telephone Encounter (Signed)
Eliquis 5mg  refill request received. Patient is 72 years old, weight-147.1kg, Crea-1.19 on 01/04/23, Diagnosis-Aflutter, and last seen by Kathyrn Drown on 01/25/23. Dose is appropriate based on dosing criteria. Will send in refill to requested pharmacy per message.    Original refill was sent to local pharmacy but per message family is wanting it sent to mail order. Resent to mail order per pt request.

## 2023-01-26 ENCOUNTER — Encounter: Payer: Self-pay | Admitting: Internal Medicine

## 2023-01-26 LAB — CBC
Hematocrit: 42.6 % (ref 37.5–51.0)
Hemoglobin: 14.4 g/dL (ref 13.0–17.7)
MCH: 33.1 pg — ABNORMAL HIGH (ref 26.6–33.0)
MCHC: 33.8 g/dL (ref 31.5–35.7)
MCV: 98 fL — ABNORMAL HIGH (ref 79–97)
Platelets: 131 10*3/uL — ABNORMAL LOW (ref 150–450)
RBC: 4.35 x10E6/uL (ref 4.14–5.80)
RDW: 13.2 % (ref 11.6–15.4)
WBC: 5.2 10*3/uL (ref 3.4–10.8)

## 2023-01-26 LAB — BASIC METABOLIC PANEL
BUN/Creatinine Ratio: 26 — ABNORMAL HIGH (ref 10–24)
BUN: 37 mg/dL — ABNORMAL HIGH (ref 8–27)
CO2: 22 mmol/L (ref 20–29)
Calcium: 9.2 mg/dL (ref 8.6–10.2)
Chloride: 102 mmol/L (ref 96–106)
Creatinine, Ser: 1.42 mg/dL — ABNORMAL HIGH (ref 0.76–1.27)
Glucose: 104 mg/dL — ABNORMAL HIGH (ref 70–99)
Potassium: 4.6 mmol/L (ref 3.5–5.2)
Sodium: 138 mmol/L (ref 134–144)
eGFR: 53 mL/min/{1.73_m2} — ABNORMAL LOW (ref >59)

## 2023-01-26 NOTE — Telephone Encounter (Signed)
Pt called today wanting to set up procedure. Please advise. Thank you

## 2023-01-26 NOTE — Telephone Encounter (Signed)
Mailbox is full - unable to leave message.

## 2023-01-26 NOTE — Telephone Encounter (Signed)
The window of opportunity has passed. He restarts Eliquis 2/3 in preparation for Watchman procedure on 2/8. He will have to be on Eliquis for six weeks.   Please ask him if he has been advised not to take [facility] for 7 days before his Watchman procedure. I would hate for him to be cancelled like he was for the colonoscopy. He should really clarify this with cardiology.  Tell patient to call us once he knows he is coming off Eliquis an then we will schedule him.   Let's make note to try scheduling for end of March, could save a date for him when schedule available.

## 2023-01-27 ENCOUNTER — Telehealth: Payer: Self-pay | Admitting: Family Medicine

## 2023-01-27 ENCOUNTER — Other Ambulatory Visit: Payer: Self-pay

## 2023-01-27 MED ORDER — TIRZEPATIDE 2.5 MG/0.5ML ~~LOC~~ SOAJ: 2.5000 mg | SUBCUTANEOUS | 0 refills | Status: DC

## 2023-01-27 NOTE — Telephone Encounter (Signed)
Pt was informed of providers message. Verbalized understanding and he was advised to hold mounjaro for 7 days prior his Watchman procedure. Will call pt back and schedule procedure at the end of March.

## 2023-02-01 ENCOUNTER — Telehealth: Payer: Self-pay

## 2023-02-01 NOTE — Telephone Encounter (Signed)
Confirmed procedure date of 02/02/2023. Confirmed arrival time of 0530 for procedure time at 0730. Reviewed pre-procedure instructions with patient. Confirmed he held Williams injection last weekend.  The patient understands to call if questions/concerns arise prior to procedure. He was grateful for call.

## 2023-02-02 ENCOUNTER — Encounter (HOSPITAL_COMMUNITY): Payer: Self-pay | Admitting: Cardiovascular Disease

## 2023-02-02 ENCOUNTER — Other Ambulatory Visit: Payer: Self-pay

## 2023-02-02 ENCOUNTER — Inpatient Hospital Stay (HOSPITAL_COMMUNITY): Payer: Medicare Other

## 2023-02-02 ENCOUNTER — Inpatient Hospital Stay (HOSPITAL_COMMUNITY): Payer: Medicare Other | Admitting: Anesthesiology

## 2023-02-02 ENCOUNTER — Inpatient Hospital Stay (HOSPITAL_COMMUNITY)
Admission: RE | Admit: 2023-02-02 | Discharge: 2023-02-02 | DRG: 274 | Disposition: A | Discharge status: Home / Self Care | Payer: Medicare Other | Source: Ambulatory Visit | Attending: Cardiovascular Disease | Admitting: Cardiovascular Disease

## 2023-02-02 ENCOUNTER — Encounter (HOSPITAL_COMMUNITY)
Admission: RE | Disposition: A | Discharge status: Home / Self Care | Payer: Self-pay | Source: Ambulatory Visit | Attending: Cardiovascular Disease

## 2023-02-02 DIAGNOSIS — Z8249 Family history of ischemic heart disease and other diseases of the circulatory system: Secondary | ICD-10-CM | POA: Diagnosis not present

## 2023-02-02 DIAGNOSIS — N1832 Chronic kidney disease, stage 3b: Secondary | ICD-10-CM | POA: Diagnosis present

## 2023-02-02 DIAGNOSIS — Z823 Family history of stroke: Secondary | ICD-10-CM

## 2023-02-02 DIAGNOSIS — I480 Paroxysmal atrial fibrillation: Secondary | ICD-10-CM

## 2023-02-02 DIAGNOSIS — E1122 Type 2 diabetes mellitus with diabetic chronic kidney disease: Secondary | ICD-10-CM | POA: Diagnosis not present

## 2023-02-02 DIAGNOSIS — Z8669 Personal history of other diseases of the nervous system and sense organs: Secondary | ICD-10-CM

## 2023-02-02 DIAGNOSIS — I4892 Unspecified atrial flutter: Principal | ICD-10-CM | POA: Diagnosis present

## 2023-02-02 DIAGNOSIS — Z888 Allergy status to other drugs, medicaments and biological substances status: Secondary | ICD-10-CM | POA: Diagnosis not present

## 2023-02-02 DIAGNOSIS — Z006 Encounter for examination for normal comparison and control in clinical research program: Secondary | ICD-10-CM | POA: Diagnosis not present

## 2023-02-02 DIAGNOSIS — Z6841 Body Mass Index (BMI) 40.0 and over, adult: Secondary | ICD-10-CM | POA: Diagnosis not present

## 2023-02-02 DIAGNOSIS — Z881 Allergy status to other antibiotic agents status: Secondary | ICD-10-CM

## 2023-02-02 DIAGNOSIS — I10 Essential (primary) hypertension: Secondary | ICD-10-CM | POA: Diagnosis present

## 2023-02-02 DIAGNOSIS — Z86718 Personal history of other venous thrombosis and embolism: Secondary | ICD-10-CM

## 2023-02-02 DIAGNOSIS — I5032 Chronic diastolic (congestive) heart failure: Secondary | ICD-10-CM | POA: Diagnosis not present

## 2023-02-02 DIAGNOSIS — J449 Chronic obstructive pulmonary disease, unspecified: Secondary | ICD-10-CM | POA: Diagnosis present

## 2023-02-02 DIAGNOSIS — I110 Hypertensive heart disease with heart failure: Secondary | ICD-10-CM | POA: Diagnosis not present

## 2023-02-02 DIAGNOSIS — Z7985 Long-term (current) use of injectable non-insulin antidiabetic drugs: Secondary | ICD-10-CM

## 2023-02-02 DIAGNOSIS — I2510 Atherosclerotic heart disease of native coronary artery without angina pectoris: Secondary | ICD-10-CM | POA: Diagnosis present

## 2023-02-02 DIAGNOSIS — Z7901 Long term (current) use of anticoagulants: Secondary | ICD-10-CM | POA: Diagnosis not present

## 2023-02-02 DIAGNOSIS — E1140 Type 2 diabetes mellitus with diabetic neuropathy, unspecified: Secondary | ICD-10-CM | POA: Diagnosis present

## 2023-02-02 DIAGNOSIS — Z01818 Encounter for other preprocedural examination: Secondary | ICD-10-CM | POA: Diagnosis not present

## 2023-02-02 DIAGNOSIS — I130 Hypertensive heart and chronic kidney disease with heart failure and stage 1 through stage 4 chronic kidney disease, or unspecified chronic kidney disease: Secondary | ICD-10-CM | POA: Diagnosis present

## 2023-02-02 DIAGNOSIS — Z95818 Presence of other cardiac implants and grafts: Secondary | ICD-10-CM

## 2023-02-02 DIAGNOSIS — I4891 Unspecified atrial fibrillation: Secondary | ICD-10-CM | POA: Diagnosis present

## 2023-02-02 DIAGNOSIS — Z79899 Other long term (current) drug therapy: Secondary | ICD-10-CM | POA: Diagnosis not present

## 2023-02-02 DIAGNOSIS — Z833 Family history of diabetes mellitus: Secondary | ICD-10-CM

## 2023-02-02 DIAGNOSIS — I4819 Other persistent atrial fibrillation: Secondary | ICD-10-CM | POA: Diagnosis not present

## 2023-02-02 DIAGNOSIS — I509 Heart failure, unspecified: Secondary | ICD-10-CM

## 2023-02-02 DIAGNOSIS — Z87891 Personal history of nicotine dependence: Secondary | ICD-10-CM

## 2023-02-02 DIAGNOSIS — N189 Chronic kidney disease, unspecified: Secondary | ICD-10-CM | POA: Diagnosis present

## 2023-02-02 DIAGNOSIS — M069 Rheumatoid arthritis, unspecified: Secondary | ICD-10-CM | POA: Diagnosis present

## 2023-02-02 DIAGNOSIS — Z9884 Bariatric surgery status: Secondary | ICD-10-CM

## 2023-02-02 DIAGNOSIS — G4733 Obstructive sleep apnea (adult) (pediatric): Secondary | ICD-10-CM | POA: Diagnosis present

## 2023-02-02 DIAGNOSIS — D649 Anemia, unspecified: Secondary | ICD-10-CM | POA: Diagnosis present

## 2023-02-02 DIAGNOSIS — S065XAA Traumatic subdural hemorrhage with loss of consciousness status unknown, initial encounter: Secondary | ICD-10-CM

## 2023-02-02 DIAGNOSIS — I428 Other cardiomyopathies: Secondary | ICD-10-CM | POA: Diagnosis not present

## 2023-02-02 HISTORY — PX: TEE WITHOUT CARDIOVERSION: SHX5443

## 2023-02-02 HISTORY — DX: Presence of other cardiac implants and grafts: Z95.818

## 2023-02-02 HISTORY — PX: LEFT ATRIAL APPENDAGE OCCLUSION: EP1229

## 2023-02-02 LAB — SURGICAL PCR SCREEN
MRSA, PCR: POSITIVE — AB
Staphylococcus aureus: POSITIVE — AB

## 2023-02-02 LAB — GLUCOSE, CAPILLARY
Glucose-Capillary: 130 mg/dL — ABNORMAL HIGH (ref 70–99)
Glucose-Capillary: 126 mg/dL — ABNORMAL HIGH (ref 70–99)

## 2023-02-02 LAB — ECHO TEE

## 2023-02-02 LAB — TYPE AND SCREEN
ABO/RH(D): A NEG
Antibody Screen: NEGATIVE

## 2023-02-02 LAB — POCT ACTIVATED CLOTTING TIME: Activated Clotting Time: 325 seconds

## 2023-02-02 LAB — ABO/RH: ABO/RH(D): A NEG

## 2023-02-02 SURGERY — LEFT ATRIAL APPENDAGE OCCLUSION
Anesthesia: General

## 2023-02-02 MED ORDER — PROTAMINE SULFATE 10 MG/ML IV SOLN
INTRAVENOUS | Status: DC | PRN
  Administered 2023-02-02: 30 mg via INTRAVENOUS

## 2023-02-02 MED ORDER — PROPOFOL 10 MG/ML IV BOLUS
INTRAVENOUS | Status: DC | PRN
  Administered 2023-02-02: 160 mg via INTRAVENOUS
  Administered 2023-02-02: 40 mg via INTRAVENOUS

## 2023-02-02 MED ORDER — SODIUM CHLORIDE 0.9 % IV SOLN: INTRAVENOUS | Status: DC

## 2023-02-02 MED ORDER — SODIUM CHLORIDE 0.9% FLUSH: 3.0000 mL | INTRAVENOUS | Status: DC | PRN

## 2023-02-02 MED ORDER — SODIUM CHLORIDE 0.9% FLUSH: 3.0000 mL | Freq: Two times a day (BID) | INTRAVENOUS | Status: DC

## 2023-02-02 MED ORDER — MUPIROCIN 2 % EX OINT: 1.0000 | TOPICAL_OINTMENT | Freq: Two times a day (BID) | CUTANEOUS | Status: DC

## 2023-02-02 MED ORDER — HEPARIN (PORCINE) IN NACL 1000-0.9 UT/500ML-% IV SOLN
INTRAVENOUS | Status: DC | PRN
  Administered 2023-02-02: 500 mL

## 2023-02-02 MED ORDER — LACTATED RINGERS IV SOLN: INTRAVENOUS | Status: DC | PRN

## 2023-02-02 MED ORDER — SUCCINYLCHOLINE CHLORIDE 200 MG/10ML IV SOSY
PREFILLED_SYRINGE | INTRAVENOUS | Status: DC | PRN
  Administered 2023-02-02: 140 mg via INTRAVENOUS

## 2023-02-02 MED ORDER — ACETAMINOPHEN 500 MG PO TABS
1000.0000 mg | ORAL_TABLET | Freq: Once | ORAL | Status: AC
  Administered 2023-02-02: 1000 mg via ORAL
  Filled 2023-02-02: qty 2

## 2023-02-02 MED ORDER — IOHEXOL 350 MG/ML SOLN
INTRAVENOUS | Status: DC | PRN
  Administered 2023-02-02: 15 mL

## 2023-02-02 MED ORDER — HEPARIN (PORCINE) IN NACL 2000-0.9 UNIT/L-% IV SOLN
INTRAVENOUS | Status: DC | PRN
  Administered 2023-02-02: 1000 mL

## 2023-02-02 MED ORDER — INSULIN ASPART 100 UNIT/ML IJ SOLN: 0.0000 [IU] | INTRAMUSCULAR | Status: DC | PRN

## 2023-02-02 MED ORDER — PHENYLEPHRINE HCL-NACL 20-0.9 MG/250ML-% IV SOLN
INTRAVENOUS | Status: DC | PRN
  Administered 2023-02-02: 25 ug/min via INTRAVENOUS

## 2023-02-02 MED ORDER — CHLORHEXIDINE GLUCONATE 0.12 % MT SOLN
OROMUCOSAL | Status: AC
  Administered 2023-02-02: 15 mL
  Filled 2023-02-02: qty 15

## 2023-02-02 MED ORDER — EPHEDRINE SULFATE-NACL 50-0.9 MG/10ML-% IV SOSY
PREFILLED_SYRINGE | INTRAVENOUS | Status: DC | PRN
  Administered 2023-02-02 (×2): 5 mg via INTRAVENOUS

## 2023-02-02 MED ORDER — HEPARIN (PORCINE) IN NACL 2000-0.9 UNIT/L-% IV SOLN
INTRAVENOUS | Status: AC
  Filled 2023-02-02: qty 1000

## 2023-02-02 MED ORDER — DEXAMETHASONE SODIUM PHOSPHATE 10 MG/ML IJ SOLN
INTRAMUSCULAR | Status: DC | PRN
  Administered 2023-02-02: 5 mg via INTRAVENOUS

## 2023-02-02 MED ORDER — ACETAMINOPHEN 325 MG PO TABS: 650.0000 mg | ORAL_TABLET | ORAL | Status: DC | PRN

## 2023-02-02 MED ORDER — ONDANSETRON HCL 4 MG/2ML IJ SOLN
INTRAMUSCULAR | Status: DC | PRN
  Administered 2023-02-02: 4 mg via INTRAVENOUS

## 2023-02-02 MED ORDER — ROCURONIUM BROMIDE 10 MG/ML (PF) SYRINGE
PREFILLED_SYRINGE | INTRAVENOUS | Status: DC | PRN
  Administered 2023-02-02: 20 mg via INTRAVENOUS
  Administered 2023-02-02: 50 mg via INTRAVENOUS

## 2023-02-02 MED ORDER — FENTANYL CITRATE (PF) 250 MCG/5ML IJ SOLN
INTRAMUSCULAR | Status: DC | PRN
  Administered 2023-02-02 (×2): 50 ug via INTRAVENOUS

## 2023-02-02 MED ORDER — SODIUM CHLORIDE 0.9 % IV SOLN: 250.0000 mL | INTRAVENOUS | Status: DC | PRN

## 2023-02-02 MED ORDER — LIDOCAINE 2% (20 MG/ML) 5 ML SYRINGE
INTRAMUSCULAR | Status: DC | PRN
  Administered 2023-02-02: 100 mg via INTRAVENOUS

## 2023-02-02 MED ORDER — HEPARIN (PORCINE) IN NACL 1000-0.9 UT/500ML-% IV SOLN
INTRAVENOUS | Status: AC
  Filled 2023-02-02: qty 500

## 2023-02-02 MED ORDER — SUGAMMADEX SODIUM 200 MG/2ML IV SOLN
INTRAVENOUS | Status: DC | PRN
  Administered 2023-02-02: 400 mg via INTRAVENOUS

## 2023-02-02 MED ORDER — HEPARIN SODIUM (PORCINE) 1000 UNIT/ML IJ SOLN
INTRAMUSCULAR | Status: DC | PRN
  Administered 2023-02-02: 16000 [IU] via INTRAVENOUS

## 2023-02-02 MED ORDER — ONDANSETRON HCL 4 MG/2ML IJ SOLN: 4.0000 mg | Freq: Four times a day (QID) | INTRAMUSCULAR | Status: DC | PRN

## 2023-02-02 MED ORDER — CHLORHEXIDINE GLUCONATE CLOTH 2 % EX PADS: 6.0000 | MEDICATED_PAD | Freq: Every day | CUTANEOUS | Status: DC

## 2023-02-02 MED ORDER — CEFAZOLIN IN SODIUM CHLORIDE 3-0.9 GM/100ML-% IV SOLN
3.0000 g | INTRAVENOUS | Status: AC
  Administered 2023-02-02: 3 g via INTRAVENOUS
  Filled 2023-02-02: qty 100

## 2023-02-02 MED ORDER — CHLORHEXIDINE GLUCONATE 4 % EX LIQD: Freq: Once | CUTANEOUS | Status: DC

## 2023-02-02 SURGICAL SUPPLY — 22 items
CATH INFINITI 5FR ANG PIGTAIL (CATHETERS) IMPLANT
CLOSURE PERCLOSE PROSTYLE (VASCULAR PRODUCTS) IMPLANT
DEVICE WATCHMAN FLX PROC (KITS) IMPLANT
DILATOR VESSEL 38 20CM 14FR (INTRODUCER) IMPLANT
GUIDEWIRE INQWIRE 1.5J.035X260 (WIRE) ×1 IMPLANT
INQWIRE 1.5J .035X260CM (WIRE) ×1
KIT HEART LEFT (KITS) ×1 IMPLANT
KIT SHEA VERSACROSS LAAC CONNE (KITS) IMPLANT
MAT PREVALON FULL STRYKER (MISCELLANEOUS) IMPLANT
PACK CARDIAC CATHETERIZATION (CUSTOM PROCEDURE TRAY) ×1 IMPLANT
PAD DEFIB RADIO PHYSIO CONN (PAD) ×1 IMPLANT
SHEATH PERFORMER 16FR 30 (SHEATH) IMPLANT
SHEATH PINNACLE 8F 10CM (SHEATH) IMPLANT
SHEATH PROBE COVER 6X72 (BAG) ×1 IMPLANT
SHIELD RADPAD SCOOP 12X17 (MISCELLANEOUS) ×1 IMPLANT
SYS WATCHMAN FXD DBL (SHEATH) ×1
SYSTEM WATCHMAN FXD DBL (SHEATH) IMPLANT
TRANSDUCER W/STOPCOCK (MISCELLANEOUS) ×1 IMPLANT
TUBING CIL FLEX 10 FLL-RA (TUBING) ×1 IMPLANT
WATCHMAN FLX 27 (Prosthesis & Implant Heart) IMPLANT
WATCHMAN FLX PROCEDURE DEVICE (KITS) ×1 IMPLANT
WATCHMAN PROCED TRUSEAL ACCESS (SHEATH) IMPLANT

## 2023-02-02 NOTE — Anesthesia Postprocedure Evaluation (Signed)
Anesthesia Post Note  Patient: Mitchell RIDDLE Sr.  Procedure(s) Performed: LEFT ATRIAL APPENDAGE OCCLUSION TRANSESOPHAGEAL ECHOCARDIOGRAM     Patient location during evaluation: Cath Lab Anesthesia Type: General Level of consciousness: awake and alert Pain management: pain level controlled Vital Signs Assessment: post-procedure vital signs reviewed and stable Respiratory status: spontaneous breathing, nonlabored ventilation, respiratory function stable and patient connected to nasal cannula oxygen Cardiovascular status: blood pressure returned to baseline and stable Postop Assessment: no apparent nausea or vomiting Anesthetic complications: no   There were no known notable events for this encounter.  Last Vitals:  Vitals:   02/02/23 1415 02/02/23 1500  BP:  (!) 127/47  Pulse: (!) 50 (!) 54  Resp: 10 18  Temp:    SpO2: 96% 95%    Last Pain:  Vitals:   02/02/23 0945  TempSrc: Temporal  PainSc: 0-No pain                 Santa Lighter

## 2023-02-02 NOTE — Progress Notes (Signed)
  [place] TEAM  Patient doing well s/p LAAO closure. He is hemodynamically stable. Groin sites stable. Plan transfer to nursing unit to complete bedrest then discharge later today if no issues.   Kathyrn Drown NP-C Structural Heart Team  Pager: [phone] Phone: [phone]

## 2023-02-02 NOTE — Interval H&P Note (Signed)
History and Physical Interval Note:  02/02/2023 7:33 AM  Mitchell Rua Sr.  has presented today for surgery, with the diagnosis of atrial flutter.  The various methods of treatment have been discussed with the patient and family. After consideration of risks, benefits and other options for treatment, the patient has consented to  Procedure(s): LEFT ATRIAL APPENDAGE OCCLUSION (N/A) TRANSESOPHAGEAL ECHOCARDIOGRAM (N/A) as a surgical intervention.  The patient's history has been reviewed, patient examined, no change in status, stable for surgery.  I have reviewed the patient's chart and labs.  Questions were answered to the patient's satisfaction.     Sherren Mocha

## 2023-02-02 NOTE — Discharge Summary (Signed)
[facility]    Patient ID: Mitchell Ramp.,  MRN: [id number], DOB/AGE: [birth year] 72 y.o.  Admit date: 02/02/2023 Discharge date: 02/02/2023  Primary Care Physician: [name], MD  Primary Cardiologist: Rozann Lesches, MD / Dr. Burt Knack, MD [facility]) Electrophysiologist: None  Primary Discharge Diagnosis:  Paroxysmal Atrial Flutter Poor candidacy for long term anticoagulation due to h/o intracranial hemorrhage  Secondary Discharge Diagnosis:  -SDH x2 secondary to traumatic falls that required burr hole evacuation 04/2022 -DVT -CKD stage IIIb -COPD -HTN -CHF -NICM -obesity -Sleep apnea -DM2  Procedures This Admission:  Transeptal Puncture Intra-procedural TEE which showed no LAA thrombus Left atrial appendage occlusive device placement on 02/02/23 by Dr. Burt Knack.   This study demonstrated:  CONCLUSIONS:  Successful implantation of a WATCHMAN FLX left atrial appendage occlusive device   2.   No early apparent complications.    Anticoagulation Recommendations: Apixaban 5 mg BID x 6 weeks, then clopidogrel 75 mg daily through 6 months Cardiac CTA 2 months for surveillance Same day DC if criteria met  Brief HPI: Mitchell Parker. is a 72 y.o. male with a history of SDH x2 secondary to traumatic falls that required burr hole evacuation 04/2022, DVT, CKD stage IIIb, COPD, HTN, CHF, NICM, obesity, sleep apnea, DM2, and recently diagnosed with paroxysmal atrial flutter who was referred to Dr. Burt Knack for consideration of Watchman implant to avoid long term anticoagulation.   He is followed by Dr. Domenic Polite for his cardiology care. In consultation with Dr. Burt Knack 12/16/22 he was felt to be a good candidate and underwent CT imaging that showed anatomy suitable to proceed with LAAO closure.    Anticoagulation plan will be to start Eliquis 30m BID x 6 weeks then use Plavix alone through 6 months. He was started on Eliquis 550mBID five days prior to [place]  closure scheduled for 02/02/23.    Hospital Course:  The patient was admitted and underwent left atrial appendage occlusive device placement with a 2715mevice.  Groin site was without complication on the day of discharge. The patient was examined and considered to be stable for discharge. Wound care and restrictions were reviewed with the patient. The patient has been scheduled for post procedure follow up with JilKathyrn DrownP in approximately 1 month. Medication plan will be to restart Eliquis 5mg37mD x 6 weeks then use Plavix alone through 6 months. A repeat CT at approximately 60 days will be performed to ensure proper seal of the device.   Physical Exam: Vitals:   02/02/23 1400 02/02/23 1405 02/02/23 1410 02/02/23 1415  BP:      Pulse: (!) 57 (!) 54 (!) 50 (!) 50  Resp: 13 16 (!) 9 10  Temp:      TempSrc:      SpO2: 96% 96% 96% 96%  Weight:      Height:       General: Well developed, well nourished, NAD Lungs:Clear to ausculation bilaterally. No wheezes, rales, or rhonchi. Breathing is unlabored. Cardiovascular: RRR with S1 S2. No murmurs Extremities: No edema. Groin site.  Neuro: Alert and oriented. No focal deficits. No facial asymmetry. MAE spontaneously. Psych: Responds to questions appropriately with normal affect.    Labs:   Lab Results  Component Value Date   WBC 5.2 01/25/2023   HGB 14.4 01/25/2023   HCT 42.6 01/25/2023   MCV 98 (H) 01/25/2023   PLT 131 (L) 01/25/2023   No results for input(s): "NA", "K", "  CL", "CO2", "BUN", "CREATININE", "CALCIUM", "PROT", "BILITOT", "ALKPHOS", "ALT", "AST", "GLUCOSE" in the last 168 hours.  Invalid input(s): "LABALBU"   Discharge Medications:  Allergies as of 02/02/2023       Reactions   Ciprofloxacin Hives   Definity [perflutren Lipid Microsphere] Nausea And Vomiting   During ECHO with Definity injection pt became nauseated and vomited    Levofloxacin [levofloxacin] Hives        Medication List     TAKE these  medications    acetaminophen 500 MG tablet Commonly known as: TYLENOL Take 500 mg by mouth every 6 (six) hours as needed for mild pain.   allopurinol 300 MG tablet Commonly known as: ZYLOPRIM TAKE 1 TABLET BY MOUTH IN THE  MORNING   amLODipine 10 MG tablet Commonly known as: NORVASC Take 10 mg by mouth in the morning.   amLODipine-olmesartan 10-40 MG tablet Commonly known as: AZOR TAKE 1 TABLET BY MOUTH DAILY   apixaban 5 MG Tabs tablet Commonly known as: ELIQUIS Take 1 tablet (5 mg total) by mouth 2 (two) times daily. Notes to patient: Restart for evening dose tonight, 02/02/23   atorvastatin 80 MG tablet Commonly known as: LIPITOR TAKE 1 TABLET BY MOUTH DAILY AT  6 PM   calcitRIOL 0.25 MCG capsule Commonly known as: ROCALTROL TAKE 1 CAPSULE BY MOUTH DAILY  WITH BREAKFAST   calcium carbonate 1500 (600 Ca) MG Tabs tablet Commonly known as: OSCAL Take 600 mg of elemental calcium by mouth 2 (two) times daily with a meal.   carvedilol 6.25 MG tablet Commonly known as: COREG 1/2 q am & 1 qpm What changed:  how much to take how to take this when to take this additional instructions Another medication with the same name was removed. Continue taking this medication, and follow the directions you see here.   celecoxib 200 MG capsule Commonly known as: CELEBREX TAKE 1 CAPSULE BY MOUTH DAILY  WITH FOOD   cloNIDine 0.2 MG tablet Commonly known as: CATAPRES TAKE 1 TABLET BY MOUTH TWICE  DAILY FOR BLOOD PRESSURE What changed: See the new instructions.   Colchicine 0.6 MG Caps Take 1 capsule by mouth daily.   DULoxetine 30 MG capsule Commonly known as: CYMBALTA TAKE 1 CAPSULE BY MOUTH TWICE  DAILY   ferrous sulfate 325 (65 FE) MG tablet Take 325 mg by mouth in the morning.   fluocinolone 0.01 % external solution Commonly known as: Synalar Place 3-5 drops in each ear canal twice daily What changed:  how much to take when to take this reasons to take this    fluticasone 50 MCG/ACT nasal spray Commonly known as: FLONASE USE 2 SPRAYS IN BOTH  NOSTRILS DAILY AS NEEDED  FOR ALLERGIES OR RHINITIS   folic acid 1 MG tablet Commonly known as: FOLVITE Take 1 mg by mouth in the morning.   furosemide 40 MG tablet Commonly known as: LASIX TAKE 1 TABLET BY MOUTH IN THE  MORNING   hydroxychloroquine 200 MG tablet Commonly known as: PLAQUENIL TAKE 1 TABLET BY MOUTH IN THE  MORNING AND AT BEDTIME   multivitamin with minerals Tabs tablet Take 1 tablet by mouth in the morning.   neomycin-polymyxin-hydrocortisone 3.5-10000-1 OTIC suspension Commonly known as: CORTISPORIN PLACE 4 DROPS INTO AFFECTED EAR TWICE DAILY FOR 10 DAYS What changed:  how much to take how to take this when to take this reasons to take this   pioglitazone 30 MG tablet Commonly known as: ACTOS TAKE 1 TABLET BY MOUTH DAILY  potassium chloride SA 20 MEQ tablet Commonly known as: KLOR-CON M TAKE 1 TABLET BY MOUTH DAILY FOR POTASSIUM REPLACEMENT/  SUPPLEMENT   sildenafil 20 MG tablet Commonly known as: REVATIO Take 2-5 pills at once, orally, with each sexual encounter   tamsulosin 0.4 MG Caps capsule Commonly known as: FLOMAX TAKE 1 CAPSULE BY MOUTH AT  BEDTIME   tirzepatide 2.5 MG/0.5ML Pen Commonly known as: MOUNJARO Inject 2.5 mg into the skin once a week.   vitamin B-12 500 MCG tablet Commonly known as: CYANOCOBALAMIN Take 500 mcg by mouth in the morning.        Disposition:  Home  Discharge Instructions     Call MD for:  difficulty breathing, headache or visual disturbances   Complete by: As directed    Call MD for:  extreme fatigue   Complete by: As directed    Call MD for:  hives   Complete by: As directed    Call MD for:  persistant dizziness or light-headedness   Complete by: As directed    Call MD for:  persistant nausea and vomiting   Complete by: As directed    Call MD for:  redness, tenderness, or signs of infection (pain, swelling,  redness, odor or green/yellow discharge around incision site)   Complete by: As directed    Call MD for:  severe uncontrolled pain   Complete by: As directed    Call MD for:  temperature >100.4   Complete by: As directed    Diet - low sodium heart healthy   Complete by: As directed    Discharge instructions   Complete by: As directed    [facility] Procedure, Care After  Procedure MD: Dr. Armandina Stammer Clinical Coordinator: Lenice Llamas, RN  This sheet gives you information about how to care for yourself after your procedure. Your health care provider may also give you more specific instructions. If you have problems or questions, contact your health care provider.  What can I expect after the procedure? After the procedure, it is common to have: Bruising around your puncture site. Tenderness around your puncture site. Tiredness (fatigue).  Medication instructions It is very important to continue to take your blood thinner as directed by your doctor after the Watchman procedure. Call your procedure doctor's office with question or concerns. If you are on Coumadin (warfarin), you will have your INR checked the week after your procedure, with a goal INR of 2.0 - 3.0. Please follow your medication instructions on your discharge summary. Only take the medications listed on your discharge paperwork.  Follow up You will be seen in 1 month after your procedure You will have a repeat CT scan approximately 8 weeks after your procedure mark to check your device You will follow up the MD/APP who performed your procedure 6 months after your procedure The Watchman Clinical Coordinator will check in with you from time to time, including 1 and 2 years after your procedure.    Follow these instructions at home: Puncture site care  Follow instructions from your health care provider about how to take care of your puncture site. Make sure you: If present, leave stitches (sutures), skin glue, or  adhesive strips in place.  If a large square bandage is present, this may be removed 24 hours after surgery.  Check your puncture site every day for signs of infection. Check for: Redness, swelling, or pain. Fluid or blood. If your puncture site starts to bleed, lie down on your back, apply  firm pressure to the area, and contact your health care provider. Warmth. Pus or a bad smell. Driving Do not drive yourself home if you received sedation Do not drive for at least 4 days after your procedure or however long your health care provider recommends. (Do not resume driving if you have previously been instructed not to drive for other health reasons.) Do not spend greater than 1 hour at a time in a car for the first 3 days. Stop and take a break with a 5 minute walk at least every hour.  Do not drive or use heavy machinery while taking prescription pain medicine.  Activity Avoid activities that take a lot of effort, including exercise, for at least 7 days after your procedure. For the first 3 days, avoid sitting for longer than one hour at a time.  Avoid alcoholic beverages, signing paperwork, or participating in legal proceedings for 24 hours after receiving sedation Do not lift anything that is heavier than 10 lb (4.5 kg) for one week.  No sexual activity for 1 week.  Return to your normal activities as told by your health care provider. Ask your health care provider what activities are safe for you. General instructions Take over-the-counter and prescription medicines only as told by your health care provider. Do not use any products that contain nicotine or tobacco, such as cigarettes and e-cigarettes. If you need help quitting, ask your health care provider. You may shower after 24 hours, but Do not take baths, swim, or use a hot tub for 1 week.  Do not drink alcohol for 24 hours after your procedure. Keep all follow-up visits as told by your health care provider. This is important. Dental  Work: You will require antibiotics prior to any dental work, including cleanings, for 6 months after your Watchman implantation to help protect you from infection. After 6 months, antibiotics are no longer required. Contact a health care provider if: You have redness, mild swelling, or pain around your puncture site. You have soreness in your throat or at your puncture site that does not improve after several days You have fluid or blood coming from your puncture site that stops after applying firm pressure to the area. Your puncture site feels warm to the touch. You have pus or a bad smell coming from your puncture site. You have a fever. You have chest pain or discomfort that spreads to your neck, jaw, or arm. You are sweating a lot. You feel nauseous. You have a fast or irregular heartbeat. You have shortness of breath. You are dizzy or light-headed and feel the need to lie down. You have pain or numbness in the arm or leg closest to your puncture site. Get help right away if: Your puncture site suddenly swells. Your puncture site is bleeding and the bleeding does not stop after applying firm pressure to the area. These symptoms may represent a serious problem that is an emergency. Do not wait to see if the symptoms will go away. Get medical help right away. Call your local emergency services (911 in the [place] Do not drive yourself to the hospital. Summary After the procedure, it is normal to have bruising and tenderness at the puncture site in your groin, neck, or forearm. Check your puncture site every day for signs of infection. Get help right away if your puncture site is bleeding and the bleeding does not stop after applying firm pressure to the area. This is a medical emergency.  This information is  not intended to replace advice given to you by your health care provider. Make sure you discuss any questions you have with your health care provider.   Increase activity slowly    Complete by: As directed        Follow-up Information     Tommie Raymond, NP Follow up on 03/08/2023.   Specialty: Cardiology Why: @ 330pm. Please arrive at 315pm. Contact information: [address] [place] [address] [phone]                 Duration of Discharge Encounter: Greater than 30 minutes including physician time.  Signed, Kathyrn Drown, NP  02/02/2023 2:34 PM

## 2023-02-02 NOTE — Anesthesia Procedure Notes (Signed)
Procedure Name: Intubation Date/Time: 02/02/2023 7:55 AM  Performed by: Gaylene Brooks, CRNAPre-anesthesia Checklist: Patient identified, Emergency Drugs available, Suction available and Patient being monitored Patient Re-evaluated:Patient Re-evaluated prior to induction Oxygen Delivery Method: Circle System Utilized Preoxygenation: Pre-oxygenation with 100% oxygen Induction Type: IV induction Ventilation: Mask ventilation without difficulty, Oral airway inserted - appropriate to patient size and Two handed mask ventilation required Laryngoscope Size: Glidescope and 4 Grade View: Grade I Tube type: Oral Tube size: 7.5 mm Number of attempts: 1 Airway Equipment and Method: Stylet and Oral airway Placement Confirmation: ETT inserted through vocal cords under direct vision, positive ETCO2 and breath sounds checked- equal and bilateral Secured at: 25 cm Tube secured with: Tape Dental Injury: Teeth and Oropharynx as per pre-operative assessment

## 2023-02-02 NOTE — Op Note (Signed)
  [facility]   MULTIDISCIPLINARY HEART VALVE TEAM  SURGEON: Sherren Mocha, MD   TEE:  Eleonore Chiquito MD  PREPROCEDURE DIAGNOSIS: 1. Paroxysmal Atrial fibrillation 2. Hx intracranial hemorrhage   POSTPROCEDURE DIAGNOSIS:   1. Persistent Atrial fibrillation 2. Hx intracranial hemorrhage   PROCEDURES:  1. Transseptal puncture with RF energy 2. Transesophageal echocardiogram 3. Left atrial appendage occlusive device placement.  INTRODUCTION:  Mitchell Parker. is a 72 y.o. male with a history of atrial fibrillation who presents for left atrial appendage occlusive device placement.  he has a hx of subdural hematoma. After discussion with neurosurgery and the patient's primary cardiologist, followed by a shared decision making conversation, the patient is referred for [place] with [place].   DESCRIPTION OF PROCEDURE: Informed written consent was obtained, and the patient was brought to the hybrid catheterization lab in a fasting state. The patient received general anesthesia as outlined in the anesthesia report.  TEE was performed which revealed trivial pericardial effusion and no LAA thrombus.  Measurements of the atrial appendage OS revealed maximal diameter of 22 mm and depth of 28 mm.  Using direct US guidance and a micropuncture technique, the right femoral vein is accessed via a front wall puncture. 2 Perclose devices are deployed at 10' and 2' positions, and a 16-French sheath is placed in the right common femoral vein.  [place] images are captured and digitally stored in the patient's chart. A Baylis Versacross Connect transseptal sheath is advanced through the RCFV into the SVC.  Heparin is administered and a therapeutic ACT is achieved. Using fluroscopic guidance and TEE, transseptal puncture was performed which the puncture made in the inferior and mid portion of the intraatrial septum.  Transseptal puncture was confirmed by LA pressure measurement and TEE under fluoroscopic  guidance.   The [facility] double curve 34F access system is positioned in the mid-LA (lot # [id number]). A 6 F pigtail was introduced through the access guide and advanced under fluoroscopy and TEE guidance into the left atrial appendage.  An angiogram was then performed by hand injection of nonionic contrast into the left atrial appendage.  The pigtail and access guide were then advanced to the tip of the appendage.  The pigtail was then removed and exchanged for a [employer] WATCHMAN FLX Device 27 mm (LOT # [id number]) device.  The device is deployed using standard technique. Good position of the device was noted.   PASS Criteria: Final position of the WATCHMAN device revealed that it was located at the LAA ostium.  Tug test was performed and adequate.  Measurements by TEE revealed compression of 26-29%.  Doppler revealed no leak.  Fluoroscopy with angiogram also revealed no leak.  The device was deployed into the LAA using a standard technique.  The access system was then removed from the body and the Perclose sutures are tightened. The sheaths were removed and hemostasis was assured. EBL<42ml.  There were no early apparent complications.   CONCLUSIONS:  Successful implantation of a WATCHMAN FLX left atrial appendage occlusive device   2.   No early apparent complications.   Anticoagulation Recommendations: Apixaban 5 mg BID x 6 weeks, then clopidogrel 75 mg daily through 6 months Cardiac CTA 2 months for surveillance Same day DC if criteria met  Sherren Mocha MD 02/02/2023 11:58 AM

## 2023-02-02 NOTE — Anesthesia Preprocedure Evaluation (Addendum)
Anesthesia Evaluation  Patient identified by MRN, date of birth, ID band Patient awake    Reviewed: Allergy & Precautions, NPO status , Patient's Chart, lab work & pertinent test results, reviewed documented beta blocker date and time   Airway Mallampati: III  TM Distance: >3 FB Neck ROM: Full    Dental  (+) Dental Advisory Given, Poor Dentition   Pulmonary sleep apnea and Continuous Positive Airway Pressure Ventilation , COPD, former smoker   Pulmonary exam normal breath sounds clear to auscultation       Cardiovascular hypertension, Pt. on medications and Pt. on home beta blockers (-) angina +CHF and + DVT  (-) Past MI and (-) Cardiac Stents + dysrhythmias Atrial Fibrillation and Supra Ventricular Tachycardia  Rhythm:Regular Rate:Abnormal     Neuro/Psych H/o SAH   Neuromuscular disease  negative psych ROS   GI/Hepatic negative GI ROS, Neg liver ROS,,,  Endo/Other  diabetes, Type 2, Oral Hypoglycemic Agents  Morbid obesity  Renal/GU Renal InsufficiencyRenal disease     Musculoskeletal  (+) Arthritis , Rheumatoid disorders,    Abdominal   Peds  Hematology  (+) Blood dyscrasia (Plt 131k; Eliquis)   Anesthesia Other Findings Day of surgery medications reviewed with the patient.  Reproductive/Obstetrics                             Anesthesia Physical Anesthesia Plan  ASA: 3  Anesthesia Plan: General   Post-op Pain Management: Tylenol PO (pre-op)*   Induction: Intravenous  PONV Risk Score and Plan: 2 and Dexamethasone and Ondansetron  Airway Management Planned: Oral ETT and Video Laryngoscope Planned  Additional Equipment: ClearSight  Intra-op Plan:   Post-operative Plan: Extubation in OR  Informed Consent: I have reviewed the patients History and Physical, chart, labs and discussed the procedure including the risks, benefits and alternatives for the proposed anesthesia with the  patient or authorized representative who has indicated his/her understanding and acceptance.     Dental advisory given  Plan Discussed with: CRNA  Anesthesia Plan Comments: (2nd large bore PIV)        Anesthesia Quick Evaluation

## 2023-02-02 NOTE — Plan of Care (Signed)

## 2023-02-02 NOTE — TOC Initial Note (Signed)
Transition of Care (TOC) - Initial/Assessment Note    Patient Details  Name: Mitchell Parker. MRN: [id number] Date of Birth: [birth year]  Transition of Care [facility]) CM/SW Contact:    Verdell Carmine, RN Phone Number: 02/02/2023, 3:08 PM  Clinical Narrative:                  Watchman procedure, no needs identified. Patient will DC to home       Patient Goals and CMS Choice            Expected Discharge Plan and Services         Expected Discharge Date: 02/02/23                                    Prior Living Arrangements/Services                       Activities of Daily Living Home Assistive Devices/Equipment: CBG Meter, Hearing aid, CPAP, Eyeglasses ADL Screening (condition at time of admission) Patient's cognitive ability adequate to safely complete daily activities?: Yes Is the patient deaf or have difficulty hearing?: Yes Does the patient have difficulty seeing, even when wearing glasses/contacts?: No Does the patient have difficulty concentrating, remembering, or making decisions?: No Patient able to express need for assistance with ADLs?: Yes Does the patient have difficulty dressing or bathing?: No Independently performs ADLs?: Yes (appropriate for developmental age) Does the patient have difficulty walking or climbing stairs?: No Weakness of Legs: None Weakness of Arms/Hands: None  Permission Sought/Granted                  Emotional Assessment              Admission diagnosis:  Atrial fibrillation ([place]) [I48.91] Patient Active Problem List   Diagnosis Date Noted   PAF (paroxysmal atrial fibrillation) ([place]) 02/02/2023   Atrial fibrillation ([place]) 02/02/2023   Presence of Watchman left atrial appendage closure device 02/02/2023   Diarrhea 11/16/2022   Anemia 11/16/2022   Subdural hematoma ([place]) 05/04/2022   SDH (subdural hematoma) ([place]) 02/03/2022   Right ear pain 07/13/2021   Venous insufficiency of right leg  08/19/2020   Thrombocytopenia ([place]) 09/08/2018   Chronic diastolic congestive heart failure ([place]) 08/17/2017   Hypokalemia 05/23/2012   Rheumatoid aortitis 05/23/2012   Pulmonary embolism ([place]) 04/19/2012   Chronic renal insufficiency 04/17/2012   Obesity, morbid ([place]) 01/16/2012   CHF (congestive heart failure) ([place]) 01/16/2012   COPD (chronic obstructive pulmonary disease) ([place]) 01/16/2012   OSA on CPAP 01/16/2012   Gout 01/04/2012   Hypertension 01/04/2012   PCP:  [name], MD Pharmacy:   [facility]) [place], [place] [facility] [address] [place] [address] Phone: [phone] Fax: [phone]  [place], [address] [place] [address] Phone: [phone] Fax: [phone]  [id number] [place], [place] [address] Phone: [phone] Fax: [phone]     Social Determinants of Health (SDOH) Social History: SDOH Screenings   Food Insecurity: No Food Insecurity (10/05/2021)  Housing: Low Risk  (10/05/2021)  Transportation Needs: No Transportation Needs (10/05/2021)  Utilities: Not At Risk (01/13/2023)  Alcohol Screen: Low Risk  (10/05/2021)  Depression (PHQ2-9): Low Risk  (  01/04/2023)  Financial Resource Strain: Low Risk  (10/05/2021)  Physical Activity: Sufficiently Active (10/05/2021)  Social Connections: Moderately Isolated (10/05/2021)  Stress: No Stress Concern Present (10/05/2021)  Tobacco Use: Medium Risk (02/02/2023)   SDOH Interventions:     Readmission Risk Interventions     No data to display

## 2023-02-02 NOTE — Transfer of Care (Signed)
Immediate Anesthesia Transfer of Care Note  Patient: [name]  Procedure(s) Performed: LEFT ATRIAL APPENDAGE OCCLUSION TRANSESOPHAGEAL ECHOCARDIOGRAM  Patient Location: Cath Lab  Anesthesia Type:General  Level of Consciousness: awake, drowsy, and patient cooperative  Airway & Oxygen Therapy: Patient Spontanous Breathing and Patient connected to nasal cannula oxygen  Post-op Assessment: Report given to RN, Post -op Vital signs reviewed and stable, and Patient moving all extremities X 4  Post vital signs: Reviewed and stable  Last Vitals:  Vitals Value Taken Time  BP 143/46 02/02/23 0911  Temp    Pulse 47 02/02/23 0913  Resp 14 02/02/23 0913  SpO2 98 % 02/02/23 0913  Vitals shown include unvalidated device data.  Last Pain:  Vitals:   02/02/23 0629  TempSrc:   PainSc: 0-No pain      Patients Stated Pain Goal: 2 (07/06/18 7588)  Complications: There were no known notable events for this encounter.

## 2023-02-03 ENCOUNTER — Telehealth: Payer: Self-pay

## 2023-02-03 ENCOUNTER — Other Ambulatory Visit: Payer: Medicare Other

## 2023-02-03 ENCOUNTER — Encounter (HOSPITAL_COMMUNITY): Payer: Self-pay | Admitting: Cardiovascular Disease

## 2023-02-03 NOTE — Patient Outreach (Signed)
  Care Coordination TOC Note Transition Care Management Follow-up Telephone Call Date of discharge and from where[facility] How have you been since you were released from the hospital? "I am doing great" Any questions or concerns? No  Items Reviewed: Did the pt receive and understand the discharge instructions provided? Yes  Medications obtained and verified? Yes  Other? No  Any new allergies since your discharge? No  Dietary orders reviewed? No Do you have support at home? Yes   Home Care and Equipment/Supplies: Were home health services ordered? no If so, what is the name of the agency? N/a  Has the agency set up a time to come to the patient's home? no Were any new equipment or medical supplies ordered?  No What is the name of the medical supply agency? N/a Were you able to get the supplies/equipment? no Do you have any questions related to the use of the equipment or supplies? No  Functional Questionnaire: (I = Independent and D = Dependent) ADLs: I  Bathing/Dressing- I  Meal Prep- I  Eating- I  Maintaining continence- I  Transferring/Ambulation- I  Managing Meds- I  Follow up appointments reviewed:  PCP Hospital f/u appt confirmed? Yes  Scheduled to see Dr. Livia Snellen on 02/20/22 @ 1:55. [facility] f/u appt confirmed? Yes  Scheduled to see Kathyrn Drown NP on 03/08/23 @ 3:30. Are transportation arrangements needed? No  If their condition worsens, is the pt aware to call PCP or go to the Emergency Dept.? Yes Was the patient provided with contact information for the PCP's office or ED? Yes Was to pt encouraged to call back with questions or concerns? Yes  SDOH assessments and interventions completed:   Yes SDOH Interventions Today    Flowsheet Row Most Recent Value  SDOH Interventions   Food Insecurity Interventions Intervention Not Indicated  Housing Interventions Intervention Not Indicated  Transportation Interventions Intervention Not Indicated        Care Coordination Interventions:  No Care Coordination interventions needed at this time.   Encounter Outcome:  Pt. Visit Completed

## 2023-02-03 NOTE — Telephone Encounter (Signed)
  [place] Team  Contacted the patient regarding discharge from [facility] on 02/02/2023  The patient understands to follow up with Kathyrn Drown on 03/08/2023 in preparation for imaging on 04/06/2023.  The patient understands discharge instructions? Yes  The patient understands medications and regimen? Yes   The patient reports groin site looks healthy with no S/S of infection or bleeding.  The patient understands to call with any questions or concerns prior to scheduled visit.

## 2023-02-06 ENCOUNTER — Encounter: Payer: Self-pay | Admitting: Gastroenterology

## 2023-02-08 ENCOUNTER — Ambulatory Visit: Payer: Medicare Other | Admitting: Family Medicine

## 2023-02-09 ENCOUNTER — Other Ambulatory Visit (HOSPITAL_COMMUNITY): Payer: Self-pay

## 2023-02-09 NOTE — Telephone Encounter (Signed)
Ran test claim and received "refill too soon" error message stating that med had been filled 02/03/2023. Called [facility] to check status of medication [phone]) and was told that a PA for [facility] is not required at this time. Insurance is questioning the strength. Per [facility] mail order pharmacy, the medication is usually dosed at 2.65m for 4 weeks and then increased to 564m they are requesting either an increase in dose or an explanation as to why patient isn't increasing at this time. Please advise.

## 2023-02-09 NOTE — Telephone Encounter (Signed)
Patient aware.

## 2023-02-09 NOTE — Telephone Encounter (Signed)
I signed a form yesterday for the dose to be increased. Faxing should be in the works.

## 2023-02-09 NOTE — Telephone Encounter (Signed)
Pt calling in about this refill. He says that the rx is on hold till PA is done.

## 2023-02-10 ENCOUNTER — Other Ambulatory Visit: Payer: Self-pay

## 2023-02-10 ENCOUNTER — Inpatient Hospital Stay: Payer: Medicare Other | Attending: Hematology

## 2023-02-10 DIAGNOSIS — D696 Thrombocytopenia, unspecified: Secondary | ICD-10-CM

## 2023-02-10 DIAGNOSIS — I4891 Unspecified atrial fibrillation: Secondary | ICD-10-CM | POA: Diagnosis not present

## 2023-02-10 DIAGNOSIS — M069 Rheumatoid arthritis, unspecified: Secondary | ICD-10-CM | POA: Diagnosis not present

## 2023-02-10 DIAGNOSIS — K760 Fatty (change of) liver, not elsewhere classified: Secondary | ICD-10-CM | POA: Diagnosis not present

## 2023-02-10 DIAGNOSIS — R161 Splenomegaly, not elsewhere classified: Secondary | ICD-10-CM | POA: Diagnosis not present

## 2023-02-10 DIAGNOSIS — Z86718 Personal history of other venous thrombosis and embolism: Secondary | ICD-10-CM | POA: Diagnosis not present

## 2023-02-10 DIAGNOSIS — N1832 Chronic kidney disease, stage 3b: Secondary | ICD-10-CM | POA: Insufficient documentation

## 2023-02-10 DIAGNOSIS — Z7901 Long term (current) use of anticoagulants: Secondary | ICD-10-CM | POA: Diagnosis not present

## 2023-02-10 DIAGNOSIS — E611 Iron deficiency: Secondary | ICD-10-CM | POA: Diagnosis not present

## 2023-02-10 DIAGNOSIS — Z86711 Personal history of pulmonary embolism: Secondary | ICD-10-CM | POA: Insufficient documentation

## 2023-02-10 LAB — CBC WITH DIFFERENTIAL/PLATELET
Abs Immature Granulocytes: 0.02 10*3/uL (ref 0.00–0.07)
Basophils Absolute: 0 10*3/uL (ref 0.0–0.1)
Basophils Relative: 1 %
Eosinophils Absolute: 0.3 10*3/uL (ref 0.0–0.5)
Eosinophils Relative: 5 %
HCT: 40.4 % (ref 39.0–52.0)
Hemoglobin: 13.5 g/dL (ref 13.0–17.0)
Immature Granulocytes: 0 %
Lymphocytes Relative: 11 %
Lymphs Abs: 0.8 10*3/uL (ref 0.7–4.0)
MCH: 32.7 pg (ref 26.0–34.0)
MCHC: 33.4 g/dL (ref 30.0–36.0)
MCV: 97.8 fL (ref 80.0–100.0)
Monocytes Absolute: 0.5 10*3/uL (ref 0.1–1.0)
Monocytes Relative: 7 %
Neutro Abs: 5.5 10*3/uL (ref 1.7–7.7)
Neutrophils Relative %: 76 %
Platelets: 128 10*3/uL — ABNORMAL LOW (ref 150–400)
RBC: 4.13 MIL/uL — ABNORMAL LOW (ref 4.22–5.81)
RDW: 13 % (ref 11.5–15.5)
WBC: 7.2 10*3/uL (ref 4.0–10.5)
nRBC: 0 % (ref 0.0–0.2)

## 2023-02-10 LAB — IMMATURE PLATELET FRACTION: Immature Platelet Fraction: 2.2 % (ref 1.2–8.6)

## 2023-02-15 ENCOUNTER — Telehealth: Payer: Self-pay | Admitting: *Deleted

## 2023-02-15 MED ORDER — TIRZEPATIDE 5 MG/0.5ML ~~LOC~~ SOAJ: 5.0000 mg | SUBCUTANEOUS | 0 refills | Status: DC

## 2023-02-15 NOTE — Telephone Encounter (Signed)
Fax from [facility] RE: clarify directions for Mounjaro pen 2.5 mg/0.5 ml. It is typically dosed initially at 2.5 mg weekly for 4 weeks then increase to 5 mg weekly. Pt has already taken 2.5 mg dose. PCP check on fax to change to [facility] 5 mg weekly and this was faxed back. DC'd the Mounjaro 2.5 mg/0.5 ml dose and added the 5 mg dose w/ no print.

## 2023-02-15 NOTE — Progress Notes (Unsigned)
VIRTUAL VISIT via [place] [facility] [place] connected with [place]  on 02/16/2023 at 8:10 AM by video and verified that I am speaking with the correct person using two identifiers.  Location: Patient: [place] Provider: Home Office   I discussed the limitations, risks, security and privacy concerns of performing an evaluation and management service by virtual/video visit and the availability of in person appointments. I also discussed with the patient that there may be a patient responsible charge related to this service. The patient expressed understanding and agreed to proceed.  REASON FOR VISIT:  Follow-up for thrombocytopenia and splenomegaly  PRIOR THERAPY: None  CURRENT THERAPY: Under workup  INTERVAL HISTORY:   Mitchell Parker 72 y.o. male returns for routine follow-up of thrombocytopenia and splenomegaly.  He was seen for initial consultation by Dr. Delton Coombes on 01/13/2023.  At today's visit, he reports feeling FAIR.  Since his visit with Dr. Delton Coombes on 01/13/2023, he had Watchman device placed on 02/02/2023 due to atrial fibrillation/flutter, considered poor candidate for long-term anticoagulation.  Per cardiology, he will complete apixaban 5 mg twice daily for 6 weeks, then Plavix daily for 6 months.  He continues to report easy bruising on his forearms, but denies any petechial rash.  He reports left-sided nosebleed occurring 2-3 times each month but lasting for several hours.  He denies any rectal bleeding or melena.  He has not noticed any new lumps or bumps.  He denies any unexplained fevers, chills, night sweats, or weight loss.  He reports mild fatigue.  He has 85% energy and 70% appetite. He endorses that he is maintaining a stable weight.   ASSESSMENT & PLAN:  1.  Mild thrombocytopenia and splenomegaly: - Mild to moderate thrombocytopenia since October 2013. - US abdomen (12/13/2022): Spleen size 13.4 cm, spleen volume  982 cm cube.  Fatty liver. - Hematology workup (01/13/2023): Borderline low ferritin (33), with normal iron saturation. Normal CRP and ESR.  RF and ANA negative. Labs consistent with prior hepatitis B infection.  No evidence of hepatitis C. Mildly elevated kappa free light chains with ratio 1.92, likely secondary to CKD stage IIIb.  SPEP and immunofixation were negative. Normal copper, MMA, B12, folate. Normal LDH.  Normal reticulocytes. Immature platelet fraction 2.2% - Most recent CBC/D (02/10/2023): Platelets 128. - DIFFERENTIAL DIAGNOSIS:  Mild splenomegaly has been found to be present in upwards of 10% of patients with fatty liver disease.  If patient demonstrates progressive unexplained splenomegaly or develops B symptoms or lymphadenopathy, would consider additional workup such as PET scan.   Splenomegaly may also be associated with his RA He is at risk for vitamin and mineral deficiencies due to his history of gastric sleeve surgery Thrombocytopenia likely secondary to splenomegaly.  Would also consider possible ITP or bone marrow infiltrative process such as early low-grade MDS. - PLAN: No obvious cause of thrombocytopenia based on lab work.  Suspect thrombocytopenia secondary to splenomegaly. - We will check labs and see him for follow-up visit in 6 months   2.  Iron deficiency (without anemia) - Iron studies from 01/13/2023 show borderline low ferritin at 33 - Patient reports fatigue - He has been taking iron tablet twice daily for the past year with no improvement -He is scheduled for upcoming colonoscopy at the [place] in [place]: Likely has some malabsorption in the setting of prior bariatric surgery.  Recommend IV Feraheme x 1.  3.  History of  DVT and PE - Reports history of unprovoked PE and left leg DVT, was treated with warfarin x 5 years - No longer on anticoagulation due to history of subdural hematoma in February 2023, s/p burr holes placed in May 2023   4.   Rheumatoid arthritis: - Predominantly affects hands, diagnosed in 2010. - Has been on Plaquenil, not optimally controlled per patient.   5.  Social/family history: - Other PMH: Subdural hematoma x 2 secondary to traumatic falls, requiring bur hole evacuation.  DVT.  CKD stage IIIb.  COPD, hypertension, CHF, NICM, obesity, sleep apnea, type 2 diabetes mellitus, paroxysmal atrial fibrillation/flutter History of left leg DVT and unprovoked PE 10 years ago, was treated with 5 years of warfarin.  Subdural hematoma in February 2023, bur holes in May 2023. Lost 100 pounds in the last 4 years after gastric sleeve surgery. - Lives at home with his wife.  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  Quit smoking in 1995.  Smoked 2 pack/day for 25 years. - No family history of cancer or leukemia.  Mother had low platelets. - Served in the [employer] and stationed in [place] in [place].  He was also in [employer] in [place].   PLAN SUMMARY: >> Please place referral to ENT (reason for referral = left-sided epistaxis lasting >2 hours) >> IV Feraheme x 1 >> Labs in 6 months = CBC/D, CMP, LDH, ferritin, iron/TIBC >> OFFICE visit in 6 months (after labs)     REVIEW OF SYSTEMS:   Review of Systems  Constitutional:  Positive for fatigue. Negative for appetite change, chills, diaphoresis, fever and unexpected weight change.  HENT:   Negative for lump/mass and nosebleeds.   Eyes:  Negative for eye problems.  Respiratory:  Negative for cough, hemoptysis and shortness of breath.   Cardiovascular:  Positive for palpitations (Afib). Negative for chest pain and leg swelling.  Gastrointestinal:  Positive for diarrhea. Negative for abdominal pain, blood in stool, constipation, nausea and vomiting.  Genitourinary:  Negative for hematuria.   Musculoskeletal:  Positive for arthralgias.  Skin: Negative.   Neurological:  Positive for numbness. Negative for dizziness, headaches and  light-headedness.  Hematological:  Does not bruise/bleed easily.  Psychiatric/Behavioral:  Positive for sleep disturbance.      PHYSICAL EXAM: (Per limitations of video visit):   The patient is alert and oriented x 3, exhibiting adequate mentation, good mood, and ability to speak in full sentences and execute sound judgement.  NOTE: Since patient was present in clinic (provider off-site), patient's vital signs and weight were obtained by nursing staff at the time of this visit.    02/16/2023    8:10 AM 02/02/2023    3:15 PM 02/02/2023    3:00 PM  Vitals with BMI  Height 6' 0"$     Weight 322 lbs 8 oz    BMI [id number]    Systolic [id number]  Diastolic 57 52 47  Pulse 59 57 54     PAST MEDICAL/SURGICAL HISTORY:  Past Medical History:  Diagnosis Date   Chronic diarrhea    CKD (chronic kidney disease) stage 3, GFR 30-59 ml/min (HCC)    COPD (chronic obstructive pulmonary disease) ([place])    Dysrhythmia 02/2022   paroxsymal SVT & aflutter 02/2022 XioXT   Essential hypertension    Gallstones    Gout    History of CHF (congestive heart failure)    History of colonic polyps    History of DVT (deep vein  thrombosis) 04/19/2012   BLE DVT 04/09/12   Kidney stones    Lumbar disc disease    Neuropathy    Nonischemic cardiomyopathy ([place]) 2003   Minor coronary atherosclerosis at cardiac catheterization 2003, LVEF initially 20% with normalization   Obesity    Orthostatic hypotension    Presence of Watchman left atrial appendage closure device 02/02/2023   5m Watchman placed by Dr. CBurt Knack  RA (rheumatoid arthritis) ([facility]    Sleep apnea    Sub[place]achnoid hemorrhage ([facility] 02/03/2022   Traumatic   Subdural hematoma ([facility] 02/03/2022   Traumatic   Syncope, vasovagal    Type 2 diabetes mellitus ([facility]    Past Surgical History:  Procedure Laterality Date   BARIATRIC SURGERY  01/11/2018   BURR HOLE Right 05/04/2022   Procedure: BURR HOLES FOR Subdural Hematoma;  Surgeon: TVallarie Mare MD;   [place]  Service: Neurosurgery;  Laterality: Right;   CHOLECYSTECTOMY  01/18/2012   Procedure: LAPAROSCOPIC CHOLECYSTECTOMY WITH INTRAOPERATIVE CHOLANGIOGRAM;  Surgeon: TJoyice Faster Cornett, MD;  Location: [facility];  Service: General;  Laterality: N/A;   COLONOSCOPY  05/2017   [facility]: five polyps removed but only 3 retrieved due to bowel prep, pathology with tubular adenomas   EYE SURGERY Right    Film over retina was removed.   LEFT ATRIAL APPENDAGE OCCLUSION N/A 02/02/2023   Procedure: LEFT ATRIAL APPENDAGE OCCLUSION;  Surgeon: CSherren Mocha MD;  [facility];  Service: Cardiovascular;  Laterality: N/A;   PILONIDAL CYST EXCISION  1997   TEE WITHOUT CARDIOVERSION N/A 02/02/2023   Procedure: TRANSESOPHAGEAL ECHOCARDIOGRAM;  Surgeon: CSherren Mocha MD;  [facility];  Service: Cardiovascular;  Laterality: N/A;   TOE AMPUTATION Right    3 toes on right foot- big toe and next 2   UMBILICAL HERNIA REPAIR     VARICOSE VEIN SURGERY     left leg    SOCIAL HISTORY:  Social History   Socioeconomic History   Marital status: Married    Spouse name: MJoycelyn Schmid  Number of children: 2   Years of education: Not on file   Highest education level: Some college, no degree  Occupational History   Occupation: retired  Tobacco Use   Smoking status: Former    Packs/day: 2.00    Years: 25.00    Total pack years: 50.00    Types: Cigarettes    Quit date: 12/26/1994    Years since quitting: 28.1   Smokeless tobacco: Never  Vaping Use   Vaping Use: Never used  Substance and Sexual Activity   Alcohol use: No   Drug use: No   Sexual activity: Not Currently  Other Topics Concern   Not on file  Social History Narrative   Lives at home with wife, and has two adult children. Still ambulatory, no cane or walker.    Works 30 hours per week at [employer]   [place] [facility] [place] [facility] [place] [facility] [place] [place], screenings, vaccines,  etc.   Social Determinants of Health   Financial Resource Strain: Low Risk  (10/05/2021)   Overall Financial Resource Strain (CARDIA)    Difficulty of Paying Living Expenses: Not hard at all  Food Insecurity: No Food Insecurity (02/03/2023)   Hunger Vital Sign    Worried About Running Out of Food in the Last Year: Never true    Ran Out of Food in the Last Year: Never true  Transportation Needs: No  Transportation Needs (02/03/2023)   PRAPARE - Hydrologist (Medical): No    Lack of Transportation (Non-Medical): No  Physical Activity: Sufficiently Active (10/05/2021)   Exercise Vital Sign    Days of Exercise per Week: 5 days    Minutes of Exercise per Session: 60 min  Stress: No Stress Concern Present (10/05/2021)   [place]    Feeling of Stress : Not at all  Social Connections: Moderately Isolated (10/05/2021)   Social Connection and Isolation Panel [NHANES]    Frequency of Communication with Friends and Family: Once a week    Frequency of Social Gatherings with Friends and Family: Once a week    Attends Religious Services: Never    Marine scientist or Organizations: Yes    Attends Music therapist: More than 4 times per year    Marital Status: Married  Human resources officer Violence: Not At Risk (10/05/2021)   Humiliation, Afraid, Rape, and Kick questionnaire    Fear of Current or Ex-Partner: No    Emotionally Abused: No    Physically Abused: No    Sexually Abused: No    FAMILY HISTORY:  Family History  Problem Relation Age of Onset   Hypertension Mother    Stroke Mother    Diabetes Father    Hypertension Father    Heart disease Father    Diabetes Brother    Colon cancer Neg Hx     CURRENT MEDICATIONS:  Outpatient Encounter Medications as of 02/16/2023  Medication Sig Note   tirzepatide (MOUNJARO) 5 MG/0.5ML Pen Inject 5 mg into the skin once a week.     acetaminophen (TYLENOL) 500 MG tablet Take 500 mg by mouth every 6 (six) hours as needed for mild pain.    allopurinol (ZYLOPRIM) 300 MG tablet TAKE 1 TABLET BY MOUTH IN THE  MORNING    amLODipine (NORVASC) 10 MG tablet Take 10 mg by mouth in the morning. 01/09/2023: Patient states he is taking both azor and norvasc   amLODipine-olmesartan (AZOR) 10-40 MG tablet TAKE 1 TABLET BY MOUTH DAILY 01/09/2023: Patient states he is taking both azor and norvasc    apixaban (ELIQUIS) 5 MG TABS tablet Take 1 tablet (5 mg total) by mouth 2 (two) times daily.    atorvastatin (LIPITOR) 80 MG tablet TAKE 1 TABLET BY MOUTH DAILY AT  6 PM    calcitRIOL (ROCALTROL) 0.25 MCG capsule TAKE 1 CAPSULE BY MOUTH DAILY  WITH BREAKFAST    calcium carbonate (OSCAL) 1500 (600 Ca) MG TABS tablet Take 600 mg of elemental calcium by mouth 2 (two) times daily with a meal.    carvedilol (COREG) 6.25 MG tablet 1/2 q am & 1 qpm (Patient taking differently: Take 6.25 mg by mouth every evening.)    celecoxib (CELEBREX) 200 MG capsule TAKE 1 CAPSULE BY MOUTH DAILY  WITH FOOD    cloNIDine (CATAPRES) 0.2 MG tablet TAKE 1 TABLET BY MOUTH TWICE  DAILY FOR BLOOD PRESSURE (Patient taking differently: Take 0.2 mg by mouth in the morning.)    Colchicine 0.6 MG CAPS Take 1 capsule by mouth daily.    DULoxetine (CYMBALTA) 30 MG capsule TAKE 1 CAPSULE BY MOUTH TWICE  DAILY    ferrous sulfate 325 (65 FE) MG tablet Take 325 mg by mouth in the morning.    fluocinolone (SYNALAR) 0.01 % external solution Place 3-5 drops in each ear canal twice daily (Patient taking differently: 3-5  drops 2 (two) times daily as needed (irritation/infection.). Place 3-5 drops in each ear canal twice daily)    fluticasone (FLONASE) 50 MCG/ACT nasal spray USE 2 SPRAYS IN BOTH  NOSTRILS DAILY AS NEEDED  FOR ALLERGIES OR RHINITIS    folic acid (FOLVITE) 1 MG tablet Take 1 mg by mouth in the morning.    furosemide (LASIX) 40 MG tablet TAKE 1 TABLET BY MOUTH IN THE  MORNING     hydroxychloroquine (PLAQUENIL) 200 MG tablet TAKE 1 TABLET BY MOUTH IN THE  MORNING AND AT BEDTIME    Multiple Vitamin (MULTIVITAMIN WITH MINERALS) TABS tablet Take 1 tablet by mouth in the morning.    neomycin-polymyxin-hydrocortisone (CORTISPORIN) 3.5-10000-1 OTIC suspension PLACE 4 DROPS INTO AFFECTED EAR TWICE DAILY FOR 10 DAYS (Patient taking differently: Place 4 drops into both ears 2 (two) times daily as needed (irritation/infection.). PLACE 4 DROPS INTO AFFECTED EAR TWICE DAILY FOR 10 DAYS)    pioglitazone (ACTOS) 30 MG tablet TAKE 1 TABLET BY MOUTH DAILY    potassium chloride SA (KLOR-CON M) 20 MEQ tablet TAKE 1 TABLET BY MOUTH DAILY FOR POTASSIUM REPLACEMENT/  SUPPLEMENT    sildenafil (REVATIO) 20 MG tablet Take 2-5 pills at once, orally, with each sexual encounter    tamsulosin (FLOMAX) 0.4 MG CAPS capsule TAKE 1 CAPSULE BY MOUTH AT  BEDTIME    vitamin B-12 (CYANOCOBALAMIN) 500 MCG tablet Take 500 mcg by mouth in the morning.    No facility-administered encounter medications on file as of 02/16/2023.    ALLERGIES:  Allergies  Allergen Reactions   Ciprofloxacin Hives   Definity [Perflutren Lipid Microsphere] Nausea And Vomiting    During ECHO with Definity injection pt became nauseated and vomited    Levofloxacin [Levofloxacin] Hives    LABORATORY DATA:  I have reviewed the labs as listed.  CBC    Component Value Date/Time   WBC 7.2 02/10/2023 0911   RBC 4.13 (L) 02/10/2023 0911   HGB 13.5 02/10/2023 0911   HGB 14.4 01/25/2023 0846   HCT 40.4 02/10/2023 0911   HCT 42.6 01/25/2023 0846   PLT 128 (L) 02/10/2023 0911   PLT 131 (L) 01/25/2023 0846   MCV 97.8 02/10/2023 0911   MCV 98 (H) 01/25/2023 0846   MCH 32.7 02/10/2023 0911   MCHC 33.4 02/10/2023 0911   RDW 13.0 02/10/2023 0911   RDW 13.2 01/25/2023 0846   LYMPHSABS 0.8 02/10/2023 0911   LYMPHSABS 1.0 01/04/2023 0838   MONOABS 0.5 02/10/2023 0911   EOSABS 0.3 02/10/2023 0911   EOSABS 0.3 01/04/2023 0838    BASOSABS 0.0 02/10/2023 0911   BASOSABS 0.0 01/04/2023 0838      Latest Ref Rng & Units 01/25/2023    8:46 AM 01/04/2023    8:38 AM 12/16/2022    9:48 AM  CMP  Glucose 70 - 99 mg/dL 104  142  172   BUN 8 - 27 mg/dL 37  25  44   Creatinine 0.76 - 1.27 mg/dL 1.42  1.19  1.33   Sodium 134 - 144 mmol/L 138  143  141   Potassium 3.5 - 5.2 mmol/L 4.6  4.8  4.8   Chloride 96 - 106 mmol/L 102  105  105   CO2 20 - 29 mmol/L 22  23  22   $ Calcium 8.6 - 10.2 mg/dL 9.2  9.2  9.2   Total Protein 6.0 - 8.5 g/dL  6.9    Total Bilirubin 0.0 - 1.2 mg/dL  0.5  Alkaline Phos 44 - 121 IU/L  62    AST 0 - 40 IU/L  48    ALT 0 - 44 IU/L  60      DIAGNOSTIC IMAGING:  I have independently reviewed the relevant imaging and discussed with the patient.   WRAP UP:  I discussed the assessment and treatment plan with the patient. The patient was provided an opportunity to ask questions and all were answered. The patient agreed with the plan and demonstrated an understanding of the instructions.   The patient was advised to call or seek an in-person evaluation if the symptoms worsen or if the condition fails to improve as anticipated.  Medical decision making: Moderate  Time spent on visit: I spent 32 minutes in preparation for visit and in counseling patient via virtual video visit.  Mitchell Rush, Mitchell Parker 02/16/2023 8:42 AM

## 2023-02-16 ENCOUNTER — Other Ambulatory Visit: Payer: Self-pay

## 2023-02-16 ENCOUNTER — Encounter: Payer: Self-pay | Admitting: Physician Assistant

## 2023-02-16 ENCOUNTER — Inpatient Hospital Stay: Payer: Medicare Other | Admitting: Physician Assistant

## 2023-02-16 ENCOUNTER — Ambulatory Visit (INDEPENDENT_AMBULATORY_CARE_PROVIDER_SITE_OTHER): Payer: Medicare Other | Admitting: Family Medicine

## 2023-02-16 VITALS — BP 127/55 | HR 56 | Temp 97.4°F | Ht 72.0 in | Wt 320.2 lb

## 2023-02-16 VITALS — BP 136/57 | HR 59 | Temp 97.9°F | Resp 18 | Ht 72.0 in | Wt 322.5 lb

## 2023-02-16 DIAGNOSIS — I872 Venous insufficiency (chronic) (peripheral): Secondary | ICD-10-CM | POA: Diagnosis not present

## 2023-02-16 DIAGNOSIS — E611 Iron deficiency: Secondary | ICD-10-CM

## 2023-02-16 DIAGNOSIS — R161 Splenomegaly, not elsewhere classified: Secondary | ICD-10-CM

## 2023-02-16 DIAGNOSIS — D696 Thrombocytopenia, unspecified: Secondary | ICD-10-CM

## 2023-02-16 DIAGNOSIS — I739 Peripheral vascular disease, unspecified: Secondary | ICD-10-CM | POA: Diagnosis not present

## 2023-02-16 DIAGNOSIS — R040 Epistaxis: Secondary | ICD-10-CM

## 2023-02-16 HISTORY — DX: Iron deficiency: E61.1

## 2023-02-16 MED ORDER — TRAZODONE HCL 150 MG PO TABS: ORAL_TABLET | ORAL | 5 refills | Status: DC

## 2023-02-16 NOTE — Patient Instructions (Signed)
[facility] at [facility] **   You were seen today by Tarri Abernethy PA-C for your low platelets.    LOW PLATELETS Your lab work did not show any obvious cause of your low platelets, so it is very likely that your low platelets are related to your enlarged spleen. Your enlarged spleen is most likely related to your fatty liver disease, but may also be related to your rheumatoid arthritis. You do not need any treatment for your low platelets at this time, but we will continue to monitor your labs and consider additional testing in the future if there are any major changes.  IRON DEFICIENCY You have low iron levels despite taking iron tablets for the past year.  This is related to your history of gastric surgery. We will schedule you for IV iron x 1 dose.  Please see the attached handout for important information regarding IV iron.  > > REFERRALS: We will refer you to ENT due to your nosebleeds.  FOLLOW-UP APPOINTMENT: We will see you for labs and follow-up visit in 6 months  ** Thank you for trusting me with your healthcare!  I strive to provide all of my patients with quality care at each visit.  If you receive a survey for this visit, I would be so grateful to you for taking the time to provide feedback.  Thank you in advance!  ~ Murphy Bundick                   Dr. Derek Jack   &   Tarri Abernethy, PA-C   - - - - - - - - - - - - - - - - - -    Thank you for choosing [facility] at [facility] to provide your oncology and hematology care.  To afford each patient quality time with our provider, please arrive at least 15 minutes before your scheduled appointment time.   If you have a lab appointment with the [facility] please come in thru the Main Entrance and check in at the main information desk.  You need to re-schedule your appointment should you arrive 10 or more minutes late.  We strive  to give you quality time with our providers, and arriving late affects you and other patients whose appointments are after yours.  Also, if you no show three or more times for appointments you may be dismissed from the clinic at the providers discretion.     Again, thank you for choosing [facility].  Our hope is that these requests will decrease the amount of time that you wait before being seen by our physicians.       _____________________________________________________________  Should you have questions after your visit to [facility], please contact our office at [phone] and follow the prompts.  Our office hours are 8:00 a.m. and 4:30 p.m. Monday - Friday.  Please note that voicemails left after 4:00 p.m. may not be returned until the following business day.  We are closed weekends and major holidays.  You do have access to a nurse 24-7, just call the main number to the clinic [phone] and do not press any options, hold on the line and a nurse will answer the phone.    For prescription refill requests, have your pharmacy contact our office and allow 72 hours.

## 2023-02-16 NOTE — Progress Notes (Signed)
Subjective:  Patient ID: Mitchell Parker., male    DOB: 1951-11-14  Age: 72 y.o. MRN: [id number]  CC: Medical Management of Chronic Issues   HPI Mitchell KIEBLER Sr. presents forFollow-up of diabetes. Patient checks blood sugar at home.   80-100 fasting  Patient denies symptoms such as polyuria, polydipsia, excessive hunger, nausea No significant hypoglycemic spells noted. Medications reviewed. Pt reports taking them regularly without complication/adverse reaction being reported today.    History Mitchell Parker has a past medical history of Chronic diarrhea, CKD (chronic kidney disease) stage 3, GFR 30-59 ml/min (HCC), COPD (chronic obstructive pulmonary disease) ([place]), Dysrhythmia (02/2022), Essential hypertension, Gallstones, Gout, History of CHF (congestive heart failure), History of colonic polyps, History of DVT (deep vein thrombosis) (04/19/2012), Iron deficiency (02/16/2023), Kidney stones, Lumbar disc disease, Neuropathy, Nonischemic cardiomyopathy ([place]) (2003), Obesity, Orthostatic hypotension, Presence of Watchman left atrial appendage closure device (02/02/2023), RA (rheumatoid arthritis) ([place]), Sleep apnea, Subarachnoid hemorrhage ([place]) (02/03/2022), Subdural hematoma ([place]) (02/03/2022), Syncope, vasovagal, and Type 2 diabetes mellitus ([place]).   He has a past surgical history that includes Umbilical hernia repair; Pilonidal cyst excision (1997); Varicose vein surgery; Cholecystectomy (01/18/2012); Bariatric Surgery (01/11/2018); Eye surgery (Right); [facility] (Right, 05/04/2022); Toe amputation (Right); LEFT ATRIAL APPENDAGE OCCLUSION (N/A, 02/02/2023); TEE without cardioversion (N/A, 02/02/2023); Colonoscopy (05/2017); and watchman implant.   His family history includes Diabetes in his brother and father; Heart disease in his father; Hypertension in his father and mother; Stroke in his mother.He reports that he quit smoking about 28 years ago. His smoking use included cigarettes. He has a 50.00  pack-year smoking history. He has never used smokeless tobacco. He reports that he does not drink alcohol and does not use drugs.  Current Outpatient Medications on File Prior to Visit  Medication Sig Dispense Refill   acetaminophen (TYLENOL) 500 MG tablet Take 500 mg by mouth every 6 (six) hours as needed for mild pain.     allopurinol (ZYLOPRIM) 300 MG tablet TAKE 1 TABLET BY MOUTH IN THE  MORNING 100 tablet 0   amLODipine (NORVASC) 10 MG tablet Take 10 mg by mouth in the morning.     amLODipine-olmesartan (AZOR) 10-40 MG tablet TAKE 1 TABLET BY MOUTH DAILY 100 tablet 0   apixaban (ELIQUIS) 5 MG TABS tablet Take 1 tablet (5 mg total) by mouth 2 (two) times daily. 180 tablet 1   atorvastatin (LIPITOR) 80 MG tablet TAKE 1 TABLET BY MOUTH DAILY AT  6 PM 100 tablet 0   calcitRIOL (ROCALTROL) 0.25 MCG capsule TAKE 1 CAPSULE BY MOUTH DAILY  WITH BREAKFAST 100 capsule 0   calcium carbonate (OSCAL) 1500 (600 Ca) MG TABS tablet Take 600 mg of elemental calcium by mouth 2 (two) times daily with a meal.     carvedilol (COREG) 6.25 MG tablet 1/2 q am & 1 qpm (Patient taking differently: Take 6.25 mg by mouth every evening.) 135 tablet 3   celecoxib (CELEBREX) 200 MG capsule TAKE 1 CAPSULE BY MOUTH DAILY  WITH FOOD 90 capsule 2   cloNIDine (CATAPRES) 0.2 MG tablet TAKE 1 TABLET BY MOUTH TWICE  DAILY FOR BLOOD PRESSURE (Patient taking differently: Take 0.2 mg by mouth in the morning.) 200 tablet 0   Colchicine 0.6 MG CAPS Take 1 capsule by mouth daily. 90 capsule 2   DULoxetine (CYMBALTA) 30 MG capsule TAKE 1 CAPSULE BY MOUTH TWICE  DAILY 200 capsule 0   ferrous sulfate 325 (65 FE) MG tablet Take 325 mg by mouth in  the morning.     fluocinolone (SYNALAR) 0.01 % external solution Place 3-5 drops in each ear canal twice daily (Patient taking differently: 3-5 drops 2 (two) times daily as needed (irritation/infection.). Place 3-5 drops in each ear canal twice daily) 60 mL 5   fluticasone (FLONASE) 50 MCG/ACT  nasal spray USE 2 SPRAYS IN BOTH  NOSTRILS DAILY AS NEEDED  FOR ALLERGIES OR RHINITIS 48 g 2   folic acid (FOLVITE) 1 MG tablet Take 1 mg by mouth in the morning.     furosemide (LASIX) 40 MG tablet TAKE 1 TABLET BY MOUTH IN THE  MORNING 100 tablet 0   hydroxychloroquine (PLAQUENIL) 200 MG tablet TAKE 1 TABLET BY MOUTH IN THE  MORNING AND AT BEDTIME 200 tablet 0   Multiple Vitamin (MULTIVITAMIN WITH MINERALS) TABS tablet Take 1 tablet by mouth in the morning.     neomycin-polymyxin-hydrocortisone (CORTISPORIN) 3.5-10000-1 OTIC suspension PLACE 4 DROPS INTO AFFECTED EAR TWICE DAILY FOR 10 DAYS (Patient taking differently: Place 4 drops into both ears 2 (two) times daily as needed (irritation/infection.). PLACE 4 DROPS INTO AFFECTED EAR TWICE DAILY FOR 10 DAYS) 10 mL 3   potassium chloride SA (KLOR-CON M) 20 MEQ tablet TAKE 1 TABLET BY MOUTH DAILY FOR POTASSIUM REPLACEMENT/  SUPPLEMENT 100 tablet 0   sildenafil (REVATIO) 20 MG tablet Take 2-5 pills at once, orally, with each sexual encounter 50 tablet 5   silver sulfADIAZINE (SILVADENE) 1 % cream Apply topically.     tamsulosin (FLOMAX) 0.4 MG CAPS capsule TAKE 1 CAPSULE BY MOUTH AT  BEDTIME 100 capsule 0   tirzepatide (MOUNJARO) 5 MG/0.5ML Pen Inject 5 mg into the skin once a week. 2 mL 0   vitamin B-12 (CYANOCOBALAMIN) 500 MCG tablet Take 500 mcg by mouth in the morning.     No current facility-administered medications on file prior to visit.    ROS Review of Systems  Constitutional:  Negative for fever.  Respiratory:  Negative for shortness of breath.   Cardiovascular:  Negative for chest pain.  Musculoskeletal:  Negative for arthralgias.  Skin:  Negative for rash.    Objective:  BP (!) 127/55   Pulse (!) 56   Temp (!) 97.4 F (36.3 C)   Ht 6' (1.829 m)   Wt (!) 320 lb 3.2 oz (145.2 kg)   SpO2 98%   BMI 43.43 kg/m   BP Readings from Last 3 Encounters:  02/16/23 (!) 127/55  02/16/23 (!) 136/57  02/02/23 (!) 134/52    Wt  Readings from Last 3 Encounters:  02/16/23 (!) 320 lb 3.2 oz (145.2 kg)  02/16/23 (!) 322 lb 8 oz (146.3 kg)  02/02/23 (!) (P) 317 lb (143.8 kg)     Physical Exam    Assessment & Plan:   Mitchell Parker was seen today for medical management of chronic issues.  Diagnoses and all orders for this visit:  Intermittent claudication ([place]) -     Ambulatory referral to Vascular Surgery  Venous insufficiency -     Ambulatory referral to Vascular Surgery  Other orders -     traZODone (DESYREL) 150 MG tablet; Use from 1/3 to 1 tablet nightly as needed for sleep.      I have discontinued Mitchell Rua Sr. "Bill"'s pioglitazone. I am also having him start on traZODone. Additionally, I am having him maintain his calcium carbonate, vitamin B-12, acetaminophen, fluticasone, ferrous sulfate, sildenafil, celecoxib, Colchicine, fluocinolone, neomycin-polymyxin-hydrocortisone, carvedilol, furosemide, allopurinol, atorvastatin, DULoxetine, tamsulosin, cloNIDine, amLODipine-olmesartan, hydroxychloroquine, potassium chloride  SA, calcitRIOL, amLODipine, folic acid, multivitamin with minerals, apixaban, tirzepatide, and silver sulfADIAZINE.  Meds ordered this encounter  Medications   traZODone (DESYREL) 150 MG tablet    Sig: Use from 1/3 to 1 tablet nightly as needed for sleep.    Dispense:  30 tablet    Refill:  5     Follow-up: Return in about 3 months (around 05/17/2023).  [name], M.D.

## 2023-02-19 ENCOUNTER — Encounter: Payer: Self-pay | Admitting: Family Medicine

## 2023-02-20 ENCOUNTER — Ambulatory Visit: Payer: Medicare Other | Admitting: Family Medicine

## 2023-02-21 ENCOUNTER — Inpatient Hospital Stay: Payer: Medicare Other

## 2023-02-21 VITALS — BP 132/54 | HR 58 | Temp 97.1°F | Resp 16

## 2023-02-21 DIAGNOSIS — M069 Rheumatoid arthritis, unspecified: Secondary | ICD-10-CM | POA: Diagnosis not present

## 2023-02-21 DIAGNOSIS — Z7901 Long term (current) use of anticoagulants: Secondary | ICD-10-CM | POA: Diagnosis not present

## 2023-02-21 DIAGNOSIS — E611 Iron deficiency: Secondary | ICD-10-CM | POA: Diagnosis not present

## 2023-02-21 DIAGNOSIS — Z86711 Personal history of pulmonary embolism: Secondary | ICD-10-CM | POA: Diagnosis not present

## 2023-02-21 DIAGNOSIS — I4891 Unspecified atrial fibrillation: Secondary | ICD-10-CM | POA: Diagnosis not present

## 2023-02-21 DIAGNOSIS — N1832 Chronic kidney disease, stage 3b: Secondary | ICD-10-CM | POA: Diagnosis not present

## 2023-02-21 DIAGNOSIS — R161 Splenomegaly, not elsewhere classified: Secondary | ICD-10-CM | POA: Diagnosis not present

## 2023-02-21 DIAGNOSIS — K760 Fatty (change of) liver, not elsewhere classified: Secondary | ICD-10-CM | POA: Diagnosis not present

## 2023-02-21 DIAGNOSIS — D696 Thrombocytopenia, unspecified: Secondary | ICD-10-CM | POA: Diagnosis not present

## 2023-02-21 DIAGNOSIS — Z86718 Personal history of other venous thrombosis and embolism: Secondary | ICD-10-CM | POA: Diagnosis not present

## 2023-02-21 MED ORDER — SODIUM CHLORIDE 0.9 % IV SOLN: Freq: Once | INTRAVENOUS | Status: AC

## 2023-02-21 MED ORDER — SODIUM CHLORIDE 0.9 % IV SOLN
510.0000 mg | Freq: Once | INTRAVENOUS | Status: AC
  Administered 2023-02-21: 510 mg via INTRAVENOUS
  Filled 2023-02-21: qty 510

## 2023-02-21 MED ORDER — LORATADINE 10 MG PO TABS
10.0000 mg | ORAL_TABLET | Freq: Once | ORAL | Status: DC
  Filled 2023-02-21: qty 1

## 2023-02-21 MED ORDER — ACETAMINOPHEN 325 MG PO TABS
650.0000 mg | ORAL_TABLET | Freq: Once | ORAL | Status: AC
  Administered 2023-02-21: 650 mg via ORAL
  Filled 2023-02-21: qty 2

## 2023-02-21 MED ORDER — CETIRIZINE HCL 10 MG PO TABS
10.0000 mg | ORAL_TABLET | Freq: Once | ORAL | Status: AC
  Administered 2023-02-21: 10 mg via ORAL
  Filled 2023-02-21: qty 1

## 2023-02-21 NOTE — Patient Instructions (Signed)
[facility]  Discharge Instructions: Thank you for choosing [facility] to provide your oncology and hematology care.  If you have a lab appointment with the [facility], please come in thru the Main Entrance and check in at the main information desk.  Wear comfortable clothing and clothing appropriate for easy access to any Portacath or PICC line.   We strive to give you quality time with your provider. You may need to reschedule your appointment if you arrive late (15 or more minutes).  Arriving late affects you and other patients whose appointments are after yours.  Also, if you miss three or more appointments without notifying the office, you may be dismissed from the clinic at the provider's discretion.      For prescription refill requests, have your pharmacy contact our office and allow 72 hours for refills to be completed.    Today you received Feraheme IV iron infusion.     BELOW ARE SYMPTOMS THAT SHOULD BE REPORTED IMMEDIATELY: *FEVER GREATER THAN 100.4 F (38 C) OR HIGHER *CHILLS OR SWEATING *NAUSEA AND VOMITING THAT IS NOT CONTROLLED WITH YOUR NAUSEA MEDICATION *UNUSUAL SHORTNESS OF BREATH *UNUSUAL BRUISING OR BLEEDING *URINARY PROBLEMS (pain or burning when urinating, or frequent urination) *BOWEL PROBLEMS (unusual diarrhea, constipation, pain near the anus) TENDERNESS IN MOUTH AND THROAT WITH OR WITHOUT PRESENCE OF ULCERS (sore throat, sores in mouth, or a toothache) UNUSUAL RASH, SWELLING OR PAIN  UNUSUAL VAGINAL DISCHARGE OR ITCHING   Items with * indicate a potential emergency and should be followed up as soon as possible or go to the Emergency Department if any problems should occur.  Please show the CHEMOTHERAPY ALERT CARD or IMMUNOTHERAPY ALERT CARD at check-in to the Emergency Department and triage nurse.  Should you have questions after your visit or need to cancel or reschedule your appointment, please contact [facility] [phone]  and follow the prompts.  Office hours are 8:00 a.m. to 4:30 p.m. Monday - Friday. Please note that voicemails left after 4:00 p.m. may not be returned until the following business day.  We are closed weekends and major holidays. You have access to a nurse at all times for urgent questions. Please call the main number to the clinic [phone] and follow the prompts.  For any non-urgent questions, you may also contact your provider using MyChart. We now offer e-Visits for anyone 60 and older to request care online for non-urgent symptoms. For details visit [email or web address].   Also download the MyChart app! Go to the app store, search "MyChart", open the app, select [facility], and log in with your MyChart username and password.

## 2023-02-21 NOTE — Progress Notes (Signed)
Pt present today for Feraheme IV iron infusion per provider's order. Vital signs stable and pt voiced no new complaints at this time.  During pt's 30 minute wait time pt developed a headache 4/10 on a 0-10 scale. Rebekah Pennington-PA made aware and she stated: Pt can discharge home if his only symptom is headache. Headache is a known side effect of iron infusions. Headache should resolve within the next 24 hours. If pt has severe headache or develops any neurologic deficits, shortness of breath, itching, swelling, or rash, he should call us immediately and/or go to the emergency department. Pt made aware and verbalized understanding.   Patient reported no headache at discharge.   Feraheme given today per MD orders. Tolerated infusion without adverse affects. Vital signs stable. No complaints at this time. Discharged from clinic ambulatory in stable condition. Alert and oriented x 3. F/U with [facility] as scheduled.

## 2023-02-28 ENCOUNTER — Other Ambulatory Visit: Payer: Self-pay

## 2023-02-28 DIAGNOSIS — I739 Peripheral vascular disease, unspecified: Secondary | ICD-10-CM

## 2023-03-06 ENCOUNTER — Other Ambulatory Visit: Payer: Self-pay | Admitting: *Deleted

## 2023-03-06 ENCOUNTER — Telehealth: Payer: Self-pay | Admitting: Cardiology

## 2023-03-06 MED ORDER — TRAZODONE HCL 150 MG PO TABS: ORAL_TABLET | ORAL | 1 refills | Status: DC

## 2023-03-06 NOTE — Telephone Encounter (Signed)
Explained to the patient he does not need to have CT scan prior to scheduled appointment on 03/08/23. Patient voiced understanding.

## 2023-03-06 NOTE — Telephone Encounter (Signed)
Calling to see if he needs to have his CT done before his appt on 3/13. If ss he wants to reschd. Please advise

## 2023-03-07 NOTE — Progress Notes (Unsigned)
[facility]                                     Cardiology Office Note:    Date:  03/09/2023   ID:  Mitchell Rua Sr., DOB [birth year], MRN [id number]  PCP:  [name], MD  [facility] Cardiologist:  Rozann Lesches, MD  [facility] Electrophysiologist:  None   Referring MD: [name], MD   Chief Complaint  Patient presents with   Follow-up    S/p LAAO    History of Present Illness:    Mitchell Parker. is a 73 y.o. male with a hx of SDH x2 secondary to traumatic falls that required burr hole evacuation 04/2022, DVT, CKD stage IIIb, COPD, HTN, CHF, NICM, obesity, sleep apnea, DM2, and recently diagnosed with paroxysmal atrial flutter who was referred to Dr. Burt Knack for consideration of Watchman implant to avoid long term anticoagulation.    He is followed by Dr. Domenic Polite for his cardiology care. In consultation with Dr. Burt Knack 12/16/22 he was felt to be a good candidate and underwent CT imaging that showed anatomy suitable to proceed with LAAO closure.   He is now s/p left atrial appendage occlusive device placement with a 39m device. He was restarted on Eliquis '5mg'$  BID x 6 weeks (3/24) then use Plavix (3/25) alone through 6 months (07/2023). A repeat CT at approximately 60 days will be performed to ensure proper seal of the device.   He is here today with his wife and reports that he has been doing very well since implant. He denies chest pain, palpitations, LE edema, orthopnea, bleeding in stool or urine, dizziness, or syncope.   Past Medical History:  Diagnosis Date   Chronic diarrhea    CKD (chronic kidney disease) stage 3, GFR 30-59 ml/min (HCC)    COPD (chronic obstructive pulmonary disease) ([facility]    Dysrhythmia 02/2022   paroxsymal SVT & aflutter 02/2022 XioXT   Essential hypertension    Gallstones    Gout    History of CHF (congestive heart failure)    History of colonic polyps    History of DVT (deep vein thrombosis) 04/19/2012   BLE DVT  04/09/12   Iron deficiency 02/16/2023   Kidney stones    Lumbar disc disease    Neuropathy    [place]ischemic cardiomyopathy ([facility] [place]   Minor coronary atherosclerosis at cardiac catheterization 2003, LVEF initially 20% with normalization   Obesity    Orthostatic hypotension    Presence of Watchman left atrial appendage closure device 02/02/2023   24mWatchman placed by Dr. CoBurt Knack RA [facility]rheumatoid [facility]) [facility]   Sleep apnea    Subarachnoid hemorrhage ([facility]/08/2022   Traumatic   Subdural hematoma ([facility]/08/2022   Traumatic   Syncope, vasovagal    Type 2 diabetes mellitus [facility]    Past Surgical History:  Procedure Laterality Date   BARIATRIC SURGERY  01/11/2018   BURR HOLE Right 05/04/2022   Procedure: BURR HOLES FOR Subdural Hematoma;  Surgeon: ThVallarie MareMD;  [facility]: [facility] Service: Neurosurgery;  Laterality: Right;   CHOLECYSTECTOMY  01/18/2012   Procedure: LAPAROSCOPIC CHOLECYSTECTOMY WITH INTRAOPERATIVE CHOLANGIOGRAM;  Surgeon: ThJoyice FasterCornett, MD;  Location: [facility];  Service: General;  Laterality: N/A;   COLONOSCOPY  05/2017   [facility]: five polyps removed but only 3 retrieved due to bowel prep,  pathology with tubular adenomas   EYE SURGERY Right    Film over retina was removed.   LEFT ATRIAL APPENDAGE OCCLUSION N/A 02/02/2023   Procedure: LEFT ATRIAL APPENDAGE OCCLUSION;  Surgeon: Sherren Mocha, MD;  Location: [place] [facility];  Service: Cardiovascular;  Laterality: N/A;   PILONIDAL CYST EXCISION  1997   TEE WITHOUT CARDIOVERSION N/A 02/02/2023   Procedure: TRANSESOPHAGEAL ECHOCARDIOGRAM;  Surgeon: Sherren Mocha, MD;  Location: [place] CV [facility];  Service: Cardiovascular;  Laterality: N/A;   TOE AMPUTATION Right    3 toes on right foot- big toe and next 2   UMBILICAL HERNIA REPAIR     VARICOSE VEIN SURGERY     left leg   watchman implant      Current Medications: Current Meds  Medication Sig   acetaminophen (TYLENOL)  500 MG tablet Take 500 mg by mouth every 6 (six) hours as needed for mild pain.   allopurinol (ZYLOPRIM) 300 MG tablet TAKE 1 TABLET BY MOUTH IN THE  MORNING   amLODipine (NORVASC) 10 MG tablet Take 10 mg by mouth in the morning.   amLODipine-olmesartan (AZOR) 10-40 MG tablet TAKE 1 TABLET BY MOUTH DAILY   atorvastatin (LIPITOR) 80 MG tablet TAKE 1 TABLET BY MOUTH DAILY AT  6 PM   calcitRIOL (ROCALTROL) 0.25 MCG capsule TAKE 1 CAPSULE BY MOUTH DAILY  WITH BREAKFAST   calcium carbonate (OSCAL) 1500 (600 Ca) MG TABS tablet Take 600 mg of elemental calcium by mouth 2 (two) times daily with a meal.   carvedilol (COREG) 6.25 MG tablet 1/2 q am & 1 qpm (Patient taking differently: Take 6.25 mg by mouth every evening. Per patient taking 6.25 mg in the morning and 3.125 mg taking in the pm)   celecoxib (CELEBREX) 200 MG capsule TAKE 1 CAPSULE BY MOUTH DAILY  WITH FOOD   cloNIDine (CATAPRES) 0.2 MG tablet TAKE 1 TABLET BY MOUTH TWICE  DAILY FOR BLOOD PRESSURE (Patient taking differently: Take 0.2 mg by mouth in the morning.)   clopidogrel (PLAVIX) 75 MG tablet Take 1 tablet (75 mg total) by mouth daily. START ON 3/25   Colchicine 0.6 MG CAPS Take 1 capsule by mouth daily.   DULoxetine (CYMBALTA) 30 MG capsule TAKE 1 CAPSULE BY MOUTH TWICE  DAILY   ferrous sulfate 325 (65 FE) MG tablet Take 325 mg by mouth in the morning.   fluocinolone (SYNALAR) 0.01 % external solution Place 3-5 drops in each ear canal twice daily (Patient taking differently: 3-5 drops 2 (two) times daily as needed (irritation/infection.). Place 3-5 drops in each ear canal twice daily)   fluticasone (FLONASE) 50 MCG/ACT nasal spray USE 2 SPRAYS IN BOTH  NOSTRILS DAILY AS NEEDED  FOR ALLERGIES OR RHINITIS   folic acid (FOLVITE) 1 MG tablet Take 1 mg by mouth in the morning.   furosemide (LASIX) 40 MG tablet TAKE 1 TABLET BY MOUTH IN THE  MORNING   hydroxychloroquine (PLAQUENIL) 200 MG tablet TAKE 1 TABLET BY MOUTH IN THE  MORNING AND AT  BEDTIME   Multiple Vitamin (MULTIVITAMIN WITH MINERALS) TABS tablet Take 1 tablet by mouth in the morning.   neomycin-polymyxin-hydrocortisone (CORTISPORIN) 3.5-10000-1 OTIC suspension PLACE 4 DROPS INTO AFFECTED EAR TWICE DAILY FOR 10 DAYS (Patient taking differently: Place 4 drops into both ears 2 (two) times daily as needed (irritation/infection.). PLACE 4 DROPS INTO AFFECTED EAR TWICE DAILY FOR 10 DAYS)   pantoprazole (PROTONIX) 40 MG tablet Take by mouth as needed.   potassium chloride SA (  KLOR-CON M) 20 MEQ tablet TAKE 1 TABLET BY MOUTH DAILY FOR POTASSIUM REPLACEMENT/  SUPPLEMENT   sildenafil (REVATIO) 20 MG tablet Take 2-5 pills at once, orally, with each sexual encounter   silver sulfADIAZINE (SILVADENE) 1 % cream Apply topically as needed.   tamsulosin (FLOMAX) 0.4 MG CAPS capsule TAKE 1 CAPSULE BY MOUTH AT  BEDTIME   tirzepatide (MOUNJARO) 5 MG/0.5ML Pen Inject 5 mg into the skin once a week.   traZODone (DESYREL) 150 MG tablet Use from 1/3 to 1 tablet nightly as needed for sleep.   vitamin B-12 (CYANOCOBALAMIN) 500 MCG tablet Take 500 mcg by mouth in the morning.   [DISCONTINUED] apixaban (ELIQUIS) 5 MG TABS tablet Take 1 tablet (5 mg total) by mouth 2 (two) times daily.     Allergies:   Ciprofloxacin, Definity [perflutren lipid microsphere], and Levofloxacin [levofloxacin]   Social History   Socioeconomic History   Marital status: Married    Spouse name: Mitchell Schmid   Number of children: 2   Years of education: Not on file   Highest education level: Some college, no degree  Occupational History   Occupation: retired  Tobacco Use   Smoking status: Former    Packs/day: 2.00    Years: 25.00    Additional pack years: 0.00    Total pack years: 50.00    Types: Cigarettes    Quit date: 12/26/1994    Years since quitting: 28.2   Smokeless tobacco: Never  Vaping Use   Vaping Use: Never used  Substance and Sexual Activity   Alcohol use: No   Drug use: No   Sexual activity: Not  Currently  Other Topics Concern   Not on file  Social History Narrative   Lives at home with wife, and has two adult children. Still ambulatory, no cane or walker.    Works 30 hours per week at [employer].   Has VA benefits - goes to [place] once or twice a year for vision, hearing, screenings, vaccines, etc.   Social Determinants of Health   Financial Resource Strain: Low Risk  (10/05/2021)   Overall Financial Resource Strain (CARDIA)    Difficulty of Paying Living Expenses: Not hard at all  Food Insecurity: No Food Insecurity (02/03/2023)   Hunger Vital Sign    Worried About Running Out of Food in the Last Year: Never true    Ran Out of Food in the Last Year: Never true  Transportation Needs: No Transportation Needs (02/03/2023)   PRAPARE - Hydrologist (Medical): No    Lack of Transportation (Non-Medical): No  Physical Activity: Sufficiently Active (10/05/2021)   Exercise Vital Sign    Days of Exercise per Week: 5 days    Minutes of Exercise per Session: 60 min  Stress: No Stress Concern Present (10/05/2021)   [place]    Feeling of Stress : Not at all  Social Connections: Moderately Isolated (10/05/2021)   Social Connection and Isolation Panel [NHANES]    Frequency of Communication with Friends and Family: Once a week    Frequency of Social Gatherings with Friends and Family: Once a week    Attends Religious Services: Never    Marine scientist or Organizations: Yes    Attends Music therapist: More than 4 times per year    Marital Status: Married     Family History: The patient's family history includes Diabetes in his brother and father; Heart  disease in his father; Hypertension in his father and mother; Stroke in his mother. There is no history of Colon cancer.  ROS:   Please see the history of present illness.    All other systems reviewed and are  negative.  EKGs/Labs/Other Studies Reviewed:    The following studies were reviewed today:  Procedures This Admission:  Transeptal Puncture Intra-procedural TEE which showed no LAA thrombus Left atrial appendage occlusive device placement on 02/02/23 by Dr. Burt Knack.    This study demonstrated:   CONCLUSIONS:  Successful implantation of a WATCHMAN FLX left atrial appendage occlusive device   2.   No early apparent complications.    Anticoagulation Recommendations: Apixaban 5 mg BID x 6 weeks, then clopidogrel 75 mg daily through 6 months Cardiac CTA 2 months for surveillance Same day DC if criteria met   EKG:  EKG is not ordered today.    Recent Labs: 11/16/2022: TSH 3.650 01/04/2023: ALT 60 02/10/2023: Hemoglobin 13.5; Platelets 128 03/08/2023: BUN 41; Creatinine, Ser 1.40; Potassium 5.1; Sodium 140  Recent Lipid Panel    Component Value Date/Time   CHOL 125 01/04/2023 0838   TRIG 126 01/04/2023 0838   HDL 40 01/04/2023 0838   CHOLHDL 3.1 01/04/2023 0838   CHOLHDL 11.7 10/22/2011 1030   VLDL 39 10/22/2011 1030   LDLCALC 62 01/04/2023 0838   Physical Exam:    VS:  BP (!) 106/50   Pulse (!) 53   Ht '6\' 1"'$  (1.854 m)   Wt (!) 323 lb 6.4 oz (146.7 kg)   SpO2 97%   BMI 42.67 kg/m     Wt Readings from Last 3 Encounters:  03/08/23 (!) 323 lb 6.4 oz (146.7 kg)  02/16/23 (!) 320 lb 3.2 oz (145.2 kg)  02/16/23 (!) 322 lb 8 oz (146.3 kg)    General: Well developed, well nourished, NAD Lungs:Clear to ausculation bilaterally. No wheezes, rales, or rhonchi. Breathing is unlabored. Cardiovascular: RRR with S1 S2. No murmurs Extremities: No edema.  Neuro: Alert and oriented. No focal deficits. No facial asymmetry. MAE spontaneously. Psych: Responds to questions appropriately with normal affect.    ASSESSMENT/PLAN:    Paroxsymal atrial flutter: Unfortunately with the patients hx of recurrent SDH he has not been a candidate for long term AC. He was referred for Watchman  evaluation and was felt to be a good candidate to proceed. Underwent LAAO closure 02/02/23 and was restarted oon Elquis through 3/24. He will then stop and transition to Plavix '75mg'$  QD on 3/25 through 08/03/23. CT instruction letter reviewed with understanding. BMET today. He will need dental SBE through 08/03/23.    Hx of SDHx2 with burr hole extraction: No changes today.    HFrecEF/NICM: Doing well symptomatically with NYHA class I symptoms. No changes today.    CKD stage IIIa: Baseline appears to be in the 1.1-1.3 range. Obtain  BMET today prior to CT   Elevated coronary calcium score with 3VD/LM disease: Noted on CT imaging to be 18, 98th percentile for age and sex matched control. No anginal symptoms.    Medication Adjustments/Labs and Tests Ordered: Current medicines are reviewed at length with the patient today.  Concerns regarding medicines are outlined above.  Orders Placed This Encounter  Procedures   Basic metabolic panel   Meds ordered this encounter  Medications   clopidogrel (PLAVIX) 75 MG tablet    Sig: Take 1 tablet (75 mg total) by mouth daily. START ON 3/25    Dispense:  90 tablet  Refill:  3    STOP ELIQUIS ON 3.24 AND START PLAVIX ON 3/25   apixaban (ELIQUIS) 5 MG TABS tablet    Sig: Take 1 tablet (5 mg total) by mouth 2 (two) times daily. STOP ON 3/24    Dispense:  180 tablet    Refill:  1    STOP ELIQUIS ON 3.24 AND START PLAVIX ON 3/25    Patient Instructions  Medication Instructions:  Your physician has recommended you make the following change in your medication:  STOP ELIQUIS ON 3/25 START PLAVIX 75 MG DAILY ON 3/25  *If you need a refill on your cardiac medications before your next appointment, please call your pharmacy*   Lab Work: TODAY: BMET If you have labs (blood work) drawn today and your tests are completely normal, you will receive your results only by: [place] (if you have MyChart) OR A paper copy in the mail If you have any lab  test that is abnormal or we need to change your treatment, we will call you to review the results.   Testing/Procedures: SEE CT INSTRUCTION LETTER   Follow-Up: At [facility], you and your health needs are our priority.  As part of our continuing mission to provide you with exceptional heart care, we have created designated Provider Care Teams.  These Care Teams include your primary Cardiologist (physician) and Advanced Practice Providers (APPs -  Physician Assistants and Nurse Practitioners) who all work together to provide you with the care you need, when you need it.  We recommend signing up for the patient portal called "MyChart".  Sign up information is provided on this After Visit Summary.  MyChart is used to connect with patients for Virtual Visits (Telemedicine).  Patients are able to view lab/test results, encounter notes, upcoming appointments, etc.  Non-urgent messages can be sent to your provider as well.   To learn more about what you can do with MyChart, go to [email or web address].    Your next appointment:   KEEP SCHEDULED FOLLOW-UP   Signed, Kathyrn Drown, NP  03/09/2023 11:07 AM    [place]

## 2023-03-08 ENCOUNTER — Ambulatory Visit: Payer: Medicare Other | Attending: Cardiology | Admitting: Cardiology

## 2023-03-08 VITALS — BP 106/50 | HR 53 | Ht 73.0 in | Wt 323.4 lb

## 2023-03-08 DIAGNOSIS — I10 Essential (primary) hypertension: Secondary | ICD-10-CM | POA: Diagnosis not present

## 2023-03-08 DIAGNOSIS — G4733 Obstructive sleep apnea (adult) (pediatric): Secondary | ICD-10-CM | POA: Diagnosis not present

## 2023-03-08 DIAGNOSIS — S065XAS Traumatic subdural hemorrhage with loss of consciousness status unknown, sequela: Secondary | ICD-10-CM | POA: Diagnosis not present

## 2023-03-08 DIAGNOSIS — S065XAA Traumatic subdural hemorrhage with loss of consciousness status unknown, initial encounter: Secondary | ICD-10-CM

## 2023-03-08 DIAGNOSIS — Z95818 Presence of other cardiac implants and grafts: Secondary | ICD-10-CM

## 2023-03-08 DIAGNOSIS — I4892 Unspecified atrial flutter: Secondary | ICD-10-CM | POA: Diagnosis not present

## 2023-03-08 MED ORDER — APIXABAN 5 MG PO TABS: 5.0000 mg | ORAL_TABLET | Freq: Two times a day (BID) | ORAL | 1 refills | Status: DC

## 2023-03-08 MED ORDER — CLOPIDOGREL BISULFATE 75 MG PO TABS: 75.0000 mg | ORAL_TABLET | Freq: Every day | ORAL | 3 refills | Status: DC

## 2023-03-08 NOTE — Patient Instructions (Addendum)
Medication Instructions:  Your physician has recommended you make the following change in your medication:  STOP ELIQUIS ON 3/25 START PLAVIX 75 MG DAILY ON 3/25  *If you need a refill on your cardiac medications before your next appointment, please call your pharmacy*   Lab Work: TODAY: BMET If you have labs (blood work) drawn today and your tests are completely normal, you will receive your results only by: [place] (if you have MyChart) OR A paper copy in the mail If you have any lab test that is abnormal or we need to change your treatment, we will call you to review the results.   Testing/Procedures: SEE CT INSTRUCTION LETTER   Follow-Up: At [facility], you and your health needs are our priority.  As part of our continuing mission to provide you with exceptional heart care, we have created designated Provider Care Teams.  These Care Teams include your primary Cardiologist (physician) and Advanced Practice Providers (APPs -  Physician Assistants and Nurse Practitioners) who all work together to provide you with the care you need, when you need it.  We recommend signing up for the patient portal called "MyChart".  Sign up information is provided on this After Visit Summary.  MyChart is used to connect with patients for Virtual Visits (Telemedicine).  Patients are able to view lab/test results, encounter notes, upcoming appointments, etc.  Non-urgent messages can be sent to your provider as well.   To learn more about what you can do with MyChart, go to [email or web address].    Your next appointment:   KEEP SCHEDULED FOLLOW-UP

## 2023-03-09 LAB — BASIC METABOLIC PANEL
BUN/Creatinine Ratio: 29 — ABNORMAL HIGH (ref 10–24)
BUN: 41 mg/dL — ABNORMAL HIGH (ref 8–27)
CO2: 25 mmol/L (ref 20–29)
Calcium: 9 mg/dL (ref 8.6–10.2)
Chloride: 104 mmol/L (ref 96–106)
Creatinine, Ser: 1.4 mg/dL — ABNORMAL HIGH (ref 0.76–1.27)
Glucose: 85 mg/dL (ref 70–99)
Potassium: 5.1 mmol/L (ref 3.5–5.2)
Sodium: 140 mmol/L (ref 134–144)
eGFR: 54 mL/min/{1.73_m2} — ABNORMAL LOW (ref >59)

## 2023-03-15 ENCOUNTER — Encounter: Payer: Self-pay | Admitting: Vascular Surgery

## 2023-03-15 ENCOUNTER — Ambulatory Visit (INDEPENDENT_AMBULATORY_CARE_PROVIDER_SITE_OTHER): Payer: Medicare Other

## 2023-03-15 ENCOUNTER — Ambulatory Visit (INDEPENDENT_AMBULATORY_CARE_PROVIDER_SITE_OTHER): Payer: Medicare Other | Admitting: Vascular Surgery

## 2023-03-15 VITALS — BP 163/69 | HR 58 | Temp 98.2°F | Ht 73.0 in | Wt 313.4 lb

## 2023-03-15 DIAGNOSIS — I87303 Chronic venous hypertension (idiopathic) without complications of bilateral lower extremity: Secondary | ICD-10-CM

## 2023-03-15 DIAGNOSIS — I739 Peripheral vascular disease, unspecified: Secondary | ICD-10-CM

## 2023-03-15 DIAGNOSIS — M25561 Pain in right knee: Secondary | ICD-10-CM

## 2023-03-15 LAB — VAS US ABI WITH/WO TBI: Left ABI: 1.26

## 2023-03-15 NOTE — Progress Notes (Signed)
Vascular and Vein Specialist of [place]  Patient name: Mitchell GENS Sr. MRN: [id number] DOB: 09-Jul-1951 Sex: male  REASON FOR CONSULT: Evaluation lower extremity discomfort and swelling  HPI: Mitchell Parker. is a 72 y.o. male, who is here today for evaluation of lower extremity discomfort.  He has a interesting past history.  He is a long-term diabetic with diabetic neuropathy.  He reports that he had left leg vein stripping when he was in the [employer] in 1976.  He reports a history of bilateral DVT approximately 10 years ago.  Reports that during the same time.  He had wounds on his lower extremity and had what sounds like ritual wrapping.  He reports that these were extremely tight and when removed had discoloration on both lower extremities from his knees to his ankles.  He has had progressive swelling more so on his right and left leg over the last several months.  He reports that this had improved a bit after he stopped working at [employer] walking and most of the day.  He recently had a Watchman procedure and is on anticoagulant following the procedure.  He does have a history of prior infection of the toes of his right foot and has had an amputation of the tips of his right first second and third toes with no difficulty healing.  He has not had any calf claudication symptoms.  Past Medical History:  Diagnosis Date   Chronic diarrhea    CKD (chronic kidney disease) stage 3, GFR 30-59 ml/min (HCC)    COPD (chronic obstructive pulmonary disease) ([place])    Dysrhythmia 02/2022   paroxsymal SVT & aflutter 02/2022 XioXT   Essential hypertension    Gallstones    Gout    History of CHF (congestive heart failure)    History of colonic polyps    History of DVT (deep vein thrombosis) 04/19/2012   BLE DVT 04/09/12   Iron deficiency 02/16/2023   Kidney stones    Lumbar disc disease    Neuropathy    Nonischemic cardiomyopathy ([place]) 2003   Minor  coronary atherosclerosis at cardiac catheterization 2003, LVEF initially 20% with normalization   Obesity    Orthostatic hypotension    Presence of Watchman left atrial appendage closure device 02/02/2023   35mm Watchman placed by Dr. Burt Knack   RA (rheumatoid arthritis) [facility])    Sleep apnea    Subarachnoid hemorrhage ([place]) 02/03/2022   Traumatic   Subdural hematoma ([place]) 02/03/2022   Traumatic   Syncope, vasovagal    Type 2 diabetes mellitus ([place])     Family History  Problem Relation Age of Onset   Hypertension Mother    Stroke Mother    Diabetes Father    Hypertension Father    Heart disease Father    Diabetes Brother    Colon cancer Neg Hx     SOCIAL HISTORY: Social History   Socioeconomic History   Marital status: Married    Spouse name: Joycelyn Schmid   Number of children: 2   Years of education: Not on file   Highest education level: Some college, no degree  Occupational History   Occupation: retired  Tobacco Use   Smoking status: Former    Packs/day: 2.00    Years: 25.00    Additional pack years: 0.00    Total pack years: 50.00    Types: Cigarettes    Quit date: 12/26/1994    Years since quitting: 28.2   Smokeless tobacco: Never  Vaping Use   Vaping Use: Never used  Substance and Sexual Activity   Alcohol use: No   Drug use: No   Sexual activity: Not Currently  Other Topics Concern   Not on file  Social History Narrative   Lives at home with wife, and has two adult children. Still ambulatory, no cane or walker.    Works 30 hours per week at [employer].   Has VA benefits - goes to [place] once or twice a year for vision, hearing, screenings, vaccines, etc.   Social Determinants of Health   Financial Resource Strain: Low Risk  (10/05/2021)   Overall Financial Resource Strain (CARDIA)    Difficulty of Paying Living Expenses: Not hard at all  Food Insecurity: No Food Insecurity (02/03/2023)   Hunger Vital Sign    Worried About Running Out of Food in the Last Year:  Never true    Ran Out of Food in the Last Year: Never true  Transportation Needs: No Transportation Needs (02/03/2023)   PRAPARE - Hydrologist (Medical): No    Lack of Transportation (Non-Medical): No  Physical Activity: Sufficiently Active (10/05/2021)   Exercise Vital Sign    Days of Exercise per Week: 5 days    Minutes of Exercise per Session: 60 min  Stress: No Stress Concern Present (10/05/2021)   [place]    Feeling of Stress : Not at all  Social Connections: Moderately Isolated (10/05/2021)   Social Connection and Isolation Panel [NHANES]    Frequency of Communication with Friends and Family: Once a week    Frequency of Social Gatherings with Friends and Family: Once a week    Attends Religious Services: Never    Marine scientist or Organizations: Yes    Attends Music therapist: More than 4 times per year    Marital Status: Married  Human resources officer Violence: Not At Risk (10/05/2021)   Humiliation, Afraid, Rape, and Kick questionnaire    Fear of Current or Ex-Partner: No    Emotionally Abused: No    Physically Abused: No    Sexually Abused: No    Allergies  Allergen Reactions   Ciprofloxacin Hives   Definity [Perflutren Lipid Microsphere] Nausea And Vomiting    During ECHO with Definity injection pt became nauseated and vomited    Levofloxacin [Levofloxacin] Hives    Current Outpatient Medications  Medication Sig Dispense Refill   acetaminophen (TYLENOL) 500 MG tablet Take 500 mg by mouth every 6 (six) hours as needed for mild pain.     allopurinol (ZYLOPRIM) 300 MG tablet TAKE 1 TABLET BY MOUTH IN THE  MORNING 100 tablet 0   amLODipine (NORVASC) 10 MG tablet Take 10 mg by mouth in the morning.     amLODipine-olmesartan (AZOR) 10-40 MG tablet TAKE 1 TABLET BY MOUTH DAILY 100 tablet 0   apixaban (ELIQUIS) 5 MG TABS tablet Take 1 tablet (5 mg total) by  mouth 2 (two) times daily. STOP ON 3/24 180 tablet 1   atorvastatin (LIPITOR) 80 MG tablet TAKE 1 TABLET BY MOUTH DAILY AT  6 PM 100 tablet 0   calcitRIOL (ROCALTROL) 0.25 MCG capsule TAKE 1 CAPSULE BY MOUTH DAILY  WITH BREAKFAST 100 capsule 0   calcium carbonate (OSCAL) 1500 (600 Ca) MG TABS tablet Take 600 mg of elemental calcium by mouth 2 (two) times daily with a meal.     carvedilol (COREG) 6.25 MG tablet 1/2  q am & 1 qpm (Patient taking differently: Take 6.25 mg by mouth every evening. Per patient taking 6.25 mg in the morning and 3.125 mg taking in the pm) 135 tablet 3   celecoxib (CELEBREX) 200 MG capsule TAKE 1 CAPSULE BY MOUTH DAILY  WITH FOOD 90 capsule 2   cloNIDine (CATAPRES) 0.2 MG tablet TAKE 1 TABLET BY MOUTH TWICE  DAILY FOR BLOOD PRESSURE (Patient taking differently: Take 0.2 mg by mouth in the morning.) 200 tablet 0   clopidogrel (PLAVIX) 75 MG tablet Take 1 tablet (75 mg total) by mouth daily. START ON 3/25 90 tablet 3   Colchicine 0.6 MG CAPS Take 1 capsule by mouth daily. 90 capsule 2   DULoxetine (CYMBALTA) 30 MG capsule TAKE 1 CAPSULE BY MOUTH TWICE  DAILY 200 capsule 0   ferrous sulfate 325 (65 FE) MG tablet Take 325 mg by mouth in the morning.     fluocinolone (SYNALAR) 0.01 % external solution Place 3-5 drops in each ear canal twice daily (Patient taking differently: 3-5 drops 2 (two) times daily as needed (irritation/infection.). Place 3-5 drops in each ear canal twice daily) 60 mL 5   fluticasone (FLONASE) 50 MCG/ACT nasal spray USE 2 SPRAYS IN BOTH  NOSTRILS DAILY AS NEEDED  FOR ALLERGIES OR RHINITIS 48 g 2   folic acid (FOLVITE) 1 MG tablet Take 1 mg by mouth in the morning.     furosemide (LASIX) 40 MG tablet TAKE 1 TABLET BY MOUTH IN THE  MORNING 100 tablet 0   hydroxychloroquine (PLAQUENIL) 200 MG tablet TAKE 1 TABLET BY MOUTH IN THE  MORNING AND AT BEDTIME 200 tablet 0   Multiple Vitamin (MULTIVITAMIN WITH MINERALS) TABS tablet Take 1 tablet by mouth in the morning.      neomycin-polymyxin-hydrocortisone (CORTISPORIN) 3.5-10000-1 OTIC suspension PLACE 4 DROPS INTO AFFECTED EAR TWICE DAILY FOR 10 DAYS (Patient taking differently: Place 4 drops into both ears 2 (two) times daily as needed (irritation/infection.). PLACE 4 DROPS INTO AFFECTED EAR TWICE DAILY FOR 10 DAYS) 10 mL 3   potassium chloride SA (KLOR-CON M) 20 MEQ tablet TAKE 1 TABLET BY MOUTH DAILY FOR POTASSIUM REPLACEMENT/  SUPPLEMENT 100 tablet 0   sildenafil (REVATIO) 20 MG tablet Take 2-5 pills at once, orally, with each sexual encounter 50 tablet 5   silver sulfADIAZINE (SILVADENE) 1 % cream Apply topically as needed.     tamsulosin (FLOMAX) 0.4 MG CAPS capsule TAKE 1 CAPSULE BY MOUTH AT  BEDTIME 100 capsule 0   tirzepatide (MOUNJARO) 5 MG/0.5ML Pen Inject 5 mg into the skin once a week. 2 mL 0   traZODone (DESYREL) 150 MG tablet Use from 1/3 to 1 tablet nightly as needed for sleep. 90 tablet 1   vitamin B-12 (CYANOCOBALAMIN) 500 MCG tablet Take 500 mcg by mouth in the morning.     No current facility-administered medications for this visit.    REVIEW OF SYSTEMS:  [X]  denotes positive finding, [ ]  denotes negative finding Cardiac  Comments:  Chest pain or chest pressure:    Shortness of breath upon exertion:    Short of breath when lying flat:    Irregular heart rhythm: x       Vascular    Pain in calf, thigh, or hip brought on by ambulation: x   Pain in feet at night that wakes you up from your sleep:  x   Blood clot in your veins: x   Leg swelling:  x  Pulmonary    Oxygen at home:    Productive cough:     Wheezing:         Neurologic    Sudden weakness in arms or legs:  x   Sudden numbness in arms or legs:     Sudden onset of difficulty speaking or slurred speech:    Temporary loss of vision in one eye:     Problems with dizziness:         Gastrointestinal    Blood in stool:     Vomited blood:         Genitourinary    Burning when urinating:     Blood in urine:         Psychiatric    Major depression:         Hematologic    Bleeding problems:    Problems with blood clotting too easily:        Skin    Rashes or ulcers:        Constitutional    Fever or chills:      PHYSICAL EXAM: Vitals:   03/15/23 1237  BP: (!) 163/69  Pulse: (!) 58  Temp: 98.2 F (36.8 C)  Weight: (!) 313 lb 6.4 oz (142.2 kg)  Height: 6\' 1"  (1.854 m)    GENERAL: The patient is a well-nourished male, in no acute distress. The vital signs are documented above. CARDIOVASCULAR: 2+ radial and 2+ dorsalis pedis pulses bilaterally PULMONARY: There is good air exchange  MUSCULOSKELETAL: There are no major deformities or cyanosis.  Healed amputations of his right great second and third toe tips NEUROLOGIC: No focal weakness or paresthesias are detected. SKIN: He does have a small superficial open ulceration on his right lateral distal calf above the malleolus.  He has extensive hemosiderin deposit bilaterally circumferentially from his knees down to his ankle.  He has brawny edema and swelling bilaterally more so on the right than on the left PSYCHIATRIC: The patient has a normal affect.  DATA:  Noninvasive studies today in our office revealed some noncompressibility in the right leg.  He does have normal ankle arm index on the left and normal triphasic waveforms bilaterally  MEDICAL ISSUES: Had long discussion with patient regarding his lower extremity issues.  I do not feel that he has any evidence of lower extremity arterial insufficiency.  He does have some medial calcification in the right leg which is not flow-limiting.  He does have extensive venous hypertension bilaterally most likely related to his prior bilateral DVTs.  He is beginning to develop a venous ulceration on the right lateral calf.  This is quite superficial.  He does wear compression garments.  I explained to him the critical importance of daily use of compression garments.  His explained that is important to  place these when he first arises in the morning and wear it throughout the day.  He has been wearing compression on his right leg only and I have recommended that he have compression on his left leg to with his changes of severe venous hypertension.  Also discussed the importance of elevation when possible.  He does have COPD making elevating his legs higher than his heart difficult.  He was reassured with this discussion and will see [place] again on an as-needed basis   Rosetta Posner, MD [facility] Ve[facility] Specialists of [facility] [phone] Pager [phone]  Note: Portions of this report may have been transcribed using voice recognition  software.  Every effort has been made to ensure accuracy; however, inadvertent computerized transcription errors may still be present.

## 2023-03-17 ENCOUNTER — Encounter: Payer: Self-pay | Admitting: Hematology

## 2023-04-04 ENCOUNTER — Telehealth (HOSPITAL_COMMUNITY): Payer: Self-pay | Admitting: *Deleted

## 2023-04-04 NOTE — Telephone Encounter (Signed)
Reaching out to patient to offer assistance regarding upcoming cardiac imaging study; pt verbalizes understanding of appt date/time, parking situation and where to check in, pre-test NPO status, and verified current allergies; name and call back number provided for further questions should they arise  Amedio Bowlby RN Navigator Cardiac Imaging [facility] [phone] office [phone] cell  Patient aware to arrive at 9:30am. 

## 2023-04-05 ENCOUNTER — Ambulatory Visit (INDEPENDENT_AMBULATORY_CARE_PROVIDER_SITE_OTHER): Payer: Medicare Other | Admitting: Family Medicine

## 2023-04-05 ENCOUNTER — Encounter: Payer: Self-pay | Admitting: Family Medicine

## 2023-04-05 VITALS — BP 135/49 | HR 51 | Temp 97.0°F | Ht 73.0 in | Wt 311.4 lb

## 2023-04-05 DIAGNOSIS — E1122 Type 2 diabetes mellitus with diabetic chronic kidney disease: Secondary | ICD-10-CM | POA: Diagnosis not present

## 2023-04-05 DIAGNOSIS — E1169 Type 2 diabetes mellitus with other specified complication: Secondary | ICD-10-CM | POA: Diagnosis not present

## 2023-04-05 DIAGNOSIS — I10 Essential (primary) hypertension: Secondary | ICD-10-CM

## 2023-04-05 DIAGNOSIS — M1A00X0 Idiopathic chronic gout, unspecified site, without tophus (tophi): Secondary | ICD-10-CM | POA: Diagnosis not present

## 2023-04-05 DIAGNOSIS — E785 Hyperlipidemia, unspecified: Secondary | ICD-10-CM | POA: Diagnosis not present

## 2023-04-05 DIAGNOSIS — N1832 Chronic kidney disease, stage 3b: Secondary | ICD-10-CM

## 2023-04-05 DIAGNOSIS — I129 Hypertensive chronic kidney disease with stage 1 through stage 4 chronic kidney disease, or unspecified chronic kidney disease: Secondary | ICD-10-CM

## 2023-04-05 DIAGNOSIS — E782 Mixed hyperlipidemia: Secondary | ICD-10-CM | POA: Diagnosis not present

## 2023-04-05 DIAGNOSIS — Z23 Encounter for immunization: Secondary | ICD-10-CM

## 2023-04-05 DIAGNOSIS — N400 Enlarged prostate without lower urinary tract symptoms: Secondary | ICD-10-CM

## 2023-04-05 LAB — BAYER DCA HB A1C WAIVED: HB A1C (BAYER DCA - WAIVED): 5.9 % — ABNORMAL HIGH (ref 4.8–5.6)

## 2023-04-05 MED ORDER — ATORVASTATIN CALCIUM 80 MG PO TABS
ORAL_TABLET | ORAL | 2 refills | Status: DC
Start: 2023-04-05 — End: 2023-12-29

## 2023-04-05 MED ORDER — CALCITRIOL 0.25 MCG PO CAPS: 0.2500 ug | ORAL_CAPSULE | Freq: Every day | ORAL | 2 refills | Status: DC

## 2023-04-05 MED ORDER — POTASSIUM CHLORIDE CRYS ER 20 MEQ PO TBCR: EXTENDED_RELEASE_TABLET | ORAL | 2 refills | Status: DC

## 2023-04-05 MED ORDER — ALLOPURINOL 300 MG PO TABS: 300.0000 mg | ORAL_TABLET | Freq: Every morning | ORAL | 2 refills | Status: DC

## 2023-04-05 MED ORDER — AMLODIPINE-OLMESARTAN 10-40 MG PO TABS: 1.0000 | ORAL_TABLET | Freq: Every day | ORAL | 2 refills | Status: DC

## 2023-04-05 MED ORDER — TAMSULOSIN HCL 0.4 MG PO CAPS: 0.4000 mg | ORAL_CAPSULE | Freq: Every day | ORAL | 0 refills | Status: DC

## 2023-04-05 NOTE — Progress Notes (Signed)
Subjective:  Patient ID: Mitchell Jarred.,  male    DOB: 28-Nov-1951  Age: 72 y.o.    CC: Medical Management of Chronic Issues   HPI Mitchell Parker. presents for  follow-up of hypertension. Patient has no history of headache chest pain or shortness of breath or recent cough. Patient also denies symptoms of TIA such as numbness weakness lateralizing. Patient denies side effects from medication. States taking it regularly.  Patient also  in for follow-up of elevated cholesterol. Doing well without complaints on current medication. Denies side effects  including myalgia and arthralgia and nausea. Also in today for liver function testing. Currently no chest pain, shortness of breath or other cardiovascular related symptoms noted.  Follow-up of diabetes. Patient does check blood sugar at home. Readings run between 80 and 90 Patient denies symptoms such as excessive hunger or urinary frequency, excessive hunger, nausea No significant hypoglycemic spells noted. Medications reviewed. Pt reports taking them regularly. Pt. denies complication/adverse reaction today.   Dr. Arbie Cookey told him that his circulation south was better than [place] in his legs. Reommended compression hose and elevation. No swelling currently.    History Mitchell Parker has a past medical history of Chronic diarrhea, CKD (chronic kidney disease) stage 3, GFR 30-59 ml/min, COPD (chronic obstructive pulmonary disease), Dysrhythmia (02/2022), Essential hypertension, Gallstones, Gout, History of CHF (congestive heart failure), History of colonic polyps, History of DVT (deep vein thrombosis) (04/19/2012), Iron deficiency (02/16/2023), Kidney stones, Lumbar disc disease, Neuropathy, Nonischemic cardiomyopathy (2003), Obesity, Orthostatic hypotension, Presence of Watchman left atrial appendage closure device (02/02/2023), RA (rheumatoid arthritis), Sleep apnea, Subarachnoid hemorrhage (02/03/2022), Subdural hematoma (02/03/2022), Syncope,  vasovagal, and Type 2 diabetes mellitus.   He has a past surgical history that includes Umbilical hernia repair; Pilonidal cyst excision (1997); Varicose vein surgery; Cholecystectomy (01/18/2012); Bariatric Surgery (01/11/2018); Eye surgery (Right); [facility] (Right, 05/04/2022); Toe amputation (Right); LEFT ATRIAL APPENDAGE OCCLUSION (N/A, 02/02/2023); TEE without cardioversion (N/A, 02/02/2023); Colonoscopy (05/2017); and watchman implant.   His family history includes Diabetes in his brother and father; Heart disease in his father; Hypertension in his father and mother; Stroke in his mother.He reports that he quit smoking about 28 years ago. His smoking use included cigarettes. He has a 50.00 pack-year smoking history. He has never used smokeless tobacco. He reports that he does not drink alcohol and does not use drugs.  Current Outpatient Medications on File Prior to Visit  Medication Sig Dispense Refill   acetaminophen (TYLENOL) 500 MG tablet Take 500 mg by mouth every 6 (six) hours as needed for mild pain.     amLODipine (NORVASC) 10 MG tablet Take 10 mg by mouth in the morning.     apixaban (ELIQUIS) 5 MG TABS tablet Take 1 tablet (5 mg total) by mouth 2 (two) times daily. STOP ON 3/24 180 tablet 1   calcium carbonate (OSCAL) 1500 (600 Ca) MG TABS tablet Take 600 mg of elemental calcium by mouth 2 (two) times daily with a meal.     carvedilol (COREG) 6.25 MG tablet 1/2 q am & 1 qpm (Patient taking differently: Take 6.25 mg by mouth every evening. Per patient taking 6.25 mg in the morning and 3.125 mg taking in the pm) 135 tablet 3   celecoxib (CELEBREX) 200 MG capsule TAKE 1 CAPSULE BY MOUTH DAILY  WITH FOOD 90 capsule 2   cloNIDine (CATAPRES) 0.2 MG tablet TAKE 1 TABLET BY MOUTH TWICE  DAILY FOR BLOOD PRESSURE (Patient taking differently: Take 0.2 mg  by mouth in the morning.) 200 tablet 0   clopidogrel (PLAVIX) 75 MG tablet Take 1 tablet (75 mg total) by mouth daily. START ON 3/25 90 tablet 3    Colchicine 0.6 MG CAPS Take 1 capsule by mouth daily. 90 capsule 2   DULoxetine (CYMBALTA) 30 MG capsule TAKE 1 CAPSULE BY MOUTH TWICE  DAILY 200 capsule 0   ferrous sulfate 325 (65 FE) MG tablet Take 325 mg by mouth in the morning.     fluocinolone (SYNALAR) 0.01 % external solution Place 3-5 drops in each ear canal twice daily (Patient taking differently: 3-5 drops 2 (two) times daily as needed (irritation/infection.). Place 3-5 drops in each ear canal twice daily) 60 mL 5   fluticasone (FLONASE) 50 MCG/ACT nasal spray USE 2 SPRAYS IN BOTH  NOSTRILS DAILY AS NEEDED  FOR ALLERGIES OR RHINITIS 48 g 2   folic acid (FOLVITE) 1 MG tablet Take 1 mg by mouth in the morning.     furosemide (LASIX) 40 MG tablet TAKE 1 TABLET BY MOUTH IN THE  MORNING 100 tablet 0   hydroxychloroquine (PLAQUENIL) 200 MG tablet TAKE 1 TABLET BY MOUTH IN THE  MORNING AND AT BEDTIME 200 tablet 0   Multiple Vitamin (MULTIVITAMIN WITH MINERALS) TABS tablet Take 1 tablet by mouth in the morning.     neomycin-polymyxin-hydrocortisone (CORTISPORIN) 3.5-10000-1 OTIC suspension PLACE 4 DROPS INTO AFFECTED EAR TWICE DAILY FOR 10 DAYS (Patient taking differently: Place 4 drops into both ears 2 (two) times daily as needed (irritation/infection.). PLACE 4 DROPS INTO AFFECTED EAR TWICE DAILY FOR 10 DAYS) 10 mL 3   sildenafil (REVATIO) 20 MG tablet Take 2-5 pills at once, orally, with each sexual encounter 50 tablet 5   silver sulfADIAZINE (SILVADENE) 1 % cream Apply topically as needed.     tirzepatide [facility]) 5 MG/0.5ML Pen Inject 5 mg into the skin once a week. 2 mL 0   vitamin B-12 (CYANOCOBALAMIN) 500 MCG tablet Take 500 mcg by mouth in the morning.     No current facility-administered medications on file prior to visit.    ROS Review of Systems  Constitutional:  Negative for fever.  Respiratory:  Negative for shortness of breath.   Cardiovascular:  Negative for chest pain.  Musculoskeletal:  Negative for arthralgias.   Skin:  Negative for rash.    Objective:  BP (!) 135/49   Pulse (!) 51   Temp (!) 97 F (36.1 C)   Ht 6\' 1"  (1.854 m)   Wt (!) 311 lb 6.4 oz (141.3 kg)   SpO2 97%   BMI 41.08 kg/m   BP Readings from Last 3 Encounters:  04/05/23 (!) 135/49  03/15/23 (!) 163/69  03/08/23 (!) 106/50    Wt Readings from Last 3 Encounters:  04/05/23 (!) 311 lb 6.4 oz (141.3 kg)  03/15/23 (!) 313 lb 6.4 oz (142.2 kg)  03/08/23 (!) 323 lb 6.4 oz (146.7 kg)     Physical Exam Vitals reviewed.  Constitutional:      Appearance: He is well-developed.  HENT:     Head: Normocephalic and atraumatic.     Right Ear: External ear normal.     Left Ear: External ear normal.     Mouth/Throat:     Pharynx: No oropharyngeal exudate or posterior oropharyngeal erythema.  Eyes:     Pupils: Pupils are equal, round, and reactive to light.  Cardiovascular:     Rate and Rhythm: Normal rate and regular rhythm.  Heart sounds: No murmur heard. Pulmonary:     Effort: No respiratory distress.     Breath sounds: Normal breath sounds.  Musculoskeletal:     Cervical back: Normal range of motion and neck supple.  Neurological:     Mental Status: He is alert and oriented to person, place, and time.     Diabetic Foot Exam - Simple   No data filed     Lab Results  Component Value Date   HGBA1C 5.9 (H) 04/05/2023   HGBA1C 7.4 (H) 01/04/2023   HGBA1C 8.1 (H) 09/28/2022    Assessment & Plan:   Mitchell Parker was seen today for medical management of chronic issues.  Diagnoses and all orders for this visit:  Type 2 diabetes mellitus with stage 3b chronic kidney disease, without long-term current use of insulin -     Bayer DCA Hb A1c Waived  Primary hypertension -     CBC with Differential/Platelet -     CMP14+EGFR  Hyperlipidemia associated with type 2 diabetes mellitus -     Lipid panel  Idiopathic chronic gout without tophus, unspecified site -     allopurinol (ZYLOPRIM) 300 MG tablet; Take 1 tablet  (300 mg total) by mouth every morning.  Mixed hyperlipidemia -     atorvastatin (LIPITOR) 80 MG tablet; TAKE 1 TABLET BY MOUTH DAILY AT  6 PM  Benign prostatic hyperplasia without lower urinary tract symptoms -     tamsulosin (FLOMAX) 0.4 MG CAPS capsule; Take 1 capsule (0.4 mg total) by mouth at bedtime.  Need for pneumococcal 20-valent conjugate vaccination -     Pneumococcal conjugate vaccine 20-valent (Prevnar 20)  Other orders -     amLODipine-olmesartan (AZOR) 10-40 MG tablet; Take 1 tablet by mouth daily. -     calcitRIOL (ROCALTROL) 0.25 MCG capsule; Take 1 capsule (0.25 mcg total) by mouth daily with breakfast. -     potassium chloride SA (KLOR-CON M) 20 MEQ tablet; TAKE 1 TABLET BY MOUTH DAILY FOR POTASSIUM REPLACEMENT/  SUPPLEMENT   I have discontinued [name] "Bill"'s traZODone. I have also changed his allopurinol, calcitRIOL, and tamsulosin. Additionally, I am having him maintain his calcium carbonate, vitamin B-12, acetaminophen, fluticasone, ferrous sulfate, sildenafil, celecoxib, Colchicine, fluocinolone, neomycin-polymyxin-hydrocortisone, carvedilol, furosemide, DULoxetine, cloNIDine, hydroxychloroquine, amLODipine, folic acid, multivitamin with minerals, tirzepatide, silver sulfADIAZINE, clopidogrel, apixaban, amLODipine-olmesartan, atorvastatin, and potassium chloride SA.  Meds ordered this encounter  Medications   allopurinol (ZYLOPRIM) 300 MG tablet    Sig: Take 1 tablet (300 mg total) by mouth every morning.    Dispense:  100 tablet    Refill:  2    Please send a replace/new response with 100-Day Supply if appropriate to maximize member benefit.   amLODipine-olmesartan (AZOR) 10-40 MG tablet    Sig: Take 1 tablet by mouth daily.    Dispense:  100 tablet    Refill:  2    Please send a replace/new response with 100-Day Supply if appropriate to maximize member benefit.   atorvastatin (LIPITOR) 80 MG tablet    Sig: TAKE 1 TABLET BY MOUTH DAILY AT  6 PM     Dispense:  100 tablet    Refill:  2    Please send a replace/new response with 100-Day Supply if appropriate to maximize member benefit.   calcitRIOL (ROCALTROL) 0.25 MCG capsule    Sig: Take 1 capsule (0.25 mcg total) by mouth daily with breakfast.    Dispense:  100 capsule    Refill:  2    Please send a replace/new response with 100-Day Supply if appropriate to maximize member benefit.   potassium chloride SA (KLOR-CON M) 20 MEQ tablet    Sig: TAKE 1 TABLET BY MOUTH DAILY FOR POTASSIUM REPLACEMENT/  SUPPLEMENT    Dispense:  100 tablet    Refill:  2    Please send a replace/new response with 100-Day Supply if appropriate to maximize member benefit.   tamsulosin (FLOMAX) 0.4 MG CAPS capsule    Sig: Take 1 capsule (0.4 mg total) by mouth at bedtime.    Dispense:  100 capsule    Refill:  0    Please send a replace/new response with 100-Day Supply if appropriate to maximize member benefit.     Follow-up: Return in about 3 months (around 07/05/2023).  Mechele ClaudeWarren Param Capri, M.D.

## 2023-04-06 ENCOUNTER — Ambulatory Visit (HOSPITAL_COMMUNITY)
Admission: RE | Admit: 2023-04-06 | Discharge: 2023-04-06 | Disposition: A | Discharge status: Home / Self Care | Payer: No Typology Code available for payment source | Source: Ambulatory Visit | Attending: Cardiovascular Disease | Admitting: Cardiovascular Disease

## 2023-04-06 DIAGNOSIS — Z95818 Presence of other cardiac implants and grafts: Secondary | ICD-10-CM | POA: Diagnosis present

## 2023-04-06 DIAGNOSIS — I4892 Unspecified atrial flutter: Secondary | ICD-10-CM | POA: Insufficient documentation

## 2023-04-06 LAB — CBC WITH DIFFERENTIAL/PLATELET
Basophils Absolute: 0 10*3/uL (ref 0.0–0.2)
Basos: 1 %
EOS (ABSOLUTE): 0.3 10*3/uL (ref 0.0–0.4)
Eos: 7 %
Hematocrit: 38.2 % (ref 37.5–51.0)
Hemoglobin: 13 g/dL (ref 13.0–17.7)
Immature Grans (Abs): 0 10*3/uL (ref 0.0–0.1)
Immature Granulocytes: 0 %
Lymphocytes Absolute: 0.9 10*3/uL (ref 0.7–3.1)
Lymphs: 20 %
MCH: 32.7 pg (ref 26.6–33.0)
MCHC: 34 g/dL (ref 31.5–35.7)
MCV: 96 fL (ref 79–97)
Monocytes Absolute: 0.4 10*3/uL (ref 0.1–0.9)
Monocytes: 10 %
Neutrophils Absolute: 2.7 10*3/uL (ref 1.4–7.0)
Neutrophils: 62 %
Platelets: 113 10*3/uL — ABNORMAL LOW (ref 150–450)
RBC: 3.98 x10E6/uL — ABNORMAL LOW (ref 4.14–5.80)
RDW: 13.9 % (ref 11.6–15.4)
WBC: 4.4 10*3/uL (ref 3.4–10.8)

## 2023-04-06 LAB — CMP14+EGFR
ALT: 34 IU/L (ref 0–44)
AST: 29 IU/L (ref 0–40)
Albumin/Globulin Ratio: 1.8 (ref 1.2–2.2)
Albumin: 3.9 g/dL (ref 3.9–4.9)
Alkaline Phosphatase: 53 IU/L (ref 44–121)
BUN/Creatinine Ratio: 20 (ref 10–24)
BUN: 25 mg/dL (ref 8–27)
Bilirubin Total: 0.6 mg/dL (ref 0.0–1.2)
CO2: 24 mmol/L (ref 20–29)
Calcium: 8.8 mg/dL (ref 8.6–10.2)
Chloride: 105 mmol/L (ref 96–106)
Creatinine, Ser: 1.22 mg/dL (ref 0.76–1.27)
Globulin, Total: 2.2 g/dL (ref 1.5–4.5)
Glucose: 125 mg/dL — ABNORMAL HIGH (ref 70–99)
Potassium: 4.6 mmol/L (ref 3.5–5.2)
Sodium: 142 mmol/L (ref 134–144)
Total Protein: 6.1 g/dL (ref 6.0–8.5)
eGFR: 64 mL/min/{1.73_m2} (ref >59)

## 2023-04-06 LAB — LIPID PANEL
Chol/HDL Ratio: 2.8 ratio (ref 0.0–5.0)
Cholesterol, Total: 91 mg/dL — ABNORMAL LOW (ref 100–199)
HDL: 33 mg/dL — ABNORMAL LOW (ref >39)
LDL Chol Calc (NIH): 41 mg/dL (ref 0–99)
Triglycerides: 80 mg/dL (ref 0–149)
VLDL Cholesterol Cal: 17 mg/dL (ref 5–40)

## 2023-04-06 MED ORDER — IOHEXOL 350 MG/ML SOLN
100.0000 mL | Freq: Once | INTRAVENOUS | Status: AC | PRN
  Administered 2023-04-06: 100 mL via INTRAVENOUS

## 2023-04-06 NOTE — Progress Notes (Signed)
Hello [place], ° °Your lab result is normal and/or stable.Some minor variations that are not significant are commonly marked abnormal, but do not represent any medical problem for you. ° °Best regards, °Jora Galluzzo, M.D.

## 2023-04-09 ENCOUNTER — Other Ambulatory Visit: Payer: Self-pay | Admitting: Family Medicine

## 2023-04-10 ENCOUNTER — Telehealth: Payer: Self-pay

## 2023-04-10 NOTE — Telephone Encounter (Signed)
Reviewed results with patient who verbalized understanding.   Confirmed he STOPPED ELIQUIS and STARTED PLAVIX as directed. Med list updated. Will call patient at 6 month mark to STOP PLAVIX. He was grateful for call and agreed with plan

## 2023-04-10 NOTE — Telephone Encounter (Signed)
-----   Message from Tonny Bollman, MD sent at 04/09/2023  5:48 PM EDT ----- Complete seal of Watchman Flx device. Continue clopidogrel through 6 months as outlined.

## 2023-04-14 ENCOUNTER — Other Ambulatory Visit: Payer: Self-pay | Admitting: Family Medicine

## 2023-04-14 NOTE — Telephone Encounter (Signed)
LMTRC

## 2023-05-01 ENCOUNTER — Telehealth: Payer: Self-pay | Admitting: Family Medicine

## 2023-05-01 ENCOUNTER — Other Ambulatory Visit: Payer: Self-pay | Admitting: Family Medicine

## 2023-05-01 MED ORDER — TIRZEPATIDE 2.5 MG/0.5ML ~~LOC~~ SOAJ: 2.5000 mg | SUBCUTANEOUS | 0 refills | Status: DC

## 2023-05-01 NOTE — Telephone Encounter (Signed)
Pt returned missed call. Reviewed Dr Darlyn Read note with pt. Pt voiced understanding. Wants to know if [facility] approves or not.

## 2023-05-01 NOTE — Telephone Encounter (Signed)
His A1c was very good last month. Also, some of the pharmacies have the lower dose, 2.5, so let's try that first. I sent in a scrip for it to [facility].

## 2023-05-01 NOTE — Telephone Encounter (Signed)
CALLED PATIENT, NO ANSWER, LEFT MESSAGE TO RETURN CALL 

## 2023-05-04 NOTE — Telephone Encounter (Signed)
I have no options to suggst. May want to set up office fllow up if glucose climbs too high before they get the medicine.

## 2023-05-04 NOTE — Telephone Encounter (Signed)
Patient said [facility] told him they are unable to get any of the doses at this time and he does not want to go to any other pharmacy because it is going to be too expensive. Would like to know what his other options are. Please call back.

## 2023-05-04 NOTE — Telephone Encounter (Signed)
Called patient, no answer, left message to return call 

## 2023-05-05 NOTE — Telephone Encounter (Signed)
Pt r/c.

## 2023-05-08 NOTE — Telephone Encounter (Signed)
Patient aware and agreeable. 

## 2023-05-11 LAB — HM DIABETES EYE EXAM

## 2023-05-11 NOTE — Telephone Encounter (Signed)
Mailed letter °

## 2023-05-16 ENCOUNTER — Other Ambulatory Visit: Payer: Self-pay | Admitting: Family Medicine

## 2023-05-25 ENCOUNTER — Other Ambulatory Visit (HOSPITAL_COMMUNITY): Payer: Self-pay | Admitting: Internal Medicine

## 2023-05-25 DIAGNOSIS — N289 Disorder of kidney and ureter, unspecified: Secondary | ICD-10-CM

## 2023-05-28 ENCOUNTER — Other Ambulatory Visit: Payer: Self-pay | Admitting: Family Medicine

## 2023-05-28 DIAGNOSIS — I10 Essential (primary) hypertension: Secondary | ICD-10-CM

## 2023-05-28 DIAGNOSIS — I509 Heart failure, unspecified: Secondary | ICD-10-CM

## 2023-05-28 DIAGNOSIS — N1832 Chronic kidney disease, stage 3b: Secondary | ICD-10-CM

## 2023-06-06 ENCOUNTER — Encounter (HOSPITAL_COMMUNITY): Payer: Self-pay

## 2023-06-06 ENCOUNTER — Ambulatory Visit (HOSPITAL_COMMUNITY): Payer: No Typology Code available for payment source

## 2023-06-07 ENCOUNTER — Ambulatory Visit: Payer: Medicare Other | Admitting: Cardiology

## 2023-06-19 ENCOUNTER — Ambulatory Visit (HOSPITAL_COMMUNITY)
Admission: RE | Admit: 2023-06-19 | Discharge: 2023-06-19 | Disposition: A | Discharge status: Home / Self Care | Payer: No Typology Code available for payment source | Source: Ambulatory Visit | Attending: Internal Medicine | Admitting: Internal Medicine

## 2023-06-19 DIAGNOSIS — N289 Disorder of kidney and ureter, unspecified: Secondary | ICD-10-CM | POA: Diagnosis present

## 2023-06-19 DIAGNOSIS — R93421 Abnormal radiologic findings on diagnostic imaging of right kidney: Secondary | ICD-10-CM | POA: Diagnosis not present

## 2023-06-22 ENCOUNTER — Other Ambulatory Visit: Payer: Self-pay | Admitting: Family Medicine

## 2023-06-26 ENCOUNTER — Other Ambulatory Visit: Payer: Self-pay | Admitting: Family Medicine

## 2023-07-05 ENCOUNTER — Encounter: Payer: Self-pay | Admitting: Family Medicine

## 2023-07-05 ENCOUNTER — Ambulatory Visit (INDEPENDENT_AMBULATORY_CARE_PROVIDER_SITE_OTHER): Payer: Medicare Other | Admitting: Family Medicine

## 2023-07-05 VITALS — BP 114/55 | HR 68 | Temp 97.5°F | Ht 73.0 in | Wt 330.0 lb

## 2023-07-05 DIAGNOSIS — I129 Hypertensive chronic kidney disease with stage 1 through stage 4 chronic kidney disease, or unspecified chronic kidney disease: Secondary | ICD-10-CM | POA: Diagnosis not present

## 2023-07-05 DIAGNOSIS — E1169 Type 2 diabetes mellitus with other specified complication: Secondary | ICD-10-CM | POA: Diagnosis not present

## 2023-07-05 DIAGNOSIS — E1122 Type 2 diabetes mellitus with diabetic chronic kidney disease: Secondary | ICD-10-CM

## 2023-07-05 DIAGNOSIS — E785 Hyperlipidemia, unspecified: Secondary | ICD-10-CM | POA: Diagnosis not present

## 2023-07-05 DIAGNOSIS — Z7985 Long-term (current) use of injectable non-insulin antidiabetic drugs: Secondary | ICD-10-CM

## 2023-07-05 DIAGNOSIS — I872 Venous insufficiency (chronic) (peripheral): Secondary | ICD-10-CM

## 2023-07-05 DIAGNOSIS — I10 Essential (primary) hypertension: Secondary | ICD-10-CM | POA: Diagnosis not present

## 2023-07-05 DIAGNOSIS — N1832 Chronic kidney disease, stage 3b: Secondary | ICD-10-CM | POA: Diagnosis not present

## 2023-07-05 LAB — BAYER DCA HB A1C WAIVED: HB A1C (BAYER DCA - WAIVED): 5.8 % — ABNORMAL HIGH (ref 4.8–5.6)

## 2023-07-05 NOTE — Progress Notes (Signed)
Subjective:  Patient ID: Zenovia Jarred.,  male    DOB: [birth year]  Age: 72 y.o.    CC: Medical Management of Chronic Issues   HPI Aryan Sparks. presents for  follow-up of hypertension. Patient has no history of headache chest pain or shortness of breath or recent cough. Patient also denies symptoms of TIA such as numbness weakness lateralizing. Patient denies side effects from medication. States taking it regularly.  Patient also  in for follow-up of elevated cholesterol. Doing well without complaints on current medication. Denies side effects  including myalgia and arthralgia and nausea. Also in today for liver function testing. Currently no chest pain, shortness of breath or other cardiovascular related symptoms noted.  Follow-up of diabetes. Patient does check blood sugar at home. Readings run between 95 and 130 Patient denies symptoms such as excessive hunger or urinary frequency, excessive hunger, nausea No significant hypoglycemic spells noted. Medications reviewed. Pt reports taking them regularly.Couldn't get Mounjaro until 3 weeks ago.  Pt. denies complication/adverse reaction today.    History Jorgen has a past medical history of Chronic diarrhea, CKD (chronic kidney disease) stage 3, GFR 30-59 ml/min (HCC), COPD (chronic obstructive pulmonary disease) (HCC), Dysrhythmia (02/2022), Essential hypertension, Gallstones, Gout, History of CHF (congestive heart failure), History of colonic polyps, History of DVT (deep vein thrombosis) (04/19/2012), Iron deficiency (02/16/2023), Kidney stones, Lumbar disc disease, Neuropathy, Nonischemic cardiomyopathy (HCC) (2003), Obesity, Orthostatic hypotension, Presence of Watchman left atrial appendage closure device (02/02/2023), RA (rheumatoid arthritis) (HCC), Sleep apnea, Subarachnoid hemorrhage (HCC) (02/03/2022), Subdural hematoma (HCC) (02/03/2022), Syncope, vasovagal, and Type 2 diabetes mellitus (HCC).   He has a past surgical history  that includes Umbilical hernia repair; Pilonidal cyst excision (1997); Varicose vein surgery; Cholecystectomy (01/18/2012); Bariatric Surgery (01/11/2018); Eye surgery (Right); [facility] (Right, 05/04/2022); Toe amputation (Right); LEFT ATRIAL APPENDAGE OCCLUSION (N/A, 02/02/2023); TEE without cardioversion (N/A, 02/02/2023); Colonoscopy (05/2017); and watchman implant.   His family history includes Diabetes in his brother and father; Heart disease in his father; Hypertension in his father and mother; Stroke in his mother.He reports that he quit smoking about 28 years ago. His smoking use included cigarettes. He has a 50.00 pack-year smoking history. He has never used smokeless tobacco. He reports that he does not drink alcohol and does not use drugs.  Current Outpatient Medications on File Prior to Visit  Medication Sig Dispense Refill   acetaminophen (TYLENOL) 500 MG tablet Take 500 mg by mouth every 6 (six) hours as needed for mild pain.     allopurinol (ZYLOPRIM) 300 MG tablet Take 1 tablet (300 mg total) by mouth every morning. 100 tablet 2   amLODipine (NORVASC) 10 MG tablet Take 10 mg by mouth in the morning.     amLODipine-olmesartan (AZOR) 10-40 MG tablet Take 1 tablet by mouth daily. 100 tablet 2   atorvastatin (LIPITOR) 80 MG tablet TAKE 1 TABLET BY MOUTH DAILY AT  6 PM 100 tablet 2   calcitRIOL (ROCALTROL) 0.25 MCG capsule Take 1 capsule (0.25 mcg total) by mouth daily with breakfast. 100 capsule 2   calcium carbonate (OSCAL) 1500 (600 Ca) MG TABS tablet Take 600 mg of elemental calcium by mouth 2 (two) times daily with a meal.     carvedilol (COREG) 6.25 MG tablet TAKE ONE-HALF TABLET BY MOUTH  EVERY MORNING AND 1 TABLET BY  MOUTH EVERY EVENING 150 tablet 0   celecoxib (CELEBREX) 200 MG capsule TAKE 1 CAPSULE BY MOUTH DAILY  WITH FOOD 100 capsule  0   cloNIDine (CATAPRES) 0.2 MG tablet TAKE 1 TABLET BY MOUTH TWICE  DAILY FOR BLOOD PRESSURE 200 tablet 0   clopidogrel (PLAVIX) 75 MG tablet  Take 1 tablet (75 mg total) by mouth daily. START ON 3/25 90 tablet 3   Colchicine 0.6 MG CAPS Take 1 capsule by mouth daily. 90 capsule 2   DULoxetine (CYMBALTA) 30 MG capsule TAKE 1 CAPSULE BY MOUTH TWICE  DAILY 200 capsule 0   ferrous sulfate 325 (65 FE) MG tablet Take 325 mg by mouth in the morning.     fluocinolone (SYNALAR) 0.01 % external solution Place 3-5 drops in each ear canal twice daily (Patient taking differently: 3-5 drops 2 (two) times daily as needed (irritation/infection.). Place 3-5 drops in each ear canal twice daily) 60 mL 5   fluticasone (FLONASE) 50 MCG/ACT nasal spray USE 2 SPRAYS IN BOTH  NOSTRILS DAILY AS NEEDED  FOR ALLERGIES OR RHINITIS 48 g 2   folic acid (FOLVITE) 1 MG tablet Take 1 mg by mouth in the morning.     furosemide (LASIX) 40 MG tablet TAKE 1 TABLET BY MOUTH IN THE  MORNING 100 tablet 0   hydroxychloroquine (PLAQUENIL) 200 MG tablet TAKE 1 TABLET BY MOUTH IN THE  MORNING AND AT BEDTIME 200 tablet 0   MOUNJARO 5 MG/0.5ML Pen INJECT THE CONTENTS OF ONE PEN  SUBCUTANEOUSLY WEEKLY AS  DIRECTED 6 mL 3   Multiple Vitamin (MULTIVITAMIN WITH MINERALS) TABS tablet Take 1 tablet by mouth in the morning.     neomycin-polymyxin-hydrocortisone (CORTISPORIN) 3.5-10000-1 OTIC suspension PLACE 4 DROPS INTO AFFECTED EAR TWICE DAILY FOR 10 DAYS (Patient taking differently: Place 4 drops into both ears 2 (two) times daily as needed (irritation/infection.). PLACE 4 DROPS INTO AFFECTED EAR TWICE DAILY FOR 10 DAYS) 10 mL 3   potassium chloride SA (KLOR-CON M) 20 MEQ tablet TAKE 1 TABLET BY MOUTH DAILY FOR POTASSIUM REPLACEMENT/  SUPPLEMENT 100 tablet 2   sildenafil (REVATIO) 20 MG tablet Take 2-5 pills at once, orally, with each sexual encounter 50 tablet 5   silver sulfADIAZINE (SILVADENE) 1 % cream Apply topically as needed.     tamsulosin (FLOMAX) 0.4 MG CAPS capsule Take 1 capsule (0.4 mg total) by mouth at bedtime. 100 capsule 0   tirzepatide (MOUNJARO) 2.5 MG/0.5ML Pen Inject  2.5 mg into the skin once a week. 6 mL 0   vitamin B-12 (CYANOCOBALAMIN) 500 MCG tablet Take 500 mcg by mouth in the morning.     No current facility-administered medications on file prior to visit.    ROS Review of Systems  Constitutional:  Negative for fever.  Respiratory:  Negative for shortness of breath.   Cardiovascular:  Positive for leg swelling (severe swelling from PVD. [facility]. His old one broke down. Weight up today at least partiall due to edema). Negative for chest pain.  Musculoskeletal:  Negative for arthralgias.  Skin:  Negative for rash.    Objective:  BP (!) 114/55   Pulse 68   Temp (!) 97.5 F (36.4 C)   Ht 6\' 1"  (1.854 m)   Wt (!) 330 lb (149.7 kg)   SpO2 95%   BMI 43.54 kg/m   BP Readings from Last 3 Encounters:  07/05/23 (!) 114/55  04/05/23 (!) 135/49  03/15/23 (!) 163/69    Wt Readings from Last 3 Encounters:  07/05/23 (!) 330 lb (149.7 kg)  04/05/23 (!) 311 lb 6.4 oz (141.3 kg)  03/15/23 (!) 313 lb 6.4 oz (142.2 kg)     Physical Exam Vitals reviewed.  Constitutional:      Appearance: He is well-developed. He is obese.  HENT:     Head: Normocephalic and atraumatic.     Right Ear: External ear normal.     Left Ear: External ear normal.     Mouth/Throat:     Pharynx: No oropharyngeal exudate or posterior oropharyngeal erythema.  Eyes:     Pupils: Pupils are equal, round, and reactive to light.  Cardiovascular:     Rate and Rhythm: Normal rate and regular rhythm.     Heart sounds: No murmur heard. Pulmonary:     Effort: No respiratory distress.     Breath sounds: Normal breath sounds.  Musculoskeletal:     Cervical back: Normal range of motion and neck supple.     Right lower leg: Edema (3+) present.     Left lower leg: Edema (3+) present.  Skin:    General: Skin is warm and dry.     Comments: Shins discolored from venous stasis. Dark reddish brown.    Neurological:     Mental Status:  He is alert and oriented to person, place, and time.     Diabetic Foot Exam - Simple   No data filed     Lab Results  Component Value Date   HGBA1C 5.9 (H) 04/05/2023   HGBA1C 7.4 (H) 01/04/2023   HGBA1C 8.1 (H) 09/28/2022    Assessment & Plan:   Orlin was seen today for medical management of chronic issues.  Diagnoses and all orders for this visit:  Type 2 diabetes mellitus with stage 3b chronic kidney disease, without long-term current use of insulin (HCC) -     Bayer DCA Hb A1c Waived  Primary hypertension -     CBC with Differential/Platelet -     CMP14+EGFR  Hyperlipidemia associated with type 2 diabetes mellitus (HCC) -     Lipid panel  Venous insufficiency of both lower extremities  - sequential compression device on order through [place]   I am having [name] "Bill" maintain his calcium carbonate, vitamin B-12, acetaminophen, fluticasone, ferrous sulfate, sildenafil, Colchicine, fluocinolone, neomycin-polymyxin-hydrocortisone, DULoxetine, amLODipine, folic acid, multivitamin with minerals, silver sulfADIAZINE, clopidogrel, allopurinol, amLODipine-olmesartan, atorvastatin, calcitRIOL, potassium chloride SA, tamsulosin, tirzepatide, cloNIDine, hydroxychloroquine, furosemide, carvedilol, Mounjaro, and celecoxib.   Follow-up: Return in about 3 months (around 10/05/2023).  [name], M.D.

## 2023-07-06 LAB — CBC WITH DIFFERENTIAL/PLATELET
Basophils Absolute: 0 10*3/uL (ref 0.0–0.2)
Basos: 1 %
EOS (ABSOLUTE): 0.3 10*3/uL (ref 0.0–0.4)
Eos: 7 %
Hematocrit: 40.5 % (ref 37.5–51.0)
Hemoglobin: 13.4 g/dL (ref 13.0–17.7)
Immature Grans (Abs): 0 10*3/uL (ref 0.0–0.1)
Immature Granulocytes: 0 %
Lymphocytes Absolute: 0.6 10*3/uL — ABNORMAL LOW (ref 0.7–3.1)
Lymphs: 15 %
MCH: 32.4 pg (ref 26.6–33.0)
MCHC: 33.1 g/dL (ref 31.5–35.7)
MCV: 98 fL — ABNORMAL HIGH (ref 79–97)
Monocytes Absolute: 0.3 10*3/uL (ref 0.1–0.9)
Monocytes: 9 %
Neutrophils Absolute: 2.7 10*3/uL (ref 1.4–7.0)
Neutrophils: 68 %
Platelets: 114 10*3/uL — ABNORMAL LOW (ref 150–450)
RBC: 4.14 x10E6/uL (ref 4.14–5.80)
RDW: 12.8 % (ref 11.6–15.4)
WBC: 3.9 10*3/uL (ref 3.4–10.8)

## 2023-07-06 LAB — LIPID PANEL
Chol/HDL Ratio: 3 ratio (ref 0.0–5.0)
Cholesterol, Total: 95 mg/dL — ABNORMAL LOW (ref 100–199)
HDL: 32 mg/dL — ABNORMAL LOW (ref >39)
LDL Chol Calc (NIH): 40 mg/dL (ref 0–99)
Triglycerides: 131 mg/dL (ref 0–149)
VLDL Cholesterol Cal: 23 mg/dL (ref 5–40)

## 2023-07-06 LAB — CMP14+EGFR
ALT: 38 IU/L (ref 0–44)
AST: 36 IU/L (ref 0–40)
Albumin: 3.9 g/dL (ref 3.9–4.9)
Alkaline Phosphatase: 51 IU/L (ref 44–121)
BUN/Creatinine Ratio: 25 — ABNORMAL HIGH (ref 10–24)
BUN: 37 mg/dL — ABNORMAL HIGH (ref 8–27)
Bilirubin Total: 0.4 mg/dL (ref 0.0–1.2)
CO2: 23 mmol/L (ref 20–29)
Calcium: 8.9 mg/dL (ref 8.6–10.2)
Chloride: 105 mmol/L (ref 96–106)
Creatinine, Ser: 1.47 mg/dL — ABNORMAL HIGH (ref 0.76–1.27)
Globulin, Total: 2.1 g/dL (ref 1.5–4.5)
Glucose: 143 mg/dL — ABNORMAL HIGH (ref 70–99)
Potassium: 4.7 mmol/L (ref 3.5–5.2)
Sodium: 142 mmol/L (ref 134–144)
Total Protein: 6 g/dL (ref 6.0–8.5)
eGFR: 51 mL/min/{1.73_m2} — ABNORMAL LOW (ref >59)

## 2023-07-06 NOTE — Progress Notes (Signed)
Hello Raedyn, ° °Your lab result is normal and/or stable.Some minor variations that are not significant are commonly marked abnormal, but do not represent any medical problem for you. ° °Best regards, °Miyo Aina, M.D.

## 2023-07-25 ENCOUNTER — Other Ambulatory Visit: Payer: Self-pay | Admitting: Family Medicine

## 2023-07-31 ENCOUNTER — Encounter: Payer: Self-pay | Admitting: Hematology

## 2023-08-01 ENCOUNTER — Encounter: Payer: Self-pay | Admitting: Cardiology

## 2023-08-01 ENCOUNTER — Telehealth: Payer: Self-pay | Admitting: Family Medicine

## 2023-08-01 ENCOUNTER — Ambulatory Visit: Payer: No Typology Code available for payment source | Attending: Cardiology | Admitting: Cardiology

## 2023-08-01 ENCOUNTER — Other Ambulatory Visit: Payer: Self-pay | Admitting: Family Medicine

## 2023-08-01 VITALS — BP 122/60 | HR 59 | Ht 73.0 in | Wt 312.0 lb

## 2023-08-01 DIAGNOSIS — R931 Abnormal findings on diagnostic imaging of heart and coronary circulation: Secondary | ICD-10-CM

## 2023-08-01 DIAGNOSIS — Z8679 Personal history of other diseases of the circulatory system: Secondary | ICD-10-CM

## 2023-08-01 DIAGNOSIS — I10 Essential (primary) hypertension: Secondary | ICD-10-CM | POA: Diagnosis not present

## 2023-08-01 DIAGNOSIS — I483 Typical atrial flutter: Secondary | ICD-10-CM | POA: Diagnosis not present

## 2023-08-01 MED ORDER — GLIMEPIRIDE 2 MG PO TABS: 2.0000 mg | ORAL_TABLET | Freq: Every day | ORAL | 1 refills | Status: DC

## 2023-08-01 MED ORDER — PIOGLITAZONE HCL 30 MG PO TABS
30.0000 mg | ORAL_TABLET | Freq: Every day | ORAL | 2 refills | Status: DC
Start: 2023-08-01 — End: 2023-08-29

## 2023-08-01 NOTE — Telephone Encounter (Signed)
I sent two meds to [facility] that should be much cheaper. If they work out okay I can send to mail order if he wants me to.

## 2023-08-01 NOTE — Telephone Encounter (Signed)
Patient aware.

## 2023-08-01 NOTE — Telephone Encounter (Signed)
Pt says he is being told that his Mitchell Parker is going to cost him over $800. May require PA. Please advise and call pt with update.

## 2023-08-01 NOTE — Patient Instructions (Signed)
Medication Instructions:   STOP Plavix on 8 8/24  Labwork: None today  Testing/Procedures: None today  Follow-Up: 6 months  Any Other Special Instructions Will Be Listed Below (If Applicable).  If you need a refill on your cardiac medications before your next appointment, please call your pharmacy.

## 2023-08-01 NOTE — Progress Notes (Signed)
Cardiology Office Note  Date: 08/01/2023   ID: Mitchell Gobble., DOB [birth year], MRN [id number]  History of Present Illness: Mitchell Marsiglia. is a 72 y.o. male last seen in March by Ms. Julien Girt NP, I reviewed the note.  He is here for a routine visit.  Reports no exertional chest pain, no palpitations or increasing shortness of breath with typical activities.  He did retire from [employer]  States that he has been active around the house, also working in his shop.  He underwent placement of Watchman device in February by Dr. Excell Seltzer.  Transition from Eliquis to Plavix in February with plan to continue Plavix through August 8th.  I reviewed his medications which are stable from a cardiac perspective.  He is trying to lose some weight, now on Mounjaro as well.  Cardiac regimen includes Azor, Lipitor, Coreg, Lasix, and potassium supplement.  Recent LDL 40 in July.  Physical Exam: VS:  BP 122/60   Pulse (!) 59   Ht 6\' 1"  (1.854 m)   Wt (!) 312 lb (141.5 kg)   SpO2 98%   BMI 41.16 kg/m , BMI Body mass index is 41.16 kg/m.  Wt Readings from Last 3 Encounters:  08/01/23 (!) 312 lb (141.5 kg)  07/05/23 (!) 330 lb (149.7 kg)  04/05/23 (!) 311 lb 6.4 oz (141.3 kg)    General: Patient appears comfortable at rest. HEENT: Conjunctiva and lids normal. Neck: Supple, no elevated JVP or carotid bruits. Lungs: Clear to auscultation, nonlabored breathing at rest. Cardiac: Regular rate and rhythm, no S3 or significant systolic murmur. Extremities: No pitting edema.  ECG:  An ECG dated 02/02/2023 was personally reviewed today and demonstrated:  Sinus bradycardia with prolonged PR interval and IVCD.  Labwork: 11/16/2022: TSH 3.650 07/05/2023: ALT 38; AST 36; BUN 37; Creatinine, Ser 1.47; Hemoglobin 13.4; Platelets 114; Potassium 4.7; Sodium 142     Component Value Date/Time   CHOL 95 (L) 07/05/2023 1007   TRIG 131 07/05/2023 1007   HDL 32 (L) 07/05/2023 1007   CHOLHDL 3.0 07/05/2023 1007    CHOLHDL 11.7 10/22/2011 1030   VLDL 39 10/22/2011 1030   LDLCALC 40 07/05/2023 1007   Other Studies Reviewed Today:  Echocardiogram 03/08/2022:  1. Left ventricular ejection fraction, by estimation, is 60 to 65%. The  left ventricle has normal function. Left ventricular endocardial border  not optimally defined to evaluate regional wall motion. There is mild left  ventricular hypertrophy. Left  ventricular diastolic parameters were normal.   2. Right ventricular systolic function is normal. The right ventricular  size is normal. Tricuspid regurgitation signal is inadequate for assessing  PA pressure.   3. The mitral valve is normal in structure. No evidence of mitral valve  regurgitation. No evidence of mitral stenosis.   4. The aortic valve was not well visualized. Aortic valve regurgitation  is not visualized. No aortic stenosis is present.   5. Aortic dilatation noted. There is mild dilatation of the ascending  aorta, measuring 38 mm.   6. The inferior vena cava is normal in size with greater than 50%  respiratory variability, suggesting right atrial pressure of 3 mmHg.   7. Possible small PFO with mild left to right shunt.   Assessment and Plan:  1.  Paroxysmal atrial flutter with CHA2DS2-VASc score of 5.  He is status post Watchman implantation in February (prior history of traumatic subdural hematomas and subarachnoid hemorrhage while not anticoagulated).  He will be 6 months out  on August 8, plan to stop Plavix at that time as per review of prior notes.  2.  History of nonischemic cardiomyopathy with normalization of LVEF, 60 to 65% by echocardiogram in March of last year.  He is symptomatically stable at this time.  Continue Coreg, Azor, Lasix, and Mounjaro.  3.  Essential hypertension.  Also on clonidine.  Blood pressure is normal today.  No changes were made.  4.  Coronary calcium score 4417 with multivessel coronary atherosclerosis by cardiac CT in January.  Previous  cardiac catheterization in 2003 showed minor coronary atherosclerosis.  Does not report any angina or increasing functional limitations.  Continue medical therapy, he is on Lipitor with recent LDL 40.  Disposition:  Follow up  6 months.  Signed, Jonelle Sidle, M.D., F.A.C.C. [facility]

## 2023-08-01 NOTE — Telephone Encounter (Signed)
Patient is going in donut hole, mounjaro will cost too much next month, plz advise

## 2023-08-09 ENCOUNTER — Emergency Department (HOSPITAL_COMMUNITY)
Admission: EM | Admit: 2023-08-09 | Discharge: 2023-08-09 | Disposition: A | Discharge status: Home / Self Care | Payer: No Typology Code available for payment source | Attending: Emergency Medicine | Admitting: Emergency Medicine

## 2023-08-09 ENCOUNTER — Other Ambulatory Visit: Payer: Self-pay

## 2023-08-09 ENCOUNTER — Emergency Department (HOSPITAL_COMMUNITY): Payer: No Typology Code available for payment source

## 2023-08-09 ENCOUNTER — Encounter (HOSPITAL_COMMUNITY): Payer: Self-pay

## 2023-08-09 DIAGNOSIS — L03113 Cellulitis of right upper limb: Secondary | ICD-10-CM

## 2023-08-09 DIAGNOSIS — M7989 Other specified soft tissue disorders: Secondary | ICD-10-CM | POA: Diagnosis present

## 2023-08-09 DIAGNOSIS — I5032 Chronic diastolic (congestive) heart failure: Secondary | ICD-10-CM | POA: Diagnosis not present

## 2023-08-09 DIAGNOSIS — N183 Chronic kidney disease, stage 3 unspecified: Secondary | ICD-10-CM | POA: Diagnosis not present

## 2023-08-09 DIAGNOSIS — Z79899 Other long term (current) drug therapy: Secondary | ICD-10-CM | POA: Diagnosis not present

## 2023-08-09 DIAGNOSIS — I130 Hypertensive heart and chronic kidney disease with heart failure and stage 1 through stage 4 chronic kidney disease, or unspecified chronic kidney disease: Secondary | ICD-10-CM | POA: Diagnosis not present

## 2023-08-09 DIAGNOSIS — J449 Chronic obstructive pulmonary disease, unspecified: Secondary | ICD-10-CM | POA: Diagnosis not present

## 2023-08-09 DIAGNOSIS — Z87891 Personal history of nicotine dependence: Secondary | ICD-10-CM | POA: Insufficient documentation

## 2023-08-09 LAB — URINALYSIS, ROUTINE W REFLEX MICROSCOPIC
Bilirubin Urine: NEGATIVE
Glucose, UA: NEGATIVE mg/dL
Hgb urine dipstick: NEGATIVE
Ketones, ur: NEGATIVE mg/dL
Leukocytes,Ua: NEGATIVE
Nitrite: NEGATIVE
Protein, ur: NEGATIVE mg/dL
Specific Gravity, Urine: 1.015 (ref 1.005–1.030)
pH: 5 (ref 5.0–8.0)

## 2023-08-09 LAB — CBC WITH DIFFERENTIAL/PLATELET
Abs Immature Granulocytes: 0.01 10*3/uL (ref 0.00–0.07)
Basophils Absolute: 0 10*3/uL (ref 0.0–0.1)
Basophils Relative: 1 %
Eosinophils Absolute: 0.3 10*3/uL (ref 0.0–0.5)
Eosinophils Relative: 4 %
HCT: 37.9 % — ABNORMAL LOW (ref 39.0–52.0)
Hemoglobin: 12.8 g/dL — ABNORMAL LOW (ref 13.0–17.0)
Immature Granulocytes: 0 %
Lymphocytes Relative: 12 %
Lymphs Abs: 0.7 10*3/uL (ref 0.7–4.0)
MCH: 33.2 pg (ref 26.0–34.0)
MCHC: 33.8 g/dL (ref 30.0–36.0)
MCV: 98.4 fL (ref 80.0–100.0)
Monocytes Absolute: 0.5 10*3/uL (ref 0.1–1.0)
Monocytes Relative: 8 %
Neutro Abs: 4.5 10*3/uL (ref 1.7–7.7)
Neutrophils Relative %: 75 %
Platelets: 155 10*3/uL (ref 150–400)
RBC: 3.85 MIL/uL — ABNORMAL LOW (ref 4.22–5.81)
RDW: 13.5 % (ref 11.5–15.5)
WBC: 6 10*3/uL (ref 4.0–10.5)
nRBC: 0 % (ref 0.0–0.2)

## 2023-08-09 LAB — LACTIC ACID, PLASMA: Lactic Acid, Venous: 0.7 mmol/L (ref 0.5–1.9)

## 2023-08-09 LAB — COMPREHENSIVE METABOLIC PANEL
ALT: 23 U/L (ref 0–44)
AST: 21 U/L (ref 15–41)
Albumin: 3.4 g/dL — ABNORMAL LOW (ref 3.5–5.0)
Alkaline Phosphatase: 55 U/L (ref 38–126)
Anion gap: 4 — ABNORMAL LOW (ref 5–15)
BUN: 28 mg/dL — ABNORMAL HIGH (ref 8–23)
CO2: 26 mmol/L (ref 22–32)
Calcium: 8.6 mg/dL — ABNORMAL LOW (ref 8.9–10.3)
Chloride: 105 mmol/L (ref 98–111)
Creatinine, Ser: 1.22 mg/dL (ref 0.61–1.24)
GFR, Estimated: >60 mL/min (ref >60)
Glucose, Bld: 125 mg/dL — ABNORMAL HIGH (ref 70–99)
Potassium: 4.2 mmol/L (ref 3.5–5.1)
Sodium: 135 mmol/L (ref 135–145)
Total Bilirubin: 0.5 mg/dL (ref 0.3–1.2)
Total Protein: 6.7 g/dL (ref 6.5–8.1)

## 2023-08-09 MED ORDER — SODIUM CHLORIDE 0.9 % IV SOLN: 2.0000 g | Freq: Once | INTRAVENOUS | Status: DC

## 2023-08-09 MED ORDER — SULFAMETHOXAZOLE-TRIMETHOPRIM 800-160 MG PO TABS: 1.0000 | ORAL_TABLET | Freq: Two times a day (BID) | ORAL | 0 refills | Status: DC

## 2023-08-09 MED ORDER — CEFTRIAXONE SODIUM 1 G IJ SOLR: 1.0000 g | Freq: Once | INTRAMUSCULAR | Status: DC

## 2023-08-09 NOTE — ED Triage Notes (Signed)
Pt comes in with a bug bite to his right forearm. Pt states this happened on Sunday but uncertain what bite him. Pt was seen at the [place] on Monday and was given a shot and antibiotic. Pt has an outline of the bite and has doubled in size.

## 2023-08-09 NOTE — ED Provider Notes (Signed)
[place] EMERGENCY DEPARTMENT AT [facility] Provider Note  CSN: [id number] Arrival date & time: 08/09/23 0940  Chief Complaint(s) Insect Bite  HPI Mitchell Reo. is a 72 y.o. male with past medical history as below, significant for CKD, CHF,  SDH, SAH who presents to the ED with complaint of arm wound.  Patient reports some kind of bite or wound to his right forearm, he is unsure what exactly bit him or how he received the wound.  This occurred on Saturday.  He was seen at the [facility] clinic on Monday, started him on doxycycline advised him to come back today.  He presented to the [place] clinic again this morning they recommend he come to the ER given worsening wound to his forearm.  He has some pain around the site of infection, no significant discomfort with movement of his elbow or wrist on ipsilateral side.  No fevers, chills, nausea or vomiting.  He has noticed some purulence draining from the wound since this morning and the redness has increased outside of the margins that were marked on Monday.  Denies any other wounds, he is again unsure how he received the wound to what type of insect may have bitten him.  Past Medical History Past Medical History:  Diagnosis Date   Chronic diarrhea    CKD (chronic kidney disease) stage 3, GFR 30-59 ml/min (HCC)    COPD (chronic obstructive pulmonary disease) (HCC)    Dysrhythmia 02/2022   paroxsymal SVT & aflutter 02/2022 XioXT   Essential hypertension    Gallstones    Gout    History of CHF (congestive heart failure)    History of colonic polyps    History of DVT (deep vein thrombosis) 04/19/2012   BLE DVT 04/09/12   Iron deficiency 02/16/2023   Kidney stones    Lumbar disc disease    Neuropathy    Nonischemic cardiomyopathy (HCC) 2003   Minor coronary atherosclerosis at cardiac catheterization 2003, LVEF initially 20% with normalization   Obesity    Orthostatic hypotension    Presence of Watchman left atrial appendage closure  device 02/02/2023   27mm Watchman placed by Dr. Excell Seltzer   RA (rheumatoid arthritis) [facility])    Sleep apnea    Subarachnoid hemorrhage (HCC) 02/03/2022   Traumatic   Subdural hematoma (HCC) 02/03/2022   Traumatic   Syncope, vasovagal    Type 2 diabetes mellitus (HCC)    Patient Active Problem List   Diagnosis Date Noted   Iron deficiency 02/16/2023   PAF (paroxysmal atrial fibrillation) (HCC) 02/02/2023   Atrial fibrillation (HCC) 02/02/2023   Presence of Watchman left atrial appendage closure device 02/02/2023   Diarrhea 11/16/2022   Anemia 11/16/2022   Subdural hematoma (HCC) 05/04/2022   SDH (subdural hematoma) (HCC) 02/03/2022   Right ear pain 07/13/2021   Venous insufficiency of right leg 08/19/2020   Thrombocytopenia (HCC) 09/08/2018   Chronic diastolic congestive heart failure (HCC) 08/17/2017   Hypokalemia 05/23/2012   Rheumatoid aortitis 05/23/2012   Pulmonary embolism (HCC) 04/19/2012   Chronic renal insufficiency 04/17/2012   Obesity, morbid (HCC) 01/16/2012   CHF (congestive heart failure) (HCC) 01/16/2012   COPD (chronic obstructive pulmonary disease) (HCC) 01/16/2012   OSA on CPAP 01/16/2012   Gout 01/04/2012   Hypertension 01/04/2012   Home Medication(s) Prior to Admission medications   Medication Sig Start Date End Date Taking? Authorizing Provider  sulfamethoxazole-trimethoprim (BACTRIM DS) 800-160 MG tablet Take 1 tablet by mouth 2 (two) times daily for  7 days. 08/09/23 08/16/23 Yes Sloan Leiter, DO  acetaminophen (TYLENOL) 500 MG tablet Take 500 mg by mouth every 6 (six) hours as needed for mild pain.    [provider]  allopurinol (ZYLOPRIM) 300 MG tablet Take 1 tablet (300 mg total) by mouth every morning. 04/05/23   [name], MD  amLODipine-olmesartan (AZOR) 10-40 MG tablet Take 1 tablet by mouth daily. 04/05/23   [name], MD  atorvastatin (LIPITOR) 80 MG tablet TAKE 1 TABLET BY MOUTH DAILY AT  6 PM 04/05/23   [name], MD   calcitRIOL (ROCALTROL) 0.25 MCG capsule Take 1 capsule (0.25 mcg total) by mouth daily with breakfast. 04/05/23   [name], MD  calcium carbonate (OSCAL) 1500 (600 Ca) MG TABS tablet Take 600 mg of elemental calcium by mouth 2 (two) times daily with a meal.    [provider]  carvedilol (COREG) 6.25 MG tablet TAKE ONE-HALF TABLET BY MOUTH  EVERY MORNING AND 1 TABLET BY  MOUTH EVERY EVENING 05/29/23   [name], MD  celecoxib (CELEBREX) 200 MG capsule TAKE 1 CAPSULE BY MOUTH DAILY  WITH FOOD 06/27/23   [name], MD  cloNIDine (CATAPRES) 0.2 MG tablet TAKE 1 TABLET BY MOUTH TWICE  DAILY FOR BLOOD PRESSURE 07/26/23   [name], MD  Colchicine 0.6 MG CAPS Take 1 capsule by mouth daily. Patient taking differently: Take 1 capsule by mouth as needed (gout flareups). 03/23/22   [name], MD  DULoxetine (CYMBALTA) 30 MG capsule TAKE 1 CAPSULE BY MOUTH TWICE  DAILY Patient taking differently: Take 30 mg by mouth daily. 01/04/23   [name], MD  ferrous sulfate 325 (65 FE) MG tablet Take 325 mg by mouth in the morning.    [provider]  fluocinolone (SYNALAR) 0.01 % external solution Place 3-5 drops in each ear canal twice daily Patient taking differently: 3-5 drops 2 (two) times daily as needed (irritation/infection.). Place 3-5 drops in each ear canal twice daily 03/25/22   [name], MD  furosemide (LASIX) 40 MG tablet TAKE 1 TABLET BY MOUTH IN THE  MORNING 07/26/23   [name], MD  glimepiride (AMARYL) 2 MG tablet Take 1 tablet (2 mg total) by mouth daily before breakfast. 08/01/23   [name], MD  hydroxychloroquine (PLAQUENIL) 200 MG tablet TAKE 1 TABLET BY MOUTH IN THE  MORNING AND AT BEDTIME 07/26/23   [name], MD  Multiple Vitamin (MULTIVITAMIN WITH MINERALS) TABS tablet Take 1 tablet by mouth in the morning.    [provider]  pioglitazone (ACTOS) 30 MG tablet Take 1 tablet (30 mg total) by mouth daily. 08/01/23   [name],  MD  potassium chloride SA (KLOR-CON M) 20 MEQ tablet TAKE 1 TABLET BY MOUTH DAILY FOR POTASSIUM REPLACEMENT/  SUPPLEMENT 04/05/23   [name], MD  sildenafil (REVATIO) 20 MG tablet Take 2-5 pills at once, orally, with each sexual encounter 08/04/21   [name], MD  silver sulfADIAZINE (SILVADENE) 1 % cream Apply topically as needed. 11/03/22   [provider]  tamsulosin (FLOMAX) 0.4 MG CAPS capsule Take 1 capsule (0.4 mg total) by mouth at bedtime. 04/05/23   [name], MD  vitamin B-12 (CYANOCOBALAMIN) 500 MCG tablet Take 500 mcg by mouth in the morning.    [provider]  Past Surgical History Past Surgical History:  Procedure Laterality Date   BARIATRIC SURGERY  01/11/2018   BURR HOLE Right 05/04/2022   Procedure: BURR HOLES FOR Subdural Hematoma;  Surgeon: Bedelia Person, MD;  Location: [facility] OR;  Service: Neurosurgery;  Laterality: Right;   CHOLECYSTECTOMY  01/18/2012   Procedure: LAPAROSCOPIC CHOLECYSTECTOMY WITH INTRAOPERATIVE CHOLANGIOGRAM;  Surgeon: Clovis Pu. Cornett, MD;  Location: [facility] ORS;  Service: General;  Laterality: N/A;   COLONOSCOPY  05/2017   [facility]: five polyps removed but only 3 retrieved due to bowel prep, pathology with tubular adenomas   EYE SURGERY Right    Film over retina was removed.   LEFT ATRIAL APPENDAGE OCCLUSION N/A 02/02/2023   Procedure: LEFT ATRIAL APPENDAGE OCCLUSION;  Surgeon: Tonny Bollman, MD;  Location[facility] INVASIVE CV LAB;  Service: Cardiovascular;  Laterality: N/A;   PILONIDAL CYST EXCISION  1997   TEE WITHOUT CARDIOVERSION N/A 02/02/2023   Procedure: TRANSESOPHAGEAL ECHOCARDIOGRAM;  Surgeon: Tonny Bollman, MD;  Location: [facility] INVASIVE CV LAB;  Service: Cardiovascular;  Laterality: N/A;   TOE AMPUTATION Right    3 toes on right foot- big toe and next 2   UMBILICAL HERNIA  REPAIR     VARICOSE VEIN SURGERY     left leg   watchman implant     Family History Family History  Problem Relation Age of Onset   Hypertension Mother    Stroke Mother    Diabetes Father    Hypertension Father    Heart disease Father    Diabetes Brother    Colon cancer Neg Hx     Social History Social History   Tobacco Use   Smoking status: Former    Current packs/day: 0.00    Average packs/day: 2.0 packs/day for 25.0 years (50.0 ttl pk-yrs)    Types: Cigarettes    Start date: 12/26/1969    Quit date: 12/26/1994    Years since quitting: 28.6   Smokeless tobacco: Never  Vaping Use   Vaping status: Never Used  Substance Use Topics   Alcohol use: No   Drug use: No   Allergies Ciprofloxacin, Definity [perflutren lipid microsphere], and Levofloxacin [levofloxacin]  Review of Systems Review of Systems  Constitutional:  Negative for chills and fever.  HENT:  Negative for ear pain and sore throat.   Eyes:  Negative for pain and visual disturbance.  Respiratory:  Negative for cough and shortness of breath.   Cardiovascular:  Negative for chest pain and palpitations.  Gastrointestinal:  Negative for abdominal pain, diarrhea and vomiting.  Genitourinary:  Negative for dysuria and hematuria.  Musculoskeletal:  Negative for arthralgias and back pain.  Skin:  Positive for wound. Negative for color change and rash.  Neurological:  Negative for seizures and syncope.  All other systems reviewed and are negative.   Physical Exam Vital Signs  I have reviewed the triage vital signs BP (!) 139/54 (BP Location: Right Arm)   Pulse 73   Temp 98.8 F (37.1 C) (Oral)   Resp 18   Ht 6\' 1"  (1.854 m)   Wt (!) 141.5 kg   SpO2 97%   BMI 41.16 kg/m  Physical Exam Vitals and nursing note reviewed.  Constitutional:      General: He is not in acute distress.    Appearance: Normal appearance. He is well-developed. He is not ill-appearing.  HENT:     Head: Normocephalic and  atraumatic.     Right Ear: External ear normal.     Left  Ear: External ear normal.     Nose: Nose normal.     Mouth/Throat:     Mouth: Mucous membranes are moist.  Eyes:     General: No scleral icterus.       Right eye: No discharge.        Left eye: No discharge.  Cardiovascular:     Rate and Rhythm: Normal rate.     Pulses: Normal pulses.  Pulmonary:     Effort: Pulmonary effort is normal. No respiratory distress.     Breath sounds: No stridor.  Abdominal:     General: Abdomen is flat. There is no distension.     Tenderness: There is no guarding.  Musculoskeletal:        General: No deformity.     Cervical back: No rigidity.  Skin:    General: Skin is warm and dry.     Capillary Refill: Capillary refill takes less than 2 seconds.     Coloration: Skin is not cyanotic, jaundiced or pale.          Comments: Right upper extremity is NVI   Neurological:     Mental Status: He is alert and oriented to person, place, and time.     GCS: GCS eye subscore is 4. GCS verbal subscore is 5. GCS motor subscore is 6.  Psychiatric:        Speech: Speech normal.        Behavior: Behavior normal. Behavior is cooperative.     ED Results and Treatments Labs (all labs ordered are listed, but only abnormal results are displayed) Labs Reviewed  COMPREHENSIVE METABOLIC PANEL - Abnormal; Notable for the following components:      Result Value   Glucose, Bld 125 (*)    BUN 28 (*)    Calcium 8.6 (*)    Albumin 3.4 (*)    Anion gap 4 (*)    All other components within normal limits  CBC WITH DIFFERENTIAL/PLATELET - Abnormal; Notable for the following components:   RBC 3.85 (*)    Hemoglobin 12.8 (*)    HCT 37.9 (*)    All other components within normal limits  AEROBIC/ANAEROBIC CULTURE W GRAM STAIN (SURGICAL/DEEP WOUND)  LACTIC ACID, PLASMA  URINALYSIS, ROUTINE W REFLEX MICROSCOPIC                                                                                                                           Radiology DG Forearm Right  Result Date: 08/09/2023 CLINICAL DATA:  Possible spider bite on Saturday. Progressive redness, pain and swelling. Evaluate for cortical irregularity. EXAM: RIGHT FOREARM - 2 VIEW COMPARISON:  None Available. FINDINGS: Skin thickening and soft tissue edema is noted along the volar surface of the mid and distal forearm. The underlying osseous structures appear intact. There is no sign of acute fracture, dislocation or bone erosion to suggest osteomyelitis. Degenerative changes noted at the radiocarpal joint. Vascular calcifications identified. IMPRESSION: 1. Skin thickening and  soft tissue edema along the volar surface of the mid and distal forearm. 2. No signs of osteomyelitis. Electronically Signed   By: Signa Kell M.D.   On: 08/09/2023 12:56    Pertinent labs & imaging results that were available during my care of the patient were reviewed by me and considered in my medical decision making (see MDM for details).  Medications Ordered in ED Medications - No data to display                                                                                                                                   Procedures Procedures  (including critical care time)  Medical Decision Making / ED Course    Medical Decision Making:    Mitchell Stinebaugh. is a 72 y.o. male with past medical history as below, significant for CKD, CHF,  SDH, SAH who presents to the ED with complaint of arm wound.. The complaint involves an extensive differential diagnosis and also carries with it a high risk of complications and morbidity.  Serious etiology was considered. Ddx includes but is not limited to: Cellulitis, osteomyelitis, insect bite, etc.  Complete initial physical exam performed, notably the patient  was NAD, afebrile, pain is mild.    Reviewed and confirmed nursing documentation for past medical history, family history, social history.  Vital signs reviewed.     Clinical Course as of 08/09/23 1310  Wed Aug 09, 2023  1239 Spoke w/ pharmacy, recommend culture and start bactrim 5-7 days.  [SG]  1239 Labs are stable, HDS, no fever, not septic, well appearing overall. Will broaden coverage. Stop doxy, start bactrim. Close op f/u [SG]    Clinical Course User Index [SG] Sloan Leiter, DO    Narrative: 72 year old male here with worsening wound to his right forearm Has been on doxycycline for 2 days and the wound erythema has worsened and has become larger Right upper extremity is NVI. He has no systemic complaints, no fever.  Tolerant p.o. intake without difficulty. Not septic Renal fxn stable Will adjust abx Wound care encouraged Recommend close pcp f/u  The patient improved significantly and was discharged in stable condition. Detailed discussions were had with the patient regarding current findings, and need for close f/u with PCP or on call doctor. The patient has been instructed to return immediately if the symptoms worsen in any way for re-evaluation. Patient verbalized understanding and is in agreement with current care plan. All questions answered prior to discharge.                  Additional history obtained: -Additional history obtained from spouse -External records from outside source obtained and reviewed including: Chart review including previous notes, labs, imaging, consultation notes including  Office visit earlier today   Lab Tests: -I ordered, reviewed, and interpreted labs.   The pertinent results include:   Labs Reviewed  COMPREHENSIVE METABOLIC PANEL -  Abnormal; Notable for the following components:      Result Value   Glucose, Bld 125 (*)    BUN 28 (*)    Calcium 8.6 (*)    Albumin 3.4 (*)    Anion gap 4 (*)    All other components within normal limits  CBC WITH DIFFERENTIAL/PLATELET - Abnormal; Notable for the following components:   RBC 3.85 (*)    Hemoglobin 12.8 (*)    HCT 37.9 (*)     All other components within normal limits  AEROBIC/ANAEROBIC CULTURE W GRAM STAIN (SURGICAL/DEEP WOUND)  LACTIC ACID, PLASMA  URINALYSIS, ROUTINE W REFLEX MICROSCOPIC    Notable for labs stable, no leukocytosis  EKG   EKG Interpretation Date/Time:    Ventricular Rate:    PR Interval:    QRS Duration:    QT Interval:    QTC Calculation:   R Axis:      Text Interpretation:           Imaging Studies ordered: I ordered imaging studies including forearm xr I independently visualized the following imaging with scope of interpretation limited to determining acute life threatening conditions related to emergency care; findings noted above, significant for no osteomyelitis  I independently visualized and interpreted imaging. I agree with the radiologist interpretation   Medicines ordered and prescription drug management: Meds ordered this encounter  Medications   DISCONTD: cefTRIAXone (ROCEPHIN) 2 g in sodium chloride 0.9 % 100 mL IVPB    Order Specific Question:   Antibiotic Indication:    Answer:   Cellulitis   DISCONTD: cefTRIAXone (ROCEPHIN) injection 1 g    Order Specific Question:   Antibiotic Indication:    Answer:   Cellulitis   sulfamethoxazole-trimethoprim (BACTRIM DS) 800-160 MG tablet    Sig: Take 1 tablet by mouth 2 (two) times daily for 7 days.    Dispense:  14 tablet    Refill:  0    -I have reviewed the patients home medicines and have made adjustments as needed   Consultations Obtained: I requested consultation with the pharmacy,  and discussed lab and imaging findings as well as pertinent plan - they recommend: abx adjust as above   Cardiac Monitoring: Continuous pulse oximetry interpreted by myself, 99% on RA.    Social Determinants of Health:  Diagnosis or treatment significantly limited by social determinants of health: former smoker   Reevaluation: After the interventions noted above, I reevaluated the patient and found that they have stayed  the same  Co morbidities that complicate the patient evaluation  Past Medical History:  Diagnosis Date   Chronic diarrhea    CKD (chronic kidney disease) stage 3, GFR 30-59 ml/min (HCC)    COPD (chronic obstructive pulmonary disease) (HCC)    Dysrhythmia 02/2022   paroxsymal SVT & aflutter 02/2022 XioXT   Essential hypertension    Gallstones    Gout    History of CHF (congestive heart failure)    History of colonic polyps    History of DVT (deep vein thrombosis) 04/19/2012   BLE DVT 04/09/12   Iron deficiency 02/16/2023   Kidney stones    Lumbar disc disease    Neuropathy    Nonischemic cardiomyopathy (HCC) 2003   Minor coronary atherosclerosis at cardiac catheterization 2003, LVEF initially 20% with normalization   Obesity    Orthostatic hypotension    Presence of Watchman left atrial appendage closure device 02/02/2023   27mm Watchman placed by Dr. Excell Seltzer   RA (rheumatoid arthritis) (HCC)  Sleep apnea    Subarachnoid hemorrhage (HCC) 02/03/2022   Traumatic   Subdural hematoma (HCC) 02/03/2022   Traumatic   Syncope, vasovagal    Type 2 diabetes mellitus (HCC)       Dispostion: Disposition decision including need for hospitalization was considered, and patient discharged from emergency department.    Final Clinical Impression(s) / ED Diagnoses Final diagnoses:  None        Sloan Leiter, DO 08/09/23 1310

## 2023-08-09 NOTE — Discharge Instructions (Signed)
It was a pleasure caring for you today in the emergency department.  Please clean the wound twice daily with gentle soap and water and apply dry bandage.  Follow-up with PCP for recheck in the next 3 to 5 days.  Please return to the emergency department for any worsening or worrisome symptoms.

## 2023-08-09 NOTE — ED Notes (Signed)
Pt given gauze and bandages for home use.

## 2023-08-09 NOTE — ED Notes (Signed)
ED Provider at bedside. 

## 2023-08-11 ENCOUNTER — Inpatient Hospital Stay (HOSPITAL_COMMUNITY)
Admission: EM | Admit: 2023-08-11 | Discharge: 2023-08-14 | DRG: 571 | Disposition: A | Discharge status: Home / Self Care | Payer: No Typology Code available for payment source | Attending: Internal Medicine | Admitting: Internal Medicine

## 2023-08-11 ENCOUNTER — Inpatient Hospital Stay: Payer: No Typology Code available for payment source | Attending: Hematology

## 2023-08-11 ENCOUNTER — Encounter (HOSPITAL_COMMUNITY): Payer: Self-pay

## 2023-08-11 ENCOUNTER — Other Ambulatory Visit: Payer: Self-pay

## 2023-08-11 DIAGNOSIS — Z89411 Acquired absence of right great toe: Secondary | ICD-10-CM

## 2023-08-11 DIAGNOSIS — I428 Other cardiomyopathies: Secondary | ICD-10-CM | POA: Diagnosis present

## 2023-08-11 DIAGNOSIS — I509 Heart failure, unspecified: Secondary | ICD-10-CM

## 2023-08-11 DIAGNOSIS — Z6839 Body mass index (BMI) 39.0-39.9, adult: Secondary | ICD-10-CM | POA: Diagnosis not present

## 2023-08-11 DIAGNOSIS — Z8673 Personal history of transient ischemic attack (TIA), and cerebral infarction without residual deficits: Secondary | ICD-10-CM

## 2023-08-11 DIAGNOSIS — D696 Thrombocytopenia, unspecified: Secondary | ICD-10-CM | POA: Diagnosis present

## 2023-08-11 DIAGNOSIS — J449 Chronic obstructive pulmonary disease, unspecified: Secondary | ICD-10-CM | POA: Diagnosis present

## 2023-08-11 DIAGNOSIS — Z86718 Personal history of other venous thrombosis and embolism: Secondary | ICD-10-CM

## 2023-08-11 DIAGNOSIS — E1140 Type 2 diabetes mellitus with diabetic neuropathy, unspecified: Secondary | ICD-10-CM | POA: Diagnosis not present

## 2023-08-11 DIAGNOSIS — Z95818 Presence of other cardiac implants and grafts: Secondary | ICD-10-CM | POA: Diagnosis not present

## 2023-08-11 DIAGNOSIS — N183 Chronic kidney disease, stage 3 unspecified: Secondary | ICD-10-CM | POA: Diagnosis not present

## 2023-08-11 DIAGNOSIS — Z791 Long term (current) use of non-steroidal anti-inflammatories (NSAID): Secondary | ICD-10-CM

## 2023-08-11 DIAGNOSIS — Y92096 Garden or yard of other non-institutional residence as the place of occurrence of the external cause: Secondary | ICD-10-CM | POA: Diagnosis not present

## 2023-08-11 DIAGNOSIS — I480 Paroxysmal atrial fibrillation: Secondary | ICD-10-CM | POA: Diagnosis not present

## 2023-08-11 DIAGNOSIS — I96 Gangrene, not elsewhere classified: Secondary | ICD-10-CM | POA: Diagnosis present

## 2023-08-11 DIAGNOSIS — M65031 Abscess of tendon sheath, right forearm: Secondary | ICD-10-CM | POA: Diagnosis not present

## 2023-08-11 DIAGNOSIS — N400 Enlarged prostate without lower urinary tract symptoms: Secondary | ICD-10-CM | POA: Diagnosis present

## 2023-08-11 DIAGNOSIS — L02413 Cutaneous abscess of right upper limb: Principal | ICD-10-CM | POA: Diagnosis present

## 2023-08-11 DIAGNOSIS — E1122 Type 2 diabetes mellitus with diabetic chronic kidney disease: Secondary | ICD-10-CM | POA: Diagnosis not present

## 2023-08-11 DIAGNOSIS — Z87891 Personal history of nicotine dependence: Secondary | ICD-10-CM

## 2023-08-11 DIAGNOSIS — Z8249 Family history of ischemic heart disease and other diseases of the circulatory system: Secondary | ICD-10-CM

## 2023-08-11 DIAGNOSIS — N189 Chronic kidney disease, unspecified: Secondary | ICD-10-CM | POA: Diagnosis present

## 2023-08-11 DIAGNOSIS — S50861A Insect bite (nonvenomous) of right forearm, initial encounter: Secondary | ICD-10-CM | POA: Diagnosis not present

## 2023-08-11 DIAGNOSIS — Z79899 Other long term (current) drug therapy: Secondary | ICD-10-CM | POA: Insufficient documentation

## 2023-08-11 DIAGNOSIS — I130 Hypertensive heart and chronic kidney disease with heart failure and stage 1 through stage 4 chronic kidney disease, or unspecified chronic kidney disease: Secondary | ICD-10-CM | POA: Diagnosis present

## 2023-08-11 DIAGNOSIS — I4891 Unspecified atrial fibrillation: Secondary | ICD-10-CM | POA: Diagnosis present

## 2023-08-11 DIAGNOSIS — E1151 Type 2 diabetes mellitus with diabetic peripheral angiopathy without gangrene: Secondary | ICD-10-CM | POA: Diagnosis present

## 2023-08-11 DIAGNOSIS — L089 Local infection of the skin and subcutaneous tissue, unspecified: Secondary | ICD-10-CM

## 2023-08-11 DIAGNOSIS — M069 Rheumatoid arthritis, unspecified: Secondary | ICD-10-CM | POA: Diagnosis not present

## 2023-08-11 DIAGNOSIS — Z86711 Personal history of pulmonary embolism: Secondary | ICD-10-CM | POA: Insufficient documentation

## 2023-08-11 DIAGNOSIS — E611 Iron deficiency: Secondary | ICD-10-CM

## 2023-08-11 DIAGNOSIS — R161 Splenomegaly, not elsewhere classified: Secondary | ICD-10-CM | POA: Insufficient documentation

## 2023-08-11 DIAGNOSIS — T148XXA Other injury of unspecified body region, initial encounter: Secondary | ICD-10-CM | POA: Diagnosis not present

## 2023-08-11 DIAGNOSIS — Z823 Family history of stroke: Secondary | ICD-10-CM

## 2023-08-11 DIAGNOSIS — Z833 Family history of diabetes mellitus: Secondary | ICD-10-CM

## 2023-08-11 DIAGNOSIS — L03113 Cellulitis of right upper limb: Secondary | ICD-10-CM | POA: Diagnosis not present

## 2023-08-11 DIAGNOSIS — Z881 Allergy status to other antibiotic agents status: Secondary | ICD-10-CM

## 2023-08-11 DIAGNOSIS — E785 Hyperlipidemia, unspecified: Secondary | ICD-10-CM | POA: Diagnosis not present

## 2023-08-11 DIAGNOSIS — I872 Venous insufficiency (chronic) (peripheral): Secondary | ICD-10-CM | POA: Diagnosis present

## 2023-08-11 DIAGNOSIS — G4733 Obstructive sleep apnea (adult) (pediatric): Secondary | ICD-10-CM | POA: Diagnosis present

## 2023-08-11 DIAGNOSIS — I5032 Chronic diastolic (congestive) heart failure: Secondary | ICD-10-CM | POA: Diagnosis not present

## 2023-08-11 DIAGNOSIS — S065XAA Traumatic subdural hemorrhage with loss of consciousness status unknown, initial encounter: Secondary | ICD-10-CM | POA: Diagnosis present

## 2023-08-11 DIAGNOSIS — Z89421 Acquired absence of other right toe(s): Secondary | ICD-10-CM

## 2023-08-11 DIAGNOSIS — N1832 Chronic kidney disease, stage 3b: Secondary | ICD-10-CM | POA: Diagnosis present

## 2023-08-11 DIAGNOSIS — M109 Gout, unspecified: Secondary | ICD-10-CM | POA: Diagnosis present

## 2023-08-11 DIAGNOSIS — Z7984 Long term (current) use of oral hypoglycemic drugs: Secondary | ICD-10-CM

## 2023-08-11 LAB — BASIC METABOLIC PANEL
Anion gap: 12 (ref 5–15)
BUN: 35 mg/dL — ABNORMAL HIGH (ref 8–23)
CO2: 22 mmol/L (ref 22–32)
Calcium: 8.2 mg/dL — ABNORMAL LOW (ref 8.9–10.3)
Chloride: 102 mmol/L (ref 98–111)
Creatinine, Ser: 1.4 mg/dL — ABNORMAL HIGH (ref 0.61–1.24)
GFR, Estimated: 54 mL/min — ABNORMAL LOW (ref >60)
Glucose, Bld: 90 mg/dL (ref 70–99)
Potassium: 4.4 mmol/L (ref 3.5–5.1)
Sodium: 136 mmol/L (ref 135–145)

## 2023-08-11 LAB — CBC WITH DIFFERENTIAL/PLATELET
Abs Immature Granulocytes: 0.02 10*3/uL (ref 0.00–0.07)
Basophils Absolute: 0 10*3/uL (ref 0.0–0.1)
Basophils Relative: 1 %
Eosinophils Absolute: 0.2 10*3/uL (ref 0.0–0.5)
Eosinophils Relative: 5 %
HCT: 38.1 % — ABNORMAL LOW (ref 39.0–52.0)
Hemoglobin: 12.7 g/dL — ABNORMAL LOW (ref 13.0–17.0)
Immature Granulocytes: 0 %
Lymphocytes Relative: 14 %
Lymphs Abs: 0.7 10*3/uL (ref 0.7–4.0)
MCH: 32.9 pg (ref 26.0–34.0)
MCHC: 33.3 g/dL (ref 30.0–36.0)
MCV: 98.7 fL (ref 80.0–100.0)
Monocytes Absolute: 0.4 10*3/uL (ref 0.1–1.0)
Monocytes Relative: 9 %
Neutro Abs: 3.6 10*3/uL (ref 1.7–7.7)
Neutrophils Relative %: 71 %
Platelets: 146 10*3/uL — ABNORMAL LOW (ref 150–400)
RBC: 3.86 MIL/uL — ABNORMAL LOW (ref 4.22–5.81)
RDW: 13.4 % (ref 11.5–15.5)
WBC: 4.9 10*3/uL (ref 4.0–10.5)
nRBC: 0 % (ref 0.0–0.2)

## 2023-08-11 LAB — COMPREHENSIVE METABOLIC PANEL
ALT: 30 U/L (ref 0–44)
AST: 30 U/L (ref 15–41)
Albumin: 3.4 g/dL — ABNORMAL LOW (ref 3.5–5.0)
Alkaline Phosphatase: 57 U/L (ref 38–126)
Anion gap: 12 (ref 5–15)
BUN: 35 mg/dL — ABNORMAL HIGH (ref 8–23)
CO2: 20 mmol/L — ABNORMAL LOW (ref 22–32)
Calcium: 8.2 mg/dL — ABNORMAL LOW (ref 8.9–10.3)
Chloride: 104 mmol/L (ref 98–111)
Creatinine, Ser: 1.28 mg/dL — ABNORMAL HIGH (ref 0.61–1.24)
GFR, Estimated: >60 mL/min (ref >60)
Glucose, Bld: 123 mg/dL — ABNORMAL HIGH (ref 70–99)
Potassium: 4.5 mmol/L (ref 3.5–5.1)
Sodium: 136 mmol/L (ref 135–145)
Total Bilirubin: 0.3 mg/dL (ref 0.3–1.2)
Total Protein: 6.5 g/dL (ref 6.5–8.1)

## 2023-08-11 LAB — IRON AND TIBC
Iron: 78 ug/dL (ref 45–182)
Saturation Ratios: 31 % (ref 17.9–39.5)
TIBC: 255 ug/dL (ref 250–450)
UIBC: 177 ug/dL

## 2023-08-11 LAB — CBC
HCT: 37.2 % — ABNORMAL LOW (ref 39.0–52.0)
Hemoglobin: 12.5 g/dL — ABNORMAL LOW (ref 13.0–17.0)
MCH: 33 pg (ref 26.0–34.0)
MCHC: 33.6 g/dL (ref 30.0–36.0)
MCV: 98.2 fL (ref 80.0–100.0)
Platelets: 147 10*3/uL — ABNORMAL LOW (ref 150–400)
RBC: 3.79 MIL/uL — ABNORMAL LOW (ref 4.22–5.81)
RDW: 13.4 % (ref 11.5–15.5)
WBC: 4.9 10*3/uL (ref 4.0–10.5)
nRBC: 0 % (ref 0.0–0.2)

## 2023-08-11 LAB — LACTATE DEHYDROGENASE: LDH: 156 U/L (ref 98–192)

## 2023-08-11 LAB — FERRITIN: Ferritin: 145 ng/mL (ref 24–336)

## 2023-08-11 LAB — GLUCOSE, CAPILLARY: Glucose-Capillary: 86 mg/dL (ref 70–99)

## 2023-08-11 MED ORDER — DULOXETINE HCL 30 MG PO CPEP
30.0000 mg | ORAL_CAPSULE | Freq: Two times a day (BID) | ORAL | Status: DC
  Administered 2023-08-12 – 2023-08-14 (×5): 30 mg via ORAL
  Filled 2023-08-11 (×6): qty 1

## 2023-08-11 MED ORDER — ENOXAPARIN SODIUM 40 MG/0.4ML IJ SOSY: 40.0000 mg | PREFILLED_SYRINGE | INTRAMUSCULAR | Status: DC

## 2023-08-11 MED ORDER — FUROSEMIDE 40 MG PO TABS
40.0000 mg | ORAL_TABLET | Freq: Every morning | ORAL | Status: DC
  Administered 2023-08-12 – 2023-08-14 (×3): 40 mg via ORAL
  Filled 2023-08-11 (×3): qty 1

## 2023-08-11 MED ORDER — VANCOMYCIN HCL IN DEXTROSE 1-5 GM/200ML-% IV SOLN
1000.0000 mg | INTRAVENOUS | Status: AC
  Administered 2023-08-11: 1000 mg via INTRAVENOUS
  Filled 2023-08-11: qty 200

## 2023-08-11 MED ORDER — SODIUM CHLORIDE 0.9 % IV SOLN
2.0000 g | INTRAVENOUS | Status: DC
  Administered 2023-08-11 – 2023-08-13 (×3): 2 g via INTRAVENOUS
  Filled 2023-08-11 (×3): qty 20

## 2023-08-11 MED ORDER — CARVEDILOL 6.25 MG PO TABS
6.2500 mg | ORAL_TABLET | Freq: Every day | ORAL | Status: DC
  Administered 2023-08-12 – 2023-08-13 (×2): 6.25 mg via ORAL
  Filled 2023-08-11 (×3): qty 1

## 2023-08-11 MED ORDER — VANCOMYCIN HCL IN DEXTROSE 1-5 GM/200ML-% IV SOLN
1000.0000 mg | Freq: Once | INTRAVENOUS | Status: AC
  Administered 2023-08-11: 1000 mg via INTRAVENOUS
  Filled 2023-08-11: qty 200

## 2023-08-11 MED ORDER — VANCOMYCIN HCL 1250 MG/250ML IV SOLN
1250.0000 mg | Freq: Two times a day (BID) | INTRAVENOUS | Status: DC
  Administered 2023-08-12 – 2023-08-13 (×3): 1250 mg via INTRAVENOUS
  Filled 2023-08-11 (×3): qty 250

## 2023-08-11 MED ORDER — INSULIN ASPART 100 UNIT/ML IJ SOLN
0.0000 [IU] | Freq: Three times a day (TID) | INTRAMUSCULAR | Status: DC
  Administered 2023-08-13: 2 [IU] via SUBCUTANEOUS

## 2023-08-11 MED ORDER — METRONIDAZOLE 500 MG/100ML IV SOLN
500.0000 mg | Freq: Two times a day (BID) | INTRAVENOUS | Status: DC
  Administered 2023-08-11 – 2023-08-14 (×6): 500 mg via INTRAVENOUS
  Filled 2023-08-11 (×7): qty 100

## 2023-08-11 MED ORDER — AMLODIPINE-OLMESARTAN 10-40 MG PO TABS: 1.0000 | ORAL_TABLET | Freq: Every day | ORAL | Status: DC

## 2023-08-11 MED ORDER — ATORVASTATIN CALCIUM 10 MG PO TABS
20.0000 mg | ORAL_TABLET | Freq: Every day | ORAL | Status: DC
  Administered 2023-08-12 – 2023-08-14 (×3): 20 mg via ORAL
  Filled 2023-08-11 (×3): qty 2

## 2023-08-11 MED ORDER — VITAMIN B-12 1000 MCG PO TABS
500.0000 ug | ORAL_TABLET | Freq: Every morning | ORAL | Status: DC
  Administered 2023-08-12 – 2023-08-14 (×3): 500 ug via ORAL
  Filled 2023-08-11 (×4): qty 1

## 2023-08-11 MED ORDER — CLONIDINE HCL 0.1 MG PO TABS
0.2000 mg | ORAL_TABLET | Freq: Two times a day (BID) | ORAL | Status: DC
  Administered 2023-08-12 – 2023-08-14 (×5): 0.2 mg via ORAL
  Filled 2023-08-11 (×4): qty 2
  Filled 2023-08-11: qty 1
  Filled 2023-08-11 (×2): qty 2

## 2023-08-11 MED ORDER — ENOXAPARIN SODIUM 80 MG/0.8ML IJ SOSY
70.0000 mg | PREFILLED_SYRINGE | INTRAMUSCULAR | Status: DC
  Administered 2023-08-12 – 2023-08-13 (×2): 70 mg via SUBCUTANEOUS
  Filled 2023-08-11 (×2): qty 0.8

## 2023-08-11 MED ORDER — AMLODIPINE BESYLATE 10 MG PO TABS
10.0000 mg | ORAL_TABLET | Freq: Every day | ORAL | Status: DC
  Administered 2023-08-12 – 2023-08-14 (×3): 10 mg via ORAL
  Filled 2023-08-11 (×3): qty 1

## 2023-08-11 MED ORDER — ALLOPURINOL 100 MG PO TABS
300.0000 mg | ORAL_TABLET | Freq: Every morning | ORAL | Status: DC
  Administered 2023-08-12 – 2023-08-14 (×3): 300 mg via ORAL
  Filled 2023-08-11 (×3): qty 3

## 2023-08-11 MED ORDER — TAMSULOSIN HCL 0.4 MG PO CAPS
0.4000 mg | ORAL_CAPSULE | Freq: Every day | ORAL | Status: DC
  Administered 2023-08-12 – 2023-08-13 (×2): 0.4 mg via ORAL
  Filled 2023-08-11 (×3): qty 1

## 2023-08-11 MED ORDER — ACETAMINOPHEN 325 MG PO TABS
650.0000 mg | ORAL_TABLET | Freq: Four times a day (QID) | ORAL | Status: DC | PRN
  Administered 2023-08-11 – 2023-08-14 (×4): 650 mg via ORAL
  Filled 2023-08-11 (×4): qty 2

## 2023-08-11 MED ORDER — POTASSIUM CHLORIDE CRYS ER 20 MEQ PO TBCR
10.0000 meq | EXTENDED_RELEASE_TABLET | Freq: Every day | ORAL | Status: DC
  Administered 2023-08-12 – 2023-08-14 (×3): 10 meq via ORAL
  Filled 2023-08-11 (×3): qty 1

## 2023-08-11 MED ORDER — CALCITRIOL 0.25 MCG PO CAPS
0.2500 ug | ORAL_CAPSULE | Freq: Every day | ORAL | Status: DC
  Administered 2023-08-12 – 2023-08-14 (×3): 0.25 ug via ORAL
  Filled 2023-08-11 (×3): qty 1

## 2023-08-11 MED ORDER — IRBESARTAN 300 MG PO TABS
300.0000 mg | ORAL_TABLET | Freq: Every day | ORAL | Status: DC
  Administered 2023-08-12 – 2023-08-14 (×3): 300 mg via ORAL
  Filled 2023-08-11 (×3): qty 1

## 2023-08-11 MED ORDER — HYDROXYCHLOROQUINE SULFATE 200 MG PO TABS
200.0000 mg | ORAL_TABLET | Freq: Two times a day (BID) | ORAL | Status: DC
  Administered 2023-08-12 – 2023-08-14 (×5): 200 mg via ORAL
  Filled 2023-08-11 (×7): qty 1

## 2023-08-11 MED ORDER — CARVEDILOL 6.25 MG PO TABS
3.1250 mg | ORAL_TABLET | Freq: Every day | ORAL | Status: DC
  Administered 2023-08-12 – 2023-08-14 (×3): 3.125 mg via ORAL
  Filled 2023-08-11 (×3): qty 1

## 2023-08-11 MED ORDER — HYDRALAZINE HCL 20 MG/ML IJ SOLN: 5.0000 mg | INTRAMUSCULAR | Status: DC | PRN

## 2023-08-11 MED ORDER — ACETAMINOPHEN 650 MG RE SUPP: 650.0000 mg | Freq: Four times a day (QID) | RECTAL | Status: DC | PRN

## 2023-08-11 MED ORDER — INSULIN ASPART 100 UNIT/ML IJ SOLN: 0.0000 [IU] | Freq: Every day | INTRAMUSCULAR | Status: DC

## 2023-08-11 MED ORDER — ALBUTEROL SULFATE (2.5 MG/3ML) 0.083% IN NEBU: 2.5000 mg | INHALATION_SOLUTION | RESPIRATORY_TRACT | Status: DC | PRN

## 2023-08-11 MED ORDER — CARVEDILOL 3.125 MG PO TABS: 6.2500 mg | ORAL_TABLET | Freq: Two times a day (BID) | ORAL | Status: DC

## 2023-08-11 MED ORDER — OXYCODONE HCL 5 MG PO TABS
5.0000 mg | ORAL_TABLET | ORAL | Status: DC | PRN
  Administered 2023-08-12 – 2023-08-14 (×8): 5 mg via ORAL
  Filled 2023-08-11 (×8): qty 1

## 2023-08-11 NOTE — ED Triage Notes (Signed)
Pt presents with spider bite to right forearm. Was seen here for same 2 days ago. Was placed on antibiotics but swelling and redness are betting bigger and more drainage.

## 2023-08-11 NOTE — ED Notes (Signed)
[facility] at bedside 

## 2023-08-11 NOTE — Progress Notes (Signed)
Pharmacy Antibiotic Note  Mitchell Parker. is a 72 y.o. male admitted on 08/11/2023 with cellulitis s/p spider bite.  Has tried doxycycline and bactrim pta with no improvement. BCx drawn, wound Cx with staph aureus. WBC wnl Cr 1.3. Pharmacy has been consulted for vancomycin dosing. S/p vancomycin 1gm x1 given in ED  Plan: Vancomycin 1gm x1 stat for 2gm load then 1250mg  IV q12h est AUC 490 Ceftriaxone 2gm q24 per MD Metronidazole 500mg  q12h per MD also ordered F/u culture data  Height: 6\' 1"  (185.4 cm) Weight: (!) 136.5 kg (301 lb) IBW/kg (Calculated) : 79.9  Temp (24hrs), Avg:98.4 F (36.9 C), Min:98 F (36.7 C), Max:98.8 F (37.1 C)  Recent Labs  Lab 08/09/23 1038 08/11/23 1233 08/11/23 1649  WBC 6.0 4.9 4.9  CREATININE 1.22 1.28* 1.40*  LATICACIDVEN 0.7  --   --     Estimated Creatinine Clearance: 71.2 mL/min (A) (by C-G formula based on SCr of 1.4 mg/dL (H)).    Allergies  Allergen Reactions   Ciprofloxacin Hives   Definity [Perflutren Lipid Microsphere] Nausea And Vomiting    During ECHO with Definity injection pt became nauseated and vomited    Levofloxacin [Levofloxacin] Hives    Antimicrobials this admission:   Dose adjustments this admission:   Microbiology results: 8/16 BCx: ip 8/14 wound cx: staph aureus  Leota Sauers Pharm.D. CPP, BCPS Clinical Pharmacist [phone] 08/11/2023 8:02 PM

## 2023-08-11 NOTE — ED Provider Notes (Signed)
[place] EMERGENCY DEPARTMENT AT [facility] Provider Note   CSN: [id number] Arrival date & time: 08/11/23  1541     History  Chief Complaint  Patient presents with   Insect Bite    Mitchell Pronovost. is a 72 y.o. male with history of neuropathy, COPD, gallstones, rheumatoid arthritis, sleep apnea, nonischemic cardiomyopathy, hypertension, chronic kidney disease, CHF with Watchman device, who presents to the emergency department complaining of a skin infection.  Patient initially believes he was bit by a spider on his right forearm on 8/10.  He went to his doctor at the [place] who gave him a course of doxycycline.  On 8/14 he called the office regarding worsening of his symptoms, they sent him to the ER for further evaluation.  He was changed from doxycycline to Bactrim and was discharged.  He states since then the redness is worsening, with swelling, and drainage of pus.  Does not feel he has had a fever.  Was taking the antibiotics as prescribed.  HPI     Home Medications Prior to Admission medications   Medication Sig Start Date End Date Taking? Authorizing Provider  acetaminophen (TYLENOL) 500 MG tablet Take 500 mg by mouth every 6 (six) hours as needed for mild pain.    [provider]  allopurinol (ZYLOPRIM) 300 MG tablet Take 1 tablet (300 mg total) by mouth every morning. 04/05/23   [name], MD  amLODipine-olmesartan (AZOR) 10-40 MG tablet Take 1 tablet by mouth daily. 04/05/23   [name], MD  atorvastatin (LIPITOR) 80 MG tablet TAKE 1 TABLET BY MOUTH DAILY AT  6 PM 04/05/23   [name], MD  calcitRIOL (ROCALTROL) 0.25 MCG capsule Take 1 capsule (0.25 mcg total) by mouth daily with breakfast. 04/05/23   [name], MD  calcium carbonate (OSCAL) 1500 (600 Ca) MG TABS tablet Take 600 mg of elemental calcium by mouth 2 (two) times daily with a meal.    [provider]  carvedilol (COREG) 6.25 MG tablet TAKE ONE-HALF TABLET BY MOUTH  EVERY  MORNING AND 1 TABLET BY  MOUTH EVERY EVENING 05/29/23   [name], MD  celecoxib (CELEBREX) 200 MG capsule TAKE 1 CAPSULE BY MOUTH DAILY  WITH FOOD 06/27/23   [name], MD  cloNIDine (CATAPRES) 0.2 MG tablet TAKE 1 TABLET BY MOUTH TWICE  DAILY FOR BLOOD PRESSURE 07/26/23   [name], MD  Colchicine 0.6 MG CAPS Take 1 capsule by mouth daily. Patient taking differently: Take 1 capsule by mouth as needed (gout flareups). 03/23/22   [name], MD  DULoxetine (CYMBALTA) 30 MG capsule TAKE 1 CAPSULE BY MOUTH TWICE  DAILY Patient taking differently: Take 30 mg by mouth daily. 01/04/23   [name], MD  ferrous sulfate 325 (65 FE) MG tablet Take 325 mg by mouth in the morning.    [provider]  fluocinolone (SYNALAR) 0.01 % external solution Place 3-5 drops in each ear canal twice daily Patient taking differently: 3-5 drops 2 (two) times daily as needed (irritation/infection.). Place 3-5 drops in each ear canal twice daily 03/25/22   [name], MD  furosemide (LASIX) 40 MG tablet TAKE 1 TABLET BY MOUTH IN THE  MORNING 07/26/23   [name], MD  glimepiride (AMARYL) 2 MG tablet Take 1 tablet (2 mg total) by mouth daily before breakfast. 08/01/23   [name], MD  hydroxychloroquine (PLAQUENIL) 200 MG tablet TAKE 1 TABLET BY MOUTH IN THE  MORNING AND AT BEDTIME 07/26/23  [name], MD  Multiple Vitamin (MULTIVITAMIN WITH MINERALS) TABS tablet Take 1 tablet by mouth in the morning.    [provider]  pioglitazone (ACTOS) 30 MG tablet Take 1 tablet (30 mg total) by mouth daily. 08/01/23   [name], MD  potassium chloride SA (KLOR-CON M) 20 MEQ tablet TAKE 1 TABLET BY MOUTH DAILY FOR POTASSIUM REPLACEMENT/  SUPPLEMENT 04/05/23   [name], MD  sildenafil (REVATIO) 20 MG tablet Take 2-5 pills at once, orally, with each sexual encounter 08/04/21   [name], MD  silver sulfADIAZINE (SILVADENE) 1 % cream Apply topically as needed. 11/03/22   [provider]  sulfamethoxazole-trimethoprim (BACTRIM DS) 800-160 MG tablet Take 1 tablet by mouth 2 (two) times daily for 7 days. 08/09/23 08/16/23  Sloan Leiter, DO  tamsulosin (FLOMAX) 0.4 MG CAPS capsule Take 1 capsule (0.4 mg total) by mouth at bedtime. 04/05/23   [name], MD  vitamin B-12 (CYANOCOBALAMIN) 500 MCG tablet Take 500 mcg by mouth in the morning.    [provider]      Allergies    Ciprofloxacin, Definity [perflutren lipid microsphere], and Levofloxacin [levofloxacin]    Review of Systems   Review of Systems  Skin:  Positive for color change and wound.  All other systems reviewed and are negative.   Physical Exam Updated Vital Signs BP (!) 127/56 (BP Location: Left Arm)   Pulse 65   Temp 98.8 F (37.1 C) (Oral)   Resp 17   Ht 6\' 1"  (1.854 m)   Wt (!) 136.5 kg   SpO2 98%   BMI 39.71 kg/m  Physical Exam Vitals and nursing note reviewed.  Constitutional:      Appearance: Normal appearance.  HENT:     Head: Normocephalic and atraumatic.  Eyes:     Conjunctiva/sclera: Conjunctivae normal.  Cardiovascular:     Rate and Rhythm: Normal rate and regular rhythm.  Pulmonary:     Effort: Pulmonary effort is normal. No respiratory distress.     Breath sounds: Normal breath sounds.  Abdominal:     General: There is no distension.     Palpations: Abdomen is soft.     Tenderness: There is no abdominal tenderness.  Musculoskeletal:     Comments: Compartments of the right arm soft, no cellulitic extension to the joints, normal range of motion of the joints  Skin:    General: Skin is warm and dry.     Comments: Area of erythema, edema, and fluctuance to the right ventral forearm, with active purulent drainage (as imaged below)  Neurological:     General: No focal deficit present.     Mental Status: He is alert.  Psychiatric:        Mood and Affect: Mood normal.        Behavior: Behavior normal.      ED Results / Procedures / Treatments    Labs (all labs ordered are listed, but only abnormal results are displayed) Labs Reviewed  CBC - Abnormal; Notable for the following components:      Result Value   RBC 3.79 (*)    Hemoglobin 12.5 (*)    HCT 37.2 (*)    Platelets 147 (*)    All other components within normal limits  BASIC METABOLIC PANEL - Abnormal; Notable for the following components:   BUN 35 (*)    Creatinine, Ser 1.40 (*)    Calcium 8.2 (*)    GFR, Estimated 54 (*)  All other components within normal limits  CULTURE, BLOOD (ROUTINE X 2)  CULTURE, BLOOD (ROUTINE X 2)    EKG None  Radiology No results found.  Procedures Procedures    Medications Ordered in ED Medications  vancomycin (VANCOCIN) IVPB 1000 mg/200 mL premix (1,000 mg Intravenous New Bag/Given 08/11/23 1735)    ED Course/ Medical Decision Making/ A&P                                 Medical Decision Making Amount and/or Complexity of Data Reviewed Labs: ordered.  Risk Prescription drug management. Decision regarding hospitalization.   This patient is a 72 y.o. male  who presents to the ED for concern of skin infection.   Differential diagnoses prior to evaluation: The emergent differential diagnosis includes, but is not limited to,  cellulitis, sepsis, necrotizing fasciitis. This is not an exhaustive differential.   Past Medical History / Co-morbidities / Social History: neuropathy, COPD, gallstones, rheumatoid arthritis, sleep apnea, nonischemic cardiomyopathy, hypertension, chronic kidney disease, CHF with Watchman device  Additional history: Chart reviewed. Pertinent results include: Reviewed ER note from 8/14, patient had reassuring laboratory evaluation, no evidence of osteomyelitis on x-ray, wound culture was obtained and grew out Staph aureus, was discharged with Bactrim.  Physical Exam: Physical exam performed. The pertinent findings include: Normal vital signs, no acute distress.  Surrounding erythema, edema,  fluctuance to the right ventral forearm with active purulent drainage.  Compartment soft.  Lab Tests/Imaging studies: I personally interpreted labs/imaging and the pertinent results include: CBC and BMP grossly at baseline.  Blood cultures obtained.  Medications: I ordered medication including vancomycin.  I have reviewed the patients home medicines and have made adjustments as needed.  Consultations obtained: I consulted with hospitalist Dr. Randol Kern who will admit. He recommends consulting with hand surgery for where patient should be admitted for debridement.  I consulted with hand surgeon Dr Kerry Fort who recommends admission to [facility], asks to be paged upon patient arrival.    Disposition: After consideration of the diagnostic results and the patients response to treatment, I feel that patient is requiring admission for purulent cellulitis failed outpatient antibiotics, requiring IV antibiotics and drainage/debridement.   Final Clinical Impression(s) / ED Diagnoses Final diagnoses:  Cellulitis of right upper extremity    Rx / DC Orders ED Discharge Orders     None      Portions of this report may have been transcribed using voice recognition software. Every effort was made to ensure accuracy; however, inadvertent computerized transcription errors may be present.    Jeanella Flattery 08/11/23 1815    Bethann Berkshire, MD 08/12/23 1149

## 2023-08-11 NOTE — ED Notes (Signed)
ED TO INPATIENT HANDOFF REPORT  ED Nurse Name and Phone #: Gala Romney [phone]  S Name/Age/Gender Mitchell Rod Sr. 71 y.o. male Room/Bed: APA09/APA09  Code Status   Code Status: Prior  Home/SNF/Other Home Patient oriented to: self, place, situation, time Is this baseline? Yes   Triage Complete: Triage complete  Chief Complaint Wound infection [T14.8XXA, L08.9]  Triage Note Pt presents with spider bite to right forearm. Was seen here for same 2 days ago. Was placed on antibiotics but swelling and redness are betting bigger and more drainage.    Allergies Allergies  Allergen Reactions   Ciprofloxacin Hives   Definity [Perflutren Lipid Microsphere] Nausea And Vomiting    During ECHO with Definity injection pt became nauseated and vomited    Levofloxacin [Levofloxacin] Hives    Level of Care/Admitting Diagnosis ED Disposition     ED Disposition  Admit   Condition  --   Comment  Hospital Area: [facility] [[id number]]  Level of Care: Med-Surg [16]  May admit patient to [facility] or [facility] if equivalent level of care is available:: No  Covid Evaluation: Asymptomatic - no recent exposure (last 10 days) testing not required  Diagnosis: Wound infection [[id number]]  Admitting Physician: Chiquita Loth  Attending Physician: Starleen Arms [[id number]]  Certification:: I certify this patient will need inpatient services for at least 2 midnights  Expected Medical Readiness: 08/14/2023          B Medical/Surgery History Past Medical History:  Diagnosis Date   Chronic diarrhea    CKD (chronic kidney disease) stage 3, GFR 30-59 ml/min (HCC)    COPD (chronic obstructive pulmonary disease) (HCC)    Dysrhythmia 02/2022   paroxsymal SVT & aflutter 02/2022 XioXT   Essential hypertension    Gallstones    Gout    History of CHF (congestive heart failure)    History of colonic polyps    History of DVT (deep vein thrombosis) 04/19/2012   BLE DVT  04/09/12   Iron deficiency 02/16/2023   Kidney stones    Lumbar disc disease    Neuropathy    Nonischemic cardiomyopathy (HCC) 2003   Minor coronary atherosclerosis at cardiac catheterization 2003, LVEF initially 20% with normalization   Obesity    Orthostatic hypotension    Presence of Watchman left atrial appendage closure device 02/02/2023   27mm Watchman placed by Dr. Excell Seltzer   RA (rheumatoid arthritis) [facility])    Sleep apnea    Subarachnoid hemorrhage (HCC) 02/03/2022   Traumatic   Subdural hematoma (HCC) 02/03/2022   Traumatic   Syncope, vasovagal    Type 2 diabetes mellitus [facility])    Past Surgical History:  Procedure Laterality Date   BARIATRIC SURGERY  01/11/2018   BURR HOLE Right 05/04/2022   Procedure: BURR HOLES FOR Subdural Hematoma;  Surgeon: Bedelia Person, MD;  Location: [facility] OR;  Service: Neurosurgery;  Laterality: Right;   CHOLECYSTECTOMY  01/18/2012   Procedure: LAPAROSCOPIC CHOLECYSTECTOMY WITH INTRAOPERATIVE CHOLANGIOGRAM;  Surgeon: Clovis Pu. Cornett, MD;  Location: [facility] ORS;  Service: General;  Laterality: N/A;   COLONOSCOPY  05/2017   [facility]: five polyps removed but only 3 retrieved due to bowel prep, pathology with tubular adenomas   EYE SURGERY Right    Film over retina was removed.   LEFT ATRIAL APPENDAGE OCCLUSION N/A 02/02/2023   Procedure: LEFT ATRIAL APPENDAGE OCCLUSION;  Surgeon: Tonny Bollman, MD;  Location: [facility] INVASIVE CV LAB;  Service: Cardiovascular;  Laterality: N/A;  PILONIDAL CYST EXCISION  1997   TEE WITHOUT CARDIOVERSION N/A 02/02/2023   Procedure: TRANSESOPHAGEAL ECHOCARDIOGRAM;  Surgeon: Tonny Bollman, MD;  Location: [facility] INVASIVE CV LAB;  Service: Cardiovascular;  Laterality: N/A;   TOE AMPUTATION Right    3 toes on right foot- big toe and next 2   UMBILICAL HERNIA REPAIR     VARICOSE VEIN SURGERY     left leg   watchman implant       A IV Location/Drains/Wounds Patient Lines/Drains/Airways Status     Active  Line/Drains/Airways     Name Placement date Placement time Site Days   Peripheral IV 08/11/23 20 G 1" Left;Posterior Wrist 08/11/23  1720  Wrist  less than 1            Intake/Output Last 24 hours No intake or output data in the 24 hours ending 08/11/23 1854  Labs/Imaging Results for orders placed or performed during the hospital encounter of 08/11/23 (from the past 48 hour(s))  CBC     Status: Abnormal   Collection Time: 08/11/23  4:49 PM  Result Value Ref Range   WBC 4.9 4.0 - 10.5 K/uL   RBC 3.79 (L) 4.22 - 5.81 MIL/uL   Hemoglobin 12.5 (L) 13.0 - 17.0 g/dL   HCT 63.8 (L) 75.6 - 43.3 %   MCV 98.2 80.0 - 100.0 fL   MCH 33.0 26.0 - 34.0 pg   MCHC 33.6 30.0 - 36.0 g/dL   RDW 29.5 18.8 - 41.6 %   Platelets 147 (L) 150 - 400 K/uL   nRBC 0.0 0.0 - 0.2 %    Comment: Performed at [facility], [address], [place], [place] [address]  Basic metabolic panel     Status: Abnormal   Collection Time: 08/11/23  4:49 PM  Result Value Ref Range   Sodium 136 135 - 145 mmol/L   Potassium 4.4 3.5 - 5.1 mmol/L   Chloride 102 98 - 111 mmol/L   CO2 22 22 - 32 mmol/L   Glucose, Bld 90 70 - 99 mg/dL    Comment: Glucose reference range applies only to samples taken after fasting for at least 8 hours.   BUN 35 (H) 8 - 23 mg/dL   Creatinine, Ser 1.60 (H) 0.61 - 1.24 mg/dL   Calcium 8.2 (L) 8.9 - 10.3 mg/dL   GFR, Estimated 54 (L) >60 mL/min    Comment: (NOTE) Calculated using the CKD-EPI Creatinine Equation (2021)    Anion gap 12 5 - 15    Comment: Performed at [facility], [address] [place], [place] [address]  Blood culture (routine x 2)     Status: None (Preliminary result)   Collection Time: 08/11/23  4:49 PM   Specimen: BLOOD  Result Value Ref Range   Specimen Description BLOOD BLOOD LEFT HAND    Special Requests      BOTTLES DRAWN AEROBIC AND ANAEROBIC Blood Culture results may not be optimal due to an excessive volume of blood received in culture bottles Performed at [facility], [address], [place], [place] [address]    Culture PENDING    Report Status PENDING   Blood culture (routine x 2)     Status: None (Preliminary result)   Collection Time: 08/11/23  4:49 PM   Specimen: BLOOD  Result Value Ref Range   Specimen Description BLOOD BLOOD RIGHT HAND    Special Requests      BOTTLES DRAWN AEROBIC AND ANAEROBIC Blood Culture results may not be  optimal due to an excessive volume of blood received in culture bottles Performed at [facility], [address], [place], [place] [address]    Culture PENDING    Report Status PENDING    No results found.  Pending Labs Unresulted Labs (From admission, onward)    None       Vitals/Pain Today's Vitals   08/11/23 1615 08/11/23 1615 08/11/23 1739  BP:  (!) 127/56   Pulse:  65   Resp:  17   Temp:  98.8 F (37.1 C)   TempSrc:  Oral   SpO2:  98%   Weight: (!) 136.5 kg    Height: 6\' 1"  (1.854 m)    PainSc: 8   7     Isolation Precautions No active isolations  Medications Medications  vancomycin (VANCOCIN) IVPB 1000 mg/200 mL premix (1,000 mg Intravenous New Bag/Given 08/11/23 1735)    Mobility walks     Focused Assessments    R Recommendations: See Admitting Provider Note  Report given to: Lela

## 2023-08-11 NOTE — H&P (Signed)
TRH H&P   Patient Demographics:    Alfons Ramamurthy, is a 72 y.o. male  MRN: [id number]   DOB - 1951/05/12  Admit Date - 08/11/2023  Outpatient Primary MD for the patient is [name], MD  Referring MD/NP/PA: PA Lorin  Patient coming from: home  Chief Complaint  Patient presents with   Insect Bite      HPI:    Carlyle Wilner  is a 72 y.o. male, with past medical history of diabetes mellitus, neuropathy, COPD, rheumatoid arthritis, sleep apnea, nonischemic cardiomyopathy, hypertension, CKD, CHF, A-fib status post Watchman procedure last February (finished his Eliquis then Plavix last dose on Friday) -Patient presents to ED secondary to the worsening site of insect bite, patient initially with right forearm insect bite, he thinks is a spider as he was working in his yard, and he was cleaning some spider webs, he presented to ED initially 8/14 not improving on doxycycline given by his PCP, to ED today as he did not improve after switching his doxycycline to Bactrim, he reports worsening redness, pain, swelling, and started to have drainage of pus, he denies fever, chills, he was compliant with his antibiotics. -ED his workup significant for creatinine of 1.4, he was afebrile, no leukocytosis, he has significant wound with purulent drainage, ED discussed with hand surgery at [facility] who recommended admitted to [facility] for possible need for I&D, patient was started on broad-spectrum antibiotic and [facility] hospitalist consulted to admit.    Review of systems:      A full 10 point Review of Systems was done, except as stated above, all other Review of Systems were negative.   With Past History of the following :    Past Medical History:  Diagnosis Date   Chronic diarrhea    CKD (chronic kidney disease) stage 3, GFR 30-59 ml/min (HCC)    COPD (chronic obstructive pulmonary  disease) (HCC)    Dysrhythmia 02/2022   paroxsymal SVT & aflutter 02/2022 XioXT   Essential hypertension    Gallstones    Gout    History of CHF (congestive heart failure)    History of colonic polyps    History of DVT (deep vein thrombosis) 04/19/2012   BLE DVT 04/09/12   Iron deficiency 02/16/2023   Kidney stones    Lumbar disc disease    Neuropathy    Nonischemic cardiomyopathy (HCC) 2003   Minor coronary atherosclerosis at cardiac catheterization 2003, LVEF initially 20% with normalization   Obesity    Orthostatic hypotension    Presence of Watchman left atrial appendage closure device 02/02/2023   27mm Watchman placed by Dr. Excell Seltzer   RA (rheumatoid arthritis) [facility])    Sleep apnea    Subarachnoid hemorrhage (HCC) 02/03/2022   Traumatic   Subdural hematoma (HCC) 02/03/2022   Traumatic   Syncope, vasovagal    Type 2 diabetes mellitus (HCC)  Past Surgical History:  Procedure Laterality Date   BARIATRIC SURGERY  01/11/2018   BURR HOLE Right 05/04/2022   Procedure: BURR HOLES FOR Subdural Hematoma;  Surgeon: Bedelia Person, MD;  Location: [facility] OR;  Service: Neurosurgery;  Laterality: Right;   CHOLECYSTECTOMY  01/18/2012   Procedure: LAPAROSCOPIC CHOLECYSTECTOMY WITH INTRAOPERATIVE CHOLANGIOGRAM;  Surgeon: Clovis Pu. Cornett, MD;  Location: [facility] ORS;  Service: General;  Laterality: N/A;   COLONOSCOPY  05/2017   [facility]: five polyps removed but only 3 retrieved due to bowel prep, pathology with tubular adenomas   EYE SURGERY Right    Film over retina was removed.   LEFT ATRIAL APPENDAGE OCCLUSION N/A 02/02/2023   Procedure: LEFT ATRIAL APPENDAGE OCCLUSION;  Surgeon: Tonny Bollman, MD;  Location: [facility] INVASIVE CV LAB;  Service: Cardiovascular;  Laterality: N/A;   PILONIDAL CYST EXCISION  1997   TEE WITHOUT CARDIOVERSION N/A 02/02/2023   Procedure: TRANSESOPHAGEAL ECHOCARDIOGRAM;  Surgeon: Tonny Bollman, MD;  Location: [facility] INVASIVE CV LAB;  Service:  Cardiovascular;  Laterality: N/A;   TOE AMPUTATION Right    3 toes on right foot- big toe and next 2   UMBILICAL HERNIA REPAIR     VARICOSE VEIN SURGERY     left leg   watchman implant        Social History:     Social History   Tobacco Use   Smoking status: Former    Current packs/day: 0.00    Average packs/day: 2.0 packs/day for 25.0 years (50.0 ttl pk-yrs)    Types: Cigarettes    Start date: 12/26/1969    Quit date: 12/26/1994    Years since quitting: 28.6   Smokeless tobacco: Never  Substance Use Topics   Alcohol use: No        Family History :     Family History  Problem Relation Age of Onset   Hypertension Mother    Stroke Mother    Diabetes Father    Hypertension Father    Heart disease Father    Diabetes Brother    Colon cancer Neg Hx      Home Medications:   Prior to Admission medications   Medication Sig Start Date End Date Taking? Authorizing Provider  acetaminophen (TYLENOL) 500 MG tablet Take 500 mg by mouth every 6 (six) hours as needed for mild pain.   Yes [provider]  allopurinol (ZYLOPRIM) 300 MG tablet Take 1 tablet (300 mg total) by mouth every morning. 04/05/23  Yes Stacks, Broadus John, MD  amLODipine-olmesartan (AZOR) 10-40 MG tablet Take 1 tablet by mouth daily. 04/05/23  Yes Stacks, Broadus John, MD  atorvastatin (LIPITOR) 80 MG tablet TAKE 1 TABLET BY MOUTH DAILY AT  6 PM 04/05/23  Yes [name], MD  calcitRIOL (ROCALTROL) 0.25 MCG capsule Take 1 capsule (0.25 mcg total) by mouth daily with breakfast. 04/05/23  Yes [name], MD  calcium carbonate (OSCAL) 1500 (600 Ca) MG TABS tablet Take 600 mg of elemental calcium by mouth 2 (two) times daily with a meal.   Yes [provider]  carvedilol (COREG) 6.25 MG tablet TAKE ONE-HALF TABLET BY MOUTH  EVERY MORNING AND 1 TABLET BY  MOUTH EVERY EVENING 05/29/23  Yes Stacks, Broadus John, MD  celecoxib (CELEBREX) 200 MG capsule TAKE 1 CAPSULE BY MOUTH DAILY  WITH FOOD 06/27/23  Yes Stacks,  Broadus John, MD  cloNIDine (CATAPRES) 0.2 MG tablet TAKE 1 TABLET BY MOUTH TWICE  DAILY FOR BLOOD PRESSURE 07/26/23  Yes [name], MD  Colchicine 0.6  MG CAPS Take 1 capsule by mouth daily. Patient taking differently: Take 1 capsule by mouth as needed (gout flareups). 03/23/22  Yes Stacks, Broadus John, MD  doxycycline (VIBRA-TABS) 100 MG tablet Take 100 mg by mouth 2 (two) times daily. 08/07/23  Yes [provider]  DULoxetine (CYMBALTA) 30 MG capsule TAKE 1 CAPSULE BY MOUTH TWICE  DAILY 01/04/23  Yes [name], MD  ferrous sulfate 325 (65 FE) MG tablet Take 325 mg by mouth in the morning.   Yes [provider]  fluocinolone (SYNALAR) 0.01 % external solution Place 3-5 drops in each ear canal twice daily Patient taking differently: 3-5 drops 2 (two) times daily as needed (irritation/infection.). Place 3-5 drops in each ear canal twice daily 03/25/22  Yes Stacks, Broadus John, MD  furosemide (LASIX) 40 MG tablet TAKE 1 TABLET BY MOUTH IN THE  MORNING 07/26/23  Yes Stacks, Broadus John, MD  glimepiride (AMARYL) 2 MG tablet Take 1 tablet (2 mg total) by mouth daily before breakfast. 08/01/23  Yes Stacks, Broadus John, MD  hydroxychloroquine (PLAQUENIL) 200 MG tablet TAKE 1 TABLET BY MOUTH IN THE  MORNING AND AT BEDTIME 07/26/23  Yes [name], MD  Multiple Vitamin (MULTIVITAMIN WITH MINERALS) TABS tablet Take 1 tablet by mouth in the morning.   Yes [provider]  pioglitazone (ACTOS) 30 MG tablet Take 1 tablet (30 mg total) by mouth daily. 08/01/23  Yes Stacks, Broadus John, MD  potassium chloride SA (KLOR-CON M) 20 MEQ tablet TAKE 1 TABLET BY MOUTH DAILY FOR POTASSIUM REPLACEMENT/  SUPPLEMENT 04/05/23  Yes Stacks, Broadus John, MD  silver sulfADIAZINE (SILVADENE) 1 % cream Apply topically as needed. 11/03/22  Yes [provider]  sulfamethoxazole-trimethoprim (BACTRIM DS) 800-160 MG tablet Take 1 tablet by mouth 2 (two) times daily for 7 days. 08/09/23 08/16/23 Yes Tanda Rockers A, DO  tamsulosin  (FLOMAX) 0.4 MG CAPS capsule Take 1 capsule (0.4 mg total) by mouth at bedtime. 04/05/23  Yes Stacks, Broadus John, MD  vitamin B-12 (CYANOCOBALAMIN) 500 MCG tablet Take 500 mcg by mouth in the morning.   Yes [provider]     Allergies:     Allergies  Allergen Reactions   Ciprofloxacin Hives   Definity [Perflutren Lipid Microsphere] Nausea And Vomiting    During ECHO with Definity injection pt became nauseated and vomited    Levofloxacin [Levofloxacin] Hives     Physical Exam:   Vitals  Blood pressure (!) 145/54, pulse 60, temperature 98 F (36.7 C), temperature source Oral, resp. rate 18, height 6\' 1"  (1.854 m), weight (!) 136.5 kg, SpO2 100%.   1. General This male, laying in bed, no apparent distress  2. Normal affect and insight, Not Suicidal or Homicidal, Awake Alert, Oriented X 3.  3. No F.N deficits, ALL C.Nerves Intact, Strength 5/5 all 4 extremities, Sensation intact all 4 extremities, Plantars down going.  4. Ears and Eyes appear Normal, Conjunctivae clear, PERRLA. Moist Oral Mucosa.  5. Supple Neck, No JVD, No cervical lymphadenopathy appriciated, No Carotid Bruits.  6. Symmetrical Chest wall movement, Good air movement bilaterally, CTAB.  7. RRR, No Gallops, Rubs or Murmurs, No Parasternal Heave.  8. Positive Bowel Sounds, Abdomen Soft, No tenderness, No organomegaly appriciated,No rebound -guarding or rigidity.  9.  No Cyanosis, Normal Skin Turgor, lower extremity skin changes, with trace edema, right arm with surrounding erythema, and purulent discharge, please see pictures below  10. Good muscle tone,  joints appear normal , no effusions, Normal ROM.     Data Review:  CBC Recent Labs  Lab 08/09/23 1038 08/11/23 1233 08/11/23 1649  WBC 6.0 4.9 4.9  HGB 12.8* 12.7* 12.5*  HCT 37.9* 38.1* 37.2*  PLT 155 146* 147*  MCV 98.4 98.7 98.2  MCH 33.2 32.9 33.0  MCHC 33.8 33.3 33.6  RDW 13.5 13.4 13.4  LYMPHSABS 0.7 0.7  --   MONOABS 0.5 0.4  --    EOSABS 0.3 0.2  --   BASOSABS 0.0 0.0  --    ------------------------------------------------------------------------------------------------------------------  Chemistries  Recent Labs  Lab 08/09/23 1038 08/11/23 1233 08/11/23 1649  NA 135 136 136  K 4.2 4.5 4.4  CL 105 104 102  CO2 26 20* 22  GLUCOSE 125* 123* 90  BUN 28* 35* 35*  CREATININE 1.22 1.28* 1.40*  CALCIUM 8.6* 8.2* 8.2*  AST 21 30  --   ALT 23 30  --   ALKPHOS 55 57  --   BILITOT 0.5 0.3  --    ------------------------------------------------------------------------------------------------------------------ estimated creatinine clearance is 71.2 mL/min (A) (by C-G formula based on SCr of 1.4 mg/dL (H)). ------------------------------------------------------------------------------------------------------------------ No results for input(s): "TSH", "T4TOTAL", "T3FREE", "THYROIDAB" in the last 72 hours.  Invalid input(s): "FREET3"  Coagulation profile No results for input(s): "INR", "PROTIME" in the last 168 hours. ------------------------------------------------------------------------------------------------------------------- No results for input(s): "DDIMER" in the last 72 hours. -------------------------------------------------------------------------------------------------------------------  Cardiac Enzymes No results for input(s): "CKMB", "TROPONINI", "MYOGLOBIN" in the last 168 hours.  Invalid input(s): "CK" ------------------------------------------------------------------------------------------------------------------    Component Value Date/Time   BNP 59.4 02/10/2022 1647     ---------------------------------------------------------------------------------------------------------------  Urinalysis    Component Value Date/Time   COLORURINE YELLOW 08/09/2023 1050   APPEARANCEUR CLEAR 08/09/2023 1050   APPEARANCEUR Clear 10/08/2021 1421   LABSPEC 1.015 08/09/2023 1050   PHURINE 5.0  08/09/2023 1050   GLUCOSEU NEGATIVE 08/09/2023 1050   HGBUR NEGATIVE 08/09/2023 1050   BILIRUBINUR NEGATIVE 08/09/2023 1050   BILIRUBINUR Negative 10/08/2021 1421   KETONESUR NEGATIVE 08/09/2023 1050   PROTEINUR NEGATIVE 08/09/2023 1050   UROBILINOGEN 0.2 10/08/2012 1053   NITRITE NEGATIVE 08/09/2023 1050   LEUKOCYTESUR NEGATIVE 08/09/2023 1050    ----------------------------------------------------------------------------------------------------------------   Imaging Results:    No results found.    Assessment & Plan:    Principal Problem:   Wound infection Active Problems:   Obesity, morbid (HCC)   CHF (congestive heart failure) (HCC)   COPD (chronic obstructive pulmonary disease) (HCC)   OSA on CPAP   Chronic renal insufficiency   Chronic diastolic congestive heart failure (HCC)   Venous insufficiency of right leg   SDH (subdural hematoma) (HCC)   Atrial fibrillation (HCC)  Right arm wound infection at spider bite site -Continues to have worsening wound infection despite being treated with p.o. antibiotics, significantly tender, has purulent discharge, will need evaluation by hand surgery, and incision and drainage, he will be admitted to [facility] for evaluation Dr Brandt Loosen -Continue with broad-spectrum antibiotics vancomycin, Rocephin and Flagyl   Paroxysmal A-fib -Not on anticoagulation anymore status post Watchman procedures -He finished his apixaban 6 weeks postprocedure, and he just finished his Plavix 75 mg daily for 6 months last week  Diabetes mellitus type 2 with neuropathy -Continue duloxetine and gabapentin -Hold Actos and glimepiride, will keep an insulin sliding scale   Rheumatoid arthritis -Continue Plaquenil   Hyperlipidemia -Continue statin   Thrombocytopenia -Chronic, at baseline  BPH - continue with flomax  CKd stage IIIb -renal function at baseline   History of subarachnoid hemorrhage and Subdural Hematoma -Has been in  the  past secondary to multiple falls, he is not on anticoagulation anymore he denies any recent falls  Hypertension -continue with home medication Coreg, clonidine and Azor  Obesity Body mass index is 39.71 kg/m.  AM Labs Ordered, also please review Full Orders  Family Communication: Admission, patients condition and plan of care including tests being ordered have been discussed with the patient and wife who indicate understanding and agree with the plan and Code Status.  Code Status full  Likely DC to  home  Condition GUARDED    Consults called: hand surgery Dr Nicolette Bang    Admission status: inpatient    Time spent in minutes : 70 minutes   Huey Bienenstock M.D on 08/11/2023 at 8:06 PM   [facility] - Office  [phone]

## 2023-08-12 ENCOUNTER — Encounter (HOSPITAL_COMMUNITY): Payer: Self-pay | Admitting: Internal Medicine

## 2023-08-12 ENCOUNTER — Inpatient Hospital Stay (HOSPITAL_COMMUNITY): Payer: No Typology Code available for payment source | Admitting: Anesthesiology

## 2023-08-12 ENCOUNTER — Encounter (HOSPITAL_COMMUNITY)
Admission: EM | Disposition: A | Discharge status: Home / Self Care | Payer: Self-pay | Source: Home / Self Care | Attending: Family Medicine

## 2023-08-12 DIAGNOSIS — I5032 Chronic diastolic (congestive) heart failure: Secondary | ICD-10-CM

## 2023-08-12 DIAGNOSIS — M65031 Abscess of tendon sheath, right forearm: Secondary | ICD-10-CM

## 2023-08-12 DIAGNOSIS — Z87891 Personal history of nicotine dependence: Secondary | ICD-10-CM

## 2023-08-12 DIAGNOSIS — N183 Chronic kidney disease, stage 3 unspecified: Secondary | ICD-10-CM

## 2023-08-12 DIAGNOSIS — I130 Hypertensive heart and chronic kidney disease with heart failure and stage 1 through stage 4 chronic kidney disease, or unspecified chronic kidney disease: Secondary | ICD-10-CM

## 2023-08-12 DIAGNOSIS — L089 Local infection of the skin and subcutaneous tissue, unspecified: Secondary | ICD-10-CM | POA: Diagnosis not present

## 2023-08-12 DIAGNOSIS — T148XXA Other injury of unspecified body region, initial encounter: Secondary | ICD-10-CM | POA: Diagnosis not present

## 2023-08-12 HISTORY — PX: I & D EXTREMITY: SHX5045

## 2023-08-12 LAB — HEMOGLOBIN A1C
Hgb A1c MFr Bld: 6.1 % — ABNORMAL HIGH (ref 4.8–5.6)
Mean Plasma Glucose: 128 mg/dL

## 2023-08-12 LAB — AEROBIC CULTURE W GRAM STAIN (SUPERFICIAL SPECIMEN): Gram Stain: NONE SEEN

## 2023-08-12 LAB — BASIC METABOLIC PANEL
Anion gap: 11 (ref 5–15)
BUN: 28 mg/dL — ABNORMAL HIGH (ref 8–23)
CO2: 21 mmol/L — ABNORMAL LOW (ref 22–32)
Calcium: 8.1 mg/dL — ABNORMAL LOW (ref 8.9–10.3)
Chloride: 104 mmol/L (ref 98–111)
Creatinine, Ser: 1.24 mg/dL (ref 0.61–1.24)
GFR, Estimated: >60 mL/min (ref >60)
Glucose, Bld: 91 mg/dL (ref 70–99)
Potassium: 3.7 mmol/L (ref 3.5–5.1)
Sodium: 136 mmol/L (ref 135–145)

## 2023-08-12 LAB — HIV ANTIBODY (ROUTINE TESTING W REFLEX): HIV Screen 4th Generation wRfx: NONREACTIVE

## 2023-08-12 LAB — GLUCOSE, CAPILLARY
Glucose-Capillary: 116 mg/dL — ABNORMAL HIGH (ref 70–99)
Glucose-Capillary: 116 mg/dL — ABNORMAL HIGH (ref 70–99)
Glucose-Capillary: 105 mg/dL — ABNORMAL HIGH (ref 70–99)
Glucose-Capillary: 106 mg/dL — ABNORMAL HIGH (ref 70–99)

## 2023-08-12 LAB — CBC
HCT: 37 % — ABNORMAL LOW (ref 39.0–52.0)
Hemoglobin: 12.4 g/dL — ABNORMAL LOW (ref 13.0–17.0)
MCH: 32.9 pg (ref 26.0–34.0)
MCHC: 33.5 g/dL (ref 30.0–36.0)
MCV: 98.1 fL (ref 80.0–100.0)
Platelets: 142 10*3/uL — ABNORMAL LOW (ref 150–400)
RBC: 3.77 MIL/uL — ABNORMAL LOW (ref 4.22–5.81)
RDW: 13.4 % (ref 11.5–15.5)
WBC: 4.7 10*3/uL (ref 4.0–10.5)
nRBC: 0 % (ref 0.0–0.2)

## 2023-08-12 LAB — C-REACTIVE PROTEIN: CRP: 0.8 mg/dL (ref <1.0)

## 2023-08-12 SURGERY — IRRIGATION AND DEBRIDEMENT EXTREMITY
Anesthesia: General | Laterality: Right

## 2023-08-12 MED ORDER — INSULIN ASPART 100 UNIT/ML IJ SOLN: 0.0000 [IU] | Freq: Every day | INTRAMUSCULAR | Status: DC

## 2023-08-12 MED ORDER — INSULIN ASPART 100 UNIT/ML IJ SOLN: 0.0000 [IU] | Freq: Three times a day (TID) | INTRAMUSCULAR | Status: DC

## 2023-08-12 MED ORDER — LACTATED RINGERS IV SOLN: INTRAVENOUS | Status: DC | PRN

## 2023-08-12 MED ORDER — HYDROMORPHONE HCL 1 MG/ML IJ SOLN
0.5000 mg | INTRAMUSCULAR | Status: DC | PRN
  Administered 2023-08-12 (×2): 0.5 mg via INTRAVENOUS

## 2023-08-12 MED ORDER — ACETAMINOPHEN 10 MG/ML IV SOLN: 1000.0000 mg | Freq: Once | INTRAVENOUS | Status: DC | PRN

## 2023-08-12 MED ORDER — SUCCINYLCHOLINE CHLORIDE 200 MG/10ML IV SOSY
PREFILLED_SYRINGE | INTRAVENOUS | Status: AC
  Filled 2023-08-12: qty 10

## 2023-08-12 MED ORDER — ONDANSETRON HCL 4 MG/2ML IJ SOLN
INTRAMUSCULAR | Status: AC
  Filled 2023-08-12: qty 2

## 2023-08-12 MED ORDER — 0.9 % SODIUM CHLORIDE (POUR BTL) OPTIME
TOPICAL | Status: DC | PRN
  Administered 2023-08-12: 1000 mL

## 2023-08-12 MED ORDER — DROPERIDOL 2.5 MG/ML IJ SOLN: 0.6250 mg | Freq: Once | INTRAMUSCULAR | Status: DC | PRN

## 2023-08-12 MED ORDER — OXYCODONE HCL 5 MG PO TABS
ORAL_TABLET | ORAL | Status: AC
  Filled 2023-08-12: qty 1

## 2023-08-12 MED ORDER — LIDOCAINE HCL (CARDIAC) PF 100 MG/5ML IV SOSY
PREFILLED_SYRINGE | INTRAVENOUS | Status: DC | PRN
  Administered 2023-08-12: 100 mg via INTRAVENOUS

## 2023-08-12 MED ORDER — ROCURONIUM BROMIDE 100 MG/10ML IV SOLN
INTRAVENOUS | Status: DC | PRN
  Administered 2023-08-12: 50 mg via INTRAVENOUS

## 2023-08-12 MED ORDER — ONDANSETRON HCL 4 MG/2ML IJ SOLN: 4.0000 mg | Freq: Once | INTRAMUSCULAR | Status: DC | PRN

## 2023-08-12 MED ORDER — FENTANYL CITRATE (PF) 100 MCG/2ML IJ SOLN: 25.0000 ug | INTRAMUSCULAR | Status: DC | PRN

## 2023-08-12 MED ORDER — FENTANYL CITRATE (PF) 100 MCG/2ML IJ SOLN
INTRAMUSCULAR | Status: DC | PRN
  Administered 2023-08-12: 100 ug via INTRAVENOUS
  Administered 2023-08-12: 50 ug via INTRAVENOUS
  Administered 2023-08-12: 100 ug via INTRAVENOUS

## 2023-08-12 MED ORDER — FENTANYL CITRATE (PF) 250 MCG/5ML IJ SOLN
INTRAMUSCULAR | Status: AC
  Filled 2023-08-12: qty 5

## 2023-08-12 MED ORDER — PROPOFOL 10 MG/ML IV BOLUS
INTRAVENOUS | Status: AC
  Filled 2023-08-12: qty 20

## 2023-08-12 MED ORDER — BUPIVACAINE HCL (PF) 0.25 % IJ SOLN
INTRAMUSCULAR | Status: DC | PRN
  Administered 2023-08-12: 10 mL

## 2023-08-12 MED ORDER — BUPIVACAINE HCL (PF) 0.25 % IJ SOLN
INTRAMUSCULAR | Status: AC
  Filled 2023-08-12: qty 30

## 2023-08-12 MED ORDER — SUGAMMADEX SODIUM 200 MG/2ML IV SOLN
INTRAVENOUS | Status: DC | PRN
  Administered 2023-08-12: 300 mg via INTRAVENOUS

## 2023-08-12 MED ORDER — PHENYLEPHRINE HCL-NACL 20-0.9 MG/250ML-% IV SOLN
INTRAVENOUS | Status: DC | PRN
Start: 2023-08-12 — End: 2023-08-12
  Administered 2023-08-12: 40 ug/min via INTRAVENOUS

## 2023-08-12 MED ORDER — PROPOFOL 10 MG/ML IV BOLUS
INTRAVENOUS | Status: DC | PRN
  Administered 2023-08-12: 150 mg via INTRAVENOUS

## 2023-08-12 MED ORDER — SUCCINYLCHOLINE CHLORIDE 200 MG/10ML IV SOSY
PREFILLED_SYRINGE | INTRAVENOUS | Status: DC | PRN
  Administered 2023-08-12: 150 mg via INTRAVENOUS

## 2023-08-12 MED ORDER — LIDOCAINE 2% (20 MG/ML) 5 ML SYRINGE
INTRAMUSCULAR | Status: AC
  Filled 2023-08-12: qty 5

## 2023-08-12 MED ORDER — ONDANSETRON HCL 4 MG/2ML IJ SOLN
INTRAMUSCULAR | Status: DC | PRN
Start: 2023-08-12 — End: 2023-08-12
  Administered 2023-08-12: 4 mg via INTRAVENOUS

## 2023-08-12 MED ORDER — OXYCODONE HCL 5 MG PO TABS
5.0000 mg | ORAL_TABLET | Freq: Once | ORAL | Status: AC | PRN
  Administered 2023-08-12: 5 mg via ORAL

## 2023-08-12 MED ORDER — OXYCODONE HCL 5 MG/5ML PO SOLN: 5.0000 mg | Freq: Once | ORAL | Status: AC | PRN

## 2023-08-12 MED ORDER — SODIUM CHLORIDE 0.9 % IR SOLN
Status: DC | PRN
  Administered 2023-08-12: 3000 mL

## 2023-08-12 MED ORDER — HYDROMORPHONE HCL 1 MG/ML IJ SOLN
INTRAMUSCULAR | Status: AC
  Filled 2023-08-12: qty 1

## 2023-08-12 SURGICAL SUPPLY — 56 items
BAG COUNTER SPONGE SURGICOUNT (BAG) ×1 IMPLANT
BAG SPNG CNTER NS LX DISP (BAG) ×1
BNDG CMPR 5X3 KNIT ELC UNQ LF (GAUZE/BANDAGES/DRESSINGS) ×1
BNDG CMPR 5X4 KNIT ELC UNQ LF (GAUZE/BANDAGES/DRESSINGS) ×1
BNDG CMPR 75X21 PLY HI ABS (MISCELLANEOUS)
BNDG CMPR 9X4 STRL LF SNTH (GAUZE/BANDAGES/DRESSINGS)
BNDG ELASTIC 3INX 5YD STR LF (GAUZE/BANDAGES/DRESSINGS) ×1 IMPLANT
BNDG ELASTIC 4INX 5YD STR LF (GAUZE/BANDAGES/DRESSINGS) IMPLANT
BNDG ELASTIC 4X5.8 VLCR STR LF (GAUZE/BANDAGES/DRESSINGS) ×1 IMPLANT
BNDG ESMARK 4X9 LF (GAUZE/BANDAGES/DRESSINGS) IMPLANT
BNDG GAUZE DERMACEA FLUFF 4 (GAUZE/BANDAGES/DRESSINGS) ×2 IMPLANT
BNDG GZE DERMACEA 4 6PLY (GAUZE/BANDAGES/DRESSINGS) ×1
CORD BIPOLAR FORCEPS 12FT (ELECTRODE) IMPLANT
COVER SURGICAL LIGHT HANDLE (MISCELLANEOUS) ×2 IMPLANT
CUFF TOURN SGL QUICK 18X4 (TOURNIQUET CUFF) ×1 IMPLANT
DRAPE SURG 17X23 STRL (DRAPES) ×1 IMPLANT
DURAPREP 26ML APPLICATOR (WOUND CARE) ×1 IMPLANT
GAUZE PACKING IODOFORM 1/4X15 (PACKING) IMPLANT
GAUZE SPONGE 4X4 12PLY STRL (GAUZE/BANDAGES/DRESSINGS) ×2 IMPLANT
GAUZE SPONGE 4X4 12PLY STRL LF (GAUZE/BANDAGES/DRESSINGS) IMPLANT
GAUZE STRETCH 2X75IN STRL (MISCELLANEOUS) IMPLANT
GAUZE XEROFORM 1X8 LF (GAUZE/BANDAGES/DRESSINGS) ×1 IMPLANT
GLOVE BIOGEL PI IND STRL 6.5 (GLOVE) IMPLANT
GLOVE SURG SYN 8.0 (GLOVE) ×1 IMPLANT
GLOVE SURG SYN 8.0 PF PI (GLOVE) ×1 IMPLANT
GOWN STRL REUS W/ TWL LRG LVL3 (GOWN DISPOSABLE) ×1 IMPLANT
GOWN STRL REUS W/ TWL XL LVL3 (GOWN DISPOSABLE) ×1 IMPLANT
GOWN STRL REUS W/TWL LRG LVL3 (GOWN DISPOSABLE) ×1
GOWN STRL REUS W/TWL XL LVL3 (GOWN DISPOSABLE) ×1
GOWN STRL SURGICAL XL XLNG (GOWN DISPOSABLE) IMPLANT
KIT BASIN OR (CUSTOM PROCEDURE TRAY) ×1 IMPLANT
KIT TURNOVER KIT B (KITS) ×1 IMPLANT
MANIFOLD NEPTUNE II (INSTRUMENTS) ×1 IMPLANT
NDL HYPO 25GX1X1/2 BEV (NEEDLE) IMPLANT
NDL HYPO 25X1 1.5 SAFETY (NEEDLE) ×1 IMPLANT
NEEDLE HYPO 25GX1X1/2 BEV (NEEDLE) IMPLANT
NEEDLE HYPO 25X1 1.5 SAFETY (NEEDLE) ×1 IMPLANT
NS IRRIG 1000ML POUR BTL (IV SOLUTION) ×1 IMPLANT
PACK ORTHO EXTREMITY (CUSTOM PROCEDURE TRAY) ×1 IMPLANT
PAD ARMBOARD 7.5X6 YLW CONV (MISCELLANEOUS) ×2 IMPLANT
PAD CAST 4YDX4 CTTN HI CHSV (CAST SUPPLIES) ×2 IMPLANT
PADDING CAST COTTON 4X4 STRL (CAST SUPPLIES) ×2
SET CYSTO W/LG BORE CLAMP LF (SET/KITS/TRAYS/PACK) IMPLANT
SLING ARM FOAM STRAP XLG (SOFTGOODS) IMPLANT
SPIKE FLUID TRANSFER (MISCELLANEOUS) ×1 IMPLANT
SPONGE T-LAP 18X18 ~~LOC~~+RFID (SPONGE) ×1 IMPLANT
SUT ETHILON 3 0 FSL (SUTURE) IMPLANT
SUT VICRYL RAPIDE 4/0 PS 2 (SUTURE) IMPLANT
SWAB COLLECTION DEVICE MRSA (MISCELLANEOUS) ×1 IMPLANT
SWAB CULTURE ESWAB REG 1ML (MISCELLANEOUS) IMPLANT
SYR CONTROL 10ML LL (SYRINGE) ×1 IMPLANT
TOWEL GREEN STERILE (TOWEL DISPOSABLE) ×1 IMPLANT
TOWEL GREEN STERILE FF (TOWEL DISPOSABLE) ×1 IMPLANT
TUBE CONNECTING 12X1/4 (SUCTIONS) ×1 IMPLANT
UNDERPAD 30X36 HEAVY ABSORB (UNDERPADS AND DIAPERS) ×1 IMPLANT
YANKAUER SUCT BULB TIP NO VENT (SUCTIONS) ×1 IMPLANT

## 2023-08-12 NOTE — Evaluation (Signed)
Physical Therapy Evaluation and Discharge Patient Details Name: Mitchell Parker Sr. MRN: [id number] DOB: [birth year] Today's Date: 08/12/2023  History of Present Illness  72 y.o. male presents to ED 08/11/23 secondary to the worsening site of insect bite on R forearm; 8/17 I&D  PMH includes SDH/SAH, HTN, DMII, neuropathy, DVT, COPD, CKD3, RA, cardiomyopathy, CHF, afib  Clinical Impression   Patient evaluated by Physical Therapy with no further acute PT needs identified. Patient ambulated 200 ft pushing IV pole and is safe to continue this activity on his own. Nursing made aware.  PT is signing off. Thank you for this referral.         If plan is discharge home, recommend the following:     Can travel by private vehicle        Equipment Recommendations None recommended by PT  Recommendations for Other Services       Functional Status Assessment Patient has not had a recent decline in their functional status     Precautions / Restrictions Precautions Precautions: Fall Restrictions Weight Bearing Restrictions: No      Mobility  Bed Mobility Overal bed mobility: Modified Independent                  Transfers Overall transfer level: Modified independent Equipment used: None               General transfer comment: incr attempts before coming to standing; reports this is unusual for him    Ambulation/Gait Ambulation/Gait assistance: Modified independent (Device/Increase time) Gait Distance (Feet): 200 Feet Assistive device: IV Pole Gait Pattern/deviations: WFL(Within Functional Limits)   Gait velocity interpretation: >2.62 ft/sec, indicative of community ambulatory      Stairs            Wheelchair Mobility     Tilt Bed    Modified Rankin (Stroke Patients Only)       Balance Overall balance assessment: Independent                                           Pertinent Vitals/Pain Pain Assessment Pain Assessment:  0-10 Pain Score: 5  Pain Location: R forearm Pain Descriptors / Indicators: Other (Comment) (Stinging) Pain Intervention(s): Limited activity within patient's tolerance, Patient requesting pain meds-RN notified    Home Living Family/patient expects to be discharged to:: Private residence Living Arrangements: Spouse/significant other Available Help at Discharge: Family;Available 24 hours/day Type of Home: House Home Access: Stairs to enter Entrance Stairs-Rails: Can reach both Entrance Stairs-Number of Steps: 4   Home Layout: One level Home Equipment: Cane - single point;BSC/3in1;Shower seat;Grab bars - tub/shower;Rolling Walker (2 wheels)      Prior Function Prior Level of Function : Independent/Modified Independent             Mobility Comments: occasional use of cane for L hip pain       Extremity/Trunk Assessment   Upper Extremity Assessment Upper Extremity Assessment: Overall WFL for tasks assessed (using Rt hand functionally)    Lower Extremity Assessment Lower Extremity Assessment: Overall WFL for tasks assessed    Cervical / Trunk Assessment Cervical / Trunk Assessment: Other exceptions Cervical / Trunk Exceptions: overweight  Communication   Communication Communication: No apparent difficulties  Cognition Arousal: Alert Behavior During Therapy: WFL for tasks assessed/performed Overall Cognitive Status: Within Functional Limits for tasks assessed  General Comments      Exercises     Assessment/Plan    PT Assessment Patient does not need any further PT services  PT Problem List         PT Treatment Interventions      PT Goals (Current goals can be found in the Care Plan section)  Acute Rehab PT Goals PT Goal Formulation: All assessment and education complete, DC therapy    Frequency       Co-evaluation               AM-PAC PT "6 Clicks" Mobility  Outcome Measure Help  needed turning from your back to your side while in a flat bed without using bedrails?: None Help needed moving from lying on your back to sitting on the side of a flat bed without using bedrails?: None Help needed moving to and from a bed to a chair (including a wheelchair)?: None Help needed standing up from a chair using your arms (e.g., wheelchair or bedside chair)?: None Help needed to walk in hospital room?: None Help needed climbing 3-5 steps with a railing? : None 6 Click Score: 24    End of Session   Activity Tolerance: Patient tolerated treatment well Patient left: in chair;with call bell/phone within reach (no alarm indicated per RN) Nurse Communication: Mobility status;Other (comment) (no further PT needs) PT Visit Diagnosis: Difficulty in walking, not elsewhere classified (R26.2)    Time: 0807-0820 PT Time Calculation (min) (ACUTE ONLY): 13 min   Charges:   PT Evaluation $PT Eval Low Complexity: 1 Low   PT General Charges $$ ACUTE PT VISIT: 1 Visit          Jerolyn Center, PT Acute Rehabilitation Services  Office [phone]   Zena Amos 08/12/2023, 8:30 AM

## 2023-08-12 NOTE — H&P (Signed)
Orthopaedic Surgery Hand and Upper Extremity History and Physical Examination 08/12/2023  Referring Provider: No referring provider defined for this encounter.  CC: Right forearm infection  HPI: Mitchell Parker. is a 72 y.o. male with a right forearm infection.  He reports he was likely bit by an insect or spider on 8/10 and developed a wound on the forearm.  His provider at the [facility] prescribed him a course of Doxycycline.  He then presented to the [facility] ED on 8/14 and was put on a course of Bactrim.  He continued to have erythema, swelling, and draining pus on the forearm.   Past Medical History: Past Medical History:  Diagnosis Date   Chronic diarrhea    CKD (chronic kidney disease) stage 3, GFR 30-59 ml/min (HCC)    COPD (chronic obstructive pulmonary disease) (HCC)    Dysrhythmia 02/2022   paroxsymal SVT & aflutter 02/2022 XioXT   Essential hypertension    Gallstones    Gout    History of CHF (congestive heart failure)    History of colonic polyps    History of DVT (deep vein thrombosis) 04/19/2012   BLE DVT 04/09/12   Iron deficiency 02/16/2023   Kidney stones    Lumbar disc disease    Neuropathy    Nonischemic cardiomyopathy (HCC) 2003   Minor coronary atherosclerosis at cardiac catheterization 2003, LVEF initially 20% with normalization   Obesity    Orthostatic hypotension    Presence of Watchman left atrial appendage closure device 02/02/2023   27mm Watchman placed by Dr. Excell Seltzer   RA (rheumatoid arthritis) [facility])    Sleep apnea    Subarachnoid hemorrhage (HCC) 02/03/2022   Traumatic   Subdural hematoma (HCC) 02/03/2022   Traumatic   Syncope, vasovagal    Type 2 diabetes mellitus (HCC)      Medications: Scheduled Meds:  allopurinol  300 mg Oral q morning   amLODipine  10 mg Oral Daily   And   irbesartan  300 mg Oral Daily   atorvastatin  20 mg Oral Daily   calcitRIOL  0.25 mcg Oral Q breakfast   carvedilol  3.125 mg Oral Q breakfast   And   carvedilol   6.25 mg Oral Q supper   cloNIDine  0.2 mg Oral BID   cyanocobalamin  500 mcg Oral q AM   DULoxetine  30 mg Oral BID   enoxaparin (LOVENOX) injection  70 mg Subcutaneous Q24H   furosemide  40 mg Oral q morning   hydroxychloroquine  200 mg Oral BID   insulin aspart  0-15 Units Subcutaneous TID WC   insulin aspart  0-5 Units Subcutaneous QHS   potassium chloride SA  10 mEq Oral Daily   tamsulosin  0.4 mg Oral QHS   Continuous Infusions:  cefTRIAXone (ROCEPHIN)  IV 2 g (08/11/23 2126)   metronidazole 500 mg (08/11/23 2125)   vancomycin     PRN Meds:.acetaminophen **OR** acetaminophen, albuterol, hydrALAZINE, oxyCODONE  Allergies: Allergies as of 08/11/2023 - Review Complete 08/11/2023  Allergen Reaction Noted   Ciprofloxacin Hives 10/26/2011   Definity [perflutren lipid microsphere] Nausea And Vomiting 09/07/2018   Levofloxacin [levofloxacin] Hives 10/26/2011    Past Surgical History: Past Surgical History:  Procedure Laterality Date   BARIATRIC SURGERY  01/11/2018   BURR HOLE Right 05/04/2022   Procedure: BURR HOLES FOR Subdural Hematoma;  Surgeon: Bedelia Person, MD;  Location: [facility] OR;  Service: Neurosurgery;  Laterality: Right;   CHOLECYSTECTOMY  01/18/2012   Procedure: LAPAROSCOPIC  CHOLECYSTECTOMY WITH INTRAOPERATIVE CHOLANGIOGRAM;  Surgeon: Clovis Pu. Cornett, MD;  Location: [facility] ORS;  Service: General;  Laterality: N/A;   COLONOSCOPY  05/2017   [facility]: five polyps removed but only 3 retrieved due to bowel prep, pathology with tubular adenomas   EYE SURGERY Right    Film over retina was removed.   LEFT ATRIAL APPENDAGE OCCLUSION N/A 02/02/2023   Procedure: LEFT ATRIAL APPENDAGE OCCLUSION;  Surgeon: Tonny Bollman, MD;  Location: [facility] INVASIVE CV LAB;  Service: Cardiovascular;  Laterality: N/A;   PILONIDAL CYST EXCISION  1997   TEE WITHOUT CARDIOVERSION N/A 02/02/2023   Procedure: TRANSESOPHAGEAL ECHOCARDIOGRAM;  Surgeon: Tonny Bollman, MD;  Location: [facility]  INVASIVE CV LAB;  Service: Cardiovascular;  Laterality: N/A;   TOE AMPUTATION Right    3 toes on right foot- big toe and next 2   UMBILICAL HERNIA REPAIR     VARICOSE VEIN SURGERY     left leg   watchman implant       Social History: Social History   Occupational History   Occupation: retired  Tobacco Use   Smoking status: Former    Current packs/day: 0.00    Average packs/day: 2.0 packs/day for 25.0 years (50.0 ttl pk-yrs)    Types: Cigarettes    Start date: 12/26/1969    Quit date: 12/26/1994    Years since quitting: 28.6   Smokeless tobacco: Never  Vaping Use   Vaping status: Never Used  Substance and Sexual Activity   Alcohol use: No   Drug use: No   Sexual activity: Not Currently     Family History: Family History  Problem Relation Age of Onset   Hypertension Mother    Stroke Mother    Diabetes Father    Hypertension Father    Heart disease Father    Diabetes Brother    Colon cancer Neg Hx    Otherwise, no relevant orthopaedic family history  ROS: Review of Systems: All systems reviewed and are negative except that mentioned in HPI  Work/Sport/Hobbies: See HPI  Physical Examination: Vitals:   08/11/23 1911 08/11/23 2040  BP: (!) 145/54 (!) 160/75  Pulse: 60 68  Resp: 18 18  Temp: 98 F (36.7 C) 97.6 F (36.4 C)  SpO2: 100% 100%   Constitutional: Awake, alert.  WN/WD Appearance: healthy, no acute distress, well-groomed Affect: Normal HEENT: EOMI, mucous membranes moist CV: RRR Pulm: breathing comfortably  Neck:   FROM, no pain  Right Upper Extremity / Hand Radio volar forearm lesion with pustules and surrounding cellulitis.  Surrounding skin is indurated, erythematous and tender to palpation.  Sensation intact in median, radial, and ulnar distributions.  Motor intact in AIN, PIN, and ulnar distributions.  Fingers warm and well perfused with palpable radial artery distal to wound.  No significant pain with finger and wrist range of motion.   Non-tender along volar forearm proximal to wound.  No subcutaneous crepitus present.   Pertinent Labs: n/a  Imaging: I have personally reviewed the following studies: Right forearm X-rays show soft tissue edema.  Additional Studies: n/a  Assessment/Plan: Cellulitis of right upper extremity  Plan to take to OR for I&D right forearm.  Discussed risks, benefits, and alternatives of procedure and that if this were a brown recluse bite, it may be several weeks before the wound is fully declared and may require skin/soft tissue coverage.    Shaune Pollack, MD Hand and Upper Extremity Surgery [facility] [phone] 08/12/2023 2:08 AM

## 2023-08-12 NOTE — Transfer of Care (Signed)
Immediate Anesthesia Transfer of Care Note  Patient: [name]  Procedure(s) Performed: IRRIGATION AND DEBRIDEMENT RIGHT FOREARM (Right)  Patient Location: PACU  Anesthesia Type:General  Level of Consciousness: awake, alert , and oriented  Airway & Oxygen Therapy: Patient Spontanous Breathing and Patient connected to nasal cannula oxygen  Post-op Assessment: Report given to RN, Post -op Vital signs reviewed and stable, and Patient moving all extremities  Post vital signs: Reviewed and stable  Last Vitals:  Vitals Value Taken Time  BP 142/53 08/12/23 0400  Temp    Pulse 68 08/12/23 0406  Resp 12 08/12/23 0406  SpO2 94 % 08/12/23 0406  Vitals shown include unfiled device data.  Last Pain:  Vitals:   08/11/23 2040  TempSrc: Oral  PainSc:          Complications: No notable events documented.

## 2023-08-12 NOTE — Anesthesia Preprocedure Evaluation (Addendum)
Anesthesia Evaluation  Patient identified by MRN, date of birth, ID band Patient awake    Reviewed: Allergy & Precautions, NPO status , Patient's Chart, lab work & pertinent test results, reviewed documented beta blocker date and time   History of Anesthesia Complications Negative for: history of anesthetic complications  Airway Mallampati: III       Dental no notable dental hx.    Pulmonary sleep apnea , COPD, former smoker   Pulmonary exam normal        Cardiovascular hypertension, Pt. on home beta blockers +CHF  (-) CAD, (-) Past MI, (-) Cardiac Stents and (-) CABG + dysrhythmias Atrial Fibrillation (-) Valvular Problems/Murmurs Rhythm:Regular Rate:Normal     Neuro/Psych neg Seizures    GI/Hepatic   Endo/Other  diabetes, Type 2    Renal/GU CRFRenal disease     Musculoskeletal  (+) Arthritis , Rheumatoid disorders,    Abdominal   Peds  Hematology  (+) Blood dyscrasia, anemia   Anesthesia Other Findings   Reproductive/Obstetrics                              Anesthesia Physical Anesthesia Plan  ASA: 3  Anesthesia Plan: General   Post-op Pain Management:    Induction: Intravenous  PONV Risk Score and Plan: 1 and Ondansetron  Airway Management Planned: LMA  Additional Equipment:   Intra-op Plan:   Post-operative Plan: Extubation in OR  Informed Consent:      Dental advisory given  Plan Discussed with: CRNA  Anesthesia Plan Comments:         Anesthesia Quick Evaluation

## 2023-08-12 NOTE — Progress Notes (Signed)
S/p incision and drainage of the right forearm wound. - Resumed carb consistent heart healthy diet.  Continue sliding scale SSI as needed coverage with meal and bedtime.

## 2023-08-12 NOTE — Plan of Care (Signed)
  Problem: Coping: Goal: Ability to adjust to condition or change in health will improve Outcome: Progressing   

## 2023-08-12 NOTE — Progress Notes (Signed)
Progress Note   Patient: Mitchell Parker ZOX:[id number] DOB: 1951-03-26 DOA: 08/11/2023     1 DOS: the patient was seen and examined on 08/12/2023   Brief hospital course:  Danial Rueff  is a 72 y.o. male, with past medical history of diabetes mellitus, neuropathy, COPD, rheumatoid arthritis, sleep apnea, nonischemic cardiomyopathy, hypertension, CKD, CHF, A-fib status post Watchman procedure last February (finished his Eliquis then Plavix last dose on Friday) -Patient presents to ED secondary to the worsening site of insect bite, patient initially with right forearm insect bite, he thinks is a spider as he was working in his yard, and he was cleaning some spider webs, he presented to ED initially 8/14 not improving on doxycycline given by his PCP, to ED today as he did not improve after switching his doxycycline to Bactrim, he reports worsening redness, pain, swelling, and started to have drainage of pus, he denies fever, chills, he was compliant with his antibiotics. -ED his workup significant for creatinine of 1.4, he was afebrile, no leukocytosis, he has significant wound with purulent drainage, ED discussed with hand surgery at [facility] who recommended admitted to [facility] for possible need for I&D, patient was started on broad-spectrum antibiotic and [facility] hospitalist consulted to admit.  Assessment and Plan:  Right arm wound infection at spider bite site -Continues to have worsening wound infection despite being treated with p.o. antibiotics -s/p I & D with hand surgery -cultures pending -Continue with broad-spectrum antibiotics vancomycin, Rocephin and Flagyl     Paroxysmal A-fib -Not on anticoagulation anymore status post Watchman procedures -He finished his apixaban 6 weeks postprocedure, and he just finished his Plavix 75 mg daily for 6 months last week   Diabetes neuropathy -Continue duloxetine and gabapentin  Type 2 DM -Hold Actos and glimepiride, will keep an insulin  sliding scale   Rheumatoid arthritis -Continue Plaquenil   Hyperlipidemia -Continue statin   Thrombocytopenia -Chronic, at baseline   BPH - continue with flomax   CKd stage IIIb -renal function at baseline  Hold nephrotoxic agents   History of subarachnoid hemorrhage and Subdural Hematoma -Has been in the past secondary to multiple falls, he is not on anticoagulation anymore he denies any recent falls   Hypertension -continue with home medication Coreg, clonidine and Azor   Subjective: still with some pain, but is improved.  Physical Exam: Vitals:   08/12/23 0440 08/12/23 0458 08/12/23 0627 08/12/23 0748  BP: (!) 166/57 (!) 162/59 (!) 141/64 (!) 143/58  Pulse: 70 72 76 69  Resp: 14  18 16   Temp: 97.9 F (36.6 C) 97.7 F (36.5 C)  98.6 F (37 C)  TempSrc:      SpO2: 98% 97% 96% 100%  Weight:      Height:       Physical Examination: General appearance - alert, well appearing, and in no distress Mental status - alert, oriented to person, place, and time Chest - clear to auscultation, no wheezes, rales or rhonchi, symmetric air entry Heart - normal rate, regular rhythm, normal S1, S2, no murmurs, rubs, clicks or gallops Abdomen - soft, nontender, nondistended, no masses or organomegaly Neurological - alert, oriented, normal speech, no focal findings or movement disorder noted Skin - right arm wrapped  Data Reviewed: Results for orders placed or performed during the hospital encounter of 08/11/23 (from the past 24 hour(s))  CBC     Status: Abnormal   Collection Time: 08/11/23  4:49 PM  Result Value Ref Range  WBC 4.9 4.0 - 10.5 K/uL   RBC 3.79 (L) 4.22 - 5.81 MIL/uL   Hemoglobin 12.5 (L) 13.0 - 17.0 g/dL   HCT 96.0 (L) 45.4 - 09.8 %   MCV 98.2 80.0 - 100.0 fL   MCH 33.0 26.0 - 34.0 pg   MCHC 33.6 30.0 - 36.0 g/dL   RDW 11.9 14.7 - 82.9 %   Platelets 147 (L) 150 - 400 K/uL   nRBC 0.0 0.0 - 0.2 %  Basic metabolic panel     Status: Abnormal   Collection Time:  08/11/23  4:49 PM  Result Value Ref Range   Sodium 136 135 - 145 mmol/L   Potassium 4.4 3.5 - 5.1 mmol/L   Chloride 102 98 - 111 mmol/L   CO2 22 22 - 32 mmol/L   Glucose, Bld 90 70 - 99 mg/dL   BUN 35 (H) 8 - 23 mg/dL   Creatinine, Ser 5.62 (H) 0.61 - 1.24 mg/dL   Calcium 8.2 (L) 8.9 - 10.3 mg/dL   GFR, Estimated 54 (L) >60 mL/min   Anion gap 12 5 - 15  Blood culture (routine x 2)     Status: None (Preliminary result)   Collection Time: 08/11/23  4:49 PM   Specimen: BLOOD  Result Value Ref Range   Specimen Description BLOOD BLOOD LEFT HAND    Special Requests      BOTTLES DRAWN AEROBIC AND ANAEROBIC Blood Culture results may not be optimal due to an excessive volume of blood received in culture bottles   Culture      NO GROWTH < 24 HOURS Performed at [facility] [address], [place], [place] [address]    Report Status PENDING   Blood culture (routine x 2)     Status: None (Preliminary result)   Collection Time: 08/11/23  4:49 PM   Specimen: BLOOD  Result Value Ref Range   Specimen Description BLOOD BLOOD RIGHT HAND    Special Requests      BOTTLES DRAWN AEROBIC AND ANAEROBIC Blood Culture results may not be optimal due to an excessive volume of blood received in culture bottles   Culture      NO GROWTH < 24 HOURS Performed at [facility], [address] [place] [address] [place], [place] [address]    Report Status PENDING   Hemoglobin A1c     Status: Abnormal   Collection Time: 08/11/23  4:49 PM  Result Value Ref Range   Hgb A1c MFr Bld 6.1 (H) 4.8 - 5.6 %   Mean Plasma Glucose 128 mg/dL  Glucose, capillary     Status: None   Collection Time: 08/11/23  9:52 PM  Result Value Ref Range   Glucose-Capillary 86 70 - 99 mg/dL  HIV Antibody (routine testing w rflx)     Status: None   Collection Time: 08/12/23 12:25 AM  Result Value Ref Range   HIV Screen 4th Generation wRfx Non Reactive Non Reactive  Basic metabolic panel     Status: Abnormal   Collection Time: 08/12/23 12:25 AM   Result Value Ref Range   Sodium 136 135 - 145 mmol/L   Potassium 3.7 3.5 - 5.1 mmol/L   Chloride 104 98 - 111 mmol/L   CO2 21 (L) 22 - 32 mmol/L   Glucose, Bld 91 70 - 99 mg/dL   BUN 28 (H) 8 - 23 mg/dL   Creatinine, Ser 9.62 0.61 - 1.24 mg/dL   Calcium 8.1 (L) 8.9 - 10.3 mg/dL   GFR, Estimated >  60 >60 mL/min   Anion gap 11 5 - 15  CBC     Status: Abnormal   Collection Time: 08/12/23 12:25 AM  Result Value Ref Range   WBC 4.7 4.0 - 10.5 K/uL   RBC 3.77 (L) 4.22 - 5.81 MIL/uL   Hemoglobin 12.4 (L) 13.0 - 17.0 g/dL   HCT 44.0 (L) 34.7 - 42.5 %   MCV 98.1 80.0 - 100.0 fL   MCH 32.9 26.0 - 34.0 pg   MCHC 33.5 30.0 - 36.0 g/dL   RDW 95.6 38.7 - 56.4 %   Platelets 142 (L) 150 - 400 K/uL   nRBC 0.0 0.0 - 0.2 %  C-reactive protein     Status: None   Collection Time: 08/12/23 12:25 AM  Result Value Ref Range   CRP 0.8 <1.0 mg/dL  Aerobic/Anaerobic Culture w Gram Stain (surgical/deep wound)     Status: None (Preliminary result)   Collection Time: 08/12/23  3:30 AM   Specimen: Abscess  Result Value Ref Range   Specimen Description WOUND    Special Requests RIGHT FOREARM ABSC SPEC A    Gram Stain      MODERATE WBC PRESENT,BOTH PMN AND MONONUCLEAR FEW GRAM POSITIVE COCCI IN CLUSTERS Performed at [facility], [address], [place], [place] [address]    Culture PENDING    Report Status PENDING   Glucose, capillary     Status: Abnormal   Collection Time: 08/12/23  6:29 AM  Result Value Ref Range   Glucose-Capillary 116 (H) 70 - 99 mg/dL   DG Forearm Right  Result Date: 08/09/2023 CLINICAL DATA:  Possible spider bite on Saturday. Progressive redness, pain and swelling. Evaluate for cortical irregularity. EXAM: RIGHT FOREARM - 2 VIEW COMPARISON:  None Available. FINDINGS: Skin thickening and soft tissue edema is noted along the volar surface of the mid and distal forearm. The underlying osseous structures appear intact. There is no sign of acute fracture, dislocation or bone  erosion to suggest osteomyelitis. Degenerative changes noted at the radiocarpal joint. Vascular calcifications identified. IMPRESSION: 1. Skin thickening and soft tissue edema along the volar surface of the mid and distal forearm. 2. No signs of osteomyelitis. Electronically Signed   By: Signa Kell M.D.   On: 08/09/2023 12:56    Family Communication: Patient at bedside  Disposition: Status is: Inpatient Remains inpatient appropriate because: IV Abx need, failed outpt. therapry  Planned Discharge Destination: Home DVT ppx: Lovenox  Time spent: 37 minutes  Author: Reva Bores, MD 08/12/2023 10:40 AM  For on call review [email or web address].

## 2023-08-12 NOTE — Progress Notes (Signed)
   08/12/23 2123  BiPAP/CPAP/SIPAP  Reason BIPAP/CPAP not in use Non-compliant

## 2023-08-12 NOTE — Op Note (Signed)
NAME: Mitchell DIETEL Sr. MEDICAL RECORD NO: [id number] DATE OF BIRTH: [birth year] FACILITY: [facility] LOCATION: [facility] OR PHYSICIAN: Ramon Dredge MD   OPERATIVE REPORT   DATE OF PROCEDURE: 08/12/23    PREOPERATIVE DIAGNOSIS:  right forearm abscess   POSTOPERATIVE DIAGNOSIS:  same   PROCEDURE:  Right forearm excisional irrigation and debridement   SURGEON: Edsel Petrin. Nyal Schachter, M.D.   ASSISTANT: none   ANESTHESIA:  General   INTRAVENOUS FLUIDS:  Per anesthesia flow sheet.   ESTIMATED BLOOD LOSS:  Minimal.   COMPLICATIONS:  None.   SPECIMENS: Cultures sent for aerobic, anaerobic, fungal, and AFB   TOURNIQUET TIME:    Total Tourniquet Time Documented: Upper Arm (Right) - 21 minutes Total: Upper Arm (Right) - 21 minutes    DISPOSITION:  Stable to PACU.   INDICATIONS: 72 year old male with right forearm wound that he developed last Saturday was concerning for a spider bite.  He has been on antibiotics for several days now without improvement.  OPERATIVE COURSE: Patient was identified in holding and the site, side, surgery.  The risks, benefits, and alternatives of the procedure were reviewed and the patient wished to proceed.  The patient was brought back to the operating room and transferred to the OR table with his right upper extremity outstretched on a hand table.  The anesthesia team induced general anesthesia.  A tourniquet was applied to the upper arm and the patient was prepped and draped in typical sterile fashion.  The arm was held elevated for gravity exsanguination and the tourniquet was inflated 250 mmHg.  There were 4 main superficial ulcerations/pustules.  Incision was made through the full-thickness of the skin between them approximately 5 to 6 cm in length.  After getting through the full-thickness of the skin there was a mixture of purulence and blood present which was then cultured.  There did not appear to be any subfascial involvement initially,  but a 2 cm rent was opened to ensure that there was no infection below the fascia.  The wound was washed out with 3 L of normal saline and necrotic areas were sharply debrided with rongeurs.  Tourniquet was dropped and hemostasis was achieved with bipolar electrocautery.  The wound was then packed with quarter inch iodoform packing.  It was then bandaged with 4 x 4 gauze, Kerlix, and an Ace bandage.  This was an infected wound to begin with and should it need repeat irrigation and debridement, this is not a new infection but due to the original infection.  The patient was then awakened from anesthesia and transferred to the PACU without issue.  All counts were correct.   POSTOPERATIVE PLAN:  Patient will remain admitted on broad-spectrum IV antibiotics until cultures with sensitivities return.  Patient will need every 24 hour dressing changes with replacement of the iodoform packing, or sooner should the dressings become saturated.  Please obtain daily CRP and CBC labs to monitor for resolution of infection.    Ramon Dredge, MD Electronically signed, 08/12/23

## 2023-08-12 NOTE — Plan of Care (Signed)
  Problem: Metabolic: Goal: Ability to maintain appropriate glucose levels will improve Outcome: Progressing   Problem: Skin Integrity: Goal: Risk for impaired skin integrity will decrease Outcome: Progressing   Problem: Tissue Perfusion: Goal: Adequacy of tissue perfusion will improve Outcome: Progressing   

## 2023-08-12 NOTE — Hospital Course (Signed)
Mitchell Parker  is a 73 y.o. male, with past medical history of diabetes mellitus, neuropathy, COPD, rheumatoid arthritis, sleep apnea, nonischemic cardiomyopathy, hypertension, CKD, CHF, A-fib status post Watchman procedure last February (finished his Eliquis then Plavix last dose on Friday) -Patient presents to ED secondary to the worsening site of insect bite, patient initially with right forearm insect bite, he thinks is a spider as he was working in his yard, and he was cleaning some spider webs, he presented to ED initially 8/14 not improving on doxycycline given by his PCP, to ED today as he did not improve after switching his doxycycline to Bactrim, he reports worsening redness, pain, swelling, and started to have drainage of pus, he denies fever, chills, he was compliant with his antibiotics. -ED his workup significant for creatinine of 1.4, he was afebrile, no leukocytosis, he has significant wound with purulent drainage, ED discussed with hand surgery at [facility] who recommended admitted to [facility] for possible need for I&D, patient was started on broad-spectrum antibiotic and [facility] hospitalist consulted to admit.

## 2023-08-12 NOTE — Anesthesia Procedure Notes (Signed)
Procedure Name: Intubation Date/Time: 08/12/2023 2:51 AM  Performed by: Fishel Wamble T, CRNAPre-anesthesia Checklist: Patient identified, Emergency Drugs available, Suction available and Patient being monitored Patient Re-evaluated:Patient Re-evaluated prior to induction Oxygen Delivery Method: Circle system utilized Preoxygenation: Pre-oxygenation with 100% oxygen Induction Type: IV induction, Rapid sequence and Cricoid Pressure applied Ventilation: Mask ventilation without difficulty Laryngoscope Size: Mac and 4 Grade View: Grade II Tube type: Oral Tube size: 7.5 mm Number of attempts: 1 Airway Equipment and Method: Stylet and Oral airway Placement Confirmation: ETT inserted through vocal cords under direct vision, positive ETCO2 and breath sounds checked- equal and bilateral Secured at: 23 cm Tube secured with: Tape Dental Injury: Teeth and Oropharynx as per pre-operative assessment

## 2023-08-13 ENCOUNTER — Encounter (HOSPITAL_COMMUNITY): Payer: Self-pay | Admitting: Orthopedic Surgery

## 2023-08-13 ENCOUNTER — Telehealth (HOSPITAL_BASED_OUTPATIENT_CLINIC_OR_DEPARTMENT_OTHER): Payer: Self-pay | Admitting: *Deleted

## 2023-08-13 DIAGNOSIS — T148XXA Other injury of unspecified body region, initial encounter: Secondary | ICD-10-CM | POA: Diagnosis not present

## 2023-08-13 DIAGNOSIS — L089 Local infection of the skin and subcutaneous tissue, unspecified: Secondary | ICD-10-CM | POA: Diagnosis not present

## 2023-08-13 LAB — CBC WITH DIFFERENTIAL/PLATELET
Abs Immature Granulocytes: 0.02 10*3/uL (ref 0.00–0.07)
Basophils Absolute: 0 10*3/uL (ref 0.0–0.1)
Basophils Relative: 1 %
Eosinophils Absolute: 0.3 10*3/uL (ref 0.0–0.5)
Eosinophils Relative: 6 %
HCT: 37.1 % — ABNORMAL LOW (ref 39.0–52.0)
Hemoglobin: 12.5 g/dL — ABNORMAL LOW (ref 13.0–17.0)
Immature Granulocytes: 0 %
Lymphocytes Relative: 16 %
Lymphs Abs: 0.8 10*3/uL (ref 0.7–4.0)
MCH: 32.3 pg (ref 26.0–34.0)
MCHC: 33.7 g/dL (ref 30.0–36.0)
MCV: 95.9 fL (ref 80.0–100.0)
Monocytes Absolute: 0.5 10*3/uL (ref 0.1–1.0)
Monocytes Relative: 10 %
Neutro Abs: 3.4 10*3/uL (ref 1.7–7.7)
Neutrophils Relative %: 67 %
Platelets: 157 10*3/uL (ref 150–400)
RBC: 3.87 MIL/uL — ABNORMAL LOW (ref 4.22–5.81)
RDW: 13.5 % (ref 11.5–15.5)
WBC: 5 10*3/uL (ref 4.0–10.5)
nRBC: 0 % (ref 0.0–0.2)

## 2023-08-13 LAB — GLUCOSE, CAPILLARY
Glucose-Capillary: 87 mg/dL (ref 70–99)
Glucose-Capillary: 137 mg/dL — ABNORMAL HIGH (ref 70–99)
Glucose-Capillary: 111 mg/dL — ABNORMAL HIGH (ref 70–99)
Glucose-Capillary: 155 mg/dL — ABNORMAL HIGH (ref 70–99)

## 2023-08-13 LAB — C-REACTIVE PROTEIN: CRP: 0.7 mg/dL (ref <1.0)

## 2023-08-13 MED ORDER — VANCOMYCIN HCL IN DEXTROSE 1-5 GM/200ML-% IV SOLN
1000.0000 mg | Freq: Two times a day (BID) | INTRAVENOUS | Status: DC
  Administered 2023-08-14: 1000 mg via INTRAVENOUS
  Filled 2023-08-13: qty 200

## 2023-08-13 NOTE — Telephone Encounter (Signed)
Post ED Visit - Positive Culture Follow-up  Culture report reviewed by antimicrobial stewardship pharmacist: [facility] []  Enzo Bi, Pharm.D. []  Celedonio Miyamoto, Pharm.D., BCPS AQ-ID []  Garvin Fila, Pharm.D., BCPS []  Georgina Pillion, Pharm.D., BCPS []  [place], [address].D., BCPS, AAHIVP []  Estella Husk, Pharm.D., BCPS, AAHIVP []  Lysle Pearl, PharmD, BCPS []  Phillips Climes, PharmD, BCPS []  Agapito Games, PharmD, BCPS []  Verlan Friends, PharmD []  Mervyn Gay, PharmD, BCPS [x]  Delmar Landau, PharmD  [facility] []  Len Childs, PharmD []  Greer Pickerel, PharmD []  Adalberto Cole, PharmD []  Perlie Gold, Rph []  Lonell Face) Jean Rosenthal, PharmD []  Earl Many, PharmD []  Junita Push, PharmD []  Dorna Leitz, PharmD []  Terrilee Files, PharmD []  Lynann Beaver, PharmD []  Keturah Barre, PharmD []  Loralee Pacas, PharmD []  Bernadene Person, PharmD   Positive aerobic culture Treated with Sulfamethoxazole-Trimethoprim. Admitted on appropriate IV abx. No action needed  Patsey Berthold 08/13/2023, 1:42 PM

## 2023-08-13 NOTE — Progress Notes (Signed)
S: saw pt yesterday afternoon. Doing well, still having stinging pain. Was icing operative site.   O: able to make a fist. Able to gently range wrist. NV intact.   CRP normal yesterday and today. Cultures still pending.   A/P: s/p I&D of forearm.   -cont IV abx until cultures return -okay to stop CRP after Monday if remains normal -start daily dressing changes including packing wound with iodoform starting today. Gently pack with iodoform, cover with telfa, wrap with kerlix, then cover with ACE bandage.

## 2023-08-13 NOTE — Anesthesia Postprocedure Evaluation (Signed)
Anesthesia Post Note  Patient: Mitchell WOODRUM Sr.  Procedure(s) Performed: IRRIGATION AND DEBRIDEMENT RIGHT FOREARM (Right)     Patient location during evaluation: PACU Anesthesia Type: General Level of consciousness: awake and alert Pain management: pain level controlled Vital Signs Assessment: post-procedure vital signs reviewed and stable Respiratory status: spontaneous breathing, nonlabored ventilation, respiratory function stable and patient connected to nasal cannula oxygen Cardiovascular status: blood pressure returned to baseline and stable Postop Assessment: no apparent nausea or vomiting Anesthetic complications: no   No notable events documented.  Last Vitals:  Vitals:   08/13/23 0329 08/13/23 0740  BP: 125/65 (!) 160/65  Pulse: (!) 58 (!) 57  Resp: 20 16  Temp: 36.7 C 36.8 C  SpO2: 99% 98%    Last Pain:  Vitals:   08/13/23 0740  TempSrc: Oral  PainSc:                  Mariann Barter

## 2023-08-13 NOTE — Progress Notes (Signed)
   08/13/23 2231  BiPAP/CPAP/SIPAP  Reason BIPAP/CPAP not in use Non-compliant

## 2023-08-13 NOTE — Progress Notes (Signed)
Progress Note   Patient: Mitchell Parker NUU:[id number] DOB: 18-Jun-1951 DOA: 08/11/2023     2 DOS: the patient was seen and examined on 08/13/2023   Brief hospital course:  Mitchell Parker  is a 72 y.o. male, with past medical history of diabetes mellitus, neuropathy, COPD, rheumatoid arthritis, sleep apnea, nonischemic cardiomyopathy, hypertension, CKD, CHF, A-fib status post Watchman procedure last February (finished his Eliquis then Plavix last dose on Friday) -Patient presents to ED secondary to the worsening site of insect bite, patient initially with right forearm insect bite, he thinks is a spider as he was working in his yard, and he was cleaning some spider webs, he presented to ED initially 8/14 not improving on doxycycline given by his PCP, to ED today as he did not improve after switching his doxycycline to Bactrim, he reports worsening redness, pain, swelling, and started to have drainage of pus, he denies fever, chills, he was compliant with his antibiotics. -ED his workup significant for creatinine of 1.4, he was afebrile, no leukocytosis, he has significant wound with purulent drainage, ED discussed with hand surgery at [facility] who recommended admitted to [facility] for possible need for I&D, patient was started on broad-spectrum antibiotic and [facility] hospitalist consulted to admit.  Assessment and Plan: Right arm wound infection at spider bite site -Continued to have worsening wound infection despite being treated with p.o. antibiotics -now s/p I & D with hand surgery -cultures pending consistent with staph though ID and sensitivities are still pending -Continue with broad-spectrum antibiotics vancomycin, Rocephin and Flagyl -Daily CBC and CRP -Daily dressing changes with iodoform packing per hand surgeon     Paroxysmal A-fib -Not on anticoagulation anymore status post Watchman procedures -He finished his apixaban 6 weeks postprocedure, and he just finished his Plavix 75 mg  daily for 6 months last week   Diabetes neuropathy -Continue duloxetine and gabapentin   Type 2 DM -Hold Actos and glimepiride, will keep an insulin sliding scale   Rheumatoid arthritis -Continue Plaquenil   Hyperlipidemia -Continue statin   Thrombocytopenia -Chronic, at baseline   BPH - continue with flomax   CKd stage IIIb -renal function at baseline  Hold nephrotoxic agents   History of subarachnoid hemorrhage and Subdural Hematoma -Has been in the past secondary to multiple falls, he is not on anticoagulation anymore he denies any recent falls   Hypertension -continue with home medication Coreg, clonidine and Azor  Subjective: Reports slight amount of improvement in his pain today.  Physical Exam: Vitals:   08/12/23 1616 08/12/23 1936 08/13/23 0329 08/13/23 0740  BP: (!) 125/58 (!) 122/58 125/65 (!) 160/65  Pulse: 62 70 (!) 58 (!) 57  Resp: 16 18 20 16   Temp: 97.8 F (36.6 C) 98.2 F (36.8 C) 98.1 F (36.7 C) 98.2 F (36.8 C)  TempSrc:    Oral  SpO2: 96% 96% 99% 98%  Weight:      Height:       Physical Examination: General appearance - alert, well appearing, and in no distress Chest -normal effort Heart - normal rate and regular rhythm Abdomen - soft, nontender, nondistended, no masses or organomegaly Extremities -Ace bandage wrapped around his right arm  Data Reviewed: Results for orders placed or performed during the hospital encounter of 08/11/23 (from the past 24 hour(s))  Glucose, capillary     Status: Abnormal   Collection Time: 08/12/23 12:06 PM  Result Value Ref Range   Glucose-Capillary 116 (H) 70 - 99 mg/dL  Glucose,  capillary     Status: Abnormal   Collection Time: 08/12/23  4:18 PM  Result Value Ref Range   Glucose-Capillary 105 (H) 70 - 99 mg/dL  Glucose, capillary     Status: Abnormal   Collection Time: 08/12/23  9:05 PM  Result Value Ref Range   Glucose-Capillary 106 (H) 70 - 99 mg/dL  C-reactive protein     Status: None    Collection Time: 08/13/23  2:04 AM  Result Value Ref Range   CRP 0.7 <1.0 mg/dL  Glucose, capillary     Status: None   Collection Time: 08/13/23  7:40 AM  Result Value Ref Range   Glucose-Capillary 87 70 - 99 mg/dL  CBC with Differential/Platelet     Status: Abnormal   Collection Time: 08/13/23  9:33 AM  Result Value Ref Range   WBC 5.0 4.0 - 10.5 K/uL   RBC 3.87 (L) 4.22 - 5.81 MIL/uL   Hemoglobin 12.5 (L) 13.0 - 17.0 g/dL   HCT 21.3 (L) 08.6 - 57.8 %   MCV 95.9 80.0 - 100.0 fL   MCH 32.3 26.0 - 34.0 pg   MCHC 33.7 30.0 - 36.0 g/dL   RDW 46.9 62.9 - 52.8 %   Platelets 157 150 - 400 K/uL   nRBC 0.0 0.0 - 0.2 %   Neutrophils Relative % 67 %   Neutro Abs 3.4 1.7 - 7.7 K/uL   Lymphocytes Relative 16 %   Lymphs Abs 0.8 0.7 - 4.0 K/uL   Monocytes Relative 10 %   Monocytes Absolute 0.5 0.1 - 1.0 K/uL   Eosinophils Relative 6 %   Eosinophils Absolute 0.3 0.0 - 0.5 K/uL   Basophils Relative 1 %   Basophils Absolute 0.0 0.0 - 0.1 K/uL   Immature Granulocytes 0 %   Abs Immature Granulocytes 0.02 0.00 - 0.07 K/uL     Family Communication: Patient at bedside  Disposition: Status is: Inpatient Remains inpatient appropriate because: IV Antibiotics  Planned Discharge Destination: Home DVT prophylaxis: Lovenox Time spent: 36 minutes  Author: Reva Bores, MD 08/13/2023 10:53 AM  For on call review [email or web address].

## 2023-08-13 NOTE — Plan of Care (Signed)
Problem: Education: Goal: Knowledge of General Education information will improve Description: Including pain rating scale, medication(s)/side effects and non-pharmacologic comfort measures Outcome: Progressing Pt understands he was admitted into the hospital for worsening right forearm pain r/t a possible spider bite.  He had an I/D of the site on 08/12/2023.  Pt will remain on IV abx per MD's orders.      Problem: Clinical Measurements: Goal: Ability to maintain clinical measurements within normal limits will improve Outcome: Progressing Pt's slightly hypertensive and bradycardic this shift MD is aware.    Problem: Clinical Measurements: Goal: Will remain free from infection Outcome: Progressing S/Sx of infection monitored and assessed q8 hours.  Pt has remained afebrile thus far.  He is on IV abx per MD's orders.      Problem: Clinical Measurements: Goal: Respiratory complications will improve Outcome: Progressing Respiratory status monitored and assessed q8 hours.  Pt is on room air with PO2 saturations at 98-99% and respirations of 16-18 breaths per minute.  He has not endorsed c/o SOB and DOE.    Problem: Activity: Goal: Risk for activity intolerance will decrease Outcome: Progressing Pt is independent of all his ADLs. He does not need the assistance of RN staff to get OOB.  He was observed ambulating in his room with a steady gait.   Problem: Nutrition: Goal: Adequate nutrition will be maintained Outcome: Progressing Pt is on a heart healthy/ carb modified diet per MD's orders.  He has been able to tolerate his diet without s/sx of n/v or abdominal pain/ distention.     Problem: Elimination: Goal: Will not experience complications related to bowel motility Outcome: Progressing Pt LBM was in 08/12/2023.  He has not endorse c/o constipation.    Problem: Elimination: Goal: Will not experience complications related to urinary retention Outcome: Progressing Pt has denied c/o  dysuria or abdominal distention/ pain.   Problem: Pain Management: Goal: General experience of comfort will improve Outcome: Progressing Pt has endorsed c/o 3-8/10 right arm pain describing it as a constant tender, aching, penetrating, discomfort.  Reiterated pain scale so he could adequately rate his pain.  Pt stated his pain goal this admission 0/10.  Discussed nonpharmacological methods to help reduce s/sx of pain.  Interventions given per pt's request and MD's orders.    Problem: Safety: Goal: Ability to remain free from injury will improve Outcome: Progressing Pt has remained free from falls thus far.  Instructed pt to utilize RN call light for assistance.  Hourly rounds performed.  Bed alarm implemented to keep pt safe from falls.  Settings activated to third most sensitive mode.  Bed in lowest position, locked with two upper side rails engaged.  Belongings and call light within reach.    Problem: Skin Integrity: Goal: Risk for impaired skin integrity will decrease Outcome: Progressing Skin integrity monitored and assessed q-shift. Pt is on q2 hourly turns to prevent further skin impairment.  Tubes and drains assessed for device related pressure sores.  Pt is continent of both bowel and bladder.

## 2023-08-13 NOTE — Progress Notes (Signed)
Pharmacy Antibiotic Note  Mitchell Parker. is a 72 y.o. male admitted on 08/11/2023 with cellulitis s/p spider bite.  Has tried doxycycline and bactrim pta with no improvement. BCx drawn, wound Cx with staph aureus. He is now s/p ID 8/17. Pharmacy has been consulted for vancomycin dosing.  Plan: Adjust vancomycin to 1g IV q12h est AUC 519 using SCr 1.24, Vd 0.5 Ceftriaxone 2gm q24 per MD Metronidazole 500mg  q12h per MD also ordered F/u culture data  Height: 6\' 1"  (185.4 cm) Weight: (!) 136.5 kg (301 lb) IBW/kg (Calculated) : 79.9  Temp (24hrs), Avg:98.1 F (36.7 C), Min:97.8 F (36.6 C), Max:98.2 F (36.8 C)  Recent Labs  Lab 08/09/23 1038 08/11/23 1233 08/11/23 1649 08/12/23 0025 08/13/23 0933  WBC 6.0 4.9 4.9 4.7 5.0  CREATININE 1.22 1.28* 1.40* 1.24  --   LATICACIDVEN 0.7  --   --   --   --     Estimated Creatinine Clearance: 80.4 mL/min (by C-G formula based on SCr of 1.24 mg/dL).    Allergies  Allergen Reactions   Ciprofloxacin Hives   Definity [Perflutren Lipid Microsphere] Nausea And Vomiting    During ECHO with Definity injection pt became nauseated and vomited    Levofloxacin [Levofloxacin] Hives    Antimicrobials this admission: Vanc 8/16> Ceftrix2gm 8/16> Metronid 8/16>  Dose adjustments this admission: 8/18 decrease vancomycin 1250mg  > 1000mg  IV q12h  Micro data: 8/14 wound cx (superficial): few MRSA 8/16 Bcx: ngtd 8/17 wound cx (OR): GPC clusters  Loralee Pacas, PharmD, BCPS 08/13/2023 10:06 AM  Please check AMION for all [facility] phone numbers After 10:00 PM, call Main Pharmacy [phone]

## 2023-08-14 DIAGNOSIS — T148XXA Other injury of unspecified body region, initial encounter: Secondary | ICD-10-CM | POA: Diagnosis not present

## 2023-08-14 DIAGNOSIS — L089 Local infection of the skin and subcutaneous tissue, unspecified: Secondary | ICD-10-CM | POA: Diagnosis not present

## 2023-08-14 DIAGNOSIS — I480 Paroxysmal atrial fibrillation: Secondary | ICD-10-CM | POA: Diagnosis not present

## 2023-08-14 LAB — C-REACTIVE PROTEIN: CRP: 0.6 mg/dL (ref <1.0)

## 2023-08-14 LAB — CBC WITH DIFFERENTIAL/PLATELET
Abs Immature Granulocytes: 0.02 10*3/uL (ref 0.00–0.07)
Basophils Absolute: 0 10*3/uL (ref 0.0–0.1)
Basophils Relative: 1 %
Eosinophils Absolute: 0.3 10*3/uL (ref 0.0–0.5)
Eosinophils Relative: 7 %
HCT: 37 % — ABNORMAL LOW (ref 39.0–52.0)
Hemoglobin: 12.3 g/dL — ABNORMAL LOW (ref 13.0–17.0)
Immature Granulocytes: 0 %
Lymphocytes Relative: 22 %
Lymphs Abs: 1 10*3/uL (ref 0.7–4.0)
MCH: 32 pg (ref 26.0–34.0)
MCHC: 33.2 g/dL (ref 30.0–36.0)
MCV: 96.4 fL (ref 80.0–100.0)
Monocytes Absolute: 0.5 10*3/uL (ref 0.1–1.0)
Monocytes Relative: 11 %
Neutro Abs: 2.7 10*3/uL (ref 1.7–7.7)
Neutrophils Relative %: 59 %
Platelets: 138 10*3/uL — ABNORMAL LOW (ref 150–400)
RBC: 3.84 MIL/uL — ABNORMAL LOW (ref 4.22–5.81)
RDW: 13.4 % (ref 11.5–15.5)
WBC: 4.6 10*3/uL (ref 4.0–10.5)
nRBC: 0 % (ref 0.0–0.2)

## 2023-08-14 LAB — GLUCOSE, CAPILLARY
Glucose-Capillary: 118 mg/dL — ABNORMAL HIGH (ref 70–99)
Glucose-Capillary: 117 mg/dL — ABNORMAL HIGH (ref 70–99)

## 2023-08-14 MED ORDER — DOXYCYCLINE HYCLATE 100 MG PO CAPS: 100.0000 mg | ORAL_CAPSULE | Freq: Two times a day (BID) | ORAL | 0 refills | Status: AC

## 2023-08-14 MED ORDER — OXYCODONE HCL 5 MG PO TABS: 5.0000 mg | ORAL_TABLET | ORAL | 0 refills | Status: DC | PRN

## 2023-08-14 NOTE — Discharge Instructions (Signed)
[facility] Post-Operative Instructions    General -These instructions are to compliment information given to you by your surgeon. -You may resume your normal diet as tolerated.  -You may resume your normal medications unless specifically instructed to stop taking a certain medication. -If you are not sure about restarting one of your medications after surgery, please contact the office during normal business hours and we will be able to assist you.   Weightbearing/Activity  You may weightbear small items (<2 lb. such as a cup of coffee) with your operative extremity. You may write with your operative extremity. Please do not do any heavy lifting or repetitive activities with your operative arm. Do not put any pressure on your incision.   Please start finger, hand, and wrist range of motion exercises right away.     Pain Control - After your surgery, post-surgical discomfort or pain is normal. This discomfort can last several days to a few weeks. At certain times of the day (usually evenings/nights) your discomfort may be more intense.  - Do not drive while taking narcotic pain medications.  Other pain relieving options:  Elevation: Elevating your operative extremity can be very helpful to reduce this pain. Prop your arm up on pillows to keep the operative site above the level of your heart. If you develop tingling from prolonged elevation, take a break from elevating and this should resolve.  Icing: If you do not have a splint/cast, using a cold pack to ice the affected area a few times a day (15 to 20 minutes at a time) can also help to relieve pain, reduce swelling and bruising.   If you can take nonsteroidal anti-inflammatory medications (NSAIDs, for example: Ibuprofen, Advil, Motrin, Aleve, etc.), you may take them to help control your pain. If you are unsure whether you can take anti-inflammatory medications, please check with your  primary care provider.  If you are already taking a prescription of anti-inflammatory medications such as Celebrex (celecoxib) or Mobic (meloxicam) you should not take any additional anti-inflammatory drugs such as Ibuprofen, Advil, Motrin, or Aleve.    Follow Up Please call [facility] at [phone] if you do not receive or are unsure of your first follow-up appointment.  You should see your surgeon or PA in approximately 7 days after your surgery unless otherwise instructed.    Please call the office for any problems, including the following:  - Excessive redness of the incisions - Drainage for more than 4 days - Fever of more than 101.5 F - Nausea/vomiting that does not stop  - Numbness, tingling, or discoloration of extremity  - Unable to drink fluids  - Uncontrollable pain     Shaune Pollack, MD Hand and Upper Extremity Surgery [facility] [phone]

## 2023-08-14 NOTE — Plan of Care (Signed)
Problem: Education: Goal: Knowledge of General Education information will improve Description: Including pain rating scale, medication(s)/side effects and non-pharmacologic comfort measures Outcome: Progressing Pt understands he was admitted into the hospital for worsening right forearm pain r/t a possible spider bite.  He had an I/D of the site on 08/12/2023.  Pt will remain on IV abx per MD's orders. He is currently awaiting d/c home.     Problem: Clinical Measurements: Goal: Ability to maintain clinical measurements within normal limits will improve Outcome: Progressing Pt's slightly hypertensive and bradycardic this shift MD is aware.    Problem: Clinical Measurements: Goal: Will remain free from infection Outcome: Progressing S/Sx of infection monitored and assessed q8 hours.  Pt has remained afebrile thus far.  He is on IV abx per MD's orders.      Problem: Clinical Measurements: Goal: Respiratory complications will improve Outcome: Progressing Respiratory status monitored and assessed q8 hours.  Pt is on room air with PO2 saturations at 98% and respirations of 16 breaths per minute.  He has not endorsed c/o SOB and DOE.    Problem: Activity: Goal: Risk for activity intolerance will decrease Outcome: Progressing Pt is independent of all his ADLs. He does not need the assistance of RN staff to get OOB.  He was observed ambulating in his room with a steady gait.   Problem: Nutrition: Goal: Adequate nutrition will be maintained Outcome: Progressing Pt is on a heart healthy/ carb modified diet per MD's orders.  He has been able to tolerate his diet without s/sx of n/v or abdominal pain/ distention.     Problem: Elimination: Goal: Will not experience complications related to bowel motility Outcome: Progressing Pt LBM was in 08/14/2023.  He has not endorse c/o constipation.    Problem: Elimination: Goal: Will not experience complications related to urinary retention Outcome:  Progressing Pt has denied c/o dysuria or abdominal distention/ pain.   Problem: Pain Management: Goal: General experience of comfort will improve Outcome: Progressing Pt has endorsed c/o 5-8/10 right arm pain describing it as a constant dull, ache.  Reiterated pain scale so he could adequately rate his pain.  Pt stated his pain goal this admission 0/10.  Discussed nonpharmacological methods to help reduce s/sx of pain.  Interventions given per pt's request and MD's orders.    Problem: Safety: Goal: Ability to remain free from injury will improve Outcome: Progressing Pt has remained free from falls thus far.  Instructed pt to utilize RN call light for assistance.  Hourly rounds performed.  Bed alarm implemented to keep pt safe from falls.  Settings activated to third most sensitive mode.  Bed in lowest position, locked with two upper side rails engaged.  Belongings and call light within reach.    Problem: Skin Integrity: Goal: Risk for impaired skin integrity will decrease Outcome: Progressing Skin integrity monitored and assessed q-shift. Pt is on q2 hourly turns to prevent further skin impairment.  Tubes and drains assessed for device related pressure sores.  Pt is continent of both bowel and bladder.

## 2023-08-14 NOTE — Progress Notes (Signed)
Subjective: 2 Days Post-Op Procedure(s) (LRB): IRRIGATION AND DEBRIDEMENT RIGHT FOREARM (Right) Patient reports pain as mild.  Cultures positive for staph.  Started dressing/packing changes yesterday.  Normal WBC and CRP.  Objective: Vital signs in last 24 hours: Temp:  [97.8 F (36.6 C)-98 F (36.7 C)] 97.8 F (36.6 C) (08/19 0707) Pulse Rate:  [56-61] 57 (08/19 0707) Resp:  [16-20] 16 (08/19 0707) BP: (130-166)/(52-67) 154/52 (08/19 0707) SpO2:  [96 %-100 %] 98 % (08/19 0707)  Intake/Output from previous day: 08/18 0701 - 08/19 0700 In: 1442.3 [P.O.:990; IV Piggyback:452.3] Out: -  Intake/Output this shift: Total I/O In: 240 [P.O.:240] Out: -   Recent Labs    08/11/23 1649 08/12/23 0025 08/13/23 0933 08/14/23 0109  HGB 12.5* 12.4* 12.5* 12.3*   Recent Labs    08/13/23 0933 08/14/23 0109  WBC 5.0 4.6  RBC 3.87* 3.84*  HCT 37.1* 37.0*  PLT 157 138*   Recent Labs    08/11/23 1649 08/12/23 0025  NA 136 136  K 4.4 3.7  CL 102 104  CO2 22 21*  BUN 35* 28*  CREATININE 1.40* 1.24  GLUCOSE 90 91  CALCIUM 8.2* 8.1*   No results for input(s): "LABPT", "INR" in the last 72 hours.  Wound with packing in place.  Somewhat decreased erythema of surrounding skin.  Sensation and motor intact.  Assessment/Plan: 2 Days Post-Op Procedure(s) (LRB): IRRIGATION AND DEBRIDEMENT RIGHT FOREARM (Right)  Okay to d/c today on appropriate antibiotics for at least 3 weeks with medicine follow up (ID or PCP).  Patient will need instruction on daily packing changes and will need to perform daily at home.    Will follow up with me in approx 1 week.  Should remain weight bearing < 2 lbs on RUE.  Should work on finger and wrist ROM 3 times a day.    Mitchell Parker 08/14/2023, 1:06 PM

## 2023-08-14 NOTE — Discharge Summary (Signed)
Physician Discharge Summary   Mitchell Parker NWG:[id number] DOB: 1951-01-14 DOA: 08/11/2023  PCP: [name], MD  Admit date: 08/11/2023 Discharge date: 08/14/2023  Admitted From: Home Disposition:  Home Discharging physician: Lewie Chamber, MD Barriers to discharge: none  Recommendations at discharge: Follow up with orthopedic surgery Continue wound care  Discharge Condition: stable CODE STATUS: Full Diet recommendation:  Diet Orders (From admission, onward)     Start     Ordered   08/14/23 0000  Diet - low sodium heart healthy        08/14/23 1321   08/12/23 0620  Diet heart healthy/carb modified Room service appropriate? Yes; Fluid consistency: Thin  Diet effective now       Question Answer Comment  Diet-HS Snack? Nothing   Room service appropriate? Yes   Fluid consistency: Thin      08/12/23 5784            Hospital Course: Mitchell Parker  is a 72 y.o. male, with past medical history of diabetes mellitus, neuropathy, COPD, rheumatoid arthritis, sleep apnea, nonischemic cardiomyopathy, hypertension, CKD, CHF, A-fib status post Watchman procedure last February (finished his Eliquis then Plavix last dose on Friday) Patient presented to ED secondary to the worsening site of insect bite, patient initially with right forearm insect bite, he thinks is a spider as he was working in his yard, and he was cleaning some spider webs. He presented to ED initially 8/14 not improving on doxycycline given by his PCP but he did not improve after switching his doxycycline to Bactrim. He reported worsening redness, pain, swelling, and started to have drainage of pus. He was evaluated by orthopedic surgery and underwent right forearm excisional I&D on 08/12/2023.  Assessment and Plan:  Right arm wound infection -Continued to have worsening wound infection despite being treated with p.o. antibiotics -now s/p I & D with hand surgery on 08/12/23 - cultures noted with MRSA; sending home  on 3 week course of doxycyline - patient to follow up with orthopedic surgery and PCP after discharge   Paroxysmal A-fib -Not on anticoagulation anymore status post Watchman procedures -He finished his apixaban 6 weeks postprocedure, and he just finished his Plavix 75 mg daily for 6 months last week   Diabetes neuropathy -Continue duloxetine and gabapentin   Type 2 DM - resume home regimen    Rheumatoid arthritis -Continue Plaquenil   Hyperlipidemia -Continue statin   Thrombocytopenia -Chronic, at baseline   BPH - continue with flomax   CKd stage IIIb -renal function at baseline    History of subarachnoid hemorrhage and Subdural Hematoma -Has been in the past secondary to multiple falls, he is not on anticoagulation anymore he denies any recent falls   Hypertension -continue with home medication Coreg, clonidine and Azor   The patient's chronic medical conditions were treated accordingly per the patient's home medication regimen except as noted.  On day of discharge, patient was felt deemed stable for discharge. Patient/family member advised to call PCP or come back to ER if needed.   Principal Diagnosis: Wound infection  Discharge Diagnoses: Active Hospital Problems   Diagnosis Date Noted   Wound infection 08/11/2023   Atrial fibrillation (HCC) 02/02/2023   SDH (subdural hematoma) (HCC) 02/03/2022   Venous insufficiency of right leg 08/19/2020   Chronic diastolic congestive heart failure (HCC) 08/17/2017   Chronic renal insufficiency 04/17/2012   CHF (congestive heart failure) (HCC) 01/16/2012   COPD (chronic obstructive pulmonary disease) (HCC) 01/16/2012   Obesity, morbid (  HCC) 01/16/2012   OSA on CPAP 01/16/2012    Resolved Hospital Problems  No resolved problems to display.     Discharge Instructions     Diet - low sodium heart healthy   Complete by: As directed    Discharge wound care:   Complete by: As directed    daily dressing changes including  packing wound with iodoform. Gently pack with iodoform, cover with telfa, wrap with kerlix, then cover with ACE bandage.   Increase activity slowly   Complete by: As directed       Allergies as of 08/14/2023       Reactions   Ciprofloxacin Hives   Definity [perflutren Lipid Microsphere] Nausea And Vomiting   During ECHO with Definity injection pt became nauseated and vomited    Levofloxacin [levofloxacin] Hives        Medication List     STOP taking these medications    doxycycline 100 MG tablet Commonly known as: VIBRA-TABS Replaced by: doxycycline 100 MG capsule   sulfamethoxazole-trimethoprim 800-160 MG tablet Commonly known as: BACTRIM DS       TAKE these medications    acetaminophen 500 MG tablet Commonly known as: TYLENOL Take 500 mg by mouth every 6 (six) hours as needed for mild pain.   allopurinol 300 MG tablet Commonly known as: ZYLOPRIM Take 1 tablet (300 mg total) by mouth every morning.   amLODipine-olmesartan 10-40 MG tablet Commonly known as: AZOR Take 1 tablet by mouth daily.   atorvastatin 80 MG tablet Commonly known as: LIPITOR TAKE 1 TABLET BY MOUTH DAILY AT  6 PM   calcitRIOL 0.25 MCG capsule Commonly known as: ROCALTROL Take 1 capsule (0.25 mcg total) by mouth daily with breakfast.   calcium carbonate 1500 (600 Ca) MG Tabs tablet Commonly known as: OSCAL Take 600 mg of elemental calcium by mouth 2 (two) times daily with a meal.   carvedilol 6.25 MG tablet Commonly known as: COREG TAKE ONE-HALF TABLET BY MOUTH  EVERY MORNING AND 1 TABLET BY  MOUTH EVERY EVENING   celecoxib 200 MG capsule Commonly known as: CELEBREX TAKE 1 CAPSULE BY MOUTH DAILY  WITH FOOD   cloNIDine 0.2 MG tablet Commonly known as: CATAPRES TAKE 1 TABLET BY MOUTH TWICE  DAILY FOR BLOOD PRESSURE   Colchicine 0.6 MG Caps Take 1 capsule by mouth daily. What changed:  when to take this reasons to take this   doxycycline 100 MG capsule Commonly known as:  Vibramycin Take 1 capsule (100 mg total) by mouth 2 (two) times daily for 21 days. Replaces: doxycycline 100 MG tablet   DULoxetine 30 MG capsule Commonly known as: CYMBALTA TAKE 1 CAPSULE BY MOUTH TWICE  DAILY   ferrous sulfate 325 (65 FE) MG tablet Take 325 mg by mouth in the morning.   fluocinolone 0.01 % external solution Commonly known as: Synalar Place 3-5 drops in each ear canal twice daily What changed:  how much to take when to take this reasons to take this   furosemide 40 MG tablet Commonly known as: LASIX TAKE 1 TABLET BY MOUTH IN THE  MORNING   glimepiride 2 MG tablet Commonly known as: AMARYL Take 1 tablet (2 mg total) by mouth daily before breakfast.   hydroxychloroquine 200 MG tablet Commonly known as: PLAQUENIL TAKE 1 TABLET BY MOUTH IN THE  MORNING AND AT BEDTIME   multivitamin with minerals Tabs tablet Take 1 tablet by mouth in the morning.   oxyCODONE 5 MG immediate release tablet  Commonly known as: Oxy IR/ROXICODONE Take 1 tablet (5 mg total) by mouth every 4 (four) hours as needed for moderate pain, severe pain or breakthrough pain.   pioglitazone 30 MG tablet Commonly known as: Actos Take 1 tablet (30 mg total) by mouth daily.   potassium chloride SA 20 MEQ tablet Commonly known as: KLOR-CON M TAKE 1 TABLET BY MOUTH DAILY FOR POTASSIUM REPLACEMENT/  SUPPLEMENT   silver sulfADIAZINE 1 % cream Commonly known as: SILVADENE Apply topically as needed.   tamsulosin 0.4 MG Caps capsule Commonly known as: FLOMAX Take 1 capsule (0.4 mg total) by mouth at bedtime.   vitamin B-12 500 MCG tablet Commonly known as: CYANOCOBALAMIN Take 500 mcg by mouth in the morning.               Discharge Care Instructions  (From admission, onward)           Start     Ordered   08/14/23 0000  Discharge wound care:       Comments: daily dressing changes including packing wound with iodoform. Gently pack with iodoform, cover with telfa, wrap with  kerlix, then cover with ACE bandage.   08/14/23 1321            Follow-up Information     [name], MD. Schedule an appointment as soon as possible for a visit in 2 week(s).   Specialty: Family Medicine Contact information: [address] [place] [address] [phone]         Ramon Dredge, MD. Schedule an appointment as soon as possible for a visit in 1 week(s).   Specialties: Orthopedic Surgery, Hand Surgery Contact information: [address] [place] [address] [phone]                Allergies  Allergen Reactions   Ciprofloxacin Hives   Definity [Perflutren Lipid Microsphere] Nausea And Vomiting    During ECHO with Definity injection pt became nauseated and vomited    Levofloxacin [Levofloxacin] Hives    Consultations: Orthopedic surgery  Procedures: 08/12/2023: Right forearm excisional I&D  Discharge Exam: BP (!) 154/52 (BP Location: Right Arm)   Pulse (!) 57   Temp 97.8 F (36.6 C) (Oral)   Resp 16   Ht 6\' 1"  (1.854 m)   Wt (!) 136.5 kg   SpO2 98%   BMI 39.71 kg/m  Physical Exam Constitutional:      General: He is not in acute distress.    Appearance: Normal appearance.  HENT:     Head: Normocephalic and atraumatic.     Mouth/Throat:     Mouth: Mucous membranes are moist.  Eyes:     Extraocular Movements: Extraocular movements intact.  Cardiovascular:     Rate and Rhythm: Normal rate and regular rhythm.  Pulmonary:     Effort: Pulmonary effort is normal. No respiratory distress.     Breath sounds: Normal breath sounds. No wheezing.  Abdominal:     General: Bowel sounds are normal. There is no distension.     Palpations: Abdomen is soft.     Tenderness: There is no abdominal tenderness.  Musculoskeletal:        General: Normal range of motion.     Cervical back: Normal range of motion and neck supple.  Skin:    General: Skin is warm and dry.  Neurological:     General: No focal deficit present.      Mental Status: He is alert.  Psychiatric:  Mood and Affect: Mood normal.        Behavior: Behavior normal.      The results of significant diagnostics from this hospitalization (including imaging, microbiology, ancillary and laboratory) are listed below for reference.   Microbiology: Recent Results (from the past 240 hour(s))  Aerobic Culture w Gram Stain (superficial specimen)     Status: None   Collection Time: 08/09/23  1:01 PM   Specimen: Abscess  Result Value Ref Range Status   Specimen Description ABSCESS RIGHT ARM  Final   Special Requests NONE  Final   Gram Stain   Final    NO WBC SEEN NO ORGANISMS SEEN Performed at [facility], [address] [place], [place] [address]    Culture FEW METHICILLIN RESISTANT STAPHYLOCOCCUS AUREUS  Final   Report Status 08/12/2023 FINAL  Final   Organism ID, Bacteria METHICILLIN RESISTANT STAPHYLOCOCCUS AUREUS  Final      Susceptibility   Methicillin resistant staphylococcus aureus - MIC*    CIPROFLOXACIN <=0.5 SENSITIVE Sensitive     ERYTHROMYCIN >=8 RESISTANT Resistant     GENTAMICIN <=0.5 SENSITIVE Sensitive     OXACILLIN >=4 RESISTANT Resistant     TETRACYCLINE <=1 SENSITIVE Sensitive     VANCOMYCIN <=0.5 SENSITIVE Sensitive     TRIMETH/SULFA <=10 SENSITIVE Sensitive     CLINDAMYCIN <=0.25 SENSITIVE Sensitive     RIFAMPIN <=0.5 SENSITIVE Sensitive     Inducible Clindamycin NEGATIVE Sensitive     LINEZOLID 2 SENSITIVE Sensitive     * FEW METHICILLIN RESISTANT STAPHYLOCOCCUS AUREUS  Blood culture (routine x 2)     Status: None (Preliminary result)   Collection Time: 08/11/23  4:49 PM   Specimen: BLOOD  Result Value Ref Range Status   Specimen Description BLOOD BLOOD LEFT HAND  Final   Special Requests   Final    BOTTLES DRAWN AEROBIC AND ANAEROBIC Blood Culture results may not be optimal due to an excessive volume of blood received in culture bottles   Culture   Final    NO GROWTH 3 DAYS Performed at [facility], [address], [place], [place] [address]    Report Status PENDING  Incomplete  Blood culture (routine x 2)     Status: None (Preliminary result)   Collection Time: 08/11/23  4:49 PM   Specimen: BLOOD  Result Value Ref Range Status   Specimen Description BLOOD BLOOD RIGHT HAND  Final   Special Requests   Final    BOTTLES DRAWN AEROBIC AND ANAEROBIC Blood Culture results may not be optimal due to an excessive volume of blood received in culture bottles   Culture   Final    NO GROWTH 3 DAYS Performed at [facility], [address] [place], [place] [address]    Report Status PENDING  Incomplete  Aerobic/Anaerobic Culture w Gram Stain (surgical/deep wound)     Status: None (Preliminary result)   Collection Time: 08/12/23  3:30 AM   Specimen: Abscess  Result Value Ref Range Status   Specimen Description WOUND  Final   Special Requests RIGHT FOREARM ABSC SPEC A  Final   Gram Stain   Final    MODERATE WBC PRESENT,BOTH PMN AND MONONUCLEAR FEW GRAM POSITIVE COCCI IN CLUSTERS    Culture   Final    FEW STAPHYLOCOCCUS AUREUS MIC REPEAT (VITEK) Performed at [facility], [address] [place], [place] [address]    Report Status PENDING  Incomplete     Labs: BNP (last  3 results) No results for input(s): "BNP" in the last 8760 hours. Basic Metabolic Panel: Recent Labs  Lab 08/09/23 1038 08/11/23 1233 08/11/23 1649 08/12/23 0025  NA 135 136 136 136  K 4.2 4.5 4.4 3.7  CL 105 104 102 104  CO2 26 20* 22 21*  GLUCOSE 125* 123* 90 91  BUN 28* 35* 35* 28*  CREATININE 1.22 1.28* 1.40* 1.24  CALCIUM 8.6* 8.2* 8.2* 8.1*   Liver Function Tests: Recent Labs  Lab 08/09/23 1038 08/11/23 1233  AST 21 30  ALT 23 30  ALKPHOS 55 57  BILITOT 0.5 0.3  PROT 6.7 6.5  ALBUMIN 3.4* 3.4*   No results for input(s): "LIPASE", "AMYLASE" in the last 168 hours. No results for input(s): "AMMONIA" in the last 168 hours. CBC: Recent Labs  Lab 08/09/23 1038 08/11/23 1233  08/11/23 1649 08/12/23 0025 08/13/23 0933 08/14/23 0109  WBC 6.0 4.9 4.9 4.7 5.0 4.6  NEUTROABS 4.5 3.6  --   --  3.4 2.7  HGB 12.8* 12.7* 12.5* 12.4* 12.5* 12.3*  HCT 37.9* 38.1* 37.2* 37.0* 37.1* 37.0*  MCV 98.4 98.7 98.2 98.1 95.9 96.4  PLT 155 146* 147* 142* 157 138*   Cardiac Enzymes: No results for input(s): "CKTOTAL", "CKMB", "CKMBINDEX", "TROPONINI" in the last 168 hours. BNP: Invalid input(s): "POCBNP" CBG: Recent Labs  Lab 08/13/23 1128 08/13/23 1600 08/13/23 2051 08/14/23 0707 08/14/23 1218  GLUCAP 137* 111* 155* 118* 117*   D-Dimer No results for input(s): "DDIMER" in the last 72 hours. Hgb A1c Recent Labs    08/11/23 1649  HGBA1C 6.1*   Lipid Profile No results for input(s): "CHOL", "HDL", "LDLCALC", "TRIG", "CHOLHDL", "LDLDIRECT" in the last 72 hours. Thyroid function studies No results for input(s): "TSH", "T4TOTAL", "T3FREE", "THYROIDAB" in the last 72 hours.  Invalid input(s): "FREET3" Anemia work up No results for input(s): "VITAMINB12", "FOLATE", "FERRITIN", "TIBC", "IRON", "RETICCTPCT" in the last 72 hours. Urinalysis    Component Value Date/Time   COLORURINE YELLOW 08/09/2023 1050   APPEARANCEUR CLEAR 08/09/2023 1050   APPEARANCEUR Clear 10/08/2021 1421   LABSPEC 1.015 08/09/2023 1050   PHURINE 5.0 08/09/2023 1050   GLUCOSEU NEGATIVE 08/09/2023 1050   HGBUR NEGATIVE 08/09/2023 1050   BILIRUBINUR NEGATIVE 08/09/2023 1050   BILIRUBINUR Negative 10/08/2021 1421   KETONESUR NEGATIVE 08/09/2023 1050   PROTEINUR NEGATIVE 08/09/2023 1050   UROBILINOGEN 0.2 10/08/2012 1053   NITRITE NEGATIVE 08/09/2023 1050   LEUKOCYTESUR NEGATIVE 08/09/2023 1050   Sepsis Labs Recent Labs  Lab 08/11/23 1649 08/12/23 0025 08/13/23 0933 08/14/23 0109  WBC 4.9 4.7 5.0 4.6   Microbiology Recent Results (from the past 240 hour(s))  Aerobic Culture w Gram Stain (superficial specimen)     Status: None   Collection Time: 08/09/23  1:01 PM   Specimen:  Abscess  Result Value Ref Range Status   Specimen Description ABSCESS RIGHT ARM  Final   Special Requests NONE  Final   Gram Stain   Final    NO WBC SEEN NO ORGANISMS SEEN Performed at [facility], [address], [place], [place] [address]    Culture FEW METHICILLIN RESISTANT STAPHYLOCOCCUS AUREUS  Final   Report Status 08/12/2023 FINAL  Final   Organism ID, Bacteria METHICILLIN RESISTANT STAPHYLOCOCCUS AUREUS  Final      Susceptibility   Methicillin resistant staphylococcus aureus - MIC*    CIPROFLOXACIN <=0.5 SENSITIVE Sensitive     ERYTHROMYCIN >=8 RESISTANT Resistant     GENTAMICIN <=0.5 SENSITIVE Sensitive  OXACILLIN >=4 RESISTANT Resistant     TETRACYCLINE <=1 SENSITIVE Sensitive     VANCOMYCIN <=0.5 SENSITIVE Sensitive     TRIMETH/SULFA <=10 SENSITIVE Sensitive     CLINDAMYCIN <=0.25 SENSITIVE Sensitive     RIFAMPIN <=0.5 SENSITIVE Sensitive     Inducible Clindamycin NEGATIVE Sensitive     LINEZOLID 2 SENSITIVE Sensitive     * FEW METHICILLIN RESISTANT STAPHYLOCOCCUS AUREUS  Blood culture (routine x 2)     Status: None (Preliminary result)   Collection Time: 08/11/23  4:49 PM   Specimen: BLOOD  Result Value Ref Range Status   Specimen Description BLOOD BLOOD LEFT HAND  Final   Special Requests   Final    BOTTLES DRAWN AEROBIC AND ANAEROBIC Blood Culture results may not be optimal due to an excessive volume of blood received in culture bottles   Culture   Final    NO GROWTH 3 DAYS Performed at [facility], [address], [place], [place] [address]    Report Status PENDING  Incomplete  Blood culture (routine x 2)     Status: None (Preliminary result)   Collection Time: 08/11/23  4:49 PM   Specimen: BLOOD  Result Value Ref Range Status   Specimen Description BLOOD BLOOD RIGHT HAND  Final   Special Requests   Final    BOTTLES DRAWN AEROBIC AND ANAEROBIC Blood Culture results may not be optimal due to an excessive volume of blood received in culture bottles    Culture   Final    NO GROWTH 3 DAYS Performed at [facility], [address] [place], [place] [address]    Report Status PENDING  Incomplete  Aerobic/Anaerobic Culture w Gram Stain (surgical/deep wound)     Status: None (Preliminary result)   Collection Time: 08/12/23  3:30 AM   Specimen: Abscess  Result Value Ref Range Status   Specimen Description WOUND  Final   Special Requests RIGHT FOREARM ABSC SPEC A  Final   Gram Stain   Final    MODERATE WBC PRESENT,BOTH PMN AND MONONUCLEAR FEW GRAM POSITIVE COCCI IN CLUSTERS    Culture   Final    FEW STAPHYLOCOCCUS AUREUS MIC REPEAT (VITEK) Performed at [facility], [address], [place], [place] [address]    Report Status PENDING  Incomplete    Procedures/Studies: DG Forearm Right  Result Date: 08/09/2023 CLINICAL DATA:  Possible spider bite on Saturday. Progressive redness, pain and swelling. Evaluate for cortical irregularity. EXAM: RIGHT FOREARM - 2 VIEW COMPARISON:  None Available. FINDINGS: Skin thickening and soft tissue edema is noted along the volar surface of the mid and distal forearm. The underlying osseous structures appear intact. There is no sign of acute fracture, dislocation or bone erosion to suggest osteomyelitis. Degenerative changes noted at the radiocarpal joint. Vascular calcifications identified. IMPRESSION: 1. Skin thickening and soft tissue edema along the volar surface of the mid and distal forearm. 2. No signs of osteomyelitis. Electronically Signed   By: Signa Kell M.D.   On: 08/09/2023 12:56     Time coordinating discharge: Over 30 minutes    Lewie Chamber, MD  [facility] 08/14/2023, 1:23 PM

## 2023-08-14 NOTE — Progress Notes (Signed)
Pt to be d/c home to self care. Reviewed AVS with pt, changes to his medication list and made him aware of f/u appointments with his PCP. Informed pt that he had pending prescriptions to be picked up from his preferred pharmacy.  Answered any pending questions. Pt had no further questions. Removed PIV which was CDI and free from s/sx of infection upon removal. Helped pt in gathering his belongings and getting dressed. Pt is ambulatory and walked to the front of the hospital accompanied by his wife where his ride awaits to take him home. Pt d/c in stable condition.

## 2023-08-14 NOTE — Hospital Course (Signed)
Donya Shahan  is a 72 y.o. male, with past medical history of diabetes mellitus, neuropathy, COPD, rheumatoid arthritis, sleep apnea, nonischemic cardiomyopathy, hypertension, CKD, CHF, A-fib status post Watchman procedure last February (finished his Eliquis then Plavix last dose on Friday) Patient presented to ED secondary to the worsening site of insect bite, patient initially with right forearm insect bite, he thinks is a spider as he was working in his yard, and he was cleaning some spider webs. He presented to ED initially 8/14 not improving on doxycycline given by his PCP but he did not improve after switching his doxycycline to Bactrim. He reported worsening redness, pain, swelling, and started to have drainage of pus. He was evaluated by orthopedic surgery and underwent right forearm excisional I&D on 08/12/2023.

## 2023-08-15 ENCOUNTER — Other Ambulatory Visit: Payer: Self-pay | Admitting: Family Medicine

## 2023-08-15 ENCOUNTER — Telehealth: Payer: Self-pay

## 2023-08-15 DIAGNOSIS — N400 Enlarged prostate without lower urinary tract symptoms: Secondary | ICD-10-CM

## 2023-08-15 LAB — ACID FAST SMEAR (AFB, MYCOBACTERIA): Acid Fast Smear: NEGATIVE

## 2023-08-15 NOTE — Transitions of Care (Post Inpatient/ED Visit) (Signed)
08/15/2023  Name: Mitchell LAUDON Sr. MRN: [id number] DOB: Apr 27, 1951  Today's TOC FU Call Status: Today's TOC FU Call Status:: Successful TOC FU Call Completed TOC FU Call Complete Date: 08/15/23  Transition Care Management Follow-up Telephone Call Date of Discharge: 08/14/23 Discharge Facility: [facility]) Type of Discharge: Inpatient Admission Primary Inpatient Discharge Diagnosis:: Right Forearm Wound Infection How have you been since you were released from the hospital?: Better Any questions or concerns?: No  Items Reviewed: Did you receive and understand the discharge instructions provided?: Yes Medications obtained,verified, and reconciled?: Partial Review Completed Reason for Partial Mediation Review: Reviewed discharge medications Any new allergies since your discharge?: No Dietary orders reviewed?: No Do you have support at home?: Yes People in Home: spouse Name of Support/Comfort Primary Source: Mitchell Parker  Medications Reviewed Today: Medications Reviewed Today     Reviewed by Jodelle Gross, RN (Case Manager) on 08/15/23 at 1457  Med List Status: <None>   Medication Order Taking? Sig Documenting Provider Last Dose Status Informant  acetaminophen (TYLENOL) 500 MG tablet [id number]  Take 500 mg by mouth every 6 (six) hours as needed for mild pain. [provider]  Active Self, Spouse/Significant Other  allopurinol (ZYLOPRIM) 300 MG tablet [id number]  Take 1 tablet (300 mg total) by mouth every morning. [name], MD  Active Self, Spouse/Significant Other  amLODipine-olmesartan (AZOR) 10-40 MG tablet [id number]  Take 1 tablet by mouth daily. [name], MD  Active Self, Spouse/Significant Other  atorvastatin (LIPITOR) 80 MG tablet [id number]  TAKE 1 TABLET BY MOUTH DAILY AT  6 PM Stacks, Broadus John, MD  Active Self, Spouse/Significant Other  calcitRIOL (ROCALTROL) 0.25 MCG capsule [id number]  Take 1 capsule (0.25 mcg total) by mouth daily with breakfast.  [name], MD  Active Self, Spouse/Significant Other  calcium carbonate (OSCAL) 1500 (600 Ca) MG TABS tablet [id number]  Take 600 mg of elemental calcium by mouth 2 (two) times daily with a meal. [provider]  Active Self, Spouse/Significant Other  carvedilol (COREG) 6.25 MG tablet [id number]  TAKE ONE-HALF TABLET BY MOUTH  EVERY MORNING AND 1 TABLET BY  MOUTH EVERY Nadeen Landau, MD  Active Self, Spouse/Significant Other  celecoxib (CELEBREX) 200 MG capsule [id number]  TAKE 1 CAPSULE BY MOUTH DAILY  WITH FOOD [name], MD  Active Self, Spouse/Significant Other  cloNIDine (CATAPRES) 0.2 MG tablet [id number]  TAKE 1 TABLET BY MOUTH TWICE  DAILY FOR BLOOD PRESSURE [name], MD  Active Self, Spouse/Significant Other  Colchicine 0.6 MG CAPS [id number]  Take 1 capsule by mouth daily.  Patient taking differently: Take 1 capsule by mouth as needed (gout flareups).   [name], MD  Active Self, Spouse/Significant Other  doxycycline (VIBRAMYCIN) 100 MG capsule [id number] Yes Take 1 capsule (100 mg total) by mouth 2 (two) times daily for 21 days. Lewie Chamber, MD Taking Active   DULoxetine (CYMBALTA) 30 MG capsule [id number]  TAKE 1 CAPSULE BY MOUTH TWICE  DAILY Stacks, Broadus John, MD  Active Self, Spouse/Significant Other  ferrous sulfate 325 (65 FE) MG tablet [id number]  Take 325 mg by mouth in the morning. [provider]  Active Self, Spouse/Significant Other  fluocinolone (SYNALAR) 0.01 % external solution [id number]  Place 3-5 drops in each ear canal twice daily  Patient taking differently: 3-5 drops 2 (two) times daily as needed (irritation/infection.). Place 3-5 drops in each ear canal twice daily   Stacks, Broadus John, MD  Active Self, Spouse/Significant Other  furosemide (LASIX) 40 MG tablet [id number]  TAKE  1 TABLET BY MOUTH IN THE  Henrietta Hoover, Broadus John, MD  Active Self, Spouse/Significant Other  glimepiride (AMARYL) 2 MG tablet [id number]  Take 1 tablet (2 mg  total) by mouth daily before breakfast. [name], MD  Active Self, Spouse/Significant Other  hydroxychloroquine (PLAQUENIL) 200 MG tablet [id number]  TAKE 1 TABLET BY MOUTH IN THE  MORNING AND AT BEDTIME [name], MD  Active Self, Spouse/Significant Other  Multiple Vitamin (MULTIVITAMIN WITH MINERALS) TABS tablet [id number]  Take 1 tablet by mouth in the morning. [provider]  Active Self, Spouse/Significant Other  oxyCODONE (OXY IR/ROXICODONE) 5 MG immediate release tablet [id number] Yes Take 1 tablet (5 mg total) by mouth every 4 (four) hours as needed for moderate pain, severe pain or breakthrough pain. Lewie Chamber, MD Taking Active   pioglitazone (ACTOS) 30 MG tablet [id number]  Take 1 tablet (30 mg total) by mouth daily. [name], MD  Active Self, Spouse/Significant Other  potassium chloride SA (KLOR-CON M) 20 MEQ tablet [id number]  TAKE 1 TABLET BY MOUTH DAILY FOR POTASSIUM REPLACEMENT/  SUPPLEMENT Stacks, Broadus John, MD  Active Self, Spouse/Significant Other  silver sulfADIAZINE (SILVADENE) 1 % cream [id number]  Apply topically as needed. [provider]  Active Self, Spouse/Significant Other  tamsulosin (FLOMAX) 0.4 MG CAPS capsule [id number]  Take 1 capsule (0.4 mg total) by mouth at bedtime. [name], MD  Active Self, Spouse/Significant Other  vitamin B-12 (CYANOCOBALAMIN) 500 MCG tablet [id number]  Take 500 mcg by mouth in the morning. [provider]  Active Self, Spouse/Significant Other            Home Care and Equipment/Supplies: Were Home Health Services Ordered?: No Any new equipment or medical supplies ordered?: No  Functional Questionnaire: Do you need assistance with bathing/showering or dressing?: No Do you need assistance with meal preparation?: No Do you need assistance with eating?: No Do you have difficulty maintaining continence: No Do you need assistance with getting out of bed/getting out of a chair/moving?: No Do you  have difficulty managing or taking your medications?: No  Follow up appointments reviewed: PCP Follow-up appointment confirmed?: Yes Date of PCP follow-up appointment?: 08/24/23 Follow-up Provider: Harlow Mares, NP Specialist Hospital Follow-up appointment confirmed?: Yes Date of Specialist follow-up appointment?: 08/21/23 Follow-Up Specialty Provider:: Dr. Brandt Loosen ([facility]) Do you need transportation to your follow-up appointment?: No Do you understand care options if your condition(s) worsen?: Yes-patient verbalized understanding  SDOH Interventions Today    Flowsheet Row Most Recent Value  SDOH Interventions   Food Insecurity Interventions Intervention Not Indicated  Housing Interventions Intervention Not Indicated  Utilities Interventions Intervention Not Indicated      TOC Interventions Today    Flowsheet Row Most Recent Value  TOC Interventions   TOC Interventions Discussed/Reviewed TOC Interventions Discussed, TOC Interventions Reviewed, Arranged PCP follow up within 7 days/Care Guide scheduled       Jodelle Gross, RN, BSN, CCM Care Management Coordinator [facility] Phone: [phone]/Fax: [phone]

## 2023-08-16 LAB — CULTURE, BLOOD (ROUTINE X 2)
Culture: NO GROWTH
Culture: NO GROWTH

## 2023-08-17 LAB — AEROBIC/ANAEROBIC CULTURE W GRAM STAIN (SURGICAL/DEEP WOUND)

## 2023-08-18 ENCOUNTER — Inpatient Hospital Stay: Payer: No Typology Code available for payment source | Admitting: Oncology

## 2023-08-18 DIAGNOSIS — D696 Thrombocytopenia, unspecified: Secondary | ICD-10-CM | POA: Diagnosis not present

## 2023-08-18 DIAGNOSIS — E611 Iron deficiency: Secondary | ICD-10-CM | POA: Diagnosis not present

## 2023-08-18 NOTE — Progress Notes (Signed)
VIRTUAL VISIT via VIDEO  Cancer Center at [facility]   I connected with Mitchell Rod Sr.  on 08/18/23 at 8:30 AM by video and verified that I am speaking with the correct person using two identifiers.  Location: [facility] Provider: Home Office   I discussed the limitations, risks, security and privacy concerns of performing an evaluation and management service by virtual/video visit and the availability of in person appointments. I also discussed with the patient that there may be a patient responsible charge related to this service. The patient expressed understanding and agreed to proceed.  REASON FOR VISIT:  Follow-up for thrombocytopenia and splenomegaly  PRIOR THERAPY: None  CURRENT THERAPY: Surveillance  INTERVAL HISTORY:   Mitchell Parker 72 y.o. male returns for routine follow-up of thrombocytopenia and splenomegaly.  He was last seen by Mitchell Brenner, PA on 02/16/2023 for follow-up.  Patient is a 72 year old male with past medical history significant for diabetes mellitus, neuropathy, COPD, rheumatoid arthritis, sleep apnea, nonischemic cardiomyopathy, hypertension, CKD, CHF, A-fib status post Watchman procedure last February recently completed 6 weeks of Eliquis then 6 months of Plavix.  He is currently not on either.  In the interim, he was hospitalized for insect bite. Reports he thinks he was bit by a spider to his right forearm while he was working in his yard, cleaning some spider webs. He presented to ED initially 8/14 not improving on doxycycline given by his PCP but he did not improve after switching his doxycycline to Bactrim. He reported worsening redness, pain, swelling, and started to have drainage of pus. He was evaluated by orthopedic surgery and underwent right forearm excisional I&D on 08/12/2023.  He was discharged with doxycycline x 3 weeks.  At today's visit, he reports feeling poorly.  States the doxycycline has made him feel  tired with little to no appetite.  Denies any gastrointestinal concerns.  Denies any easy bruising or bleeding. He has not noticed any new lumps or bumps.  He denies any unexplained fevers, chills, night sweats, or weight loss.  He reports mild fatigue.  Had 1 episode of dizziness while lying flat on his back working on his car where the "whole world was spinning" and lasted about 10 minutes.  States this is not happened since.  He has chronic insomnia and numbness in bilateral lower extremities.  He takes Cymbalta and gabapentin.   He has reports appetite at 50% and energy levels are 75%.   ASSESSMENT & PLAN:  1.  Mild thrombocytopenia and splenomegaly: - Mild to moderate thrombocytopenia since October 2013. - US abdomen (12/13/2022): Spleen size 13.4 cm, spleen volume 982 cm cube.  Fatty liver. - Hematology workup (01/13/2023): Borderline low ferritin (33), with normal iron saturation. Normal CRP and ESR.  RF and ANA negative. Labs consistent with prior hepatitis B infection.  No evidence of hepatitis C. Mildly elevated kappa free light chains with ratio 1.92, likely secondary to CKD stage IIIb.  SPEP and immunofixation were negative. Normal copper, MMA, B12, folate. Normal LDH.  Normal reticulocytes. Immature platelet fraction 2.2% - Most recent CBC/D from 08/11/2023 shows a platelet count of 146. -Thrombocytopenia etiology likely secondary to mild splenomegaly, nutritional deficiency secondary to gastric sleeve surgery or underlying ITP or bone marrow infiltrative process such as low-grade MDS. -Discussed continuing to monitor at this time. - PLAN:  -No obvious cause of thrombocytopenia based on lab work.   -Suspect thrombocytopenia secondary to splenomegaly. - We will see him for  follow-up visit in 3 months for labs only and 6 months for follow-up.   2.  Iron deficiency (without anemia) - Iron studies from 08/11/2023 show ferritin of 145 with an iron saturation of 31%.  Hemoglobin was  12.7. - Patient reports fatigue which is likely multifactorial given recent I&D due to insect bite and being on doxycycline. -No evidence of iron deficiency at this time.  - PLAN:  -Likely has some malabsorption in the setting of prior bariatric surgery.   -Based on labs from 08/11/2023, no additional IV iron needed at this time.  3.  History of DVT and PE - Reports history of unprovoked PE and left leg DVT, was treated with warfarin x 5 years - No longer on anticoagulation due to history of subdural hematoma in February 2023, s/p burr holes placed in May 2023   4.  Rheumatoid arthritis: - Predominantly affects hands, diagnosed in 2010. - Has been on Plaquenil, not optimally controlled per patient.   5.  Social/family history: - Other PMH: Subdural hematoma x 2 secondary to traumatic falls, requiring bur hole evacuation.  DVT.  CKD stage IIIb.  COPD, hypertension, CHF, NICM, obesity, sleep apnea, type 2 diabetes mellitus, paroxysmal atrial fibrillation/flutter History of left leg DVT and unprovoked PE 10 years ago, was treated with 5 years of warfarin.  Subdural hematoma in February 2023, bur holes in May 2023. Lost 100 pounds in the last 4 years after gastric sleeve surgery. - Lives at home with his wife.  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  Quit smoking in 1995.  Smoked 2 pack/day for 25 years. - No family history of cancer or leukemia.  Mother had low platelets. - Served in the [employer] and stationed in [place] in [place].  He was also in [place] [employer] in [place].   6.  Insect bite (brown recluse): -Presented to urgent care on 08/08/23 for insect bite to right forearm. -Started on doxycycline but symptoms worsened.  Doxycycline was discontinued and he was started on Bactrim but symptoms were progressive and he was sent to the ED. -Required I&D to right forearm on 08/12/2023 and IV antibiotics. -He was discharged home on doxycycline x 3 weeks.   PLAN  SUMMARY: >>No additional iron needed at this time. >> Continue doxycycline.  >> Return to clinic in 3 months for lab only.  >> Return to clinic in 6 months for labs and see a provider.     REVIEW OF SYSTEMS:   Review of Systems  Constitutional:  Positive for fatigue.  Skin:  Positive for wound (Right forearm).  Neurological:  Positive for dizziness and numbness.  Psychiatric/Behavioral:  Positive for sleep disturbance.      PHYSICAL EXAM: (Per limitations of video visit):   The patient is alert and oriented x 3, exhibiting adequate mentation, good mood, and ability to speak in full sentences and execute sound judgement.    PAST MEDICAL/SURGICAL HISTORY:  Past Medical History:  Diagnosis Date   Chronic diarrhea    CKD (chronic kidney disease) stage 3, GFR 30-59 ml/min (HCC)    COPD (chronic obstructive pulmonary disease) (HCC)    Dysrhythmia 02/2022   paroxsymal SVT & aflutter 02/2022 XioXT   Essential hypertension    Gallstones    Gout    History of CHF (congestive heart failure)    History of colonic polyps    History of DVT (deep vein thrombosis) 04/19/2012   BLE DVT 04/09/12   Iron deficiency 02/16/2023  Kidney stones    Lumbar disc disease    Neuropathy    Nonischemic cardiomyopathy (HCC) 2003   Minor coronary atherosclerosis at cardiac catheterization 2003, LVEF initially 20% with normalization   Obesity    Orthostatic hypotension    Presence of Watchman left atrial appendage closure device 02/02/2023   27mm Watchman placed by Dr. Excell Seltzer   RA (rheumatoid arthritis) [facility])    Sleep apnea    Subarachnoid hemorrhage (HCC) 02/03/2022   Traumatic   Subdural hematoma (HCC) 02/03/2022   Traumatic   Syncope, vasovagal    Type 2 diabetes mellitus [facility])    Past Surgical History:  Procedure Laterality Date   BARIATRIC SURGERY  01/11/2018   BURR HOLE Right 05/04/2022   Procedure: BURR HOLES FOR Subdural Hematoma;  Surgeon: Bedelia Person, MD;  Location: [facility] OR;   Service: Neurosurgery;  Laterality: Right;   CHOLECYSTECTOMY  01/18/2012   Procedure: LAPAROSCOPIC CHOLECYSTECTOMY WITH INTRAOPERATIVE CHOLANGIOGRAM;  Surgeon: Clovis Pu. Cornett, MD;  Location: [facility] ORS;  Service: General;  Laterality: N/A;   COLONOSCOPY  05/2017   [facility]: five polyps removed but only 3 retrieved due to bowel prep, pathology with tubular adenomas   EYE SURGERY Right    Film over retina was removed.   I & D EXTREMITY Right 08/12/2023   Procedure: IRRIGATION AND DEBRIDEMENT RIGHT FOREARM;  Surgeon: Ramon Dredge, MD;  Location: [facility] OR;  Service: Orthopedics;  Laterality: Right;   LEFT ATRIAL APPENDAGE OCCLUSION N/A 02/02/2023   Procedure: LEFT ATRIAL APPENDAGE OCCLUSION;  Surgeon: Tonny Bollman, MD;  Location: [facility] INVASIVE CV LAB;  Service: Cardiovascular;  Laterality: N/A;   PILONIDAL CYST EXCISION  1997   TEE WITHOUT CARDIOVERSION N/A 02/02/2023   Procedure: TRANSESOPHAGEAL ECHOCARDIOGRAM;  Surgeon: Tonny Bollman, MD;  Location: [facility] INV[facility] CV LAB;  Service: Cardiovascular;  Laterality: N/A;   TOE AMPUTATION Right    3 toes on right foot- big toe and next 2   UMBILICAL HERNIA REPAIR     VARICOSE VEIN SURGERY     left leg   watchman implant      SOCIAL HISTORY:  Social History   Socioeconomic History   Marital status: Married    Spouse name: Claris Che   Number of children: 2   Years of education: Not on file   Highest education level: Some college, no degree  Occupational History   Occupation: retired  Tobacco Use   Smoking status: Former    Current packs/day: 0.00    Average packs/day: 2.0 packs/day for 25.0 years (50.0 ttl pk-yrs)    Types: Cigarettes    Start date: 12/26/1969    Quit date: 12/26/1994    Years since quitting: 28.6   Smokeless tobacco: Never  Vaping Use   Vaping status: Never Used  Substance and Sexual Activity   Alcohol use: No   Drug use: No   Sexual activity: Not Currently  Other Topics Concern   Not on file   Social History Narrative   Lives at home with wife, and has two adult children. Still ambulatory, no cane or walker.    Works 30 hours per week at [employer]   Has VA benefits - goes to [place] once or twice a year for vision, hearing, screenings, vaccines, etc.   Social Determinants of Health   Financial Resource Strain: Low Risk  (04/01/2023)   Overall Financial Resource Strain (CARDIA)    Difficulty of Paying Living Expenses: Not very hard  Food Insecurity: No Food Insecurity (08/15/2023)  Hunger Vital Sign    Worried About Running Out of Food in the Last Year: Never true    Ran Out of Food in the Last Year: Never true  Transportation Needs: No Transportation Needs (08/11/2023)   PRAPARE - Administrator, Civil Service (Medical): No    Lack of Transportation (Non-Medical): No  Physical Activity: Insufficiently Active (04/01/2023)   Exercise Vital Sign    Days of Exercise per Week: 3 days    Minutes of Exercise per Session: 20 min  Stress: No Stress Concern Present (04/01/2023)   [employer] of Occupational Health - Occupational Stress Questionnaire    Feeling of Stress : Not at all  Social Connections: Moderately Isolated (04/01/2023)   Social Connection and Isolation Panel [NHANES]    Frequency of Communication with Friends and Family: Once a week    Frequency of Social Gatherings with Friends and Family: Once a week    Attends Religious Services: More than 4 times per year    Active Member of [employer] or Organizations: No    Attends Engineer, structural: Not on file    Marital Status: Married  Catering manager Violence: Not At Risk (08/11/2023)   Humiliation, Afraid, Rape, and Kick questionnaire    Fear of Current or Ex-Partner: No    Emotionally Abused: No    Physically Abused: No    Sexually Abused: No    FAMILY HISTORY:  Family History  Problem Relation Age of Onset   Hypertension Mother    Stroke Mother    Diabetes Father    Hypertension Father    Heart  disease Father    Diabetes Brother    Colon cancer Neg Hx     CURRENT MEDICATIONS:  Outpatient Encounter Medications as of 08/18/2023  Medication Sig   acetaminophen (TYLENOL) 500 MG tablet Take 500 mg by mouth every 6 (six) hours as needed for mild pain.   allopurinol (ZYLOPRIM) 300 MG tablet Take 1 tablet (300 mg total) by mouth every morning.   amLODipine-olmesartan (AZOR) 10-40 MG tablet Take 1 tablet by mouth daily.   atorvastatin (LIPITOR) 80 MG tablet TAKE 1 TABLET BY MOUTH DAILY AT  6 PM   calcitRIOL (ROCALTROL) 0.25 MCG capsule Take 1 capsule (0.25 mcg total) by mouth daily with breakfast.   calcium carbonate (OSCAL) 1500 (600 Ca) MG TABS tablet Take 600 mg of elemental calcium by mouth 2 (two) times daily with a meal.   carvedilol (COREG) 6.25 MG tablet TAKE ONE-HALF TABLET BY MOUTH  EVERY MORNING AND 1 TABLET BY  MOUTH EVERY EVENING   celecoxib (CELEBREX) 200 MG capsule TAKE 1 CAPSULE BY MOUTH DAILY  WITH FOOD   cloNIDine (CATAPRES) 0.2 MG tablet TAKE 1 TABLET BY MOUTH TWICE  DAILY FOR BLOOD PRESSURE   Colchicine 0.6 MG CAPS Take 1 capsule by mouth daily. (Patient taking differently: Take 1 capsule by mouth as needed (gout flareups).)   doxycycline (VIBRAMYCIN) 100 MG capsule Take 1 capsule (100 mg total) by mouth 2 (two) times daily for 21 days.   DULoxetine (CYMBALTA) 30 MG capsule TAKE 1 CAPSULE BY MOUTH TWICE  DAILY   ferrous sulfate 325 (65 FE) MG tablet Take 325 mg by mouth in the morning.   fluocinolone (SYNALAR) 0.01 % external solution Place 3-5 drops in each ear canal twice daily (Patient taking differently: 3-5 drops 2 (two) times daily as needed (irritation/infection.). Place 3-5 drops in each ear canal twice daily)   furosemide (LASIX) 40 MG  tablet TAKE 1 TABLET BY MOUTH IN THE  MORNING   glimepiride (AMARYL) 2 MG tablet Take 1 tablet (2 mg total) by mouth daily before breakfast.   hydroxychloroquine (PLAQUENIL) 200 MG tablet TAKE 1 TABLET BY MOUTH IN THE  MORNING AND  AT BEDTIME   Multiple Vitamin (MULTIVITAMIN WITH MINERALS) TABS tablet Take 1 tablet by mouth in the morning.   oxyCODONE (OXY IR/ROXICODONE) 5 MG immediate release tablet Take 1 tablet (5 mg total) by mouth every 4 (four) hours as needed for moderate pain, severe pain or breakthrough pain.   pioglitazone (ACTOS) 30 MG tablet Take 1 tablet (30 mg total) by mouth daily.   potassium chloride SA (KLOR-CON M) 20 MEQ tablet TAKE 1 TABLET BY MOUTH DAILY FOR POTASSIUM REPLACEMENT/  SUPPLEMENT   silver sulfADIAZINE (SILVADENE) 1 % cream Apply topically as needed.   tamsulosin (FLOMAX) 0.4 MG CAPS capsule TAKE 1 CAPSULE BY MOUTH AT  BEDTIME   vitamin B-12 (CYANOCOBALAMIN) 500 MCG tablet Take 500 mcg by mouth in the morning.   No facility-administered encounter medications on file as of 08/18/2023.    ALLERGIES:  Allergies  Allergen Reactions   Ciprofloxacin Hives   Definity [Perflutren Lipid Microsphere] Nausea And Vomiting    During ECHO with Definity injection pt became nauseated and vomited    Levofloxacin [Levofloxacin] Hives    LABORATORY DATA:  I have reviewed the labs as listed.  CBC    Component Value Date/Time   WBC 4.6 08/14/2023 0109   RBC 3.84 (L) 08/14/2023 0109   HGB 12.3 (L) 08/14/2023 0109   HGB 13.4 07/05/2023 1007   HCT 37.0 (L) 08/14/2023 0109   HCT 40.5 07/05/2023 1007   PLT 138 (L) 08/14/2023 0109   PLT 114 (L) 07/05/2023 1007   MCV 96.4 08/14/2023 0109   MCV 98 (H) 07/05/2023 1007   MCH 32.0 08/14/2023 0109   MCHC 33.2 08/14/2023 0109   RDW 13.4 08/14/2023 0109   RDW 12.8 07/05/2023 1007   LYMPHSABS 1.0 08/14/2023 0109   LYMPHSABS 0.6 (L) 07/05/2023 1007   MONOABS 0.5 08/14/2023 0109   EOSABS 0.3 08/14/2023 0109   EOSABS 0.3 07/05/2023 1007   BASOSABS 0.0 08/14/2023 0109   BASOSABS 0.0 07/05/2023 1007      Latest Ref Rng & Units 08/12/2023   12:25 AM 08/11/2023    4:49 PM 08/11/2023   12:33 PM  CMP  Glucose 70 - 99 mg/dL 91  90  253   BUN 8 - 23 mg/dL  28  35  35   Creatinine 0.61 - 1.24 mg/dL 6.64  4.03  4.74   Sodium 135 - 145 mmol/L 136  136  136   Potassium 3.5 - 5.1 mmol/L 3.7  4.4  4.5   Chloride 98 - 111 mmol/L 104  102  104   CO2 22 - 32 mmol/L 21  22  20    Calcium 8.9 - 10.3 mg/dL 8.1  8.2  8.2   Total Protein 6.5 - 8.1 g/dL   6.5   Total Bilirubin 0.3 - 1.2 mg/dL   0.3   Alkaline Phos 38 - 126 U/L   57   AST 15 - 41 U/L   30   ALT 0 - 44 U/L   30     DIAGNOSTIC IMAGING:  I have independently reviewed the relevant imaging and discussed with the patient.   WRAP UP:  I discussed the assessment and treatment plan with the patient. The patient was provided an  opportunity to ask questions and all were answered. The patient agreed with the plan and demonstrated an understanding of the instructions.   The patient was advised to call or seek an in-person evaluation if the symptoms worsen or if the condition fails to improve as anticipated.  Medical decision making: moderate  Time spent on visit:I provided 25 minutes of non face-to-face telephone visit time during this encounter, and > 50% was spent counseling as documented under my assessment & plan.    Durenda Hurt, NP 08/18/2023 2:42 PM

## 2023-08-21 ENCOUNTER — Inpatient Hospital Stay: Payer: Medicare Other | Admitting: Family

## 2023-08-24 ENCOUNTER — Ambulatory Visit (INDEPENDENT_AMBULATORY_CARE_PROVIDER_SITE_OTHER): Payer: Medicare Other | Admitting: Family Medicine

## 2023-08-24 ENCOUNTER — Encounter: Payer: Self-pay | Admitting: Hematology

## 2023-08-24 ENCOUNTER — Encounter: Payer: Self-pay | Admitting: Family Medicine

## 2023-08-24 VITALS — BP 102/54 | HR 51 | Temp 98.0°F | Ht 73.0 in | Wt 312.4 lb

## 2023-08-24 DIAGNOSIS — I10 Essential (primary) hypertension: Secondary | ICD-10-CM

## 2023-08-24 DIAGNOSIS — L03113 Cellulitis of right upper limb: Secondary | ICD-10-CM | POA: Diagnosis not present

## 2023-08-24 DIAGNOSIS — R21 Rash and other nonspecific skin eruption: Secondary | ICD-10-CM | POA: Diagnosis not present

## 2023-08-24 DIAGNOSIS — T148XXA Other injury of unspecified body region, initial encounter: Secondary | ICD-10-CM

## 2023-08-24 DIAGNOSIS — L089 Local infection of the skin and subcutaneous tissue, unspecified: Secondary | ICD-10-CM | POA: Diagnosis not present

## 2023-08-24 DIAGNOSIS — N1831 Chronic kidney disease, stage 3a: Secondary | ICD-10-CM | POA: Diagnosis not present

## 2023-08-24 DIAGNOSIS — I5032 Chronic diastolic (congestive) heart failure: Secondary | ICD-10-CM | POA: Diagnosis not present

## 2023-08-24 DIAGNOSIS — Z09 Encounter for follow-up examination after completed treatment for conditions other than malignant neoplasm: Secondary | ICD-10-CM | POA: Diagnosis not present

## 2023-08-24 MED ORDER — TRIAMCINOLONE ACETONIDE 0.5 % EX OINT
1.0000 | TOPICAL_OINTMENT | Freq: Two times a day (BID) | CUTANEOUS | 0 refills | Status: AC
Start: 2023-08-24 — End: 2023-09-23

## 2023-08-24 NOTE — Progress Notes (Signed)
Established Patient Office Visit  Subjective   Patient ID: Mitchell Meath., Mitchell    DOB: 10-13-1951  Age: 72 y.o. MRN: [id number]  Chief Complaint  Patient presents with   Transitions Of Care    HPI  Today's visit was for Transitional Care Management.  The patient was discharged from [facility] on 08/14/23 with a primary diagnosis of wound infection.   Contact with the patient and/or caregiver, by a clinical staff member, was made on 08/15/23 and was documented as a telephone encounter within the EMR.  Through chart review and discussion with the patient I have determined that management of their condition is of moderate complexity.    Patient presented to ED secondary to the worsening site of insect bite. He reported worsening redness, pain, swelling, and started to have drainage of pus. He was evaluated by orthopedic surgery and underwent right forearm excisional I&D on 08/12/2023 with hand surgery. Cultures were noted with MRSA. He was discharged home with doxycycline for 3 weeks. He followed up with Dr. Kerry Fort with hand surgery 2 days ago. He Has been packing the wound daily and changing the dressing daily. Denies drainage, fever, chills. He is compliant with doxycyline. Reports improving erythema, tenderness.Has another follow up with ortho next week.   He has had a rash for the last couple weeks. Areas on both legs and his right arm. Scaly, circular areas. No itching, pain, or drainage. Some areas on his arm have cleared up. Has not tried any remedies.    Past Medical History:  Diagnosis Date   Chronic diarrhea    CKD (chronic kidney disease) stage 3, GFR 30-59 ml/min (HCC)    COPD (chronic obstructive pulmonary disease) (HCC)    Dysrhythmia 02/2022   paroxsymal SVT & aflutter 02/2022 XioXT   Essential hypertension    Gallstones    Gout    History of CHF (congestive heart failure)    History of colonic polyps    History of DVT (deep vein thrombosis) 04/19/2012   BLE  DVT 04/09/12   Iron deficiency 02/16/2023   Kidney stones    Lumbar disc disease    Neuropathy    Nonischemic cardiomyopathy (HCC) 2003   Minor coronary atherosclerosis at cardiac catheterization 2003, LVEF initially 20% with normalization   Obesity    Orthostatic hypotension    Presence of Watchman left atrial appendage closure device 02/02/2023   27mm Watchman placed by Dr. Excell Seltzer   RA (rheumatoid arthritis) [facility])    Sleep apnea    Subarachnoid hemorrhage (HCC) 02/03/2022   Traumatic   Subdural hematoma (HCC) 02/03/2022   Traumatic   Syncope, vasovagal    Type 2 diabetes mellitus (HCC)       ROS As per HPI.    Objective:     BP (!) 102/54   Pulse (!) 51   Temp 98 F (36.7 C) (Temporal)   Ht 6\' 1"  (1.854 m)   Wt (!) 312 lb 6 oz (141.7 kg)   SpO2 99%   BMI 41.21 kg/m    Physical Exam Vitals and nursing note reviewed.  Constitutional:      General: He is not in acute distress.    Appearance: He is obese. He is not ill-appearing, toxic-appearing or diaphoretic.  Cardiovascular:     Rate and Rhythm: Normal rate and regular rhythm.     Heart sounds: Normal heart sounds. No murmur heard. Pulmonary:     Effort: Pulmonary effort is normal. No respiratory distress.  Breath sounds: Normal breath sounds. No wheezing.  Abdominal:     General: There is no distension.     Tenderness: There is no abdominal tenderness.  Musculoskeletal:     Right lower leg: No edema.     Left lower leg: No edema.  Skin:    Findings: Rash (scaly, cicular. Pictured below) and wound (clean dressing intact) present.  Neurological:     General: No focal deficit present.     Mental Status: He is alert and oriented to person, place, and time.  Psychiatric:        Mood and Affect: Mood normal.        Behavior: Behavior normal.      No results found for any visits on 08/24/23.    The ASCVD Risk score (Arnett DK, et al., 2019) failed to calculate for the following reasons:   The  valid total cholesterol range is 130 to 320 mg/dL    Assessment & Plan:   Mitchell "Annette Stable" was seen today for transitions of care.  Diagnoses and all orders for this visit:  Cellulitis of right upper extremity Wound infection Improving symtpoms. Continue follow up with ortho, doxycyline, and daily dressing and packing changes. Strict return precautions given.   Chronic renal impairment, stage 3a (HCC) Will recheck labs as below.  -     CBC with Differential/Platelet -     BMP8+EGFR  Primary hypertension BP at goal.   Chronic diastolic congestive heart failure (HCC) Euvolemic on exam.   Rash Discussed likely granuloma annulare. Try kenalog as below. Referral to derm for further evaluation.  -     triamcinolone ointment (KENALOG) 0.5 %; Apply 1 Application topically 2 (two) times daily. -     Ambulatory referral to Dermatology  Hospital discharge Reviewed records, labs, reports.   Keep scheduled follow ups, sooner for new or worsening symptoms.    Gabriel Earing, FNP

## 2023-08-25 LAB — BMP8+EGFR
BUN/Creatinine Ratio: 17 (ref 10–24)
BUN: 26 mg/dL (ref 8–27)
CO2: 24 mmol/L (ref 20–29)
Calcium: 9.2 mg/dL (ref 8.6–10.2)
Chloride: 103 mmol/L (ref 96–106)
Creatinine, Ser: 1.51 mg/dL — ABNORMAL HIGH (ref 0.76–1.27)
Glucose: 134 mg/dL — ABNORMAL HIGH (ref 70–99)
Potassium: 5.1 mmol/L (ref 3.5–5.2)
Sodium: 140 mmol/L (ref 134–144)
eGFR: 49 mL/min/{1.73_m2} — ABNORMAL LOW (ref >59)

## 2023-08-25 LAB — CBC WITH DIFFERENTIAL/PLATELET
Basophils Absolute: 0.1 10*3/uL (ref 0.0–0.2)
Basos: 1 %
EOS (ABSOLUTE): 0.2 10*3/uL (ref 0.0–0.4)
Eos: 6 %
Hematocrit: 38.2 % (ref 37.5–51.0)
Hemoglobin: 13 g/dL (ref 13.0–17.7)
Immature Grans (Abs): 0 10*3/uL (ref 0.0–0.1)
Immature Granulocytes: 0 %
Lymphocytes Absolute: 0.9 10*3/uL (ref 0.7–3.1)
Lymphs: 21 %
MCH: 33.2 pg — ABNORMAL HIGH (ref 26.6–33.0)
MCHC: 34 g/dL (ref 31.5–35.7)
MCV: 98 fL — ABNORMAL HIGH (ref 79–97)
Monocytes Absolute: 0.5 10*3/uL (ref 0.1–0.9)
Monocytes: 11 %
Neutrophils Absolute: 2.7 10*3/uL (ref 1.4–7.0)
Neutrophils: 61 %
Platelets: 152 10*3/uL (ref 150–450)
RBC: 3.91 x10E6/uL — ABNORMAL LOW (ref 4.14–5.80)
RDW: 13.2 % (ref 11.6–15.4)
WBC: 4.4 10*3/uL (ref 3.4–10.8)

## 2023-08-27 ENCOUNTER — Other Ambulatory Visit: Payer: Self-pay | Admitting: Family Medicine

## 2023-08-31 ENCOUNTER — Other Ambulatory Visit: Payer: Self-pay | Admitting: Family Medicine

## 2023-08-31 DIAGNOSIS — N400 Enlarged prostate without lower urinary tract symptoms: Secondary | ICD-10-CM

## 2023-08-31 DIAGNOSIS — N1832 Chronic kidney disease, stage 3b: Secondary | ICD-10-CM

## 2023-08-31 DIAGNOSIS — I10 Essential (primary) hypertension: Secondary | ICD-10-CM

## 2023-08-31 DIAGNOSIS — I509 Heart failure, unspecified: Secondary | ICD-10-CM

## 2023-09-11 ENCOUNTER — Other Ambulatory Visit: Payer: Medicare Other

## 2023-09-11 DIAGNOSIS — N1831 Chronic kidney disease, stage 3a: Secondary | ICD-10-CM | POA: Diagnosis not present

## 2023-09-12 ENCOUNTER — Encounter: Payer: Self-pay | Admitting: Family Medicine

## 2023-09-12 ENCOUNTER — Ambulatory Visit (INDEPENDENT_AMBULATORY_CARE_PROVIDER_SITE_OTHER): Payer: Medicare Other | Admitting: Family Medicine

## 2023-09-12 VITALS — Temp 97.6°F | Ht 73.0 in | Wt 319.2 lb

## 2023-09-12 DIAGNOSIS — H8302 Labyrinthitis, left ear: Secondary | ICD-10-CM

## 2023-09-12 DIAGNOSIS — J0110 Acute frontal sinusitis, unspecified: Secondary | ICD-10-CM

## 2023-09-12 DIAGNOSIS — R42 Dizziness and giddiness: Secondary | ICD-10-CM

## 2023-09-12 DIAGNOSIS — Z23 Encounter for immunization: Secondary | ICD-10-CM

## 2023-09-12 LAB — BMP8+EGFR
BUN/Creatinine Ratio: 24 (ref 10–24)
BUN: 30 mg/dL — ABNORMAL HIGH (ref 8–27)
CO2: 23 mmol/L (ref 20–29)
Calcium: 8.8 mg/dL (ref 8.6–10.2)
Chloride: 105 mmol/L (ref 96–106)
Creatinine, Ser: 1.25 mg/dL (ref 0.76–1.27)
Glucose: 68 mg/dL — ABNORMAL LOW (ref 70–99)
Potassium: 4.6 mmol/L (ref 3.5–5.2)
Sodium: 141 mmol/L (ref 134–144)
eGFR: 62 mL/min/{1.73_m2} (ref >59)

## 2023-09-12 MED ORDER — AMOXICILLIN-POT CLAVULANATE 875-125 MG PO TABS: 1.0000 | ORAL_TABLET | Freq: Two times a day (BID) | ORAL | 0 refills | Status: DC

## 2023-09-12 MED ORDER — BETAMETHASONE SOD PHOS & ACET 6 (3-3) MG/ML IJ SUSP
6.0000 mg | Freq: Once | INTRAMUSCULAR | Status: AC
Start: 2023-09-12 — End: 2023-09-12
  Administered 2023-09-12: 6 mg via INTRAMUSCULAR

## 2023-09-12 MED ORDER — HYDROXYZINE PAMOATE 50 MG PO CAPS: 50.0000 mg | ORAL_CAPSULE | Freq: Three times a day (TID) | ORAL | 0 refills | Status: DC | PRN

## 2023-09-12 NOTE — Progress Notes (Signed)
Subjective:  Patient ID: Mitchell Jarred., male    DOB: [birth year]  Age: 72 y.o. MRN: [id number]  CC: Dizziness   HPI Mitchell SEVER Sr. presents for left frontal HA. Also describess room spinning when getting out of bed. Lasts 1.5-2 hours. Then back to normal for the rest of the day.  Feels like stumbling around. Can't walk straight. Accompanied by Left frontal HA and nausea. No vomiting. Things moving around. Onset 8 days ago.     09/12/2023    3:09 PM 08/24/2023    7:57 AM 07/05/2023    9:46 AM  Depression screen PHQ 2/9  Decreased Interest 0 0 0  Down, Depressed, Hopeless 1 0 0  PHQ - 2 Score 1 0 0  Altered sleeping 0 2   Tired, decreased energy 1 1   Change in appetite 0 0   Feeling bad or failure about yourself  0 0   Trouble concentrating 0 0   Moving slowly or fidgety/restless 0 0   Suicidal thoughts 0 0   PHQ-9 Score 2 3   Difficult doing work/chores Not difficult at all Somewhat difficult     History Mitchell Parker has a past medical history of Chronic diarrhea, CKD (chronic kidney disease) stage 3, GFR 30-59 ml/min (HCC), COPD (chronic obstructive pulmonary disease) (HCC), Dysrhythmia (02/2022), Essential hypertension, Gallstones, Gout, History of CHF (congestive heart failure), History of colonic polyps, History of DVT (deep vein thrombosis) (04/19/2012), Iron deficiency (02/16/2023), Kidney stones, Lumbar disc disease, Neuropathy, Nonischemic cardiomyopathy (HCC) (2003), Obesity, Orthostatic hypotension, Presence of Watchman left atrial appendage closure device (02/02/2023), RA (rheumatoid arthritis) (HCC), Sleep apnea, Subarachnoid hemorrhage (HCC) (02/03/2022), Subdural hematoma (HCC) (02/03/2022), Syncope, vasovagal, and Type 2 diabetes mellitus (HCC).   He has a past surgical history that includes Umbilical hernia repair; Pilonidal cyst excision (1997); Varicose vein surgery; Cholecystectomy (01/18/2012); Bariatric Surgery (01/11/2018); Eye surgery (Right); [facility] (Right,  05/04/2022); Toe amputation (Right); LEFT ATRIAL APPENDAGE OCCLUSION (N/A, 02/02/2023); TEE without cardioversion (N/A, 02/02/2023); Colonoscopy (05/2017); watchman implant; and I & D extremity (Right, 08/12/2023).   His family history includes Diabetes in his brother and father; Heart disease in his father; Hypertension in his father and mother; Stroke in his mother.He reports that he quit smoking about 28 years ago. His smoking use included cigarettes. He started smoking about 53 years ago. He has a 50 pack-year smoking history. He has never used smokeless tobacco. He reports that he does not drink alcohol and does not use drugs.    ROS Review of Systems  Constitutional: Negative.   HENT: Negative.    Eyes:  Negative for visual disturbance.  Respiratory:  Negative for cough and shortness of breath.   Cardiovascular:  Negative for chest pain and leg swelling.  Gastrointestinal:  Negative for abdominal pain, diarrhea, nausea and vomiting.  Genitourinary:  Negative for difficulty urinating.  Musculoskeletal:  Negative for arthralgias and myalgias.  Skin:  Negative for rash.  Neurological:  Negative for headaches.  Psychiatric/Behavioral:  Negative for sleep disturbance.     Objective:  BP 133/66 (BP Location: Right Arm, Patient Position: Standing, Cuff Size: Large)   Temp 97.6 F (36.4 C)   Ht 6\' 1"  (1.854 m)   Wt (!) 319 lb 3.2 oz (144.8 kg)   SpO2 97%   BMI 42.11 kg/m   BP Readings from Last 3 Encounters:  09/12/23 133/66  08/24/23 (!) 102/54  08/14/23 (!) 154/52    Wt Readings from Last 3 Encounters:  09/12/23 [name])  319 lb 3.2 oz (144.8 kg)  08/24/23 (!) 312 lb 6 oz (141.7 kg)  08/11/23 (!) 301 lb (136.5 kg)     Physical Exam Vitals reviewed.  Constitutional:      Appearance: He is well-developed.  HENT:     Head: Normocephalic and atraumatic.     Comments: Tender at left frontal sinus    Right Ear: Tympanic membrane and external ear normal.     Left Ear: Tympanic  membrane and external ear normal.     Nose: Congestion present.     Mouth/Throat:     Pharynx: No oropharyngeal exudate or posterior oropharyngeal erythema.  Eyes:     Pupils: Pupils are equal, round, and reactive to light.  Cardiovascular:     Rate and Rhythm: Normal rate and regular rhythm.     Heart sounds: No murmur heard. Pulmonary:     Effort: No respiratory distress.     Breath sounds: Normal breath sounds.  Musculoskeletal:     Cervical back: Normal range of motion and neck supple.  Neurological:     Mental Status: He is alert and oriented to person, place, and time.       Assessment & Plan:   Mitchell "Annette Stable" was seen today for dizziness.  Diagnoses and all orders for this visit:  Labyrinthitis of left ear  Acute non-recurrent frontal sinusitis -     betamethasone acetate-betamethasone sodium phosphate (CELESTONE) injection 6 mg  Vertigo  Other orders -     hydrOXYzine (VISTARIL) 50 MG capsule; Take 1 capsule (50 mg total) by mouth 3 (three) times daily as needed for nausea or vomiting (and vertigo). -     amoxicillin-clavulanate (AUGMENTIN) 875-125 MG tablet; Take 1 tablet by mouth 2 (two) times daily. Take all of this medication       [name] Rod Sr. "Mitchell Parker" start on hydrOXYzine and amoxicillin-clavulanate. I am also having him maintain his calcium carbonate, vitamin B-12, acetaminophen, ferrous sulfate, Colchicine, fluocinolone, DULoxetine, multivitamin with minerals, silver sulfADIAZINE, allopurinol, amLODipine-olmesartan, atorvastatin, calcitRIOL, potassium chloride SA, celecoxib, hydroxychloroquine, glimepiride, oxyCODONE, triamcinolone ointment, pioglitazone, tamsulosin, cloNIDine, carvedilol, and furosemide. We will continue to administer betamethasone acetate-betamethasone sodium phosphate.  Allergies as of 09/12/2023       Reactions   Ciprofloxacin Hives   Definity [perflutren Lipid Microsphere] Nausea And Vomiting   During ECHO with  Definity injection pt became nauseated and vomited    Levofloxacin [levofloxacin] Hives        Medication List        Accurate as of September 12, 2023  3:41 PM. If you have any questions, ask your nurse or doctor.          acetaminophen 500 MG tablet Commonly known as: TYLENOL Take 500 mg by mouth every 6 (six) hours as needed for mild pain.   allopurinol 300 MG tablet Commonly known as: ZYLOPRIM Take 1 tablet (300 mg total) by mouth every morning.   amLODipine-olmesartan 10-40 MG tablet Commonly known as: AZOR Take 1 tablet by mouth daily.   amoxicillin-clavulanate 875-125 MG tablet Commonly known as: AUGMENTIN Take 1 tablet by mouth 2 (two) times daily. Take all of this medication Started by: Travante Knee   atorvastatin 80 MG tablet Commonly known as: LIPITOR TAKE 1 TABLET BY MOUTH DAILY AT  6 PM   calcitRIOL 0.25 MCG capsule Commonly known as: ROCALTROL Take 1 capsule (0.25 mcg total) by mouth daily with breakfast.   calcium carbonate 1500 (600 Ca) MG Tabs tablet  Commonly known as: OSCAL Take 600 mg of elemental calcium by mouth 2 (two) times daily with a meal.   carvedilol 6.25 MG tablet Commonly known as: COREG TAKE ONE-HALF TABLET BY MOUTH  EVERY MORNING AND 1 TABLET BY  MOUTH EVERY EVENING   celecoxib 200 MG capsule Commonly known as: CELEBREX TAKE 1 CAPSULE BY MOUTH DAILY  WITH FOOD   cloNIDine 0.2 MG tablet Commonly known as: CATAPRES TAKE 1 TABLET BY MOUTH TWICE  DAILY FOR BLOOD PRESSURE   Colchicine 0.6 MG Caps Take 1 capsule by mouth daily. What changed:  when to take this reasons to take this   DULoxetine 30 MG capsule Commonly known as: CYMBALTA TAKE 1 CAPSULE BY MOUTH TWICE  DAILY   ferrous sulfate 325 (65 FE) MG tablet Take 325 mg by mouth in the morning.   fluocinolone 0.01 % external solution Commonly known as: Synalar Place 3-5 drops in each ear canal twice daily What changed:  how much to take when to take this reasons  to take this   furosemide 40 MG tablet Commonly known as: LASIX TAKE 1 TABLET BY MOUTH IN THE  MORNING   glimepiride 2 MG tablet Commonly known as: AMARYL Take 1 tablet (2 mg total) by mouth daily before breakfast.   hydroxychloroquine 200 MG tablet Commonly known as: PLAQUENIL TAKE 1 TABLET BY MOUTH IN THE  MORNING AND AT BEDTIME   hydrOXYzine 50 MG capsule Commonly known as: VISTARIL Take 1 capsule (50 mg total) by mouth 3 (three) times daily as needed for nausea or vomiting (and vertigo). Started by: Broadus John Brittlyn Cloe   multivitamin with minerals Tabs tablet Take 1 tablet by mouth in the morning.   oxyCODONE 5 MG immediate release tablet Commonly known as: Oxy IR/ROXICODONE Take 1 tablet (5 mg total) by mouth every 4 (four) hours as needed for moderate pain, severe pain or breakthrough pain.   pioglitazone 30 MG tablet Commonly known as: ACTOS TAKE 1 TABLET BY MOUTH DAILY   potassium chloride SA 20 MEQ tablet Commonly known as: KLOR-CON M TAKE 1 TABLET BY MOUTH DAILY FOR POTASSIUM REPLACEMENT/  SUPPLEMENT   silver sulfADIAZINE 1 % cream Commonly known as: SILVADENE Apply topically as needed.   tamsulosin 0.4 MG Caps capsule Commonly known as: FLOMAX TAKE 1 CAPSULE BY MOUTH AT  BEDTIME   triamcinolone ointment 0.5 % Commonly known as: KENALOG Apply 1 Application topically 2 (two) times daily.   vitamin B-12 500 MCG tablet Commonly known as: CYANOCOBALAMIN Take 500 mcg by mouth in the morning.         Follow-up: Return if symptoms worsen or fail to improve.  [name], M.D.

## 2023-09-12 NOTE — Addendum Note (Signed)
Addended by: Adella Hare B on: 09/12/2023 03:53 PM   Modules accepted: Orders

## 2023-09-13 LAB — FUNGUS CULTURE WITH STAIN

## 2023-09-13 LAB — FUNGUS CULTURE RESULT

## 2023-09-25 LAB — ACID FAST CULTURE WITH REFLEXED SENSITIVITIES (MYCOBACTERIA): Acid Fast Culture: NEGATIVE

## 2023-10-02 ENCOUNTER — Ambulatory Visit: Payer: Medicare Other | Admitting: Cardiology

## 2023-10-03 ENCOUNTER — Other Ambulatory Visit: Payer: Self-pay | Admitting: Family Medicine

## 2023-10-05 ENCOUNTER — Ambulatory Visit: Payer: Medicare Other | Admitting: Family Medicine

## 2023-10-05 ENCOUNTER — Encounter: Payer: Self-pay | Admitting: Family Medicine

## 2023-10-05 VITALS — BP 133/61 | HR 53 | Temp 96.8°F | Ht 73.0 in | Wt 321.0 lb

## 2023-10-05 DIAGNOSIS — I10 Essential (primary) hypertension: Secondary | ICD-10-CM | POA: Diagnosis not present

## 2023-10-05 DIAGNOSIS — I480 Paroxysmal atrial fibrillation: Secondary | ICD-10-CM | POA: Diagnosis not present

## 2023-10-05 DIAGNOSIS — E1122 Type 2 diabetes mellitus with diabetic chronic kidney disease: Secondary | ICD-10-CM

## 2023-10-05 DIAGNOSIS — J449 Chronic obstructive pulmonary disease, unspecified: Secondary | ICD-10-CM | POA: Diagnosis not present

## 2023-10-05 DIAGNOSIS — N1832 Chronic kidney disease, stage 3b: Secondary | ICD-10-CM

## 2023-10-05 NOTE — Progress Notes (Signed)
Subjective:  Patient ID: Mitchell Jarred.,  male    DOB: 11/19/1951  Age: 72 y.o.    CC: Medical Management of Chronic Issues   HPI Mitchell Parker. presents for  follow-up of hypertension. Patient has no history of headache chest pain or shortness of breath or recent cough. Patient also denies symptoms of TIA such as numbness weakness lateralizing. Patient denies side effects from medication. States taking it regularly.  Patient also  in for follow-up of elevated cholesterol. Doing well without complaints on current medication. Denies side effects  including myalgia and arthralgia and nausea. Also in today for liver function testing. Currently no chest pain, shortness of breath or other cardiovascular related symptoms noted.  Follow-up of diabetes. Patient does check blood sugar at home. Readings run between 70 and 100 fasting.  Patient denies symptoms such as excessive hunger or urinary frequency, excessive hunger, nausea No significant hypoglycemic spells noted. Medications reviewed. Pt reports taking them regularly. Pt. denies complication/adverse reaction today.  Some stinging in th eright foot. Not frequent. Lasts about 12 hours a couple of time a month.    Colonoscopy scheduled for 11/27/23 MRI of lower back coming up soon. Can't lift over 10 lb. [facility] found disc and arthritis problems. Having more pain.    History Mitchell Parker has a past medical history of Chronic diarrhea, CKD (chronic kidney disease) stage 3, GFR 30-59 ml/min (HCC), COPD (chronic obstructive pulmonary disease) (HCC), Dysrhythmia (02/2022), Essential hypertension, Gallstones, Gout, History of CHF (congestive heart failure), History of colonic polyps, History of DVT (deep vein thrombosis) (04/19/2012), Iron deficiency (02/16/2023), Kidney stones, Lumbar disc disease, Neuropathy, Nonischemic cardiomyopathy (HCC) (2003), Obesity, Orthostatic hypotension, Presence of Watchman left atrial appendage closure device (02/02/2023),  RA (rheumatoid arthritis) (HCC), Sleep apnea, Subarachnoid hemorrhage (HCC) (02/03/2022), Subdural hematoma (HCC) (02/03/2022), Syncope, vasovagal, and Type 2 diabetes mellitus (HCC).   He has a past surgical history that includes Umbilical hernia repair; Pilonidal cyst excision (1997); Varicose vein surgery; Cholecystectomy (01/18/2012); Bariatric Surgery (01/11/2018); Eye surgery (Right); [facility] (Right, 05/04/2022); Toe amputation (Right); LEFT ATRIAL APPENDAGE OCCLUSION (N/A, 02/02/2023); TEE without cardioversion (N/A, 02/02/2023); Colonoscopy (05/2017); watchman implant; and I & D extremity (Right, 08/12/2023).   His family history includes Diabetes in his brother and father; Heart disease in his father; Hypertension in his father and mother; Stroke in his mother.He reports that he quit smoking about 28 years ago. His smoking use included cigarettes. He started smoking about 53 years ago. He has a 50 pack-year smoking history. He has never used smokeless tobacco. He reports that he does not drink alcohol and does not use drugs.  Current Outpatient Medications on File Prior to Visit  Medication Sig Dispense Refill   acetaminophen (TYLENOL) 500 MG tablet Take 500 mg by mouth every 6 (six) hours as needed for mild pain.     allopurinol (ZYLOPRIM) 300 MG tablet Take 1 tablet (300 mg total) by mouth every morning. 100 tablet 2   amLODipine-olmesartan (AZOR) 10-40 MG tablet Take 1 tablet by mouth daily. 100 tablet 2   atorvastatin (LIPITOR) 80 MG tablet TAKE 1 TABLET BY MOUTH DAILY AT  6 PM 100 tablet 2   calcitRIOL (ROCALTROL) 0.25 MCG capsule Take 1 capsule (0.25 mcg total) by mouth daily with breakfast. 100 capsule 2   calcium carbonate (OSCAL) 1500 (600 Ca) MG TABS tablet Take 600 mg of elemental calcium by mouth 2 (two) times daily with a meal.     carvedilol (COREG) 6.25 MG  tablet TAKE ONE-HALF TABLET BY MOUTH  EVERY MORNING AND 1 TABLET BY  MOUTH EVERY EVENING 150 tablet 0   celecoxib  (CELEBREX) 200 MG capsule TAKE 1 CAPSULE BY MOUTH DAILY  WITH FOOD 100 capsule 0   cloNIDine (CATAPRES) 0.2 MG tablet TAKE 1 TABLET BY MOUTH TWICE  DAILY FOR BLOOD PRESSURE 200 tablet 0   Colchicine 0.6 MG CAPS Take 1 capsule by mouth daily. (Patient taking differently: Take 1 capsule by mouth as needed (gout flareups).) 90 capsule 2   DULoxetine (CYMBALTA) 30 MG capsule TAKE 1 CAPSULE BY MOUTH TWICE  DAILY 200 capsule 0   ferrous sulfate 325 (65 FE) MG tablet Take 325 mg by mouth in the morning.     fluocinolone (SYNALAR) 0.01 % external solution Place 3-5 drops in each ear canal twice daily (Patient taking differently: 3-5 drops 2 (two) times daily as needed (irritation/infection.). Place 3-5 drops in each ear canal twice daily) 60 mL 5   furosemide (LASIX) 40 MG tablet TAKE 1 TABLET BY MOUTH IN THE  MORNING 100 tablet 0   glimepiride (AMARYL) 2 MG tablet Take 1 tablet (2 mg total) by mouth daily before breakfast. 90 tablet 1   hydroxychloroquine (PLAQUENIL) 200 MG tablet TAKE 1 TABLET BY MOUTH IN THE  MORNING AND AT BEDTIME 200 tablet 2   hydrOXYzine (VISTARIL) 50 MG capsule Take 1 capsule (50 mg total) by mouth 3 (three) times daily as needed for nausea or vomiting (and vertigo). 30 capsule 0   Multiple Vitamin (MULTIVITAMIN WITH MINERALS) TABS tablet Take 1 tablet by mouth in the morning.     oxyCODONE (OXY IR/ROXICODONE) 5 MG immediate release tablet Take 1 tablet (5 mg total) by mouth every 4 (four) hours as needed for moderate pain, severe pain or breakthrough pain. 30 tablet 0   pioglitazone (ACTOS) 30 MG tablet TAKE 1 TABLET BY MOUTH DAILY 100 tablet 0   potassium chloride SA (KLOR-CON M) 20 MEQ tablet TAKE 1 TABLET BY MOUTH DAILY FOR POTASSIUM REPLACEMENT/  SUPPLEMENT 100 tablet 2   silver sulfADIAZINE (SILVADENE) 1 % cream Apply topically as needed.     tamsulosin (FLOMAX) 0.4 MG CAPS capsule TAKE 1 CAPSULE BY MOUTH AT  BEDTIME 100 capsule 0   vitamin B-12 (CYANOCOBALAMIN) 500 MCG tablet  Take 500 mcg by mouth in the morning.     No current facility-administered medications on file prior to visit.    ROS Review of Systems  Constitutional:  Negative for fever.  Respiratory:  Negative for shortness of breath.   Cardiovascular:  Negative for chest pain.  Musculoskeletal:  Negative for arthralgias.  Skin:  Negative for rash.    Objective:  BP 133/61   Pulse (!) 53   Temp (!) 96.8 F (36 C)   Ht 6\' 1"  (1.854 m)   Wt (!) 321 lb (145.6 kg)   SpO2 96%   BMI 42.35 kg/m   BP Readings from Last 3 Encounters:  10/05/23 133/61  08/24/23 (!) 102/54  08/14/23 (!) 154/52    Wt Readings from Last 3 Encounters:  10/05/23 (!) 321 lb (145.6 kg)  09/12/23 (!) 319 lb 3.2 oz (144.8 kg)  08/24/23 (!) 312 lb 6 oz (141.7 kg)     Physical Exam Vitals reviewed.  Constitutional:      Appearance: He is well-developed. He is obese.  HENT:     Head: Normocephalic and atraumatic.     Right Ear: External ear normal.     Left Ear:  External ear normal.     Mouth/Throat:     Pharynx: No oropharyngeal exudate or posterior oropharyngeal erythema.  Eyes:     Pupils: Pupils are equal, round, and reactive to light.  Cardiovascular:     Rate and Rhythm: Normal rate and regular rhythm.     Heart sounds: No murmur heard. Pulmonary:     Effort: No respiratory distress.     Breath sounds: Normal breath sounds.  Musculoskeletal:     Cervical back: Normal range of motion and neck supple.  Neurological:     Mental Status: He is alert and oriented to person, place, and time.     Diabetic Foot Exam - Simple   No data filed     Lab Results  Component Value Date   HGBA1C 6.1 (H) 08/11/2023   HGBA1C 5.8 (H) 07/05/2023   HGBA1C 5.9 (H) 04/05/2023    Assessment & Plan:   Avary "Annette Stable" was seen today for medical management of chronic issues.  Diagnoses and all orders for this visit:  Paroxysmal atrial fibrillation (HCC)  Chronic obstructive pulmonary disease, unspecified COPD  type (HCC)  Type 2 diabetes mellitus with stage 3b chronic kidney disease, without long-term current use of insulin (HCC)  Primary hypertension   I have discontinued [name] "Bill"'s amoxicillin-clavulanate. I am also having him maintain his calcium carbonate, vitamin B-12, acetaminophen, ferrous sulfate, Colchicine, fluocinolone, DULoxetine, multivitamin with minerals, silver sulfADIAZINE, allopurinol, amLODipine-olmesartan, atorvastatin, calcitRIOL, potassium chloride SA, celecoxib, glimepiride, oxyCODONE, pioglitazone, tamsulosin, cloNIDine, carvedilol, furosemide, hydrOXYzine, and hydroxychloroquine.  No orders of the defined types were placed in this encounter.    Follow-up: Return in about 3 months (around 01/05/2024).  [name], M.D.

## 2023-10-19 ENCOUNTER — Encounter: Payer: Medicare Other | Admitting: Family Medicine

## 2023-10-19 ENCOUNTER — Ambulatory Visit: Payer: Medicare Other

## 2023-10-19 VITALS — Ht 73.0 in | Wt 308.0 lb

## 2023-10-19 DIAGNOSIS — Z0000 Encounter for general adult medical examination without abnormal findings: Secondary | ICD-10-CM

## 2023-10-19 NOTE — Patient Instructions (Addendum)
Mr. Maner , Thank you for taking time to come for your Medicare Wellness Visit. I appreciate your ongoing commitment to your health goals. Please review the following plan we discussed and let me know if I can assist you in the future.   Referrals/Orders/Follow-Ups/Clinician Recommendations:   This is a list of the screening recommended for you and due dates:  Health Maintenance  Topic Date Due   Complete foot exam   Never done   Eye exam for diabetics  Never done   Colon Cancer Screening  12/12/2023*   COVID-19 Vaccine (7 - 2023-24 season) 11/08/2023   Yearly kidney health urinalysis for diabetes  01/05/2024   Hemoglobin A1C  02/11/2024   Yearly kidney function blood test for diabetes  09/10/2024   Medicare Annual Wellness Visit  10/18/2024   DTaP/Tdap/Td vaccine (4 - Td or Tdap) 02/04/2032   Pneumonia Vaccine  Completed   Flu Shot  Completed   Hepatitis C Screening  Completed   Zoster (Shingles) Vaccine  Completed   HPV Vaccine  Aged Out  *Topic was postponed. The date shown is not the original due date.   Opioid Pain Medicine Management Opioids are powerful medicines that are used to treat moderate to severe pain. When used for short periods of time, they can help you to: Sleep better. Do better in physical or occupational therapy. Feel better in the first few days after an injury. Recover from surgery. Opioids should be taken with the supervision of a trained health care provider. They should be taken for the shortest period of time possible. This is because opioids can be addictive, and the longer you take opioids, the greater your risk of addiction. This addiction can also be called opioid use disorder. What are the risks? Using opioid pain medicines for longer than 3 days increases your risk of side effects. Side effects include: Constipation. Nausea and vomiting. Breathing difficulties (respiratory depression). Drowsiness. Confusion. Opioid use  disorder. Itching. Taking opioid pain medicine for a long period of time can affect your ability to do daily tasks. It also puts you at risk for: Motor vehicle crashes. Depression. Suicide. Heart attack. Overdose, which can be life-threatening. What is a pain treatment plan? A pain treatment plan is an agreement between you and your health care provider. Pain is unique to each person, and treatments vary depending on your condition. To manage your pain, you and your health care provider need to work together. To help you do this: Discuss the goals of your treatment, including how much pain you might expect to have and how you will manage the pain. Review the risks and benefits of taking opioid medicines. Remember that a good treatment plan uses more than one approach and minimizes the chance of side effects. Be honest about the amount of medicines you take and about any drug or alcohol use. Get pain medicine prescriptions from only one health care provider. Pain can be managed with many types of alternative treatments. Ask your health care provider to refer you to one or more specialists who can help you manage pain through: Physical or occupational therapy. Counseling (cognitive behavioral therapy). Good nutrition. Biofeedback. Massage. Meditation. Non-opioid medicine. Following a gentle exercise program. How to use opioid pain medicine Taking medicine Take your pain medicine exactly as told by your health care provider. Take it only when you need it. If your pain gets less severe, you may take less than your prescribed dose if your health care provider approves. If you are  not having pain, do nottake pain medicine unless your health care provider tells you to take it. If your pain is severe, do nottry to treat it yourself by taking more pills than instructed on your prescription. Contact your health care provider for help. Write down the times when you take your pain medicine. It is  easy to become confused while on pain medicine. Writing the time can help you avoid overdose. Take other over-the-counter or prescription medicines only as told by your health care provider. Keeping yourself and others safe  While you are taking opioid pain medicine: Do not drive, use machinery, or power tools. Do not sign legal documents. Do not drink alcohol. Do not take sleeping pills. Do not supervise children by yourself. Do not do activities that require climbing or being in high places. Do not go to a lake, river, ocean, spa, or swimming pool. Do not share your pain medicine with anyone. Keep pain medicine in a locked cabinet or in a secure area where pets and children cannot reach it. Stopping your use of opioids If you have been taking opioid medicine for more than a few weeks, you may need to slowly decrease (taper) how much you take until you stop completely. Tapering your use of opioids can decrease your risk of symptoms of withdrawal, such as: Pain and cramping in the abdomen. Nausea. Sweating. Sleepiness. Restlessness. Uncontrollable shaking (tremors). Cravings for the medicine. Do not attempt to taper your use of opioids on your own. Talk with your health care provider about how to do this. Your health care provider may prescribe a step-down schedule based on how much medicine you are taking and how long you have been taking it. Getting rid of leftover pills Do not save any leftover pills. Get rid of leftover pills safely by: Taking the medicine to a prescription take-back program. This is usually offered by the county or law enforcement. Bringing them to a pharmacy that has a drug disposal container. Flushing them down the toilet. Check the label or package insert of your medicine to see whether this is safe to do. Throwing them out in the trash. Check the label or package insert of your medicine to see whether this is safe to do. If it is safe to throw it out, remove  the medicine from the original container, put it into a sealable bag or container, and mix it with used coffee grounds, food scraps, dirt, or cat litter before putting it in the trash. Follow these instructions at home: Activity Do exercises as told by your health care provider. Avoid activities that make your pain worse. Return to your normal activities as told by your health care provider. Ask your health care provider what activities are safe for you. General instructions You may need to take these actions to prevent or treat constipation: Drink enough fluid to keep your urine pale yellow. Take over-the-counter or prescription medicines. Eat foods that are high in fiber, such as beans, whole grains, and fresh fruits and vegetables. Limit foods that are high in fat and processed sugars, such as fried or sweet foods. Keep all follow-up visits. This is important. Where to find support If you have been taking opioids for a long time, you may benefit from receiving support for quitting from a local support group or counselor. Ask your health care provider for a referral to these resources in your area. Where to find more information Centers for Disease Control and Prevention (CDC): FootballExhibition.com.br [place] Food and Drug  Administration (FDA): PumpkinSearch.com.ee Get help right away if: You may have taken too much of an opioid (overdosed). Common symptoms of an overdose: Your breathing is slower or more shallow than normal. You have a very slow heartbeat (pulse). You have slurred speech. You have nausea and vomiting. Your pupils become very small. You have other potential symptoms: You are very confused. You faint or feel like you will faint. You have cold, clammy skin. You have blue lips or fingernails. You have thoughts of harming yourself or harming others. These symptoms may represent a serious problem that is an emergency. Do not wait to see if the symptoms will go away. Get medical help right away.  Call your local emergency services (911 in the [place] Do not drive yourself to the hospital.  If you ever feel like you may hurt yourself or others, or have thoughts about taking your own life, get help right away. Go to your nearest emergency department or: Call your local emergency services (911 in the [place] Call the [facility] (9412894170 in the [place] Call a suicide crisis helpline, such as the National Suicide Prevention Lifeline at (807)599-5177 or 988 in the [place] This is open 24 hours a day in the [place] Text the Crisis Text Line at 479-442-2490 (in the [place] Summary Opioid medicines can help you manage moderate to severe pain for a short period of time. A pain treatment plan is an agreement between you and your health care provider. Discuss the goals of your treatment, including how much pain you might expect to have and how you will manage the pain. If you think that you or someone else may have taken too much of an opioid, get medical help right away. This information is not intended to replace advice given to you by your health care provider. Make sure you discuss any questions you have with your health care provider. Document Revised: 07/07/2021 Document Reviewed: 03/24/2021 [employer] Patient Education  2024 [employer]  Advanced directives: (Copy Requested) Please bring a copy of your health care power of attorney and living will to the office to be added to your chart at your convenience.  Next Medicare Annual Wellness Visit scheduled for next year: Yes

## 2023-10-19 NOTE — Progress Notes (Signed)
Subjective:   Mitchell Parker. is a 72 y.o. male who presents for Medicare Annual/Subsequent preventive examination.  Visit Complete: Virtual I connected with  Mitchell PAWELSKI Sr. on 10/19/23 by a audio enabled telemedicine application and verified that I am speaking with the correct person using two identifiers.  Patient Location: Home  Provider Location: Home Office  I discussed the limitations of evaluation and management by telemedicine. The patient expressed understanding and agreed to proceed.  Vital Signs: Because this visit was a virtual/telehealth visit, some criteria may be missing or patient reported. Any vitals not documented were not able to be obtained and vitals that have been documented are patient reported.  Patient Medicare AWV questionnaire was completed by the patient on 10/16/23; I have confirmed that all information answered by patient is correct and no changes since this date.  Cardiac Risk Factors include: advanced age (>56men, >69 women);diabetes mellitus;male gender;hypertension     Objective:    Today's Vitals   10/19/23 1542  Weight: (!) 308 lb (139.7 kg)  Height: 6\' 1"  (1.854 m)   Body mass index is 40.64 kg/m.     10/19/2023    3:53 PM 08/18/2023    8:13 AM 08/11/2023   10:28 PM 08/11/2023    4:16 PM 08/09/2023   10:10 AM 02/16/2023    8:09 AM 02/02/2023    4:50 PM  Advanced Directives  Does Patient Have a Medical Advance Directive? Yes No No No No Yes No  Type of Estate agent of [place];Living will     Healthcare Power of [place];Living will   Does patient want to make changes to medical advance directive?      No - Patient declined   Copy of Healthcare Power of Attorney in Chart? No - copy requested     No - copy requested   Would patient like information on creating a medical advance directive?  No - Patient declined No - Patient declined No - Patient declined No - Patient declined No - Patient declined No - Patient  declined    Current Medications (verified) Outpatient Encounter Medications as of 10/19/2023  Medication Sig   acetaminophen (TYLENOL) 500 MG tablet Take 500 mg by mouth every 6 (six) hours as needed for mild pain.   allopurinol (ZYLOPRIM) 300 MG tablet Take 1 tablet (300 mg total) by mouth every morning.   amLODipine-olmesartan (AZOR) 10-40 MG tablet Take 1 tablet by mouth daily.   atorvastatin (LIPITOR) 80 MG tablet TAKE 1 TABLET BY MOUTH DAILY AT  6 PM   calcitRIOL (ROCALTROL) 0.25 MCG capsule Take 1 capsule (0.25 mcg total) by mouth daily with breakfast.   calcium carbonate (OSCAL) 1500 (600 Ca) MG TABS tablet Take 600 mg of elemental calcium by mouth 2 (two) times daily with a meal.   carvedilol (COREG) 6.25 MG tablet TAKE ONE-HALF TABLET BY MOUTH  EVERY MORNING AND 1 TABLET BY  MOUTH EVERY EVENING   celecoxib (CELEBREX) 200 MG capsule TAKE 1 CAPSULE BY MOUTH DAILY  WITH FOOD   cloNIDine (CATAPRES) 0.2 MG tablet TAKE 1 TABLET BY MOUTH TWICE  DAILY FOR BLOOD PRESSURE   Colchicine 0.6 MG CAPS Take 1 capsule by mouth daily. (Patient taking differently: Take 1 capsule by mouth as needed (gout flareups).)   DULoxetine (CYMBALTA) 30 MG capsule TAKE 1 CAPSULE BY MOUTH TWICE  DAILY   ferrous sulfate 325 (65 FE) MG tablet Take 325 mg by mouth in the morning.   fluocinolone (SYNALAR)  0.01 % external solution Place 3-5 drops in each ear canal twice daily (Patient taking differently: 3-5 drops 2 (two) times daily as needed (irritation/infection.). Place 3-5 drops in each ear canal twice daily)   furosemide (LASIX) 40 MG tablet TAKE 1 TABLET BY MOUTH IN THE  MORNING   glimepiride (AMARYL) 2 MG tablet Take 1 tablet (2 mg total) by mouth daily before breakfast.   hydroxychloroquine (PLAQUENIL) 200 MG tablet TAKE 1 TABLET BY MOUTH IN THE  MORNING AND AT BEDTIME   hydrOXYzine (VISTARIL) 50 MG capsule Take 1 capsule (50 mg total) by mouth 3 (three) times daily as needed for nausea or vomiting (and vertigo).    Multiple Vitamin (MULTIVITAMIN WITH MINERALS) TABS tablet Take 1 tablet by mouth in the morning.   oxyCODONE (OXY IR/ROXICODONE) 5 MG immediate release tablet Take 1 tablet (5 mg total) by mouth every 4 (four) hours as needed for moderate pain, severe pain or breakthrough pain.   pioglitazone (ACTOS) 30 MG tablet TAKE 1 TABLET BY MOUTH DAILY   potassium chloride SA (KLOR-CON M) 20 MEQ tablet TAKE 1 TABLET BY MOUTH DAILY FOR POTASSIUM REPLACEMENT/  SUPPLEMENT   silver sulfADIAZINE (SILVADENE) 1 % cream Apply topically as needed.   tamsulosin (FLOMAX) 0.4 MG CAPS capsule TAKE 1 CAPSULE BY MOUTH AT  BEDTIME   vitamin B-12 (CYANOCOBALAMIN) 500 MCG tablet Take 500 mcg by mouth in the morning.   No facility-administered encounter medications on file as of 10/19/2023.    Allergies (verified) Ciprofloxacin, Definity [perflutren lipid microsphere], and Levofloxacin [levofloxacin]   History: Past Medical History:  Diagnosis Date   Chronic diarrhea    CKD (chronic kidney disease) stage 3, GFR 30-59 ml/min (HCC)    COPD (chronic obstructive pulmonary disease) (HCC)    Dysrhythmia 02/2022   paroxsymal SVT & aflutter 02/2022 XioXT   Essential hypertension    Gallstones    Gout    History of CHF (congestive heart failure)    History of colonic polyps    History of DVT (deep vein thrombosis) 04/19/2012   BLE DVT 04/09/12   Iron deficiency 02/16/2023   Kidney stones    Lumbar disc disease    Neuropathy    Nonischemic cardiomyopathy (HCC) 2003   Minor coronary atherosclerosis at cardiac catheterization 2003, LVEF initially 20% with normalization   Obesity    Orthostatic hypotension    Presence of Watchman left atrial appendage closure device 02/02/2023   27mm Watchman placed by Dr. Excell Seltzer   RA (rheumatoid arthritis) [facility])    Sleep apnea    Subarachnoid hemorrhage (HCC) 02/03/2022   Traumatic   Subdural hematoma (HCC) 02/03/2022   Traumatic   Syncope, vasovagal    Type 2 diabetes mellitus  (HCC)    Past Surgical History:  Procedure Laterality Date   BARIATRIC SURGERY  01/11/2018   BURR HOLE Right 05/04/2022   Procedure: BURR HOLES FOR Subdural Hematoma;  Surgeon: Bedelia Person, MD;  Location: [facility] OR;  Service: Neurosurgery;  Laterality: Right;   CHOLECYSTECTOMY  01/18/2012   Procedure: LAPAROSCOPIC CHOLECYSTECTOMY WITH INTRAOPERATIVE CHOLANGIOGRAM;  Surgeon: Clovis Pu. Cornett, MD;  Location: [facility] ORS;  Service: General;  Laterality: N/A;   COLONOSCOPY  05/2017   [facility]: five polyps removed but only 3 retrieved due to bowel prep, pathology with tubular adenomas   EYE SURGERY Right    Film over retina was removed.   I & D EXTREMITY Right 08/12/2023   Procedure: IRRIGATION AND DEBRIDEMENT RIGHT FOREARM;  Surgeon: Shaune Pollack  M, MD;  Location: [facility] OR;  Service: Orthopedics;  Laterality: Right;   LEFT ATRIAL APPENDAGE OCCLUSION N/A 02/02/2023   Procedure: LEFT ATRIAL APPENDAGE OCCLUSION;  Surgeon: Tonny Bollman, MD;  Location: [facility] INVASIVE CV LAB;  Service: Cardiovascular;  Laterality: N/A;   PILONIDAL CYST EXCISION  1997   TEE WITHOUT CARDIOVERSION N/A 02/02/2023   Procedure: TRANSESOPHAGEAL ECHOCARDIOGRAM;  Surgeon: Tonny Bollman, MD;  Location: [facility] INVASIVE CV LAB;  Service: Cardiovascular;  Laterality: N/A;   TOE AMPUTATION Right    3 toes on right foot- big toe and next 2   UMBILICAL HERNIA REPAIR     VARICOSE VEIN SURGERY     left leg   watchman implant     Family History  Problem Relation Age of Onset   Hypertension Mother    Stroke Mother    Diabetes Father    Hypertension Father    Heart disease Father    Diabetes Brother    Colon cancer Neg Hx    Social History   Socioeconomic History   Marital status: Married    Spouse name: Claris Che   Number of children: 2   Years of education: Not on file   Highest education level: Some college, no degree  Occupational History   Occupation: retired  Tobacco Use   Smoking status:  Former    Current packs/day: 0.00    Average packs/day: 2.0 packs/day for 25.0 years (50.0 ttl pk-yrs)    Types: Cigarettes    Start date: 12/26/1969    Quit date: 12/26/1994    Years since quitting: 28.8   Smokeless tobacco: Never  Vaping Use   Vaping status: Never Used  Substance and Sexual Activity   Alcohol use: No   Drug use: No   Sexual activity: Not Currently  Other Topics Concern   Not on file  Social History Narrative   Lives at home with wife, and has two adult children. Still ambulatory, no cane or walker.    Works 30 hours per week at [employer]   Has VA benefits - goes to [place] once or twice a year for vision, hearing, screenings, vaccines, etc.   Social Determinants of Health   Financial Resource Strain: Low Risk  (10/16/2023)   Overall Financial Resource Strain (CARDIA)    Difficulty of Paying Living Expenses: Not hard at all  Food Insecurity: No Food Insecurity (10/16/2023)   Hunger Vital Sign    Worried About Running Out of Food in the Last Year: Never true    Ran Out of Food in the Last Year: Never true  Transportation Needs: No Transportation Needs (10/16/2023)   PRAPARE - Administrator, Civil Service (Medical): No    Lack of Transportation (Non-Medical): No  Physical Activity: Inactive (10/16/2023)   Exercise Vital Sign    Days of Exercise per Week: 0 days    Minutes of Exercise per Session: 0 min  Stress: No Stress Concern Present (10/16/2023)   [employer] of Occupational Health - Occupational Stress Questionnaire    Feeling of Stress : Not at all  Social Connections: Moderately Integrated (10/16/2023)   Social Connection and Isolation Panel [NHANES]    Frequency of Communication with Friends and Family: Once a week    Frequency of Social Gatherings with Friends and Family: Once a week    Attends Religious Services: More than 4 times per year    Active Member of [employer] or Organizations: Yes    Attends Banker Meetings: More than  4 times per year    Marital Status: Married    Tobacco Counseling Counseling given: Not Answered   Clinical Intake:  Pre-visit preparation completed: Yes  Pain : No/denies pain     BMI - recorded: 40.64 Nutritional Status: BMI > 30  Obese Nutritional Risks: None Diabetes: Yes CBG done?: Yes (CBG 86 Per patient) CBG resulted in Enter/ Edit results?: Yes Did pt. bring in CBG monitor from home?: No  How often do you need to have someone help you when you read instructions, pamphlets, or other written materials from your doctor or pharmacy?: 2 - Rarely  Interpreter Needed?: No  Information entered by :: Theresa Mulligan LPN   Activities of Daily Living    10/16/2023   11:21 AM 08/11/2023   10:28 PM  In your present state of health, do you have any difficulty performing the following activities:  Hearing? 1 0  Comment Wears hearing aids   Vision? 0 0  Difficulty concentrating or making decisions? 0 0  Walking or climbing stairs? 1 0  Comment Uses a Cane   Dressing or bathing? 0 0  Doing errands, shopping? 0 0  Preparing Food and eating ? N   Using the Toilet? N   In the past six months, have you accidently leaked urine? Y   Comment Wears breifs. Followed by medical attention   Do you have problems with loss of bowel control? Y   Comment Wears breifs. Followed by medical attention   Managing your Medications? N   Managing your Finances? N   Housekeeping or managing your Housekeeping? N     Patient Care Team: [name], MD as PCP - General (Family Medicine) Jonelle Sidle, MD as PCP - Cardiology (Cardiology) Bedelia Person, MD as Consulting Physician (Neurosurgery) Doreatha Massed, MD as Medical Oncologist (Hematology)  Indicate any recent Medical Services you may have received from other than [facility] providers in the past year (date may be approximate).     Assessment:   This is a routine wellness examination for [place].  Hearing/Vision  screen Hearing Screening - Comments:: Wears hearing aids Vision Screening - Comments:: Wears rx glasses - up to date with routine eye exams with  [facility]   Goals Addressed               This Visit's Progress     Lose weight (pt-stated)        Continue losing weight.       Depression Screen    10/19/2023    4:01 PM 10/05/2023    9:44 AM 09/12/2023    3:09 PM 08/24/2023    7:57 AM 07/05/2023    9:46 AM 04/05/2023    9:27 AM 02/16/2023    2:32 PM  PHQ 2/9 Scores  PHQ - 2 Score 0 0 1 0 0 0 0  PHQ- 9 Score 0  2 3   6     Fall Risk    10/19/2023    3:51 PM 10/16/2023   11:21 AM 10/05/2023    9:44 AM 09/12/2023    2:59 PM 07/05/2023    9:46 AM  Fall Risk   Falls in the past year? 1 1 1 1  0  Number falls in past yr: 0 0 1 1   Injury with Fall? 1 0 0 0   Comment Brain injury. followed by mewdical attention      Risk for fall due to :   History of fall(s) History of fall(s)  Follow up Falls prevention discussed  Falls evaluation completed Falls evaluation completed     MEDICARE RISK AT HOME: Medicare Risk at Home Any stairs in or around the home?: Yes If so, are there any without handrails?: No Home free of loose throw rugs in walkways, pet beds, electrical cords, etc?: Yes Adequate lighting in your home to reduce risk of falls?: Yes Life alert?: No Use of a cane, walker or w/c?: Yes Grab bars in the bathroom?: Yes Elevated toilet seat or a handicapped toilet?: Yes  TIMED UP AND GO:  Was the test performed?  No    Cognitive Function:        10/19/2023    3:53 PM 10/06/2022   10:50 AM 04/30/2019    2:41 PM  6CIT Screen  What Year? 0 points 0 points 0 points  What month? 0 points 0 points 0 points  What time? 0 points 0 points 0 points  Count back from 20 0 points 0 points 0 points  Months in reverse 0 points 0 points 0 points  Repeat phrase 0 points 0 points 0 points  Total Score 0 points 0 points 0 points    Immunizations Immunization  History  Administered Date(s) Administered   Fluad Quad(high Dose 65+) 10/10/2019, 09/28/2022   Fluad Trivalent(High Dose 65+) 09/12/2023   H1N1 09/25/2008   Influenza Split 10/17/2011, 10/09/2012, 03/16/2015   Influenza, High Dose Seasonal PF 09/27/2018, 10/07/2019, 10/08/2019, 09/10/2020, 10/18/2021   Influenza,inj,Quad PF,6+ Mos 09/21/2017   Influenza-Unspecified 03/14/2005, 12/08/2005, 09/25/2006, 09/26/2007, 09/25/2008, 11/30/2009, 08/26/2010, 09/26/2011, 08/26/2012, 09/25/2013, 09/26/2015, 09/25/2016, 09/25/2017, 09/09/2018, 09/27/2018, 10/10/2019   Moderna Covid-19 Fall Seasonal Vaccine 80yrs & older 12/15/2022, 09/13/2023   Moderna Covid-19 Vaccine Bivalent Booster 16yrs & up 10/18/2021   Moderna Sars-Covid-2 Vaccination 02/06/2020, 03/05/2020, 10/20/2020   Novel Infuenza-h1n1-09 10/14/2008   PNEUMOCOCCAL CONJUGATE-20 04/05/2023   Pneumococcal Conjugate-13 06/17/2016, 09/27/2018, 10/10/2018   Pneumococcal Polysaccharide-23 12/23/2010, 05/08/2013   Pneumococcal-Unspecified 03/14/2005   Respiratory Syncytial Virus Vaccine,Recomb Aduvanted(Arexvy) 12/15/2022   Tdap 03/13/2013, 05/08/2013, 02/03/2022   Zoster Recombinant(Shingrix) 10/20/2020, 02/10/2021, 03/23/2022   Zoster, Live 05/08/2013    TDAP status: Up to date  Flu Vaccine status: Up to date  Pneumococcal vaccine status: Up to date  Covid-19 vaccine status: Declined, Education has been provided regarding the importance of this vaccine but patient still declined. Advised may receive this vaccine at local pharmacy or Health Dept.or vaccine clinic. Aware to provide a copy of the vaccination record if obtained from local pharmacy or Health Dept. Verbalized acceptance and understanding.  Qualifies for Shingles Vaccine? Yes   Zostavax completed Yes   Shingrix Completed?: Yes  Screening Tests Health Maintenance  Topic Date Due   FOOT EXAM  Never done   OPHTHALMOLOGY EXAM  Never done   Colonoscopy  12/12/2023 (Originally  12/26/2022)   COVID-19 Vaccine (7 - 2023-24 season) 11/08/2023   Diabetic kidney evaluation - Urine ACR  01/05/2024   HEMOGLOBIN A1C  02/11/2024   Diabetic kidney evaluation - eGFR measurement  09/10/2024   Medicare Annual Wellness (AWV)  10/18/2024   DTaP/Tdap/Td (4 - Td or Tdap) 02/04/2032   Pneumonia Vaccine 63+ Years old  Completed   INFLUENZA VACCINE  Completed   Hepatitis C Screening  Completed   Zoster Vaccines- Shingrix  Completed   HPV VACCINES  Aged Out    Health Maintenance  Health Maintenance Due  Topic Date Due   FOOT EXAM  Never done   OPHTHALMOLOGY EXAM  Never done    Colorectal cancer  screening: Referral to GI placed Scheduled for 12/12/23. Pt aware the office will call re: appt.    Additional Screening:  Hepatitis C Screening: does qualify; Completed 01/13/23  Vision Screening: Recommended annual ophthalmology exams for early detection of glaucoma and other disorders of the eye. Is the patient up to date with their annual eye exam?  Yes  Who is the provider or what is the name of the office in which the patient attends annual eye exams? [facility] If pt is not established with a provider, would they like to be referred to a provider to establish care? No .   Dental Screening: Recommended annual dental exams for proper oral hygiene  Diabetic Foot Exam: Diabetic Foot Exam: Overdue, Pt has been advised about the importance in completing this exam. Pt is scheduled for diabetic foot exam on Followed by [facility]  Community Resource Referral / Chronic Care Management:  CRR required this visit?  No   CCM required this visit?  No     Plan:     I have personally reviewed and noted the following in the patient's chart:   Medical and social history Use of alcohol, tobacco or illicit drugs  Current medications and supplements including opioid prescriptions. Patient is currently taking opioid prescriptions. Information provided to patient regarding  non-opioid alternatives. Patient advised to discuss non-opioid treatment plan with their provider. Functional ability and status Nutritional status Physical activity Advanced directives List of other physicians Hospitalizations, surgeries, and ER visits in previous 12 months Vitals Screenings to include cognitive, depression, and falls Referrals and appointments  In addition, I have reviewed and discussed with patient certain preventive protocols, quality metrics, and best practice recommendations. A written personalized care plan for preventive services as well as general preventive health recommendations were provided to patient.     Tillie Rung, LPN   30/16/0109   After Visit Summary: (MyChart) Due to this being a telephonic visit, the after visit summary with patients personalized plan was offered to patient via MyChart   Nurse Notes: None

## 2023-11-08 ENCOUNTER — Other Ambulatory Visit: Payer: Self-pay | Admitting: Family Medicine

## 2023-11-15 ENCOUNTER — Other Ambulatory Visit: Payer: Self-pay

## 2023-11-15 DIAGNOSIS — D696 Thrombocytopenia, unspecified: Secondary | ICD-10-CM

## 2023-11-15 DIAGNOSIS — E611 Iron deficiency: Secondary | ICD-10-CM

## 2023-11-15 DIAGNOSIS — R161 Splenomegaly, not elsewhere classified: Secondary | ICD-10-CM

## 2023-11-16 ENCOUNTER — Inpatient Hospital Stay: Payer: Medicare Other | Attending: Hematology

## 2023-11-16 DIAGNOSIS — E611 Iron deficiency: Secondary | ICD-10-CM

## 2023-11-16 DIAGNOSIS — R161 Splenomegaly, not elsewhere classified: Secondary | ICD-10-CM

## 2023-11-16 DIAGNOSIS — D696 Thrombocytopenia, unspecified: Secondary | ICD-10-CM

## 2023-11-16 LAB — COMPREHENSIVE METABOLIC PANEL
ALT: 60 U/L — ABNORMAL HIGH (ref 0–44)
AST: 47 U/L — ABNORMAL HIGH (ref 15–41)
Albumin: 3.7 g/dL (ref 3.5–5.0)
Alkaline Phosphatase: 44 U/L (ref 38–126)
Anion gap: 6 (ref 5–15)
BUN: 31 mg/dL — ABNORMAL HIGH (ref 8–23)
CO2: 27 mmol/L (ref 22–32)
Calcium: 9.1 mg/dL (ref 8.9–10.3)
Chloride: 104 mmol/L (ref 98–111)
Creatinine, Ser: 1.16 mg/dL (ref 0.61–1.24)
GFR, Estimated: >60 mL/min (ref >60)
Glucose, Bld: 89 mg/dL (ref 70–99)
Potassium: 4.4 mmol/L (ref 3.5–5.1)
Sodium: 137 mmol/L (ref 135–145)
Total Bilirubin: 0.7 mg/dL (ref <1.2)
Total Protein: 6.6 g/dL (ref 6.5–8.1)

## 2023-11-16 LAB — FERRITIN: Ferritin: 93 ng/mL (ref 24–336)

## 2023-11-16 LAB — CBC WITH DIFFERENTIAL/PLATELET
Abs Immature Granulocytes: 0.02 10*3/uL (ref 0.00–0.07)
Basophils Absolute: 0 10*3/uL (ref 0.0–0.1)
Basophils Relative: 1 %
Eosinophils Absolute: 0.3 10*3/uL (ref 0.0–0.5)
Eosinophils Relative: 6 %
HCT: 39.5 % (ref 39.0–52.0)
Hemoglobin: 13.2 g/dL (ref 13.0–17.0)
Immature Granulocytes: 0 %
Lymphocytes Relative: 17 %
Lymphs Abs: 0.8 10*3/uL (ref 0.7–4.0)
MCH: 33.8 pg (ref 26.0–34.0)
MCHC: 33.4 g/dL (ref 30.0–36.0)
MCV: 101 fL — ABNORMAL HIGH (ref 80.0–100.0)
Monocytes Absolute: 0.5 10*3/uL (ref 0.1–1.0)
Monocytes Relative: 10 %
Neutro Abs: 3.2 10*3/uL (ref 1.7–7.7)
Neutrophils Relative %: 66 %
Platelets: 103 10*3/uL — ABNORMAL LOW (ref 150–400)
RBC: 3.91 MIL/uL — ABNORMAL LOW (ref 4.22–5.81)
RDW: 13.4 % (ref 11.5–15.5)
WBC: 4.8 10*3/uL (ref 4.0–10.5)
nRBC: 0 % (ref 0.0–0.2)

## 2023-11-16 LAB — IRON AND TIBC
Iron: 77 ug/dL (ref 45–182)
Saturation Ratios: 29 % (ref 17.9–39.5)
TIBC: 269 ug/dL (ref 250–450)
UIBC: 192 ug/dL

## 2023-11-16 LAB — LACTATE DEHYDROGENASE: LDH: 151 U/L (ref 98–192)

## 2023-11-20 ENCOUNTER — Other Ambulatory Visit: Payer: Self-pay | Admitting: *Deleted

## 2023-11-20 MED ORDER — GLIMEPIRIDE 2 MG PO TABS: 2.0000 mg | ORAL_TABLET | Freq: Every day | ORAL | 0 refills | Status: DC

## 2023-11-21 NOTE — Telephone Encounter (Signed)
Care team updated and letter sent for eye exam notes.

## 2023-12-07 ENCOUNTER — Other Ambulatory Visit: Payer: Self-pay | Admitting: Family Medicine

## 2023-12-11 ENCOUNTER — Encounter: Payer: Self-pay | Admitting: Family Medicine

## 2023-12-11 NOTE — Progress Notes (Signed)
Encounter Notes: All associated encounter notes  This section contains the clinical notes associated to the Encounter.   Encounter Notes: All associated encounter notes Date/Time Encounter Note(s) Provider Source  May 11, 2023 03:59 PM EYE NOTE: LOCAL TITLE: EYE CLINIC TRIAGE NOTE (T) STANDARD TITLE: EYE NOTE DATE OF NOTE: May 11, 2023@15 :59 ENTRY DATE: May 11, 2023@15 :59:25 AUTHOR: HUNT,LAUREN R EXP COSIGNER: URGENCY: STATUS: COMPLETED  OUTSIDE MEDICAL RECORDS  DATE OF EYE EXAMINATION:[facility]   LOCATION OF EXAM OR PROVIDER:01/19/23   A/P: 1. moderate NPDR OU 2. PVD OS 3. ERM OS 4. HTN RETINOPATHY OU     OTHER ACTIONS: Records submitted for scanning into [facility]  /es/ Hazle Quant, OD Section Chief, [facility] Signed: 05/11/2023 16:01 HUNT,LAUREN R [facility]

## 2023-12-12 ENCOUNTER — Other Ambulatory Visit: Payer: Self-pay | Admitting: Family Medicine

## 2023-12-12 DIAGNOSIS — I10 Essential (primary) hypertension: Secondary | ICD-10-CM

## 2023-12-12 DIAGNOSIS — N1832 Chronic kidney disease, stage 3b: Secondary | ICD-10-CM

## 2023-12-12 DIAGNOSIS — I509 Heart failure, unspecified: Secondary | ICD-10-CM

## 2023-12-29 ENCOUNTER — Other Ambulatory Visit: Payer: Self-pay | Admitting: Family Medicine

## 2023-12-29 ENCOUNTER — Encounter: Payer: Self-pay | Admitting: Family Medicine

## 2023-12-29 DIAGNOSIS — I10 Essential (primary) hypertension: Secondary | ICD-10-CM

## 2023-12-29 DIAGNOSIS — N1832 Chronic kidney disease, stage 3b: Secondary | ICD-10-CM

## 2023-12-29 DIAGNOSIS — M1A00X0 Idiopathic chronic gout, unspecified site, without tophus (tophi): Secondary | ICD-10-CM

## 2023-12-29 DIAGNOSIS — N400 Enlarged prostate without lower urinary tract symptoms: Secondary | ICD-10-CM

## 2023-12-29 DIAGNOSIS — I509 Heart failure, unspecified: Secondary | ICD-10-CM

## 2023-12-29 DIAGNOSIS — E782 Mixed hyperlipidemia: Secondary | ICD-10-CM

## 2024-01-09 ENCOUNTER — Ambulatory Visit: Payer: Medicare Other | Admitting: Family Medicine

## 2024-01-18 ENCOUNTER — Encounter: Payer: Self-pay | Admitting: Family Medicine

## 2024-01-18 ENCOUNTER — Ambulatory Visit: Payer: Medicare Other | Admitting: Family Medicine

## 2024-01-18 VITALS — BP 144/64 | HR 57 | Temp 97.5°F | Ht 73.0 in | Wt 332.0 lb

## 2024-01-18 DIAGNOSIS — E1122 Type 2 diabetes mellitus with diabetic chronic kidney disease: Secondary | ICD-10-CM | POA: Diagnosis not present

## 2024-01-18 DIAGNOSIS — N1831 Chronic kidney disease, stage 3a: Secondary | ICD-10-CM | POA: Diagnosis not present

## 2024-01-18 DIAGNOSIS — E1169 Type 2 diabetes mellitus with other specified complication: Secondary | ICD-10-CM

## 2024-01-18 DIAGNOSIS — E785 Hyperlipidemia, unspecified: Secondary | ICD-10-CM | POA: Diagnosis not present

## 2024-01-18 DIAGNOSIS — I480 Paroxysmal atrial fibrillation: Secondary | ICD-10-CM | POA: Diagnosis not present

## 2024-01-18 DIAGNOSIS — J449 Chronic obstructive pulmonary disease, unspecified: Secondary | ICD-10-CM

## 2024-01-18 DIAGNOSIS — N184 Chronic kidney disease, stage 4 (severe): Secondary | ICD-10-CM

## 2024-01-18 DIAGNOSIS — I10 Essential (primary) hypertension: Secondary | ICD-10-CM | POA: Diagnosis not present

## 2024-01-18 DIAGNOSIS — N1832 Chronic kidney disease, stage 3b: Secondary | ICD-10-CM | POA: Diagnosis not present

## 2024-01-18 LAB — CMP14+EGFR
ALT: 81 [IU]/L — ABNORMAL HIGH (ref 0–44)
AST: 66 [IU]/L — ABNORMAL HIGH (ref 0–40)
Albumin: 4.3 g/dL (ref 3.9–4.9)
Alkaline Phosphatase: 50 [IU]/L (ref 44–121)
BUN/Creatinine Ratio: 27 — ABNORMAL HIGH (ref 10–24)
BUN: 35 mg/dL — ABNORMAL HIGH (ref 8–27)
Bilirubin Total: 0.7 mg/dL (ref 0.0–1.2)
CO2: 23 mmol/L (ref 20–29)
Calcium: 9.7 mg/dL (ref 8.6–10.2)
Chloride: 103 mmol/L (ref 96–106)
Creatinine, Ser: 1.28 mg/dL — ABNORMAL HIGH (ref 0.76–1.27)
Globulin, Total: 2.4 g/dL (ref 1.5–4.5)
Glucose: 105 mg/dL — ABNORMAL HIGH (ref 70–99)
Potassium: 4.6 mmol/L (ref 3.5–5.2)
Sodium: 141 mmol/L (ref 134–144)
Total Protein: 6.7 g/dL (ref 6.0–8.5)
eGFR: 60 mL/min/{1.73_m2} (ref >59)

## 2024-01-18 LAB — CBC WITH DIFFERENTIAL/PLATELET
Basophils Absolute: 0 10*3/uL (ref 0.0–0.2)
Basos: 1 %
EOS (ABSOLUTE): 0.3 10*3/uL (ref 0.0–0.4)
Eos: 7 %
Hematocrit: 42.7 % (ref 37.5–51.0)
Hemoglobin: 14.3 g/dL (ref 13.0–17.7)
Immature Grans (Abs): 0 10*3/uL (ref 0.0–0.1)
Immature Granulocytes: 0 %
Lymphocytes Absolute: 1 10*3/uL (ref 0.7–3.1)
Lymphs: 24 %
MCH: 33.4 pg — ABNORMAL HIGH (ref 26.6–33.0)
MCHC: 33.5 g/dL (ref 31.5–35.7)
MCV: 100 fL — ABNORMAL HIGH (ref 79–97)
Monocytes Absolute: 0.4 10*3/uL (ref 0.1–0.9)
Monocytes: 10 %
Neutrophils Absolute: 2.4 10*3/uL (ref 1.4–7.0)
Neutrophils: 58 %
Platelets: 123 10*3/uL — ABNORMAL LOW (ref 150–450)
RBC: 4.28 x10E6/uL (ref 4.14–5.80)
RDW: 13.3 % (ref 11.6–15.4)
WBC: 4.1 10*3/uL (ref 3.4–10.8)

## 2024-01-18 LAB — BAYER DCA HB A1C WAIVED: HB A1C (BAYER DCA - WAIVED): 5.4 % (ref 4.8–5.6)

## 2024-01-18 MED ORDER — PREGABALIN 50 MG PO CAPS: ORAL_CAPSULE | ORAL | 0 refills | Status: DC

## 2024-01-18 MED ORDER — DICLOFENAC SODIUM 1 % EX CREA: 4.0000 g | TOPICAL_CREAM | Freq: Four times a day (QID) | CUTANEOUS | 5 refills | Status: DC

## 2024-01-18 MED ORDER — HYDROCODONE-ACETAMINOPHEN 5-325 MG PO TABS: 1.0000 | ORAL_TABLET | Freq: Two times a day (BID) | ORAL | 0 refills | Status: DC

## 2024-01-18 NOTE — Progress Notes (Signed)
Subjective:  Patient ID: Mitchell Jarred.,  male    DOB: March 26, 1951  Age: 73 y.o.    CC: Diabetes (Meds pending. ) and Shoulder Pain (Jan 12 woke up with severe pain and stinging. No injury. [facility], prednisone, and muscle relaxer's. Hot, cold, and massage not helping either. Starts in neck and goes all the way done to elbow. Medication have ran out)   HPI Mitchell WENZLICK Sr. presents for  follow-up of hypertension. Patient has no history of headache chest pain or shortness of breath or recent cough. Patient also denies symptoms of TIA such as numbness weakness lateralizing. Patient denies side effects from medication. States taking it regularly.  Patient also  in for follow-up of elevated cholesterol. Doing well without complaints on current medication. Denies side effects  including myalgia and arthralgia and nausea. Also in today for liver function testing. Currently no chest pain, shortness of breath or other cardiovascular related symptoms noted.  Follow-up of diabetes. No glucose readings available.  Patient denies symptoms such as excessive hunger or urinary frequency, excessive hunger, nausea No significant hypoglycemic spells noted. Medications reviewed. Pt reports taking them regularly. Pt. denies complication/adverse reaction today. Needs assistance for ozempic.   Colonoscopy through [place] done Dec 17. Diverticulosis and no polyps.   Awoke 10 days ago with shoulder pain radiating to the neck and elbow. No relief. Wearing a sling. Interferes with sleep.    History Mitchell Parker has a past medical history of Chronic diarrhea, CKD (chronic kidney disease) stage 3, GFR 30-59 ml/min (HCC), COPD (chronic obstructive pulmonary disease) (HCC), Dysrhythmia (02/2022), Essential hypertension, Gallstones, Gout, History of CHF (congestive heart failure), History of colonic polyps, History of DVT (deep vein thrombosis) (04/19/2012), Iron deficiency (02/16/2023), Kidney stones, Lumbar  disc disease, Neuropathy, Nonischemic cardiomyopathy (HCC) (2003), Obesity, Orthostatic hypotension, Presence of Watchman left atrial appendage closure device (02/02/2023), RA (rheumatoid arthritis) (HCC), Sleep apnea, Subarachnoid hemorrhage (HCC) (02/03/2022), Subdural hematoma (HCC) (02/03/2022), Syncope, vasovagal, and Type 2 diabetes mellitus (HCC).   He has a past surgical history that includes Umbilical hernia repair; Pilonidal cyst excision (1997); Varicose vein surgery; Cholecystectomy (01/18/2012); Bariatric Surgery (01/11/2018); Eye surgery (Right); [facility] (Right, 05/04/2022); Toe amputation (Right); LEFT ATRIAL APPENDAGE OCCLUSION (N/A, 02/02/2023); TEE without cardioversion (N/A, 02/02/2023); Colonoscopy (05/2017); watchman implant; and I & D extremity (Right, 08/12/2023).   His family history includes Diabetes in his brother and father; Heart disease in his father; Hypertension in his father and mother; Stroke in his mother.He reports that he quit smoking about 29 years ago. His smoking use included cigarettes. He started smoking about 54 years ago. He has a 50 pack-year smoking history. He has never used smokeless tobacco. He reports that he does not drink alcohol and does not use drugs.  Current Outpatient Medications on File Prior to Visit  Medication Sig Dispense Refill   acetaminophen (TYLENOL) 500 MG tablet Take 500 mg by mouth every 6 (six) hours as needed for mild pain.     allopurinol (ZYLOPRIM) 300 MG tablet TAKE 1 TABLET BY MOUTH EVERY  MORNING 100 tablet 0   amLODipine-olmesartan (AZOR) 10-40 MG tablet Take 1 tablet by mouth daily. 100 tablet 2   atorvastatin (LIPITOR) 80 MG tablet TAKE 1 TABLET BY MOUTH DAILY AT  6 PM 100 tablet 0   calcitRIOL (ROCALTROL) 0.25 MCG capsule TAKE 1 CAPSULE BY MOUTH DAILY  WITH BREAKFAST 100 capsule 0   calcium carbonate (OSCAL) 1500 (600 Ca) MG TABS tablet Take  600 mg of elemental calcium by mouth 2 (two) times daily with a meal.      carvedilol (COREG) 6.25 MG tablet TAKE ONE-HALF TABLET BY MOUTH  EVERY MORNING AND 1 TABLET EVERY EVENING 150 tablet 0   celecoxib (CELEBREX) 200 MG capsule TAKE 1 CAPSULE BY MOUTH DAILY  WITH FOOD 100 capsule 0   cloNIDine (CATAPRES) 0.2 MG tablet TAKE 1 TABLET BY MOUTH TWICE  DAILY FOR BLOOD PRESSURE 200 tablet 0   Colchicine 0.6 MG CAPS Take 1 capsule by mouth daily. (Patient taking differently: Take 1 capsule by mouth as needed (gout flareups).) 90 capsule 2   cyclobenzaprine (FLEXERIL) 10 MG tablet Take by mouth.     DULoxetine (CYMBALTA) 30 MG capsule TAKE 1 CAPSULE BY MOUTH TWICE  DAILY 200 capsule 0   ferrous sulfate 325 (65 FE) MG tablet Take 325 mg by mouth in the morning.     fluocinolone (SYNALAR) 0.01 % external solution Place 3-5 drops in each ear canal twice daily (Patient taking differently: 3-5 drops 2 (two) times daily as needed (irritation/infection.). Place 3-5 drops in each ear canal twice daily) 60 mL 5   furosemide (LASIX) 40 MG tablet TAKE 1 TABLET BY MOUTH IN THE  MORNING 100 tablet 0   hydroxychloroquine (PLAQUENIL) 200 MG tablet TAKE 1 TABLET BY MOUTH IN THE  MORNING AND AT BEDTIME 200 tablet 2   hydrOXYzine (VISTARIL) 50 MG capsule Take 1 capsule (50 mg total) by mouth 3 (three) times daily as needed for nausea or vomiting (and vertigo). 30 capsule 0   Multiple Vitamin (MULTIVITAMIN WITH MINERALS) TABS tablet Take 1 tablet by mouth in the morning.     oxyCODONE (OXY IR/ROXICODONE) 5 MG immediate release tablet Take 1 tablet (5 mg total) by mouth every 4 (four) hours as needed for moderate pain, severe pain or breakthrough pain. 30 tablet 0   pioglitazone (ACTOS) 30 MG tablet TAKE 1 TABLET BY MOUTH DAILY 100 tablet 0   potassium chloride SA (KLOR-CON M) 20 MEQ tablet TAKE 1 TABLET BY MOUTH DAILY FOR POTASSIUM REPLACEMENT/  SUPPLEMENT 100 tablet 2   predniSONE (DELTASONE) 20 MG tablet Take by mouth.     silver sulfADIAZINE (SILVADENE) 1 % cream Apply topically as needed.      tamsulosin (FLOMAX) 0.4 MG CAPS capsule TAKE 1 CAPSULE BY MOUTH AT  BEDTIME 100 capsule 0   vitamin B-12 (CYANOCOBALAMIN) 500 MCG tablet Take 500 mcg by mouth in the morning.     No current facility-administered medications on file prior to visit.    ROS Review of Systems  Constitutional: Negative.  Negative for fever.  HENT: Negative.    Eyes:  Negative for visual disturbance.  Respiratory:  Negative for cough and shortness of breath.   Cardiovascular:  Negative for chest pain and leg swelling.  Gastrointestinal:  Negative for abdominal pain, diarrhea, nausea and vomiting.  Genitourinary:  Negative for difficulty urinating.  Musculoskeletal:  Positive for arthralgias ([facility] Negative for myalgias.  Skin:  Negative for rash.  Neurological:  Negative for headaches.  Psychiatric/Behavioral:  Negative for sleep disturbance.     Objective:  BP (!) 144/64   Pulse (!) 57   Temp (!) 97.5 F (36.4 C)   Ht 6\' 1"  (1.854 m)   Wt (!) 332 lb (150.6 kg)   SpO2 100%   BMI 43.80 kg/m   BP Readings from Last 3 Encounters:  01/18/24 (!) 144/64  10/05/23 133/61  08/24/23 (!) 102/54  Wt Readings from Last 3 Encounters:  01/18/24 (!) 332 lb (150.6 kg)  10/19/23 (!) 308 lb (139.7 kg)  10/05/23 (!) 321 lb (145.6 kg)     Physical Exam Constitutional:      General: He is not in acute distress.    Appearance: He is well-developed.  HENT:     Head: Normocephalic and atraumatic.     Right Ear: External ear normal.     Left Ear: External ear normal.     Nose: Nose normal.  Eyes:     Conjunctiva/sclera: Conjunctivae normal.     Pupils: Pupils are equal, round, and reactive to light.  Cardiovascular:     Rate and Rhythm: Normal rate and regular rhythm.     Heart sounds: Normal heart sounds. No murmur heard. Pulmonary:     Effort: Pulmonary effort is normal. No respiratory distress.     Breath sounds: Normal breath sounds. No wheezing or rales.   Abdominal:     Palpations: Abdomen is soft.     Tenderness: There is no abdominal tenderness.  Musculoskeletal:        General: Normal range of motion.     Cervical back: Normal range of motion and neck supple.  Skin:    General: Skin is warm and dry.  Neurological:     Mental Status: He is alert and oriented to person, place, and time.     Deep Tendon Reflexes: Reflexes are normal and symmetric.  Psychiatric:        Behavior: Behavior normal.        Thought Content: Thought content normal.        Judgment: Judgment normal.        Lab Results  Component Value Date   HGBA1C 6.1 (H) 08/11/2023   HGBA1C 5.8 (H) 07/05/2023   HGBA1C 5.9 (H) 04/05/2023    Assessment & Plan:   Mitchell "Bill" was seen today for diabetes and shoulder pain.  Diagnoses and all orders for this visit:  Paroxysmal atrial fibrillation (HCC) -     CMP14+EGFR  Type 2 diabetes mellitus with stage 3b chronic kidney disease, without long-term current use of insulin (HCC) -     Bayer DCA Hb A1c Waived -     AMB Referral [facility] Care Management  Chronic obstructive pulmonary disease, unspecified COPD type (HCC) -     CBC with Differential/Platelet -     CMP14+EGFR  Primary hypertension -     CBC with Differential/Platelet  Chronic renal impairment, stage 3a (HCC) -     Bayer DCA Hb A1c Waived -     CBC with Differential/Platelet -     CMP14+EGFR  Hyperlipidemia associated with type 2 diabetes mellitus (HCC) -     Bayer DCA Hb A1c Waived  Other orders -     HYDROcodone-acetaminophen (NORCO) 5-325 MG tablet; Take 1 tablet by mouth 2 (two) times daily after a meal. Take for moderate to severe pain -     Diclofenac Sodium 1 % CREA; Apply 4 g topically in the morning, at noon, in the evening, and at bedtime. -     pregabalin (LYRICA) 50 MG capsule; 1 qhs X7 days , then 2 qhs X 7d, then 3 qhs X 7d, then 4 qhs   I have discontinued [name] "Bill"'s glimepiride. I am also having him start on  HYDROcodone-acetaminophen, Diclofenac Sodium, and pregabalin. Additionally, I am having him maintain his calcium carbonate, vitamin B-12, acetaminophen, ferrous sulfate, Colchicine, fluocinolone, DULoxetine,  multivitamin with minerals, silver sulfADIAZINE, amLODipine-olmesartan, potassium chloride SA, celecoxib, oxyCODONE, hydrOXYzine, hydroxychloroquine, pioglitazone, carvedilol, cloNIDine, calcitRIOL, furosemide, allopurinol, atorvastatin, tamsulosin, predniSONE, and cyclobenzaprine.  Meds ordered this encounter  Medications   HYDROcodone-acetaminophen (NORCO) 5-325 MG tablet    Sig: Take 1 tablet by mouth 2 (two) times daily after a meal. Take for moderate to severe pain    Dispense:  60 tablet    Refill:  0   Diclofenac Sodium 1 % CREA    Sig: Apply 4 g topically in the morning, at noon, in the evening, and at bedtime.    Dispense:  360 g    Refill:  5   pregabalin (LYRICA) 50 MG capsule    Sig: 1 qhs X7 days , then 2 qhs X 7d, then 3 qhs X 7d, then 4 qhs    Dispense:  120 capsule    Refill:  0     Follow-up: Return in about 1 month (around 02/18/2024), or if symptoms worsen or fail to improve.  [name], M.D.

## 2024-01-19 ENCOUNTER — Encounter: Payer: Self-pay | Admitting: Family Medicine

## 2024-01-19 NOTE — Progress Notes (Signed)
Hello Dmetrius,  Your lab result is normal and/or stable.Some minor variations that are not significant are commonly marked abnormal, but do not represent any medical problem for you.  Best regards, [name], M.D.

## 2024-01-23 ENCOUNTER — Encounter: Payer: Self-pay | Admitting: Family Medicine

## 2024-01-24 ENCOUNTER — Telehealth: Payer: Self-pay

## 2024-01-24 NOTE — Progress Notes (Signed)
Care Guide Pharmacy Note  01/24/2024 Name: Mitchell CARCIONE Sr. MRN: [id number] DOB: Jul 20, 1951  Referred By: [name], MD Reason for referral: Care Coordination (Outreach to schedule with Pharm d )   Mitchell Jarred. is a 73 y.o. year old male who is a primary care patient of Stacks, Broadus John, MD.  Mitchell Rod Sr. was referred to the pharmacist for assistance related to: DMII  Successful contact was made with the patient to discuss pharmacy services including being ready for the pharmacist to call at least 5 minutes before the scheduled appointment time and to have medication bottles and any blood pressure readings ready for review. The patient agreed to meet with the pharmacist via telephone visit on (date/time).02/23/2024  Penne Lash , RMA     [facility] Guide  Direct Dial: [phone]  Website: [email or web address]

## 2024-01-30 ENCOUNTER — Telehealth: Payer: Self-pay

## 2024-01-30 ENCOUNTER — Encounter: Payer: Self-pay | Admitting: Hematology

## 2024-01-30 ENCOUNTER — Other Ambulatory Visit (HOSPITAL_COMMUNITY): Payer: Self-pay

## 2024-01-31 NOTE — Progress Notes (Signed)
 Pharmacy Medication Assistance Program Note    03/08/2024  Patient ID: Mitchell Gatt., male   DOB: November 24, 1951, 73 y.o.   MRN: [id number]     01/30/2024  Outreach Medication One  Manufacturer Medication One Novo Nordisk  Nordisk Drugs Ozempic   Dose of Ozempic  0.5MG   Type of Radiographer, Therapeutic Assistance  Date Application Sent to Patient 01/31/2024  Application Items Requested Application;Proof of Income  Date Application Received From Patient 02/13/2024  Application Items Received From Patient Application;Proof of Income  Date Application Received From Provider 02/20/2024  Date Application Submitted to Manufacturer 02/27/2024  Method Application Sent to Manufacturer Fax  Patient Assistance Determination Approved  Approval Start Date 03/07/2024  Approval End Date 03/02/2025     NEW - APPROVED

## 2024-02-02 ENCOUNTER — Other Ambulatory Visit: Payer: Self-pay | Admitting: Family Medicine

## 2024-02-05 ENCOUNTER — Ambulatory Visit: Payer: Self-pay | Admitting: Family Medicine

## 2024-02-05 NOTE — Telephone Encounter (Addendum)
 Copied from CRM [id number]. Topic: Clinical - Red Word Triage >> Feb 05, 2024  8:56 AM Antwanette L wrote: Red Word that prompted transfer to Nurse Triage: Patient has a sore on his upper left leg that is oozing blood  Chief Complaint: sore/wound in "crotch" area Symptoms: wound, oozing blood Frequency: constant Pertinent Negatives: Patient denies fever Disposition: [] ED /[x] Urgent Care (no appt availability in office) / [] Appointment(In office/virtual)/ []  [place] [facility]/ [] Home Care/ [] Refused Recommended Disposition /[] [place] [facility]/ []  Follow-up with PCP Additional Notes: No apt available for today.  Instructed to go to the UC.  Care advice given, denies questions, office updated.   Reason for Disposition  [1] Looks infected (spreading redness, pus) AND [2] large red area (> 2 in. or 5 cm)  Answer Assessment - Initial Assessment Questions 1. APPEARANCE of SORES: "What do the sores look like?"     Near crotch, in front 2. NUMBER: "How many sores are there?"     one 3. SIZE: "How big is the largest sore?"     Close to fist size now, started late Friday evening and has gotten worse. 4. LOCATION: "Where are the sores located?"     crotch 5. ONSET: "When did the sores begin?"     friday 6. TENDER: "Does it hurt when you touch it?"  (Scale 1-10; or mild, moderate, severe)      Stings a little when I walk 7. CAUSE: "What do you think is causing the sores?"     unknown 8. OTHER SYMPTOMS: "Do you have any other symptoms?" (e.g., fever, new weakness)     Denies.  Protocols used: [employer]

## 2024-02-07 ENCOUNTER — Ambulatory Visit: Payer: Medicare Other | Attending: Cardiology | Admitting: Cardiology

## 2024-02-07 ENCOUNTER — Other Ambulatory Visit: Payer: Self-pay

## 2024-02-07 ENCOUNTER — Encounter: Payer: Self-pay | Admitting: Cardiology

## 2024-02-07 ENCOUNTER — Encounter (HOSPITAL_COMMUNITY): Payer: Self-pay

## 2024-02-07 ENCOUNTER — Emergency Department (HOSPITAL_COMMUNITY)
Admission: EM | Admit: 2024-02-07 | Discharge: 2024-02-07 | Disposition: A | Discharge status: Home / Self Care | Payer: No Typology Code available for payment source

## 2024-02-07 ENCOUNTER — Emergency Department (HOSPITAL_COMMUNITY): Payer: No Typology Code available for payment source

## 2024-02-07 VITALS — BP 128/58 | HR 52 | Ht 73.0 in | Wt 339.0 lb

## 2024-02-07 DIAGNOSIS — M47812 Spondylosis without myelopathy or radiculopathy, cervical region: Secondary | ICD-10-CM | POA: Diagnosis not present

## 2024-02-07 DIAGNOSIS — I483 Typical atrial flutter: Secondary | ICD-10-CM

## 2024-02-07 DIAGNOSIS — S3994XA Unspecified injury of external genitals, initial encounter: Secondary | ICD-10-CM | POA: Diagnosis present

## 2024-02-07 DIAGNOSIS — I5032 Chronic diastolic (congestive) heart failure: Secondary | ICD-10-CM

## 2024-02-07 DIAGNOSIS — Y9383 Activity, rough housing and horseplay: Secondary | ICD-10-CM | POA: Insufficient documentation

## 2024-02-07 DIAGNOSIS — W500XXA Accidental hit or strike by another person, initial encounter: Secondary | ICD-10-CM | POA: Diagnosis not present

## 2024-02-07 DIAGNOSIS — R931 Abnormal findings on diagnostic imaging of heart and coronary circulation: Secondary | ICD-10-CM | POA: Diagnosis not present

## 2024-02-07 DIAGNOSIS — I10 Essential (primary) hypertension: Secondary | ICD-10-CM

## 2024-02-07 DIAGNOSIS — Z23 Encounter for immunization: Secondary | ICD-10-CM | POA: Insufficient documentation

## 2024-02-07 DIAGNOSIS — S31501A Unspecified open wound of unspecified external genital organs, male, initial encounter: Secondary | ICD-10-CM | POA: Insufficient documentation

## 2024-02-07 DIAGNOSIS — L02214 Cutaneous abscess of groin: Secondary | ICD-10-CM | POA: Insufficient documentation

## 2024-02-07 LAB — BASIC METABOLIC PANEL
Anion gap: 9 (ref 5–15)
BUN: 36 mg/dL — ABNORMAL HIGH (ref 8–23)
CO2: 26 mmol/L (ref 22–32)
Calcium: 9 mg/dL (ref 8.9–10.3)
Chloride: 101 mmol/L (ref 98–111)
Creatinine, Ser: 1.28 mg/dL — ABNORMAL HIGH (ref 0.61–1.24)
GFR, Estimated: >60 mL/min (ref >60)
Glucose, Bld: 146 mg/dL — ABNORMAL HIGH (ref 70–99)
Potassium: 4.4 mmol/L (ref 3.5–5.1)
Sodium: 136 mmol/L (ref 135–145)

## 2024-02-07 LAB — CBC WITH DIFFERENTIAL/PLATELET
Abs Immature Granulocytes: 0.02 10*3/uL (ref 0.00–0.07)
Basophils Absolute: 0 10*3/uL (ref 0.0–0.1)
Basophils Relative: 1 %
Eosinophils Absolute: 0.4 10*3/uL (ref 0.0–0.5)
Eosinophils Relative: 8 %
HCT: 37.2 % — ABNORMAL LOW (ref 39.0–52.0)
Hemoglobin: 12.4 g/dL — ABNORMAL LOW (ref 13.0–17.0)
Immature Granulocytes: 0 %
Lymphocytes Relative: 17 %
Lymphs Abs: 0.9 10*3/uL (ref 0.7–4.0)
MCH: 33.2 pg (ref 26.0–34.0)
MCHC: 33.3 g/dL (ref 30.0–36.0)
MCV: 99.5 fL (ref 80.0–100.0)
Monocytes Absolute: 0.6 10*3/uL (ref 0.1–1.0)
Monocytes Relative: 12 %
Neutro Abs: 3.2 10*3/uL (ref 1.7–7.7)
Neutrophils Relative %: 62 %
Platelets: 143 10*3/uL — ABNORMAL LOW (ref 150–400)
RBC: 3.74 MIL/uL — ABNORMAL LOW (ref 4.22–5.81)
RDW: 13.1 % (ref 11.5–15.5)
WBC: 5.2 10*3/uL (ref 4.0–10.5)
nRBC: 0 % (ref 0.0–0.2)

## 2024-02-07 MED ORDER — TETANUS-DIPHTH-ACELL PERTUSSIS 5-2.5-18.5 LF-MCG/0.5 IM SUSY
0.5000 mL | PREFILLED_SYRINGE | Freq: Once | INTRAMUSCULAR | Status: AC
  Administered 2024-02-07: 0.5 mL via INTRAMUSCULAR
  Filled 2024-02-07: qty 0.5

## 2024-02-07 MED ORDER — KETOROLAC TROMETHAMINE 30 MG/ML IJ SOLN
15.0000 mg | Freq: Once | INTRAMUSCULAR | Status: AC
  Administered 2024-02-07: 15 mg via INTRAVENOUS
  Filled 2024-02-07: qty 1

## 2024-02-07 MED ORDER — LIDOCAINE HCL (PF) 2 % IJ SOLN
INTRAMUSCULAR | Status: AC
  Filled 2024-02-07: qty 5

## 2024-02-07 MED ORDER — HYDROCODONE-ACETAMINOPHEN 5-325 MG PO TABS: 1.0000 | ORAL_TABLET | Freq: Four times a day (QID) | ORAL | 0 refills | Status: DC | PRN

## 2024-02-07 MED ORDER — LIDOCAINE HCL (PF) 2 % IJ SOLN: 5.0000 mL | Freq: Once | INTRAMUSCULAR | Status: DC

## 2024-02-07 MED ORDER — DOXYCYCLINE HYCLATE 100 MG PO TABS
100.0000 mg | ORAL_TABLET | Freq: Once | ORAL | Status: AC
  Administered 2024-02-07: 100 mg via ORAL
  Filled 2024-02-07: qty 1

## 2024-02-07 MED ORDER — IOHEXOL 300 MG/ML  SOLN
100.0000 mL | Freq: Once | INTRAMUSCULAR | Status: AC | PRN
  Administered 2024-02-07: 100 mL via INTRAVENOUS

## 2024-02-07 MED ORDER — POVIDONE-IODINE 10 % EX SOLN
CUTANEOUS | Status: DC | PRN
  Filled 2024-02-07: qty 29.6

## 2024-02-07 MED ORDER — DOXYCYCLINE HYCLATE 100 MG PO CAPS: 100.0000 mg | ORAL_CAPSULE | Freq: Two times a day (BID) | ORAL | 0 refills | Status: DC
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
## 2024-02-07 NOTE — ED Notes (Signed)
Waiting on CT to bring over CD

## 2024-02-07 NOTE — ED Provider Notes (Incomplete)
[place] EMERGENCY DEPARTMENT AT [facility] Provider Note   CSN: [id number] Arrival date & time: 02/07/24  4098     History {Add pertinent medical, surgical, social history, OB history to HPI:1} Chief Complaint  Patient presents with  . Wound Check    Mitchell Parker. is a 73 y.o. 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  Describes a simple abrasion which has since developing a worsening wound with swelling, redness and purulent drainage. He states this site has also been swollen with scar tissue since having vein surgery in his left leg,  access from the groin.   He has applied mercurochrome and has changed the dressing daily. Denies fevers or chills, no n/v, states his cbg's have been under good control.  Not sure of tetanus status.   Also,  reports chronic pain in his neck,  now with radiation of pain to his left elbow area, described as an aching pain,  constant but worse x 1 month. No new injury. Denies weakness in his arm or hand.  [facility] [place] regarding the neck issue, stating he expects he will need neurosurgical referral.     The history is provided by the patient.       Home Medications Prior to Admission medications   Medication Sig Start Date End Date Taking? Authorizing Provider  doxycycline (VIBRAMYCIN) 100 MG capsule Take 1 capsule (100 mg total) by mouth 2 (two) times daily. 02/07/24  Yes Shalina Norfolk, Raynelle Fanning, PA-C  HYDROcodone-acetaminophen (NORCO/VICODIN) 5-325 MG tablet Take 1 tablet by mouth every 6 (six) hours as needed for severe pain (pain score 7-10). 02/07/24  Yes Anasophia Pecor, Raynelle Fanning, PA-C  acetaminophen (TYLENOL) 500 MG tablet Take 500 mg by mouth every 6 (six) hours as needed for mild pain.    [provider]  allopurinol (ZYLOPRIM) 300 MG  tablet TAKE 1 TABLET BY MOUTH EVERY  MORNING 12/29/23   [name], MD  amLODipine-olmesartan (AZOR) 10-40 MG tablet Take 1 tablet by mouth daily. 04/05/23   [name], MD  atorvastatin (LIPITOR) 80 MG tablet TAKE 1 TABLET BY MOUTH DAILY AT  6 PM 12/29/23   [name], MD  calcitRIOL (ROCALTROL) 0.25 MCG capsule TAKE 1 CAPSULE BY MOUTH DAILY  WITH BREAKFAST 12/29/23   [name], MD  calcium carbonate (OSCAL) 1500 (600 Ca) MG TABS tablet Take 600 mg of elemental calcium by mouth 2 (two) times daily with a meal.    [provider]  carvedilol (COREG) 6.25 MG tablet TAKE ONE-HALF TABLET BY MOUTH  EVERY MORNING AND 1 TABLET EVERY EVENING 12/13/23   [name], MD  celecoxib (CELEBREX) 200 MG capsule TAKE 1 CAPSULE BY MOUTH DAILY  WITH FOOD 06/27/23   [name], MD  cloNIDine (CATAPRES) 0.2 MG tablet TAKE 1 TABLET BY MOUTH TWICE  DAILY FOR BLOOD PRESSURE 12/13/23   [name], MD  Colchicine 0.6 MG CAPS Take 1 capsule by mouth daily. Patient taking differently: Take 1 capsule by mouth as needed (gout flareups). 03/23/22   [name], MD  cyclobenzaprine (FLEXERIL) 10 MG tablet Take by mouth. 01/10/24   [provider]  Diclofenac Sodium 1 % CREA Apply 4 g topically in the morning, at noon, in the evening, and at bedtime.  01/18/24   [name], MD  DULoxetine (CYMBALTA) 30 MG capsule TAKE 1 CAPSULE BY MOUTH TWICE  DAILY 01/04/23   [name], MD  ferrous sulfate 325 (65 FE) MG tablet Take 325 mg by mouth in the morning.    [provider]  fluocinolone (SYNALAR) 0.01 % external solution Place 3-5 drops in each ear canal twice daily Patient taking differently: 3-5 drops 2 (two) times daily as needed (irritation/infection.). Place 3-5 drops in each ear canal twice daily 03/25/22   [name], MD  furosemide (LASIX) 40 MG tablet TAKE 1 TABLET BY MOUTH IN THE  MORNING 12/29/23   [name], MD  hydroxychloroquine (PLAQUENIL) 200 MG tablet TAKE 1  TABLET BY MOUTH IN THE  MORNING AND AT BEDTIME 10/04/23   [name], MD  hydrOXYzine (VISTARIL) 50 MG capsule Take 1 capsule (50 mg total) by mouth 3 (three) times daily as needed for nausea or vomiting (and vertigo). 09/12/23   [name], MD  Multiple Vitamin (MULTIVITAMIN WITH MINERALS) TABS tablet Take 1 tablet by mouth in the morning.    [provider]  oxyCODONE (OXY IR/ROXICODONE) 5 MG immediate release tablet Take 1 tablet (5 mg total) by mouth every 4 (four) hours as needed for moderate pain, severe pain or breakthrough pain. 08/14/23   Lewie Chamber, MD  pioglitazone (ACTOS) 30 MG tablet TAKE 1 TABLET BY MOUTH DAILY 12/08/23   [name], MD  potassium chloride SA (KLOR-CON M) 20 MEQ tablet TAKE 1 TABLET BY MOUTH DAILY FOR POTASSIUM REPLACEMENT/  SUPPLEMENT 04/05/23   [name], MD  predniSONE (DELTASONE) 20 MG tablet Take by mouth. 01/10/24   [provider]  pregabalin (LYRICA) 50 MG capsule 1 qhs X7 days , then 2 qhs X 7d, then 3 qhs X 7d, then 4 qhs 01/18/24   [name], MD  silver sulfADIAZINE (SILVADENE) 1 % cream Apply topically as needed. 11/03/22   [provider]  tamsulosin (FLOMAX) 0.4 MG CAPS capsule TAKE 1 CAPSULE BY MOUTH AT  BEDTIME 12/29/23   [name], MD  vitamin B-12 (CYANOCOBALAMIN) 500 MCG tablet Take 500 mcg by mouth in the morning.    [provider]      Allergies    Ciprofloxacin, Definity [perflutren lipid microsphere], and Levofloxacin [levofloxacin]    Review of Systems   Review of Systems  Constitutional:  Negative for chills and fever.  HENT:  Negative for congestion and sore throat.   Eyes: Negative.   Respiratory:  Negative for chest tightness and shortness of breath.   Cardiovascular:  Negative for chest pain.  Gastrointestinal:  Negative for abdominal pain, nausea and vomiting.  Genitourinary: Negative.   Musculoskeletal:  Positive for neck pain. Negative for arthralgias, joint swelling and  neck stiffness.  Skin:  Positive for wound. Negative for rash.  Neurological:  Negative for dizziness, weakness, light-headedness, numbness and headaches.  Psychiatric/Behavioral: Negative.    All other systems reviewed and are negative.   Physical Exam Updated Vital Signs BP (!) 149/76   Pulse (!) 58   Temp 98 F (36.7 C) (Oral)   Resp 16   Ht 6\' 1"  (1.854 m)   Wt (!) 154 kg   SpO2 100%   BMI 44.79 kg/m  Physical Exam Vitals and nursing note reviewed.  Constitutional:      Appearance: He is well-developed.  HENT:     Head: Normocephalic and atraumatic.  Eyes:     Conjunctiva/sclera: Conjunctivae normal.  Cardiovascular:  Rate and Rhythm: Normal rate and regular rhythm.     Heart sounds: Normal heart sounds.  Pulmonary:     Effort: Pulmonary effort is normal.     Breath sounds: Normal breath sounds. No wheezing.  Abdominal:     General: Bowel sounds are normal.     Palpations: Abdomen is soft.     Tenderness: There is no abdominal tenderness.  Musculoskeletal:        General: Normal range of motion.     Cervical back: No tenderness.  Lymphadenopathy:     Cervical: No cervical adenopathy.  Skin:    General: Skin is warm and dry.     Comments: Raised wound/ abrasion left groin with purulent weeping.  Approximately 10 cm area of induration left groin,  no red streaking.  No involvement of the testicle. No red streaking.   Neurological:     General: No focal deficit present.     Mental Status: He is alert and oriented to person, place, and time.     Cranial Nerves: No cranial nerve deficit or facial asymmetry.     Sensory: Sensation is intact.     Motor: Motor function is intact.     Coordination: Coordination is intact.     Comments: Equal grip strength.     ED Results / Procedures / Treatments   Labs (all labs ordered are listed, but only abnormal results are displayed) Labs Reviewed  BASIC METABOLIC PANEL - Abnormal; Notable for the following components:       Result Value   Glucose, Bld 146 (*)    BUN 36 (*)    Creatinine, Ser 1.28 (*)    All other components within normal limits  CBC WITH DIFFERENTIAL/PLATELET - Abnormal; Notable for the following components:   RBC 3.74 (*)    Hemoglobin 12.4 (*)    HCT 37.2 (*)    Platelets 143 (*)    All other components within normal limits  AEROBIC CULTURE W GRAM STAIN (SUPERFICIAL SPECIMEN)    EKG None  Radiology CT PELVIS W CONTRAST Result Date: 02/07/2024 CLINICAL DATA:  Left groin abscess EXAM: CT PELVIS WITH CONTRAST TECHNIQUE: Multidetector CT imaging of the pelvis was performed using the standard protocol following the bolus administration of intravenous contrast. RADIATION DOSE REDUCTION: This exam was performed according to the departmental dose-optimization program which includes automated exposure control, adjustment of the mA and/or kV according to patient size and/or use of iterative reconstruction technique. CONTRAST:  OMNIPAQUE IOHEXOL 300 MG/ML  SOLN COMPARISON:  CT 02/15/2017 FINDINGS: Urinary Tract:  Bladder is unremarkable. Bowel: No acute bowel wall thickening. Diverticular disease of the left colon. Vascular/Lymphatic: Moderate atherosclerosis. No aneurysm. Mildly enlarged left inguinal nodes measuring up to 14 mm. Reproductive:  Negative prostate Other: Negative for pelvic effusion or free air. Skin thickening and subcutaneous infiltration at the left groin. Some fluid in the subcutaneous soft tissues without well organized rim enhancing abscess. Cutaneous/subcutaneous fluid collection measuring 16 x 14 mm on series 7, image 20. Musculoskeletal: No acute osseous abnormality. IMPRESSION: 1. Skin thickening and subcutaneous infiltration and small fluid in the subcutaneous fat at the left groin consistent with cellulitis. Cutaneous/subcutaneous slightly more focal fluid collection measuring 16 x 14 mm could reflect inflammatory fluid or developing/small abscess. 2. Mildly enlarged  left inguinal nodes, likely reactive. 3. Diverticular disease of the left colon without acute inflammatory process. Electronically Signed   By: Jasmine Pang M.D.   On: 02/07/2024 16:12   CT Cervical Spine  Wo Contrast Result Date: 02/07/2024 CLINICAL DATA:  Neck pain radiating to left side EXAM: CT CERVICAL SPINE WITHOUT CONTRAST TECHNIQUE: Multidetector CT imaging of the cervical spine was performed without intravenous contrast. Multiplanar CT image reconstructions were also generated. RADIATION DOSE REDUCTION: This exam was performed according to the departmental dose-optimization program which includes automated exposure control, adjustment of the mA and/or kV according to patient size and/or use of iterative reconstruction technique. COMPARISON:  09/07/2018 FINDINGS: Alignment: Slight reversal of cervical lordosis is likely positional. Otherwise alignment is anatomic. Skull base and vertebrae: No acute fracture. No primary bone lesion or focal pathologic process. Soft tissues and spinal canal: No prevertebral fluid or swelling. No visible canal hematoma. Disc levels:  Findings at individual levels are as follows: C2-C3: Unremarkable. C3-C4: Left predominant uncovertebral hypertrophy and left-sided facet hypertrophy result in moderate left neural foraminal encroachment. C4-C5: Unremarkable. C5-C6: Unremarkable. C6-C7: Unremarkable. C7-T1: Unremarkable. Upper chest: Airway is patent.  Lung apices are clear. Other: Reconstructed images demonstrate no additional findings. IMPRESSION: 1. No acute cervical spine fracture. 2. Left predominant spondylosis and facet hypertrophy at C3-4, resulting in moderate left neural foraminal encroachment. This has progressed since the prior exam. Electronically Signed   By: Sharlet Salina M.D.   On: 02/07/2024 16:08    Procedures Procedures    INCISION AND DRAINAGE Performed by: Burgess Amor Consent: Verbal consent obtained. Risks and benefits: risks, benefits and  alternatives were discussed Type: abscess  Body area: left groin  Anesthesia: local infiltration  Incision was made with a scalpel.  Local anesthetic: lidocaine 1% without epinephrine  Anesthetic total: 5 ml  Complexity: complex Blunt dissection to break up loculations  Drainage: purulent  Drainage amount: small amount of purulence  Packing material: 1/4 in iodoform gauze  Patient tolerance: Patient tolerated the procedure well with no immediate complications.   {Document cardiac monitor, telemetry assessment procedure when appropriate:1}  Medications Ordered in ED Medications  iohexol (OMNIPAQUE) 300 MG/ML solution 100 mL (100 mLs Intravenous Contrast Given 02/07/24 1342)  ketorolac (TORADOL) 30 MG/ML injection 15 mg (15 mg Intravenous Given 02/07/24 1449)  Tdap (BOOSTRIX) injection 0.5 mL (0.5 mLs Intramuscular Given 02/07/24 1452)  doxycycline (VIBRA-TABS) tablet 100 mg (100 mg Oral Given 02/07/24 1733)    ED Course/ Medical Decision Making/ A&P Clinical Course as of 02/08/24 0002  Wed Feb 07, 2024  1710 CT Cervical Spine Wo Contrast [JI]    Clinical Course User Index [JI] Burgess Amor, PA-C   {   Click here for ABCD2, HEART and other calculatorsREFRESH Note before signing :1}                              Medical Decision Making Patient with an abscess in his left groin over the past 4 days, small amount of purulent drainage obtained with I&D.  CT imaging reviewed per below indicating mostly cellulitis  Amount and/or Complexity of Data Reviewed Radiology:  Decision-making details documented in ED Course.  Risk Prescription drug management.  With suggestion of a small fluid collection   {Document critical care time when appropriate:1} {Document review of labs and clinical decision tools ie heart score, Chads2Vasc2 etc:1}  {Document your independent review of radiology images, and any outside records:1} {Document your discussion with family members,  caretakers, and with consultants:1} {Document social determinants of health affecting pt's care:1} {Document your decision making why or why not admission, treatments were needed:1} Final Clinical Impression(s) / ED Diagnoses Final  diagnoses:  Abscess of groin, left  Cervical spondylosis    Rx / DC Orders ED Discharge Orders          Ordered    doxycycline (VIBRAMYCIN) 100 MG capsule  2 times daily        02/07/24 1712    HYDROcodone-acetaminophen (NORCO/VICODIN) 5-325 MG tablet  Every 6 hours PRN        02/07/24 1720

## 2024-02-07 NOTE — Patient Instructions (Signed)
Medication Instructions:  Your physician recommends that you continue on your current medications as directed. Please refer to the Current Medication list given to you today.   Labwork: None today  Testing/Procedures: None today  Follow-Up: 6 months  Any Other Special Instructions Will Be Listed Below (If Applicable).  If you need a refill on your cardiac medications before your next appointment, please call your pharmacy.

## 2024-02-07 NOTE — ED Provider Notes (Signed)
EMERGENCY DEPARTMENT AT [facility] Provider Note   CSN: [id number] Arrival date & time: 02/07/24  1914     History {Add pertinent medical, surgical, social history, OB history to HPI:1} Chief Complaint  Patient presents with   Wound Check    Mitchell Parker. is a 73 y.o. male with several complaints,  then main being an abscess that has developed in his left groin since sustaining an abrasion with a stick when he was rough housing with friends at a reunion 4 days ago.  Describes a simple abrasion which has since developing a worsening wound with swelling, redness and purulent drainage.  He has applied mercurochrome and has changed the dressing daily. Denies fevers or chills, no n/v, states his cbg's have been under good control.  Not sure of tetanus status.   Also,  reports chronic pain in his neck,  now with radiation of pain to his elbow area, described as an aching pain,  constant but worse x 1 month.  [facility] [place] regarding the neck issue, stating he expects he will need neurosurgical referral.     The history is provided by the patient.       Home Medications Prior to Admission medications   Medication Sig Start Date End Date Taking? Authorizing Provider  acetaminophen (TYLENOL) 500 MG tablet Take 500 mg by mouth every 6 (six) hours as needed for mild pain.    [provider]  allopurinol (ZYLOPRIM) 300 MG tablet TAKE 1 TABLET BY MOUTH EVERY  MORNING 12/29/23   [name], MD  amLODipine-olmesartan (AZOR) 10-40 MG tablet Take 1 tablet by mouth daily. 04/05/23   [name], MD  atorvastatin (LIPITOR) 80 MG tablet TAKE 1 TABLET BY MOUTH DAILY AT  6 PM 12/29/23   [name], MD  calcitRIOL (ROCALTROL) 0.25 MCG capsule TAKE 1 CAPSULE BY MOUTH DAILY  WITH BREAKFAST 12/29/23   [name], MD  calcium carbonate (OSCAL) 1500 (600 Ca) MG TABS tablet Take 600 mg of elemental calcium by mouth 2 (two) times  daily with a meal.    [provider]  carvedilol (COREG) 6.25 MG tablet TAKE ONE-HALF TABLET BY MOUTH  EVERY MORNING AND 1 TABLET EVERY EVENING 12/13/23   [name], MD  celecoxib (CELEBREX) 200 MG capsule TAKE 1 CAPSULE BY MOUTH DAILY  WITH FOOD 06/27/23   [name], MD  cloNIDine (CATAPRES) 0.2 MG tablet TAKE 1 TABLET BY MOUTH TWICE  DAILY FOR BLOOD PRESSURE 12/13/23   [name], MD  Colchicine 0.6 MG CAPS Take 1 capsule by mouth daily. Patient taking differently: Take 1 capsule by mouth as needed (gout flareups). 03/23/22   [name], MD  cyclobenzaprine (FLEXERIL) 10 MG tablet Take by mouth. 01/10/24   [provider]  Diclofenac Sodium 1 % CREA Apply 4 g topically in the morning, at noon, in the evening, and at bedtime. 01/18/24   [name], MD  DULoxetine (CYMBALTA) 30 MG capsule TAKE 1 CAPSULE BY MOUTH TWICE  DAILY 01/04/23   [name], MD  ferrous sulfate 325 (65 FE) MG tablet Take 325 mg by mouth in the morning.    [provider]  fluocinolone (SYNALAR) 0.01 % external solution Place 3-5 drops in each ear canal twice daily Patient taking differently: 3-5 drops 2 (two) times daily as needed (irritation/infection.). Place 3-5 drops in each ear canal twice daily 03/25/22   [name], MD  furosemide (LASIX) 40 MG tablet TAKE 1 TABLET BY MOUTH IN THE  MORNING 12/29/23   [name], MD  HYDROcodone-acetaminophen [facility]) 5-325 MG tablet Take 1 tablet by mouth 2 (two) times daily after a meal. Take for moderate to severe pain 01/18/24   [name], MD  hydroxychloroquine (PLAQUENIL) 200 MG tablet TAKE 1 TABLET BY MOUTH IN THE  MORNING AND AT BEDTIME 10/04/23   [name], MD  hydrOXYzine (VISTARIL) 50 MG capsule Take 1 capsule (50 mg total) by mouth 3 (three) times daily as needed for nausea or vomiting (and vertigo). 09/12/23   [name], MD  Multiple Vitamin (MULTIVITAMIN WITH MINERALS) TABS tablet Take 1 tablet by mouth in the  morning.    [provider]  oxyCODONE (OXY IR/ROXICODONE) 5 MG immediate release tablet Take 1 tablet (5 mg total) by mouth every 4 (four) hours as needed for moderate pain, severe pain or breakthrough pain. 08/14/23   Lewie Chamber, MD  pioglitazone (ACTOS) 30 MG tablet TAKE 1 TABLET BY MOUTH DAILY 12/08/23   [name], MD  potassium chloride SA (KLOR-CON M) 20 MEQ tablet TAKE 1 TABLET BY MOUTH DAILY FOR POTASSIUM REPLACEMENT/  SUPPLEMENT 04/05/23   [name], MD  predniSONE (DELTASONE) 20 MG tablet Take by mouth. 01/10/24   [provider]  pregabalin (LYRICA) 50 MG capsule 1 qhs X7 days , then 2 qhs X 7d, then 3 qhs X 7d, then 4 qhs 01/18/24   [name], MD  silver sulfADIAZINE (SILVADENE) 1 % cream Apply topically as needed. 11/03/22   [provider]  tamsulosin (FLOMAX) 0.4 MG CAPS capsule TAKE 1 CAPSULE BY MOUTH AT  BEDTIME 12/29/23   [name], MD  vitamin B-12 (CYANOCOBALAMIN) 500 MCG tablet Take 500 mcg by mouth in the morning.    [provider]      Allergies    Ciprofloxacin, Definity [perflutren lipid microsphere], and Levofloxacin [levofloxacin]    Review of Systems   Review of Systems  Constitutional:  Negative for chills and fever.  HENT:  Negative for congestion and sore throat.   Eyes: Negative.   Respiratory:  Negative for chest tightness and shortness of breath.   Cardiovascular:  Negative for chest pain.  Gastrointestinal:  Negative for abdominal pain, nausea and vomiting.  Genitourinary: Negative.   Musculoskeletal:  Positive for neck pain. Negative for arthralgias, joint swelling and neck stiffness.  Skin:  Positive for wound. Negative for rash.  Neurological:  Negative for dizziness, weakness, light-headedness, numbness and headaches.  Psychiatric/Behavioral: Negative.    All other systems reviewed and are negative.   Physical Exam Updated Vital Signs BP (!) 152/69 (BP Location: Right Arm)   Pulse (!) 53    Temp 97.6 F (36.4 C) (Oral)   Resp 18   Ht 6\' 1"  (1.854 m)   Wt (!) 154 kg   SpO2 100%   BMI 44.79 kg/m  Physical Exam Vitals and nursing note reviewed.  Constitutional:      Appearance: He is well-developed.  HENT:     Head: Normocephalic and atraumatic.  Eyes:     Conjunctiva/sclera: Conjunctivae normal.  Cardiovascular:     Rate and Rhythm: Normal rate and regular rhythm.     Heart sounds: Normal heart sounds.  Pulmonary:     Effort: Pulmonary effort is normal.     Breath sounds: Normal breath sounds. No wheezing.  Abdominal:     General: Bowel sounds are normal.     Palpations: Abdomen is  soft.     Tenderness: There is no abdominal tenderness.  Musculoskeletal:        General: Normal range of motion.     Cervical back: No tenderness.  Lymphadenopathy:     Cervical: No cervical adenopathy.  Skin:    General: Skin is warm and dry.     Comments: Raised wound/ abrasion left groin with purulent weeping.  Approximately 10 cm left groin,  no red streaking.  Tender induration left inguinal nodes.  Neurological:     General: No focal deficit present.     Mental Status: He is alert and oriented to person, place, and time.     Cranial Nerves: No cranial nerve deficit or facial asymmetry.     Sensory: Sensation is intact.     Motor: Motor function is intact.     Coordination: Coordination is intact.     Comments: Equal grip strength.     ED Results / Procedures / Treatments   Labs (all labs ordered are listed, but only abnormal results are displayed) Labs Reviewed  BASIC METABOLIC PANEL - Abnormal; Notable for the following components:      Result Value   Glucose, Bld 146 (*)    BUN 36 (*)    Creatinine, Ser 1.28 (*)    All other components within normal limits  CBC WITH DIFFERENTIAL/PLATELET - Abnormal; Notable for the following components:   RBC 3.74 (*)    Hemoglobin 12.4 (*)    HCT 37.2 (*)    Platelets 143 (*)    All other components within normal limits     EKG None  Radiology No results found.  Procedures Procedures  {Document cardiac monitor, telemetry assessment procedure when appropriate:1}  Medications Ordered in ED Medications  iohexol (OMNIPAQUE) 300 MG/ML solution 100 mL (100 mLs Intravenous Contrast Given 02/07/24 1342)    ED Course/ Medical Decision Making/ A&P   {   Click here for ABCD2, HEART and other calculatorsREFRESH Note before signing :1}                              Medical Decision Making    {Document critical care time when appropriate:1} {Document review of labs and clinical decision tools ie heart score, Chads2Vasc2 etc:1}  {Document your independent review of radiology images, and any outside records:1} {Document your discussion with family members, caretakers, and with consultants:1} {Document social determinants of health affecting pt's care:1} {Document your decision making why or why not admission, treatments were needed:1} Final Clinical Impression(s) / ED Diagnoses Final diagnoses:  None    Rx / DC Orders ED Discharge Orders     None

## 2024-02-07 NOTE — Progress Notes (Signed)
Cardiology Office Note  Date: 02/07/2024   ID: Mitchell Neisler., DOB 09/04/1951, MRN [id number]  History of Present Illness: Mitchell Foutz. is a 73 y.o. male last seen in August 2024.  He is here for a routine visit.  Reports no palpitations or exertional chest pain, stable NYHA class II dyspnea.  He has been having some recurrent left arm discomfort that sounds neuropathic in description, he plans to discuss this with his PCP through the [facility] system tomorrow at visit.  I reviewed his medications.  Current regimen includes Azor, Lipitor, Coreg, clonidine, Lasix, and potassium supplement.  Blood pressure control is reasonable today.  I went over his interval lab work, last LDL was 40 in July 2024.  Recent hemoglobin A1c 5.4%.  He has not been able to lose weight consistently, had been on Mounjaro previously however had to stop it due to increased cost.  I reviewed his ECG today which shows sinus bradycardia with prolonged PR interval and IVCD, heart rate 51 bpm.  Physical Exam: VS:  BP (!) 128/58 (BP Location: Right Arm, Patient Position: Sitting, Cuff Size: Large)   Pulse (!) 52   Ht 6\' 1"  (1.854 m)   Wt (!) 339 lb (153.8 kg)   SpO2 97%   BMI 44.73 kg/m , BMI Body mass index is 44.73 kg/m.  Wt Readings from Last 3 Encounters:  02/07/24 (!) 339 lb (153.8 kg)  01/18/24 (!) 332 lb (150.6 kg)  10/19/23 (!) 308 lb (139.7 kg)    General: Patient appears comfortable at rest. HEENT: Conjunctiva and lids normal. Lungs: Clear to auscultation, nonlabored breathing at rest. Cardiac: Regular rate and rhythm, no S3 or significant systolic murmur, no pericardial rub. Extremities: No pitting edema.  ECG:  An ECG dated 02/02/2023 was personally reviewed today and demonstrated:  Sinus bradycardia with prolonged PR interval, IVCD.  Labwork: 01/18/2024: ALT 81; AST 66; BUN 35; Creatinine, Ser 1.28; Hemoglobin 14.3; Platelets 123; Potassium 4.6; Sodium 141     Component Value Date/Time    CHOL 95 (L) 07/05/2023 1007   TRIG 131 07/05/2023 1007   HDL 32 (L) 07/05/2023 1007   CHOLHDL 3.0 07/05/2023 1007   CHOLHDL 11.7 10/22/2011 1030   VLDL 39 10/22/2011 1030   LDLCALC 40 07/05/2023 1007   Other Studies Reviewed Today:  No interval cardiac testing for review today.  Assessment and Plan:  1.  Paroxysmal atrial flutter with CHA2DS2-VASc score of 5.  He is status post Watchman implantation in February 2024 (prior history of traumatic subdural hematomas and subarachnoid hemorrhage while not anticoagulated).  Continues to do well with no recurrent palpitations and in sinus rhythm by ECG today.  No longer on anticoagulant or antiplatelet therapy.   2.  HFrecEF with history of nonischemic cardiomyopathy,LVEF 55 to 60% by TEE in February 2024.  Continue observation at this point.   3.  Primary hypertension.  He is on multimodal therapy including Azor, Coreg, and clonidine.  Also uses Lasix with potassium supplement.  Blood pressure is adequately controlled today.   4.  Coronary calcium score 4417 with multivessel coronary atherosclerosis by cardiac CT in January 2024.  Previous cardiac catheterization in 2003 showed minor coronary atherosclerosis.  No angina or change in ECG.  Continue Lipitor, last LDL 40.  5.  Type 2 diabetes mellitus, recent hemoglobin A1c 5.4%.  He follows with PCP and is on Actos at this time.  Likely good candidate for either Ozempic or Wegovy.  Disposition:  Follow up  6 months.  Signed, Jonelle Sidle, M.D., F.A.C.C. [facility]
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
## 2024-02-07 NOTE — Discharge Instructions (Addendum)
Keep your appointment with your primary provider at the [facility] tomorrow.  Take the entire course of the antibiotics prescribed.  I also recommend a warm shower soak at this abscess site 5 minutes with gentle water pressure twice daily.  The packing can be removed in 4 days if you feel comfortable doing this, if not return here or see your primary doctor to have this removed.  Get rechecked for any worsening symptoms.  Take your CT results with you to your visit tomorrow as we were unable to share it through the electronic charting system.
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
## 2024-02-10 LAB — AEROBIC CULTURE W GRAM STAIN (SUPERFICIAL SPECIMEN): Special Requests: NORMAL

## 2024-02-11 ENCOUNTER — Telehealth (HOSPITAL_BASED_OUTPATIENT_CLINIC_OR_DEPARTMENT_OTHER): Payer: Self-pay | Admitting: *Deleted

## 2024-02-11 NOTE — Telephone Encounter (Signed)
Post ED Visit - Positive Culture Follow-up  Culture report reviewed by antimicrobial stewardship pharmacist: [facility] []  Nathan Batchelder, Pharm.D. []  Celedonio Miyamoto, Pharm.D., BCPS AQ-ID []  Garvin Fila, Pharm.D., BCPS []  Georgina Pillion, Pharm.D., BCPS []  [place], [place].D., BCPS, AAHIVP []  Estella Husk, Pharm.D., BCPS, AAHIVP []  Lysle Pearl, PharmD, BCPS []  Phillips Climes, PharmD, BCPS []  Agapito Games, PharmD, BCPS []  Verlan Friends, PharmD []  Mervyn Gay, PharmD, BCPS [x]  Delmar Landau, PharmD  [facility] []  Len Childs, PharmD []  Greer Pickerel, PharmD []  Adalberto Cole, PharmD []  Perlie Gold, Rph []  Lonell Face) Jean Rosenthal, PharmD []  Earl Many, PharmD []  Junita Push, PharmD []  Dorna Leitz, PharmD []  Terrilee Files, PharmD []  Lynann Beaver, PharmD []  Keturah Barre, PharmD []  Loralee Pacas, PharmD []  Bernadene Person, PharmD   Positive aerobic culture Treated with Doxycycline, organism sensitive to the same and no further patient follow-up is required at this time.  Patsey Berthold 02/11/2024, 12:01 PM

## 2024-02-13 ENCOUNTER — Ambulatory Visit: Payer: Medicare Other | Admitting: Family Medicine

## 2024-02-13 ENCOUNTER — Other Ambulatory Visit: Payer: Self-pay | Admitting: Family Medicine

## 2024-02-13 NOTE — Telephone Encounter (Signed)
Pt was scheduled for 02/13/24, rescheduled for 02/22/24 Please advise

## 2024-02-13 NOTE — Telephone Encounter (Signed)
  Prescription Request  02/13/2024  Is this a "Controlled Substance" medicine? Yes  Have you seen your PCP in the last 2 weeks? Pt had appt today with [facility] and [facility] is out. Pt rescheduled for next Thurs  If YES, route message to pool  -  If NO, patient needs to be scheduled for appointment.  What is the name of the medication or equipment? Hydrodocone/ pt is out  Have you contacted your pharmacy to request a refill? no   Which pharmacy would you like this sent to? [facility]   Patient notified that their request is being sent to the clinical staff for review and that they should receive a response within 2 business days.

## 2024-02-14 ENCOUNTER — Other Ambulatory Visit: Payer: Self-pay | Admitting: Family Medicine

## 2024-02-14 MED ORDER — HYDROCODONE-ACETAMINOPHEN 5-325 MG PO TABS: 1.0000 | ORAL_TABLET | Freq: Four times a day (QID) | ORAL | 0 refills | Status: DC | PRN

## 2024-02-15 ENCOUNTER — Other Ambulatory Visit: Payer: Self-pay

## 2024-02-15 DIAGNOSIS — E611 Iron deficiency: Secondary | ICD-10-CM

## 2024-02-15 DIAGNOSIS — D696 Thrombocytopenia, unspecified: Secondary | ICD-10-CM

## 2024-02-15 DIAGNOSIS — R161 Splenomegaly, not elsewhere classified: Secondary | ICD-10-CM

## 2024-02-15 NOTE — Telephone Encounter (Signed)
 Pt aware refill sent to pharmacy

## 2024-02-16 ENCOUNTER — Inpatient Hospital Stay: Payer: No Typology Code available for payment source | Attending: Hematology

## 2024-02-16 ENCOUNTER — Inpatient Hospital Stay: Payer: No Typology Code available for payment source

## 2024-02-16 DIAGNOSIS — M47812 Spondylosis without myelopathy or radiculopathy, cervical region: Secondary | ICD-10-CM | POA: Diagnosis not present

## 2024-02-16 DIAGNOSIS — Z09 Encounter for follow-up examination after completed treatment for conditions other than malignant neoplasm: Secondary | ICD-10-CM | POA: Insufficient documentation

## 2024-02-16 DIAGNOSIS — Z86711 Personal history of pulmonary embolism: Secondary | ICD-10-CM | POA: Insufficient documentation

## 2024-02-16 DIAGNOSIS — D696 Thrombocytopenia, unspecified: Secondary | ICD-10-CM | POA: Insufficient documentation

## 2024-02-16 DIAGNOSIS — I130 Hypertensive heart and chronic kidney disease with heart failure and stage 1 through stage 4 chronic kidney disease, or unspecified chronic kidney disease: Secondary | ICD-10-CM | POA: Insufficient documentation

## 2024-02-16 DIAGNOSIS — E611 Iron deficiency: Secondary | ICD-10-CM | POA: Diagnosis not present

## 2024-02-16 DIAGNOSIS — N183 Chronic kidney disease, stage 3 unspecified: Secondary | ICD-10-CM | POA: Diagnosis not present

## 2024-02-16 DIAGNOSIS — M79602 Pain in left arm: Secondary | ICD-10-CM | POA: Insufficient documentation

## 2024-02-16 DIAGNOSIS — J449 Chronic obstructive pulmonary disease, unspecified: Secondary | ICD-10-CM | POA: Insufficient documentation

## 2024-02-16 DIAGNOSIS — Z86718 Personal history of other venous thrombosis and embolism: Secondary | ICD-10-CM | POA: Diagnosis not present

## 2024-02-16 DIAGNOSIS — R161 Splenomegaly, not elsewhere classified: Secondary | ICD-10-CM | POA: Insufficient documentation

## 2024-02-16 DIAGNOSIS — E1122 Type 2 diabetes mellitus with diabetic chronic kidney disease: Secondary | ICD-10-CM | POA: Insufficient documentation

## 2024-02-16 DIAGNOSIS — I509 Heart failure, unspecified: Secondary | ICD-10-CM | POA: Diagnosis not present

## 2024-02-16 DIAGNOSIS — I4891 Unspecified atrial fibrillation: Secondary | ICD-10-CM | POA: Diagnosis not present

## 2024-02-16 DIAGNOSIS — M069 Rheumatoid arthritis, unspecified: Secondary | ICD-10-CM | POA: Diagnosis not present

## 2024-02-16 LAB — CBC WITH DIFFERENTIAL/PLATELET
Abs Immature Granulocytes: 0.02 10*3/uL (ref 0.00–0.07)
Basophils Absolute: 0 10*3/uL (ref 0.0–0.1)
Basophils Relative: 1 %
Eosinophils Absolute: 0.3 10*3/uL (ref 0.0–0.5)
Eosinophils Relative: 4 %
HCT: 39.8 % (ref 39.0–52.0)
Hemoglobin: 13.5 g/dL (ref 13.0–17.0)
Immature Granulocytes: 0 %
Lymphocytes Relative: 13 %
Lymphs Abs: 1 10*3/uL (ref 0.7–4.0)
MCH: 33.6 pg (ref 26.0–34.0)
MCHC: 33.9 g/dL (ref 30.0–36.0)
MCV: 99 fL (ref 80.0–100.0)
Monocytes Absolute: 0.7 10*3/uL (ref 0.1–1.0)
Monocytes Relative: 10 %
Neutro Abs: 5.2 10*3/uL (ref 1.7–7.7)
Neutrophils Relative %: 72 %
Platelets: 159 10*3/uL (ref 150–400)
RBC: 4.02 MIL/uL — ABNORMAL LOW (ref 4.22–5.81)
RDW: 13.4 % (ref 11.5–15.5)
WBC: 7.3 10*3/uL (ref 4.0–10.5)
nRBC: 0 % (ref 0.0–0.2)

## 2024-02-16 LAB — COMPREHENSIVE METABOLIC PANEL
ALT: 56 U/L — ABNORMAL HIGH (ref 0–44)
AST: 47 U/L — ABNORMAL HIGH (ref 15–41)
Albumin: 3.7 g/dL (ref 3.5–5.0)
Alkaline Phosphatase: 63 U/L (ref 38–126)
Anion gap: 10 (ref 5–15)
BUN: 40 mg/dL — ABNORMAL HIGH (ref 8–23)
CO2: 26 mmol/L (ref 22–32)
Calcium: 8.8 mg/dL — ABNORMAL LOW (ref 8.9–10.3)
Chloride: 101 mmol/L (ref 98–111)
Creatinine, Ser: 1.15 mg/dL (ref 0.61–1.24)
GFR, Estimated: >60 mL/min (ref >60)
Glucose, Bld: 136 mg/dL — ABNORMAL HIGH (ref 70–99)
Potassium: 4.6 mmol/L (ref 3.5–5.1)
Sodium: 137 mmol/L (ref 135–145)
Total Bilirubin: 0.6 mg/dL (ref 0.0–1.2)
Total Protein: 7 g/dL (ref 6.5–8.1)

## 2024-02-16 LAB — IRON AND TIBC
Iron: 75 ug/dL (ref 45–182)
Saturation Ratios: 24 % (ref 17.9–39.5)
TIBC: 315 ug/dL (ref 250–450)
UIBC: 240 ug/dL

## 2024-02-16 LAB — FERRITIN: Ferritin: 135 ng/mL (ref 24–336)

## 2024-02-16 LAB — LACTATE DEHYDROGENASE: LDH: 176 U/L (ref 98–192)

## 2024-02-19 ENCOUNTER — Other Ambulatory Visit: Payer: Self-pay | Admitting: Family Medicine

## 2024-02-19 DIAGNOSIS — N1832 Chronic kidney disease, stage 3b: Secondary | ICD-10-CM

## 2024-02-19 DIAGNOSIS — N400 Enlarged prostate without lower urinary tract symptoms: Secondary | ICD-10-CM

## 2024-02-19 DIAGNOSIS — I10 Essential (primary) hypertension: Secondary | ICD-10-CM

## 2024-02-19 DIAGNOSIS — I509 Heart failure, unspecified: Secondary | ICD-10-CM

## 2024-02-22 ENCOUNTER — Encounter: Payer: Self-pay | Admitting: Family Medicine

## 2024-02-22 ENCOUNTER — Ambulatory Visit (INDEPENDENT_AMBULATORY_CARE_PROVIDER_SITE_OTHER): Payer: Medicare Other | Admitting: Family Medicine

## 2024-02-22 DIAGNOSIS — E782 Mixed hyperlipidemia: Secondary | ICD-10-CM

## 2024-02-22 MED ORDER — ATORVASTATIN CALCIUM 80 MG PO TABS: 80.0000 mg | ORAL_TABLET | Freq: Every day | ORAL | 0 refills | Status: DC

## 2024-02-22 MED ORDER — PREGABALIN 300 MG PO CAPS
300.0000 mg | ORAL_CAPSULE | Freq: Every day | ORAL | 1 refills | Status: DC
Start: 2024-02-22 — End: 2024-04-19

## 2024-02-22 MED ORDER — HYDROCODONE-ACETAMINOPHEN 5-325 MG PO TABS: 1.0000 | ORAL_TABLET | Freq: Four times a day (QID) | ORAL | 0 refills | Status: DC | PRN

## 2024-02-22 MED ORDER — CALCITRIOL 0.25 MCG PO CAPS: 0.2500 ug | ORAL_CAPSULE | Freq: Every day | ORAL | 0 refills | Status: DC

## 2024-02-22 NOTE — Progress Notes (Signed)
 Subjective:  Patient ID: Mitchell Jarred., male    DOB: 03-15-1951  Age: 73 y.o. MRN: [id number]  CC: Shoulder Pain (3rd and 4th vertebrae /Neuro surgeon on Monday/Numbness and tingling and pain. )   HPI Mitchell DEBONO Sr. presents for severe pain in the left shoulder.  He has been evaluated is referred to neurosurgery due to a nerve impingement in the cervical spine he apparently is going to have to have this decompressed.  His appointment is coming up on March 3 with neurosurgery.  In the meantime his pain is severe and he needs something to help.  He continues to take Lyrica without adequate relief.     01/18/2024    8:12 AM 10/19/2023    4:01 PM 10/05/2023    9:44 AM  Depression screen PHQ 2/9  Decreased Interest 0 0 0  Down, Depressed, Hopeless 0 0 0  PHQ - 2 Score 0 0 0  Altered sleeping 0 0   Tired, decreased energy 0 0   Change in appetite 0 0   Feeling bad or failure about yourself  0 0   Trouble concentrating 0 0   Moving slowly or fidgety/restless 0 0   Suicidal thoughts 0 0   PHQ-9 Score 0 0   Difficult doing work/chores Not difficult at all Not difficult at all     History Mitchell Parker has a past medical history of Chronic diarrhea, CKD (chronic kidney disease) stage 3, GFR 30-59 ml/min (HCC), COPD (chronic obstructive pulmonary disease) (HCC), Dysrhythmia (02/2022), Essential hypertension, Fibromyalgia, Gallstones, Gout, History of CHF (congestive heart failure), History of colonic polyps, History of DVT (deep vein thrombosis) (04/19/2012), Iron deficiency (02/16/2023), Kidney stones, Lumbar disc disease, Neuropathy, Nonischemic cardiomyopathy (HCC) (2003), Obesity, Orthostatic hypotension, Presence of Watchman left atrial appendage closure device (02/02/2023), RA (rheumatoid arthritis) (HCC), Sleep apnea, Subarachnoid hemorrhage (HCC) (02/03/2022), Subdural hematoma (HCC) (02/03/2022), Syncope, vasovagal, and Type 2 diabetes mellitus (HCC).   He has a past surgical history  that includes Umbilical hernia repair; Pilonidal cyst excision (1997); Varicose vein surgery; Cholecystectomy (01/18/2012); Bariatric Surgery (01/11/2018); Eye surgery (Right); [facility] (Right, 05/04/2022); Toe amputation (Right); LEFT ATRIAL APPENDAGE OCCLUSION (N/A, 02/02/2023); TEE without cardioversion (N/A, 02/02/2023); Colonoscopy (05/2017); watchman implant; and I & D extremity (Right, 08/12/2023).   His family history includes Diabetes in his brother and father; Heart disease in his father; Hypertension in his father and mother; Stroke in his mother.He reports that he quit smoking about 29 years ago. His smoking use included cigarettes. He started smoking about 54 years ago. He has a 50 pack-year smoking history. He has never used smokeless tobacco. He reports that he does not drink alcohol and does not use drugs.    ROS Review of Systems  Constitutional:  Negative for fever.  Respiratory:  Negative for shortness of breath.   Cardiovascular:  Negative for chest pain.  Musculoskeletal:  Positive for arthralgias (left shoulder) and neck pain.  Skin:  Negative for rash.    Objective:  BP (!) 141/66   Pulse (!) 58   Temp 97.9 F (36.6 C)   Ht 6\' 1"  (1.854 m)   SpO2 98%   BMI 44.79 kg/m   BP Readings from Last 3 Encounters:  02/23/24 131/71  02/22/24 (!) 141/66  02/07/24 (!) 149/76    Wt Readings from Last 3 Encounters:  02/23/24 (!) 339 lb 4.6 oz (153.9 kg)  02/07/24 (!) 339 lb 8.1 oz (154 kg)  02/07/24 (!) 339 lb (153.8 kg)  Physical Exam Vitals reviewed.  Constitutional:      Appearance: He is well-developed.  HENT:     Head: Normocephalic and atraumatic.     Right Ear: External ear normal.     Left Ear: External ear normal.     Mouth/Throat:     Pharynx: No oropharyngeal exudate or posterior oropharyngeal erythema.  Eyes:     Pupils: Pupils are equal, round, and reactive to light.  Cardiovascular:     Rate and Rhythm: Normal rate and regular rhythm.      Heart sounds: No murmur heard. Pulmonary:     Effort: No respiratory distress.     Breath sounds: Normal breath sounds.  Musculoskeletal:        General: Tenderness (at left shoulder and posterior neck) present.     Cervical back: Normal range of motion and neck supple.  Neurological:     Mental Status: He is alert and oriented to person, place, and time.       Assessment & Plan:   Mitchell "Annette Stable" was seen today for shoulder pain.  Diagnoses and all orders for this visit:  Mixed hyperlipidemia -     atorvastatin (LIPITOR) 80 MG tablet; Take 1 tablet (80 mg total) by mouth daily.  Other orders -     calcitRIOL (ROCALTROL) 0.25 MCG capsule; Take 1 capsule (0.25 mcg total) by mouth daily with breakfast. -     pregabalin (LYRICA) 300 MG capsule; Take 1 capsule (300 mg total) by mouth at bedtime. For neck -     HYDROcodone-acetaminophen (NORCO/VICODIN) 5-325 MG tablet; Take 1 tablet by mouth every 6 (six) hours as needed for severe pain (pain score 7-10).       I have changed Mitchell Rod Sr. "Bill"'s atorvastatin, calcitRIOL, and pregabalin. I am also having him maintain his calcium carbonate, vitamin B-12, acetaminophen, ferrous sulfate, Colchicine, fluocinolone, DULoxetine, multivitamin with minerals, silver sulfADIAZINE, celecoxib, hydroxychloroquine, furosemide, allopurinol, doxycycline, tamsulosin, carvedilol, potassium chloride SA, pioglitazone, cloNIDine, amLODipine-olmesartan, and HYDROcodone-acetaminophen.  Allergies as of 02/22/2024       Reactions   Ciprofloxacin Hives   Definity [perflutren Lipid Microsphere] Nausea And Vomiting   During ECHO with Definity injection pt became nauseated and vomited    Levofloxacin [levofloxacin] Hives        Medication List        Accurate as of February 22, 2024 11:59 PM. If you have any questions, ask your nurse or doctor.          acetaminophen 500 MG tablet Commonly known as: TYLENOL Take 500 mg by mouth every 6  (six) hours as needed for mild pain.   allopurinol 300 MG tablet Commonly known as: ZYLOPRIM TAKE 1 TABLET BY MOUTH EVERY  MORNING   amLODipine-olmesartan 10-40 MG tablet Commonly known as: AZOR TAKE 1 TABLET BY MOUTH DAILY   atorvastatin 80 MG tablet Commonly known as: LIPITOR Take 1 tablet (80 mg total) by mouth daily. What changed: when to take this Changed by: Mitchell Parker   calcitRIOL 0.25 MCG capsule Commonly known as: ROCALTROL Take 1 capsule (0.25 mcg total) by mouth daily with breakfast.   calcium carbonate 1500 (600 Ca) MG Tabs tablet Commonly known as: OSCAL Take 600 mg of elemental calcium by mouth 2 (two) times daily with a meal.   carvedilol 6.25 MG tablet Commonly known as: COREG TAKE ONE-HALF TABLET BY MOUTH  EVERY MORNING AND 1 TABLET BY  MOUTH EVERY EVENING   celecoxib 200 MG capsule Commonly known as:  CELEBREX TAKE 1 CAPSULE BY MOUTH DAILY  WITH FOOD   cloNIDine 0.2 MG tablet Commonly known as: CATAPRES TAKE 1 TABLET BY MOUTH TWICE  DAILY FOR BLOOD PRESSURE   Colchicine 0.6 MG Caps Take 1 capsule by mouth daily. What changed:  when to take this reasons to take this   doxycycline 100 MG capsule Commonly known as: VIBRAMYCIN Take 1 capsule (100 mg total) by mouth 2 (two) times daily.   DULoxetine 30 MG capsule Commonly known as: CYMBALTA TAKE 1 CAPSULE BY MOUTH TWICE  DAILY   ferrous sulfate 325 (65 FE) MG tablet Take 325 mg by mouth in the morning.   fluocinolone 0.01 % external solution Commonly known as: Synalar Place 3-5 drops in each ear canal twice daily What changed:  how much to take when to take this reasons to take this   furosemide 40 MG tablet Commonly known as: LASIX TAKE 1 TABLET BY MOUTH IN THE  MORNING   HYDROcodone-acetaminophen 5-325 MG tablet Commonly known as: NORCO/VICODIN Take 1 tablet by mouth every 6 (six) hours as needed for severe pain (pain score 7-10).   hydroxychloroquine 200 MG tablet Commonly known  as: PLAQUENIL TAKE 1 TABLET BY MOUTH IN THE  MORNING AND AT BEDTIME   multivitamin with minerals Tabs tablet Take 1 tablet by mouth in the morning.   pioglitazone 30 MG tablet Commonly known as: ACTOS TAKE 1 TABLET BY MOUTH DAILY   potassium chloride SA 20 MEQ tablet Commonly known as: KLOR-CON M TAKE 1 TABLET BY MOUTH DAILY FOR POTASSIUM REPLACEMENT/  SUPPLEMENT   predniSONE 20 MG tablet Commonly known as: DELTASONE Take by mouth.   pregabalin 300 MG capsule Commonly known as: Lyrica Take 1 capsule (300 mg total) by mouth at bedtime. For neck What changed:  medication strength how much to take how to take this when to take this additional instructions Changed by: Amarylis Rovito   silver sulfADIAZINE 1 % cream Commonly known as: SILVADENE Apply topically as needed.   tamsulosin 0.4 MG Caps capsule Commonly known as: FLOMAX TAKE 1 CAPSULE BY MOUTH AT  BEDTIME   vitamin B-12 500 MCG tablet Commonly known as: CYANOCOBALAMIN Take 500 mcg by mouth in the morning.         Follow-up: Return in about 2 months (around 04/21/2024) for diabetes.  [name], M.D.

## 2024-02-23 ENCOUNTER — Inpatient Hospital Stay: Payer: No Typology Code available for payment source | Admitting: Oncology

## 2024-02-23 ENCOUNTER — Other Ambulatory Visit: Payer: Medicare Other | Admitting: Pharmacist

## 2024-02-23 VITALS — BP 131/71 | HR 43 | Temp 97.0°F | Resp 16 | Wt 339.3 lb

## 2024-02-23 DIAGNOSIS — E119 Type 2 diabetes mellitus without complications: Secondary | ICD-10-CM

## 2024-02-23 DIAGNOSIS — Z86718 Personal history of other venous thrombosis and embolism: Secondary | ICD-10-CM | POA: Diagnosis not present

## 2024-02-23 DIAGNOSIS — Z86711 Personal history of pulmonary embolism: Secondary | ICD-10-CM | POA: Diagnosis not present

## 2024-02-23 DIAGNOSIS — L089 Local infection of the skin and subcutaneous tissue, unspecified: Secondary | ICD-10-CM | POA: Diagnosis not present

## 2024-02-23 DIAGNOSIS — M47812 Spondylosis without myelopathy or radiculopathy, cervical region: Secondary | ICD-10-CM | POA: Diagnosis not present

## 2024-02-23 DIAGNOSIS — E1122 Type 2 diabetes mellitus with diabetic chronic kidney disease: Secondary | ICD-10-CM | POA: Diagnosis not present

## 2024-02-23 DIAGNOSIS — I130 Hypertensive heart and chronic kidney disease with heart failure and stage 1 through stage 4 chronic kidney disease, or unspecified chronic kidney disease: Secondary | ICD-10-CM | POA: Diagnosis not present

## 2024-02-23 DIAGNOSIS — I4891 Unspecified atrial fibrillation: Secondary | ICD-10-CM | POA: Diagnosis not present

## 2024-02-23 DIAGNOSIS — E611 Iron deficiency: Secondary | ICD-10-CM | POA: Diagnosis not present

## 2024-02-23 DIAGNOSIS — Z09 Encounter for follow-up examination after completed treatment for conditions other than malignant neoplasm: Secondary | ICD-10-CM | POA: Diagnosis not present

## 2024-02-23 DIAGNOSIS — R161 Splenomegaly, not elsewhere classified: Secondary | ICD-10-CM

## 2024-02-23 DIAGNOSIS — D696 Thrombocytopenia, unspecified: Secondary | ICD-10-CM | POA: Diagnosis not present

## 2024-02-23 DIAGNOSIS — M79602 Pain in left arm: Secondary | ICD-10-CM | POA: Diagnosis not present

## 2024-02-23 DIAGNOSIS — I2699 Other pulmonary embolism without acute cor pulmonale: Secondary | ICD-10-CM | POA: Diagnosis not present

## 2024-02-23 DIAGNOSIS — N183 Chronic kidney disease, stage 3 unspecified: Secondary | ICD-10-CM | POA: Diagnosis not present

## 2024-02-23 DIAGNOSIS — J449 Chronic obstructive pulmonary disease, unspecified: Secondary | ICD-10-CM | POA: Diagnosis not present

## 2024-02-23 DIAGNOSIS — Z7985 Long-term (current) use of injectable non-insulin antidiabetic drugs: Secondary | ICD-10-CM

## 2024-02-23 DIAGNOSIS — I509 Heart failure, unspecified: Secondary | ICD-10-CM | POA: Diagnosis not present

## 2024-02-23 DIAGNOSIS — M069 Rheumatoid arthritis, unspecified: Secondary | ICD-10-CM | POA: Diagnosis not present

## 2024-02-23 NOTE — Progress Notes (Signed)
 02/23/2024 Name: Mitchell SKOG Sr. MRN: [id number] DOB: Sep 25, 1951  Chief Complaint  Patient presents with   Diabetes    Tavi Hoogendoorn. is a 73 y.o. year old male who presented for a telephone visit.I connected with  Mitchell BOECKMAN Sr. on 02/23/24 by telephone and verified that I am speaking with the correct person using two identifiers. I discussed the limitations of evaluation and management by telemedicine. The patient expressed understanding and agreed to proceed.  Patient was located in her home and PharmD in PCP office during this visit.   They were referred to the pharmacist by their PCP for assistance in managing diabetes and medication access.    Subjective:  Patient reports he was on Mounjaro last fall, but then went into the coverage gap with his Medicare insurance.  PCP has stopped glimepiride and started pioglitazone.  Patient is interested in trying to apply for assistance with GLP1 therapy.   Care Team: Primary Care Provider: [name], MD ;   Medication Access/Adherence  Current Pharmacy:  [facility]) - [place], [place] - [address] [facility] [address] [place] [address] [place] [address] Phone: [phone] Fax: [phone]  [facility] - [place], [place] - [address] [place] [address] Phone: [phone] Fax: [phone]  [id number] - [place], [place] - [address] [place] [address] Phone: [phone] Fax: [phone]   Patient reports affordability concerns with their medications: Yes  Patient reports access/transportation concerns to their pharmacy: No  Patient reports adherence concerns with their medications:  No     Diabetes:  Current medications: pioglitazone Medications tried in the past: Mounjaro  Current glucose readings: FBG<130  Patient denies hypoglycemic s/sx including dizziness, shakiness,  sweating. Patient denies hyperglycemic symptoms including polyuria, polydipsia, polyphagia, nocturia, neuropathy, blurred vision.  Current meal patterns:  Discussed meal planning options and Plate method for healthy eating Avoid sugary drinks and desserts Incorporate balanced protein, non starchy veggies, 1 serving of carbohydrate with each meal Increase water intake Increase physical activity as able  Current physical activity: encouraged as able  Current medication access support: needs Ozempic PAP; Mounjaro not affordable   Objective:  Lab Results  Component Value Date   HGBA1C 5.4 01/18/2024    Lab Results  Component Value Date   CREATININE 0.98 03/28/2024   BUN 29 (H) 03/28/2024   NA 139 03/28/2024   K 4.2 03/28/2024   CL 108 03/28/2024   CO2 22 03/28/2024    Lab Results  Component Value Date   CHOL 95 (L) 07/05/2023   HDL 32 (L) 07/05/2023   LDLCALC 40 07/05/2023   TRIG 131 07/05/2023   CHOLHDL 3.0 07/05/2023    Medications Reviewed Today     Reviewed by Danella Maiers, [facility] (Pharmacist) on 04/02/24 at [phone]  Med List Status: <None>   Medication Order Taking? Sig Documenting Provider Last Dose Status Informant  acetaminophen (TYLENOL) 500 MG tablet [id number] No Take 500 mg by mouth every 6 (six) hours as needed for mild pain. [provider] 03/27/2024 Active Self  allopurinol (ZYLOPRIM) 300 MG tablet [id number] No TAKE 1 TABLET BY MOUTH EVERY  Orie Fisherman, MD 03/27/2024 Active Self  amLODipine-olmesartan (AZOR) 10-40 MG tablet [id number] No TAKE 1 TABLET BY MOUTH DAILY [name], MD 03/27/2024 Active Self  atorvastatin (LIPITOR) 80 MG tablet [id number] No Take 1 tablet (80 mg  total) by mouth daily. [name], MD 03/27/2024 Active Self  betamethasone acetate-betamethasone sodium phosphate (CELESTONE) injection 6 mg [id number]   [name], MD  Active   calcitRIOL (ROCALTROL) 0.25 MCG capsule [id number] No Take 1 capsule (0.25 mcg total) by  mouth daily with breakfast. [name], MD 03/27/2024 Active Self  calcium carbonate (OSCAL) 1500 (600 Ca) MG TABS tablet [id number] No Take 600 mg of elemental calcium by mouth 2 (two) times daily with a meal. [provider] 03/27/2024 Active Self  carvedilol (COREG) 6.25 MG tablet [id number] No TAKE ONE-HALF TABLET BY MOUTH  EVERY MORNING AND 1 TABLET BY  MOUTH EVERY Nadeen Landau, MD 03/27/2024 Active Self  cloNIDine (CATAPRES) 0.2 MG tablet [id number] No TAKE 1 TABLET BY MOUTH TWICE  DAILY FOR BLOOD PRESSURE  Patient taking differently: Take 0.2 mg by mouth daily.   [name], MD 03/27/2024 Active Self  cyclobenzaprine (FLEXERIL) 5 MG tablet [id number]  Take 1-2 tablets (5-10 mg total) by mouth 3 (three) times daily as needed for muscle spasms. Patrici Ranks [place], PA-C  Active   docusate sodium (COLACE) 100 MG capsule [id number]  Take 1 capsule (100 mg total) by mouth 2 (two) times daily as needed for mild constipation or moderate constipation. Patrici Ranks [place], [place]  Active   DULoxetine (CYMBALTA) 30 MG capsule [id number] No TAKE 1 CAPSULE BY MOUTH TWICE  DAILY [name], MD 03/27/2024 Active Self  ferrous sulfate 325 (65 FE) MG tablet [id number] No Take 325 mg by mouth in the morning. [provider] 03/27/2024 Active Self  fexofenadine-pseudoephedrine (ALLEGRA-D 24) 180-240 MG 24 hr tablet [id number] No Take 1 tablet by mouth every evening. For allergy and congestion  Patient not taking: Reported on 03/26/2024   [name], MD Not Taking Active Self  furosemide (LASIX) 40 MG tablet [id number] No TAKE 1 TABLET BY MOUTH IN THE  Orie Fisherman, MD 03/27/2024 Active Self  glimepiride (AMARYL) 2 MG tablet [id number] No Take 2 mg by mouth daily with breakfast. [provider] 03/27/2024 Active Self  hydroxychloroquine (PLAQUENIL) 200 MG tablet [id number] No TAKE 1 TABLET BY MOUTH IN THE  MORNING AND AT BEDTIME [name], MD 03/27/2024 Active Self   Multiple Vitamin (MULTIVITAMIN WITH MINERALS) TABS tablet [id number] No Take 1 tablet by mouth in the morning. [provider] 03/27/2024 Active Self  oxyCODONE-acetaminophen (PERCOCET) 5-325 MG tablet [id number]  Take 1 tablet by mouth every 4 (four) hours as needed for severe pain (pain score 7-10). Patrici Ranks [place], [place]  Active   pioglitazone (ACTOS) 30 MG tablet [id number] No TAKE 1 TABLET BY MOUTH DAILY [name], MD 03/27/2024 Active Self  potassium chloride SA (KLOR-CON M) 20 MEQ tablet [id number] No TAKE 1 TABLET BY MOUTH DAILY FOR POTASSIUM REPLACEMENT/  SUPPLEMENT [name], MD 03/27/2024 Active Self  pregabalin (LYRICA) 300 MG capsule [id number] No Take 1 capsule (300 mg total) by mouth at bedtime. For neck [name], MD 03/27/2024 Active Self  silver sulfADIAZINE (SILVADENE) 1 % cream [id number] No Apply topically as needed. [provider] Past Week Active Self  tamsulosin (FLOMAX) 0.4 MG CAPS capsule [id number] No TAKE 1 CAPSULE BY MOUTH AT  BEDTIME [name], MD 03/27/2024 Active Self  vitamin B-12 (CYANOCOBALAMIN) 500 MCG tablet [id number] No Take 500 mcg by mouth in the morning. [provider] 03/27/2024 Active Self            Assessment/Plan:   Diabetes: - Currently controlled - Reviewed long term cardiovascular and renal outcomes  of uncontrolled blood sugar - Reviewed goal A1c, goal fasting, and goal 2 hour post prandial glucose - Reviewed dietary modifications including FOLLOWING A HEART HEALTHY DIET/HEALTHY PLATE METHOD - Reviewed lifestyle modifications including:  encouraged as able - Recommend to start Ozempic, continue pioglitazone for now  - Patient denies personal or family history of multiple endocrine neoplasia type 2, medullary thyroid cancer; personal history of pancreatitis or gallbladder disease. - Recommend to check glucose daily (fasting) or if symptomatic - Meets financial criteria for Ozempic patient assistance  program through [employer]. Will collaborate with provider, CPhT, and patient to pursue assistance.     Follow Up Plan: 4 weeks  30 min of patient care was provided to the patient during this visit time.  No charge for this visit   Kieth Brightly, PharmD, BCACP, CPP Clinical Pharmacist, [facility]

## 2024-02-23 NOTE — Progress Notes (Signed)
 [facility] at [facility]   REASON FOR VISIT:  Follow-up for thrombocytopenia and splenomegaly  PRIOR THERAPY: None  CURRENT THERAPY: Surveillance  INTERVAL HISTORY:   Mr. Boliver 73 y.o. male returns for routine follow-up of thrombocytopenia and splenomegaly.  He was last seen in clinic on 08/18/2023 by me.  Patient is a 73 year old male with past medical history significant for diabetes mellitus, neuropathy, COPD, rheumatoid arthritis, sleep apnea, nonischemic cardiomyopathy, hypertension, CKD, CHF, A-fib status post Watchman procedure last February recently completed 6 weeks of Eliquis then 6 months of Plavix.  He is currently not on either.  In the interim, he was evaluated on 02/07/2024 for an abscess to his left groin from an injury sustained while roughhousing with friends and chronic pain in his neck had worsened.  He was prescribed doxycycline and given a Tdap.  Culture of his groin grew Staphylococcus auris. CT cervical spine that did not show any acute cervical spine fracture but showed left predominant spondylosis and facet hypertrophy at C3-4, resulting in moderate left neural foraminal encroachment that appears worse than previous. Finished doxycycline with resolution of his abscess  Having left arm/shoulder pain 3rd/4th vertebrae. In sling. Sees neuro surgeon on Monday.  Currently on Lyrica which is making him very tired.  Denies any gastrointestinal concerns.  Denies any easy bruising or bleeding. He has not noticed any new lumps or bumps.  He denies any unexplained fevers, chills, night sweats, or weight loss.  He reports severe fatigue.  Reports he needed to rest walking from the parking lot into our office today.  Reports he is not sleeping well due to neck and left arm pain.  Reports he sleeps for 30 minutes at a time.  He has chronic insomnia and numbness in bilateral lower extremities.  He takes Cymbalta and gabapentin.   He has reports appetite at 50% and  energy levels are 0%.  Weight is stable.   ASSESSMENT & PLAN:  1.  Mild thrombocytopenia and splenomegaly: - Mild to moderate thrombocytopenia since October 2013. - US abdomen (12/13/2022): Spleen size 13.4 cm, spleen volume 982 cm cube.  Fatty liver. - Hematology workup (01/13/2023): Borderline low ferritin (33), with normal iron saturation. Normal CRP and ESR.  RF and ANA negative. Labs consistent with prior hepatitis B infection.  No evidence of hepatitis C. Mildly elevated kappa free light chains with ratio 1.92, likely secondary to CKD stage IIIb.  SPEP and immunofixation were negative. Normal copper, MMA, B12, folate. Normal LDH.  Normal reticulocytes. Immature platelet fraction 2.2%   2.  Iron deficiency (without anemia) -Etiology thought to be secondary to bariatric surgery. -Has required intermittent IV iron last given on 02/21/2023. -Patient is taking oral iron 325 mg daily.  Tolerating well. -No bleeding per patient.  3.  History of DVT and PE - Reports history of unprovoked PE and left leg DVT, was treated with warfarin x 5 years - No longer on anticoagulation due to history of subdural hematoma in February 2023, s/p burr holes placed in May 2023   4.  Rheumatoid arthritis: - Predominantly affects hands, diagnosed in 2010. - Has been on Plaquenil, not optimally controlled per patient.   5.  Social/family history: - Other PMH: Subdural hematoma x 2 secondary to traumatic falls, requiring bur hole evacuation.  DVT.  CKD stage IIIb.  COPD, hypertension, CHF, NICM, obesity, sleep apnea, type 2 diabetes mellitus, paroxysmal atrial fibrillation/flutter History of left leg DVT and unprovoked PE 10 years ago,  was treated with 5 years of warfarin.  Subdural hematoma in February 2023, bur holes in May 2023. Lost 100 pounds in the last 4 years after gastric sleeve surgery. - Lives at home with his wife.  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  Quit smoking in 1995.   Smoked 2 pack/day for 25 years. - No family history of cancer or leukemia.  Mother had low platelets. - Served in the [employer] and stationed in [place] in [place].  He was also in [place] [employer] in [place].   PLAN: 1. Iron deficiency (Primary) -Labs from 02/16/2024 show hemoglobin of 13.5 with a normal differential, iron saturations 24% with TIBC 315.  Ferritin is 135. -Received IV Iron back on 02/21/23.  -Continue oral iron at this time. -We discussed ferritin could be falsely elevated secondary to inflammation due to shoulder and neck pain. -She is symptomatic with extreme fatigue.  We discussed 1 dose of IV iron given iron saturations are slightly lower than before.  Hemoglobin is normal but was normal back in February when he received his last dose of IV iron.  Reports it did help him quite a bit energy wise. -To clinic in approximately 6 months with labs a few days before.  2. Thrombocytopenia (HCC) - Most recent CBC/D from 02/16/2024 show platelet count of 159. -Thrombocytopenia etiology likely secondary to mild splenomegaly, nutritional deficiency secondary to gastric sleeve surgery or underlying ITP or bone marrow infiltrative process such as low-grade MDS. -Discussed continuing to monitor at this time. -No obvious cause of thrombocytopenia based on lab work.   - We will see him for follow-up visit in 3 months for labs only and 6 months for follow-up.  3. Other pulmonary embolism without acute cor pulmonale, unspecified chronicity (HCC) -Monitor for now. -Not currently on anticoagulation. -No recurrent symptoms.  4. Wound infection -Developed a left groin abscess after roughhousing with some friends on 02/07/2024. -Wound culture grew Staphylococcus auris and he was placed on doxycycline. -She also received a Tdap. -Symptoms have resolved.  5. Left arm pain: -Sx onset 6 weeks -On Lyrica.  -See's neurosurgey on Monday.  Currently wearing a sling -Had steroid IM injection  for shoulder pain approximately 6 weeks ago.    PLAN SUMMARY: >> Recommend 1 dose of IV iron. >> Any oral iron supplements. >> Return to clinic in 6 months for labs and see a provider.     REVIEW OF SYSTEMS:   Review of Systems  Constitutional:  Positive for fatigue. Negative for fever.  Musculoskeletal:  Positive for arthralgias and neck pain.  Skin:  Positive for wound.  Neurological:  Positive for dizziness, headaches and numbness.  Psychiatric/Behavioral:  Positive for sleep disturbance. Negative for confusion. The patient is not nervous/anxious.      PHYSICAL EXAM:   Physical Exam Constitutional:      Appearance: Normal appearance.  Cardiovascular:     Rate and Rhythm: Normal rate and regular rhythm.  Pulmonary:     Effort: Pulmonary effort is normal.     Breath sounds: Normal breath sounds.  Abdominal:     General: Bowel sounds are normal.     Palpations: Abdomen is soft.  Musculoskeletal:        General: No swelling.     Left shoulder: Tenderness present. Decreased range of motion. Decreased strength.     Comments: In sling  Neurological:     Mental Status: He is alert and oriented to person, place, and time. Mental status  is at baseline.       PAST MEDICAL/SURGICAL HISTORY:  Past Medical History:  Diagnosis Date   Chronic diarrhea    CKD (chronic kidney disease) stage 3, GFR 30-59 ml/min (HCC)    COPD (chronic obstructive pulmonary disease) (HCC)    Dysrhythmia 02/2022   paroxsymal SVT & aflutter 02/2022 XioXT   Essential hypertension    Fibromyalgia    Gallstones    Gout    History of CHF (congestive heart failure)    History of colonic polyps    History of DVT (deep vein thrombosis) 04/19/2012   BLE DVT 04/09/12   Iron deficiency 02/16/2023   Kidney stones    Lumbar disc disease    Neuropathy    Nonischemic cardiomyopathy (HCC) 2003   Minor coronary atherosclerosis at cardiac catheterization 2003, LVEF initially 20% with normalization   Obesity     Orthostatic hypotension    Presence of Watchman left atrial appendage closure device 02/02/2023   27mm Watchman placed by Dr. Excell Seltzer   RA (rheumatoid arthritis) [facility])    Sleep apnea    Subarachnoid hemorrhage (HCC) 02/03/2022   Traumatic   Subdural hematoma (HCC) 02/03/2022   Traumatic   Syncope, vasovagal    Type 2 diabetes mellitus [facility])    Past Surgical History:  Procedure Laterality Date   BARIATRIC SURGERY  01/11/2018   BURR HOLE Right 05/04/2022   Procedure: BURR HOLES FOR Subdural Hematoma;  Surgeon: Bedelia Person, MD;  Location: [facility] OR;  Service: Neurosurgery;  Laterality: Right;   CHOLECYSTECTOMY  01/18/2012   Procedure: LAPAROSCOPIC CHOLECYSTECTOMY WITH INTRAOPERATIVE CHOLANGIOGRAM;  Surgeon: Clovis Pu. Cornett, MD;  Location: [facility] ORS;  Service: General;  Laterality: N/A;   COLONOSCOPY  05/2017   [facility]: five polyps removed but only 3 retrieved due to bowel prep, pathology with tubular adenomas   EYE SURGERY Right    Film over retina was removed.   I & D EXTREMITY Right 08/12/2023   Procedure: IRRIGATION AND DEBRIDEMENT RIGHT FOREARM;  Surgeon: Ramon Dredge, MD;  Location: [facility] OR;  Service: Orthopedics;  Laterality: Right;   LEFT ATRIAL APPENDAGE OCCLUSION N/A 02/02/2023   Procedure: LEFT ATRIAL APPENDAGE OCCLUSION;  Surgeon: Tonny Bollman, MD;  Location: [facility] INVASIVE CV LAB;  Service: Cardiovascular;  Laterality: N/A;   PILONIDAL CYST EXCISION  1997   TEE WITHOUT CARDIOVERSION N/A 02/02/2023   Procedure: TRANSESOPHAGEAL ECHOCARDIOGRAM;  Surgeon: Tonny Bollman, MD;  Location: [facility] INVASIVE CV LAB;  Service: Cardiovascular;  Laterality: N/A;   TOE AMPUTATION Right    3 toes on right foot- big toe and next 2   UMBILICAL HERNIA REPAIR     VARICOSE VEIN SURGERY     left leg   watchman implant      SOCIAL HISTORY:  Social History   Socioeconomic History   Marital status: Married    Spouse name: Claris Che   Number of children: 2   Years  of education: Not on file   Highest education level: Associate degree: occupational, Scientist, product/process development, or vocational program  Occupational History   Occupation: retired  Tobacco Use   Smoking status: Former    Current packs/day: 0.00    Average packs/day: 2.0 packs/day for 25.0 years (50.0 ttl pk-yrs)    Types: Cigarettes    Start date: 12/26/1969    Quit date: 12/26/1994    Years since quitting: 29.1   Smokeless tobacco: Never  Vaping Use   Vaping status: Never Used  Substance and Sexual Activity   Alcohol  use: No   Drug use: No   Sexual activity: Not Currently  Other Topics Concern   Not on file  Social History Narrative   Lives at home with wife, and has two adult children. Still ambulatory, no cane or walker.    Works 30 hours per week at [employer]   Has VA benefits - goes to [place] once or twice a year for vision, hearing, screenings, vaccines, etc.   Social Drivers of Health   Financial Resource Strain: Low Risk  (01/15/2024)   Overall Financial Resource Strain (CARDIA)    Difficulty of Paying Living Expenses: Not hard at all  Food Insecurity: No Food Insecurity (01/15/2024)   Hunger Vital Sign    Worried About Running Out of Food in the Last Year: Never true    Ran Out of Food in the Last Year: Never true  Transportation Needs: No Transportation Needs (01/15/2024)   PRAPARE - Administrator, Civil Service (Medical): No    Lack of Transportation (Non-Medical): No  Physical Activity: Insufficiently Active (01/15/2024)   Exercise Vital Sign    Days of Exercise per Week: 2 days    Minutes of Exercise per Session: 30 min  Stress: No Stress Concern Present (01/15/2024)   [employer] of Occupational Health - Occupational Stress Questionnaire    Feeling of Stress : Not at all  Social Connections: Socially Integrated (01/15/2024)   Social Connection and Isolation Panel [NHANES]    Frequency of Communication with Friends and Family: Once a week    Frequency of Social  Gatherings with Friends and Family: Twice a week    Attends Religious Services: More than 4 times per year    Active Member of [employer] or Organizations: No    Attends Engineer, structural: More than 4 times per year    Marital Status: Married  Catering manager Violence: Not At Risk (10/19/2023)   Humiliation, Afraid, Rape, and Kick questionnaire    Fear of Current or Ex-Partner: No    Emotionally Abused: No    Physically Abused: No    Sexually Abused: No    FAMILY HISTORY:  Family History  Problem Relation Age of Onset   Hypertension Mother    Stroke Mother    Diabetes Father    Hypertension Father    Heart disease Father    Diabetes Brother    Colon cancer Neg Hx     CURRENT MEDICATIONS:  Outpatient Encounter Medications as of 02/23/2024  Medication Sig   acetaminophen (TYLENOL) 500 MG tablet Take 500 mg by mouth every 6 (six) hours as needed for mild pain.   allopurinol (ZYLOPRIM) 300 MG tablet TAKE 1 TABLET BY MOUTH EVERY  MORNING   amLODipine-olmesartan (AZOR) 10-40 MG tablet TAKE 1 TABLET BY MOUTH DAILY   atorvastatin (LIPITOR) 80 MG tablet Take 1 tablet (80 mg total) by mouth daily.   calcitRIOL (ROCALTROL) 0.25 MCG capsule Take 1 capsule (0.25 mcg total) by mouth daily with breakfast.   calcium carbonate (OSCAL) 1500 (600 Ca) MG TABS tablet Take 600 mg of elemental calcium by mouth 2 (two) times daily with a meal.   carvedilol (COREG) 6.25 MG tablet TAKE ONE-HALF TABLET BY MOUTH  EVERY MORNING AND 1 TABLET BY  MOUTH EVERY EVENING   celecoxib (CELEBREX) 200 MG capsule TAKE 1 CAPSULE BY MOUTH DAILY  WITH FOOD   cloNIDine (CATAPRES) 0.2 MG tablet TAKE 1 TABLET BY MOUTH TWICE  DAILY FOR BLOOD PRESSURE   Colchicine 0.6 MG  CAPS Take 1 capsule by mouth daily. (Patient taking differently: Take 1 capsule by mouth as needed (gout flareups).)   doxycycline (VIBRAMYCIN) 100 MG capsule Take 1 capsule (100 mg total) by mouth 2 (two) times daily.   DULoxetine (CYMBALTA) 30 MG  capsule TAKE 1 CAPSULE BY MOUTH TWICE  DAILY   ferrous sulfate 325 (65 FE) MG tablet Take 325 mg by mouth in the morning.   fluocinolone (SYNALAR) 0.01 % external solution Place 3-5 drops in each ear canal twice daily (Patient taking differently: 3-5 drops 2 (two) times daily as needed (irritation/infection.). Place 3-5 drops in each ear canal twice daily)   furosemide (LASIX) 40 MG tablet TAKE 1 TABLET BY MOUTH IN THE  MORNING   HYDROcodone-acetaminophen (NORCO/VICODIN) 5-325 MG tablet Take 1 tablet by mouth every 6 (six) hours as needed for severe pain (pain score 7-10).   hydroxychloroquine (PLAQUENIL) 200 MG tablet TAKE 1 TABLET BY MOUTH IN THE  MORNING AND AT BEDTIME   Multiple Vitamin (MULTIVITAMIN WITH MINERALS) TABS tablet Take 1 tablet by mouth in the morning.   pioglitazone (ACTOS) 30 MG tablet TAKE 1 TABLET BY MOUTH DAILY   potassium chloride SA (KLOR-CON M) 20 MEQ tablet TAKE 1 TABLET BY MOUTH DAILY FOR POTASSIUM REPLACEMENT/  SUPPLEMENT   pregabalin (LYRICA) 300 MG capsule Take 1 capsule (300 mg total) by mouth at bedtime. For neck   silver sulfADIAZINE (SILVADENE) 1 % cream Apply topically as needed.   tamsulosin (FLOMAX) 0.4 MG CAPS capsule TAKE 1 CAPSULE BY MOUTH AT  BEDTIME   vitamin B-12 (CYANOCOBALAMIN) 500 MCG tablet Take 500 mcg by mouth in the morning.   [DISCONTINUED] atorvastatin (LIPITOR) 80 MG tablet TAKE 1 TABLET BY MOUTH DAILY AT  6 PM   [DISCONTINUED] calcitRIOL (ROCALTROL) 0.25 MCG capsule TAKE 1 CAPSULE BY MOUTH DAILY  WITH BREAKFAST   [DISCONTINUED] HYDROcodone-acetaminophen (NORCO/VICODIN) 5-325 MG tablet Take 1 tablet by mouth every 6 (six) hours as needed for severe pain (pain score 7-10).   [DISCONTINUED] predniSONE (DELTASONE) 20 MG tablet Take by mouth.   [DISCONTINUED] pregabalin (LYRICA) 50 MG capsule 1 qhs X7 days , then 2 qhs X 7d, then 3 qhs X 7d, then 4 qhs   No facility-administered encounter medications on file as of 02/23/2024.    ALLERGIES:   Allergies  Allergen Reactions   Ciprofloxacin Hives   Definity [Perflutren Lipid Microsphere] Nausea And Vomiting    During ECHO with Definity injection pt became nauseated and vomited    Levofloxacin [Levofloxacin] Hives    LABORATORY DATA:  I have reviewed the labs as listed.  CBC    Component Value Date/Time   WBC 7.3 02/16/2024 1204   RBC 4.02 (L) 02/16/2024 1204   HGB 13.5 02/16/2024 1204   HGB 14.3 01/18/2024 0812   HCT 39.8 02/16/2024 1204   HCT 42.7 01/18/2024 0812   PLT 159 02/16/2024 1204   PLT 123 (L) 01/18/2024 0812   MCV 99.0 02/16/2024 1204   MCV 100 (H) 01/18/2024 0812   MCH 33.6 02/16/2024 1204   MCHC 33.9 02/16/2024 1204   RDW 13.4 02/16/2024 1204   RDW 13.3 01/18/2024 0812   LYMPHSABS 1.0 02/16/2024 1204   LYMPHSABS 1.0 01/18/2024 0812   MONOABS 0.7 02/16/2024 1204   EOSABS 0.3 02/16/2024 1204   EOSABS 0.3 01/18/2024 0812   BASOSABS 0.0 02/16/2024 1204   BASOSABS 0.0 01/18/2024 0812      Latest Ref Rng & Units 02/16/2024   12:04 PM 02/07/2024  10:36 AM 01/18/2024    8:12 AM  CMP  Glucose 70 - 99 mg/dL 161  096  045   BUN 8 - 23 mg/dL 40  36  35   Creatinine 0.61 - 1.24 mg/dL 4.09  8.11  9.14   Sodium 135 - 145 mmol/L 137  136  141   Potassium 3.5 - 5.1 mmol/L 4.6  4.4  4.6   Chloride 98 - 111 mmol/L 101  101  103   CO2 22 - 32 mmol/L 26  26  23    Calcium 8.9 - 10.3 mg/dL 8.8  9.0  9.7   Total Protein 6.5 - 8.1 g/dL 7.0   6.7   Total Bilirubin 0.0 - 1.2 mg/dL 0.6   0.7   Alkaline Phos 38 - 126 U/L 63   50   AST 15 - 41 U/L 47   66   ALT 0 - 44 U/L 56   81     DIAGNOSTIC IMAGING:  I have independently reviewed the relevant imaging and discussed with the patient.   WRAP UP:  I discussed the assessment and treatment plan with the patient. The patient was provided an opportunity to ask questions and all were answered. The patient agreed with the plan and demonstrated an understanding of the instructions.   The patient was advised to call or  seek an in-person evaluation if the symptoms worsen or if the condition fails to improve as anticipated.  Medical decision making: moderate  Time spent on visit:I provided 25 minutes of non face-to-face telephone visit time during this encounter, and > 50% was spent counseling as documented under my assessment & plan.    Durenda Hurt, NP 02/23/2024 9:44 AM

## 2024-02-24 ENCOUNTER — Encounter: Payer: Self-pay | Admitting: Family Medicine

## 2024-02-26 ENCOUNTER — Encounter: Payer: Self-pay | Admitting: Hematology

## 2024-02-28 ENCOUNTER — Inpatient Hospital Stay: Payer: No Typology Code available for payment source | Attending: Hematology

## 2024-02-28 VITALS — BP 125/53 | HR 45 | Temp 96.7°F | Resp 20

## 2024-02-28 DIAGNOSIS — D509 Iron deficiency anemia, unspecified: Secondary | ICD-10-CM | POA: Diagnosis not present

## 2024-02-28 DIAGNOSIS — E611 Iron deficiency: Secondary | ICD-10-CM

## 2024-02-28 MED ORDER — SODIUM CHLORIDE 0.9 % IV SOLN: Freq: Once | INTRAVENOUS | Status: AC

## 2024-02-28 MED ORDER — FERUMOXYTOL INJECTION 510 MG/17 ML
510.0000 mg | Freq: Once | INTRAVENOUS | Status: AC
  Administered 2024-02-28: 510 mg via INTRAVENOUS
  Filled 2024-02-28: qty 17

## 2024-02-28 MED ORDER — CETIRIZINE HCL 10 MG/ML IV SOLN
10.0000 mg | Freq: Once | INTRAVENOUS | Status: AC
  Administered 2024-02-28: 10 mg via INTRAVENOUS
  Filled 2024-02-28: qty 1

## 2024-02-28 NOTE — Progress Notes (Signed)
Patient presents today for iron infusion.  Patient is in satisfactory condition with no new complaints voiced.  Vital signs are stable.  IV placed in L wrist.  IV flushed well with good blood return noted.  We will proceed with infusion per provider orders.    Patient tolerated infusion well with no complaints voiced.  Patient left ambulatory in stable condition.  Vital signs stable at discharge.  Follow up as scheduled.

## 2024-02-28 NOTE — Patient Instructions (Signed)
 [facility] [place] - A DEPT OF [facility]  Discharge Instructions: Thank you for choosing [facility] to provide your oncology and hematology care.  If you have a lab appointment with the Cancer Center - please note that after April 8th, 2024, all labs will be drawn in the cancer center.  You do not have to check in or register with the main entrance as you have in the past but will complete your check-in in the cancer center.  Wear comfortable clothing and clothing appropriate for easy access to any Portacath or PICC line.   We strive to give you quality time with your provider. You may need to reschedule your appointment if you arrive late (15 or more minutes).  Arriving late affects you and other patients whose appointments are after yours.  Also, if you miss three or more appointments without notifying the office, you may be dismissed from the clinic at the provider's discretion.      For prescription refill requests, have your pharmacy contact our office and allow 72 hours for refills to be completed.    Today you received the following:  Feraheme.  Ferumoxytol Injection What is this medication? FERUMOXYTOL (FER ue MOX i tol) treats low levels of iron in your body (iron deficiency anemia). Iron is a mineral that plays an important role in making red blood cells, which carry oxygen from your lungs to the rest of your body. This medicine may be used for other purposes; ask your health care provider or pharmacist if you have questions. COMMON BRAND NAME(S): Feraheme What should I tell my care team before I take this medication? They need to know if you have any of these conditions: Anemia not caused by low iron levels High levels of iron in the blood Magnetic resonance imaging (MRI) test scheduled An unusual or allergic reaction to iron, other medications, foods, dyes, or preservatives Pregnant or trying to get pregnant Breastfeeding How should I  use this medication? This medication is injected into a vein. It is given by your care team in a hospital or clinic setting. Talk to your care team the use of this medication in children. Special care may be needed. Overdosage: If you think you have taken too much of this medicine contact a poison control center or emergency room at once. NOTE: This medicine is only for you. Do not share this medicine with others. What if I miss a dose? It is important not to miss your dose. Call your care team if you are unable to keep an appointment. What may interact with this medication? Other iron products This list may not describe all possible interactions. Give your health care provider a list of all the medicines, herbs, non-prescription drugs, or dietary supplements you use. Also tell them if you smoke, drink alcohol, or use illegal drugs. Some items may interact with your medicine. What should I watch for while using this medication? Visit your care team for regular checks on your progress. Tell your care team if your symptoms do not start to get better or if they get worse. You may need blood work done while you are taking this medication. You may need to eat more foods that contain iron. Talk to your care team. Foods that contain iron include whole grains or cereals, dried fruits, beans, peas, leafy green vegetables, and organ meats (liver, kidney). What side effects may I notice from receiving this medication? Side effects that you should  report to your care team as soon as possible: Allergic reactions--skin rash, itching, hives, swelling of the face, lips, tongue, or throat Low blood pressure--dizziness, feeling faint or lightheaded, blurry vision Shortness of breath Side effects that usually do not require medical attention (report to your care team if they continue or are bothersome): Flushing Headache Joint pain Muscle pain Nausea Pain, redness, or irritation at injection site This list  may not describe all possible side effects. Call your doctor for medical advice about side effects. You may report side effects to FDA at 1-800-FDA-1088. Where should I keep my medication? This medication is given in a hospital or clinic. It will not be stored at home. NOTE: This sheet is a summary. It may not cover all possible information. If you have questions about this medicine, talk to your doctor, pharmacist, or health care provider.  2024 Elsevier/Gold Standard (2023-08-02 00:00:00)    To help prevent nausea and vomiting after your treatment, we encourage you to take your nausea medication as directed.  BELOW ARE SYMPTOMS THAT SHOULD BE REPORTED IMMEDIATELY: *FEVER GREATER THAN 100.4 F (38 C) OR HIGHER *CHILLS OR SWEATING *NAUSEA AND VOMITING THAT IS NOT CONTROLLED WITH YOUR NAUSEA MEDICATION *UNUSUAL SHORTNESS OF BREATH *UNUSUAL BRUISING OR BLEEDING *URINARY PROBLEMS (pain or burning when urinating, or frequent urination) *BOWEL PROBLEMS (unusual diarrhea, constipation, pain near the anus) TENDERNESS IN MOUTH AND THROAT WITH OR WITHOUT PRESENCE OF ULCERS (sore throat, sores in mouth, or a toothache) UNUSUAL RASH, SWELLING OR PAIN  UNUSUAL VAGINAL DISCHARGE OR ITCHING   Items with * indicate a potential emergency and should be followed up as soon as possible or go to the Emergency Department if any problems should occur.  Please show the CHEMOTHERAPY ALERT CARD or IMMUNOTHERAPY ALERT CARD at check-in to the Emergency Department and triage nurse.  Should you have questions after your visit or need to cancel or reschedule your appointment, please contact [facility] [place] - A DEPT OF Eligha Brideg[facility] [phone]  and follow the prompts.  Office hours are 8:00 a.m. to 4:30 p.m. Monday - Friday. Please note that voicemails left after 4:00 p.m. may not be returned until the following business day.  We are closed weekends and major holidays. You have access to a  nurse at all times for urgent questions. Please call the main number to the clinic [phone] and follow the prompts.  For any non-urgent questions, you may also contact your provider using MyChart. We now offer e-Visits for anyone 39 and older to request care online for non-urgent symptoms. For details visit [email or web address].   Also download the MyChart app! Go to the app store, search "MyChart", open the app, select [place], and log in with your MyChart username and password.

## 2024-03-04 ENCOUNTER — Encounter: Payer: Self-pay | Admitting: Physical Therapy

## 2024-03-04 ENCOUNTER — Encounter: Payer: Self-pay | Admitting: Hematology

## 2024-03-04 ENCOUNTER — Ambulatory Visit: Attending: Neurosurgery | Admitting: Physical Therapy

## 2024-03-04 DIAGNOSIS — M25612 Stiffness of left shoulder, not elsewhere classified: Secondary | ICD-10-CM | POA: Insufficient documentation

## 2024-03-04 DIAGNOSIS — M6281 Muscle weakness (generalized): Secondary | ICD-10-CM | POA: Diagnosis present

## 2024-03-04 DIAGNOSIS — M25512 Pain in left shoulder: Secondary | ICD-10-CM | POA: Insufficient documentation

## 2024-03-04 DIAGNOSIS — M542 Cervicalgia: Secondary | ICD-10-CM | POA: Insufficient documentation

## 2024-03-04 NOTE — Therapy (Signed)
 OUTPATIENT PHYSICAL THERAPY CERVICAL EVALUATION   Patient Name: FAROUK VIVERO Sr. MRN: [id number] DOB:1951/10/21, 73 y.o., male Today's Date: 03/04/2024  END OF SESSION:  PT End of Session - 03/04/24 1049     Visit Number 1    Number of Visits 12    Date for PT Re-Evaluation 04/15/24    PT Start Time 0847    PT Stop Time 0920    PT Time Calculation (min) 33 min    Activity Tolerance Patient tolerated treatment well    Behavior During Therapy [facility] for tasks assessed/performed             Past Medical History:  Diagnosis Date   Chronic diarrhea    CKD (chronic kidney disease) stage 3, GFR 30-59 ml/min (HCC)    COPD (chronic obstructive pulmonary disease) (HCC)    Dysrhythmia 02/2022   paroxsymal SVT & aflutter 02/2022 XioXT   Essential hypertension    Fibromyalgia    Gallstones    Gout    History of CHF (congestive heart failure)    History of colonic polyps    History of DVT (deep vein thrombosis) 04/19/2012   BLE DVT 04/09/12   Iron deficiency 02/16/2023   Kidney stones    Lumbar disc disease    Neuropathy    Nonischemic cardiomyopathy (HCC) 2003   Minor coronary atherosclerosis at cardiac catheterization 2003, LVEF initially 20% with normalization   Obesity    Orthostatic hypotension    Presence of Watchman left atrial appendage closure device 02/02/2023   27mm Watchman placed by Dr. Excell Seltzer   RA (rheumatoid arthritis) [facility])    Sleep apnea    Subarachnoid hemorrhage (HCC) 02/03/2022   Traumatic   Subdural hematoma (HCC) 02/03/2022   Traumatic   Syncope, vasovagal    Type 2 diabetes mellitus (HCC)    Past Surgical History:  Procedure Laterality Date   BARIATRIC SURGERY  01/11/2018   BURR HOLE Right 05/04/2022   Procedure: BURR HOLES FOR Subdural Hematoma;  Surgeon: Bedelia Person, MD;  Location: [facility] OR;  Service: Neurosurgery;  Laterality: Right;   CHOLECYSTECTOMY  01/18/2012   Procedure: LAPAROSCOPIC CHOLECYSTECTOMY WITH INTRAOPERATIVE CHOLANGIOGRAM;   Surgeon: Clovis Pu. Cornett, MD;  Location: [facility] ORS;  Service: General;  Laterality: N/A;   COLONOSCOPY  05/2017   [facility]: five polyps removed but only 3 retrieved due to bowel prep, pathology with tubular adenomas   EYE SURGERY Right    Film over retina was removed.   I & D EXTREMITY Right 08/12/2023   Procedure: IRRIGATION AND DEBRIDEMENT RIGHT FOREARM;  Surgeon: Ramon Dredge, MD;  Location: [facility] OR;  Service: Orthopedics;  Laterality: Right;   LEFT ATRIAL APPENDAGE OCCLUSION N/A 02/02/2023   Procedure: LEFT ATRIAL APPENDAGE OCCLUSION;  Surgeon: Tonny Bollman, MD;  Location: [facility] INVASIVE CV LAB;  Service: Cardiovascular;  Laterality: N/A;   PILONIDAL CYST EXCISION  1997   TEE WITHOUT CARDIOVERSION N/A 02/02/2023   Procedure: TRANSESOPHAGEAL ECHOCARDIOGRAM;  Surgeon: Tonny Bollman, MD;  Location: [facility] INVASIVE CV LAB;  Service: Cardiovascular;  Laterality: N/A;   TOE AMPUTATION Right    3 toes on right foot- big toe and next 2   UMBILICAL HERNIA REPAIR     VARICOSE VEIN SURGERY     left leg   watchman implant     Patient Active Problem List   Diagnosis Date Noted   Wound infection 08/11/2023   Iron deficiency 02/16/2023   PAF (paroxysmal atrial fibrillation) (HCC) 02/02/2023   Atrial fibrillation (  HCC) 02/02/2023   Presence of Watchman left atrial appendage closure device 02/02/2023   Diarrhea 11/16/2022   Anemia 11/16/2022   Subdural hematoma (HCC) 05/04/2022   SDH (subdural hematoma) (HCC) 02/03/2022   Right ear pain 07/13/2021   Venous insufficiency of right leg 08/19/2020   Thrombocytopenia (HCC) 09/08/2018   Chronic diastolic congestive heart failure (HCC) 08/17/2017   Hypokalemia 05/23/2012   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  THERAPY DIAG:  Cervicalgia  Acute pain of left shoulder  Stiffness of left shoulder, not elsewhere classified  Muscle weakness (generalized)  Rationale for Evaluation and Treatment: Rehabilitation  ONSET DATE: ~2 1/2 months.  SUBJECTIVE:                                                                                                                                                                                                         SUBJECTIVE STATEMENT: The patient presents to the clinic with left-sided neck pain with radiation into his left UE to elbow.  He states this came on about 2 1/2 months ago.  He is using a sling to help decrease his pain.  He also decreases his pain by placing the palm of his left hand behind his head.  His resting pain-level is a 4/10 and much higher with movement of his left UE.  He has an MRI scheduled for tomorrow.    PERTINENT HISTORY:  See above.  PAIN:  Are you having pain? Yes: NPRS scale: 4/10. Pain location: Left cervical, left UE Pain description: Ache, throb Aggravating factors: As above. Relieving factors: As above.  PRECAUTIONS: None  RED FLAGS: None     WEIGHT BEARING RESTRICTIONS: No  FALLS:  Has patient fallen in last 6 months? No  LIVING ENVIRONMENT: Lives with: lives with their spouse Lives in: House/apartment Has following equipment at home: None  OCCUPATION: Retired.  PLOF: Independent  PATIENT GOALS: Reduce pain.  Use left UE.   OBJECTIVE:  Note: Objective measures were completed at Evaluation unless otherwise noted.  PATIENT SURVEYS:  NDI 14/50.  POSTURE: rounded shoulders and forward head  PALPATION: Very  tender to palpation left cervical, especially C5-6 region.     CERVICAL/UE ROM:   Active left cervical rotation is 28 degrees and right is 55 degrees.  Active left shoulder  flexion to 73 degrees and passive to 135 degrees.  Full left shoulder ER is full though performed slowly.      UPPER EXTREMITY MMT:  Left shoulder flexion is 4-/5, ER is 4-/5, IR is 4 to 4+/5.  Left elbow flexion is 4/5.    CERVICAL SPECIAL TESTS:  Left biceps/Brachio DTR's less brisk than right.                                                                                                                            PATIENT EDUCATION:  Education details: Wall climbs to improve left shoulder range of motion.   Person educated: Patient Education method: Medical illustrator Education comprehension: verbalized understanding  HOME EXERCISE PROGRAM: As above.    ASSESSMENT:  CLINICAL IMPRESSION: The patient presents to [facility] with c/o left-sided neck pain and radiation of pain into his left UE that came on about 2 and a half months ago.  He is wearing a sling on the left to decrease his pain.  Putting his left palm behind his head helps decrease his pain.  His left Bi/Brachio DTR's are less brisk than the right.  He has some left UE weakness. His active left shoulder flexion and bilateral active cervical rotation is decreased.  His NDI score is 14/50.   OBJECTIVE IMPAIRMENTS: decreased activity tolerance, decreased ROM, decreased strength, and pain.   ACTIVITY LIMITATIONS: carrying, lifting, and reach over head  PARTICIPATION LIMITATIONS: meal prep, cleaning, laundry, and yard work  [facility] POTENTIAL: Good minus  CLINICAL DECISION MAKING: Evolving/moderate complexity  EVALUATION COMPLEXITY: Moderate   GOALS: LONG TERM GOALS: Target date: 04/15/24  Ind with a HEP.  Goal status: INITIAL  2.  Increase active cervical rotation to 60 degrees+ so patient can turn head more easily while driving.  Goal  status: INITIAL  3.  Active left shoulder flexion to 135 degrees so the patient can easily reach overhead.  Goal status: INITIAL  4.  Eliminate left UE radiation of pain.  Goal status: INITIAL  5.  Perform ADL's with pain not > 3/10. Goal status: INITIAL   PLAN:  PT FREQUENCY: 2x/week  PT DURATION: 6 weeks  PLANNED INTERVENTIONS: 97110-Therapeutic exercises, 97530- Therapeutic activity, [id number]- Neuromuscular re-education, 97535- Self Care, 16109- Manual therapy, G0283- Electrical stimulation (unattended), 97035- Ultrasound, 60454- Traction (mechanical), Cryotherapy, and Moist heat  PLAN FOR NEXT SESSION: Chin tucks and extension.  Pulleys, modalities and STW/M as needed.     Raymonda Pell, [place], PT 03/04/2024, 11:57 AM

## 2024-03-05 ENCOUNTER — Telehealth: Payer: Self-pay | Admitting: Family Medicine

## 2024-03-05 NOTE — Telephone Encounter (Signed)
 Copied from CRM [id number]. Topic: Clinical - Medication Question >> Mar 05, 2024  3:54 PM DeAngela L wrote: Reason for CRM: Patient received letter stating the HCP application has not been filed/completed yet for him to approved for the ozempic medication, and pt says he spoke with Kandra Nicolas. last week about the ozempic being approved but received a letter saying its not application was not completed with Novo nor disk Patient would like a call back about.

## 2024-03-06 ENCOUNTER — Telehealth: Payer: Self-pay | Admitting: Family Medicine

## 2024-03-06 NOTE — Telephone Encounter (Signed)
 Copied from CRM [id number]. Topic: Clinical - Prescription Issue >> Mar 06, 2024  3:50 PM Everette C wrote: Reason for CRM: The patient has been notified by [employer] that page 8 of their application for coverage of their Ozempic prescription requires a signature   The patient will also need their Patient ID included Patient ID [id number]  Page 8 requires resubmission from the practice when possible from pharmacy staff member Kandra Nicolas.   Please contact the patient further if needed

## 2024-03-06 NOTE — Telephone Encounter (Signed)
 Application submitted 02/27/24 for novo nordisk assistance. Resent the provider signature page 03/06/24 due to signature not being visible on scan.

## 2024-03-07 NOTE — Telephone Encounter (Signed)
 Corrected page was refaxed 03/06/24

## 2024-03-08 ENCOUNTER — Ambulatory Visit

## 2024-03-08 DIAGNOSIS — M25612 Stiffness of left shoulder, not elsewhere classified: Secondary | ICD-10-CM

## 2024-03-08 DIAGNOSIS — M542 Cervicalgia: Secondary | ICD-10-CM | POA: Diagnosis not present

## 2024-03-08 DIAGNOSIS — M25512 Pain in left shoulder: Secondary | ICD-10-CM

## 2024-03-08 DIAGNOSIS — M6281 Muscle weakness (generalized): Secondary | ICD-10-CM

## 2024-03-08 NOTE — Therapy (Signed)
 OUTPATIENT PHYSICAL THERAPY CERVICAL EVALUATION   Patient Name: Mitchell STUCKY Sr. MRN: [id number] DOB:[birth year], 73 y.o., male Today's Date: 03/08/2024  END OF SESSION:  PT End of Session - 03/08/24 0924     Visit Number 2    Number of Visits 12    Date for PT Re-Evaluation 04/15/24    PT Start Time 0845    PT Stop Time 0935    PT Time Calculation (min) 50 min    Activity Tolerance Patient tolerated treatment well    Behavior During Therapy [facility] for tasks assessed/performed             Past Medical History:  Diagnosis Date   Chronic diarrhea    CKD (chronic kidney disease) stage 3, GFR 30-59 ml/min (HCC)    COPD (chronic obstructive pulmonary disease) (HCC)    Dysrhythmia 02/2022   paroxsymal SVT & aflutter 02/2022 XioXT   Essential hypertension    Fibromyalgia    Gallstones    Gout    History of CHF (congestive heart failure)    History of colonic polyps    History of DVT (deep vein thrombosis) 04/19/2012   BLE DVT 04/09/12   Iron deficiency 02/16/2023   Kidney stones    Lumbar disc disease    Neuropathy    Nonischemic cardiomyopathy (HCC) 2003   Minor coronary atherosclerosis at cardiac catheterization 2003, LVEF initially 20% with normalization   Obesity    Orthostatic hypotension    Presence of Watchman left atrial appendage closure device 02/02/2023   27mm Watchman placed by Dr. Excell Seltzer   RA (rheumatoid arthritis) [facility])    Sleep apnea    Subarachnoid hemorrhage (HCC) 02/03/2022   Traumatic   Subdural hematoma (HCC) 02/03/2022   Traumatic   Syncope, vasovagal    Type 2 diabetes mellitus (HCC)    Past Surgical History:  Procedure Laterality Date   BARIATRIC SURGERY  01/11/2018   BURR HOLE Right 05/04/2022   Procedure: BURR HOLES FOR Subdural Hematoma;  Surgeon: Bedelia Person, MD;  Location: [facility] OR;  Service: Neurosurgery;  Laterality: Right;   CHOLECYSTECTOMY  01/18/2012   Procedure: LAPAROSCOPIC CHOLECYSTECTOMY WITH INTRAOPERATIVE CHOLANGIOGRAM;   Surgeon: Clovis Pu. Cornett, MD;  Location: [facility] ORS;  Service: General;  Laterality: N/A;   COLONOSCOPY  05/2017   [facility]: five polyps removed but only 3 retrieved due to bowel prep, pathology with tubular adenomas   EYE SURGERY Right    Film over retina was removed.   I & D EXTREMITY Right 08/12/2023   Procedure: IRRIGATION AND DEBRIDEMENT RIGHT FOREARM;  Surgeon: Ramon Dredge, MD;  Location: [facility] OR;  Service: Orthopedics;  Laterality: Right;   LEFT ATRIAL APPENDAGE OCCLUSION N/A 02/02/2023   Procedure: LEFT ATRIAL APPENDAGE OCCLUSION;  Surgeon: Tonny Bollman, MD;  Location: [facility] INVASIVE CV LAB;  Service: Cardiovascular;  Laterality: N/A;   PILONIDAL CYST EXCISION  1997   TEE WITHOUT CARDIOVERSION N/A 02/02/2023   Procedure: TRANSESOPHAGEAL ECHOCARDIOGRAM;  Surgeon: Tonny Bollman, MD;  Location: [facility] INVASIVE CV LAB;  Service: Cardiovascular;  Laterality: N/A;   TOE AMPUTATION Right    3 toes on right foot- big toe and next 2   UMBILICAL HERNIA REPAIR     VARICOSE VEIN SURGERY     left leg   watchman implant     Patient Active Problem List   Diagnosis Date Noted   Wound infection 08/11/2023   Iron deficiency 02/16/2023   PAF (paroxysmal atrial fibrillation) (HCC) 02/02/2023   Atrial fibrillation (  HCC) 02/02/2023   Presence of Watchman left atrial appendage closure device 02/02/2023   Diarrhea 11/16/2022   Anemia 11/16/2022   Subdural hematoma (HCC) 05/04/2022   SDH (subdural hematoma) (HCC) 02/03/2022   Right ear pain 07/13/2021   Venous insufficiency of right leg 08/19/2020   Thrombocytopenia (HCC) 09/08/2018   Chronic diastolic congestive heart failure (HCC) 08/17/2017   Hypokalemia 05/23/2012   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  THERAPY DIAG:  Cervicalgia  Acute pain of left shoulder  Stiffness of left shoulder, not elsewhere classified  Muscle weakness (generalized)  Rationale for Evaluation and Treatment: Rehabilitation  ONSET DATE: ~2 1/2 months.  SUBJECTIVE:                                                                                                                                                                                                         SUBJECTIVE STATEMENT: Pt reports 4-5/10 left cervical and shoulder pain.  Pt states he has an appointment with neurologist on 3/24 to read MRI.  PERTINENT HISTORY:  See above.  PAIN:  Are you having pain? Yes: NPRS scale: 4-5/10. Pain location: Left cervical, left UE Pain description: Ache, throb Aggravating factors: As above. Relieving factors: As above.  PRECAUTIONS: None  RED FLAGS: None     WEIGHT BEARING RESTRICTIONS: No  FALLS:  Has patient fallen in last 6 months? No  LIVING ENVIRONMENT: Lives with: lives with their spouse Lives in: House/apartment Has following equipment at home: None  OCCUPATION: Retired.  PLOF: Independent  PATIENT GOALS: Reduce pain.  Use left UE.   OBJECTIVE:  Note: Objective measures were completed at Evaluation unless otherwise noted.  PATIENT SURVEYS:  NDI 14/50.  POSTURE: rounded shoulders and forward head  PALPATION: Very tender to palpation left cervical, especially C5-6 region.     CERVICAL/UE ROM:   Active left cervical rotation is 28 degrees and right is 55 degrees.  Active left shoulder  flexion to 73 degrees and passive to 135 degrees.  Full left shoulder ER is full though performed  slowly.    UPPER EXTREMITY MMT:  Left shoulder flexion is 4-/5, ER is 4-/5, IR is 4 to 4+/5.  Left elbow flexion is 4/5.    CERVICAL SPECIAL TESTS:  Left biceps/Brachio DTR's less brisk than right.                                                                                                                      TODAY'S TREATMENT:    03/08/24                         Manual Therapy Soft Tissue Mobilization: left cervical and shoulder, STW/M to left upper trap and left middle deltoid to decrease pain and tone.     Modalities: 1 of 6  Date:  Unattended Estim: Cervical and Shoulder, IFC 80-150 hz, 15 mins, Pain and Tone Hot Pack: Cervical and Shoulder, 15 mins, Pain and Tone   PATIENT EDUCATION:  Education details: Wall climbs to improve left shoulder range of motion.   Person educated: Patient Education method: Medical illustrator Education comprehension: verbalized understanding  HOME EXERCISE PROGRAM: As above.    ASSESSMENT:  CLINICAL IMPRESSION: Pt arrives for today's treatment session reporting 4-5/10 left cervical and shoulder pain.  Pt states he does not feel comfortable beginning exercises today due to pain level.  STW/M performed to left upper trap and middle deltoid to decrease pain and tone, with several trigger points found in middle delt.  Normal responses to estim and Mh noted upon removal.  Pt reported 2/10 left cervical and shoulder pain at completion of today's treatment session.  OBJECTIVE IMPAIRMENTS: decreased activity tolerance, decreased ROM, decreased strength, and pain.   ACTIVITY LIMITATIONS: carrying, lifting, and reach over head  PARTICIPATION LIMITATIONS: meal prep, cleaning, laundry, and yard work  [facility] POTENTIAL: Good minus  CLINICAL DECISION MAKING: Evolving/moderate complexity  EVALUATION COMPLEXITY: Moderate   GOALS: LONG TERM GOALS: Target date: 04/15/24  Ind with a HEP.  Goal status: INITIAL  2.  Increase active cervical  rotation to 60 degrees+ so patient can turn head more easily while driving.  Goal status: INITIAL  3.  Active left shoulder flexion to 135 degrees so the patient can easily reach overhead.  Goal status: INITIAL  4.  Eliminate left UE radiation of pain.  Goal status: INITIAL  5.  Perform ADL's with pain not > 3/10. Goal status: INITIAL   PLAN:  PT FREQUENCY: 2x/week  PT DURATION: 6 weeks  PLANNED INTERVENTIONS: 97110-Therapeutic exercises, 97530- Therapeutic activity, [id number]- Neuromuscular re-education, 97535- Self Care, [id number]- Manual therapy, [id number]- Electrical stimulation (unattended), 97035- Ultrasound, [id number]- Traction (mechanical), Cryotherapy, and Moist heat  PLAN FOR NEXT SESSION: Chin tucks and extension.  Pulleys, modalities and STW/M as needed.     Newman Pies, PTA 03/08/2024, 9:41 AM

## 2024-03-11 ENCOUNTER — Encounter: Payer: Self-pay | Admitting: Family Medicine

## 2024-03-11 ENCOUNTER — Ambulatory Visit

## 2024-03-11 DIAGNOSIS — M542 Cervicalgia: Secondary | ICD-10-CM

## 2024-03-11 DIAGNOSIS — M6281 Muscle weakness (generalized): Secondary | ICD-10-CM

## 2024-03-11 DIAGNOSIS — M25612 Stiffness of left shoulder, not elsewhere classified: Secondary | ICD-10-CM

## 2024-03-11 DIAGNOSIS — M25512 Pain in left shoulder: Secondary | ICD-10-CM

## 2024-03-11 NOTE — Therapy (Signed)
 OUTPATIENT PHYSICAL THERAPY CERVICAL TREATMENT   Patient Name: Mitchell TONES Sr. MRN: [id number] DOB:1951-03-09, 73 y.o., male Today's Date: 03/11/2024  END OF SESSION:  PT End of Session - 03/11/24 0804     Visit Number 3    Number of Visits 12    Date for PT Re-Evaluation 04/15/24    PT Start Time 0800    PT Stop Time 0853    PT Time Calculation (min) 53 min    Activity Tolerance Patient tolerated treatment well    Behavior During Therapy [facility] for tasks assessed/performed             Past Medical History:  Diagnosis Date   Chronic diarrhea    CKD (chronic kidney disease) stage 3, GFR 30-59 ml/min (HCC)    COPD (chronic obstructive pulmonary disease) (HCC)    Dysrhythmia 02/2022   paroxsymal SVT & aflutter 02/2022 XioXT   Essential hypertension    Fibromyalgia    Gallstones    Gout    History of CHF (congestive heart failure)    History of colonic polyps    History of DVT (deep vein thrombosis) 04/19/2012   BLE DVT 04/09/12   Iron deficiency 02/16/2023   Kidney stones    Lumbar disc disease    Neuropathy    Nonischemic cardiomyopathy (HCC) 2003   Minor coronary atherosclerosis at cardiac catheterization 2003, LVEF initially 20% with normalization   Obesity    Orthostatic hypotension    Presence of Watchman left atrial appendage closure device 02/02/2023   27mm Watchman placed by Dr. Excell Seltzer   RA (rheumatoid arthritis) [facility])    Sleep apnea    Subarachnoid hemorrhage (HCC) 02/03/2022   Traumatic   Subdural hematoma (HCC) 02/03/2022   Traumatic   Syncope, vasovagal    Type 2 diabetes mellitus (HCC)    Past Surgical History:  Procedure Laterality Date   BARIATRIC SURGERY  01/11/2018   BURR HOLE Right 05/04/2022   Procedure: BURR HOLES FOR Subdural Hematoma;  Surgeon: Bedelia Person, MD;  Location: [facility] OR;  Service: Neurosurgery;  Laterality: Right;   CHOLECYSTECTOMY  01/18/2012   Procedure: LAPAROSCOPIC CHOLECYSTECTOMY WITH INTRAOPERATIVE CHOLANGIOGRAM;   Surgeon: Clovis Pu. Cornett, MD;  Location: [facility] ORS;  Service: General;  Laterality: N/A;   COLONOSCOPY  05/2017   [facility]: five polyps removed but only 3 retrieved due to bowel prep, pathology with tubular adenomas   EYE SURGERY Right    Film over retina was removed.   I & D EXTREMITY Right 08/12/2023   Procedure: IRRIGATION AND DEBRIDEMENT RIGHT FOREARM;  Surgeon: Ramon Dredge, MD;  Location: [facility] OR;  Service: Orthopedics;  Laterality: Right;   LEFT ATRIAL APPENDAGE OCCLUSION N/A 02/02/2023   Procedure: LEFT ATRIAL APPENDAGE OCCLUSION;  Surgeon: Tonny Bollman, MD;  Location: [facility] INVASIVE CV LAB;  Service: Cardiovascular;  Laterality: N/A;   PILONIDAL CYST EXCISION  1997   TEE WITHOUT CARDIOVERSION N/A 02/02/2023   Procedure: TRANSESOPHAGEAL ECHOCARDIOGRAM;  Surgeon: Tonny Bollman, MD;  Location: [facility] INVASIVE CV LAB;  Service: Cardiovascular;  Laterality: N/A;   TOE AMPUTATION Right    3 toes on right foot- big toe and next 2   UMBILICAL HERNIA REPAIR     VARICOSE VEIN SURGERY     left leg   watchman implant     Patient Active Problem List   Diagnosis Date Noted   Wound infection 08/11/2023   Iron deficiency 02/16/2023   PAF (paroxysmal atrial fibrillation) (HCC) 02/02/2023   Atrial fibrillation (  HCC) 02/02/2023   Presence of Watchman left atrial appendage closure device 02/02/2023   Diarrhea 11/16/2022   Anemia 11/16/2022   Subdural hematoma (HCC) 05/04/2022   SDH (subdural hematoma) (HCC) 02/03/2022   Right ear pain 07/13/2021   Venous insufficiency of right leg 08/19/2020   Thrombocytopenia (HCC) 09/08/2018   Chronic diastolic congestive heart failure (HCC) 08/17/2017   Hypokalemia 05/23/2012   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  THERAPY DIAG:  Cervicalgia  Acute pain of left shoulder  Stiffness of left shoulder, not elsewhere classified  Muscle weakness (generalized)  Rationale for Evaluation and Treatment: Rehabilitation  ONSET DATE: ~2 1/2 months.  SUBJECTIVE:                                                                                                                                                                                                         SUBJECTIVE STATEMENT: Pt reports 2-3/10 left cervical and shoulder pain.  Pt reports that his shoulder felt really good after his last treatment session until he went to bed that night where his pain increased 8/10.    PERTINENT HISTORY:  See above.  PAIN:  Are you having pain? Yes: NPRS scale: 2-3/10. Pain location: Left cervical, left UE Pain description: Ache, throb Aggravating factors: As above. Relieving factors: As above.  PRECAUTIONS: None  RED FLAGS: None     WEIGHT BEARING RESTRICTIONS: No  FALLS:  Has patient fallen in last 6 months? No  LIVING ENVIRONMENT: Lives with: lives with their spouse Lives in: House/apartment Has following equipment at home: None  OCCUPATION: Retired.  PLOF: Independent  PATIENT GOALS: Reduce pain.  Use left UE.   OBJECTIVE:  Note: Objective measures were completed at Evaluation unless otherwise noted.  PATIENT SURVEYS:  NDI 14/50.  POSTURE: rounded shoulders and forward head  PALPATION: Very tender to palpation left cervical, especially C5-6 region.     CERVICAL/UE ROM:   Active left cervical rotation is 28 degrees and right is 55 degrees.  Active left shoulder  flexion to 73 degrees and  passive to 135 degrees.  Full  left shoulder ER is full though performed slowly.    UPPER EXTREMITY MMT:  Left shoulder flexion is 4-/5, ER is 4-/5, IR is 4 to 4+/5.  Left elbow flexion is 4/5.    CERVICAL SPECIAL TESTS:  Left biceps/Brachio DTR's less brisk than right.                                                                                                                      TODAY'S TREATMENT:  03/11/24                          Manual Therapy Soft Tissue Mobilization: left cervical and shoulder, STW/M to left upper trap and left middle deltoid to decrease pain and tone.     Modalities: 2 of 6  Date:  Unattended Estim: Cervical and Shoulder, IFC 80-150 hz, 15 mins, Pain and Tone Hot Pack: Cervical and Shoulder, 15 mins, Pain and Tone   PATIENT EDUCATION:  Education details: Wall climbs to improve left shoulder range of motion.   Person educated: Patient Education method: Medical illustrator Education comprehension: verbalized understanding  HOME EXERCISE PROGRAM: As above.    ASSESSMENT:  CLINICAL IMPRESSION: Pt arrives for today's treatment session reporting 2-3/10 left cervical and shoulder pain.   Pt reports that he felt really good the day of last treatment, but that night pain came back to 8/10 with constant throbbing pain.  STW/M performed to left cervical paraspinals and upper trap as well as left middle deltoid and bicep.  Normal responses to estim and MH noted upon removal.  Pt reported 1-2/10 left neck and shoulder pain at completion of today's treatment session.   OBJECTIVE IMPAIRMENTS: decreased activity tolerance, decreased ROM, decreased strength, and pain.   ACTIVITY LIMITATIONS: carrying, lifting, and reach over head  PARTICIPATION LIMITATIONS: meal prep, cleaning, laundry, and yard work  [facility] POTENTIAL: Good minus  CLINICAL DECISION MAKING: Evolving/moderate complexity  EVALUATION COMPLEXITY: Moderate   GOALS: LONG TERM GOALS:  Target date: 04/15/24  Ind with a HEP.  Goal status: INITIAL  2.  Increase active cervical rotation to 60 degrees+ so patient can turn head more easily while driving.  Goal status: INITIAL  3.  Active left shoulder flexion to 135 degrees so the patient can easily reach overhead.  Goal status: INITIAL  4.  Eliminate left UE radiation of pain.  Goal status: INITIAL  5.  Perform ADL's with pain not > 3/10. Goal status: INITIAL   PLAN:  PT FREQUENCY: 2x/week  PT DURATION: 6 weeks  PLANNED INTERVENTIONS: 97110-Therapeutic exercises, 97530- Therapeutic activity, [id number]- Neuromuscular re-education, 97535- Self Care, [id number]- Manual therapy, [id number]- Electrical stimulation (unattended), 97035- Ultrasound, 29528- Traction (mechanical), Cryotherapy, and Moist heat  PLAN FOR NEXT SESSION: Chin tucks and extension.  Pulleys, modalities and STW/M as needed.     Newman Pies, PTA 03/11/2024, 8:59 AM

## 2024-03-15 ENCOUNTER — Ambulatory Visit

## 2024-03-15 DIAGNOSIS — M6281 Muscle weakness (generalized): Secondary | ICD-10-CM

## 2024-03-15 DIAGNOSIS — M25512 Pain in left shoulder: Secondary | ICD-10-CM

## 2024-03-15 DIAGNOSIS — M542 Cervicalgia: Secondary | ICD-10-CM | POA: Diagnosis not present

## 2024-03-15 DIAGNOSIS — M25612 Stiffness of left shoulder, not elsewhere classified: Secondary | ICD-10-CM

## 2024-03-15 NOTE — Therapy (Signed)
 OUTPATIENT PHYSICAL THERAPY CERVICAL TREATMENT   Patient Name: Mitchell HYLE Sr. MRN: [id number] DOB:11-14-1951, 73 y.o., male Today's Date: 03/15/2024  END OF SESSION:  PT End of Session - 03/15/24 0909     Visit Number 4    Number of Visits 12    Date for PT Re-Evaluation 04/15/24    PT Start Time 0847    PT Stop Time 0929    PT Time Calculation (min) 42 min    Activity Tolerance Patient tolerated treatment well    Behavior During Therapy [facility] for tasks assessed/performed             Past Medical History:  Diagnosis Date   Chronic diarrhea    CKD (chronic kidney disease) stage 3, GFR 30-59 ml/min (HCC)    COPD (chronic obstructive pulmonary disease) (HCC)    Dysrhythmia 02/2022   paroxsymal SVT & aflutter 02/2022 XioXT   Essential hypertension    Fibromyalgia    Gallstones    Gout    History of CHF (congestive heart failure)    History of colonic polyps    History of DVT (deep vein thrombosis) 04/19/2012   BLE DVT 04/09/12   Iron deficiency 02/16/2023   Kidney stones    Lumbar disc disease    Neuropathy    Nonischemic cardiomyopathy (HCC) 2003   Minor coronary atherosclerosis at cardiac catheterization 2003, LVEF initially 20% with normalization   Obesity    Orthostatic hypotension    Presence of Watchman left atrial appendage closure device 02/02/2023   27mm Watchman placed by Dr. Excell Seltzer   RA (rheumatoid arthritis) [facility])    Sleep apnea    Subarachnoid hemorrhage (HCC) 02/03/2022   Traumatic   Subdural hematoma (HCC) 02/03/2022   Traumatic   Syncope, vasovagal    Type 2 diabetes mellitus (HCC)    Past Surgical History:  Procedure Laterality Date   BARIATRIC SURGERY  01/11/2018   BURR HOLE Right 05/04/2022   Procedure: BURR HOLES FOR Subdural Hematoma;  Surgeon: Bedelia Person, MD;  Location: [facility] OR;  Service: Neurosurgery;  Laterality: Right;   CHOLECYSTECTOMY  01/18/2012   Procedure: LAPAROSCOPIC CHOLECYSTECTOMY WITH INTRAOPERATIVE CHOLANGIOGRAM;   Surgeon: Clovis Pu. Cornett, MD;  Location: [facility] ORS;  Service: General;  Laterality: N/A;   COLONOSCOPY  05/2017   [facility]: five polyps removed but only 3 retrieved due to bowel prep, pathology with tubular adenomas   EYE SURGERY Right    Film over retina was removed.   I & D EXTREMITY Right 08/12/2023   Procedure: IRRIGATION AND DEBRIDEMENT RIGHT FOREARM;  Surgeon: Ramon Dredge, MD;  Location: [facility] OR;  Service: Orthopedics;  Laterality: Right;   LEFT ATRIAL APPENDAGE OCCLUSION N/A 02/02/2023   Procedure: LEFT ATRIAL APPENDAGE OCCLUSION;  Surgeon: Tonny Bollman, MD;  Location: [facility] INVASIVE CV LAB;  Service: Cardiovascular;  Laterality: N/A;   PILONIDAL CYST EXCISION  1997   TEE WITHOUT CARDIOVERSION N/A 02/02/2023   Procedure: TRANSESOPHAGEAL ECHOCARDIOGRAM;  Surgeon: Tonny Bollman, MD;  Location: [facility] INVASIVE CV LAB;  Service: Cardiovascular;  Laterality: N/A;   TOE AMPUTATION Right    3 toes on right foot- big toe and next 2   UMBILICAL HERNIA REPAIR     VARICOSE VEIN SURGERY     left leg   watchman implant     Patient Active Problem List   Diagnosis Date Noted   Wound infection 08/11/2023   Iron deficiency 02/16/2023   PAF (paroxysmal atrial fibrillation) (HCC) 02/02/2023   Atrial fibrillation (  HCC) 02/02/2023   Presence of Watchman left atrial appendage closure device 02/02/2023   Diarrhea 11/16/2022   Anemia 11/16/2022   Subdural hematoma (HCC) 05/04/2022   SDH (subdural hematoma) (HCC) 02/03/2022   Right ear pain 07/13/2021   Venous insufficiency of right leg 08/19/2020   Thrombocytopenia (HCC) 09/08/2018   Chronic diastolic congestive heart failure (HCC) 08/17/2017   Hypokalemia 05/23/2012   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  THERAPY DIAG:  Cervicalgia  Acute pain of left shoulder  Stiffness of left shoulder, not elsewhere classified  Muscle weakness (generalized)  Rationale for Evaluation and Treatment: Rehabilitation  ONSET DATE: ~2 1/2 months.  SUBJECTIVE:                                                                                                                                                                                                         SUBJECTIVE STATEMENT: Pt reports 2-3/10 left cervical and shoulder pain.  Pt reports that his shoulder felt really good after his last treatment session until he went to bed that night where his pain increased 8/10.    PERTINENT HISTORY:  See above.  PAIN:  Are you having pain? Yes: NPRS scale: 2-3/10. Pain location: Left cervical, left UE Pain description: Ache, throb Aggravating factors: As above. Relieving factors: As above.  PRECAUTIONS: None  RED FLAGS: None     WEIGHT BEARING RESTRICTIONS: No  FALLS:  Has patient fallen in last 6 months? No  LIVING ENVIRONMENT: Lives with: lives with their spouse Lives in: House/apartment Has following equipment at home: None  OCCUPATION: Retired.  PLOF: Independent  PATIENT GOALS: Reduce pain.  Use left UE.   OBJECTIVE:  Note: Objective measures were completed at Evaluation unless otherwise noted.  PATIENT SURVEYS:  NDI 14/50.  POSTURE: rounded shoulders and forward head  PALPATION: Very tender to palpation left cervical, especially C5-6 region.     CERVICAL/UE ROM:   Active left cervical rotation is 28 degrees and right is 55 degrees.  Active left shoulder  flexion to 73 degrees and  passive to 135 degrees.  Full  left shoulder ER is full though performed slowly.    UPPER EXTREMITY MMT:  Left shoulder flexion is 4-/5, ER is 4-/5, IR is 4 to 4+/5.  Left elbow flexion is 4/5.    CERVICAL SPECIAL TESTS:  Left biceps/Brachio DTR's less brisk than right.                                                                                                                      TODAY'S TREATMENT:  03/11/24                          Manual Therapy Soft Tissue Mobilization: left cervical and shoulder, STW/M to left upper trap and left middle deltoid to decrease pain and tone.     Modalities: 2 of 6  Date:  Unattended Estim: Cervical and Shoulder, IFC 80-150 hz, 15 mins, Pain and Tone Hot Pack: Cervical and Shoulder, 15 mins, Pain and Tone   PATIENT EDUCATION:  Education details: Wall climbs to improve left shoulder range of motion.   Person educated: Patient Education method: Medical illustrator Education comprehension: verbalized understanding  HOME EXERCISE PROGRAM: As above.    ASSESSMENT:  CLINICAL IMPRESSION: Pt arrives for today's treatment session reporting 3/10 left cervical pain.  Pt states that his left deltoid and bicep are feeling much better today.  Pt has MD appointment on Monday to get results from MRI.  STW/M performed to left cervical paraspinals and upper trap to decrease pain and tone.  Normal responses to estim and MH noted upon removal.  Pt reported decreased pain at completion of today's treatment session.   OBJECTIVE IMPAIRMENTS: decreased activity tolerance, decreased ROM, decreased strength, and pain.   ACTIVITY LIMITATIONS: carrying, lifting, and reach over head  PARTICIPATION LIMITATIONS: meal prep, cleaning, laundry, and yard work  [facility] POTENTIAL: Good minus  CLINICAL DECISION MAKING: Evolving/moderate complexity  EVALUATION COMPLEXITY: Moderate   GOALS: LONG TERM GOALS: Target date: 04/15/24  Ind with a HEP.  Goal status:  INITIAL  2.  Increase active cervical rotation to 60 degrees+ so patient can turn head more easily while driving.  Goal status: INITIAL  3.  Active left shoulder flexion to 135 degrees so the patient can easily reach overhead.  Goal status: INITIAL  4.  Eliminate left UE radiation of pain.  Goal status: INITIAL  5.  Perform ADL's with pain not > 3/10. Goal status: INITIAL   PLAN:  PT FREQUENCY: 2x/week  PT DURATION: 6 weeks  PLANNED INTERVENTIONS: 97110-Therapeutic exercises, 97530- Therapeutic activity, [id number]- Neuromuscular re-education, 97535- Self Care, 82956- Manual therapy, [id number]- Electrical stimulation (unattended), 97035- Ultrasound, 21308- Traction (mechanical), Cryotherapy, and Moist heat  PLAN FOR NEXT SESSION: Chin tucks and extension.  Pulleys, modalities and STW/M as needed.     Newman Pies, PTA 03/15/2024, 9:46 AM

## 2024-03-19 ENCOUNTER — Telehealth: Payer: Self-pay | Admitting: Cardiology

## 2024-03-19 NOTE — Telephone Encounter (Signed)
   Pre-operative Risk Assessment    Patient Name: Mitchell Parker  DOB: [birth year] MRN: [id number]      Request for Surgical Clearance    Procedure:   ACDF-C5-C6   Date of Surgery:  Clearance TBD                                 Surgeon:  Hoyt Koch, MD Surgeon's Group or Practice Name:  [facility] Phone number:  [phone] ext. [phone] Fax number:  [phone]   Type of Clearance Requested:   - Pharmacy:  Hold Clopidogrel (Plavix) 7 days before surgery   Type of Anesthesia:  General    Additional requests/questions:  Please fax a copy of Clearance  to the surgeon's office.  Signed, Delaine Lame   03/19/2024, 4:18 PM

## 2024-03-19 NOTE — Telephone Encounter (Signed)
 Dr. Diona Browner, patient's chart was reviewed for preoperative cardiac evaluation.  He was seen by you on 02/07/24. Could you please comment on cardiac risk, including agreement for patient to hold Plavix for 7 days prior to upcoming procedure (ACDF C5-6) given office visit was less than 2 months ago.    Please route your response to p cv div preop.  Thank you, Levi Aland, NP-C 03/19/2024, 4:42 PM

## 2024-03-20 ENCOUNTER — Encounter

## 2024-03-20 ENCOUNTER — Other Ambulatory Visit: Payer: Self-pay | Admitting: Family Medicine

## 2024-03-20 DIAGNOSIS — J301 Allergic rhinitis due to pollen: Secondary | ICD-10-CM

## 2024-03-20 MED ORDER — BETAMETHASONE SOD PHOS & ACET 6 (3-3) MG/ML IJ SUSP: 6.0000 mg | Freq: Once | INTRAMUSCULAR | Status: AC

## 2024-03-20 MED ORDER — FEXOFENADINE-PSEUDOEPHED ER 180-240 MG PO TB24: 1.0000 | ORAL_TABLET | Freq: Every evening | ORAL | 11 refills | Status: DC

## 2024-03-26 ENCOUNTER — Other Ambulatory Visit: Payer: Self-pay | Admitting: Neurosurgery

## 2024-03-27 ENCOUNTER — Other Ambulatory Visit: Payer: Self-pay

## 2024-03-27 ENCOUNTER — Encounter (HOSPITAL_COMMUNITY): Payer: Self-pay

## 2024-03-27 NOTE — Progress Notes (Signed)
 PCP - [name], MD  Cardiologist - Jonelle Sidle, MD   PPM/ICD - denies Device Orders - n/a Rep Notified - n/a  Chest x-ray - 02-02-23 EKG - 02-07-24 Stress Test - 2013 ECHO - 02-02-23 Cardiac Cath - per patient 10 + years ago  CPAP - uses nightly  GLP-1 -  Fasting Blood Sugar - per patient blood sugar ranges from 68-90  Checks Blood Sugar BID   Blood Thinner Instructions: denies Aspirin Instructions: n/a  ERAS Protcol - NPO  COVID TEST- n/a  Anesthesia review: yes  Patient verbally denies any shortness of breath, fever, cough and chest pain during phone call   -------------  SDW INSTRUCTIONS given:  Your procedure is scheduled on March 28, 2024.  Report to [facility] Main Entrance "A" at 7:30 A.M., and check in at the Admitting office.  Call this number if you have problems the morning of surgery:  [phone]   Remember:  Do not eat or drink after midnight the night before your surgery      Take these medicines the morning of surgery with A SIP OF WATER  acetaminophen (TYLENOL)  allopurinol (ZYLOPRIM)  atorvastatin (LIPITOR)  carvedilol (COREG)  DULoxetine (CYMBALTA)  cloNIDine (CATAPRES)  ALLEGRA-D 24  HYDROcodone-acetaminophen (NORCO/VICODIN)  hydroxychloroquine (PLAQUENIL)   As of today, STOP taking any Aspirin (unless otherwise instructed by your surgeon) Aleve, Naproxen, Ibuprofen, Motrin, Advil, Goody's, BC's, all herbal medications, fish oil, and all vitamins.   WHAT DO I DO ABOUT MY DIABETES MEDICATION?   Do not take oral diabetes medicines pioglitazone (ACTOS) glimepiride (AMARYL)  the morning of surgery.   The day of surgery, do not take other diabetes injectables, including Byetta (exenatide), Bydureon (exenatide ER), Victoza (liraglutide), or Trulicity (dulaglutide).  If your CBG is greater than 220 mg/dL, you may take  of your sliding scale (correction) dose of insulin.   HOW TO MANAGE YOUR DIABETES BEFORE AND AFTER  SURGERY  Why is it important to control my blood sugar before and after surgery? Improving blood sugar levels before and after surgery helps healing and can limit problems. A way of improving blood sugar control is eating a healthy diet by:  Eating less sugar and carbohydrates  Increasing activity/exercise  Talking with your doctor about reaching your blood sugar goals High blood sugars (greater than 180 mg/dL) can raise your risk of infections and slow your recovery, so you will need to focus on controlling your diabetes during the weeks before surgery. Make sure that the doctor who takes care of your diabetes knows about your planned surgery including the date and location.  How do I manage my blood sugar before surgery? Check your blood sugar at least 4 times a day, starting 2 days before surgery, to make sure that the level is not too high or low.  Check your blood sugar the morning of your surgery when you wake up and every 2 hours until you get to the Short Stay unit.  If your blood sugar is less than 70 mg/dL, you will need to treat for low blood sugar: Do not take insulin. Treat a low blood sugar (less than 70 mg/dL) with  cup of clear juice (cranberry or apple), 4 glucose tablets, OR glucose gel. Recheck blood sugar in 15 minutes after treatment (to make sure it is greater than 70 mg/dL). If your blood sugar is not greater than 70 mg/dL on recheck, call [phone] for further instructions. Report your blood sugar to the short stay nurse  when you get to Short Stay.  If you are admitted to the hospital after surgery: Your blood sugar will be checked by the staff and you will probably be given insulin after surgery (instead of oral diabetes medicines) to make sure you have good blood sugar levels. The goal for blood sugar control after surgery is 80-180 mg/dL.                      Do not wear jewelry, make up, or nail polish            Do not wear lotions, powders,  perfumes/colognes, or deodorant.            Do not shave 48 hours prior to surgery.  Men may shave face and neck.            Do not bring valuables to the hospital.            [facility] is not responsible for any belongings or valuables.  Do NOT Smoke (Tobacco/Vaping) 24 hours prior to your procedure If you use a CPAP at night, you may bring all equipment for your overnight stay.   Contacts, glasses, dentures or bridgework may not be worn into surgery.      For patients admitted to the hospital, discharge time will be determined by your treatment team.   Patients discharged the day of surgery will not be allowed to drive home, and someone needs to stay with them for 24 hours.    Special instructions:   - Preparing For Surgery  Before surgery, you can play an important role. Because skin is not sterile, your skin needs to be as free of germs as possible. You can reduce the number of germs on your skin by washing with CHG (chlorahexidine gluconate) Soap before surgery.  CHG is an antiseptic cleaner which kills germs and bonds with the skin to continue killing germs even after washing.    Oral Hygiene is also important to reduce your risk of infection.  Remember - BRUSH YOUR TEETH THE MORNING OF SURGERY WITH YOUR REGULAR TOOTHPASTE  Please do not use if you have an allergy to CHG or antibacterial soaps. If your skin becomes reddened/irritated stop using the CHG.  Do not shave (including legs and underarms) for at least 48 hours prior to first CHG shower. It is OK to shave your face.  Please follow these instructions carefully.   Shower the NIGHT BEFORE SURGERY and the MORNING OF SURGERY with DIAL Soap.   Pat yourself dry with a CLEAN TOWEL.  Wear CLEAN PAJAMAS to bed the night before surgery  Place CLEAN SHEETS on your bed the night of your first shower and DO NOT SLEEP WITH PETS.   Day of Surgery: Please shower morning of surgery  Wear Clean/Comfortable clothing the  morning of surgery Do not apply any deodorants/lotions.   Remember to brush your teeth WITH YOUR REGULAR TOOTHPASTE.   Questions were answered. Patient verbalized understanding of instructions.

## 2024-03-27 NOTE — Anesthesia Preprocedure Evaluation (Signed)
 Anesthesia Evaluation  Patient identified by MRN, date of birth, ID band Patient awake    Reviewed: Allergy & Precautions, NPO status , Patient's Chart, lab work & pertinent test results, reviewed documented beta blocker date and time   Airway Mallampati: III  TM Distance: >3 FB Neck ROM: Limited    Dental  (+) Teeth Intact, Dental Advisory Given   Pulmonary sleep apnea , COPD, former smoker   Pulmonary exam normal breath sounds clear to auscultation       Cardiovascular hypertension, Pt. on medications and Pt. on home beta blockers +CHF and + DVT  + dysrhythmias (s/p Watchman) Atrial Fibrillation  Rhythm:Irregular Rate:Abnormal   HFrecED with LVEF 55-60% by TEE in 01/2023   Neuro/Psych traumatic SDH/SAH (awoke on floor in garage 02/03/22, increase right SDH after fall from chair 03/2022, s/p right burr holes/subdural drain 05/04/22)  Neuromuscular disease    GI/Hepatic negative GI ROS, Neg liver ROS,,,  Endo/Other  diabetes, Type 2, Oral Hypoglycemic Agents  Class 3 obesity  Renal/GU Renal InsufficiencyRenal disease     Musculoskeletal  (+) Arthritis , Rheumatoid disorders,  Fibromyalgia -  Abdominal   Peds  Hematology negative hematology ROS (+)   Anesthesia Other Findings Day of surgery medications reviewed with the patient.  Reproductive/Obstetrics                              Anesthesia Physical Anesthesia Plan  ASA: 3  Anesthesia Plan: General   Post-op Pain Management: Tylenol PO (pre-op)*   Induction: Intravenous  PONV Risk Score and Plan: 2 and Dexamethasone and Ondansetron  Airway Management Planned: Video Laryngoscope Planned and Oral ETT  Additional Equipment:   Intra-op Plan:   Post-operative Plan: Extubation in OR  Informed Consent: I have reviewed the patients History and Physical, chart, labs and discussed the procedure including the risks, benefits and alternatives  for the proposed anesthesia with the patient or authorized representative who has indicated his/her understanding and acceptance.     Dental advisory given  Plan Discussed with: CRNA  Anesthesia Plan Comments: (PAT note written 03/27/2024 by Shonna Chock, PA-C.  )        Anesthesia Quick Evaluation

## 2024-03-27 NOTE — Progress Notes (Signed)
 Anesthesia Chart Review: Mitchell Parker  Case: [id number] Date/Time: 03/28/24 0915   Procedure: ANTERIOR CERVICAL DECOMPRESSION/DISCECTOMY FUSION 1 LEVEL (Left) - ACDF C56   Anesthesia type: General   Diagnosis: Left cervical radiculopathy [M54.12]   Pre-op diagnosis: LEFT CERVICAL RADICULOPATHY   [facility] / [facility]   Surgeons: Bedelia Person, MD       DISCUSSION: Patient is a 73 year old male scheduled for the above procedure.  History includes former smoker (quit 12/26/94), COPD, OSA (uses CPAP), non-ischemic cardiomyopathy (2003), HFrecEF, PSVT/aflutter (02/2022 Zio, s/p LAAO with Watchman device 02/02/23 ), HTN (and orthostatic hypotension), CKD, DM2, RA, gout, osteomyelitis (right great toe amputation 10/07/20), vasovagal syncope, chronic diarrhea, DVT (BLE DVT 04/19/12), varicose veins (s/p surgery), obesity (bariatric surgery 01/11/18), traumatic SDH/SAH (awoke on floor in garage 02/03/22, increase right SDH after fall from chair 03/2022, s/p right burr holes/subdural drain 05/04/22), cholecystectomy (01/18/12), right arm abscess (possibly r/t spider bite, s/p operative I&D 08/12/23), left groin abscess (.stick injury, s/p bedside I&D 02/07/24). 2 Possible small PFO with mild left to right shunt by 03/08/22 echo.    Last cardiology visit with Dr. Diona Browner was on 02/07/24. He was doing well post Watchman implantation (poor candidate for anticoagulation due to SDH/SAH). Maintaining SR on ECG and no longer on anticoagulation and antiplatelet therapy. HFrecED with LVEF 55-60% by TEE in 01/2023. HTN and DM2 controlled. Coronary calcium score 4417 with multivessel coronary atherosclerosis by cardiac CT in January 2024.  Previous cardiac catheterization in 2003 showed minor coronary atherosclerosis.  No angina or change in ECG.  Continue Lipitor, last LDL 40. Six month follow-up planned.   On 03/20/24, Dr. Diona Browner wrote (Telephone encounter), "Chart reviewed including recent office note.  Patient  was clinically stable as of his visit in February.  RCRI perioperative cardiac risk is 2 points, 10.1% chance of major adverse cardiac event (intermediate risk overall).  He should be able to proceed with spine surgery under general anesthesia without further cardiac testing at this time, presuming he has not developed any interval cardiac symptoms.  May hold Plavix 7 days prior to operation." (He is no longer on Plavix per medication list.)  Notes suggest he is being considered for Ozempic, although not listed on his medication list as of 03/26/24. He is on Amaryl 2 mg and Actos 30 mg. A1c 5.4% on 01/18/24.   He is on plaquenil for RA.   He is a same day work-up. Labs as indicated and anesthesia team evaluation on the day of surgery.      VS:  Wt Readings from Last 3 Encounters:  02/23/24 (!) 153.9 kg  02/07/24 (!) 154 kg  02/07/24 (!) 153.8 kg   BP Readings from Last 3 Encounters:  02/28/24 (!) 125/53  02/23/24 131/71  02/22/24 (!) 141/66   Pulse Readings from Last 3 Encounters:  02/28/24 (!) 45  02/23/24 (!) 43  02/22/24 (!) 58     PROVIDERS: [name], MD is PCP (non-[facility]), Jana Hakim, MD [facility]) [name], MD is cardiologist  Doreatha Massed, MD is HEM (evaluation 01/13/23 for mild thrombocytopenia and splenomegaly, fatty liver on [place] 2023; IV Feraheme for mild iron deficiency, for colonoscopy through [facility], done 12/12/23 with repeat in 5 years recommended) - OSA followed at the [facility]. 82% CPAP compliance as of 11/15/23.   LABS: For day of surgery as indicated. Labs trends since 10/2023 show mild elevation of AST and ALT with PLT count 103-159K. Most recent results  in [facility] include: Lab Results  Component Value Date   WBC 7.3 02/16/2024   HGB 13.5 02/16/2024   HCT 39.8 02/16/2024   PLT 159 02/16/2024   GLUCOSE 136 (H) 02/16/2024   ALT 56 (H) 02/16/2024   AST 47 (H) 02/16/2024   NA 137 02/16/2024   K 4.6 02/16/2024   CL 101 02/16/2024    CREATININE 1.15 02/16/2024   BUN 40 (H) 02/16/2024   CO2 26 02/16/2024   HGBA1C 5.4 01/18/2024     IMAGES: CT C-spine 02/07/24: IMPRESSION: 1. No acute cervical spine fracture. 2. Left predominant spondylosis and facet hypertrophy at C3-4, resulting in moderate left neural foraminal encroachment. This has progressed since the prior exam.  CT Pelvis 02/07/24: IMPRESSION: 1. Skin thickening and subcutaneous infiltration and small fluid in the subcutaneous fat at the left groin consistent with cellulitis. Cutaneous/subcutaneous slightly more focal fluid collection measuring 16 x 14 mm could reflect inflammatory fluid or developing/small abscess. 2. Mildly enlarged left inguinal nodes, likely reactive. 3. Diverticular disease of the left colon without acute inflammatory process.    EKG: 02/07/24: Sinus bradycardia at 51 bpm with 1st degree A-V block Non-specific intra-ventricular conduction delay Confirmed by [name] [phone]) on 02/07/2024 8:27:16 AM   CV: CT Cardiac Morph 04/06/23: IMPRESSION: 1. A 27 mm Watchman FLX device has been implanted. It is well seated in the left atrial appendage with no peridevice leak. - Previous CT Cardiac findings (pre-Watchman evaluation) on 01/05/23 also showed  Normal coronary origin. Right dominance. Coronary artery calcium score is 4417, which is 98th percentile for age and sex matched peers. 3 vessel coronary artery calcifications including LM.   TEE (for Watchman device) 02/02/23: IMPRESSIONS   1. TEE guided left atrial appendage closure with 27 mm Watchman FLX.  Compression 25-29%. No device leak. Small iatrogenic ASD remained after  the case with L to R shunting. Trivial pericardial effusion before and  after the case.   2. Left ventricular ejection fraction, by estimation, is 55 to 60%. The  left ventricle has normal function. The left ventricle has no regional  wall motion abnormalities.   3. Right ventricular systolic  function is normal. The right ventricular  size is normal.   4. No left atrial/left atrial appendage thrombus was detected.   5. The mitral valve is grossly normal. Trivial mitral valve  regurgitation. No evidence of mitral stenosis.   6. The aortic valve is tricuspid. Aortic valve regurgitation is trivial.  No aortic stenosis is present.   7. There is mild (Grade II) layered plaque involving the descending  aorta.    TTE 03/08/22: IMPRESSIONS   1. Left ventricular ejection fraction, by estimation, is 60 to 65%. The  left ventricle has normal function. Left ventricular endocardial border  not optimally defined to evaluate regional wall motion. There is mild left  ventricular hypertrophy. Left  ventricular diastolic parameters were normal.   2. Right ventricular systolic function is normal. The right ventricular  size is normal. Tricuspid regurgitation signal is inadequate for assessing  PA pressure.   3. The mitral valve is normal in structure. No evidence of mitral valve  regurgitation. No evidence of mitral stenosis.   4. The aortic valve was not well visualized. Aortic valve regurgitation  is not visualized. No aortic stenosis is present.   5. Aortic dilatation noted. There is mild dilatation of the ascending  aorta, measuring 38 mm.   6. The inferior vena cava is normal in size with greater than 50%  respiratory variability, suggesting right atrial pressure of 3 mmHg.   7. Possible small PFO with mild left to right shunt.  - Comparison LVEF: 60-65% 04/06/21; 50-55% with diffuse LV hypokinesis 05/07/18; 60-65% 04/19/12; LVEF 17% with LV global hypokinesis 10/02/02)     Long term event monitor ZioXT 02/23/22-03/01/22: ZIO XT reviewed.  5 days, 9 hours analyzed. - Predominant rhythm is sinus with heart rate ranging from 48 bpm up to 109 bpm and average heart rate 60 bpm. - There were rare PACs including atrial couplets and triplets representing less than 1% total beats.  There were rare  PVCs including ventricular couplets representing less than 1% total beats.  Ventricular bigeminy was also noted. - Episodes of SVT were noted, more specifically atrial flutter with variable conduction was noted approximately 17% of the time.  Average heart rate was in the 70s.  Longest episode lasted for 22 hours and 33 minutes. - There were no pauses.     Nuclear stress test 12/11/12: Overall Impression:   Low risk stress nuclear study without evidence for ischemia. LV Wall Motion:  Mild global LV dysfunction with EF 42%.     US Carotid 06/06/19: IMPRESSION: - No significant stenosis in either internal carotid artery. - Vertebral arteries are patent with normal antegrade flow.     RHC/LHC 10/03/02: FINDINGS:  1. Hemodynamics:  RA 19/15/13, RV 46/10/16, PA 46/20/28, PCW 27/26/20, LV     153/15/28, cardiac output 6.8, cardiac index 2.6, SBR 12/12.  2. No aortic stenosis on pullback.  3. Mitral regurgitation 1+.  4. Dilated left ventricle with global hypokinesis, ejection fraction 17%.  5. Left main:  Angiographically normal.  6. LAD:  The LAD is a large vessel giving rise to two large diagonal     branches.  The first diagonal has a 40% stenosis proximally. The distal     LAD has a 20% stenosis.  7. Circumflex:  Large vessel giving rise to two obtuse marginal branches.     It is angiographically normal.  8. RCA:  Moderate sized vessel.  There is a 30% stenosis of the mid vessel.   IMPRESSION/PLAN:  The patient has a nonischemic cardiomyopathy with severely  impaired LV systolic function.  Left ventricular pressures as well as right  atrial pressures remain markedly elevated with preserved cardiac output.  Blood pressure remains hypertensive. Will increase ACE inhibitor and  continue aggressive diuresis. In addition, we will begin systemic  anticoagulation with Coumadin.   Past Medical History:  Diagnosis Date   Chronic diarrhea    CKD (chronic kidney disease) stage 3, GFR  30-59 ml/min (HCC)    COPD (chronic obstructive pulmonary disease) (HCC)    Dysrhythmia 02/2022   paroxsymal SVT & aflutter 02/2022 XioXT   Essential hypertension    Fibromyalgia    Gallstones    Gout    History of CHF (congestive heart failure)    History of colonic polyps    History of DVT (deep vein thrombosis) 04/19/2012   BLE DVT 04/09/12   Iron deficiency 02/16/2023   Kidney stones    Lumbar disc disease    Neuropathy    Nonischemic cardiomyopathy (HCC) 2003   Minor coronary atherosclerosis at cardiac catheterization 2003, LVEF initially 20% with normalization   Obesity    Orthostatic hypotension    Presence of Watchman left atrial appendage closure device 02/02/2023   27mm Watchman placed by Dr. Excell Seltzer   RA (rheumatoid arthritis) [facility])    Sleep apnea    Subarachnoid  hemorrhage (HCC) 02/03/2022   Traumatic   Subdural hematoma (HCC) 02/03/2022   Traumatic   Syncope, vasovagal    Type 2 diabetes mellitus [facility])     Past Surgical History:  Procedure Laterality Date   BARIATRIC SURGERY  01/11/2018   BURR HOLE Right 05/04/2022   Procedure: BURR HOLES FOR Subdural Hematoma;  Surgeon: Bedelia Person, MD;  Location: [facility] OR;  Service: Neurosurgery;  Laterality: Right;   CHOLECYSTECTOMY  01/18/2012   Procedure: LAPAROSCOPIC CHOLECYSTECTOMY WITH INTRAOPERATIVE CHOLANGIOGRAM;  Surgeon: Clovis Pu. Cornett, MD;  Location: [facility] ORS;  Service: General;  Laterality: N/A;   COLONOSCOPY  05/2017   [facility]: five polyps removed but only 3 retrieved due to bowel prep, pathology with tubular adenomas   EYE SURGERY Right    Film over retina was removed.   I & D EXTREMITY Right 08/12/2023   Procedure: IRRIGATION AND DEBRIDEMENT RIGHT FOREARM;  Surgeon: Ramon Dredge, MD;  Location: [facility] OR;  Service: Orthopedics;  Laterality: Right;   LEFT ATRIAL APPENDAGE OCCLUSION N/A 02/02/2023   Procedure: LEFT ATRIAL APPENDAGE OCCLUSION;  Surgeon: Tonny Bollman, MD;  Location:  [facility] INVASIVE CV LAB;  Service: Cardiovascular;  Laterality: N/A;   PILONIDAL CYST EXCISION  1997   TEE WITHOUT CARDIOVERSION N/A 02/02/2023   Procedure: TRANSESOPHAGEAL ECHOCARDIOGRAM;  Surgeon: Tonny Bollman, MD;  Location: [facility] INVASIVE CV LAB;  Service: Cardiovascular;  Laterality: N/A;   TOE AMPUTATION Right    3 toes on right foot- big toe and next 2   UMBILICAL HERNIA REPAIR     VARICOSE VEIN SURGERY     left leg   watchman implant      MEDICATIONS:  betamethasone acetate-betamethasone sodium phosphate (CELESTONE) injection 6 mg    acetaminophen (TYLENOL) 500 MG tablet   allopurinol (ZYLOPRIM) 300 MG tablet   amLODipine-olmesartan (AZOR) 10-40 MG tablet   atorvastatin (LIPITOR) 80 MG tablet   calcitRIOL (ROCALTROL) 0.25 MCG capsule   calcium carbonate (OSCAL) 1500 (600 Ca) MG TABS tablet   carvedilol (COREG) 6.25 MG tablet   celecoxib (CELEBREX) 200 MG capsule   cloNIDine (CATAPRES) 0.2 MG tablet   DULoxetine (CYMBALTA) 30 MG capsule   ferrous sulfate 325 (65 FE) MG tablet   furosemide (LASIX) 40 MG tablet   glimepiride (AMARYL) 2 MG tablet   HYDROcodone-acetaminophen (NORCO/VICODIN) 5-325 MG tablet   hydroxychloroquine (PLAQUENIL) 200 MG tablet   Multiple Vitamin (MULTIVITAMIN WITH MINERALS) TABS tablet   pioglitazone (ACTOS) 30 MG tablet   potassium chloride SA (KLOR-CON M) 20 MEQ tablet   silver sulfADIAZINE (SILVADENE) 1 % cream   tamsulosin (FLOMAX) 0.4 MG CAPS capsule   vitamin B-12 (CYANOCOBALAMIN) 500 MCG tablet   fexofenadine-pseudoephedrine (ALLEGRA-D 24) 180-240 MG 24 hr tablet   pregabalin (LYRICA) 300 MG capsule    Shonna Chock, PA-C Surgical Short Stay/Anesthesiology [facility] Phone [phone] [facility] Phone [phone] 03/27/2024 11:34 AM

## 2024-03-28 ENCOUNTER — Ambulatory Visit (HOSPITAL_BASED_OUTPATIENT_CLINIC_OR_DEPARTMENT_OTHER): Admitting: Vascular Surgery

## 2024-03-28 ENCOUNTER — Encounter (HOSPITAL_COMMUNITY)
Admission: RE | Disposition: A | Discharge status: Home / Self Care | Payer: Self-pay | Source: Home / Self Care | Attending: Neurosurgery

## 2024-03-28 ENCOUNTER — Encounter (HOSPITAL_COMMUNITY): Payer: Self-pay | Admitting: *Deleted

## 2024-03-28 ENCOUNTER — Ambulatory Visit (HOSPITAL_COMMUNITY)

## 2024-03-28 ENCOUNTER — Ambulatory Visit (HOSPITAL_COMMUNITY)
Admission: RE | Admit: 2024-03-28 | Discharge: 2024-03-28 | Disposition: A | Discharge status: Home / Self Care | Attending: Neurosurgery | Admitting: Neurosurgery

## 2024-03-28 ENCOUNTER — Other Ambulatory Visit: Payer: Self-pay

## 2024-03-28 ENCOUNTER — Ambulatory Visit (HOSPITAL_COMMUNITY): Admitting: Vascular Surgery

## 2024-03-28 DIAGNOSIS — E66813 Obesity, class 3: Secondary | ICD-10-CM | POA: Insufficient documentation

## 2024-03-28 DIAGNOSIS — M797 Fibromyalgia: Secondary | ICD-10-CM | POA: Diagnosis not present

## 2024-03-28 DIAGNOSIS — I5032 Chronic diastolic (congestive) heart failure: Secondary | ICD-10-CM | POA: Insufficient documentation

## 2024-03-28 DIAGNOSIS — G4730 Sleep apnea, unspecified: Secondary | ICD-10-CM | POA: Insufficient documentation

## 2024-03-28 DIAGNOSIS — Z6841 Body Mass Index (BMI) 40.0 and over, adult: Secondary | ICD-10-CM | POA: Insufficient documentation

## 2024-03-28 DIAGNOSIS — G9589 Other specified diseases of spinal cord: Secondary | ICD-10-CM | POA: Insufficient documentation

## 2024-03-28 DIAGNOSIS — Z86718 Personal history of other venous thrombosis and embolism: Secondary | ICD-10-CM | POA: Insufficient documentation

## 2024-03-28 DIAGNOSIS — M109 Gout, unspecified: Secondary | ICD-10-CM | POA: Insufficient documentation

## 2024-03-28 DIAGNOSIS — E119 Type 2 diabetes mellitus without complications: Secondary | ICD-10-CM | POA: Insufficient documentation

## 2024-03-28 DIAGNOSIS — J449 Chronic obstructive pulmonary disease, unspecified: Secondary | ICD-10-CM

## 2024-03-28 DIAGNOSIS — N1832 Chronic kidney disease, stage 3b: Secondary | ICD-10-CM | POA: Diagnosis not present

## 2024-03-28 DIAGNOSIS — I110 Hypertensive heart disease with heart failure: Secondary | ICD-10-CM | POA: Diagnosis not present

## 2024-03-28 DIAGNOSIS — Z01818 Encounter for other preprocedural examination: Secondary | ICD-10-CM

## 2024-03-28 DIAGNOSIS — Z87891 Personal history of nicotine dependence: Secondary | ICD-10-CM | POA: Insufficient documentation

## 2024-03-28 DIAGNOSIS — I10 Essential (primary) hypertension: Secondary | ICD-10-CM

## 2024-03-28 DIAGNOSIS — M4802 Spinal stenosis, cervical region: Secondary | ICD-10-CM | POA: Insufficient documentation

## 2024-03-28 DIAGNOSIS — Z7984 Long term (current) use of oral hypoglycemic drugs: Secondary | ICD-10-CM | POA: Insufficient documentation

## 2024-03-28 DIAGNOSIS — M5412 Radiculopathy, cervical region: Secondary | ICD-10-CM

## 2024-03-28 DIAGNOSIS — I4891 Unspecified atrial fibrillation: Secondary | ICD-10-CM | POA: Insufficient documentation

## 2024-03-28 DIAGNOSIS — I428 Other cardiomyopathies: Secondary | ICD-10-CM | POA: Diagnosis not present

## 2024-03-28 DIAGNOSIS — I509 Heart failure, unspecified: Secondary | ICD-10-CM

## 2024-03-28 DIAGNOSIS — M069 Rheumatoid arthritis, unspecified: Secondary | ICD-10-CM | POA: Diagnosis not present

## 2024-03-28 DIAGNOSIS — D649 Anemia, unspecified: Secondary | ICD-10-CM

## 2024-03-28 DIAGNOSIS — I130 Hypertensive heart and chronic kidney disease with heart failure and stage 1 through stage 4 chronic kidney disease, or unspecified chronic kidney disease: Secondary | ICD-10-CM | POA: Diagnosis not present

## 2024-03-28 HISTORY — PX: ANTERIOR CERVICAL DECOMP/DISCECTOMY FUSION: SHX1161

## 2024-03-28 LAB — CBC
HCT: 42.3 % (ref 39.0–52.0)
Hemoglobin: 14.1 g/dL (ref 13.0–17.0)
MCH: 32.4 pg (ref 26.0–34.0)
MCHC: 33.3 g/dL (ref 30.0–36.0)
MCV: 97.2 fL (ref 80.0–100.0)
Platelets: 89 10*3/uL — ABNORMAL LOW (ref 150–400)
RBC: 4.35 MIL/uL (ref 4.22–5.81)
RDW: 13.3 % (ref 11.5–15.5)
WBC: 4 10*3/uL (ref 4.0–10.5)
nRBC: 0 % (ref 0.0–0.2)

## 2024-03-28 LAB — GLUCOSE, CAPILLARY
Glucose-Capillary: 110 mg/dL — ABNORMAL HIGH (ref 70–99)
Glucose-Capillary: 95 mg/dL (ref 70–99)
Glucose-Capillary: 128 mg/dL — ABNORMAL HIGH (ref 70–99)

## 2024-03-28 LAB — SURGICAL PCR SCREEN
MRSA, PCR: POSITIVE — AB
Staphylococcus aureus: POSITIVE — AB

## 2024-03-28 LAB — BASIC METABOLIC PANEL WITH GFR
Anion gap: 9 (ref 5–15)
BUN: 29 mg/dL — ABNORMAL HIGH (ref 8–23)
CO2: 22 mmol/L (ref 22–32)
Calcium: 9.3 mg/dL (ref 8.9–10.3)
Chloride: 108 mmol/L (ref 98–111)
Creatinine, Ser: 0.98 mg/dL (ref 0.61–1.24)
GFR, Estimated: >60 mL/min (ref >60)
Glucose, Bld: 101 mg/dL — ABNORMAL HIGH (ref 70–99)
Potassium: 4.2 mmol/L (ref 3.5–5.1)
Sodium: 139 mmol/L (ref 135–145)

## 2024-03-28 LAB — TYPE AND SCREEN
ABO/RH(D): A NEG
Antibody Screen: NEGATIVE

## 2024-03-28 SURGERY — ANTERIOR CERVICAL DECOMPRESSION/DISCECTOMY FUSION 1 LEVEL
Anesthesia: General | Laterality: Left

## 2024-03-28 MED ORDER — FENTANYL CITRATE (PF) 250 MCG/5ML IJ SOLN
INTRAMUSCULAR | Status: AC
  Filled 2024-03-28: qty 5

## 2024-03-28 MED ORDER — SUGAMMADEX SODIUM 200 MG/2ML IV SOLN
INTRAVENOUS | Status: DC | PRN
Start: 2024-03-28 — End: 2024-03-28
  Administered 2024-03-28: 200 mg via INTRAVENOUS

## 2024-03-28 MED ORDER — 0.9 % SODIUM CHLORIDE (POUR BTL) OPTIME
TOPICAL | Status: DC | PRN
  Administered 2024-03-28: 1000 mL

## 2024-03-28 MED ORDER — PROPOFOL 10 MG/ML IV BOLUS
INTRAVENOUS | Status: DC | PRN
  Administered 2024-03-28: 140 mg via INTRAVENOUS

## 2024-03-28 MED ORDER — CEFAZOLIN SODIUM-DEXTROSE 3-4 GM/150ML-% IV SOLN
3.0000 g | INTRAVENOUS | Status: AC
  Administered 2024-03-28: 3 g via INTRAVENOUS
  Filled 2024-03-28: qty 150

## 2024-03-28 MED ORDER — THROMBIN 5000 UNITS EX SOLR: OROMUCOSAL | Status: DC | PRN

## 2024-03-28 MED ORDER — DOCUSATE SODIUM 100 MG PO CAPS: 100.0000 mg | ORAL_CAPSULE | Freq: Two times a day (BID) | ORAL | 2 refills | Status: DC | PRN

## 2024-03-28 MED ORDER — ROCURONIUM BROMIDE 10 MG/ML (PF) SYRINGE
PREFILLED_SYRINGE | INTRAVENOUS | Status: AC
  Filled 2024-03-28: qty 10

## 2024-03-28 MED ORDER — PROPOFOL 10 MG/ML IV BOLUS
INTRAVENOUS | Status: AC
  Filled 2024-03-28: qty 20

## 2024-03-28 MED ORDER — LIDOCAINE 2% (20 MG/ML) 5 ML SYRINGE
INTRAMUSCULAR | Status: DC | PRN
  Administered 2024-03-28: 100 mg via INTRAVENOUS

## 2024-03-28 MED ORDER — LIDOCAINE 2% (20 MG/ML) 5 ML SYRINGE
INTRAMUSCULAR | Status: AC
  Filled 2024-03-28: qty 5

## 2024-03-28 MED ORDER — OXYCODONE-ACETAMINOPHEN 5-325 MG PO TABS: 1.0000 | ORAL_TABLET | ORAL | 0 refills | Status: DC | PRN

## 2024-03-28 MED ORDER — MIDAZOLAM HCL 2 MG/2ML IJ SOLN
INTRAMUSCULAR | Status: AC
  Filled 2024-03-28: qty 2

## 2024-03-28 MED ORDER — ONDANSETRON HCL 4 MG/2ML IJ SOLN
INTRAMUSCULAR | Status: DC | PRN
  Administered 2024-03-28: 4 mg via INTRAVENOUS

## 2024-03-28 MED ORDER — CARVEDILOL 3.125 MG PO TABS
ORAL_TABLET | ORAL | Status: AC
  Filled 2024-03-28: qty 1

## 2024-03-28 MED ORDER — MIDAZOLAM HCL 2 MG/2ML IJ SOLN
INTRAMUSCULAR | Status: DC | PRN
  Administered 2024-03-28: 2 mg via INTRAVENOUS

## 2024-03-28 MED ORDER — CHLORHEXIDINE GLUCONATE 0.12 % MT SOLN
15.0000 mL | Freq: Once | OROMUCOSAL | Status: AC
  Filled 2024-03-28: qty 15

## 2024-03-28 MED ORDER — PHENYLEPHRINE 80 MCG/ML (10ML) SYRINGE FOR IV PUSH (FOR BLOOD PRESSURE SUPPORT)
PREFILLED_SYRINGE | INTRAVENOUS | Status: AC
  Filled 2024-03-28: qty 10

## 2024-03-28 MED ORDER — FENTANYL CITRATE (PF) 100 MCG/2ML IJ SOLN: 25.0000 ug | INTRAMUSCULAR | Status: DC | PRN

## 2024-03-28 MED ORDER — LIDOCAINE-EPINEPHRINE 1 %-1:100000 IJ SOLN
INTRAMUSCULAR | Status: AC
  Filled 2024-03-28: qty 1

## 2024-03-28 MED ORDER — THROMBIN 5000 UNITS EX KIT
PACK | CUTANEOUS | Status: AC
  Filled 2024-03-28: qty 1

## 2024-03-28 MED ORDER — PHENYLEPHRINE HCL-NACL 20-0.9 MG/250ML-% IV SOLN
INTRAVENOUS | Status: DC | PRN
  Administered 2024-03-28: 30 ug/min via INTRAVENOUS

## 2024-03-28 MED ORDER — DEXAMETHASONE SODIUM PHOSPHATE 10 MG/ML IJ SOLN
INTRAMUSCULAR | Status: DC | PRN
  Administered 2024-03-28: 10 mg via INTRAVENOUS

## 2024-03-28 MED ORDER — ROCURONIUM BROMIDE 10 MG/ML (PF) SYRINGE
PREFILLED_SYRINGE | INTRAVENOUS | Status: DC | PRN
  Administered 2024-03-28: 20 mg via INTRAVENOUS
  Administered 2024-03-28 (×2): 10 mg via INTRAVENOUS
  Administered 2024-03-28: 60 mg via INTRAVENOUS
  Administered 2024-03-28: 10 mg via INTRAVENOUS

## 2024-03-28 MED ORDER — LIDOCAINE-EPINEPHRINE 1 %-1:100000 IJ SOLN
INTRAMUSCULAR | Status: DC | PRN
  Administered 2024-03-28: 4 mL

## 2024-03-28 MED ORDER — CHLORHEXIDINE GLUCONATE CLOTH 2 % EX PADS
6.0000 | MEDICATED_PAD | Freq: Once | CUTANEOUS | Status: DC
Start: 2024-03-28 — End: 2024-03-28

## 2024-03-28 MED ORDER — FENTANYL CITRATE (PF) 250 MCG/5ML IJ SOLN
INTRAMUSCULAR | Status: DC | PRN
  Administered 2024-03-28: 100 ug via INTRAVENOUS

## 2024-03-28 MED ORDER — ONDANSETRON HCL 4 MG/2ML IJ SOLN: 4.0000 mg | Freq: Once | INTRAMUSCULAR | Status: DC | PRN

## 2024-03-28 MED ORDER — ACETAMINOPHEN 500 MG PO TABS
1000.0000 mg | ORAL_TABLET | Freq: Once | ORAL | Status: AC
  Administered 2024-03-28: 1000 mg via ORAL
  Filled 2024-03-28: qty 2

## 2024-03-28 MED ORDER — CYCLOBENZAPRINE HCL 5 MG PO TABS
5.0000 mg | ORAL_TABLET | Freq: Three times a day (TID) | ORAL | 0 refills | Status: DC | PRN
Start: 2024-03-28 — End: 2024-07-29

## 2024-03-28 MED ORDER — LACTATED RINGERS IV SOLN: INTRAVENOUS | Status: DC

## 2024-03-28 MED ORDER — ORAL CARE MOUTH RINSE
15.0000 mL | Freq: Once | OROMUCOSAL | Status: AC
  Administered 2024-03-28: 15 mL via OROMUCOSAL

## 2024-03-28 MED ORDER — CARVEDILOL 3.125 MG PO TABS
3.1250 mg | ORAL_TABLET | Freq: Once | ORAL | Status: AC
  Administered 2024-03-28: 3.125 mg via ORAL

## 2024-03-28 MED ORDER — CHLORHEXIDINE GLUCONATE CLOTH 2 % EX PADS: 6.0000 | MEDICATED_PAD | Freq: Once | CUTANEOUS | Status: DC

## 2024-03-28 SURGICAL SUPPLY — 57 items
BAG COUNTER SPONGE SURGICOUNT (BAG) ×1 IMPLANT
BAND RUBBER #18 3X1/16 STRL (MISCELLANEOUS) ×2 IMPLANT
BASKET BONE COLLECTION (BASKET) IMPLANT
BENZOIN TINCTURE PRP APPL 2/3 (GAUZE/BANDAGES/DRESSINGS) ×1 IMPLANT
BIT DRILL NEURO 2X3.1 SFT TUCH (MISCELLANEOUS) ×1 IMPLANT
BLADE CLIPPER SURG (BLADE) IMPLANT
BUR MATCHSTICK NEURO 3.0 LAGG (BURR) ×1 IMPLANT
CANISTER SUCT 3000ML PPV (MISCELLANEOUS) ×1 IMPLANT
DEVICE ENDSKLTN IMPL 16X14X7X6 (Cage) IMPLANT
DRAPE C-ARM 42X72 X-RAY (DRAPES) ×2 IMPLANT
DRAPE LAPAROTOMY 100X72 PEDS (DRAPES) ×1 IMPLANT
DRAPE MICROSCOPE SLANT 54X150 (MISCELLANEOUS) ×1 IMPLANT
DRAPE SHEET LG 3/4 BI-LAMINATE (DRAPES) ×1 IMPLANT
DRESSING MEPILEX FLEX 4X4 (GAUZE/BANDAGES/DRESSINGS) IMPLANT
DRILL NEURO 2X3.1 SOFT TOUCH (MISCELLANEOUS) ×1 IMPLANT
DRSG MEPILEX FLEX 4X4 (GAUZE/BANDAGES/DRESSINGS) IMPLANT
DRSG OPSITE 4X5.5 SM (GAUZE/BANDAGES/DRESSINGS) ×2 IMPLANT
DRSG OPSITE POSTOP 3X4 (GAUZE/BANDAGES/DRESSINGS) ×1 IMPLANT
DURAPREP 26ML APPLICATOR (WOUND CARE) ×1 IMPLANT
ELECT COATED BLADE 2.86 ST (ELECTRODE) ×1 IMPLANT
ELECT REM PT RETURN 9FT ADLT (ELECTROSURGICAL) ×1 IMPLANT
ELECTRODE REM PT RTRN 9FT ADLT (ELECTROSURGICAL) ×1 IMPLANT
ENDOSKELETON IMPLANT 16X14X7X6 (Cage) ×1 IMPLANT
GAUZE 4X4 16PLY ~~LOC~~+RFID DBL (SPONGE) IMPLANT
GLOVE BIO SURGEON STRL SZ7 (GLOVE) ×2 IMPLANT
GLOVE BIOGEL PI IND STRL 7.5 (GLOVE) ×2 IMPLANT
GLOVE ECLIPSE 7.5 STRL STRAW (GLOVE) ×1 IMPLANT
GOWN STRL REUS W/ TWL LRG LVL3 (GOWN DISPOSABLE) ×2 IMPLANT
GOWN STRL REUS W/ TWL XL LVL3 (GOWN DISPOSABLE) ×2 IMPLANT
GOWN STRL REUS W/TWL 2XL LVL3 (GOWN DISPOSABLE) IMPLANT
HEMOSTAT POWDER KIT SURGIFOAM (HEMOSTASIS) ×1 IMPLANT
KIT BASIN OR (CUSTOM PROCEDURE TRAY) ×1 IMPLANT
KIT TURNOVER KIT B (KITS) ×1 IMPLANT
NDL HYPO 22X1.5 SAFETY MO (MISCELLANEOUS) ×1 IMPLANT
NDL SPNL 22GX3.5 QUINCKE BK (NEEDLE) ×1 IMPLANT
NEEDLE HYPO 22X1.5 SAFETY MO (MISCELLANEOUS) ×1 IMPLANT
NEEDLE SPNL 22GX3.5 QUINCKE BK (NEEDLE) ×1 IMPLANT
NS IRRIG 1000ML POUR BTL (IV SOLUTION) ×1 IMPLANT
PACK LAMINECTOMY NEURO (CUSTOM PROCEDURE TRAY) ×1 IMPLANT
PAD ARMBOARD POSITIONER FOAM (MISCELLANEOUS) ×3 IMPLANT
PATTIES SURGICAL .5 X3 (DISPOSABLE) ×1 IMPLANT
PIN DISTRACTION TAP 12 DISP (PIN) ×2 IMPLANT
PLATE ZEVO 1LVL 19MM (Plate) IMPLANT
PUTTY DBF 1CC CORTICAL FIBERS (Putty) IMPLANT
SCREW VA SD 3.5X16 (Screw) IMPLANT
SPIKE FLUID TRANSFER (MISCELLANEOUS) ×1 IMPLANT
SPONGE INTESTINAL PEANUT (DISPOSABLE) ×1 IMPLANT
STAPLER VISISTAT (STAPLE) IMPLANT
STRIP CLOSURE SKIN 1/2X4 (GAUZE/BANDAGES/DRESSINGS) ×1 IMPLANT
SUT MNCRL AB 4-0 PS2 18 (SUTURE) ×1 IMPLANT
SUT SILK 2 0 TIES 10X30 (SUTURE) IMPLANT
SUT VIC AB 3-0 SH 8-18 (SUTURE) ×1 IMPLANT
TAPE CLOTH 3X10 TAN LF (GAUZE/BANDAGES/DRESSINGS) ×1 IMPLANT
TIP KERRISON THIN FOOTPLATE 2M (MISCELLANEOUS) ×1 IMPLANT
TOWEL GREEN STERILE (TOWEL DISPOSABLE) ×1 IMPLANT
TOWEL GREEN STERILE FF (TOWEL DISPOSABLE) ×1 IMPLANT
WATER STERILE IRR 1000ML POUR (IV SOLUTION) ×1 IMPLANT

## 2024-03-28 NOTE — H&P (Addendum)
 HPI:     Patient is a 73 y.o. male w/ h/o CAD, Afib s/p watchman who presented in outpatient clinic w/ complaints of neck pain w/ radiation to his LUE. Failed conservative mgmt including PT. Workup revealing DDD and herniation at C5-6 with mod/severe central stenosis with subtle cord signal change. He wishes to proceed w/ ACDF C5-6.     Patient Active Problem List   Diagnosis Date Noted   Wound infection 08/11/2023   Iron deficiency 02/16/2023   PAF (paroxysmal atrial fibrillation) (HCC) 02/02/2023   Atrial fibrillation (HCC) 02/02/2023   Presence of Watchman left atrial appendage closure device 02/02/2023   Diarrhea 11/16/2022   Anemia 11/16/2022   Subdural hematoma (HCC) 05/04/2022   SDH (subdural hematoma) (HCC) 02/03/2022   Right ear pain 07/13/2021   Venous insufficiency of right leg 08/19/2020   Thrombocytopenia (HCC) 09/08/2018   Chronic diastolic congestive heart failure (HCC) 08/17/2017   Hypokalemia 05/23/2012   Rheumatoid aortitis 05/23/2012   Pulmonary embolism (HCC) 04/19/2012   Chronic renal insufficiency 04/17/2012   Obesity, morbid (HCC) 01/16/2012   CHF (congestive heart failure) (HCC) 01/16/2012   COPD (chronic obstructive pulmonary disease) (HCC) 01/16/2012   OSA on CPAP 01/16/2012   Type 2 diabetes mellitus with stage 3b chronic kidney disease, without long-term current use of insulin (HCC) 01/16/2012   Gout 01/04/2012   Hypertension 01/04/2012   Past Medical History:  Diagnosis Date   Chronic diarrhea    CKD (chronic kidney disease) stage 3, GFR 30-59 ml/min (HCC)    COPD (chronic obstructive pulmonary disease) (HCC)    Dysrhythmia 02/2022   paroxsymal SVT & aflutter 02/2022 XioXT   Essential hypertension    Fibromyalgia    Gallstones    Gout    History of CHF (congestive heart failure)    History of colonic polyps    History of DVT (deep vein thrombosis) 04/19/2012   BLE DVT 04/09/12   Iron deficiency 02/16/2023   Kidney stones    Lumbar disc  disease    Neuropathy    Nonischemic cardiomyopathy (HCC) 2003   Minor coronary atherosclerosis at cardiac catheterization 2003, LVEF initially 20% with normalization   Obesity    Orthostatic hypotension    Presence of Watchman left atrial appendage closure device 02/02/2023   27mm Watchman placed by Dr. Excell Seltzer   RA (rheumatoid arthritis) [facility])    Sleep apnea    Subarachnoid hemorrhage (HCC) 02/03/2022   Traumatic   Subdural hematoma (HCC) 02/03/2022   Traumatic   Syncope, vasovagal    Type 2 diabetes mellitus (HCC)     Past Surgical History:  Procedure Laterality Date   BARIATRIC SURGERY  01/11/2018   BURR HOLE Right 05/04/2022   Procedure: BURR HOLES FOR Subdural Hematoma;  Surgeon: Bedelia Person, MD;  Location: [facility] OR;  Service: Neurosurgery;  Laterality: Right;   CHOLECYSTECTOMY  01/18/2012   Procedure: LAPAROSCOPIC CHOLECYSTECTOMY WITH INTRAOPERATIVE CHOLANGIOGRAM;  Surgeon: Clovis Pu. Cornett, MD;  Location: [facility] ORS;  Service: General;  Laterality: N/A;   COLONOSCOPY  05/2017   [facility]: five polyps removed but only 3 retrieved due to bowel prep, pathology with tubular adenomas   EYE SURGERY Right    Film over retina was removed.   I & D EXTREMITY Right 08/12/2023   Procedure: IRRIGATION AND DEBRIDEMENT RIGHT FOREARM;  Surgeon: Ramon Dredge, MD;  Location[facility] OR;  Service: Orthopedics;  Laterality: Right;   LEFT ATRIAL APPENDAGE OCCLUSION N/A 02/02/2023   Procedure: LEFT ATRIAL APPENDAGE OCCLUSION;  Surgeon: Tonny Bollman, MD;  Location: [facility] INVASIVE CV LAB;  Service: Cardiovascular;  Laterality: N/A;   PILONIDAL CYST EXCISION  1997   TEE WITHOUT CARDIOVERSION N/A 02/02/2023   Procedure: TRANSESOPHAGEAL ECHOCARDIOGRAM;  Surgeon: Tonny Bollman, MD;  Location: [facility] INVASIVE CV LAB;  Service: Cardiovascular;  Laterality: N/A;   TOE AMPUTATION Right    3 toes on right foot- big toe and next 2   UMBILICAL HERNIA REPAIR     VARICOSE VEIN SURGERY      left leg   watchman implant      Facility-Administered Medications Prior to Admission  Medication Dose Route Frequency Provider Last Rate Last Admin   betamethasone acetate-betamethasone sodium phosphate (CELESTONE) injection 6 mg  6 mg Intramuscular Once        Medications Prior to Admission  Medication Sig Dispense Refill Last Dose/Taking   acetaminophen (TYLENOL) 500 MG tablet Take 500 mg by mouth every 6 (six) hours as needed for mild pain.   03/27/2024   allopurinol (ZYLOPRIM) 300 MG tablet TAKE 1 TABLET BY MOUTH EVERY  MORNING 100 tablet 0 03/27/2024   amLODipine-olmesartan (AZOR) 10-40 MG tablet TAKE 1 TABLET BY MOUTH DAILY 100 tablet 0 03/27/2024   atorvastatin (LIPITOR) 80 MG tablet Take 1 tablet (80 mg total) by mouth daily. 100 tablet 0 03/27/2024   calcitRIOL (ROCALTROL) 0.25 MCG capsule Take 1 capsule (0.25 mcg total) by mouth daily with breakfast. 100 capsule 0 03/27/2024   calcium carbonate (OSCAL) 1500 (600 Ca) MG TABS tablet Take 600 mg of elemental calcium by mouth 2 (two) times daily with a meal.   03/27/2024   carvedilol (COREG) 6.25 MG tablet TAKE ONE-HALF TABLET BY MOUTH  EVERY MORNING AND 1 TABLET BY  MOUTH EVERY EVENING 150 tablet 0 03/27/2024   celecoxib (CELEBREX) 200 MG capsule TAKE 1 CAPSULE BY MOUTH DAILY  WITH FOOD 100 capsule 0 03/27/2024   cloNIDine (CATAPRES) 0.2 MG tablet TAKE 1 TABLET BY MOUTH TWICE  DAILY FOR BLOOD PRESSURE (Patient taking differently: Take 0.2 mg by mouth daily.) 200 tablet 0 03/27/2024   DULoxetine (CYMBALTA) 30 MG capsule TAKE 1 CAPSULE BY MOUTH TWICE  DAILY 200 capsule 0 03/27/2024   ferrous sulfate 325 (65 FE) MG tablet Take 325 mg by mouth in the morning.   03/27/2024   furosemide (LASIX) 40 MG tablet TAKE 1 TABLET BY MOUTH IN THE  MORNING 100 tablet 0 03/27/2024   glimepiride (AMARYL) 2 MG tablet Take 2 mg by mouth daily with breakfast.   03/27/2024   HYDROcodone-acetaminophen (NORCO/VICODIN) 5-325 MG tablet Take 1 tablet by mouth every 6 (six) hours as  needed for severe pain (pain score 7-10). 30 tablet 0 03/27/2024   hydroxychloroquine (PLAQUENIL) 200 MG tablet TAKE 1 TABLET BY MOUTH IN THE  MORNING AND AT BEDTIME 200 tablet 2 03/27/2024   Multiple Vitamin (MULTIVITAMIN WITH MINERALS) TABS tablet Take 1 tablet by mouth in the morning.   03/27/2024   pioglitazone (ACTOS) 30 MG tablet TAKE 1 TABLET BY MOUTH DAILY 100 tablet 0 03/27/2024   potassium chloride SA (KLOR-CON M) 20 MEQ tablet TAKE 1 TABLET BY MOUTH DAILY FOR POTASSIUM REPLACEMENT/  SUPPLEMENT 100 tablet 0 03/27/2024   pregabalin (LYRICA) 300 MG capsule Take 1 capsule (300 mg total) by mouth at bedtime. For neck 90 capsule 1 03/27/2024   silver sulfADIAZINE (SILVADENE) 1 % cream Apply topically as needed.   Past Week   tamsulosin (FLOMAX) 0.4 MG CAPS capsule TAKE 1 CAPSULE BY MOUTH AT  BEDTIME 100 capsule 0 03/27/2024   vitamin B-12 (CYANOCOBALAMIN) 500 MCG tablet Take 500 mcg by mouth in the morning.   03/27/2024   fexofenadine-pseudoephedrine (ALLEGRA-D 24) 180-240 MG 24 hr tablet Take 1 tablet by mouth every evening. For allergy and congestion (Patient not taking: Reported on 03/26/2024) 30 tablet 11 Not Taking   Allergies  Allergen Reactions   Ciprofloxacin Hives   Definity [Perflutren Lipid Microsphere] Nausea And Vomiting    During ECHO with Definity injection pt became nauseated and vomited    Levofloxacin [Levofloxacin] Hives    Social History   Tobacco Use   Smoking status: Former    Current packs/day: 0.00    Average packs/day: 2.0 packs/day for 25.0 years (50.0 ttl pk-yrs)    Types: Cigarettes    Start date: 12/26/1969    Quit date: 12/26/1994    Years since quitting: 29.2   Smokeless tobacco: Never  Substance Use Topics   Alcohol use: No    Family History  Problem Relation Age of Onset   Hypertension Mother    Stroke Mother    Diabetes Father    Hypertension Father    Heart disease Father    Diabetes Brother    Colon cancer Neg Hx      Objective:   Patient Vitals for the  past 8 hrs:  BP Temp Temp src Pulse Resp SpO2 Height Weight  03/28/24 0737 (!) 175/69 97.8 F (36.6 C) Oral 63 20 97 % 6\' 1"  (1.854 m) (!) 153.9 kg   No intake/output data recorded. No intake/output data recorded.  PE: 2+ DTR B, BR, Triceps + Spurlings on L 5/5 strength R D,B, Triceps, HG 4/5 LUE D, B, 4+/5 triceps NAD   Assessment:   This is a 38M w/ C5-6 disc herniation w/ severe central stenosis and cord impingement with LUE radiculopathy   Plan:   -OR today to ACDF C5-6. Discussed expected convalescence as well as risks/benefits/alternatives. Questions encouraged/answered.   SARA Margaree Mackintosh, PA-C

## 2024-03-28 NOTE — Anesthesia Postprocedure Evaluation (Signed)
 Anesthesia Post Note  Patient: Mitchell AYLWARD Sr.  Procedure(s) Performed: CERVICAL FIVE-SIX ANTERIOR CERVICAL DECOMPRESSION/DISCECTOMY FUSION (Left)     Patient location during evaluation: PACU Anesthesia Type: General Level of consciousness: awake and alert Pain management: pain level controlled Vital Signs Assessment: post-procedure vital signs reviewed and stable Respiratory status: spontaneous breathing, nonlabored ventilation and respiratory function stable Cardiovascular status: blood pressure returned to baseline and stable Postop Assessment: no apparent nausea or vomiting Anesthetic complications: no   There were no known notable events for this encounter.  Last Vitals:  Vitals:   03/28/24 1345 03/28/24 1400  BP: (!) 173/60 (!) 165/63  Pulse: (!) 49 (!) 53  Resp: 11 14  Temp:  36.5 C  SpO2: 92% 93%    Last Pain:  Vitals:   03/28/24 1400  TempSrc:   PainSc: 0-No pain                 Collene Schlichter

## 2024-03-28 NOTE — Progress Notes (Signed)
 Orthopedic Tech Progress Note Patient Details:  Mitchell Parker 09-17-1951 [id number]  Ortho Devices Type of Ortho Device: Soft collar Ortho Device/Splint Location: Neck Ortho Device/Splint Interventions: Ordered, Application, Adjustment   Post Interventions Patient Tolerated: Well  Constantina Laseter A Deyon Chizek 03/28/2024, 4:19 PM

## 2024-03-28 NOTE — Anesthesia Procedure Notes (Signed)
 Procedure Name: Intubation Date/Time: 03/28/2024 10:39 AM  Performed by: Flonnie Hailstone, CRNAPre-anesthesia Checklist: Patient identified, Emergency Drugs available, Suction available and Patient being monitored Patient Re-evaluated:Patient Re-evaluated prior to induction Oxygen Delivery Method: Circle system utilized Preoxygenation: Pre-oxygenation with 100% oxygen Induction Type: IV induction Ventilation: Mask ventilation without difficulty and Two handed mask ventilation required Laryngoscope Size: Glidescope and 4 Grade View: Grade I Tube type: Oral Tube size: 8.0 mm Number of attempts: 1 Airway Equipment and Method: Stylet Placement Confirmation: ETT inserted through vocal cords under direct vision, positive ETCO2 and breath sounds checked- equal and bilateral Secured at: 24 cm Tube secured with: Tape Dental Injury: Teeth and Oropharynx as per pre-operative assessment  Comments: Intubated by Nils Pyle, SRNA; ebbs; two-hand mask without opa.

## 2024-03-28 NOTE — Transfer of Care (Signed)
 Immediate Anesthesia Transfer of Care Note  Patient: Mitchell Rod Sr.  Procedure(s) Performed: CERVICAL FIVE-SIX ANTERIOR CERVICAL DECOMPRESSION/DISCECTOMY FUSION (Left)  Patient Location: PACU  Anesthesia Type:General  Level of Consciousness: awake, alert , patient cooperative, and responds to stimulation  Airway & Oxygen Therapy: Patient Spontanous Breathing  Post-op Assessment: Report given to RN and Post -op Vital signs reviewed and stable  Post vital signs: Reviewed and stable  Last Vitals:  Vitals Value Taken Time  BP 153/57 03/28/24 1305  Temp 36.5 C 03/28/24 1305  Pulse 53 03/28/24 1305  Resp 12 03/28/24 1305  SpO2 93 % 03/28/24 1305  Vitals shown include unfiled device data.  Patient states 0/10 pain   Complications: There were no known notable events for this encounter.

## 2024-03-28 NOTE — Op Note (Signed)
 PREOP DIAGNOSIS: Cervical stenosis with myelopathy  POSTOP DIAGNOSIS: Cervical stenosis with myelopathy   PROCEDURE: 1. Arthrodesis C5-6, anterior interbody technique, including Discectomy for decompression of spinal cord and exiting nerve roots with foraminotomies  2. Placement of intervertebral biomechanical device C5-6 3. Placement of anterior instrumentation consisting of interbody plate and screws C5-6 4. Use of morselized bone allograft  5. Use of intraoperative microscope  SURGEON: Dr. Hoyt Koch, MD  ASSISTANT: Patrici Ranks, PA.  Please note, no qualified trainees were available to assist with the procedure.  Assistance was required for retraction of the visceral structures to safely allow for instrumentation.  ANESTHESIA: General Endotracheal  EBL: 25 ml  IMPLANTS: Medtronic 7 mm medium Titan-C cage 19 mm Zevo plate 16 mm screws x 4 DBF  SPECIMENS: None  DRAINS: None  COMPLICATIONS: None immediate  CONDITION: Hemodynamically stable to PACU  HISTORY: Mitchell Parker. is a 73 y.o. y.o. male with hx of Afib s/p Watchman who presented with neck pain and left upper extremity pain and fucntional weakness.  PT and medical therapy failed to improve his symptoms.  He was found to have C5-6 moderate-to-severe stenosis and cord signal change.  Risks, benefits, alternatives, and expected convalescence were discussed with the patient.  Risks discussed included but were not limited to bleeding, pain, infection, dysphagia, dysphonia, pseudoarthrosis, hardware failure, adjacent segment disease, CSF leak, neurologic deficits, weakness, numbness, paralysis, coma, and death. After all questions were answered, informed consent was obtained.  PROCEDURE IN DETAIL: The patient was brought to the operating room and transferred to the operative table. After induction of general anesthesia, the patient was positioned on the operative table in the supine position with all pressure points  meticulously padded. The skin of the neck was then prepped and draped in the usual sterile fashion.  After timeout was conducted, the skin was infiltrated with local anesthetic. Skin incision was then made sharply and Bovie electrocautery was used to dissect the subcutaneous tissue until the platysma was identified. The platysma was then divided and undermined. The sternocleidomastoid muscle was then identified and, utilizing natural fascial planes in the neck, the prevertebral fascia was identified and the carotid sheath was retracted laterally and the trachea and esophagus retracted medially. Again using fluoroscopy, the C5-6 disc space was identified. Bovie electrocautery was used to dissect in the subperiosteal plane and elevate the bilateral longus coli muscles. Self-retaining retractors were then placed. Anterior osteophyte was removed.  Caspar distraction pins were placed in the adjacent bodies to allow for gentle distraction.  At this point, the microscope was draped and brought into the field, and the remainder of the case was done under the microscope using microdissecting technique.  The disc space was incised sharply and combination of high speed drill, curettes, and rongeurs were use to initially complete a discectomy. The high-speed drill was then used to complete discectomy until the posterior annulus was identified and removed and the posterior longitudinal ligament was identified. Using a nerve hook, the PLL was elevated, and Kerrison rongeurs were used to remove the posterior longitudinal ligament and the ventral thecal sac was identified. Using a combination of curettes and rongeurs, complete decompression of the thecal sac and exiting nerve roots at this level was completed, and verified with easy passage of micro-nerve hook centrally and in the bilateral foramina.  Having completed our decompression, attention was turned to placement of the intervertebral device. Trial spacers were used to  select a size 7 mm graft. This graft was then filled  with morcellized allograft, and inserted under live fluoroscopy.   After placement of the intervertebral devices, the caspar pins were removed.  An anterior cervical plate was placed across the interspaces for anterior fixation.  Using a high-speed drill, the cortex of the cervical vertebral bodies was punctured, and screws inserted in the vertebral bodies. Final fluoroscopic images in AP and lateral projections were taken to confirm good hardware placement.  At this point, after all counts were verified to be correct, meticulous hemostasis was secured using a combination of bipolar electrocautery and passive hemostatics. The platysma muscle was then closed using interrupted 3-0 Vicryl sutures, and the skin was closed with a 4-0 monocryl in subcutical fashion. Sterile dressings were then applied and the drapes removed.  The patient tolerated the procedure well and was extubated in the room and taken to the postanesthesia care unit in stable condition.  All counts were correct at the end of the procedure.

## 2024-03-29 ENCOUNTER — Encounter (HOSPITAL_COMMUNITY): Payer: Self-pay | Admitting: Neurosurgery

## 2024-03-31 ENCOUNTER — Encounter: Payer: Self-pay | Admitting: Cardiology

## 2024-04-02 ENCOUNTER — Telehealth: Payer: Self-pay

## 2024-04-02 ENCOUNTER — Telehealth: Payer: Self-pay | Admitting: Pharmacist

## 2024-04-02 MED ORDER — OZEMPIC (0.25 OR 0.5 MG/DOSE) 2 MG/3ML ~~LOC~~ SOPN: 0.5000 mg | PEN_INJECTOR | SUBCUTANEOUS | Status: DC

## 2024-04-02 NOTE — Telephone Encounter (Signed)
 Copied from CRM [id number]. Topic: General - Other >> Apr 02, 2024 10:40 AM Shelah Lewandowsky wrote: Reason for CRM: Patient returning Julie's call-  [phone]

## 2024-04-02 NOTE — Telephone Encounter (Signed)
   Wanted to confirm patient is enrolled in the Novo Nordisk PAP.  Patient states he received tracking number, but it does not currently work (may be the automated system is down).  Sample give to patient to bridge.  Can you update Novo documentation/approval?  Thank you!   Kieth Brightly, PharmD, BCACP, CPP Clinical Pharmacist, [facility]

## 2024-04-03 NOTE — Telephone Encounter (Signed)
 Returned patients call.

## 2024-04-09 ENCOUNTER — Telehealth: Payer: Self-pay | Admitting: Pharmacist

## 2024-04-09 NOTE — Telephone Encounter (Signed)
 Ozempic supply has been located in PCP office (was previously missing) Left VM on patient's home phone to notify him it is here for pick up

## 2024-04-10 ENCOUNTER — Other Ambulatory Visit: Payer: Self-pay | Admitting: *Deleted

## 2024-04-12 ENCOUNTER — Other Ambulatory Visit: Payer: Self-pay | Admitting: Family Medicine

## 2024-04-15 ENCOUNTER — Encounter: Payer: Self-pay | Admitting: Hematology

## 2024-04-16 ENCOUNTER — Other Ambulatory Visit: Payer: Self-pay | Admitting: *Deleted

## 2024-04-22 ENCOUNTER — Encounter: Payer: Self-pay | Admitting: Family Medicine

## 2024-04-22 ENCOUNTER — Ambulatory Visit (INDEPENDENT_AMBULATORY_CARE_PROVIDER_SITE_OTHER): Payer: Medicare Other | Admitting: Family Medicine

## 2024-04-22 VITALS — BP 123/66 | HR 56 | Temp 98.2°F | Ht 73.0 in | Wt 340.0 lb

## 2024-04-22 DIAGNOSIS — E782 Mixed hyperlipidemia: Secondary | ICD-10-CM | POA: Diagnosis not present

## 2024-04-22 DIAGNOSIS — E1122 Type 2 diabetes mellitus with diabetic chronic kidney disease: Secondary | ICD-10-CM

## 2024-04-22 DIAGNOSIS — I011 Acute rheumatic endocarditis: Secondary | ICD-10-CM

## 2024-04-22 DIAGNOSIS — N184 Chronic kidney disease, stage 4 (severe): Secondary | ICD-10-CM

## 2024-04-22 DIAGNOSIS — E1169 Type 2 diabetes mellitus with other specified complication: Secondary | ICD-10-CM | POA: Diagnosis not present

## 2024-04-22 DIAGNOSIS — I10 Essential (primary) hypertension: Secondary | ICD-10-CM | POA: Diagnosis not present

## 2024-04-22 DIAGNOSIS — E785 Hyperlipidemia, unspecified: Secondary | ICD-10-CM

## 2024-04-22 DIAGNOSIS — I480 Paroxysmal atrial fibrillation: Secondary | ICD-10-CM | POA: Diagnosis not present

## 2024-04-22 DIAGNOSIS — N400 Enlarged prostate without lower urinary tract symptoms: Secondary | ICD-10-CM

## 2024-04-22 DIAGNOSIS — Z7985 Long-term (current) use of injectable non-insulin antidiabetic drugs: Secondary | ICD-10-CM

## 2024-04-22 DIAGNOSIS — I509 Heart failure, unspecified: Secondary | ICD-10-CM | POA: Diagnosis not present

## 2024-04-22 DIAGNOSIS — M1A00X0 Idiopathic chronic gout, unspecified site, without tophus (tophi): Secondary | ICD-10-CM

## 2024-04-22 LAB — CBC WITH DIFFERENTIAL/PLATELET
Basophils Absolute: 0 10*3/uL (ref 0.0–0.2)
Basos: 1 %
EOS (ABSOLUTE): 0.3 10*3/uL (ref 0.0–0.4)
Eos: 6 %
Hematocrit: 42.1 % (ref 37.5–51.0)
Hemoglobin: 14.2 g/dL (ref 13.0–17.7)
Immature Grans (Abs): 0 10*3/uL (ref 0.0–0.1)
Immature Granulocytes: 0 %
Lymphocytes Absolute: 0.7 10*3/uL (ref 0.7–3.1)
Lymphs: 15 %
MCH: 33.4 pg — ABNORMAL HIGH (ref 26.6–33.0)
MCHC: 33.7 g/dL (ref 31.5–35.7)
MCV: 99 fL — ABNORMAL HIGH (ref 79–97)
Monocytes Absolute: 0.5 10*3/uL (ref 0.1–0.9)
Monocytes: 9 %
Neutrophils Absolute: 3.5 10*3/uL (ref 1.4–7.0)
Neutrophils: 69 %
Platelets: 104 10*3/uL — ABNORMAL LOW (ref 150–450)
RBC: 4.25 x10E6/uL (ref 4.14–5.80)
RDW: 13.2 % (ref 11.6–15.4)
WBC: 5.1 10*3/uL (ref 3.4–10.8)

## 2024-04-22 LAB — CMP14+EGFR
ALT: 72 IU/L — ABNORMAL HIGH (ref 0–44)
AST: 61 IU/L — ABNORMAL HIGH (ref 0–40)
Albumin: 4 g/dL (ref 3.9–4.9)
Alkaline Phosphatase: 72 IU/L (ref 44–121)
BUN/Creatinine Ratio: 30 — ABNORMAL HIGH (ref 10–24)
BUN: 33 mg/dL — ABNORMAL HIGH (ref 8–27)
Bilirubin Total: 0.5 mg/dL (ref 0.0–1.2)
CO2: 26 mmol/L (ref 20–29)
Calcium: 9.5 mg/dL (ref 8.6–10.2)
Chloride: 106 mmol/L (ref 96–106)
Creatinine, Ser: 1.09 mg/dL (ref 0.76–1.27)
Globulin, Total: 2.5 g/dL (ref 1.5–4.5)
Glucose: 89 mg/dL (ref 70–99)
Potassium: 4.4 mmol/L (ref 3.5–5.2)
Sodium: 144 mmol/L (ref 134–144)
Total Protein: 6.5 g/dL (ref 6.0–8.5)
eGFR: 73 mL/min/{1.73_m2} (ref >59)

## 2024-04-22 LAB — LIPID PANEL
Chol/HDL Ratio: 2.7 ratio (ref 0.0–5.0)
Cholesterol, Total: 109 mg/dL (ref 100–199)
HDL: 40 mg/dL (ref >39)
LDL Chol Calc (NIH): 45 mg/dL (ref 0–99)
Triglycerides: 135 mg/dL (ref 0–149)
VLDL Cholesterol Cal: 24 mg/dL (ref 5–40)

## 2024-04-22 LAB — BAYER DCA HB A1C WAIVED: HB A1C (BAYER DCA - WAIVED): 5.5 % (ref 4.8–5.6)

## 2024-04-22 MED ORDER — FUROSEMIDE 40 MG PO TABS: 40.0000 mg | ORAL_TABLET | Freq: Every morning | ORAL | 0 refills | Status: DC

## 2024-04-22 MED ORDER — PREGABALIN 300 MG PO CAPS
300.0000 mg | ORAL_CAPSULE | Freq: Every day | ORAL | 1 refills | Status: DC
Start: 2024-04-22 — End: 2024-07-29

## 2024-04-22 MED ORDER — POTASSIUM CHLORIDE CRYS ER 20 MEQ PO TBCR: EXTENDED_RELEASE_TABLET | ORAL | 0 refills | Status: DC

## 2024-04-22 MED ORDER — ATORVASTATIN CALCIUM 80 MG PO TABS
80.0000 mg | ORAL_TABLET | Freq: Every day | ORAL | 0 refills | Status: DC
Start: 2024-04-22 — End: 2024-08-05

## 2024-04-22 MED ORDER — TAMSULOSIN HCL 0.4 MG PO CAPS: 0.8000 mg | ORAL_CAPSULE | Freq: Every day | ORAL | 3 refills | Status: DC

## 2024-04-22 MED ORDER — PIOGLITAZONE HCL 30 MG PO TABS: 30.0000 mg | ORAL_TABLET | Freq: Every day | ORAL | 0 refills | Status: DC

## 2024-04-22 MED ORDER — HYDROXYCHLOROQUINE SULFATE 200 MG PO TABS: 200.0000 mg | ORAL_TABLET | Freq: Two times a day (BID) | ORAL | 2 refills | Status: DC

## 2024-04-22 MED ORDER — DULOXETINE HCL 30 MG PO CPEP: ORAL_CAPSULE | ORAL | Status: DC

## 2024-04-22 MED ORDER — CALCITRIOL 0.25 MCG PO CAPS: 0.2500 ug | ORAL_CAPSULE | Freq: Every day | ORAL | 0 refills | Status: DC

## 2024-04-22 MED ORDER — ALLOPURINOL 300 MG PO TABS: 300.0000 mg | ORAL_TABLET | Freq: Every morning | ORAL | 0 refills | Status: AC

## 2024-04-22 NOTE — Progress Notes (Signed)
 Subjective:  Patient ID: Mitchell Carder.,  male    DOB: 05/16/1951  Age: 73 y.o.    CC: Medical Management of Chronic Issues (Follow up after spinal surgery. Still stiff but doing good. )   HPI Mitchell OXBORROW Sr. presents for  follow-up of hypertension. Patient has no history of headache chest pain or shortness of breath or recent cough. Patient also denies symptoms of TIA such as numbness weakness lateralizing. Patient denies side effects from medication. States taking it regularly.  Patient also  in for follow-up of elevated cholesterol. Doing well without complaints on current medication. Denies side effects  including myalgia and arthralgia and nausea. Also in today for liver function testing. Currently no chest pain, shortness of breath or other cardiovascular related symptoms noted.  Follow-up of diabetes. Patient does check blood sugar at home. nO READINGS AVAILABLE. Patient denies symptoms such as excessive hunger or urinary frequency, excessive hunger, nausea No significant hypoglycemic spells noted. Medications reviewed. Pt reports taking them regularly. Pt. denies complication/adverse reaction today.   Still a little stiff from spine surgery 3 weeks ago. Pain is a lot better.     01/18/2024    8:12 AM 10/19/2023    4:01 PM 10/05/2023    9:44 AM 09/12/2023    3:09 PM 08/24/2023    7:57 AM  Depression screen PHQ 2/9  Decreased Interest 0 0 0 0 0  Down, Depressed, Hopeless 0 0 0 1 0  PHQ - 2 Score 0 0 0 1 0  Altered sleeping 0 0  0 2  Tired, decreased energy 0 0  1 1  Change in appetite 0 0  0 0  Feeling bad or failure about yourself  0 0  0 0  Trouble concentrating 0 0  0 0  Moving slowly or fidgety/restless 0 0  0 0  Suicidal thoughts 0 0  0 0  PHQ-9 Score 0 0  2 3  Difficult doing work/chores Not difficult at all Not difficult at all  Not difficult at all Somewhat difficult    History Mitchell Parker has a past medical history of Chronic diarrhea, CKD (chronic kidney  disease) stage 3, GFR 30-59 ml/min (HCC), COPD (chronic obstructive pulmonary disease) (HCC), Dysrhythmia (02/2022), Essential hypertension, Fibromyalgia, Gallstones, Gout, History of CHF (congestive heart failure), History of colonic polyps, History of DVT (deep vein thrombosis) (04/19/2012), Iron deficiency (02/16/2023), Kidney stones, Lumbar disc disease, Neuropathy, Nonischemic cardiomyopathy (HCC) (2003), Obesity, Orthostatic hypotension, Presence of Watchman left atrial appendage closure device (02/02/2023), RA (rheumatoid arthritis) (HCC), Sleep apnea, Subarachnoid hemorrhage (HCC) (02/03/2022), Subdural hematoma (HCC) (02/03/2022), Syncope, vasovagal, and Type 2 diabetes mellitus (HCC).   He has a past surgical history that includes Umbilical hernia repair; Pilonidal cyst excision (1997); Varicose vein surgery; Cholecystectomy (01/18/2012); Bariatric Surgery (01/11/2018); Eye surgery (Right); [facility] (Right, 05/04/2022); Toe amputation (Right); LEFT ATRIAL APPENDAGE OCCLUSION (N/A, 02/02/2023); TEE without cardioversion (N/A, 02/02/2023); Colonoscopy (05/2017); watchman implant; I & D extremity (Right, 08/12/2023); and Anterior cervical decomp/discectomy fusion (Left, 03/28/2024).   His family history includes Diabetes in his brother and father; Heart disease in his father; Hypertension in his father and mother; Stroke in his mother.He reports that he quit smoking about 29 years ago. His smoking use included cigarettes. He started smoking about 54 years ago. He has a 50 pack-year smoking history. He has never used smokeless tobacco. He reports that he does not drink alcohol and does not use drugs.  Current Outpatient Medications on File Prior  to Visit  Medication Sig Dispense Refill   acetaminophen  (TYLENOL ) 500 MG tablet Take 500 mg by mouth every 6 (six) hours as needed for mild pain.     amLODipine -olmesartan  (AZOR ) 10-40 MG tablet TAKE 1 TABLET BY MOUTH DAILY 100 tablet 0   calcium  carbonate  (OSCAL) 1500 (600 Ca) MG TABS tablet Take 600 mg of elemental calcium  by mouth 2 (two) times daily with a meal.     carvedilol  (COREG ) 6.25 MG tablet TAKE ONE-HALF TABLET BY MOUTH  EVERY MORNING AND 1 TABLET BY  MOUTH EVERY EVENING 150 tablet 0   cloNIDine  (CATAPRES ) 0.2 MG tablet TAKE 1 TABLET BY MOUTH TWICE  DAILY FOR BLOOD PRESSURE (Patient taking differently: Take 0.2 mg by mouth daily.) 200 tablet 0   cyclobenzaprine  (FLEXERIL ) 5 MG tablet Take 1-2 tablets (5-10 mg total) by mouth 3 (three) times daily as needed for muscle spasms. 90 tablet 0   docusate sodium  (COLACE) 100 MG capsule Take 1 capsule (100 mg total) by mouth 2 (two) times daily as needed for mild constipation or moderate constipation. 60 capsule 2   ferrous sulfate  325 (65 FE) MG tablet Take 325 mg by mouth in the morning.     fexofenadine -pseudoephedrine (ALLEGRA-D 24) 180-240 MG 24 hr tablet Take 1 tablet by mouth every evening. For allergy and congestion 30 tablet 11   Multiple Vitamin (MULTIVITAMIN WITH MINERALS) TABS tablet Take 1 tablet by mouth in the morning.     oxyCODONE -acetaminophen  (PERCOCET) 5-325 MG tablet Take 1 tablet by mouth every 4 (four) hours as needed for severe pain (pain score 7-10). 30 tablet 0   Semaglutide ,0.25 or 0.5MG /DOS, (OZEMPIC , 0.25 OR 0.5 MG/DOSE,) 2 MG/3ML SOPN Inject 0.5 mg into the skin once a week. Via novo nordisk patient assistance program     silver sulfADIAZINE (SILVADENE) 1 % cream Apply topically as needed.     vitamin B-12 (CYANOCOBALAMIN ) 500 MCG tablet Take 500 mcg by mouth in the morning.     Current Facility-Administered Medications on File Prior to Visit  Medication Dose Route Frequency Provider Last Rate Last Admin   betamethasone  acetate-betamethasone  sodium phosphate  (CELESTONE ) injection 6 mg  6 mg Intramuscular Once         ROS Review of Systems  Constitutional:  Negative for fever.  Respiratory:  Negative for shortness of breath.   Cardiovascular:  Negative for chest  pain.  Musculoskeletal:  Positive for back pain and neck pain. Negative for arthralgias.  Skin:  Negative for rash.    Objective:  BP 123/66   Pulse (!) 56   Temp 98.2 F (36.8 C)   Ht 6\' 1"  (1.854 m)   Wt (!) 340 lb (154.2 kg)   SpO2 96%   BMI 44.86 kg/m   BP Readings from Last 3 Encounters:  04/22/24 123/66  03/28/24 (!) 165/63  02/28/24 (!) 125/53    Wt Readings from Last 3 Encounters:  04/22/24 (!) 340 lb (154.2 kg)  03/28/24 (!) 339 lb 4.6 oz (153.9 kg)  02/23/24 (!) 339 lb 4.6 oz (153.9 kg)    Lab Results  Component Value Date   HGBA1C 5.5 04/22/2024   HGBA1C 5.4 01/18/2024   HGBA1C 6.1 (H) 08/11/2023    Physical Exam Vitals reviewed.  Constitutional:      Appearance: He is well-developed.  HENT:     Head: Normocephalic and atraumatic.     Right Ear: External ear normal.     Left Ear: External ear normal.  Mouth/Throat:     Pharynx: No oropharyngeal exudate or posterior oropharyngeal erythema.  Eyes:     Pupils: Pupils are equal, round, and reactive to light.  Cardiovascular:     Rate and Rhythm: Normal rate and regular rhythm.     Heart sounds: No murmur heard. Pulmonary:     Effort: No respiratory distress.     Breath sounds: Normal breath sounds.  Musculoskeletal:     Cervical back: Normal range of motion and neck supple.  Neurological:     Mental Status: He is alert and oriented to person, place, and time.         Assessment & Plan:  Type 2 diabetes mellitus with stage 4 chronic kidney disease, without long-term current use of insulin  (HCC) -     Bayer DCA Hb A1c Waived -     CMP14+EGFR -     Pioglitazone  HCl; Take 1 tablet (30 mg total) by mouth daily.  Dispense: 100 tablet; Refill: 0  Paroxysmal atrial fibrillation (HCC) -     CBC with Differential/Platelet -     CMP14+EGFR  Primary hypertension -     CBC with Differential/Platelet -     CMP14+EGFR -     Furosemide ; Take 1 tablet (40 mg total) by mouth every morning.   Dispense: 100 tablet; Refill: 0 -     Potassium Chloride  Crys ER; TAKE 1 TABLET BY MOUTH DAILY FOR POTASSIUM REPLACEMENT/  SUPPLEMENT  Dispense: 100 tablet; Refill: 0  Hyperlipidemia associated with type 2 diabetes mellitus (HCC) -     Bayer DCA Hb A1c Waived -     CMP14+EGFR -     Pioglitazone  HCl; Take 1 tablet (30 mg total) by mouth daily.  Dispense: 100 tablet; Refill: 0 -     Atorvastatin  Calcium ; Take 1 tablet (80 mg total) by mouth daily.  Dispense: 100 tablet; Refill: 0 -     Lipid panel  Idiopathic chronic gout without tophus, unspecified site -     Allopurinol ; Take 1 tablet (300 mg total) by mouth every morning.  Dispense: 100 tablet; Refill: 0  Mixed hyperlipidemia -     CMP14+EGFR -     Atorvastatin  Calcium ; Take 1 tablet (80 mg total) by mouth daily.  Dispense: 100 tablet; Refill: 0 -     Lipid panel  Benign prostatic hyperplasia without lower urinary tract symptoms -     CBC with Differential/Platelet -     CMP14+EGFR -     Tamsulosin  HCl; Take 2 capsules (0.8 mg total) by mouth at bedtime.  Dispense: 200 capsule; Refill: 3  Chronic congestive heart failure, unspecified heart failure type (HCC) -     Furosemide ; Take 1 tablet (40 mg total) by mouth every morning.  Dispense: 100 tablet; Refill: 0  Rheumatoid aortitis -     CMP14+EGFR -     Pregabalin ; Take 1 capsule (300 mg total) by mouth at bedtime. For neck  Dispense: 90 capsule; Refill: 1 -     Hydroxychloroquine  Sulfate; Take 1 tablet (200 mg total) by mouth 2 (two) times daily.  Dispense: 200 tablet; Refill: 2  Other orders -     Calcitriol ; Take 1 capsule (0.25 mcg total) by mouth daily with breakfast.  Dispense: 100 capsule; Refill: 0 -     DULoxetine  HCl; Take one daily for a week then DC the drug.    Follow-up: Return in about 3 months (around 07/22/2024) for diabetes.  Roise Cleaver, M.D.

## 2024-04-28 ENCOUNTER — Encounter: Payer: Self-pay | Admitting: Family Medicine

## 2024-05-01 ENCOUNTER — Other Ambulatory Visit: Payer: Self-pay | Admitting: *Deleted

## 2024-05-01 ENCOUNTER — Other Ambulatory Visit: Payer: Self-pay | Admitting: Family Medicine

## 2024-05-01 DIAGNOSIS — N1832 Chronic kidney disease, stage 3b: Secondary | ICD-10-CM

## 2024-05-01 DIAGNOSIS — I10 Essential (primary) hypertension: Secondary | ICD-10-CM

## 2024-05-01 DIAGNOSIS — I509 Heart failure, unspecified: Secondary | ICD-10-CM

## 2024-06-04 ENCOUNTER — Other Ambulatory Visit

## 2024-06-11 ENCOUNTER — Other Ambulatory Visit: Payer: Self-pay

## 2024-06-11 ENCOUNTER — Ambulatory Visit: Attending: Surgery | Admitting: Physical Therapy

## 2024-06-11 ENCOUNTER — Encounter: Payer: Self-pay | Admitting: Physical Therapy

## 2024-06-11 DIAGNOSIS — M62838 Other muscle spasm: Secondary | ICD-10-CM | POA: Diagnosis present

## 2024-06-11 DIAGNOSIS — M542 Cervicalgia: Secondary | ICD-10-CM | POA: Diagnosis present

## 2024-06-11 NOTE — Therapy (Signed)
 OUTPATIENT PHYSICAL THERAPY CERVICAL EVALUATION   Patient Name: Mitchell Parker Sr. MRN: [id number] DOB:Jun 27, 1951, 73 y.o., male Today's Date: 06/11/2024  END OF SESSION:  PT End of Session - 06/11/24 0931     Visit Number 1    Number of Visits 12    Date for PT Re-Evaluation 07/23/24    PT Start Time 0845    PT Stop Time 0931    PT Time Calculation (min) 46 min    Activity Tolerance Patient tolerated treatment well    Behavior During Therapy [facility] for tasks assessed/performed          Past Medical History:  Diagnosis Date   Chronic diarrhea    CKD (chronic kidney disease) stage 3, GFR 30-59 ml/min (HCC)    COPD (chronic obstructive pulmonary disease) (HCC)    Dysrhythmia 02/2022   paroxsymal SVT & aflutter 02/2022 XioXT   Essential hypertension    Fibromyalgia    Gallstones    Gout    History of CHF (congestive heart failure)    History of colonic polyps    History of DVT (deep vein thrombosis) 04/19/2012   BLE DVT 04/09/12   Iron deficiency 02/16/2023   Kidney stones    Lumbar disc disease    Neuropathy    Nonischemic cardiomyopathy (HCC) 2003   Minor coronary atherosclerosis at cardiac catheterization 2003, LVEF initially 20% with normalization   Obesity    Orthostatic hypotension    Presence of Watchman left atrial appendage closure device 02/02/2023   27mm Watchman placed by Dr. Arlester Ladd   RA (rheumatoid arthritis) [facility])    Sleep apnea    Subarachnoid hemorrhage (HCC) 02/03/2022   Traumatic   Subdural hematoma (HCC) 02/03/2022   Traumatic   Syncope, vasovagal    Type 2 diabetes mellitus (HCC)    Past Surgical History:  Procedure Laterality Date   ANTERIOR CERVICAL DECOMP/DISCECTOMY FUSION Left 03/28/2024   Procedure: CERVICAL FIVE-SIX ANTERIOR CERVICAL DECOMPRESSION/DISCECTOMY FUSION;  Surgeon: Van Gelinas, MD;  Location: [facility] OR;  Service: Neurosurgery;  Laterality: Left;  ACDF C56   BARIATRIC SURGERY  01/11/2018   BURR HOLE Right 05/04/2022    Procedure: BURR HOLES FOR Subdural Hematoma;  Surgeon: Van Gelinas, MD;  Location: [facility] OR;  Service: Neurosurgery;  Laterality: Right;   CHOLECYSTECTOMY  01/18/2012   Procedure: LAPAROSCOPIC CHOLECYSTECTOMY WITH INTRAOPERATIVE CHOLANGIOGRAM;  Surgeon: Brandy Cal. Cornett, MD;  Location: [facility] ORS;  Service: General;  Laterality: N/A;   COLONOSCOPY  05/2017   [facility]: five polyps removed but only 3 retrieved due to bowel prep, pathology with tubular adenomas   EYE SURGERY Right    Film over retina was removed.   I & D EXTREMITY Right 08/12/2023   Procedure: IRRIGATION AND DEBRIDEMENT RIGHT FOREARM;  Surgeon: Gillermo Lack, MD;  Location: [facility] OR;  Service: Orthopedics;  Laterality: Right;   LEFT ATRIAL APPENDAGE OCCLUSION N/A 02/02/2023   Procedure: LEFT ATRIAL APPENDAGE OCCLUSION;  Surgeon: Arnoldo Lapping, MD;  Location: [facility] INVASIVE CV LAB;  Service: Cardiovascular;  Laterality: N/A;   PILONIDAL CYST EXCISION  1997   TEE WITHOUT CARDIOVERSION N/A 02/02/2023   Procedure: TRANSESOPHAGEAL ECHOCARDIOGRAM;  Surgeon: Arnoldo Lapping, MD;  Location: [facility] INVASIVE CV LAB;  Service: Cardiovascular;  Laterality: N/A;   TOE AMPUTATION Right    3 toes on right foot- big toe and next 2   UMBILICAL HERNIA REPAIR     VARICOSE VEIN SURGERY     left leg   watchman implant  Patient Active Problem List   Diagnosis Date Noted   Wound infection 08/11/2023   Iron deficiency 02/16/2023   PAF (paroxysmal atrial fibrillation) (HCC) 02/02/2023   Atrial fibrillation (HCC) 02/02/2023   Presence of Watchman left atrial appendage closure device 02/02/2023   Diarrhea 11/16/2022   Anemia 11/16/2022   Subdural hematoma (HCC) 05/04/2022   SDH (subdural hematoma) (HCC) 02/03/2022   Right ear pain 07/13/2021   Venous insufficiency of right leg 08/19/2020   Thrombocytopenia (HCC) 09/08/2018   Chronic diastolic congestive heart failure (HCC) 08/17/2017   Hypokalemia 05/23/2012    Rheumatoid aortitis 05/23/2012   Pulmonary embolism (HCC) 04/19/2012   Chronic renal insufficiency 04/17/2012   Obesity, morbid (HCC) 01/16/2012   CHF (congestive heart failure) (HCC) 01/16/2012   COPD (chronic obstructive pulmonary disease) (HCC) 01/16/2012   OSA on CPAP 01/16/2012   Type 2 diabetes mellitus with stage 3b chronic kidney disease, without long-term current use of insulin  (HCC) 01/16/2012   Gout 01/04/2012   Hypertension 01/04/2012    REFERRING PROVIDER: Easter Golden   REFERRING DIAG: Radiculopathy, Cervical region.  THERAPY DIAG:  Cervicalgia - Plan: PT plan of care cert/re-cert  Other muscle spasm - Plan: PT plan of care cert/re-cert  Rationale for Evaluation and Treatment: Rehabilitation  ONSET DATE: ~December 2024.  SUBJECTIVE:                                                                                                                                                                                                         SUBJECTIVE STATEMENT: The patient presents to the clinic s/p ACDF C5-6 performed on 03/28/24.  He states the surgery has helped wit essentially eliminating his left UE symptoms.  He states his neck pain is less intense and frequent.  He does, however, continue to wear the soft collar as he experiences shooting pain when turning his head. He rates his pain at a 5-6/10 today and higher with movement.     PERTINENT HISTORY:  See above.    PAIN:  Are you having pain? Yes: NPRS scale: 5-6/10. Pain location: Bilateral neck right > left.   Pain description: Ache, shooting and stabbing.   Aggravating factors: Sitting without cushion/pillow behind neck. Relieving factors: Sitting with neck support.    PRECAUTIONS: Other: See cervical fusion protocol.    RED FLAGS: None   FALLS:  Has patient fallen in last 6 months? Yes. Number of falls 1.  LIVING ENVIRONMENT: Lives with: lives with their spouse Lives in: House/apartment Has following  equipment at home: None  OCCUPATION: Retired.  PLOF: Independent  PATIENT GOALS: Turn head without pain.     OBJECTIVE:   PATIENT SURVEYS:  NDI:  15/50.     POSTURE: rounded shoulders and forward head  PALPATION: Tender to palpation over patient's upper to mid-cervical paraspinal musculature, right > left with increased tone especially on right.    CERVICAL ROM:   Right active cervical rotation limited to 22 degrees and left to 25 degrees.    UPPER EXTREMITY ROM:  WF/NL's.    UPPER EXTREMITY MMT:  The patient demonstrates essentially normal bilateral UE strength.     TREATMENT DATE: Seated with pillow behind patient's head comfort and support with low-level IFC at 80-150 hz on 40% scan to patient's bilateral mid-upper cervical paraspinal musculature x 15 minutes  f/b gentle STW/M x 8 minutes right > left.      Normal modality response following removal of modality.                                                                                                                          PATIENT EDUCATION:  Education details:  Person educated:  International aid/development worker:  Education comprehension:   HOME EXERCISE PROGRAM:   ASSESSMENT:  CLINICAL IMPRESSION: The patient presents to OPPT s/p C5-6 ACDF performed on 03/28/24.  His CC his pain when he rotates his head left and right.  He exhibits palpable tenderness and tone over his upper to mid-cervical paraspinal musculature, right > left.  His bilateral cervical rotation is very limited.  His UE strength is essentially normal.  His NDI is 15/50. Patient will benefit from skilled physical therapy intervention to address pain and deficits.  OBJECTIVE IMPAIRMENTS: decreased activity tolerance, decreased ROM, postural dysfunction, and pain.   ACTIVITY LIMITATIONS: carrying, lifting, and sitting  PARTICIPATION LIMITATIONS: meal prep, cleaning, laundry, and yard work  [facility] POTENTIAL: Good  CLINICAL DECISION MAKING:  Stable/uncomplicated  EVALUATION COMPLEXITY: Low   GOALS:  SHORT TERM GOALS: Target date: 06/25/24.  Ind with an initial HEP.  Goal status: INITIAL  2.  Active bilateral cervical rotation improved to 35-40 degrees.  Goal status: INITIAL   LONG TERM GOALS: Target date: 07/23/24  Ind with an advanced HEP.  Goal status: INITIAL  2.  Improve bilateral active cervical rotation to 55 to 60 degrees so he can turn his head more easily while driving.  Goal status: INITIAL  3.  Perform ADL's with neck pain not > 3/10.  Goal status: INITIAL  4.  Improve NDI score to by 5 points.  Goal status: INITIAL  PLAN:  PT FREQUENCY: 2x/week  PT DURATION: 6 weeks  PLANNED INTERVENTIONS: 97110-Therapeutic exercises, 97530- Therapeutic activity, [id number]- Neuromuscular re-education, 97535- Self Care, 40981- Manual therapy, G0283- Electrical stimulation (unattended), 97035- Ultrasound, Patient/Family education, Cryotherapy, and Moist heat  PLAN FOR NEXT SESSION: Gentle STW/M and modalities as needed.  Postural exercises.  See cervical fusion protocol.     Ramond Darnell, [place], PT 06/11/2024, 10:29 AM

## 2024-06-12 NOTE — Progress Notes (Unsigned)
 06/13/2024 Name: Mitchell LINKER Sr. MRN: [id number] DOB: 06-Nov-1951  Chief Complaint  Patient presents with   Diabetes    Mitchell Parker. is a 73 y.o. year old male who presented for a telephone visit. I connected with  Mitchell GAROFANO Sr. on 06/13/24 by telephone and verified that I am speaking with the correct person using two identifiers. I discussed the limitations of evaluation and management by telemedicine. The patient expressed understanding and agreed to proceed.  Patient was located in his home and PharmD in PCP office during this visit.  They were referred to the pharmacist by their PCP for assistance in managing diabetes.   Care Team: Primary Care Provider: Roise Cleaver, MD ; Next Scheduled Visit: 07/29/24  Medication Access/Adherence  Current Pharmacy:  [facility] - [place], [place] - [address] [facility] [address] [place] [address] [place] [address] Phone: [phone] Fax: [phone]  [facility] - [place], [place] - [address] [place] [address] Phone: [phone] Fax: [phone]  [id number] [place], [place] - [address] [place] [address] Phone: [phone] Fax: [phone]   Patient reports affordability concerns with their medications: No  Patient reports access/transportation concerns to their pharmacy: No  Patient reports adherence concerns with their medications:  No    Subjective: Patient reports he is doing great on Ozempic  and has lost ~20 lbs. He has been taking the Ozempic  0.5 mg dose for about 2 months now and has about ~6 weeks of patient assistance supply left. Tolerating well and denies nausea, diarrhea, constipation, abdominal pain. Also started eating a low carb diet about 3-4 months ago.  Diabetes:  Current medications: pioglitazone  30 mg daily, Ozempic  0.5 mg weekly Medications tried in the past:  Mounjaro  (dc'd due to cost), glimepiride    Using glucometer; testing 1 time daily AM Fasting BG 100-110  Patient denies hypoglycemic s/sx including dizziness, shakiness, sweating. Patient denies hyperglycemic symptoms including polyuria, polydipsia, polyphagia, nocturia, neuropathy, blurred vision.  Current meal patterns:  - Eats a low carb diet for past 3-4 months, has cut out all bread -3 meals/day - Breakfast: eggs and sausage - Lunch: deli chicken roll up with cheese - Supper: barbecue chicken with salad - Snacks: does not snack - Drinks: zero sugar flavored water  Current physical activity: walks 1 mile three times per week   Current medication access support: Novo PAP for Ozempic   Objective:  Lab Results  Component Value Date   HGBA1C 5.5 04/22/2024    Lab Results  Component Value Date   CREATININE 1.09 04/22/2024   BUN 33 (H) 04/22/2024   NA 144 04/22/2024   K 4.4 04/22/2024   CL 106 04/22/2024   CO2 26 04/22/2024    Lab Results  Component Value Date   CHOL 109 04/22/2024   HDL 40 04/22/2024   LDLCALC 45 04/22/2024   TRIG 135 04/22/2024   CHOLHDL 2.7 04/22/2024    Medications Reviewed Today     Reviewed by Philmore Bream, RPH (Pharmacist) on 06/13/24 at 0925  Med List Status: <None>   Medication Order Taking? Sig Documenting Provider Last Dose Status Informant  acetaminophen  (TYLENOL ) 500 MG tablet [id number]  Take 500 mg by mouth every 6 (six) hours as needed for mild pain. [provider]  Active Self  allopurinol  (ZYLOPRIM ) 300 MG tablet [id number]  Take 1 tablet (300 mg  total) by mouth every morning. Roise Cleaver, MD  Active   amLODipine -olmesartan  (AZOR ) 10-40 MG tablet [id number]  TAKE 1 TABLET BY MOUTH DAILY Roise Cleaver, MD  Active   atorvastatin  (LIPITOR ) 80 MG tablet [id number]  Take 1 tablet (80 mg total) by mouth daily. Roise Cleaver, MD  Active   betamethasone  acetate-betamethasone  sodium phosphate  (CELESTONE ) injection 6 mg  [id number]   Stacks, Warren, MD  Active   calcitRIOL  (ROCALTROL ) 0.25 MCG capsule [id number]  Take 1 capsule (0.25 mcg total) by mouth daily with breakfast. Roise Cleaver, MD  Active   calcium  carbonate (OSCAL) 1500 (600 Ca) MG TABS tablet [id number]  Take 600 mg of elemental calcium  by mouth 2 (two) times daily with a meal. [provider]  Active Self  carvedilol  (COREG ) 6.25 MG tablet [id number]  TAKE ONE-HALF TABLET BY MOUTH  EVERY MORNING AND 1 TABLET BY  MOUTH EVERY Curtiss Dowdy, MD  Active   cloNIDine  (CATAPRES ) 0.2 MG tablet [id number]  TAKE 1 TABLET BY MOUTH TWICE  DAILY FOR BLOOD PRESSURE Roise Cleaver, MD  Active   cyclobenzaprine  (FLEXERIL ) 5 MG tablet [id number]  Take 1-2 tablets (5-10 mg total) by mouth 3 (three) times daily as needed for muscle spasms. Easter Golden Caylin, PA-C  Active   docusate sodium  (COLACE) 100 MG capsule [id number]  Take 1 capsule (100 mg total) by mouth 2 (two) times daily as needed for mild constipation or moderate constipation. Easter Golden Caylin, PA-C  Active   DULoxetine  (CYMBALTA ) 30 MG capsule [id number]  Take one daily for a week then DC the drug. Roise Cleaver, MD  Active   ferrous sulfate  325 (65 FE) MG tablet [id number]  Take 325 mg by mouth in the morning. [provider]  Active Self  fexofenadine -pseudoephedrine (ALLEGRA-D 24) 180-240 MG 24 hr tablet [id number]  Take 1 tablet by mouth every evening. For allergy and congestion Roise Cleaver, MD  Active Self  furosemide  (LASIX ) 40 MG tablet [id number]  Take 1 tablet (40 mg total) by mouth every morning. Roise Cleaver, MD  Active   hydroxychloroquine  (PLAQUENIL ) 200 MG tablet [id number]  Take 1 tablet (200 mg total) by mouth 2 (two) times daily. Roise Cleaver, MD  Active   Multiple Vitamin (MULTIVITAMIN WITH MINERALS) TABS tablet [id number]  Take 1 tablet by mouth in the morning. [provider]  Active Self  oxyCODONE -acetaminophen  (PERCOCET) 5-325 MG tablet [id number]   Take 1 tablet by mouth every 4 (four) hours as needed for severe pain (pain score 7-10). Easter Golden Caylin, PA-C  Active   pioglitazone  (ACTOS ) 30 MG tablet [id number] Yes Take 1 tablet (30 mg total) by mouth daily. Roise Cleaver, MD  Active   potassium chloride  SA (KLOR-CON  M) 20 MEQ tablet [id number]  TAKE 1 TABLET BY MOUTH DAILY FOR POTASSIUM REPLACEMENT/  SUPPLEMENT Roise Cleaver, MD  Active   pregabalin  (LYRICA ) 300 MG capsule [id number]  Take 1 capsule (300 mg total) by mouth at bedtime. For neck Roise Cleaver, MD  Active   Semaglutide ,0.25 or 0.5MG /DOS, (OZEMPIC , 0.25 OR 0.5 MG/DOSE,) 2 MG/3ML SOPN [id number] Yes Inject 0.5 mg into the skin once a week. Via novo nordisk patient assistance program Stacks, Vallorie Gayer, MD  Active   silver sulfADIAZINE (SILVADENE) 1 % cream [id number]  Apply topically as needed. [provider]  Active Self  tamsulosin  (FLOMAX ) 0.4 MG CAPS capsule [id number]  Take 2 capsules (0.8 mg total) by mouth at bedtime. Roise Cleaver, MD  Active   vitamin B-12 (CYANOCOBALAMIN ) 500 MCG tablet  [id number]  Take 500 mcg by mouth in the morning. [provider]  Active Self              Assessment/Plan:   Diabetes: - Currently controlled with last A1C 5.5%, below goal <7%. Great candidate for GLP-1 given T2DM and BMI 44. Appropriate to increase Ozempic  to maximize weight loss benefit as he is tolerating the medication well. Will submit dose change for Novo Nordisk PAP. Likely can discontinue pioglitazone  with increasing Ozempic  dose as he has great glycemic control and history of heart failure (although EF is recovered at this time).  - Reviewed long term cardiovascular and renal outcomes of uncontrolled blood sugar - Reviewed goal A1c, goal fasting, and goal 2 hour post prandial glucose - Reviewed dietary modifications including: encouraged him to continue adherence to lower carb diet - Reviewed lifestyle modifications including: recommended increasing  physical activity to at least 150 minutes per week - Recommend to increase Ozempic  to 1 mg weekly - Recommend to continue pioglitazone  30 mg daily for now.  - Patient denies personal or family history of multiple endocrine neoplasia type 2, medullary thyroid  cancer; personal history of pancreatitis or gallbladder disease. - Recommend to check fasting glucose daily - Approved for Novo PAP for Ozempic     Follow Up Plan: PCP on 07/29/24  Georga Killings, PharmD PGY-1 Pharmacy Resident  Marvell Slider, PharmD, BCACP, CPP Clinical Pharmacist, [facility]

## 2024-06-13 ENCOUNTER — Other Ambulatory Visit: Payer: Self-pay | Admitting: Family Medicine

## 2024-06-13 ENCOUNTER — Other Ambulatory Visit (INDEPENDENT_AMBULATORY_CARE_PROVIDER_SITE_OTHER): Admitting: Pharmacist

## 2024-06-13 ENCOUNTER — Telehealth: Payer: Self-pay | Admitting: Pharmacist

## 2024-06-13 ENCOUNTER — Telehealth: Payer: Self-pay | Admitting: *Deleted

## 2024-06-13 DIAGNOSIS — N184 Chronic kidney disease, stage 4 (severe): Secondary | ICD-10-CM

## 2024-06-13 DIAGNOSIS — E1122 Type 2 diabetes mellitus with diabetic chronic kidney disease: Secondary | ICD-10-CM

## 2024-06-13 DIAGNOSIS — Z7984 Long term (current) use of oral hypoglycemic drugs: Secondary | ICD-10-CM

## 2024-06-13 DIAGNOSIS — Z7985 Long-term (current) use of injectable non-insulin antidiabetic drugs: Secondary | ICD-10-CM

## 2024-06-13 MED ORDER — DEXCOM G7 RECEIVER DEVI: 99 refills | Status: DC

## 2024-06-13 MED ORDER — DEXCOM G7 SENSOR MISC: 3 refills | Status: DC

## 2024-06-13 NOTE — Telephone Encounter (Signed)
 LMOVM script sent to pharmacy

## 2024-06-13 NOTE — Telephone Encounter (Signed)
 Hey! Please submit dose change to Novo Nordisk for Ozempic  1 mg weekly. Thank you for your help!!

## 2024-06-13 NOTE — Telephone Encounter (Signed)
 Copied from CRM [id number]. Topic: Clinical - Prescription Issue >> Jun 13, 2024 10:01 AM Tiffany H wrote: Reason for CRM: Patient called to advise that [employer] is offering him a Glucose monitor. [facility] [place]. Patient advised that prescription just needs to be sent, no PA required. Please send prescription and follow up with patient to set an expectation about fulfillment time.  [phone] [place] [place] - [address] [place], [place] [address] Phone: [phone]  Fax: [phone] DEA #: [id number] DAW Reason: --

## 2024-06-17 ENCOUNTER — Encounter

## 2024-06-18 ENCOUNTER — Telehealth: Payer: Self-pay

## 2024-06-18 NOTE — Telephone Encounter (Signed)
 Copied from CRM [id number]. Topic: Clinical - Prescription Issue >> Jun 18, 2024  3:19 PM Everette C wrote: Reason for CRM: John Public librarian with [facility] has called to request that a prior authorization be completed for the patient's prescription for a Dexcom G7 glucose monitoring device. The urgency of this request has been stressed by both the patient and their insurance   Please contact Norleen further at [phone] if needed

## 2024-06-19 ENCOUNTER — Telehealth: Payer: Self-pay | Admitting: Pharmacy Technician

## 2024-06-19 ENCOUNTER — Encounter: Payer: Self-pay | Admitting: Hematology

## 2024-06-19 ENCOUNTER — Other Ambulatory Visit (HOSPITAL_COMMUNITY): Payer: Self-pay

## 2024-06-19 NOTE — Telephone Encounter (Signed)
 PA request has been Started. New Encounter has been or will be created for follow up. For additional info see Pharmacy Prior Auth telephone encounter from 06/19/2024.

## 2024-06-19 NOTE — Telephone Encounter (Signed)
 Pharmacy Patient Advocate Encounter   Received notification from Pt Calls Messages that prior authorization for Dexcom G7 Receiver device is required/requested.   Insurance verification completed.   The patient is insured through [employer] .   Per test claim: PA required; PA submitted to above mentioned insurance via CoverMyMeds Key/confirmation #/EOC [id number] Status is pending

## 2024-06-20 ENCOUNTER — Encounter: Payer: Self-pay | Admitting: *Deleted

## 2024-06-20 ENCOUNTER — Ambulatory Visit: Admitting: *Deleted

## 2024-06-20 DIAGNOSIS — M542 Cervicalgia: Secondary | ICD-10-CM

## 2024-06-20 DIAGNOSIS — M62838 Other muscle spasm: Secondary | ICD-10-CM

## 2024-06-20 NOTE — Telephone Encounter (Signed)
 Pharmacy Patient Advocate Encounter  Received notification from [facility] that Prior Authorization for Dexcom G7 Receiver device has been DENIED.  Full denial letter will be uploaded to the media tab. See denial reason below.   PA #/Case ID/Reference #:  [id number]

## 2024-06-20 NOTE — Telephone Encounter (Signed)
 Pt notified via MyChart. LS

## 2024-06-20 NOTE — Therapy (Signed)
 OUTPATIENT PHYSICAL THERAPY CERVICAL EVALUATION   Patient Name: Mitchell FIGG Sr. MRN: [id number] DOB:01-Jun-1951, 73 y.o., male Today's Date: 06/20/2024  END OF SESSION:  PT End of Session - 06/20/24 0846     Visit Number 2    Number of Visits 12    Date for PT Re-Evaluation 07/23/24    PT Start Time 0845    PT Stop Time 0935    PT Time Calculation (min) 50 min          Past Medical History:  Diagnosis Date   Chronic diarrhea    CKD (chronic kidney disease) stage 3, GFR 30-59 ml/min (HCC)    COPD (chronic obstructive pulmonary disease) (HCC)    Dysrhythmia 02/2022   paroxsymal SVT & aflutter 02/2022 XioXT   Essential hypertension    Fibromyalgia    Gallstones    Gout    History of CHF (congestive heart failure)    History of colonic polyps    History of DVT (deep vein thrombosis) 04/19/2012   BLE DVT 04/09/12   Iron deficiency 02/16/2023   Kidney stones    Lumbar disc disease    Neuropathy    Nonischemic cardiomyopathy (HCC) 2003   Minor coronary atherosclerosis at cardiac catheterization 2003, LVEF initially 20% with normalization   Obesity    Orthostatic hypotension    Presence of Watchman left atrial appendage closure device 02/02/2023   27mm Watchman placed by Dr. Wonda   RA (rheumatoid arthritis) [facility])    Sleep apnea    Subarachnoid hemorrhage (HCC) 02/03/2022   Traumatic   Subdural hematoma (HCC) 02/03/2022   Traumatic   Syncope, vasovagal    Type 2 diabetes mellitus (HCC)    Past Surgical History:  Procedure Laterality Date   ANTERIOR CERVICAL DECOMP/DISCECTOMY FUSION Left 03/28/2024   Procedure: CERVICAL FIVE-SIX ANTERIOR CERVICAL DECOMPRESSION/DISCECTOMY FUSION;  Surgeon: Debby Dorn MATSU, MD;  Location: [facility] OR;  Service: Neurosurgery;  Laterality: Left;  ACDF C56   BARIATRIC SURGERY  01/11/2018   BURR HOLE Right 05/04/2022   Procedure: BURR HOLES FOR Subdural Hematoma;  Surgeon: Debby Dorn MATSU, MD;  Location: [facility] OR;  Service: Neurosurgery;   Laterality: Right;   CHOLECYSTECTOMY  01/18/2012   Procedure: LAPAROSCOPIC CHOLECYSTECTOMY WITH INTRAOPERATIVE CHOLANGIOGRAM;  Surgeon: Debby LABOR. Cornett, MD;  Location: [facility] ORS;  Service: General;  Laterality: N/A;   COLONOSCOPY  05/2017   [facility]: five polyps removed but only 3 retrieved due to bowel prep, pathology with tubular adenomas   EYE SURGERY Right    Film over retina was removed.   I & D EXTREMITY Right 08/12/2023   Procedure: IRRIGATION AND DEBRIDEMENT RIGHT FOREARM;  Surgeon: Delene Marsa HERO, MD;  Location: [facility] OR;  Service: Orthopedics;  Laterality: Right;   LEFT ATRIAL APPENDAGE OCCLUSION N/A 02/02/2023   Procedure: LEFT ATRIAL APPENDAGE OCCLUSION;  Surgeon: Wonda Sharper, MD;  Location: [facility] INVASIVE CV LAB;  Service: Cardiovascular;  Laterality: N/A;   PILONIDAL CYST EXCISION  1997   TEE WITHOUT CARDIOVERSION N/A 02/02/2023   Procedure: TRANSESOPHAGEAL ECHOCARDIOGRAM;  Surgeon: Wonda Sharper, MD;  Location: [facility] INVASIVE CV LAB;  Service: Cardiovascular;  Laterality: N/A;   TOE AMPUTATION Right    3 toes on right foot- big toe and next 2   UMBILICAL HERNIA REPAIR     VARICOSE VEIN SURGERY     left leg   watchman implant     Patient Active Problem List   Diagnosis Date Noted   Wound infection 08/11/2023   Iron  deficiency 02/16/2023   PAF (paroxysmal atrial fibrillation) (HCC) 02/02/2023   Atrial fibrillation (HCC) 02/02/2023   Presence of Watchman left atrial appendage closure device 02/02/2023   Diarrhea 11/16/2022   Anemia 11/16/2022   Subdural hematoma (HCC) 05/04/2022   SDH (subdural hematoma) (HCC) 02/03/2022   Right ear pain 07/13/2021   Venous insufficiency of right leg 08/19/2020   Thrombocytopenia (HCC) 09/08/2018   Chronic diastolic congestive heart failure (HCC) 08/17/2017   Hypokalemia 05/23/2012   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  THERAPY DIAG:  Cervicalgia  Other muscle spasm  Rationale for Evaluation and Treatment: Rehabilitation  ONSET DATE: ~December 2024.  SUBJECTIVE:                                                                                                                                                                                                         SUBJECTIVE STATEMENT: The patient presents to the clinic s/p ACDF C5-6 performed on 03/28/24.  RT side pain is the worst  PERTINENT HISTORY:  See above.    PAIN:  Are you having pain? Yes: NPRS scale: 5-6/10. Pain location: Bilateral neck right > left.   Pain description: Ache, shooting and stabbing.   Aggravating factors: Sitting without cushion/pillow behind neck. Relieving factors: Sitting with neck support.    PRECAUTIONS: Other: See cervical fusion protocol.    RED FLAGS: None   FALLS:  Has patient fallen in last 6 months? Yes. Number of falls 1.  LIVING ENVIRONMENT: Lives with: lives with their spouse Lives in: House/apartment Has following equipment at home: None  OCCUPATION: Retired.    PLOF: Independent  PATIENT GOALS: Turn head without pain.     OBJECTIVE:   PATIENT SURVEYS:  NDI:  15/50.     POSTURE: rounded shoulders and forward head  PALPATION: Tender to palpation over patient's upper to mid-cervical paraspinal musculature, right > left with increased tone especially on right.    CERVICAL ROM:   Right active cervical rotation limited to 22 degrees and left to 25 degrees.    UPPER EXTREMITY  ROM:  WF/NL's.  UPPER EXTREMITY MMT:  The patient demonstrates essentially normal bilateral UE strength.     TREATMENT DATE:  06-20-24 Posture reeducation with scapular retractions 2x10 as well as discussed and reviewed sittind and standing postures and avoiding forward head and rounded shldr position as well as using pillows or table to prop up when reading Seated with pillow behind patient's head comfort and support with low-level IFC at 80-150 hz on 40% scan to patient's bilateral mid-upper cervical paraspinal musculature x 15 minutes   Gentle STW/M and TPR x 12 minutes right side UT, levator and cerv. Paras.    Normal modality response following removal of modality.                                                                                                                          PATIENT EDUCATION:  Education details:  Person educated:  International aid/development worker:  Education comprehension:   HOME EXERCISE PROGRAM:   ASSESSMENT:  CLINICAL IMPRESSION: The patient presents to OPPT s/p C5-6 ACDF performed on 03/28/24.  His CC his pain when he rotates right  6-7/10. Rx focused on posture education  in standing and sitting as well as re-ed with scapular retractions. STW/TPR to RT side UT , levator as well as cerv. Paras with good release, but very sensitive cerv. Paras C5-6. IFC end of session.    OBJECTIVE IMPAIRMENTS: decreased activity tolerance, decreased ROM, postural dysfunction, and pain.   ACTIVITY LIMITATIONS: carrying, lifting, and sitting  PARTICIPATION LIMITATIONS: meal prep, cleaning, laundry, and yard work  [facility] POTENTIAL: Good  CLINICAL DECISION MAKING: Stable/uncomplicated  EVALUATION COMPLEXITY: Low   GOALS:  SHORT TERM GOALS: Target date: 06/25/24.  Ind with an initial HEP.  Goal status: INITIAL  2.  Active bilateral cervical rotation improved to 35-40 degrees.  Goal status: INITIAL   LONG TERM GOALS: Target date: 07/23/24  Ind with an advanced HEP.   Goal status: INITIAL  2.  Improve bilateral active cervical rotation to 55 to 60 degrees so he can turn his head more easily while driving.  Goal status: INITIAL  3.  Perform ADL's with neck pain not > 3/10.  Goal status: INITIAL  4.  Improve NDI score to by 5 points.  Goal status: INITIAL  PLAN:  PT FREQUENCY: 2x/week  PT DURATION: 6 weeks  PLANNED INTERVENTIONS: 97110-Therapeutic exercises, 97530- Therapeutic activity, [id number]- Neuromuscular re-education, 97535- Self Care, [id number]- Manual therapy, [id number]- Electrical stimulation (unattended), 97035- Ultrasound, Patient/Family education, Cryotherapy, and Moist heat  PLAN FOR NEXT SESSION: Gentle STW/M and modalities as needed.  Postural exercises.  See cervical fusion protocol.     [place] Alfredo,CHRIS, PTA 06/20/2024, 9:50 AM

## 2024-06-21 ENCOUNTER — Telehealth: Payer: Self-pay | Admitting: Family Medicine

## 2024-06-21 NOTE — Telephone Encounter (Signed)
 Patient aware and verbalizes understanding that dexcom was denied.

## 2024-06-21 NOTE — Telephone Encounter (Signed)
 Copied from CRM [id number]. Topic: General - Other >> Jun 21, 2024  1:16 PM Turkey B wrote: Reason for CRM: pt returned call back about the Dexcom G7. Please cb

## 2024-06-24 ENCOUNTER — Encounter: Payer: Self-pay | Admitting: *Deleted

## 2024-06-24 ENCOUNTER — Other Ambulatory Visit (HOSPITAL_COMMUNITY): Payer: Self-pay

## 2024-06-24 ENCOUNTER — Ambulatory Visit: Admitting: *Deleted

## 2024-06-24 DIAGNOSIS — M62838 Other muscle spasm: Secondary | ICD-10-CM

## 2024-06-24 DIAGNOSIS — M542 Cervicalgia: Secondary | ICD-10-CM | POA: Diagnosis not present

## 2024-06-24 NOTE — Therapy (Signed)
 OUTPATIENT PHYSICAL THERAPY CERVICAL TREATMENT   Patient Name: Mitchell Parker. MRN: [id number] DOB:10-13-1951, 73 y.o., male Today's Date: 06/24/2024  END OF SESSION:  PT End of Session - 06/24/24 0759     Visit Number 3    Number of Visits 12    Date for PT Re-Evaluation 07/23/24    PT Start Time 0800    PT Stop Time 0902    PT Time Calculation (min) 62 min          Past Medical History:  Diagnosis Date   Chronic diarrhea    CKD (chronic kidney disease) stage 3, GFR 30-59 ml/min (HCC)    COPD (chronic obstructive pulmonary disease) (HCC)    Dysrhythmia 02/2022   paroxsymal SVT & aflutter 02/2022 XioXT   Essential hypertension    Fibromyalgia    Gallstones    Gout    History of CHF (congestive heart failure)    History of colonic polyps    History of DVT (deep vein thrombosis) 04/19/2012   BLE DVT 04/09/12   Iron deficiency 02/16/2023   Kidney stones    Lumbar disc disease    Neuropathy    Nonischemic cardiomyopathy (HCC) 2003   Minor coronary atherosclerosis at cardiac catheterization 2003, LVEF initially 20% with normalization   Obesity    Orthostatic hypotension    Presence of Watchman left atrial appendage closure device 02/02/2023   27mm Watchman placed by Dr. Wonda   RA (rheumatoid arthritis) [facility])    Sleep apnea    Subarachnoid hemorrhage (HCC) 02/03/2022   Traumatic   Subdural hematoma (HCC) 02/03/2022   Traumatic   Syncope, vasovagal    Type 2 diabetes mellitus (HCC)    Past Surgical History:  Procedure Laterality Date   ANTERIOR CERVICAL DECOMP/DISCECTOMY FUSION Left 03/28/2024   Procedure: CERVICAL FIVE-SIX ANTERIOR CERVICAL DECOMPRESSION/DISCECTOMY FUSION;  Surgeon: Debby Dorn MATSU, MD;  Location: [facility] OR;  Service: Neurosurgery;  Laterality: Left;  ACDF C56   BARIATRIC SURGERY  01/11/2018   BURR HOLE Right 05/04/2022   Procedure: BURR HOLES FOR Subdural Hematoma;  Surgeon: Debby Dorn MATSU, MD;  Location: [facility] OR;  Service: Neurosurgery;   Laterality: Right;   CHOLECYSTECTOMY  01/18/2012   Procedure: LAPAROSCOPIC CHOLECYSTECTOMY WITH INTRAOPERATIVE CHOLANGIOGRAM;  Surgeon: Debby LABOR. Cornett, MD;  Location: [facility] ORS;  Service: General;  Laterality: N/A;   COLONOSCOPY  05/2017   [facility]: five polyps removed but only 3 retrieved due to bowel prep, pathology with tubular adenomas   EYE SURGERY Right    Film over retina was removed.   I & D EXTREMITY Right 08/12/2023   Procedure: IRRIGATION AND DEBRIDEMENT RIGHT FOREARM;  Surgeon: Delene Marsa HERO, MD;  Location: [facility] OR;  Service: Orthopedics;  Laterality: Right;   LEFT ATRIAL APPENDAGE OCCLUSION N/A 02/02/2023   Procedure: LEFT ATRIAL APPENDAGE OCCLUSION;  Surgeon: Wonda Sharper, MD;  Location: [facility] INVASIVE CV LAB;  Service: Cardiovascular;  Laterality: N/A;   PILONIDAL CYST EXCISION  1997   TEE WITHOUT CARDIOVERSION N/A 02/02/2023   Procedure: TRANSESOPHAGEAL ECHOCARDIOGRAM;  Surgeon: Wonda Sharper, MD;  Location: [facility] INVASIVE CV LAB;  Service: Cardiovascular;  Laterality: N/A;   TOE AMPUTATION Right    3 toes on right foot- big toe and next 2   UMBILICAL HERNIA REPAIR     VARICOSE VEIN SURGERY     left leg   watchman implant     Patient Active Problem List   Diagnosis Date Noted   Wound infection 08/11/2023   Iron  deficiency 02/16/2023   PAF (paroxysmal atrial fibrillation) (HCC) 02/02/2023   Atrial fibrillation (HCC) 02/02/2023   Presence of Watchman left atrial appendage closure device 02/02/2023   Diarrhea 11/16/2022   Anemia 11/16/2022   Subdural hematoma (HCC) 05/04/2022   SDH (subdural hematoma) (HCC) 02/03/2022   Right ear pain 07/13/2021   Venous insufficiency of right leg 08/19/2020   Thrombocytopenia (HCC) 09/08/2018   Chronic diastolic congestive heart failure (HCC) 08/17/2017   Hypokalemia 05/23/2012   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  THERAPY DIAG:  Cervicalgia  Other muscle spasm  Rationale for Evaluation and Treatment: Rehabilitation  ONSET DATE: ~December 2024.  SUBJECTIVE:                                                                                                                                                                                                         SUBJECTIVE STATEMENT: The patient presents to the clinic s/p ACDF C5-6 performed on 03/28/24.  Sore after last Rx and was really hurting after sitting in church for 2 hours  PERTINENT HISTORY:  See above.    PAIN:  Are you having pain? Yes: NPRS scale: 5-6/10. Pain location: Bilateral neck right > left.   Pain description: Ache, shooting and stabbing.   Aggravating factors: Sitting without cushion/pillow behind neck. Relieving factors: Sitting with neck support.    PRECAUTIONS: Other: See cervical fusion protocol.    RED FLAGS: None   FALLS:  Has patient fallen in last 6 months? Yes. Number of falls 1.  LIVING ENVIRONMENT: Lives with: lives with their spouse Lives in: House/apartment Has following equipment at home: None  OCCUPATION: Retired.    PLOF: Independent  PATIENT GOALS: Turn head without pain.     OBJECTIVE:   PATIENT SURVEYS:  NDI:  15/50.     POSTURE: rounded shoulders and forward head  PALPATION: Tender to palpation over patient's upper to mid-cervical paraspinal musculature, right > left with increased tone especially on right.    CERVICAL ROM:   Right active cervical rotation limited to 22  degrees and left to 25 degrees.  UPPER EXTREMITY ROM:  WF/NL's.    UPPER EXTREMITY MMT:  The patient demonstrates essentially normal bilateral UE strength.     TREATMENT DATE:  06-24-24 UBE x 6 mins 120 RPMs Pulleys x 4 mins US  combo x 10 mins to RT side Utrap and cerv. Paras 1.5 w/cm2  Gentle STW/M and TPR right side UT, levator and cerv. Paras.    Normal modality response following removal of modality.   Seated with pillow behind patient's head comfort and support with low-level IFC at 80-150 hz on 40% scan to patient's bilateral mid-upper cervical paraspinal musculature x 20 minutes                                                                                                                           PATIENT EDUCATION:  Education details:  Person educated:  International aid/development worker:  Education comprehension:   HOME EXERCISE PROGRAM:   ASSESSMENT:  CLINICAL IMPRESSION: The patient presents to OPPT s/p C5-6 ACDF performed on 03/28/24.  He reports doing good after last Rx.  Rx focused on AAROM shldr exs f/b US  combo , STW, and IFC. Pt did very well with decreased pain after session. Good TP release today, but very sensitive cerv. Paras C5-6 still.  Add cervical isometrics to HEP next visit    OBJECTIVE IMPAIRMENTS: decreased activity tolerance, decreased ROM, postural dysfunction, and pain.   ACTIVITY LIMITATIONS: carrying, lifting, and sitting  PARTICIPATION LIMITATIONS: meal prep, cleaning, laundry, and yard work  [facility] POTENTIAL: Good  CLINICAL DECISION MAKING: Stable/uncomplicated  EVALUATION COMPLEXITY: Low   GOALS:  SHORT TERM GOALS: Target date: 06/25/24.  Ind with an initial HEP.  Goal status: INITIAL  2.  Active bilateral cervical rotation improved to 35-40 degrees.  Goal status: INITIAL   LONG TERM GOALS: Target date: 07/23/24  Ind with an advanced HEP.  Goal status: INITIAL  2.  Improve bilateral active cervical rotation to 55 to 60 degrees so he  can turn his head more easily while driving.  Goal status: INITIAL  3.  Perform ADL's with neck pain not > 3/10.  Goal status: INITIAL  4.  Improve NDI score to by 5 points.  Goal status: INITIAL  PLAN:  PT FREQUENCY: 2x/week  PT DURATION: 6 weeks  PLANNED INTERVENTIONS: 97110-Therapeutic exercises, 97530- Therapeutic activity, [id number]- Neuromuscular re-education, 97535- Self Care, [id number]- Manual therapy, [id number]- Electrical stimulation (unattended), 97035- Ultrasound, Patient/Family education, Cryotherapy, and Moist heat  PLAN FOR NEXT SESSION: Gentle STW/M and modalities as needed.  Postural exercises.  See cervical fusion protocol.  No modalities after 06-25-24    Petrita Blunck,CHRIS, PTA 06/24/2024, 12:54 PM

## 2024-06-27 ENCOUNTER — Ambulatory Visit: Attending: Surgery | Admitting: *Deleted

## 2024-06-27 ENCOUNTER — Encounter: Payer: Self-pay | Admitting: *Deleted

## 2024-06-27 DIAGNOSIS — M62838 Other muscle spasm: Secondary | ICD-10-CM | POA: Insufficient documentation

## 2024-06-27 DIAGNOSIS — M25512 Pain in left shoulder: Secondary | ICD-10-CM | POA: Insufficient documentation

## 2024-06-27 DIAGNOSIS — M542 Cervicalgia: Secondary | ICD-10-CM | POA: Diagnosis present

## 2024-06-27 NOTE — Therapy (Signed)
 OUTPATIENT PHYSICAL THERAPY CERVICAL TREATMENT   Patient Name: Mitchell KENDRA Sr. MRN: [id number] DOB:1951-06-30, 73 y.o., male Today's Date: 06/27/2024  END OF SESSION:  PT End of Session - 06/27/24 0757     Visit Number 4    Number of Visits 12    Date for PT Re-Evaluation 07/23/24    PT Start Time 0800    PT Stop Time 0849    PT Time Calculation (min) 49 min          Past Medical History:  Diagnosis Date   Chronic diarrhea    CKD (chronic kidney disease) stage 3, GFR 30-59 ml/min (HCC)    COPD (chronic obstructive pulmonary disease) (HCC)    Dysrhythmia 02/2022   paroxsymal SVT & aflutter 02/2022 XioXT   Essential hypertension    Fibromyalgia    Gallstones    Gout    History of CHF (congestive heart failure)    History of colonic polyps    History of DVT (deep vein thrombosis) 04/19/2012   BLE DVT 04/09/12   Iron deficiency 02/16/2023   Kidney stones    Lumbar disc disease    Neuropathy    Nonischemic cardiomyopathy (HCC) 2003   Minor coronary atherosclerosis at cardiac catheterization 2003, LVEF initially 20% with normalization   Obesity    Orthostatic hypotension    Presence of Watchman left atrial appendage closure device 02/02/2023   27mm Watchman placed by Dr. Wonda   RA (rheumatoid arthritis) [facility])    Sleep apnea    Subarachnoid hemorrhage (HCC) 02/03/2022   Traumatic   Subdural hematoma (HCC) 02/03/2022   Traumatic   Syncope, vasovagal    Type 2 diabetes mellitus (HCC)    Past Surgical History:  Procedure Laterality Date   ANTERIOR CERVICAL DECOMP/DISCECTOMY FUSION Left 03/28/2024   Procedure: CERVICAL FIVE-SIX ANTERIOR CERVICAL DECOMPRESSION/DISCECTOMY FUSION;  Surgeon: Debby Dorn MATSU, MD;  Location[facility] OR;  Service: Neurosurgery;  Laterality: Left;  ACDF C56   BARIATRIC SURGERY  01/11/2018   BURR HOLE Right 05/04/2022   Procedure: BURR HOLES FOR Subdural Hematoma;  Surgeon: Debby Dorn MATSU, MD;  Location: [facility] OR;  Service: Neurosurgery;   Laterality: Right;   CHOLECYSTECTOMY  01/18/2012   Procedure: LAPAROSCOPIC CHOLECYSTECTOMY WITH INTRAOPERATIVE CHOLANGIOGRAM;  Surgeon: Debby LABOR. Cornett, MD;  Location: [facility] ORS;  Service: General;  Laterality: N/A;   COLONOSCOPY  05/2017   [facility]: five polyps removed but only 3 retrieved due to bowel prep, pathology with tubular adenomas   EYE SURGERY Right    Film over retina was removed.   I & D EXTREMITY Right 08/12/2023   Procedure: IRRIGATION AND DEBRIDEMENT RIGHT FOREARM;  Surgeon: Delene Marsa HERO, MD;  Location: [facility] OR;  Service: Orthopedics;  Laterality: Right;   LEFT ATRIAL APPENDAGE OCCLUSION N/A 02/02/2023   Procedure: LEFT ATRIAL APPENDAGE OCCLUSION;  Surgeon: Wonda Sharper, MD;  Location: [facility] INVASIVE CV LAB;  Service: Cardiovascular;  Laterality: N/A;   PILONIDAL CYST EXCISION  1997   TEE WITHOUT CARDIOVERSION N/A 02/02/2023   Procedure: TRANSESOPHAGEAL ECHOCARDIOGRAM;  Surgeon: Wonda Sharper, MD;  Location: [facility] INVASIVE CV LAB;  Service: Cardiovascular;  Laterality: N/A;   TOE AMPUTATION Right    3 toes on right foot- big toe and next 2   UMBILICAL HERNIA REPAIR     VARICOSE VEIN SURGERY     left leg   watchman implant     Patient Active Problem List   Diagnosis Date Noted   Wound infection 08/11/2023   Iron  deficiency 02/16/2023   PAF (paroxysmal atrial fibrillation) (HCC) 02/02/2023   Atrial fibrillation (HCC) 02/02/2023   Presence of Watchman left atrial appendage closure device 02/02/2023   Diarrhea 11/16/2022   Anemia 11/16/2022   Subdural hematoma (HCC) 05/04/2022   SDH (subdural hematoma) (HCC) 02/03/2022   Right ear pain 07/13/2021   Venous insufficiency of right leg 08/19/2020   Thrombocytopenia (HCC) 09/08/2018   Chronic diastolic congestive heart failure (HCC) 08/17/2017   Hypokalemia 05/23/2012   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  THERAPY DIAG:  Cervicalgia  Other muscle spasm  Acute pain of left shoulder  Rationale for Evaluation and Treatment: Rehabilitation  ONSET DATE: ~December 2024.  SUBJECTIVE:                                                                                                                                                                                                         SUBJECTIVE STATEMENT:  Did okay after last Rx, but no better today. Pain 4/10  PERTINENT HISTORY:  See above.    PAIN:  Are you having pain? Yes: NPRS scale: 4/10. Pain location: Bilateral neck right > left.   Pain description: Ache, shooting and stabbing.   Aggravating factors: Sitting without cushion/pillow behind neck. Relieving factors: Sitting with neck support.    PRECAUTIONS: Other: See cervical fusion protocol.  ACDF C5-6 performed on 03/28/24.   RED FLAGS: None   FALLS:  Has patient fallen in last 6 months? Yes. Number of falls 1.  LIVING ENVIRONMENT: Lives with: lives with their spouse Lives in: House/apartment Has following equipment at home: None  OCCUPATION: Retired.    PLOF: Independent  PATIENT GOALS: Turn head without pain.     OBJECTIVE:   PATIENT SURVEYS:  NDI:  15/50.     POSTURE: rounded shoulders and forward head  PALPATION: Tender to palpation over patient's upper to mid-cervical paraspinal musculature, right > left with increased tone especially on right.    CERVICAL ROM:   Right active cervical rotation limited to 22 degrees and left to 25 degrees.     UPPER EXTREMITY  ROM:  WF/NL's.    UPPER EXTREMITY MMT:  The patient demonstrates essentially normal bilateral UE strength.     TREATMENT DATE:  06-24-24 UBE x 6 mins 120 RPMs Pulleys x 4 mins Green Rows 2x15    HEP 2x daily Arm raise 2x10    2xdaily   Bil Cervical isometrics for all motions except RT SB due to increased pain using RT UE Gentle STW/M and TPR right side UT, levator and cerv. Paras.    Normal modality response following removal of modality.                                                                                                                 PATIENT EDUCATION:  Education details:  Person educated:  International aid/development worker:  Education comprehension:   HOME EXERCISE PROGRAM:   ASSESSMENT:  CLINICAL IMPRESSION: MD 07-29-24 The patient reports having some relief with PT ,but same pain each morning. Rx focused on posture  and scapular stabilization exs with Tband rows and arm raise to 90 degrees only. Manual STW to Utrap and levator and Pt was instructed in self TPR using [employer]. Pt OOT x 2 weeks and was given handouts for HEP  OBJECTIVE IMPAIRMENTS: decreased activity tolerance, decreased ROM, postural dysfunction, and pain.   ACTIVITY LIMITATIONS: carrying, lifting, and sitting  PARTICIPATION LIMITATIONS: meal prep, cleaning, laundry, and yard work  [facility] POTENTIAL: Good  CLINICAL DECISION MAKING: Stable/uncomplicated  EVALUATION COMPLEXITY: Low   GOALS:  SHORT TERM GOALS: Target date: 06/25/24.  Ind with an initial HEP.  Goal status: INITIAL  2.  Active bilateral cervical rotation improved to 35-40 degrees.  Goal status: INITIAL   LONG TERM GOALS: Target date: 07/23/24  Ind with an advanced HEP.  Goal status:  MET  2.  Improve bilateral active cervical rotation to 55 to 60 degrees so he can turn his head more easily while driving.  Goal status: On going  3.  Perform ADL's with neck pain not > 3/10.  Goal status: On going  4.   Improve NDI score to by 5 points.  Goal status: On going  PLAN:  PT FREQUENCY: 2x/week  PT DURATION: 6 weeks  PLANNED INTERVENTIONS: 97110-Therapeutic exercises, 97530- Therapeutic activity, [id number]- Neuromuscular re-education, 97535- Self Care, [id number]- Manual therapy, [id number]- Electrical stimulation (unattended), 97035- Ultrasound, Patient/Family education, Cryotherapy, and Moist heat  PLAN FOR NEXT SESSION: Gentle STW/M and modalities as needed.  Postural exercises.  See cervical fusion protocol.  No modalities after 06-25-24    Brewer Hitchman,CHRIS, PTA 06/27/2024, 1:18 PM

## 2024-07-09 NOTE — Telephone Encounter (Signed)
 Faxed 1mg  dose increase to [facility] 07/09/24.

## 2024-07-10 ENCOUNTER — Telehealth: Payer: Self-pay

## 2024-07-10 NOTE — Telephone Encounter (Signed)
 Called to inform patient that ozempic  has arrived in office and is ready for pickup. Per DPR left detailed message on home phone.

## 2024-07-10 NOTE — Telephone Encounter (Signed)
 Copied from CRM [id number]. Topic: Clinical - Medication Question >> Jul 10, 2024  2:52 PM Tobias L wrote: Reason for CRM: Patient would like to speak to [facility] the pharmacist about his ozempic  dosage. Patient inquiring if he should continue the 0.5mg  dosage or if he should go to 1mg .  Patient requesting call back from [place], [phone]

## 2024-07-11 ENCOUNTER — Encounter: Payer: Self-pay | Admitting: Pharmacist

## 2024-07-11 NOTE — Telephone Encounter (Signed)
 Called patient at 9:12am no answer Left VM encouraging return call Will send my chart messsage

## 2024-07-11 NOTE — Telephone Encounter (Signed)
 Patient calling back to speak with Mliss. Was able to connect through [facility].

## 2024-07-16 ENCOUNTER — Encounter: Payer: Self-pay | Admitting: *Deleted

## 2024-07-16 ENCOUNTER — Ambulatory Visit: Admitting: *Deleted

## 2024-07-16 DIAGNOSIS — M62838 Other muscle spasm: Secondary | ICD-10-CM

## 2024-07-16 DIAGNOSIS — M542 Cervicalgia: Secondary | ICD-10-CM | POA: Diagnosis not present

## 2024-07-16 NOTE — Therapy (Addendum)
 OUTPATIENT PHYSICAL THERAPY CERVICAL TREATMENT   Patient Name: Mitchell HOLZHAUER Sr. MRN: [id number] DOB:Dec 03, 1951, 73 y.o., male Today's Date: 07/16/2024  END OF SESSION:  PT End of Session - 07/16/24 0756     Visit Number 5    Number of Visits 12    Date for PT Re-Evaluation 07/23/24    PT Start Time 0757    PT Stop Time 0845    PT Time Calculation (min) 48 min          Past Medical History:  Diagnosis Date   Chronic diarrhea    CKD (chronic kidney disease) stage 3, GFR 30-59 ml/min (HCC)    COPD (chronic obstructive pulmonary disease) (HCC)    Dysrhythmia 02/2022   paroxsymal SVT & aflutter 02/2022 XioXT   Essential hypertension    Fibromyalgia    Gallstones    Gout    History of CHF (congestive heart failure)    History of colonic polyps    History of DVT (deep vein thrombosis) 04/19/2012   BLE DVT 04/09/12   Iron deficiency 02/16/2023   Kidney stones    Lumbar disc disease    Neuropathy    Nonischemic cardiomyopathy (HCC) 2003   Minor coronary atherosclerosis at cardiac catheterization 2003, LVEF initially 20% with normalization   Obesity    Orthostatic hypotension    Presence of Watchman left atrial appendage closure device 02/02/2023   27mm Watchman placed by Dr. Wonda   RA (rheumatoid arthritis) [facility])    Sleep apnea    Subarachnoid hemorrhage (HCC) 02/03/2022   Traumatic   Subdural hematoma (HCC) 02/03/2022   Traumatic   Syncope, vasovagal    Type 2 diabetes mellitus (HCC)    Past Surgical History:  Procedure Laterality Date   ANTERIOR CERVICAL DECOMP/DISCECTOMY FUSION Left 03/28/2024   Procedure: CERVICAL FIVE-SIX ANTERIOR CERVICAL DECOMPRESSION/DISCECTOMY FUSION;  Surgeon: Debby Dorn MATSU, MD;  Location: [facility] OR;  Service: Neurosurgery;  Laterality: Left;  ACDF C56   BARIATRIC SURGERY  01/11/2018   BURR HOLE Right 05/04/2022   Procedure: BURR HOLES FOR Subdural Hematoma;  Surgeon: Debby Dorn MATSU, MD;  Location: [facility] OR;  Service: Neurosurgery;   Laterality: Right;   CHOLECYSTECTOMY  01/18/2012   Procedure: LAPAROSCOPIC CHOLECYSTECTOMY WITH INTRAOPERATIVE CHOLANGIOGRAM;  Surgeon: Debby LABOR. Cornett, MD;  Location: [facility] ORS;  Service: General;  Laterality: N/A;   COLONOSCOPY  05/2017   [facility]: five polyps removed but only 3 retrieved due to bowel prep, pathology with tubular adenomas   EYE SURGERY Right    Film over retina was removed.   I & D EXTREMITY Right 08/12/2023   Procedure: IRRIGATION AND DEBRIDEMENT RIGHT FOREARM;  Surgeon: Delene Marsa HERO, MD;  Location: [facility] OR;  Service: Orthopedics;  Laterality: Right;   LEFT ATRIAL APPENDAGE OCCLUSION N/A 02/02/2023   Procedure: LEFT ATRIAL APPENDAGE OCCLUSION;  Surgeon: Wonda Sharper, MD;  Location: [facility] INVASIVE CV LAB;  Service: Cardiovascular;  Laterality: N/A;   PILONIDAL CYST EXCISION  1997   TEE WITHOUT CARDIOVERSION N/A 02/02/2023   Procedure: TRANSESOPHAGEAL ECHOCARDIOGRAM;  Surgeon: Wonda Sharper, MD;  Location: [facility] INVASIVE CV LAB;  Service: Cardiovascular;  Laterality: N/A;   TOE AMPUTATION Right    3 toes on right foot- big toe and next 2   UMBILICAL HERNIA REPAIR     VARICOSE VEIN SURGERY     left leg   watchman implant     Patient Active Problem List   Diagnosis Date Noted   Wound infection 08/11/2023   Iron  deficiency 02/16/2023   PAF (paroxysmal atrial fibrillation) (HCC) 02/02/2023   Atrial fibrillation (HCC) 02/02/2023   Presence of Watchman left atrial appendage closure device 02/02/2023   Diarrhea 11/16/2022   Anemia 11/16/2022   Subdural hematoma (HCC) 05/04/2022   SDH (subdural hematoma) (HCC) 02/03/2022   Right ear pain 07/13/2021   Venous insufficiency of right leg 08/19/2020   Thrombocytopenia (HCC) 09/08/2018   Chronic diastolic congestive heart failure (HCC) 08/17/2017   Hypokalemia 05/23/2012   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  THERAPY DIAG:  Cervicalgia  Other muscle spasm  Rationale for Evaluation and Treatment: Rehabilitation  ONSET DATE: ~December 2024.  SUBJECTIVE:                                                                                                                                                                                                         SUBJECTIVE STATEMENT: Doing good. Ready to DC. Pain 1/10. Pain 5-6/10 when looking up. MD f/u 07-29-24  PERTINENT HISTORY:  See above.    PAIN:  Are you having pain? Yes: NPRS scale: 1/10. Pain location: Bilateral neck right > left.   Pain description: Ache, shooting and stabbing.   Aggravating factors: Sitting without cushion/pillow behind neck. Relieving factors: Sitting with neck support.    PRECAUTIONS: Other: See cervical fusion protocol.  ACDF C5-6 performed on 03/28/24.   RED FLAGS: None   FALLS:  Has patient fallen in last 6 months? Yes. Number of falls 1.  LIVING ENVIRONMENT: Lives with: lives with their spouse Lives in: House/apartment Has following equipment at home: None  OCCUPATION: Retired.    PLOF: Independent  PATIENT GOALS: Turn head without pain.     OBJECTIVE:   PATIENT SURVEYS:  NDI:  15/50.     POSTURE: rounded shoulders and forward head  PALPATION: Tender to palpation over patient's upper to mid-cervical paraspinal musculature, right > left with increased tone especially on right.    CERVICAL ROM:   Right active cervical rotation limited to 22 degrees and left to 25 degrees.    UPPER  EXTREMITY ROM:  WF/NL's.  UPPER EXTREMITY MMT:  The patient demonstrates essentially normal bilateral UE strength.     TREATMENT DATE:  07-16-24 UBE x 6 mins 90 RPMs Pulleys x Green Rows 2x15     Shldr ext [employer] 2x15 H-ABD 1x10 Cervical isometrics for all motions  Gentle STW/M and TPR right side UT, levator and cerv. Paras. With good release and 55 degrees RT rotation and LT 60 degrees    Normal modality response following removal of modality.                                                                                                                 PATIENT EDUCATION:  Education details:  Person educated:  International aid/development worker:  Education comprehension:   HOME EXERCISE PROGRAM:   ASSESSMENT:  CLINICAL IMPRESSION: MD 07-29-24 The patient reports doing good and ready for DC.  Rx focused on posture  and scapular stabilization exs with Tband rows and review of HEP as well as  Manual STW/ TPR to Utrap and levator with good release. ALL LTGs MET  OBJECTIVE IMPAIRMENTS: decreased activity tolerance, decreased ROM, postural dysfunction, and pain.   ACTIVITY LIMITATIONS: carrying, lifting, and sitting  PARTICIPATION LIMITATIONS: meal prep, cleaning, laundry, and yard work  [facility] POTENTIAL: Good  CLINICAL DECISION MAKING: Stable/uncomplicated  EVALUATION COMPLEXITY: Low   GOALS:  SHORT TERM GOALS: Target date: 06/25/24.  Ind with an initial HEP.  Goal status: MET  2.  Active bilateral cervical rotation improved to 35-40 degrees.  Goal status: MET   LONG TERM GOALS: Target date: 07/23/24  Ind with an advanced HEP.  Goal status:  MET  2.  Improve bilateral active cervical rotation to 55 to 60 degrees so he can turn his head more easily while driving.  Goal status:   MET  LT side 60 degrees RT side 55  3.  Perform ADL's with neck pain not > 3/10.  Goal status: MET  4.  Improve NDI score to by 5 points.  Goal status: MET     5/50  PLAN:  PT FREQUENCY:  2x/week  PT DURATION: 6 weeks  PLANNED INTERVENTIONS: 97110-Therapeutic exercises, 97530- Therapeutic activity, [id number]- Neuromuscular re-education, 97535- Self Care, 02859- Manual therapy, [id number]- Electrical stimulation (unattended), 97035- Ultrasound, Patient/Family education, Cryotherapy, and Moist heat  PLAN    DC to HEP    Mitchell Parker,CHRIS, PTA 07/16/2024, 8:49 AM   PHYSICAL THERAPY DISCHARGE SUMMARY  Visits from Start of Care: 5.  Current functional level related to goals / functional outcomes: See above.   Remaining deficits: See goal section.   Education / Equipment: HEP.   Patient agrees to discharge. Patient goals were met. Patient is being discharged due to meeting the stated rehab goals.    Chad Applegate MPT

## 2024-07-18 ENCOUNTER — Encounter

## 2024-07-29 ENCOUNTER — Encounter: Payer: Self-pay | Admitting: Family Medicine

## 2024-07-29 ENCOUNTER — Ambulatory Visit: Admitting: Family Medicine

## 2024-07-29 VITALS — BP 131/63 | HR 57 | Temp 97.0°F | Ht 73.0 in | Wt 341.2 lb

## 2024-07-29 DIAGNOSIS — I10 Essential (primary) hypertension: Secondary | ICD-10-CM | POA: Diagnosis not present

## 2024-07-29 DIAGNOSIS — E785 Hyperlipidemia, unspecified: Secondary | ICD-10-CM

## 2024-07-29 DIAGNOSIS — I480 Paroxysmal atrial fibrillation: Secondary | ICD-10-CM | POA: Diagnosis not present

## 2024-07-29 DIAGNOSIS — N184 Chronic kidney disease, stage 4 (severe): Secondary | ICD-10-CM

## 2024-07-29 DIAGNOSIS — E1169 Type 2 diabetes mellitus with other specified complication: Secondary | ICD-10-CM | POA: Diagnosis not present

## 2024-07-29 DIAGNOSIS — E1122 Type 2 diabetes mellitus with diabetic chronic kidney disease: Secondary | ICD-10-CM | POA: Diagnosis not present

## 2024-07-29 LAB — BAYER DCA HB A1C WAIVED: HB A1C (BAYER DCA - WAIVED): 6.1 % — ABNORMAL HIGH (ref 4.8–5.6)

## 2024-07-29 MED ORDER — OZEMPIC (1 MG/DOSE) 4 MG/3ML ~~LOC~~ SOPN: 1.0000 mg | PEN_INJECTOR | SUBCUTANEOUS | Status: AC

## 2024-07-29 NOTE — Progress Notes (Signed)
 Subjective:  Patient ID: Mitchell JONELLE Dann Chrystal.,  male    DOB: 1951/01/11  Age: 73 y.o.    CC: Medical Management of Chronic Issues   HPI Mitchell Parker. presents for  follow-up of hypertension. Patient has no history of headache chest pain or shortness of breath or recent cough. Patient also denies symptoms of TIA such as numbness weakness lateralizing. Patient denies side effects from medication. States taking it regularly.  Patient also  in for follow-up of elevated cholesterol. Doing well without complaints on current medication. Denies side effects  including myalgia and arthralgia and nausea. Also in today for liver function testing. Currently no chest pain, shortness of breath or other cardiovascular related symptoms noted.  Follow-up of diabetes. Patient does check blood sugar at home. Readings run between 100 and 120. Off of glimiperide. Pharm D increased his Ozempic  to 1 mg starting 2 weeks ago.  Patient denies symptoms such as excessive hunger or urinary frequency, excessive hunger, nausea No significant hypoglycemic spells noted. Following a No carb diet - eating vegetables, meats but no carb rich foods. Medications reviewed. Pt reports taking them regularly. Pt. denies complication/adverse reaction today.   Still having neck pain since surgery. Getting better though. Exercising still, until pain gets bad.    History Mitchell Parker has a past medical history of Chronic diarrhea, CKD (chronic kidney disease) stage 3, GFR 30-59 ml/min (HCC), COPD (chronic obstructive pulmonary disease) (HCC), Dysrhythmia (02/2022), Essential hypertension, Fibromyalgia, Gallstones, Gout, History of CHF (congestive heart failure), History of colonic polyps, History of DVT (deep vein thrombosis) (04/19/2012), Iron deficiency (02/16/2023), Kidney stones, Lumbar disc disease, Neuropathy, Nonischemic cardiomyopathy (HCC) (2003), Obesity, Orthostatic hypotension, Presence of Watchman left atrial appendage closure  device (02/02/2023), RA (rheumatoid arthritis) (HCC), Sleep apnea, Subarachnoid hemorrhage (HCC) (02/03/2022), Subdural hematoma (HCC) (02/03/2022), Syncope, vasovagal, and Type 2 diabetes mellitus (HCC).   He has a past surgical history that includes Umbilical hernia repair; Pilonidal cyst excision (1997); Varicose vein surgery; Cholecystectomy (01/18/2012); Bariatric Surgery (01/11/2018); Eye surgery (Right); [facility] (Right, 05/04/2022); Toe amputation (Right); LEFT ATRIAL APPENDAGE OCCLUSION (N/A, 02/02/2023); TEE without cardioversion (N/A, 02/02/2023); Colonoscopy (05/2017); watchman implant; I & D extremity (Right, 08/12/2023); and Anterior cervical decomp/discectomy fusion (Left, 03/28/2024).   His family history includes Diabetes in his brother and father; Heart disease in his father; Hypertension in his father and mother; Stroke in his mother.He reports that he quit smoking about 29 years ago. His smoking use included cigarettes. He started smoking about 54 years ago. He has a 50 pack-year smoking history. He has never used smokeless tobacco. He reports that he does not drink alcohol and does not use drugs.  Current Outpatient Medications on File Prior to Visit  Medication Sig Dispense Refill   acetaminophen  (TYLENOL ) 500 MG tablet Take 500 mg by mouth every 6 (six) hours as needed for mild pain.     allopurinol  (ZYLOPRIM ) 300 MG tablet Take 1 tablet (300 mg total) by mouth every morning. 100 tablet 0   amLODipine -olmesartan  (AZOR ) 10-40 MG tablet TAKE 1 TABLET BY MOUTH DAILY 100 tablet 0   atorvastatin  (LIPITOR ) 80 MG tablet Take 1 tablet (80 mg total) by mouth daily. 100 tablet 0   calcitRIOL  (ROCALTROL ) 0.25 MCG capsule Take 1 capsule (0.25 mcg total) by mouth daily with breakfast. 100 capsule 0   calcium  carbonate (OSCAL) 1500 (600 Ca) MG TABS tablet Take 600 mg of elemental calcium  by mouth 2 (two) times daily with a meal.  carvedilol  (COREG ) 6.25 MG tablet TAKE ONE-HALF TABLET BY MOUTH   EVERY MORNING AND 1 TABLET BY  MOUTH EVERY EVENING 150 tablet 1   cloNIDine  (CATAPRES ) 0.2 MG tablet TAKE 1 TABLET BY MOUTH TWICE  DAILY FOR BLOOD PRESSURE 200 tablet 1   furosemide  (LASIX ) 40 MG tablet Take 1 tablet (40 mg total) by mouth every morning. 100 tablet 0   hydroxychloroquine  (PLAQUENIL ) 200 MG tablet Take 1 tablet (200 mg total) by mouth 2 (two) times daily. 200 tablet 2   Multiple Vitamin (MULTIVITAMIN WITH MINERALS) TABS tablet Take 1 tablet by mouth in the morning.     potassium chloride  SA (KLOR-CON  M) 20 MEQ tablet TAKE 1 TABLET BY MOUTH DAILY FOR POTASSIUM REPLACEMENT/  SUPPLEMENT 100 tablet 0   silver sulfADIAZINE (SILVADENE) 1 % cream Apply topically as needed.     tamsulosin  (FLOMAX ) 0.4 MG CAPS capsule Take 2 capsules (0.8 mg total) by mouth at bedtime. 200 capsule 3   vitamin B-12 (CYANOCOBALAMIN ) 500 MCG tablet Take 500 mcg by mouth in the morning.     Current Facility-Administered Medications on File Prior to Visit  Medication Dose Route Frequency Provider Last Rate Last Admin   betamethasone  acetate-betamethasone  sodium phosphate  (CELESTONE ) injection 6 mg  6 mg Intramuscular Once         ROS Review of Systems  Constitutional:  Negative for fever.  Respiratory:  Negative for shortness of breath.   Cardiovascular:  Negative for chest pain.  Musculoskeletal:  Negative for arthralgias.  Skin:  Negative for rash.    Objective:  BP 131/63   Pulse (!) 57   Temp (!) 97 F (36.1 C)   Ht 6' 1 (1.854 m)   Wt (!) 341 lb 3.2 oz (154.8 kg)   SpO2 98%   BMI 45.02 kg/m   BP Readings from Last 3 Encounters:  07/29/24 131/63  04/22/24 123/66  03/28/24 (!) 165/63    Wt Readings from Last 3 Encounters:  07/29/24 (!) 341 lb 3.2 oz (154.8 kg)  04/22/24 (!) 340 lb (154.2 kg)  03/28/24 (!) 339 lb 4.6 oz (153.9 kg)    Lab Results  Component Value Date   HGBA1C 6.1 (H) 07/29/2024   HGBA1C 5.5 04/22/2024   HGBA1C 5.4 01/18/2024    Physical Exam Vitals  reviewed.  Constitutional:      Appearance: He is well-developed.  HENT:     Head: Normocephalic and atraumatic.     Right Ear: External ear normal.     Left Ear: External ear normal.     Mouth/Throat:     Pharynx: No oropharyngeal exudate or posterior oropharyngeal erythema.  Eyes:     Pupils: Pupils are equal, round, and reactive to light.  Cardiovascular:     Rate and Rhythm: Normal rate and regular rhythm.     Heart sounds: No murmur heard. Pulmonary:     Effort: No respiratory distress.     Breath sounds: Normal breath sounds.  Musculoskeletal:     Cervical back: Normal range of motion and neck supple.  Neurological:     Mental Status: He is alert and oriented to person, place, and time.         Assessment & Plan:  Type 2 diabetes mellitus with stage 4 chronic kidney disease, without long-term current use of insulin  (HCC) -     CBC with Differential/Platelet -     CMP14+EGFR -     Bayer DCA Hb A1c Waived -     Microalbumin /  creatinine urine ratio -     Bayer DCA Hb A1c Waived  Primary hypertension -     CMP14+EGFR  Paroxysmal atrial fibrillation (HCC)  Hyperlipidemia associated with type 2 diabetes mellitus (HCC)  Obesity, morbid (HCC)  Other orders -     Ozempic  (1 MG/DOSE); Inject 1 mg into the skin once a week.    Follow-up: Return in about 3 months (around 10/29/2024).  Butler Der, M.D.

## 2024-07-30 ENCOUNTER — Ambulatory Visit: Payer: Self-pay | Admitting: Family Medicine

## 2024-07-30 LAB — CBC WITH DIFFERENTIAL/PLATELET
Basophils Absolute: 0 x10E3/uL (ref 0.0–0.2)
Basos: 1 %
EOS (ABSOLUTE): 0.2 x10E3/uL (ref 0.0–0.4)
Eos: 4 %
Hematocrit: 41 % (ref 37.5–51.0)
Hemoglobin: 13.6 g/dL (ref 13.0–17.7)
Immature Grans (Abs): 0 x10E3/uL (ref 0.0–0.1)
Immature Granulocytes: 0 %
Lymphocytes Absolute: 0.9 x10E3/uL (ref 0.7–3.1)
Lymphs: 20 %
MCH: 31.9 pg (ref 26.6–33.0)
MCHC: 33.2 g/dL (ref 31.5–35.7)
MCV: 96 fL (ref 79–97)
Monocytes Absolute: 0.4 x10E3/uL (ref 0.1–0.9)
Monocytes: 9 %
Neutrophils Absolute: 3.2 x10E3/uL (ref 1.4–7.0)
Neutrophils: 66 %
Platelets: 115 x10E3/uL — ABNORMAL LOW (ref 150–450)
RBC: 4.26 x10E6/uL (ref 4.14–5.80)
RDW: 13.1 % (ref 11.6–15.4)
WBC: 4.8 x10E3/uL (ref 3.4–10.8)

## 2024-07-30 LAB — CMP14+EGFR
ALT: 47 IU/L — AB (ref 0–44)
AST: 42 IU/L — AB (ref 0–40)
Albumin: 3.8 g/dL (ref 3.8–4.8)
Alkaline Phosphatase: 55 IU/L (ref 44–121)
BUN/Creatinine Ratio: 22 (ref 10–24)
BUN: 25 mg/dL (ref 8–27)
Bilirubin Total: 0.5 mg/dL (ref 0.0–1.2)
CO2: 23 mmol/L (ref 20–29)
Calcium: 8.7 mg/dL (ref 8.6–10.2)
Chloride: 102 mmol/L (ref 96–106)
Creatinine, Ser: 1.12 mg/dL (ref 0.76–1.27)
Globulin, Total: 2.3 g/dL (ref 1.5–4.5)
Glucose: 112 mg/dL — AB (ref 70–99)
Potassium: 4.6 mmol/L (ref 3.5–5.2)
Sodium: 138 mmol/L (ref 134–144)
Total Protein: 6.1 g/dL (ref 6.0–8.5)
eGFR: 70 mL/min/1.73 (ref >59)

## 2024-07-30 LAB — MICROALBUMIN / CREATININE URINE RATIO
Creatinine, Urine: 14.3 mg/dL
Microalb/Creat Ratio: <21 mg/g{creat} (ref 0–29)
Microalbumin, Urine: <3 ug/mL

## 2024-07-30 NOTE — Progress Notes (Signed)
 Hello Dmetrius,  Your lab result is normal and/or stable.Some minor variations that are not significant are commonly marked abnormal, but do not represent any medical problem for you.  Best regards, [name], M.D.

## 2024-07-31 NOTE — Telephone Encounter (Signed)
 Emailed novo refill form to [employer].

## 2024-08-02 ENCOUNTER — Other Ambulatory Visit: Payer: Self-pay | Admitting: Family Medicine

## 2024-08-02 DIAGNOSIS — E1169 Type 2 diabetes mellitus with other specified complication: Secondary | ICD-10-CM

## 2024-08-02 DIAGNOSIS — E782 Mixed hyperlipidemia: Secondary | ICD-10-CM

## 2024-08-03 DIAGNOSIS — E1169 Type 2 diabetes mellitus with other specified complication: Secondary | ICD-10-CM | POA: Insufficient documentation

## 2024-08-16 ENCOUNTER — Inpatient Hospital Stay: Payer: No Typology Code available for payment source

## 2024-08-23 ENCOUNTER — Inpatient Hospital Stay: Payer: No Typology Code available for payment source | Admitting: Oncology

## 2024-08-24 ENCOUNTER — Other Ambulatory Visit: Payer: Self-pay | Admitting: Family Medicine

## 2024-08-24 DIAGNOSIS — I10 Essential (primary) hypertension: Secondary | ICD-10-CM

## 2024-08-30 ENCOUNTER — Telehealth: Payer: Self-pay | Admitting: Pharmacist

## 2024-08-30 NOTE — Telephone Encounter (Signed)
 08/30/2024 Name: Mitchell PEMBLETON Sr. MRN: [id number] DOB: [birth year]  Chief Complaint  Patient presents with   Medication Management    Kasper Mudrick. is a 73 y.o. year old male who presented for a telephone visit. I connected with  Mitchell NICKSON Sr. on 08/30/24 by telephone and verified that I am speaking with the correct person using two identifiers. I discussed the limitations of evaluation and management by telemedicine. The patient expressed understanding and agreed to proceed.  Patient was located in his home and PharmD in PCP office during this visit.  They were referred to the pharmacist by their PCP for assistance in managing diabetes.   Care Team: Primary Care Provider: Zollie Lowers, MD ; Next Scheduled Visit: 07/29/24  Medication Access/Adherence  Current Pharmacy:  [facility] - [place], [place] - [address] [facility] [address] [place] [address] [place] [address] Phone: [phone] Fax: [phone]  [facility] - [place], [place] - [address] [place] [address] Phone: [phone] Fax: [phone]  [id number] - [place], [place] - [address] [place] [address] Phone: [phone] Fax: [phone]   Patient reports affordability concerns with their medications: No  Patient reports access/transportation concerns to their pharmacy: No  Patient reports adherence concerns with their medications:  No    Subjective: Patient reports he is doing great on Ozempic  and has lost ~20 lbs. He has increased to Ozempic  1mg . Tolerating well and denies nausea, diarrhea, constipation, abdominal pain. Also started eating a low carb diet about 3-4 months ago.  Diabetes:  Current medications: pioglitazone  30 mg daily, Ozempic  1mg  weekly Medications tried in the past: Mounjaro  (dc'd due to cost), glimepiride , metformin    Using glucometer; testing 1  time daily AM Fasting BG 100-110  Patient denies hypoglycemic s/sx including dizziness, shakiness, sweating. Patient denies hyperglycemic symptoms including polyuria, polydipsia, polyphagia, nocturia, neuropathy, blurred vision.  Current meal patterns:  - Eats a low carb diet for past 3-4 months, has cut out all bread -3 meals/day - Breakfast: eggs and sausage - Lunch: deli chicken roll up with cheese - Supper: barbecue chicken with salad - Snacks: does not snack - Drinks: zero sugar flavored water  Current physical activity: walks 1 mile three times per week   Current medication access support: Novo PAP for Ozempic   Objective:  Lab Results  Component Value Date   HGBA1C 6.1 (H) 07/29/2024    Lab Results  Component Value Date   CREATININE 1.12 07/29/2024   BUN 25 07/29/2024   NA 138 07/29/2024   K 4.6 07/29/2024   CL 102 07/29/2024   CO2 23 07/29/2024    Lab Results  Component Value Date   CHOL 109 04/22/2024   HDL 40 04/22/2024   LDLCALC 45 04/22/2024   TRIG 135 04/22/2024   CHOLHDL 2.7 04/22/2024    Medications Reviewed Today     Reviewed by Billee Mliss BIRCH, [facility] (Pharmacist) on 08/30/24 at 1255  Med List Status: <None>   Medication Order Taking? Sig Documenting Provider Last Dose Status Informant  acetaminophen  (TYLENOL ) 500 MG tablet [id number] No Take 500 mg by mouth every 6 (six) hours as needed for mild pain. [provider] Taking Active Self  allopurinol  (ZYLOPRIM ) 300 MG tablet [id number]  Take 1 tablet (300 mg total) by mouth every morning. Zollie Lowers, MD  Active   amLODipine -olmesartan  (AZOR ) 10-40 MG tablet  [id number]  TAKE 1 TABLET BY MOUTH DAILY Zollie Lowers, MD  Active   atorvastatin  (LIPITOR ) 80 MG tablet [id number]  TAKE 1 TABLET BY MOUTH DAILY Stacks, Lowers, MD  Active   betamethasone  acetate-betamethasone  sodium phosphate  (CELESTONE ) injection 6 mg [id number]   Stacks, [place], MD  Active   calcitRIOL  (ROCALTROL ) 0.25 MCG capsule  [id number]  TAKE 1 CAPSULE BY MOUTH DAILY  WITH OFILIA Zollie Lowers, MD  Active   calcium  carbonate (OSCAL) 1500 (600 Ca) MG TABS tablet [id number] No Take 600 mg of elemental calcium  by mouth 2 (two) times daily with a meal. [provider] Taking Active Self  carvedilol  (COREG ) 6.25 MG tablet [id number]  TAKE ONE-HALF TABLET BY MOUTH  EVERY MORNING AND 1 TABLET BY  MOUTH EVERY KARNA Zollie Lowers, MD  Active   cloNIDine  (CATAPRES ) 0.2 MG tablet [id number]  TAKE 1 TABLET BY MOUTH TWICE  DAILY FOR BLOOD PRESSURE Zollie Lowers, MD  Active   furosemide  (LASIX ) 40 MG tablet [id number]  Take 1 tablet (40 mg total) by mouth every morning. Zollie Lowers, MD  Active   hydroxychloroquine  (PLAQUENIL ) 200 MG tablet [id number]  Take 1 tablet (200 mg total) by mouth 2 (two) times daily. Zollie Lowers, MD  Active   Multiple Vitamin (MULTIVITAMIN WITH MINERALS) TABS tablet [id number] No Take 1 tablet by mouth in the morning. [provider] Taking Active Self  potassium chloride  SA (KLOR-CON  M) 20 MEQ tablet [id number]  TAKE 1 TABLET BY MOUTH DAILY FOR POTASSIUM REPLACEMENT/  SUPPLEMENT Stacks, Lowers, MD  Active   Semaglutide , 1 MG/DOSE, (OZEMPIC , 1 MG/DOSE,) 4 MG/3ML SOPN [id number]  Inject 1 mg into the skin once a week. Zollie Lowers, MD  Active            Med Note TENA, MLISS BIRCH   Fri Aug 30, 2024 12:40 PM) Via novo nordisk patient assistance program    silver sulfADIAZINE (SILVADENE) 1 % cream [id number] No Apply topically as needed. [provider] Taking Active Self  tamsulosin  (FLOMAX ) 0.4 MG CAPS capsule [id number]  Take 2 capsules (0.8 mg total) by mouth at bedtime. Zollie Lowers, MD  Active   vitamin B-12 (CYANOCOBALAMIN ) 500 MCG tablet [id number] No Take 500 mcg by mouth in the morning. [provider] Taking Active Self             Assessment/Plan:   Diabetes: - Currently controlled with last A1C 5.5%, below goal <7%. Great candidate for GLP-1 given  T2DM and BMI 44. Appropriate to increase Ozempic  to 1mg  to maximize weight loss benefit as he is tolerating the medication well. . Likely can discontinue pioglitazone  with increasing Ozempic  dose as he has great glycemic control and history of heart failure (although EF is recovered at this time).  - Reviewed long term cardiovascular and renal outcomes of uncontrolled blood sugar - Reviewed goal A1c, goal fasting, and goal 2 hour post prandial glucose - Reviewed dietary modifications including: encouraged him to continue adherence to lower carb diet - Reviewed lifestyle modifications including: recommended increasing physical activity to at least 150 minutes per week - Recommend to continue Ozempic  to 1 mg weekly - Recommend to DISCONTINUE pioglitazone  30 mg daily  - Patient denies personal or family history of multiple endocrine neoplasia type 2, medullary thyroid  cancer; personal history of pancreatitis or gallbladder disease. - Recommend to check fasting glucose daily - Approved for Novo PAP for Ozempic --shipment arrived, patient notified   MLISS Tarry Griffin, PharmD, BCACP, CPP Clinical Pharmacist, [facility]

## 2024-09-24 ENCOUNTER — Telehealth: Payer: Self-pay

## 2024-09-24 NOTE — Telephone Encounter (Signed)
   In process of completing Novo Nordisk refills for patients OZEMPIC  medication.  Application emailed to JULIE P for signature.

## 2024-10-09 NOTE — Telephone Encounter (Signed)
 1mg  weekly Thank you!

## 2024-10-09 NOTE — Telephone Encounter (Addendum)
 Rec'd signed refill form.   No medication was checked off.  Will he be on the 1mg  or 2mg  dose pens?!

## 2024-10-09 NOTE — Telephone Encounter (Signed)
 Faxed completed refill form to novo nordisk.

## 2024-10-14 ENCOUNTER — Other Ambulatory Visit: Payer: Self-pay | Admitting: Family Medicine

## 2024-10-14 DIAGNOSIS — I10 Essential (primary) hypertension: Secondary | ICD-10-CM

## 2024-10-14 DIAGNOSIS — E1122 Type 2 diabetes mellitus with diabetic chronic kidney disease: Secondary | ICD-10-CM

## 2024-10-14 DIAGNOSIS — I509 Heart failure, unspecified: Secondary | ICD-10-CM

## 2024-10-17 ENCOUNTER — Ambulatory Visit: Attending: Cardiology | Admitting: Cardiology

## 2024-10-17 ENCOUNTER — Encounter: Payer: Self-pay | Admitting: Family Medicine

## 2024-10-17 ENCOUNTER — Encounter: Payer: Self-pay | Admitting: Cardiology

## 2024-10-17 VITALS — BP 140/70 | HR 68 | Ht 73.0 in | Wt 323.6 lb

## 2024-10-17 DIAGNOSIS — R931 Abnormal findings on diagnostic imaging of heart and coronary circulation: Secondary | ICD-10-CM | POA: Diagnosis not present

## 2024-10-17 DIAGNOSIS — Z95818 Presence of other cardiac implants and grafts: Secondary | ICD-10-CM

## 2024-10-17 DIAGNOSIS — I5020 Unspecified systolic (congestive) heart failure: Secondary | ICD-10-CM

## 2024-10-17 DIAGNOSIS — I483 Typical atrial flutter: Secondary | ICD-10-CM | POA: Diagnosis not present

## 2024-10-17 DIAGNOSIS — I10 Essential (primary) hypertension: Secondary | ICD-10-CM | POA: Diagnosis not present

## 2024-10-17 NOTE — Patient Instructions (Signed)
 Medication Instructions:  Your physician recommends that you continue on your current medications as directed. Please refer to the Current Medication list given to you today.   Labwork: None today  Testing/Procedures: None today  Follow-Up: 6 months  Any Other Special Instructions Will Be Listed Below (If Applicable).  If you need a refill on your cardiac medications before your next appointment, please call your pharmacy.

## 2024-10-17 NOTE — Progress Notes (Signed)
 Cardiology Office Note  Date: 10/17/2024   ID: Mitchell Sobotka., DOB 1951/04/04, MRN [id number]  History of Present Illness: Mitchell Parker. is a 73 y.o. male last seen in February.  He is here for a follow-up visit.  He does not report any exertional chest pain, stable NYHA class II dyspnea.  Walking for exercise 3 days a week.  He has also been cutting back carbohydrates in his diet and has lost weight on Ozempic .  He underwent cervical spine surgery in April, no obvious perioperative cardiac complications.  He continues to follow with Dr. Zollie and also at the [facility] clinic.  He had a cardiovascular evaluation including an echocardiogram showing normal LVEF.  I reviewed his medications.  He reports compliance with therapy.  I rechecked his blood pressure today at 140/70.  His most recent LDL was 45.  Physical Exam: VS:  BP (!) 140/70 (BP Location: Left Arm)   Pulse 68   Ht 6' 1 (1.854 m)   Wt (!) 323 lb 9.6 oz (146.8 kg)   SpO2 98%   BMI 42.69 kg/m , BMI Body mass index is 42.69 kg/m.  Wt Readings from Last 3 Encounters:  10/17/24 (!) 323 lb 9.6 oz (146.8 kg)  07/29/24 (!) 341 lb 3.2 oz (154.8 kg)  04/22/24 (!) 340 lb (154.2 kg)    General: Patient appears comfortable at rest. HEENT: Conjunctiva and lids normal. Neck: Supple, no elevated JVP or carotid bruits. Lungs: Clear to auscultation, nonlabored breathing at rest. Cardiac: Regular rate and rhythm, no S3 or significant systolic murmur.  ECG:  An ECG dated 02/07/2024 was personally reviewed today and demonstrated:  Sinus bradycardia with prolonged PR interval and IVCD.  Labwork: 07/29/2024: ALT 47; AST 42; BUN 25; Creatinine, Ser 1.12; Hemoglobin 13.6; Platelets 115; Potassium 4.6; Sodium 138     Component Value Date/Time   CHOL 109 04/22/2024 0916   TRIG 135 04/22/2024 0916   HDL 40 04/22/2024 0916   CHOLHDL 2.7 04/22/2024 0916   CHOLHDL 11.7 10/22/2011 1030   VLDL 39 10/22/2011 1030   LDLCALC 45  04/22/2024 0916   Other Studies Reviewed Today:  No interval cardiac testing for review today.  Assessment and Plan:  1.  Paroxysmal atrial flutter with CHA2DS2-VASc score of 5.  He is status post Watchman implantation in February 2024 (prior history of traumatic subdural hematomas and subarachnoid hemorrhage while not anticoagulated).  No interval palpitations and heart rate regular today.  He is not on aspirin  or anticoagulation.  Continue observation.   2.  HFrecEF with history of nonischemic cardiomyopathy,LVEF 55 to 60% by TEE in February 2024.  Reportedly underwent echocardiogram through the [facility] clinic this year with normal LVEF as well.  He reports NYHA class II dyspnea, no fluid retention.  Current medications include olmesartan  40 mg daily, Coreg  6.25 mg twice daily, Lasix  40 mg daily with potassium supplement, and Ozempic  as part of his diabetic regimen.   3.  Primary hypertension.  No changes made today.  Continue Azor  10/40 mg daily, Coreg  6.25 mg twice daily, and clonidine  0.2 mg twice daily.   4.  Coronary calcium  score 4417 with multivessel coronary atherosclerosis by cardiac CT in January 2024.  No angina.  Continue Lipitor  80 mg daily.  LDL 44 in April.   5.  Type 2 diabetes mellitus.  Continues to follow with Dr. Zollie.  Hemoglobin A1c 6.1% most recently.  He is on Ozempic  and concurrently losing weight.  Disposition:  Follow up 6 months.  Signed, Mitchell Parker, M.D., F.A.C.C.  [facility] at [facility]

## 2024-10-22 ENCOUNTER — Ambulatory Visit: Payer: Medicare Other

## 2024-10-22 VITALS — BP 140/70 | HR 68 | Ht 73.0 in | Wt 323.0 lb

## 2024-10-22 DIAGNOSIS — Z0000 Encounter for general adult medical examination without abnormal findings: Secondary | ICD-10-CM | POA: Diagnosis not present

## 2024-10-22 NOTE — Progress Notes (Signed)
 Subjective:   Mitchell Parker. is a 73 y.o. who presents for a Medicare Wellness preventive visit.  As a reminder, Annual Wellness Visits don't include a physical exam, and some assessments may be limited, especially if this visit is performed virtually. We may recommend an in-person follow-up visit with your provider if needed.  Visit Complete: Virtual I connected with  Mitchell CARRIER Sr. on 10/22/24 by a audio enabled telemedicine application and verified that I am speaking with the correct person using two identifiers.  Patient Location: Home  Provider Location: Home Office  I discussed the limitations of evaluation and management by telemedicine. The patient expressed understanding and agreed to proceed.  Vital Signs: Because this visit was a virtual/telehealth visit, some criteria may be missing or patient reported. Any vitals not documented were not able to be obtained and vitals that have been documented are patient reported.  VideoDeclined- This patient declined Librarian, academic. Therefore the visit was completed with audio only.  Persons Participating in Visit: Patient.  AWV Questionnaire: No: Patient Medicare AWV questionnaire was not completed prior to this visit.  Cardiac Risk Factors include: advanced age (>79men, >62 women);diabetes mellitus;dyslipidemia;hypertension;male gender;obesity (BMI >30kg/m2)     Objective:    Today's Vitals   10/22/24 1426  BP: (!) 140/70  Pulse: 68  Weight: (!) 323 lb (146.5 kg)  Height: 6' 1 (1.854 m)   Body mass index is 42.61 kg/m.     10/22/2024    2:31 PM 06/11/2024    9:10 AM 03/28/2024    7:50 AM 02/23/2024    9:09 AM 02/07/2024    9:37 AM 10/19/2023    3:53 PM 08/18/2023    8:13 AM  Advanced Directives  Does Patient Have a Medical Advance Directive? Yes Yes Yes No No Yes No  Type of Advance Directive Living will  Living will   Healthcare Power of [place];Living will   Copy of Healthcare  Power of Attorney in Chart?      No - copy requested   Would patient like information on creating a medical advance directive?    No - Patient declined No - Patient declined  No - Patient declined    Current Medications (verified) Outpatient Encounter Medications as of 10/22/2024  Medication Sig   acetaminophen  (TYLENOL ) 500 MG tablet Take 500 mg by mouth every 6 (six) hours as needed for mild pain.   allopurinol  (ZYLOPRIM ) 300 MG tablet Take 1 tablet (300 mg total) by mouth every morning.   amLODipine -olmesartan  (AZOR ) 10-40 MG tablet TAKE 1 TABLET BY MOUTH DAILY   atorvastatin  (LIPITOR ) 80 MG tablet TAKE 1 TABLET BY MOUTH DAILY   calcitRIOL  (ROCALTROL ) 0.25 MCG capsule TAKE 1 CAPSULE BY MOUTH DAILY  WITH BREAKFAST   calcium  carbonate (OSCAL) 1500 (600 Ca) MG TABS tablet Take 600 mg of elemental calcium  by mouth 2 (two) times daily with a meal.   carvedilol  (COREG ) 6.25 MG tablet TAKE ONE-HALF TABLET BY MOUTH  EVERY MORNING AND 1 TABLET BY  MOUTH EVERY EVENING   cloNIDine  (CATAPRES ) 0.2 MG tablet TAKE 1 TABLET BY MOUTH TWICE  DAILY FOR BLOOD PRESSURE   furosemide  (LASIX ) 40 MG tablet Take 1 tablet (40 mg total) by mouth every morning.   Multiple Vitamin (MULTIVITAMIN WITH MINERALS) TABS tablet Take 1 tablet by mouth in the morning.   potassium chloride  SA (KLOR-CON  M) 20 MEQ tablet TAKE 1 TABLET BY MOUTH DAILY FOR POTASSIUM REPLACEMENT/  SUPPLEMENT   Semaglutide , 1  MG/DOSE, (OZEMPIC , 1 MG/DOSE,) 4 MG/3ML SOPN Inject 1 mg into the skin once a week.   silver sulfADIAZINE (SILVADENE) 1 % cream Apply topically as needed.   tamsulosin  (FLOMAX ) 0.4 MG CAPS capsule Take 2 capsules (0.8 mg total) by mouth at bedtime.   vitamin B-12 (CYANOCOBALAMIN ) 500 MCG tablet Take 500 mcg by mouth in the morning.   [DISCONTINUED] hydroxychloroquine  (PLAQUENIL ) 200 MG tablet Take 1 tablet (200 mg total) by mouth 2 (two) times daily.   Facility-Administered Encounter Medications as of 10/22/2024  Medication    betamethasone  acetate-betamethasone  sodium phosphate  (CELESTONE ) injection 6 mg    Allergies (verified) Ciprofloxacin, Definity [perflutren lipid microsphere], and Levofloxacin [levofloxacin]   History: Past Medical History:  Diagnosis Date   Allergy oct 2002   Cataract 2013   CHF (congestive heart failure) (HCC) 2002   Chronic diarrhea    CKD (chronic kidney disease) stage 3, GFR 30-59 ml/min (HCC)    COPD (chronic obstructive pulmonary disease) (HCC)    Dysrhythmia 02/2022   paroxsymal SVT & aflutter 02/2022 XioXT   Essential hypertension    Fibromyalgia    Gallstones    Gout    History of CHF (congestive heart failure)    History of colonic polyps    History of DVT (deep vein thrombosis) 04/19/2012   BLE DVT 04/09/12   Iron deficiency 02/16/2023   Kidney stones    Lumbar disc disease    Neuropathy    Nonischemic cardiomyopathy (HCC) 2003   Minor coronary atherosclerosis at cardiac catheterization 2003, LVEF initially 20% with normalization   Obesity    Orthostatic hypotension    Presence of Watchman left atrial appendage closure device 02/02/2023   27mm Watchman placed by Dr. Wonda   RA (rheumatoid arthritis) [facility])    Sleep apnea    Subarachnoid hemorrhage (HCC) 02/03/2022   Traumatic   Subdural hematoma (HCC) 02/03/2022   Traumatic   Syncope, vasovagal    Type 2 diabetes mellitus (HCC)    Past Surgical History:  Procedure Laterality Date   ANTERIOR CERVICAL DECOMP/DISCECTOMY FUSION Left 03/28/2024   Procedure: CERVICAL FIVE-SIX ANTERIOR CERVICAL DECOMPRESSION/DISCECTOMY FUSION;  Surgeon: Debby Dorn MATSU, MD;  Location: [facility] OR;  Service: Neurosurgery;  Laterality: Left;  ACDF C56   BARIATRIC SURGERY  01/11/2018   BRAIN SURGERY     BURR HOLE Right 05/04/2022   Procedure: BURR HOLES FOR Subdural Hematoma;  Surgeon: Debby Dorn MATSU, MD;  Location: [facility] OR;  Service: Neurosurgery;  Laterality: Right;   CHOLECYSTECTOMY  01/18/2012   Procedure: LAPAROSCOPIC  CHOLECYSTECTOMY WITH INTRAOPERATIVE CHOLANGIOGRAM;  Surgeon: Debby LABOR. Cornett, MD;  Location: [facility] ORS;  Service: General;  Laterality: N/A;   COLONOSCOPY  05/2017   [facility]: five polyps removed but only 3 retrieved due to bowel prep, pathology with tubular adenomas   EYE SURGERY Right    Film over retina was removed.   HERNIA REPAIR     I & D EXTREMITY Right 08/12/2023   Procedure: IRRIGATION AND DEBRIDEMENT RIGHT FOREARM;  Surgeon: Delene Marsa HERO, MD;  Location: [facility] OR;  Service: Orthopedics;  Laterality: Right;   LEFT ATRIAL APPENDAGE OCCLUSION N/A 02/02/2023   Procedure: LEFT ATRIAL APPENDAGE OCCLUSION;  Surgeon: Wonda Sharper, MD;  Location: [facility] INVASIVE CV LAB;  Service: Cardiovascular;  Laterality: N/A;   PILONIDAL CYST EXCISION  1997   SPINE SURGERY     TEE WITHOUT CARDIOVERSION N/A 02/02/2023   Procedure: TRANSESOPHAGEAL ECHOCARDIOGRAM;  Surgeon: Wonda Sharper, MD;  Location: [facility] INVASIVE CV LAB;  Service: Cardiovascular;  Laterality: N/A;   TOE AMPUTATION Right    3 toes on right foot- big toe and next 2   UMBILICAL HERNIA REPAIR     VARICOSE VEIN SURGERY     left leg   watchman implant     Family History  Problem Relation Age of Onset   Hypertension Mother    Stroke Mother    Diabetes Father    Hypertension Father    Heart disease Father    Diabetes Brother    Colon cancer Neg Hx    Social History   Socioeconomic History   Marital status: Married    Spouse name: Mitchell Parker   Number of children: 2   Years of education: Not on file   Highest education level: Some college, no degree  Occupational History   Occupation: retired  Tobacco Use   Smoking status: Former    Current packs/day: 0.00    Average packs/day: 2.0 packs/day for 25.0 years (50.0 ttl pk-yrs)    Types: Cigarettes    Start date: 12/26/1969    Quit date: 12/26/1994    Years since quitting: 29.8   Smokeless tobacco: Never  Vaping Use   Vaping status: Never Used  Substance  and Sexual Activity   Alcohol use: No   Drug use: No   Sexual activity: Not Currently  Other Topics Concern   Not on file  Social History Narrative   Lives at home with wife, and has two adult children. Still ambulatory, no cane or walker.    Works 30 hours per week at [employer]   Has [facility] benefits - goes to [place] once or twice a year for vision, hearing, screenings, vaccines, etc.   Social Drivers of Health   Financial Resource Strain: Low Risk  (10/22/2024)   Overall Financial Resource Strain (CARDIA)    Difficulty of Paying Living Expenses: Not hard at all  Food Insecurity: No Food Insecurity (10/22/2024)   Hunger Vital Sign    Worried About Running Out of Food in the Last Year: Never true    Ran Out of Food in the Last Year: Never true  Transportation Needs: No Transportation Needs (10/22/2024)   PRAPARE - Administrator, Civil Service (Medical): No    Lack of Transportation (Non-Medical): No  Physical Activity: Insufficiently Active (10/22/2024)   Exercise Vital Sign    Days of Exercise per Week: 2 days    Minutes of Exercise per Session: 30 min  Stress: No Stress Concern Present (10/22/2024)   Mitchell Parker of Occupational Health - Occupational Stress Questionnaire    Feeling of Stress: Not at all  Social Connections: Socially Integrated (10/22/2024)   Social Connection and Isolation Panel    Frequency of Communication with Friends and Family: Twice a week    Frequency of Social Gatherings with Friends and Family: Once a week    Attends Religious Services: More than 4 times per year    Active Member of [employer] or Organizations: Yes    Attends Engineer, Structural: More than 4 times per year    Marital Status: Married    Tobacco Counseling Counseling given: Yes    Clinical Intake:  Pre-visit preparation completed: Yes  Pain : No/denies pain     BMI - recorded: 42.61 Nutritional Status: BMI > 30  Obese Nutritional Risks: None Diabetes:  Yes  Lab Results  Component Value Date   HGBA1C 6.1 (H) 07/29/2024   HGBA1C 5.5 04/22/2024   HGBA1C 5.4  01/18/2024     How often do you need to have someone help you when you read instructions, pamphlets, or other written materials from your doctor or pharmacy?: 1 - Never  Interpreter Needed?: No  Information entered by :: alia t/cma   Activities of Daily Living     10/22/2024    2:30 PM 03/28/2024    7:49 AM  In your present state of health, do you have any difficulty performing the following activities:  Hearing? 0 1  Vision? 0 0  Difficulty concentrating or making decisions? 0 0  Walking or climbing stairs? 0   Dressing or bathing? 0   Doing errands, shopping? 0 0  Preparing Food and eating ? N   Using the Toilet? N   In the past six months, have you accidently leaked urine? N   Do you have problems with loss of bowel control? N   Managing your Medications? N   Managing your Finances? N   Housekeeping or managing your Housekeeping? N     Patient Care Team: Zollie Lowers, MD as PCP - General (Family Medicine) Debera Jayson MATSU, MD as PCP - Cardiology (Cardiology) Debby Dorn MATSU, MD as Consulting Physician (Neurosurgery) Clinic, [facility]ien (Ophthalmology)  I have updated your Care Teams any recent Medical Services you may have received from other providers in the past year.     Assessment:   This is a routine wellness examination for Mitchell Parker.  Hearing/Vision screen Hearing Screening - Comments:: Pt wear hearing aids Vision Screening - Comments:: Pt wear glasses/pt goes to [place]/upcoming apt in Jan. 2026   Goals Addressed             This Visit's Progress    Exercise 150 min/wk Moderate Activity   On track      Depression Screen     10/22/2024    2:32 PM 07/29/2024    8:51 AM 01/18/2024    8:12 AM 10/19/2023    4:01 PM 10/05/2023    9:44 AM 09/12/2023    3:09 PM 08/24/2023    7:57 AM  PHQ 2/9 Scores  PHQ - 2 Score 0 0 0 0 0 1 0   PHQ- 9 Score   0 0  2 3    Fall Risk     10/22/2024    2:28 PM 07/29/2024    8:51 AM 10/19/2023    3:51 PM 10/16/2023   11:21 AM 10/05/2023    9:44 AM  Fall Risk   Falls in the past year? 0 0 1 1 1   Number falls in past yr: 0  0 0 1  Injury with Fall? 0  1 0 0  Comment   Brain injury. followed by mewdical attention    Risk for fall due to : No Fall Risks    History of fall(s)  Follow up Falls evaluation completed;Education provided  Falls prevention discussed  Falls evaluation completed    MEDICARE RISK AT HOME:  Medicare Risk at Home Any stairs in or around the home?: Yes If so, are there any without handrails?: Yes Home free of loose throw rugs in walkways, pet beds, electrical cords, etc?: Yes Adequate lighting in your home to reduce risk of falls?: Yes Life alert?: No Use of a cane, walker or w/c?: No Grab bars in the bathroom?: Yes Shower chair or bench in shower?: No Elevated toilet seat or a handicapped toilet?: Yes  TIMED UP AND GO:  Was the test performed?  no  Cognitive Function: 6CIT completed        10/22/2024    2:33 PM 10/19/2023    3:53 PM 10/06/2022   10:50 AM 04/30/2019    2:41 PM  6CIT Screen  What Year? 0 points 0 points 0 points 0 points  What month? 0 points 0 points 0 points 0 points  What time? 0 points 0 points 0 points 0 points  Count back from 20 0 points 0 points 0 points 0 points  Months in reverse 0 points 0 points 0 points 0 points  Repeat phrase 0 points 0 points 0 points 0 points  Total Score 0 points 0 points 0 points 0 points    Immunizations Immunization History  Administered Date(s) Administered   Fluad Quad(high Dose 65+) 10/10/2019, 09/28/2022   Fluad Trivalent(High Dose 65+) 09/12/2023   H1N1 09/25/2008   INFLUENZA, HIGH DOSE SEASONAL PF 09/27/2018, 10/07/2019, 10/08/2019, 09/10/2020, 10/18/2021   Influenza Split 10/17/2011, 10/09/2012, 03/16/2015   Influenza,inj,Quad PF,6+ Mos 09/21/2017   Influenza-Unspecified  03/14/2005, 12/08/2005, 09/25/2006, 09/26/2007, 09/25/2008, 11/30/2009, 08/26/2010, 09/26/2011, 08/26/2012, 09/25/2013, 09/26/2015, 09/25/2016, 09/25/2017, 09/09/2018, 09/27/2018, 10/10/2019   Moderna Covid-19 Fall Seasonal Vaccine 64yrs & older 12/15/2022, 09/13/2023   Moderna Covid-19 Vaccine Bivalent Booster 62yrs & up 10/18/2021   Moderna Sars-Covid-2 Vaccination 02/06/2020, 03/05/2020, 10/20/2020   Novel Infuenza-h1n1-09 10/14/2008   PNEUMOCOCCAL CONJUGATE-20 04/05/2023   Pneumococcal Conjugate-13 06/17/2016, 09/27/2018, 10/10/2018   Pneumococcal Polysaccharide-23 12/23/2010, 05/08/2013   Pneumococcal-Unspecified 03/14/2005   Respiratory Syncytial Virus Vaccine,Recomb Aduvanted(Arexvy) 12/15/2022   Tdap 03/13/2013, 05/08/2013, 02/03/2022, 02/07/2024   Zoster Recombinant(Shingrix) 10/20/2020, 02/10/2021, 03/23/2022   Zoster, Live 05/08/2013    Screening Tests Health Maintenance  Topic Date Due   FOOT EXAM  Never done   COVID-19 Vaccine (7 - 2025-26 season) 08/26/2024   Influenza Vaccine  03/25/2025 (Originally 07/26/2024)   Colonoscopy  04/22/2025 (Originally 12/26/2022)   OPHTHALMOLOGY EXAM  01/02/2025   HEMOGLOBIN A1C  01/29/2025   Diabetic kidney evaluation - eGFR measurement  07/29/2025   Diabetic kidney evaluation - Urine ACR  07/29/2025   Medicare Annual Wellness (AWV)  10/22/2025   DTaP/Tdap/Td (5 - Td or Tdap) 02/06/2034   Pneumococcal Vaccine: 50+ Years  Completed   Hepatitis C Screening  Completed   Zoster Vaccines- Shingrix  Completed   Meningococcal B Vaccine  Aged Out    Health Maintenance Items Addressed: See Nurse Notes at the end of this note  Additional Screening:  Vision Screening: Recommended annual ophthalmology exams for early detection of glaucoma and other disorders of the eye. Is the patient up to date with their annual eye exam?  Yes  Who is the provider or what is the name of the [facility] in which the patient [facility]  [place] [place]  Dental Screening: Recommended annual dental exams for proper oral hygiene  Community Resource Referral / Chronic Care Management: CRR required this visit?  No   CCM required this visit?  No   Plan:    I have personally reviewed and noted the following in the patient's chart:   Medical and social history Use of alcohol, tobacco or illicit drugs  Current medications and supplements including opioid prescriptions. Patient is not currently taking opioid prescriptions. Functional ability and status Nutritional status Physical activity Advanced directives List of other physicians Hospitalizations, surgeries, and ER visits in previous 12 months Vitals Screenings to include cognitive, depression, and falls Referrals and appointments  In addition, I have reviewed and discussed with patient certain preventive protocols, quality  metrics, and best practice recommendations. A written personalized care plan for preventive services as well as general preventive health recommendations were provided to patient.   Ozie Ned, CMA   10/22/2024   After Visit Summary: (MyChart) Due to this being a telephonic visit, the after visit summary with patients personalized plan was offered to patient via MyChart   Notes: Nothing significant to report at this time.

## 2024-10-22 NOTE — Patient Instructions (Signed)
 Mr. Picotte,  Thank you for taking the time for your Medicare Wellness Visit. I appreciate your continued commitment to your health goals. Please review the care plan we discussed, and feel free to reach out if I can assist you further.  Medicare recommends these wellness visits once per year to help you and your care team stay ahead of potential health issues. These visits are designed to focus on prevention, allowing your provider to concentrate on managing your acute and chronic conditions during your regular appointments.  Please note that Annual Wellness Visits do not include a physical exam. Some assessments may be limited, especially if the visit was conducted virtually. If needed, we may recommend a separate in-person follow-up with your provider.  Ongoing Care Seeing your primary care provider every 3 to 6 months helps us  monitor your health and provide consistent, personalized care.   Referrals If a referral was made during today's visit and you haven't received any updates within two weeks, please contact the referred provider directly to check on the status.  Recommended Screenings:  Health Maintenance  Topic Date Due   Complete foot exam   Never done   COVID-19 Vaccine (7 - 2025-26 season) 08/26/2024   Medicare Annual Wellness Visit  10/18/2024   Flu Shot  03/25/2025*   Colon Cancer Screening  04/22/2025*   Eye exam for diabetics  01/02/2025   Hemoglobin A1C  01/29/2025   Yearly kidney function blood test for diabetes  07/29/2025   Yearly kidney health urinalysis for diabetes  07/29/2025   DTaP/Tdap/Td vaccine (5 - Td or Tdap) 02/06/2034   Pneumococcal Vaccine for age over 30  Completed   Hepatitis C Screening  Completed   Zoster (Shingles) Vaccine  Completed   Meningitis B Vaccine  Aged Out  *Topic was postponed. The date shown is not the original due date.       10/22/2024    2:31 PM  Advanced Directives  Does Patient Have a Medical Advance Directive? Yes  Type of  Advance Directive Living will   Advance Care Planning is important because it: Ensures you receive medical care that aligns with your values, goals, and preferences. Provides guidance to your family and loved ones, reducing the emotional burden of decision-making during critical moments.  Vision: Annual vision screenings are recommended for early detection of glaucoma, cataracts, and diabetic retinopathy. These exams can also reveal signs of chronic conditions such as diabetes and high blood pressure.  Dental: Annual dental screenings help detect early signs of oral cancer, gum disease, and other conditions linked to overall health, including heart disease and diabetes.  Please see the attached documents for additional preventive care recommendations.

## 2024-10-31 ENCOUNTER — Ambulatory Visit: Payer: Self-pay | Admitting: Family Medicine

## 2024-10-31 ENCOUNTER — Encounter: Payer: Self-pay | Admitting: Family Medicine

## 2024-10-31 ENCOUNTER — Ambulatory Visit (INDEPENDENT_AMBULATORY_CARE_PROVIDER_SITE_OTHER): Payer: Self-pay | Admitting: Family Medicine

## 2024-10-31 VITALS — BP 134/64 | HR 87 | Temp 97.8°F | Ht 73.0 in | Wt 325.0 lb

## 2024-10-31 DIAGNOSIS — N400 Enlarged prostate without lower urinary tract symptoms: Secondary | ICD-10-CM

## 2024-10-31 DIAGNOSIS — Z23 Encounter for immunization: Secondary | ICD-10-CM

## 2024-10-31 DIAGNOSIS — E1169 Type 2 diabetes mellitus with other specified complication: Secondary | ICD-10-CM

## 2024-10-31 DIAGNOSIS — I509 Heart failure, unspecified: Secondary | ICD-10-CM

## 2024-10-31 DIAGNOSIS — I10 Essential (primary) hypertension: Secondary | ICD-10-CM

## 2024-10-31 DIAGNOSIS — E785 Hyperlipidemia, unspecified: Secondary | ICD-10-CM

## 2024-10-31 DIAGNOSIS — N1832 Chronic kidney disease, stage 3b: Secondary | ICD-10-CM

## 2024-10-31 DIAGNOSIS — K5900 Constipation, unspecified: Secondary | ICD-10-CM

## 2024-10-31 DIAGNOSIS — E1122 Type 2 diabetes mellitus with diabetic chronic kidney disease: Secondary | ICD-10-CM | POA: Diagnosis not present

## 2024-10-31 DIAGNOSIS — Z7985 Long-term (current) use of injectable non-insulin antidiabetic drugs: Secondary | ICD-10-CM

## 2024-10-31 DIAGNOSIS — E782 Mixed hyperlipidemia: Secondary | ICD-10-CM

## 2024-10-31 LAB — BAYER DCA HB A1C WAIVED: HB A1C (BAYER DCA - WAIVED): 6.6 % — ABNORMAL HIGH (ref 4.8–5.6)

## 2024-10-31 LAB — LIPID PANEL

## 2024-10-31 MED ORDER — POTASSIUM CHLORIDE CRYS ER 20 MEQ PO TBCR: EXTENDED_RELEASE_TABLET | ORAL | 0 refills | Status: AC

## 2024-10-31 MED ORDER — AMLODIPINE-OLMESARTAN 10-40 MG PO TABS: 1.0000 | ORAL_TABLET | Freq: Every day | ORAL | 0 refills | Status: AC

## 2024-10-31 MED ORDER — TAMSULOSIN HCL 0.4 MG PO CAPS: 0.8000 mg | ORAL_CAPSULE | Freq: Every day | ORAL | 3 refills | Status: AC

## 2024-10-31 MED ORDER — LINACLOTIDE 145 MCG PO CAPS: 145.0000 ug | ORAL_CAPSULE | Freq: Every day | ORAL | 1 refills | Status: AC

## 2024-10-31 MED ORDER — CARVEDILOL 6.25 MG PO TABS: ORAL_TABLET | ORAL | 0 refills | Status: DC

## 2024-10-31 MED ORDER — CALCITRIOL 0.25 MCG PO CAPS: 0.2500 ug | ORAL_CAPSULE | Freq: Every day | ORAL | 0 refills | Status: AC

## 2024-10-31 MED ORDER — CLONIDINE HCL 0.2 MG PO TABS: 0.2000 mg | ORAL_TABLET | Freq: Two times a day (BID) | ORAL | 0 refills | Status: DC

## 2024-10-31 MED ORDER — ATORVASTATIN CALCIUM 80 MG PO TABS: 80.0000 mg | ORAL_TABLET | Freq: Every day | ORAL | 0 refills | Status: DC

## 2024-10-31 MED ORDER — FUROSEMIDE 40 MG PO TABS
40.0000 mg | ORAL_TABLET | Freq: Every morning | ORAL | 0 refills | Status: AC
Start: 2024-10-31 — End: ?

## 2024-10-31 NOTE — Progress Notes (Signed)
 Subjective:  Patient ID: Mitchell JONELLE Dann Chrystal., male    DOB: 10/27/1951  Age: 73 y.o. MRN: [id number]  CC: Medical Management of Chronic Issues   HPI  Discussed the use of AI scribe software for clinical note transcription with the patient, who gave verbal consent to proceed.  History of Present Illness Mitchell Parker is a 73 year old male who presents with alternating constipation and diarrhea.  He has been experiencing alternating episodes of constipation and diarrhea for the past few months. The diarrhea is more frequent, but constipation typically precedes it, lasting a couple of days before severe stomach cramps lead to explosive diarrhea. He recalls an incident at [employer] where he barely made it to his car before an episode occurred. Current treatments with docusate, Dulcolax, and psyllium, prescribed by his GI doctor at the [facility], have not been effective.  He has diabetes, with a recent A1c of 6.6, which has increased slightly since his last test. He attributes this change to switching from diabetic pills to Ozempic , which he cannot increase due to side effects at higher doses.  He recently had a superficial infection on his leg, treated with antibiotics at the [facility] following an x-ray and ultrasound. The infection has nearly resolved.  He experiences right knee pain, for which he takes Tylenol  regularly. He currently takes two 500 mg tablets a day. He reports stomach rumbling.          10/31/2024    9:03 AM 10/22/2024    2:32 PM 07/29/2024    8:51 AM  Depression screen PHQ 2/9  Decreased Interest 0 0 0  Down, Depressed, Hopeless 0 0 0  PHQ - 2 Score 0 0 0  Altered sleeping 0    Tired, decreased energy 0    Change in appetite 0    Feeling bad or failure about yourself  0    Trouble concentrating 0    Moving slowly or fidgety/restless 0    Suicidal thoughts 0    PHQ-9 Score 0    Difficult doing work/chores Not difficult at all      History Mitchell Parker has a past  medical history of Allergy (oct 2002), Cataract (2013), CHF (congestive heart failure) (HCC) (2002), Chronic diarrhea, CKD (chronic kidney disease) stage 3, GFR 30-59 ml/min (HCC), COPD (chronic obstructive pulmonary disease) (HCC), Dysrhythmia (02/2022), Essential hypertension, Fibromyalgia, Gallstones, Gout, History of CHF (congestive heart failure), History of colonic polyps, History of DVT (deep vein thrombosis) (04/19/2012), Iron deficiency (02/16/2023), Kidney stones, Lumbar disc disease, Neuropathy, Nonischemic cardiomyopathy (HCC) (2003), Obesity, Orthostatic hypotension, Presence of Watchman left atrial appendage closure device (02/02/2023), RA (rheumatoid arthritis) (HCC), Sleep apnea, Subarachnoid hemorrhage (HCC) (02/03/2022), Subdural hematoma (HCC) (02/03/2022), Syncope, vasovagal, and Type 2 diabetes mellitus (HCC).   He has a past surgical history that includes Umbilical hernia repair; Pilonidal cyst excision (1997); Varicose vein surgery; Cholecystectomy (01/18/2012); Bariatric Surgery (01/11/2018); Eye surgery (Right); [facility] (Right, 05/04/2022); Toe amputation (Right); LEFT ATRIAL APPENDAGE OCCLUSION (N/A, 02/02/2023); TEE without cardioversion (N/A, 02/02/2023); Colonoscopy (05/2017); watchman implant; I & D extremity (Right, 08/12/2023); Anterior cervical decomp/discectomy fusion (Left, 03/28/2024); Hernia repair; Brain surgery; and Spine surgery.   His family history includes Diabetes in his brother and father; Heart disease in his father; Hypertension in his father and mother; Stroke in his mother.He reports that he quit smoking about 29 years ago. His smoking use included cigarettes. He started smoking about 54 years ago. He has a 50 pack-year smoking history. He  has never used smokeless tobacco. He reports that he does not drink alcohol and does not use drugs.    ROS Review of Systems  Constitutional: Negative.   HENT: Negative.    Eyes:  Negative for visual disturbance.   Respiratory:  Negative for cough and shortness of breath.   Cardiovascular:  Negative for chest pain and leg swelling.  Gastrointestinal:  Negative for abdominal pain, diarrhea, nausea and vomiting.  Genitourinary:  Negative for difficulty urinating.  Musculoskeletal:  Negative for arthralgias and myalgias.  Skin:  Negative for rash.  Neurological:  Negative for headaches.  Psychiatric/Behavioral:  Negative for sleep disturbance.     Objective:  BP 134/64   Pulse 87   Temp 97.8 F (36.6 C)   Ht 6' 1 (1.854 m)   Wt (!) 325 lb (147.4 kg)   SpO2 100%   BMI 42.88 kg/m   BP Readings from Last 3 Encounters:  10/31/24 134/64  10/22/24 (!) 140/70  10/17/24 (!) 140/70    Wt Readings from Last 3 Encounters:  10/31/24 (!) 325 lb (147.4 kg)  10/22/24 (!) 323 lb (146.5 kg)  10/17/24 (!) 323 lb 9.6 oz (146.8 kg)     Physical Exam Physical Exam GENERAL: Alert, cooperative, well developed, no acute distress HEENT: Normocephalic, normal oropharynx, moist mucous membranes CHEST: Clear to auscultation bilaterally, no wheezes, rhonchi, or crackles CARDIOVASCULAR: Normal heart rate and rhythm, S1 and S2 normal without murmurs ABDOMEN: Soft, non-tender, non-distended, without organomegaly, normal bowel sounds EXTREMITIES: No cyanosis or edema NEUROLOGICAL: Cranial nerves grossly intact, moves all extremities without gross motor or sensory deficit SKIN: Resolved superficial infection on leg   Assessment & Plan:  Primary hypertension -     CBC with Differential/Platelet  Type 2 diabetes mellitus with stage 3b chronic kidney disease, without long-term current use of insulin  (HCC) -     Bayer DCA Hb A1c Waived  Hyperlipidemia associated with type 2 diabetes mellitus (HCC) -     Comprehensive metabolic panel with GFR -     Lipid panel  Obesity, morbid (HCC) -     CBC with Differential/Platelet -     Comprehensive metabolic panel with GFR  Chronic congestive heart failure,  unspecified heart failure type (HCC)  Mixed hyperlipidemia  Benign prostatic hyperplasia without lower urinary tract symptoms  Encounter for immunization -     Flu vaccine HIGH DOSE PF(Fluzone Trivalent)    Assessment and Plan Assessment & Plan Chronic constipation with intermittent diarrhea   He has experienced chronic constipation with intermittent diarrhea for at least a couple of months. Current treatment with DocuKate and Faselium is ineffective. Diarrhea is more common and likely results from constipation buildup. A previous GI consultation at the [facility] was unsatisfactory due to communication issues. Linzess is prescribed once daily to alleviate constipation, with potential initial side effects of diarrhea. Dosage adjustments may be necessary based on response: increase if constipation persists, decrease if diarrhea becomes frequent. Miralax  is recommended twice daily as a temporary measure until Linzess is available. He is referred to a GI specialist for further evaluation.  Type 2 diabetes mellitus   His type 2 diabetes is well-controlled with an A1c of 6.6. A previous increase in Ozempic  dosage led to adverse effects, so the current dosage is maintained.  Essential hypertension   His essential hypertension is well-controlled with current management, which will be continued.  Mixed hyperlipidemia   His mixed hyperlipidemia is managed with current treatment. He reports right knee pain, which is not  attributed to medication. Current cholesterol management will continue, and Tylenol  is recommended for knee pain as needed, up to six 500 mg tablets per day.       Follow-up: No follow-ups on file.  Butler Der, M.D.

## 2024-11-01 ENCOUNTER — Encounter: Payer: Self-pay | Admitting: Family Medicine

## 2024-11-01 LAB — COMPREHENSIVE METABOLIC PANEL WITH GFR
ALT: 32 IU/L (ref 0–44)
AST: 27 IU/L (ref 0–40)
Albumin: 4.2 g/dL (ref 3.8–4.8)
Alkaline Phosphatase: 51 IU/L (ref 47–123)
BUN/Creatinine Ratio: 15 (ref 10–24)
BUN: 16 mg/dL (ref 8–27)
Bilirubin Total: 0.9 mg/dL (ref 0.0–1.2)
CO2: 24 mmol/L (ref 20–29)
Calcium: 9.5 mg/dL (ref 8.6–10.2)
Chloride: 102 mmol/L (ref 96–106)
Creatinine, Ser: 1.09 mg/dL (ref 0.76–1.27)
Globulin, Total: 2.4 g/dL (ref 1.5–4.5)
Glucose: 136 mg/dL — AB (ref 70–99)
Potassium: 4.7 mmol/L (ref 3.5–5.2)
Sodium: 138 mmol/L (ref 134–144)
Total Protein: 6.6 g/dL (ref 6.0–8.5)
eGFR: 73 mL/min/1.73 (ref >59)

## 2024-11-01 LAB — LIPID PANEL
Cholesterol, Total: 102 mg/dL (ref 100–199)
HDL: 31 mg/dL — AB (ref >39)
LDL CALC COMMENT:: 3.3 ratio (ref 0.0–5.0)
LDL Chol Calc (NIH): 50 mg/dL (ref 0–99)
Triglycerides: 112 mg/dL (ref 0–149)
VLDL Cholesterol Cal: 21 mg/dL (ref 5–40)

## 2024-11-01 LAB — CBC WITH DIFFERENTIAL/PLATELET
Basophils Absolute: 0 x10E3/uL (ref 0.0–0.2)
Basos: 1 %
EOS (ABSOLUTE): 0.2 x10E3/uL (ref 0.0–0.4)
Eos: 2 %
Hematocrit: 43.4 % (ref 37.5–51.0)
Hemoglobin: 14.5 g/dL (ref 13.0–17.7)
Immature Grans (Abs): 0 x10E3/uL (ref 0.0–0.1)
Immature Granulocytes: 0 %
Lymphocytes Absolute: 1.1 x10E3/uL (ref 0.7–3.1)
Lymphs: 18 %
MCH: 32.4 pg (ref 26.6–33.0)
MCHC: 33.4 g/dL (ref 31.5–35.7)
MCV: 97 fL (ref 79–97)
Monocytes Absolute: 0.6 x10E3/uL (ref 0.1–0.9)
Monocytes: 9 %
Neutrophils Absolute: 4.4 x10E3/uL (ref 1.4–7.0)
Neutrophils: 70 %
Platelets: 112 x10E3/uL — ABNORMAL LOW (ref 150–450)
RBC: 4.48 x10E6/uL (ref 4.14–5.80)
RDW: 13.1 % (ref 11.6–15.4)
WBC: 6.3 x10E3/uL (ref 3.4–10.8)

## 2024-11-04 NOTE — Progress Notes (Signed)
 Hello Dmetrius,  Your lab result is normal and/or stable.Some minor variations that are not significant are commonly marked abnormal, but do not represent any medical problem for you.  Best regards, [name], M.D.

## 2024-11-04 NOTE — Progress Notes (Signed)
 Pharmacy Quality Measure Review  This patient is appearing on a report for being at risk of failing the adherence measure for cholesterol (statin) medications this calendar year.   Medication: atorvastatin  80 mg Last fill date: 10/31/24 for 100 day supply  Insurance report was not up to date. No action needed at this time. Appointment with PCP prior to next fill due.   Woodie Jock, PharmD PGY1 Pharmacy Resident  11/04/2024

## 2024-11-28 ENCOUNTER — Telehealth: Payer: Self-pay

## 2024-11-28 NOTE — Telephone Encounter (Signed)
 Looks like [facility] tried reaching out to patient about his Ozempic  coverage on 10/18/2024. He is requesting to speak with you Mliss.

## 2024-11-28 NOTE — Telephone Encounter (Signed)
 Copied from CRM [id number]. Topic: Clinical - Medication Question >> Nov 28, 2024  1:36 PM Wess RAMAN wrote: Reason for CRM: Patient would like a call from Billee Mliss BIRCH, [facility], pharmacist at [facility], to discuss his ozempic   Callback #: [phone]

## 2024-11-29 NOTE — Telephone Encounter (Signed)
 Did he ever get refills? I'm showing a [employer] insurance now too!

## 2024-12-03 ENCOUNTER — Other Ambulatory Visit (HOSPITAL_COMMUNITY): Payer: Self-pay

## 2024-12-03 ENCOUNTER — Encounter: Payer: Self-pay | Admitting: Gastroenterology

## 2024-12-03 NOTE — Telephone Encounter (Unsigned)
 Copied from CRM [id number]. Topic: General - Other >> Dec 03, 2024 12:23 PM Santiya F wrote: Reason for CRM: Patient is calling in because he would like to know if his Ozempic  was in the fridge at the office for him. Please follow up with patient.

## 2024-12-05 ENCOUNTER — Telehealth: Payer: Self-pay

## 2024-12-05 NOTE — Telephone Encounter (Signed)
 Attempted to call patient to see how he is doing almost 2 year post LAAO (closed 02/02/2023). Voicemail was full. Will try again later.

## 2024-12-06 ENCOUNTER — Other Ambulatory Visit: Payer: Self-pay | Admitting: Family Medicine

## 2024-12-06 DIAGNOSIS — E1169 Type 2 diabetes mellitus with other specified complication: Secondary | ICD-10-CM

## 2024-12-06 DIAGNOSIS — I10 Essential (primary) hypertension: Secondary | ICD-10-CM

## 2024-12-06 DIAGNOSIS — N1832 Chronic kidney disease, stage 3b: Secondary | ICD-10-CM

## 2024-12-06 DIAGNOSIS — I509 Heart failure, unspecified: Secondary | ICD-10-CM

## 2024-12-06 DIAGNOSIS — E782 Mixed hyperlipidemia: Secondary | ICD-10-CM

## 2024-12-11 NOTE — Telephone Encounter (Signed)
 Attempted to call patient a second time. Voicemail was still full.

## 2024-12-23 NOTE — Telephone Encounter (Signed)
 Attempted to call patient 3rd time. No voicemail available. Will mail a letter.

## 2024-12-29 ENCOUNTER — Encounter: Payer: Self-pay | Admitting: Family Medicine

## 2024-12-31 DIAGNOSIS — N184 Chronic kidney disease, stage 4 (severe): Secondary | ICD-10-CM

## 2024-12-31 MED ORDER — PIOGLITAZONE HCL 30 MG PO TABS: 30.0000 mg | ORAL_TABLET | Freq: Every day | ORAL | 0 refills | Status: AC

## 2024-12-31 MED ORDER — GLIMEPIRIDE 2 MG PO TABS: 2.0000 mg | ORAL_TABLET | ORAL | 0 refills | Status: AC

## 2024-12-31 NOTE — Progress Notes (Signed)
 Rx for Pioglitazone  30mg  and Glimepiride  2mg  sent to your pharmacy

## 2025-01-01 NOTE — Telephone Encounter (Signed)
 Called to check in with patient, who had LAAO on 02/02/2023. The patient reports doing well with no issues.  The patient understands to call with questions or concerns.

## 2025-01-04 ENCOUNTER — Encounter: Payer: Self-pay | Admitting: Oncology

## 2025-01-27 ENCOUNTER — Encounter: Payer: Self-pay | Admitting: Oncology

## 2025-02-03 ENCOUNTER — Ambulatory Visit: Admitting: Family Medicine

## 2025-02-05 ENCOUNTER — Ambulatory Visit: Admitting: Family Medicine
